# Patient Record
Sex: Male | Born: 1950 | Race: Black or African American | Hispanic: No | Smoking: Former smoker
Health system: Southern US, Community
[De-identification: ages and names within clinical notes are randomized; demographics above are authoritative.]

## PROBLEM LIST (undated history)

## (undated) DIAGNOSIS — I471 Supraventricular tachycardia, unspecified: Secondary | ICD-10-CM

## (undated) DIAGNOSIS — I6522 Occlusion and stenosis of left carotid artery: Secondary | ICD-10-CM

## (undated) DIAGNOSIS — K219 Gastro-esophageal reflux disease without esophagitis: Secondary | ICD-10-CM

## (undated) DIAGNOSIS — K56609 Unspecified intestinal obstruction, unspecified as to partial versus complete obstruction: Secondary | ICD-10-CM

## (undated) DIAGNOSIS — I252 Old myocardial infarction: Secondary | ICD-10-CM

## (undated) DIAGNOSIS — E119 Type 2 diabetes mellitus without complications: Secondary | ICD-10-CM

## (undated) DIAGNOSIS — E785 Hyperlipidemia, unspecified: Secondary | ICD-10-CM

## (undated) DIAGNOSIS — I639 Cerebral infarction, unspecified: Secondary | ICD-10-CM

## (undated) DIAGNOSIS — I739 Peripheral vascular disease, unspecified: Secondary | ICD-10-CM

## (undated) DIAGNOSIS — I2510 Atherosclerotic heart disease of native coronary artery without angina pectoris: Secondary | ICD-10-CM

## (undated) DIAGNOSIS — Z9181 History of falling: Secondary | ICD-10-CM

## (undated) DIAGNOSIS — Z87828 Personal history of other (healed) physical injury and trauma: Secondary | ICD-10-CM

## (undated) DIAGNOSIS — N433 Hydrocele, unspecified: Secondary | ICD-10-CM

## (undated) DIAGNOSIS — Z8673 Personal history of transient ischemic attack (TIA), and cerebral infarction without residual deficits: Secondary | ICD-10-CM

## (undated) DIAGNOSIS — N400 Enlarged prostate without lower urinary tract symptoms: Secondary | ICD-10-CM

## (undated) HISTORY — DX: Cerebral infarction, unspecified: I63.9

## (undated) HISTORY — DX: Hyperlipidemia, unspecified: E78.5

## (undated) HISTORY — DX: Type 2 diabetes mellitus without complications: E11.9

## (undated) HISTORY — PX: CARDIAC CATHETERIZATION: SHX172

---

## 1991-12-11 HISTORY — PX: CORONARY ANGIOPLASTY: SHX604

## 2000-01-30 ENCOUNTER — Ambulatory Visit (HOSPITAL_COMMUNITY): Admission: RE | Admit: 2000-01-30 | Discharge: 2000-01-30 | Payer: Self-pay | Admitting: Internal Medicine

## 2000-01-30 ENCOUNTER — Encounter: Payer: Self-pay | Admitting: Internal Medicine

## 2000-02-08 ENCOUNTER — Encounter: Payer: Self-pay | Admitting: Emergency Medicine

## 2000-02-08 ENCOUNTER — Emergency Department (HOSPITAL_COMMUNITY): Admission: EM | Admit: 2000-02-08 | Discharge: 2000-02-08 | Payer: Self-pay | Admitting: Emergency Medicine

## 2000-03-13 ENCOUNTER — Ambulatory Visit (HOSPITAL_COMMUNITY): Admission: RE | Admit: 2000-03-13 | Discharge: 2000-03-13 | Payer: Self-pay | Admitting: Neurosurgery

## 2000-03-13 ENCOUNTER — Encounter: Payer: Self-pay | Admitting: Neurosurgery

## 2000-04-01 ENCOUNTER — Encounter: Admission: RE | Admit: 2000-04-01 | Discharge: 2000-06-30 | Payer: Self-pay | Admitting: Neurosurgery

## 2000-06-20 ENCOUNTER — Ambulatory Visit (HOSPITAL_COMMUNITY): Admission: RE | Admit: 2000-06-20 | Discharge: 2000-06-20 | Payer: Self-pay | Admitting: *Deleted

## 2000-06-20 ENCOUNTER — Encounter: Payer: Self-pay | Admitting: *Deleted

## 2000-08-28 ENCOUNTER — Ambulatory Visit (HOSPITAL_COMMUNITY)
Admission: RE | Admit: 2000-08-28 | Discharge: 2000-08-28 | Payer: Self-pay | Admitting: Physical Medicine and Rehabilitation

## 2002-08-05 ENCOUNTER — Emergency Department (HOSPITAL_COMMUNITY): Admission: EM | Admit: 2002-08-05 | Discharge: 2002-08-05 | Payer: Self-pay | Admitting: Emergency Medicine

## 2002-12-07 ENCOUNTER — Emergency Department (HOSPITAL_COMMUNITY): Admission: EM | Admit: 2002-12-07 | Discharge: 2002-12-07 | Payer: Self-pay | Admitting: Emergency Medicine

## 2002-12-07 ENCOUNTER — Encounter: Payer: Self-pay | Admitting: Emergency Medicine

## 2002-12-10 ENCOUNTER — Emergency Department (HOSPITAL_COMMUNITY): Admission: EM | Admit: 2002-12-10 | Discharge: 2002-12-11 | Payer: Self-pay | Admitting: Emergency Medicine

## 2002-12-29 ENCOUNTER — Encounter: Payer: Self-pay | Admitting: Internal Medicine

## 2002-12-29 ENCOUNTER — Encounter: Admission: RE | Admit: 2002-12-29 | Discharge: 2002-12-29 | Payer: Self-pay | Admitting: Internal Medicine

## 2003-01-04 ENCOUNTER — Ambulatory Visit (HOSPITAL_COMMUNITY): Admission: RE | Admit: 2003-01-04 | Discharge: 2003-01-04 | Payer: Self-pay | Admitting: Surgery

## 2003-01-06 ENCOUNTER — Ambulatory Visit: Admission: AD | Admit: 2003-01-06 | Discharge: 2003-01-06 | Payer: Self-pay | Admitting: Surgery

## 2003-01-06 ENCOUNTER — Encounter: Payer: Self-pay | Admitting: Surgery

## 2003-01-15 ENCOUNTER — Inpatient Hospital Stay (HOSPITAL_COMMUNITY): Admission: EM | Admit: 2003-01-15 | Discharge: 2003-01-26 | Payer: Self-pay | Admitting: Emergency Medicine

## 2003-01-15 ENCOUNTER — Encounter: Payer: Self-pay | Admitting: Surgery

## 2003-01-15 ENCOUNTER — Encounter: Payer: Self-pay | Admitting: Emergency Medicine

## 2003-01-19 ENCOUNTER — Encounter (INDEPENDENT_AMBULATORY_CARE_PROVIDER_SITE_OTHER): Payer: Self-pay | Admitting: *Deleted

## 2003-01-19 HISTORY — PX: FEMORAL-POPLITEAL BYPASS GRAFT: SHX937

## 2003-05-23 ENCOUNTER — Emergency Department (HOSPITAL_COMMUNITY): Admission: EM | Admit: 2003-05-23 | Discharge: 2003-05-23 | Payer: Self-pay

## 2003-05-31 ENCOUNTER — Encounter (INDEPENDENT_AMBULATORY_CARE_PROVIDER_SITE_OTHER): Payer: Self-pay | Admitting: Specialist

## 2003-05-31 ENCOUNTER — Ambulatory Visit (HOSPITAL_BASED_OUTPATIENT_CLINIC_OR_DEPARTMENT_OTHER): Admission: RE | Admit: 2003-05-31 | Discharge: 2003-05-31 | Payer: Self-pay | Admitting: Urology

## 2003-05-31 HISTORY — PX: CAROTID ENDARTERECTOMY: SUR193

## 2003-05-31 HISTORY — PX: OTHER SURGICAL HISTORY: SHX169

## 2005-12-10 HISTORY — PX: CARDIOVASCULAR STRESS TEST: SHX262

## 2006-08-20 ENCOUNTER — Ambulatory Visit: Payer: Self-pay | Admitting: Cardiology

## 2006-08-20 ENCOUNTER — Inpatient Hospital Stay (HOSPITAL_COMMUNITY): Admission: EM | Admit: 2006-08-20 | Discharge: 2006-08-21 | Payer: Self-pay | Admitting: Emergency Medicine

## 2006-08-26 ENCOUNTER — Ambulatory Visit: Payer: Self-pay

## 2006-09-03 ENCOUNTER — Ambulatory Visit: Payer: Self-pay | Admitting: Cardiovascular Disease

## 2006-10-21 ENCOUNTER — Emergency Department (HOSPITAL_COMMUNITY): Admission: EM | Admit: 2006-10-21 | Discharge: 2006-10-21 | Payer: Self-pay | Admitting: Emergency Medicine

## 2006-11-20 ENCOUNTER — Ambulatory Visit: Payer: Self-pay | Admitting: Cardiology

## 2008-06-14 ENCOUNTER — Emergency Department (HOSPITAL_COMMUNITY): Admission: EM | Admit: 2008-06-14 | Discharge: 2008-06-14 | Payer: Self-pay | Admitting: Family Medicine

## 2009-04-02 ENCOUNTER — Emergency Department (HOSPITAL_COMMUNITY): Admission: EM | Admit: 2009-04-02 | Discharge: 2009-04-02 | Payer: Self-pay | Admitting: Family Medicine

## 2010-06-29 ENCOUNTER — Emergency Department (HOSPITAL_COMMUNITY): Admission: EM | Admit: 2010-06-29 | Discharge: 2010-06-29 | Payer: Self-pay | Admitting: Emergency Medicine

## 2010-07-02 ENCOUNTER — Observation Stay (HOSPITAL_COMMUNITY): Admission: EM | Admit: 2010-07-02 | Discharge: 2010-07-03 | Payer: Self-pay | Admitting: Emergency Medicine

## 2010-07-12 ENCOUNTER — Encounter: Payer: Self-pay | Admitting: Cardiology

## 2010-07-27 ENCOUNTER — Encounter: Payer: Self-pay | Admitting: Cardiology

## 2010-07-27 ENCOUNTER — Ambulatory Visit: Payer: Self-pay | Admitting: Vascular Surgery

## 2010-08-08 ENCOUNTER — Encounter: Payer: Self-pay | Admitting: Cardiology

## 2010-08-08 DIAGNOSIS — F17200 Nicotine dependence, unspecified, uncomplicated: Secondary | ICD-10-CM | POA: Insufficient documentation

## 2010-08-08 DIAGNOSIS — E785 Hyperlipidemia, unspecified: Secondary | ICD-10-CM

## 2010-08-08 DIAGNOSIS — I6529 Occlusion and stenosis of unspecified carotid artery: Secondary | ICD-10-CM

## 2010-08-08 DIAGNOSIS — I10 Essential (primary) hypertension: Secondary | ICD-10-CM

## 2010-08-09 ENCOUNTER — Ambulatory Visit: Payer: Self-pay | Admitting: Cardiology

## 2010-08-09 DIAGNOSIS — I359 Nonrheumatic aortic valve disorder, unspecified: Secondary | ICD-10-CM

## 2010-08-09 DIAGNOSIS — I739 Peripheral vascular disease, unspecified: Secondary | ICD-10-CM | POA: Insufficient documentation

## 2010-08-09 DIAGNOSIS — Z8679 Personal history of other diseases of the circulatory system: Secondary | ICD-10-CM | POA: Insufficient documentation

## 2010-08-09 DIAGNOSIS — R002 Palpitations: Secondary | ICD-10-CM | POA: Insufficient documentation

## 2010-08-16 ENCOUNTER — Emergency Department (HOSPITAL_COMMUNITY): Admission: EM | Admit: 2010-08-16 | Discharge: 2010-08-16 | Payer: Self-pay | Admitting: Emergency Medicine

## 2010-08-24 ENCOUNTER — Ambulatory Visit: Payer: Self-pay

## 2010-08-24 ENCOUNTER — Ambulatory Visit: Payer: Self-pay | Admitting: Internal Medicine

## 2010-08-24 ENCOUNTER — Ambulatory Visit (HOSPITAL_COMMUNITY): Admission: RE | Admit: 2010-08-24 | Discharge: 2010-08-24 | Payer: Self-pay | Admitting: Cardiology

## 2010-08-24 ENCOUNTER — Ambulatory Visit: Payer: Self-pay | Admitting: Cardiology

## 2010-08-24 HISTORY — PX: TRANSTHORACIC ECHOCARDIOGRAM: SHX275

## 2010-09-04 ENCOUNTER — Ambulatory Visit: Payer: Self-pay | Admitting: Cardiology

## 2010-09-08 ENCOUNTER — Encounter: Payer: Self-pay | Admitting: Cardiology

## 2010-09-08 ENCOUNTER — Telehealth: Payer: Self-pay | Admitting: Nurse Practitioner

## 2010-09-08 ENCOUNTER — Telehealth (INDEPENDENT_AMBULATORY_CARE_PROVIDER_SITE_OTHER): Payer: Self-pay | Admitting: *Deleted

## 2010-09-11 ENCOUNTER — Ambulatory Visit: Payer: Self-pay | Admitting: Internal Medicine

## 2010-09-15 ENCOUNTER — Ambulatory Visit: Payer: Self-pay | Admitting: Internal Medicine

## 2010-09-15 DIAGNOSIS — I471 Supraventricular tachycardia, unspecified: Secondary | ICD-10-CM | POA: Insufficient documentation

## 2010-09-26 ENCOUNTER — Ambulatory Visit: Payer: Self-pay | Admitting: Cardiology

## 2010-11-08 ENCOUNTER — Encounter: Payer: Self-pay | Admitting: Cardiology

## 2010-11-17 ENCOUNTER — Ambulatory Visit: Payer: Self-pay | Admitting: Internal Medicine

## 2011-01-09 NOTE — Miscellaneous (Signed)
  Clinical Lists Changes  Observations: Added new observation of CARDIO HPI: I have reviewed the echo.  It does show calcification of the right cusp with eccentric aortic insufficiency there is mild.  This explains the murmur.  There is no significant pulmonic insufficiency. (11/08/2010 11:42) Added new observation of PAST MED HX: CAD  urgent PCI  LAD.Marland Kitchen 1993  /   cath  2004...nonobstructive disease  /  nuclear 2007..small antero-apical scar..no ischemia Carotid artery disease   R-CEA in the past  /  now followed by Dr. Darrick Penna Hyperlipidemia Hypertension Tobacco abuse Keloid infected SVT  ... September, 2011.... palpitations.... regular, narrow complex, rate of 150 Aortic insufficiency   physical exam.... August, 2011 /  echo review reveals calcification of the right coronary cusp with eccentric aortic insufficiency that is mild... no significant pulmonic insufficiency Claudication   August, 2011 EF 55%.... echo.... August 24, 2010 LVH    moderate... echo... 2011 (11/08/2010 11:42) Added new observation of REFERRING MD: Allred (11/08/2010 11:42) Added new observation of PRIMARY MD: [name], MD (11/08/2010 11:42)      Referring Geisha Abernathy:  Johney Frame Primary Lillymae Duet:  [name], MD   History of Present Illness: I have reviewed the echo.  It does show calcification of the right cusp with eccentric aortic insufficiency there is mild.  This explains the murmur.  There is no significant pulmonic insufficiency.   Past History:  Past Medical History: CAD  urgent PCI  LAD.Marland Kitchen 1993  /   cath  2004...nonobstructive disease  /  nuclear 2007..small antero-apical scar..no ischemia Carotid artery disease   R-CEA in the past  /  now followed by Dr. Darrick Penna Hyperlipidemia Hypertension Tobacco abuse Keloid infected SVT  ... September, 2011.... palpitations.... regular, narrow complex, rate of 150 Aortic insufficiency   physical exam.... August, 2011 /  echo review reveals  calcification of the right coronary cusp with eccentric aortic insufficiency that is mild... no significant pulmonic insufficiency Claudication   August, 2011 EF 55%.... echo.... August 24, 2010 LVH    moderate... echo... 2011   Referring Samaiya Awadallah:  Allred Primary Melyna Huron:  [name], MD   History of Present Illness: I have reviewed the echo.  It does show calcification of the right cusp with eccentric aortic insufficiency there is mild.  This explains the murmur.  There is no significant pulmonic insufficiency.

## 2011-01-09 NOTE — Procedures (Signed)
Summary: Summary Report  Summary Report   Imported By: Erle Crocker 10/27/2010 16:07:31  _____________________________________________________________________  External Attachment:    Type:   Image     Comment:   External Document

## 2011-01-09 NOTE — Assessment & Plan Note (Signed)
Summary: [facility]   Referring Provider:  Fabienne Bruns, MD Primary Provider:  [name], MD   History of Present Illness: The patient was in the office today for two-dimensional echo.  As he was lying on the table for the echo his heart rate was 141.  EKG was done showing a regular supraventricular tachycardia at a rate of 141.  With very mild left carotid massage the patient broke to sinus rhythm.  I was unable to record the instance when the rhythm broke.  Currently the echo is being done.  Unless the patient has unexpected significant left ventricular dysfunction, he will be started on diltiazem long-acting 180 mg daily.  Allergies: No Known Drug Allergies   Patient Instructions: 1)  Start Diltiazem CD 180mg  daily Prescriptions: DILT-CD 180 MG XR24H-CAP (DILTIAZEM HCL COATED BEADS) Take 1 tablet by mouth once a day  #30 x 6   Entered by:   Meredith Staggers, RN   Authorized by:   Talitha Givens, MD, [facility]   Signed by:   Meredith Staggers, RN on 08/24/2010   Method used:   Electronically to        [facility]. [phone]* (retail)       [address]       [place], [place]  [address]       Ph: [phone]       Fax: [phone]   RxID:   [id number]

## 2011-01-09 NOTE — Assessment & Plan Note (Signed)
Summary: 4 WKS/Nicholas [name]   Visit Type:  Follow-up Referring Provider:  Fabienne Bruns, MD Primary Provider:  [name], MD  CC:  palpitations.  History of Present Illness: The patient is seen for the evaluation of palpitations.  He has known coronary disease.  I saw him in the office on August 09, 2010.  Plan at that time was to arrange for a two-dimensional echo and an event recorder.  He came into the office on August 24, 2010.  To have his echo and have the monitor placed.  As he was positioned for the echo it was noted that he had a heart rate of 150 with a regular narrow complex QRS tachycardia.  Fortunately this converted rapidly with carotid massage.  He was on the echo monitor.  I was not able to record to conversion.  No definite flutter waves were seen.  We did proceed with a 2-D echo.  The event recorder was placed.  Cardizem CD 180 mg was added to his medications.  He's felt well since then.  He been wearing the event recorder.  He had further SVT on the day that I saw him and then with Cardizem no other SVT is seen until today.  He tells me that he became very upset when he couldn't find some papers earlier today.  He is in sinus rhythm now.  Current Medications (verified): 1)  Aspirin Ec 325 Mg Tbec (Aspirin) .... Take One Tablet By Mouth Daily 2)  Pravastatin Sodium 20 Mg Tabs (Pravastatin Sodium) .... Take One Tablet By Mouth Daily At Bedtime 3)  Metoprolol Tartrate 25 Mg Tabs (Metoprolol Tartrate) .... Take One Tablet By Mouth Twice A Day 4)  Dilt-Cd 180 Mg Xr24h-Cap (Diltiazem Hcl Coated Beads) .... Take 1 Tablet By Mouth Once A Day  Allergies (verified): No Known Drug Allergies  Past History:  Past Medical History: CAD  urgent PCI  LAD.Marland Kitchen 1993  /   cath  2004...nonobstructive disease  /  nuclear 2007..small antero-apical scar..no ischemia Carotid artery disease   R-CEA in the past  /  now followed by Dr. Darrick Penna Hyperlipidemia Hypertension Tobacco abuse Keloid  infected SVT  ... September, 2011.... palpitations.... regular, narrow complex, rate of 150 Aortic insufficiency   physical exam.... August, 2011 Claudication   August, 2011 EF 55%.... echo.... August 24, 2010 LVH    moderate... echo... 2011  Review of Systems       Patient denies fever, chills, headache, sweats, rash, change in vision, change in hearing chest pain, cough, nausea vomiting, urinary symptoms.  All other systems are reviewed and are negative  Vital Signs:  Patient profile:   60 year old male Height:      71 inches Weight:      254 pounds BMI:     35.55 Pulse rate:   80 / minute BP sitting:   128 / 60  (left arm) Cuff size:   regular  Vitals Entered By: Hardin Negus, RMA (September 04, 2010 3:55 PM)  Physical Exam  General:  patient is overweight but stable. Head:  head is atraumatic. Eyes:  no xanthelasma. Neck:  no jugular distention. patient has a large keloid in his right neck from his vascular surgery. Chest Wall:  no chest wall tenderness. Lungs:  lungs are clear respiratory effort is nonlabored. Heart:  cardiac exam reveals S1-S2.  There is a soft diastolic murmur heard Abdomen:  abdomen soft. Msk:  no musculoskeletal deformities. Extremities:  no peripheral edema. Skin:  no skin rashes.  Psych:  patient is oriented to person time and place.  Affect is normal.   Impression & Recommendations:  Problem # 1:  PALPITATIONS, HX OF (ICD-V12.50) Is very clear that the patient's palpitations are from his supraventricular tachycardia.  Cardizem has helped, but he had some today when he became upset.  He is guarding on low dose beta blocker and Cardizem.  I will let digoxin today.  Problem # 2:  AORTIC INSUFFICIENCY (ICD-424.1)  His updated medication list for this problem includes:    Metoprolol Tartrate 25 Mg Tabs (Metoprolol tartrate) .Marland Kitchen... Take one tablet by mouth twice a day    Digoxin 0.25 Mg Tabs (Digoxin) .Marland Kitchen... Take one tablet by mouth  daily Aortic insufficiency is not mentioned in the catheter report.  I will rereview the study.  He has a definite diastolic murmur.  Problem # 3:  TOBACCO ABUSE (ICD-305.1) Fortunately he is not smoking.  Problem # 4:  HYPERTENSION, UNSPECIFIED (ICD-401.9)  His updated medication list for this problem includes:    Aspirin Ec 325 Mg Tbec (Aspirin) .Marland Kitchen... Take one tablet by mouth daily    Metoprolol Tartrate 25 Mg Tabs (Metoprolol tartrate) .Marland Kitchen... Take one tablet by mouth twice a day    Dilt-cd 180 Mg Xr24h-cap (Diltiazem hcl coated beads) .Marland Kitchen... Take 1 tablet by mouth once a day Blood pressure is under good control today.  No change in therapy.  Problem # 5:  CAD, NATIVE VESSEL (ICD-414.01)  His updated medication list for this problem includes:    Aspirin Ec 325 Mg Tbec (Aspirin) .Marland Kitchen... Take one tablet by mouth daily    Metoprolol Tartrate 25 Mg Tabs (Metoprolol tartrate) .Marland Kitchen... Take one tablet by mouth twice a day    Dilt-cd 180 Mg Xr24h-cap (Diltiazem hcl coated beads) .Marland Kitchen... Take 1 tablet by mouth once a day Coronary disease is stable.  Patient had a two-dimensional echo.  I reviewed the report completely.  Ejection fraction 55%.  There is moderate left ventricular hypertrophy.  There is calcification of one of the costs of aortic valve but no significant aortic stenosis.  We will continue to treat his supraventricular arrhythmia.  I will then decide over time if we should do further testing related to his coronary disease.  Patient Instructions: 1)  Start Digoxin 0.25mg , take 1 tab tonight, 3 tabs spaced out throughout day tom. and then 1 tab daily until your next appointment. 2)  Follow up in 3 weeks Prescriptions: DIGOXIN 0.25 MG TABS (DIGOXIN) Take one tablet by mouth daily  #35 x 6   Entered by:   Meredith Staggers, RN   Authorized by:   Talitha Givens, MD, [facility]   Signed by:   Meredith Staggers, RN on 09/04/2010   Method used:   Electronically to        [facility]. [phone]*  (retail)       [address]       [place], [place]  [address]       Ph: [phone]       Fax: [phone]   RxID:   [id number]   Appended Document: 4 WKS/Nicholas [name] I have reviewed the echocardiogram.  The report had mentioned that the patient does have calcification of the right coronary cusp.  There was no mention in the report of aortic insufficiency.  However the patient does have aortic insufficiency.  It is somewhat eccentric.  Overall it is probably mild.  This does explain  the murmur that is heard.  There is no significant pulmonic insufficiency

## 2011-01-09 NOTE — Progress Notes (Signed)
Summary: Cardiology Phone Note - Medication ?  Phone Note Other Incoming Call back at dtr's cell: [phone]   Caller: dtr Summary of Call: received call from mr. Dohse's dtr, daphne, stating that they were unclear as to which medication they were to increase.  i reviewed records from earlier today and advised that he is to increase his metoprolol to 50mg  two times a day.  he had actually just run out of that and so i called his [facility] pharmacy on [address] - 60 tabs, 6 refills. Initial call taken by: Creig Hines, ANP-BC,  September 08, 2010 6:53 PM

## 2011-01-09 NOTE — Assessment & Plan Note (Signed)
Summary: eval for flutter per dr Jesusita Oka ok per kelly/sl   Visit Type:  Follow-up Referring Nicholas [name]:  Dr Nicholas [name] Primary Nicholas [name]:  Nicholas Beath, MD   History of Present Illness: Mr Nicholas [name] is a 60 yo AAM with h/o  CAD s/p PCI LAD 1993 and SVT who presents today for EP consultation.  He reports episodes of tachypalpitations over the past 2-3 months.  He describes gradual onset and offset of palpitations without associated symptoms.  Episodes are often brought on by spicy food or stetching his arm above his head.  He feels that these episodes last 15 minutes and occur 1-2 times per week.  He finds that if he takes asprin and lies down that episodes terminate.  The patient has known coronary disease.  He had urgent PCI to the LAD in 1993.  Catheterization in 2004 showed nonobstructive disease.  A nuclear scan in 2007 revealed small anteroapical scar but no ischemia  About 1 month ago seen by Dr. Myrtis [name] who heard a diastolic murmur. Referred for echo which showed normal EF and no mention of AI. At that visit found to be in an SVT which broke with carotid massage. Started on diltiazem and monitor placed. On monitor multiple episodes of SVT @ 150. This was reviewed by Dr. Juanda [name] and patient started on lopressor and digoxin. The patient denies CP, sob or syncope. No HF symptoms.  Current Medications (verified): 1)  Aspirin Ec 325 Mg Tbec (Aspirin) .... Take One Tablet By Mouth Daily 2)  Pravastatin Sodium 20 Mg Tabs (Pravastatin Sodium) .... Take One Tablet By Mouth Daily At Bedtime 3)  Metoprolol Tartrate 50 Mg Tabs (Metoprolol Tartrate) .Marland Kitchen.. 1 Tab Two Times A Day 4)  Dilt-Cd 180 Mg Xr24h-Cap (Diltiazem Hcl Coated Beads) .... Take 1 Tablet By Mouth Once A Day 5)  Digoxin 0.25 Mg Tabs (Digoxin) .... Take One Tablet By Mouth Daily  Allergies (verified): No Known Drug Allergies  Past History:  Past Medical History: Reviewed history from 09/04/2010 and no changes required. CAD  urgent PCI   LAD.Marland Kitchen 1993  /   cath  2004...nonobstructive disease  /  nuclear 2007..small antero-apical scar..no ischemia Carotid artery disease   R-CEA in the past  /  now followed by Dr. Darrick [name] Hyperlipidemia Hypertension Tobacco abuse Keloid infected SVT  ... September, 2011.... palpitations.... regular, narrow complex, rate of 150 Aortic insufficiency   physical exam.... August, 2011 Claudication   August, 2011 EF 55%.... echo.... August 24, 2010 LVH    moderate... echo... 2011  Past Surgical History: Reviewed history from 08/08/2010 and no changes required. right hydrocelectomy on 05/31/2003.  Carotid Endarterectomy -- Right left femoral popliteal bypass on 01/19/2003.  Family History: Reviewed history from 08/09/2010 and no changes required. Heart Failure Family History of Cancer:  Family History of CVA or Stroke:   Social History: Single and lives in [place].  On disability due to backpain. Alcohol Use - no Tobacco Use - Former.  Regular Exercise - yes Drug Use - no  Review of Systems       All systems are reviewed and negative except as listed in the HPI.   Vital Signs:  Patient profile:   60 year old male Height:      71 inches Weight:      253 pounds BMI:     35.41 Pulse rate:   68 / minute BP sitting:   138 / 68  (left arm)  Vitals Entered By: Laurance Flatten CMA (September 15, 2010 11:35  AM)  Physical Exam  General:  Well developed, well nourished, in no acute distress. Head:  normocephalic and atraumatic Eyes:  PERRLA/EOM intact; conjunctiva and lids normal. Mouth:  Teeth, gums and palate normal. Oral mucosa normal. Neck:  no jugular distention. patient has a large keloid in his right neck from his vascular surgery. Lungs:  Clear bilaterally to auscultation and percussion. Heart:  RRR, There is a soft diastolic murmur heard LSB Abdomen:  Bowel sounds positive; abdomen soft and non-tender without masses, organomegaly, or hernias noted. No hepatosplenomegaly. Msk:   Back normal, normal gait. Muscle strength and tone normal. Pulses:  pulses normal in all 4 extremities Extremities:  1+ RLE peripheral edema. no cyanosis or clubbing Neurologic:  Alert and oriented x 3. Skin:  Intact without lesions or rashes. Psych:  aggitated witha flat affect   Echocardiogram  Procedure date:  08/24/2010  Findings:        Study Conclusions    - Left ventricle: The cavity size was normal. There was moderate     concentric hypertrophy. The estimated ejection fraction was 55%.     Features are consistent with a pseudonormal left ventricular     filling pattern, with concomitant abnormal relaxation and     increased filling pressure (grade 2 diastolic dysfunction).   - Aortic valve: Heavy calcification of the right coronary cusp of     the aortic valve.   - Left atrium: The atrium was mildly dilated.   - Atrial septum: There was increased thickness of the septum,     consistent with lipomatous hypertrophy. The septum bowed from left     to right, consistent with increased left atrial pressure.  Prepared and Electronically Authenticated by    Nicholas Mango, MD    Impression & Recommendations:  Problem # 1:  PSVT (ICD-427.0) The patient has well documented narrow complex SVT (mid RP).  I have reviewed his recent event monitor which documents frequent SVT at 150 bpm.  Therapeutic strategies for SVT including medicine and ablation were discussed in detail with the patient today. Risk, benefits, and alternatives to EP study and radiofrequency ablation were also discussed in detail today. These risks include but are not limited to stroke, bleeding, vascular damage, tamponade, perforation, damage to the heart other structures, pacemaker dependance, worsening renal function, and death. The patient understands these risk and wishes to avoid ablation at this time. We will therefore increase cardizem to 360mg  daily.  I have provided with informational packet on SVT and  ablation.  He will return for further discussion in 2 months.  Problem # 2:  HYPERTENSION, UNSPECIFIED (ICD-401.9) increase cardizem as above  Problem # 3:  CAD, NATIVE VESSEL (ICD-414.01) stable without symptoms of ischemia  Problem # 4:  HYPERLIPIDEMIA-MIXED (ICD-272.4) stable His updated medication list for this problem includes:    Pravastatin Sodium 20 Mg Tabs (Pravastatin sodium) .Marland Kitchen... Take one tablet by mouth daily at bedtime  Patient Instructions: 1)  Your physician recommends that you schedule a follow-up appointment in: 2 months 2)  Your physician has recommended you make the following change in your medication: Cardizem 360mg  daily. Prescriptions: DILTIAZEM HCL ER BEADS 360 MG XR24H-CAP (DILTIAZEM HCL ER BEADS) Take one capsule by mouth daily  #90 x 3   Entered by:   Claris Gladden RN   Authorized by:   Hillis Range, MD   Signed by:   Claris Gladden RN on 09/15/2010   Method used:   Electronically to  [facility] [address] [phone]* (retail)       [address]       [place], [place]  [address]       Ph: [phone]       Fax: [phone]   RxID:   [id number]

## 2011-01-09 NOTE — Miscellaneous (Signed)
  Clinical Lists Changes  Problems: Added new problem of CAROTID ARTERY DISEASE (ICD-433.10) Added new problem of TOBACCO ABUSE (ICD-305.1) Observations: Added new observation of PAST MED HX: CAD  urgent PCI  LAD.Marland Kitchen 1993  /   cath  2004...nonobstructive disease  /  nuclear 2007..small antero-apical scar..no ischemia Carotid artery disease   R-CEA Hyperlipidemia Hypertension Tobacco abuse Keloid infected  (08/08/2010 20:15)       Past History:  Past Medical History: CAD  urgent PCI  LAD.Marland Kitchen 1993  /   cath  2004...nonobstructive disease  /  nuclear 2007..small antero-apical scar..no ischemia Carotid artery disease   R-CEA Hyperlipidemia Hypertension Tobacco abuse Keloid infected

## 2011-01-09 NOTE — Assessment & Plan Note (Signed)
Summary: np6/dx:chest pain pulse 144/lg/ dod call on 8/29 o.k. per hea...   Visit Type:  Initial Consult Referring Provider:  Fabienne Bruns, MD Primary Provider:  [name], MD  CC:  murmur and CAD.  History of Present Illness: The patient is seen for assessment of his coronary disease and cardiac murmur. The patient has known coronary disease.  He had urgent PCI to the LAD in 1993.  Catheterization in 2004 showed nonobstructive disease.  A nuclear scan in 2007 revealed small anteroapical scar but no ischemia.  He has had some mild chest discomfort.  It is limited and not exertional.  Patient complains of some palpitations.  He feels this sensation at least once a week and it can last as long as 15 minutes.  He has not had any syncope or presyncope.  Patient also has an ongoing problem with a keloid in the right neck after a carotid endarterectomy in the remote past.  This may well require a surgical intervention.  Preventive Screening-Counseling & Management  Caffeine-Diet-Exercise     Does Patient Exercise: yes      Drug Use:  no.    Current Medications (verified): 1)  Aspirin Ec 325 Mg Tbec (Aspirin) .... Take One Tablet By Mouth Daily 2)  Pravastatin Sodium 20 Mg Tabs (Pravastatin Sodium) .... Take One Tablet By Mouth Daily At Bedtime 3)  Metoprolol Tartrate 25 Mg Tabs (Metoprolol Tartrate) .... Take One Tablet By Mouth Twice A Day  Allergies (verified): No Known Drug Allergies  Past History:  Past Medical History: CAD  urgent PCI  LAD.Marland Kitchen 1993  /   cath  2004...nonobstructive disease  /  nuclear 2007..small antero-apical scar..no ischemia Carotid artery disease   R-CEA in the past  /  now followed by Dr. Darrick Penna Hyperlipidemia Hypertension Tobacco abuse Keloid infected palpitations   August, 2011 Aortic insufficiency   physical exam.... August, 2011 Claudication   August, 2011  Family History: Heart Failure Family History of Cancer:  Family History of CVA or  Stroke:   Social History: Single  Alcohol Use - no Tobacco Use - Former.  Regular Exercise - yes Drug Use - no Does Patient Exercise:  yes Drug Use:  no  Review of Systems       Patient denies fever, chills, headache, sweats, rash, change in vision, change in hearing, chest pain, cough, nausea vomiting, urinary symptoms.  All other systems are reviewed and are negative.  Vital Signs:  Patient profile:   60 year old male Height:      71 inches Weight:      258 pounds BMI:     36.11 Pulse rate:   82 / minute BP sitting:   132 / 74  (left arm) Cuff size:   large  Vitals Entered By: Hardin Negus, RMA (August 09, 2010 3:04 PM)  Physical Exam  General:  The patient is overweight but stable. Head:  head is atraumatic. Eyes:  no xanthelasma. Neck:  there is a large keloid in the right neck post carotid endarterectomy. Chest Wall:  no chest wall tenderness. Lungs:  lungs are clear.  Respiratory effort is not labored. Heart:  cardiac exam reveals S1-S2.  There is a 3/6 murmur of aortic insufficiency. Abdomen:  abdomen is soft. Msk:  no musculoskeletal deformities. Extremities:  no peripheral edema. Skin:  no skin rashes. Psych:  patient is oriented to person time and place.  Affect is normal.   Impression & Recommendations:  Problem # 1:  PALPITATIONS, HX OF (  ICD-V12.50) The patient has not had syncope or presyncope.  We will proceed with an event recorder to try to document any arrhythmia.  He has these episodes approximately once per week.  A three-week recorder will be needed.  Problem # 2:  AORTIC INSUFFICIENCY (ICD-424.1)  His updated medication list for this problem includes:    Metoprolol Tartrate 25 Mg Tabs (Metoprolol tartrate) .Marland Kitchen... Take one tablet by mouth twice a day  Orders: Echocardiogram (Echo) patient has a definite murmur of aortic insufficiency.  Echo will be done for further assessment.  This has been scheduled.  Problem # 3:  TOBACCO ABUSE  (ICD-305.1) The patient is not currently smoking.  Problem # 4:  CAROTID ARTERY DISEASE (ICD-433.10)  His updated medication list for this problem includes:    Aspirin Ec 325 Mg Tbec (Aspirin) .Marland Kitchen... Take one tablet by mouth daily The patient had a right carotid endarterectomy in the past.  He is followed now by Dr.Fields.  Problem # 5:  HYPERTENSION, UNSPECIFIED (ICD-401.9)  His updated medication list for this problem includes:    Aspirin Ec 325 Mg Tbec (Aspirin) .Marland Kitchen... Take one tablet by mouth daily    Metoprolol Tartrate 25 Mg Tabs (Metoprolol tartrate) .Marland Kitchen... Take one tablet by mouth twice a day Blood pressure is under control at this time.  No change in therapy.  Problem # 6:  HYPERLIPIDEMIA-MIXED (ICD-272.4)  His updated medication list for this problem includes:    Pravastatin Sodium 20 Mg Tabs (Pravastatin sodium) .Marland Kitchen... Take one tablet by mouth daily at bedtime Lipids are being treated.  Problem # 7:  CAD, NATIVE VESSEL (ICD-414.01)  His updated medication list for this problem includes:    Aspirin Ec 325 Mg Tbec (Aspirin) .Marland Kitchen... Take one tablet by mouth daily    Metoprolol Tartrate 25 Mg Tabs (Metoprolol tartrate) .Marland Kitchen... Take one tablet by mouth twice a day  Orders: EKG w/ Interpretation (93000) chest the patient has vague chest pain.  He has known coronary disease.  EKG is done and reviewed by me.  I have compared to a tracing of 2007.  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  There is also some inferior ST elevation that has been present in the past.  There is normal sinus rhythm.  At this time he is not getting symptoms of significant ischemia.  At a later date we will consider whether exercise testing is needed.  As part of the evaluation today I have reviewed information sent from Dr.Fields' office.  I have reviewed also a note from our office from 2007.   Problem # 8:  CLAUDICATION (ICD-443.9) Dr. Darrick Penna will be assessing this  over time.  Other Orders: Event (Event)  Patient Instructions: 1)  Your physician recommends that you schedule a follow-up appointment in: 4 weeks. 2)  Your physician has recommended that you wear an event monitor.  Event monitors are medical devices that record the heart's electrical activity. Doctors most often use these monitors to diagnose arrhythmias. Arrhythmias are problems with the speed or rhythm of the heartbeat. The monitor is a small, portable device. You can wear one while you do your normal daily activities. This is usually used to diagnose what is causing palpitations/syncope (passing out). 3)  Your physician has requested that you have an echocardiogram.  Echocardiography is a painless test that uses sound waves to create images of your heart. It provides your doctor with information about the size and shape  of your heart and how well your heart's chambers and valves are working.  This procedure takes approximately one hour. There are no restrictions for this procedure. 4)  Your physician recommends that you continue on your current medications as directed. Please refer to the Current Medication list given to you today.

## 2011-01-09 NOTE — Progress Notes (Signed)
     Follow-up for Phone Call       Follow-up by: Lisabeth Devoid RN,  September 08, 2010 5:35 PM    Additional Follow-up for Phone Call Additional follow up Details #2::    Dr. [name] spoke with [name] regarding lifewatch rhythm report --- rapid atrial flutter/afib hr 150. Metoprolol tartrate increased to 50mg  two times a day. Also to see DOD Monday 09/11/10 per Dr. [name]. Pt advised to go to ED if condition changes or worsens.  At this time pt is not experiencing any s/s. Mylo Red RN  New/Updated Medications: METOPROLOL TARTRATE 25 MG TABS (METOPROLOL TARTRATE) Take two  tablets  by mouth twice a day

## 2011-01-09 NOTE — Assessment & Plan Note (Signed)
Summary: per check out/sf   Visit Type:  Follow-up Referring Ronnisha Felber:  Allred Primary Srijan Givan:  [name], MD  CC:  palpitations.  History of Present Illness: The patient is seen for followup of supraventricular tachycardia.  I saw him last September 04, 2010.  He was seen by Dr. Johney Frame for electrophysiology evaluation.  It was felt that the patient does have some ventricular tachycardia and.  It was felt that ablation could be performed.  The patient was considering this.  His diltiazem dose was increased to 360 mg daily.  He has not been having any significant palpitations or chest pain.  Current Medications (verified): 1)  Aspirin Ec 325 Mg Tbec (Aspirin) .... Take One Tablet By Mouth Daily 2)  Pravastatin Sodium 20 Mg Tabs (Pravastatin Sodium) .... Take One Tablet By Mouth Daily At Bedtime 3)  Metoprolol Tartrate 50 Mg Tabs (Metoprolol Tartrate) .Marland Kitchen.. 1 Tab Two Times A Day 4)  Diltiazem Hcl Er Beads 360 Mg Xr24h-Cap (Diltiazem Hcl Er Beads) .... Take One Capsule By Mouth Daily 5)  Digoxin 0.25 Mg Tabs (Digoxin) .... Take One Tablet By Mouth Daily  Allergies (verified): No Known Drug Allergies  Past History:  Past Medical History: CAD  urgent PCI  LAD.Marland Kitchen 1993  /   cath  2004...nonobstructive disease  /  nuclear 2007..small antero-apical scar..no ischemia Carotid artery disease   R-CEA in the past  /  now followed by Dr. Darrick Penna Hyperlipidemia Hypertension Tobacco abuse Keloid infected SVT  ... September, 2011.... palpitations.... regular, narrow complex, rate of 150 Aortic insufficiency   physical exam.... August, 2011 Claudication   August, 2011 EF 55%.... echo.... August 24, 2010 [facility]    moderate... echo... 2011  Review of Systems       Patient denies fever, chills, headache, sweats, rash, change in vision, change in hearing, chest pain, cough, nausea vomiting, urinary symptoms.  All of the systems are reviewed and are negative.  Vital Signs:  Patient profile:    60 year old male Height:      71 inches Weight:      256 pounds BMI:     35.83 Pulse rate:   65 / minute BP sitting:   118 / 62  (left arm) Cuff size:   large  Vitals Entered By: Hardin Negus, RMA (September 26, 2010 2:30 PM)  Physical Exam  General:  patient is stable today. Eyes:  no xanthelasma. Neck:  no jugular venous stent. Lungs:  lungs are clear.  Respiratory effort is nonlabored. Heart:  cardiac exam reveals S1-S2.  There is a diastolic murmur that has been heard in the past Abdomen:  abdomen is soft. Extremities:  no peripheral edema. Psych:  patient is oriented to person time and place.  Affect is normal.   Impression & Recommendations:  Problem # 1:  PSVT (ICD-427.0)  His updated medication list for this problem includes:    Aspirin Ec 325 Mg Tbec (Aspirin) .Marland Kitchen... Take one tablet by mouth daily    Metoprolol Tartrate 50 Mg Tabs (Metoprolol tartrate) .Marland Kitchen... 1 tab two times a day    Diltiazem Hcl Er Beads 360 Mg Xr24h-cap (Diltiazem hcl er beads) .Marland Kitchen... Take one capsule by mouth daily For now the patient is treated with meds.  Digoxin will be checked.  Orders: T-Digoxin ([id number])  Problem # 2:  TOBACCO ABUSE (ICD-305.1) Patient is not smoking.  Problem # 3:  CAD, NATIVE VESSEL (ICD-414.01)  His updated medication list for this problem includes:    Aspirin Ec  325 Mg Tbec (Aspirin) .Marland Kitchen... Take one tablet by mouth daily    Metoprolol Tartrate 50 Mg Tabs (Metoprolol tartrate) .Marland Kitchen... 1 tab two times a day    Diltiazem Hcl Er Beads 360 Mg Xr24h-cap (Diltiazem hcl er beads) .Marland Kitchen... Take one capsule by mouth daily Coronary disease is stable.  No further workup.  Problem # 4:  HYPERLIPIDEMIA-MIXED (ICD-272.4)  His updated medication list for this problem includes:    Pravastatin Sodium 20 Mg Tabs (Pravastatin sodium) .Marland Kitchen... Take one tablet by mouth daily at bedtime The patient is to remain on Pravachol.  Patient Instructions: 1)  Lab today 2)  Your physician wants  you to follow-up in:  6 months.  You will receive a reminder letter in the mail two months in advance. If you don't receive a letter, please call our office to schedule the follow-up appointment.

## 2011-01-09 NOTE — Letter (Signed)
Summary: [facility]   Imported By: Marylou Mccoy 10/10/2010 14:57:55  _____________________________________________________________________  External Attachment:    Type:   Image     Comment:   External Document

## 2011-01-09 NOTE — Assessment & Plan Note (Signed)
Summary: rov/dfg   Visit Type:  f/u Referring Zetha Kuhar:  Fabienne Bruns, MD Primary Darienne Belleau:  [name], MD  CC:  edema/feet.....Marland Kitchen  History of Present Illness: Nicholas [name] is a 60 y/o male with h/o coronary disease and cardiac murmur. The patient has known coronary disease.  He had urgent PCI to the LAD in 1993.  Catheterization in 2004 showed nonobstructive disease.  A nuclear scan in 2007 revealed small anteroapical scar but no ischemia  About 1 month ago seen by Dr. Myrtis Ser who heard a diastolic murmur. Referred for echo which showed normal EF and no mention of AI. At that visit found to be in an SVT which broke with carotid massage. Started on diltiazem and monitor placed. On monitor multiple episodes of SVT @ 150. This was reviewed by Dr. [name] and patient started on lopressor and scheduled to see me today as DOD.   Continues to have daily palpitations. Last about 5- 15 mins. no CP, sob or syncope. No HF symptoms. No clear triggers to palpitations.  Current Medications (verified): 1)  Aspirin Ec 325 Mg Tbec (Aspirin) .... Take One Tablet By Mouth Daily 2)  Pravastatin Sodium 20 Mg Tabs (Pravastatin Sodium) .... Take One Tablet By Mouth Daily At Bedtime 3)  Metoprolol Tartrate 50 Mg Tabs (Metoprolol Tartrate) .Marland Kitchen.. 1 Tab Two Times A Day 4)  Dilt-Cd 180 Mg Xr24h-Cap (Diltiazem Hcl Coated Beads) .... Take 1 Tablet By Mouth Once A Day 5)  Digoxin 0.25 Mg Tabs (Digoxin) .... Take One Tablet By Mouth Daily  Allergies (verified): No Known Drug Allergies  Past History:  Past Medical History: Last updated: 09/04/2010 CAD  urgent PCI  LAD.Marland Kitchen 1993  /   cath  2004...nonobstructive disease  /  nuclear 2007..small antero-apical scar..no ischemia Carotid artery disease   R-CEA in the past  /  now followed by Dr. Darrick Penna Hyperlipidemia Hypertension Tobacco abuse Keloid infected SVT  ... September, 2011.... palpitations.... regular, narrow complex, rate of 150 Aortic insufficiency    physical exam.... August, 2011 Claudication   August, 2011 EF 55%.... echo.... August 24, 2010 [facility]    moderate... echo... 2011  Review of Systems       As per HPI and past medical history; otherwise all systems negative.   Vital Signs:  Patient profile:   60 year old male Height:      71 inches Weight:      256 pounds BMI:     35.83 Pulse rate:   72 / minute Pulse rhythm:   regular BP sitting:   140 / 62  (left arm) Cuff size:   large  Vitals Entered By: Marrion Coy, CNA (September 11, 2010 4:39 PM)  Physical Exam  General:  patient is overweight but stable. Head:  head is atraumatic. Eyes:  no xanthelasma. Neck:  no jugular distention. patient has a large keloid in his right neck from his vascular surgery. Chest Wall:  no chest wall tenderness. Lungs:  lungs are clear respiratory effort is nonlabored. Heart:  cardiac exam reveals S1-S2.  There is a soft diastolic murmur heard Abdomen:  abdomen soft. Extremities:  no peripheral edema. no cyanosis or clubbing Neurologic:  Cranial nerves grossly intact. Walks with cane   Impression & Recommendations:  Problem # 1:  PALPITATIONS (ICD-785.1) I reviewed his monitor with him and he continues to have recurrent episodes of SVT at 150. I question whether this is an atriacl tach or atypical flutter. Will refer to EP to help decide if he  is candidate for ablation or whether we should use anti-arrhythmic agent.   Other Orders: EP Referral (Cardiology EP Ref )  Patient Instructions: 1)  You have been referred to EP for possible ablation

## 2011-01-11 NOTE — Assessment & Plan Note (Signed)
Summary: 2 month rov.sl   Visit Type:  Follow-up Referring Provider:  Myrtis Ser Primary Provider:  [name], MD   History of Present Illness: The patient presents today for routine electrophysiology followup. He reports doing very well since last being seen in our clinic.  He denies any further episodes of SVT.  He remains active and without complaint.  The patient denies symptoms of palpitations, chest pain, shortness of breath, orthopnea, PND,  dizziness, presyncope, syncope, or neurologic sequela. The patient is tolerating medications without difficulties and is otherwise without complaint today.   Current Medications (verified): 1)  Aspirin Ec 325 Mg Tbec (Aspirin) .... Take One Tablet By Mouth Daily 2)  Metoprolol Tartrate 50 Mg Tabs (Metoprolol Tartrate) .Marland Kitchen.. 1 Tab Two Times A Day 3)  Diltiazem Hcl Er Beads 360 Mg Xr24h-Cap (Diltiazem Hcl Er Beads) .... Take One Capsule By Mouth Daily 4)  Digoxin 0.25 Mg Tabs (Digoxin) .... Take One Tablet By Mouth Daily  Allergies (verified): No Known Drug Allergies  Past History:  Past Medical History: Reviewed history from 11/08/2010 and no changes required. CAD  urgent PCI  LAD.Marland Kitchen 1993  /   cath  2004...nonobstructive disease  /  nuclear 2007..small antero-apical scar..no ischemia Carotid artery disease   R-CEA in the past  /  now followed by Dr. Darrick Penna Hyperlipidemia Hypertension Tobacco abuse Keloid infected SVT  ... September, 2011.... palpitations.... regular, narrow complex, rate of 150 Aortic insufficiency   physical exam.... August, 2011 /  echo review reveals calcification of the right coronary cusp with eccentric aortic insufficiency that is mild... no significant pulmonic insufficiency Claudication   August, 2011 EF 55%.... echo.... August 24, 2010 [facility]    moderate... echo... 2011  Past Surgical History: Reviewed history from 08/08/2010 and no changes required. right hydrocelectomy on 05/31/2003.  Carotid Endarterectomy  -- Right left femoral popliteal bypass on 01/19/2003.  Social History: Reviewed history from 09/15/2010 and no changes required. Single and lives in [place].  On disability due to backpain. Alcohol Use - no Tobacco Use - Former.  Regular Exercise - yes Drug Use - no  Review of Systems       All systems are reviewed and negative except as listed in the HPI.   Vital Signs:  Patient profile:   60 year old male Height:      71 inches Weight:      266 pounds BMI:     37.23 Pulse rate:   62 / minute BP sitting:   138 / 72  (left arm)  Vitals Entered By: Laurance Flatten CMA (November 17, 2010 4:08 PM)  Physical Exam  General:  Well developed, well nourished, in no acute distress. Head:  normocephalic and atraumatic Eyes:  PERRLA/EOM intact; conjunctiva and lids normal. Mouth:  Teeth, gums and palate normal. Oral mucosa normal. Neck:  supple Lungs:  Clear Heart:  RRR, 2/6 diastolic murmur LUSB Abdomen:  Bowel sounds positive; abdomen soft and non-tender without masses, organomegaly, or hernias noted. No hepatosplenomegaly. Msk:  Back normal, normal gait. Muscle strength and tone normal. Extremities:  no peripheral edema. Neurologic:  Alert and oriented x 3.   Impression & Recommendations:  Problem # 1:  PSVT (ICD-427.0) presently controlled he does not wish to persue EP study at this time no changes are made today he will follow up with Dr Myrtis Ser and return to see me if his SVT progresses or he wishes to reconsider ablation  Problem # 2:  HYPERTENSION, UNSPECIFIED (ICD-401.9) stable  Problem #  3:  CAD, NATIVE VESSEL (ICD-414.01) no symptoms of ischemia  Patient Instructions: 1)  Your physician recommends that you schedule a follow-up appointment as needed with Dr Johney Frame

## 2011-01-12 NOTE — Letter (Signed)
Summary: [facility] Office Visit Note   [facility] Office Visit Note   Imported By: Roderic Ovens 08/24/2010 11:14:48  _____________________________________________________________________  External Attachment:    Type:   Image     Comment:   External Document

## 2011-02-01 ENCOUNTER — Other Ambulatory Visit: Payer: Self-pay

## 2011-02-01 ENCOUNTER — Ambulatory Visit: Payer: Self-pay | Admitting: Vascular Surgery

## 2011-02-08 ENCOUNTER — Ambulatory Visit (INDEPENDENT_AMBULATORY_CARE_PROVIDER_SITE_OTHER): Payer: Medicare Other | Admitting: Vascular Surgery

## 2011-02-08 ENCOUNTER — Other Ambulatory Visit (INDEPENDENT_AMBULATORY_CARE_PROVIDER_SITE_OTHER): Payer: Medicare Other

## 2011-02-08 ENCOUNTER — Encounter (INDEPENDENT_AMBULATORY_CARE_PROVIDER_SITE_OTHER): Payer: Medicare Other

## 2011-02-08 DIAGNOSIS — I6529 Occlusion and stenosis of unspecified carotid artery: Secondary | ICD-10-CM

## 2011-02-08 DIAGNOSIS — M79609 Pain in unspecified limb: Secondary | ICD-10-CM

## 2011-02-15 ENCOUNTER — Other Ambulatory Visit: Payer: Self-pay

## 2011-02-23 NOTE — Procedures (Unsigned)
CAROTID DUPLEX EXAM  INDICATION:  Carotid disease.  HISTORY: Diabetes:  No. Cardiac:  No. Hypertension:  Yes. Smoking:  Previous. Previous Surgery:  Abscess removal in neck. CV History:  Currently asymptomatic. Amaurosis Fugax No, Paresthesias No, Hemiparesis No.                                      RIGHT             LEFT Brachial systolic pressure:         135               144 Brachial Doppler waveforms:         Normal            Normal Vertebral direction of flow:        Antegrade         Antegrade DUPLEX VELOCITIES (cm/sec) CCA peak systolic                   Occluded          167 ECA peak systolic                   45                168 ICA peak systolic                   Occluded          213 ICA end diastolic                                     25 PLAQUE MORPHOLOGY:                  Heterogenous      Heterogenous PLAQUE AMOUNT:                      Occlusive         Mild PLAQUE LOCATION:                    ICA/ECA/CCA       ICA  IMPRESSION: 1. 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 2. Doppler velocities suggest a 40% to 59% stenosis of the left     internal carotid artery. 3. Velocities of the left carotid artery system appear to be increased     due to compensatory flow.  ___________________________________________ Janetta Hora Fields, MD  CH/MEDQ  D:  02/08/2011  T:  02/08/2011  Job:  [id number]

## 2011-02-24 LAB — URINALYSIS, ROUTINE W REFLEX MICROSCOPIC
Bilirubin Urine: NEGATIVE
Hgb urine dipstick: NEGATIVE
Nitrite: NEGATIVE
Protein, ur: NEGATIVE mg/dL
Urobilinogen, UA: 1 mg/dL (ref 0.0–1.0)

## 2011-02-24 LAB — COMPREHENSIVE METABOLIC PANEL
AST: 37 U/L (ref 0–37)
Albumin: 3.9 g/dL (ref 3.5–5.2)
Alkaline Phosphatase: 82 U/L (ref 39–117)
BUN: 8 mg/dL (ref 6–23)
CO2: 26 mEq/L (ref 19–32)
GFR calc Af Amer: >60 mL/min (ref >60)
GFR calc non Af Amer: >60 mL/min (ref >60)
Potassium: 4 mEq/L (ref 3.5–5.1)
Sodium: 136 mEq/L (ref 135–145)
Total Bilirubin: 0.7 mg/dL (ref 0.3–1.2)
Total Protein: 7.7 g/dL (ref 6.0–8.3)

## 2011-02-24 LAB — GRAM STAIN

## 2011-02-24 LAB — CULTURE, BLOOD (ROUTINE X 2): Culture: NO GROWTH

## 2011-02-24 LAB — CBC
HCT: 41 % (ref 39.0–52.0)
Hemoglobin: 13.9 g/dL (ref 13.0–17.0)
Hemoglobin: 15.7 g/dL (ref 13.0–17.0)
MCH: 34.5 pg — ABNORMAL HIGH (ref 26.0–34.0)
MCH: 34.7 pg — ABNORMAL HIGH (ref 26.0–34.0)
MCHC: 33.9 g/dL (ref 30.0–36.0)
MCHC: 34.1 g/dL (ref 30.0–36.0)
MCV: 101.6 fL — ABNORMAL HIGH (ref 78.0–100.0)
RBC: 4.52 MIL/uL (ref 4.22–5.81)
RDW: 12.5 % (ref 11.5–15.5)

## 2011-02-24 LAB — DIFFERENTIAL
Basophils Absolute: 0 10*3/uL (ref 0.0–0.1)
Lymphs Abs: 2.2 10*3/uL (ref 0.7–4.0)
Neutro Abs: 8.2 10*3/uL — ABNORMAL HIGH (ref 1.7–7.7)
Neutrophils Relative %: 67 % (ref 43–77)

## 2011-02-24 LAB — CULTURE, ROUTINE-ABSCESS

## 2011-02-24 LAB — BASIC METABOLIC PANEL
CO2: 32 mEq/L (ref 19–32)
Chloride: 100 mEq/L (ref 96–112)
GFR calc non Af Amer: >60 mL/min (ref >60)
Glucose, Bld: 126 mg/dL — ABNORMAL HIGH (ref 70–99)
Potassium: 4.1 mEq/L (ref 3.5–5.1)
Sodium: 139 mEq/L (ref 135–145)

## 2011-02-24 LAB — PROCALCITONIN: Procalcitonin: <0.5 ng/mL

## 2011-02-24 LAB — LIPID PANEL
Cholesterol: 157 mg/dL (ref 0–200)
HDL: 37 mg/dL — ABNORMAL LOW (ref >39)
LDL Cholesterol: 105 mg/dL — ABNORMAL HIGH (ref 0–99)
Total CHOL/HDL Ratio: 4.2 RATIO
VLDL: 15 mg/dL (ref 0–40)

## 2011-02-24 LAB — PROTIME-INR
INR: 1.07 (ref 0.00–1.49)
Prothrombin Time: 13.8 seconds (ref 11.6–15.2)

## 2011-02-24 LAB — CARDIAC PANEL(CRET KIN+CKTOT+MB+TROPI)
CK, MB: 3.6 ng/mL (ref 0.3–4.0)
Total CK: 272 U/L — ABNORMAL HIGH (ref 7–232)
Troponin I: 0.03 ng/mL (ref 0.00–0.06)

## 2011-02-24 LAB — RAPID URINE DRUG SCREEN, HOSP PERFORMED
Amphetamines: NOT DETECTED
Tetrahydrocannabinol: NOT DETECTED

## 2011-02-24 LAB — CK TOTAL AND CKMB (NOT AT ARMC): Relative Index: 1.6 (ref 0.0–2.5)

## 2011-02-24 LAB — HEMOGLOBIN A1C: Hgb A1c MFr Bld: 6.1 % — ABNORMAL HIGH (ref <5.7)

## 2011-03-21 LAB — POCT I-STAT, CHEM 8
Creatinine, Ser: 1.1 mg/dL (ref 0.4–1.5)
Glucose, Bld: 89 mg/dL (ref 70–99)
Hemoglobin: 16.3 g/dL (ref 13.0–17.0)
Potassium: 3.8 mEq/L (ref 3.5–5.1)

## 2011-04-24 NOTE — Assessment & Plan Note (Signed)
OFFICE VISIT   Nicholas [name], Nicholas [name]  DOB:  [birth year]                                       07/27/2010  JOACZ#:[id number]   CHIEF COMPLAINT:  Right neck infection.   HISTORY OF PRESENT ILLNESS:  The patient is a 60 year old male who  recently had I and D of a neck abscess by Dr. Corliss Skains in July of this  year.  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  The patient  had a right carotid endarterectomy by Dr. Marcy Panning in the 1990s.  Subsequently he developed an abscess in a keloid that had formed from  this incision in 2007.  The I and D was performed by Dr. Orson Slick at that  time.  He then had the most recent I and D by Dr. Corliss Skains in July 2011.  Culture grew out multiple bacteria morphotypes with no specific  bacterial species.  He currently denies any fever or chills.  He denies  any weight loss.  Denies any night sweats.  The wound has completely  healed at this point and he has had no further drainage.  The patient  does not remember whether or not a prosthetic patch was used at the time  of operation.  He is also requesting today the referral for possible  excision of the keloid as he has difficulty shaving this area.   He also has complaints of left lower extremity pain.  The patient also  had a left femoral to above-knee popliteal bypass by Dr. Marcy Panning  several years ago.  He states that he had some problems walking with his  left leg with early fatigability.  He states that this never really  improved after his bypass.  He currently can walk about 50-100 yards  before he develops fatigue in the left lower extremity.  This is more of  a nuisance to him and he is not really interested in necessarily  pursuing anything further for the left leg at this point.   CHRONIC MEDICAL PROBLEMS:  Include coronary artery disease,  hypertension, elevated cholesterol.  He has recently reestablished his  primary care physician as Dr. Raquel James.  He had seen  Dr. Juanito Doom in the  past for his coronary disease.   PAST MEDICAL HISTORY:  Also remarkable for a coronary stent in the past.   PAST SURGICAL HISTORY:  He has had multiple dental extractions, the  abscess in his neck and a bypass in his leg as described above.   SOCIAL HISTORY:  He is unemployed on disability.  He is a former smoker,  but quit 9 years ago.  Does not consume alcohol regularly.  He has two  children.  He is separated from his wife.   FAMILY HISTORY:  Unremarkable.   Full 12 point review of systems was performed with the patient today.  Please see intake referral form for details regarding this.   MEDICATIONS:  1. Pravastatin 20 mg once daily.  2. Aspirin 325 mg once daily.   ALLERGIES:  HE HAS NO KNOWN DRUG ALLERGIES.   PHYSICAL EXAM:  VITAL SIGNS:  Blood pressure is 144/85 in the right arm  and 137/83 in the left arm, heart rate is 81 and regular.  Temperature  is 97.9.  HEENT:  Unremarkable.  Right neck shows an 8 x 3 cm keloid  in the  typical incision for right carotid endarterectomy.  This incision is  completely healed at this point.  There is no surrounding erythema.  There is no drainage.  He has bilateral carotid bruits left greater than  right.  CHEST:  Clear to auscultation.  CARDIAC:  Regular rate and rhythm with a diastolic 3/6 murmur.  ABDOMEN:  Soft, nontender, nondistended.  No masses.  EXTREMITIES:  He has 2+ radial, 2+ femoral and a 2+ right dorsalis pedis  pulse.  He has absent popliteal and pedal pulses in the left leg.  Left  foot, however is pink, warm and well-perfused.  There is slightly  increased edema of left leg compared to the right which the patient  states is chronic.  MUSCULOSKELETAL:  Exam shows no obvious major joint deformities.  NEUROLOGIC:  Exam shows symmetric upper extremity and lower extremity  motor strength which is 5/5.  Skin has no open ulcers or rashes.  Of  note, he has incisions in the left lower extremity  consistent with a  left fem-pop bypass and there is no keloid in this.   I reviewed his CT scan of the neck that was performed at the time of his  abscess in July 2011.  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  The infection did not appear to extend all the way  down to the level of the carotid artery.  The left carotid artery had a  50% stenosis.   SUMMARY:  1. Right neck abscess now resolved.  We will obtain the previous      operative note from Dr. Cammie Sickle office at [facility] to      make sure that a prosthetic patch was not used.  If this was the      case it could be that this has intermittently seeded the wound and      could be a chronic smoldering infection.  If this is a vein patch      or a primary closure then most likely this infection is not related      to his carotid surgery.  We will also refer him to Dr. Blanch Media      for evaluation of possible revision of his keloid.  I discussed      with the patient today that Dr. Noelle Penner would be able to make      recommendations on whether or not this would be beneficial to him      and possible recurrence rate.  2. We have referred him back to Dr. Juanito Doom as the patient does      occasionally get some chest pain and he has not seen his      cardiologist in several years.  We will arrange an outpatient visit      for him regarding that.  I discussed with him today continuing to      take his aspirin daily.  He states that usually his chest pain      resolves if he takes an extra aspirin or two.  Since the patient      does have a history of a cardiac murmur we will leave it at Dr.      Vern Claude discretion whether or not this needs further evaluation.  3. Left lower extremity weakness.  Most likely this has some aspect of      claudication associated with it.  I am not sure whether or not his  bypass stopped working early and this was the reason that he states      that his neck  symptoms never improved or whether or not there may      be some other factor present here.  This is currently only a      nuisance to him and he is not interested in pursuing it      aggressively at this point, however we will have him return in six      months' time and do ABIs bilaterally for overall baseline exam and      overall prognosis long-term.  When he returns we will also do      bilateral carotid duplex exam to make sure that he has not had      progression of narrowing on the left carotid arteries since the      right side apparently is occluded at this point.  All of this plan      was discussed with the patient today.  He will follow up with me in      six months' time.     Janetta Hora. Fields, MD  Electronically Signed   CEF/MEDQ  D:  07/27/2010  T:  07/28/2010  Job:  [id number]   cc:   Wilmon Arms. Tsuei, M.D.  Thomas C. Daleen Squibb, MD, [facility]  Loreta Ave, MD

## 2011-04-27 NOTE — Op Note (Signed)
NAMEBERNELL, SIGAL NO.:  [id number]   MEDICAL RECORD NO.:  [id number]          PATIENT TYPE:  INP   LOCATION:  4731                         FACILITY:  [facility]   PHYSICIAN:  Lebron Conners, M.D.   DATE OF BIRTH:  May 22, 1951   DATE OF PROCEDURE:  08/20/2006  DATE OF DISCHARGE:                                 OPERATIVE REPORT   PREOPERATIVE DIAGNOSIS:  Abscess on the right side of the neck.   POSTOPERATIVE DIAGNOSIS:  Abscess on the right side of the neck.   OPERATION:  Incision and drainage of abscess.   SURGEON:  Lebron Conners, M.D.   ANESTHESIA:  Local.   COMPLICATIONS:  None.   SPECIMEN:  Culture.   BLOOD LOSS:  Minimal   PROCEDURE:  After the neck was prepped and draped, I thoroughly infiltrated  local anesthetic into the large keloid scar which was present.  As I  injected local anesthetic in a site where fluctuance was appreciated, I  could see some fluid come out at the base of the keloid, and so I knew  basically which direction the abscess traveled.  After adequate local  anesthesia, I made two incisions, one about a centimeter long, one about 0.5  cm long in fluctuant areas, and pus came out.  I took cultures.  I put a  hemostat in and spread the tissues and then went into a sizable abscess  cavity more or less under the keloid and slightly cephalad to it.  After  adequately opening it, I packed it with gauze.  Hemostasis was excellent.  The patient tolerated it well.      Lebron Conners, M.D.  Electronically Signed     WB/MEDQ  D:  08/20/2006  T:  08/21/2006  Job:  [id number]

## 2011-04-27 NOTE — H&P (Signed)
[name], [name] NO.:  [id number]   MEDICAL RECORD NO.:  [id number]          PATIENT TYPE:  INP   LOCATION:  1826                         FACILITY:  [facility]   PHYSICIAN:  Madolyn Frieze. Jens Som, MD, FACCDATE OF BIRTH:  [birth year]   DATE OF ADMISSION:  08/20/2006  DATE OF DISCHARGE:                                HISTORY & PHYSICAL   PRIMARY CARDIOLOGIST:  Maisie Fus C. Wall, MD, [facility].   PRIMARY CARE PHYSICIAN:  None.   HISTORY OF PRESENT ILLNESS:  [name] is a 60 year old African American  man with history of coronary artery disease with an acute myocardial  infarction in 1993, status post stent placement to the LAD and the last  catheterization in 2004 showing left main 20%, LAD 20% (previous PCI site  widely patent), 40% proximal left circumflex, 40% obtuse marginal, 30% RCA  and ejection fraction of 55-60%, status post carotid endarterectomy (right),  peripheral vascular disease, hypertension, SVT, questionable history of CVA  and remote tobacco abuse, noncompliant with medications for the last four  years.  Presenting to the ED, transferred from urgent care center for  evaluation of right keloid abscess.  Patient was taken for neck x-rays where  he developed chest pain.  Patient has had intermittent chest pain for years  before but worse in the last four to five months.  Patient says pain is  intermittent, pressure-like, left side of the chest, sometimes radiating to  the back, lasts about 3-4 hours, occurs with exertion or stress, also with  rest.  Increased on exertion and lying flat, relieved with inspiration.  No  association with food habits, no symptoms of reflux, nausea or vomiting.  Vague history of hematochezia and melena.  Currently patient is chest pain-  free.   ALLERGIES:  NO KNOWN DRUG ALLERGIES.   PAST MEDICAL HISTORY:  1. Hypertension.  2. Coronary artery disease with MI and stent placement to LAD in 1993.  3. History of SVT.   PAST  SURGICAL HISTORY:  1. Right carotid endarterectomy 15 years ago.  2. Right hydrocelectomy June 2004.  3. Left femoral-popliteal bypass surgery February 2004.   MEDICATION:  Daily aspirin only.   SUBSTANCE HISTORY:  Patient was a former smoker, quit six years ago.  No  history of alcohol or drug abuse.   FAMILY HISTORY:  No history of coronary artery disease in family.   SOCIAL HISTORY:  [place]  5-6 years secondary to back trauma.   PHYSICAL EXAMINATION:  GENERAL APPEARANCE:  Patient did not appear in any  acute distress.  VITAL SIGNS:  Temperature 99.8, blood pressure 145/82, pulse 84, respiratory  rate 16, O2 saturation 95% on room air.  HEENT:  Eyes:  Pupils are equal, round and reactive to light.  Extraocular  movements intact.  Oropharynx clear.  No erythema or exudates.  NECK:  Right carotid endarterectomy scar seen.  Infected keloid, mildly  fluctuant and minimal drainage.  Erythema and edema present.  LUNGS:  Air entry equal bilaterally.  Clear to auscultation, no wheeze or  rhonchi.  CARDIOVASCULAR:  Regular  rate and rhythm, a 2/6 systolic ejection murmur  present.  ABDOMEN:  Soft, nondistended, nontender, bowel sounds present.  EXTREMITIES:  Warm.  Edema negative.   LABORATORY DATA:  Sodium 138, potassium 4.2, chloride 106, BUN 8, creatinine  1.2,, glucose 102.  Hemoglobin 16.7, hematocrit 49.  Point of care markers  x1 negative.   Neck x-rays showing prominent epiglottis, no retropharyngeal abscess.   EKG shows normal sinus rhythm, septal infarct, mild anterior ST elevation  (unchanged from previous EKG).   ASSESSMENT/PLAN:  1. Atypical chest pain with a history of ischemic coronary artery disease      status post stent placement.  Admission to rule out myocardial      infarction.  Patient will be admitted to telemetry and cardiac enzymes      cycles.  Repeat EKG in the morning will be checked.  Patient will be       started on aspirin, Lopressor b.i.d. and Statin.  If enzymes are      negative, to follow-up with Myoview as an outpatient.  Patient was      counseled to take medications regularly.Non cardiac causes need to be      kept in mind.  2. ?Right sided neck keloid abscess.  Minimal drainage and mildly      fluctuant on exam.  CBC pending.  Will start patient on Keflex and      consult [facility] for possible drainage.  3. History of hematochezia with melena.  Patient is not anemic at this      time.  Will check Hemoccult stool and monitor hemoglobin and hematocrit      throughout hospitalization.  4. Hypertension.  Patient will be started on Lopressor 25 mg p.o. b.i.d.      Patient needs to follow up with his primary care physician for his      other medical issues.  5. Prophylaxis.  Protonix for gastrointestinal prophylaxis and PAS hoses      for deep venous thrombosis prophylaxis.      [name], M.D.  Electronically Signed     ______________________________  Madolyn Frieze. Jens Som, MD, [facility]    SS/MEDQ  D:  08/20/2006  T:  08/20/2006  Job:  [id number]

## 2011-04-27 NOTE — Consult Note (Signed)
NAME:  Nicholas [name], Nicholas [name] NO.:  [id number]   MEDICAL RECORD NO.:  [id number]                   PATIENT TYPE:  INP   LOCATION:  3743                                 FACILITY:  [facility]   PHYSICIAN:  Duke Salvia, M.D. [facility]           DATE OF BIRTH:  [birth year]   DATE OF CONSULTATION:  01/18/2003  DATE OF DISCHARGE:                                   CONSULTATION   REASON FOR CONSULTATION:  Thank you very much for asking me to see this  patient for an electrophysiological consultation for recurrent tachy  palpitations.  The patient is a 60 year old gentleman who is disabled  because of a back injury, who has a history of vascular disease, including a  myocardial infarction treated with a stent in the early 1990s, a stroke,  question mechanism, and lower extremity pain and swelling with a recent  demonstration of severe peripheral vascular disease with impending  revascularization schedule at the end of this week.   A preoperative electrocardiogram demonstrated ST segment elevation and a  history of chest pain, who is admitted for a cardiac catheterization today,  which demonstrated non-obstructive disease and anterior apical hypokinesis.  While in the laboratory, and naturally during the hospitalization, the  patient had recurrent episodes of a new QRS tachycardia with a cycle length  of about 150 beats per minute, which upon review is a long RP tachycardia.   He has had episodes of recurrent tachy palpitations three times a week or  so, which has been ongoing for the last two or three years.  In fact, he was  seen in the emergency room here in March 2001 for similar symptoms and  complained of tachy palpitations, but left A.M.A.   MEDICATIONS:  He takes no medications for this.  He takes no medications at  baseline except for occasional hydrochlorothiazide and aspirin.  Apparently  also some amitriptyline and Doxepin p.r.n.   SOCIAL HISTORY:  I failed to  review his caffeine history.   PAST MEDICAL HISTORY:  1. Notable for hypertension.  2. Hyperlipidemia.  3. Remote cigarette use.  4. He has in-hospital hyperglycemia.  5. Problems associated with heat intolerance.   REVIEW OF SYSTEMS:  Is notable for back pain.   PHYSICAL EXAMINATION:  GENERAL:  He is a middle-aged African-American male,  in no acute distress.  VITAL SIGNS:  Blood pressure elevated at 135-170/60-70.  Heart rate is 88,  respirations 16 and unlabored.  He is lying flat in bed following his  catheterization.  HEENT:  Demonstrates no scleral icterus or xanthomata.  NECK:  His neck veins were saphenous.  He has a left bruit and a prior right  CEA with a keloid.  LUNGS:  Sounds were clear anteriorly.  HEART:  His sounds were regular with an S4.  No murmurs were appreciated.  ABDOMEN:  Soft with active bowel sounds.  EXTREMITIES:  There is no peripheral edema.  NEUROLOGIC:  Grossly normal, although the affect was flat, and he was  somewhat disengaged.   LABORATORY AND ACCESSORY DATA:  Electrocardiogram:  In sinus rhythm dated  January 15, 2003, demonstrated a rate of 98, with intervals of 0.15/28/0.32,  with ST segment elevated in leads V1, V2, V3, V4, V5 and V6, and also in  lead II, lead III and aVF, with some degree or PR segment depression  consistent with pericarditis.  Electrocardiogram dated January 16, 2003, at  2244 hours:  Demonstrates a long RP tachycardia at a rate of 400 msec.   IMPRESSION:  1. Long RP tachycardia, either atrioventricular node re-entry or PR/VT most     likely as they were associated with premature ventricular contractions,     that are quite frequent and increasingly recurrent, associated with chest     pain.  2. Coronary artery disease with     a. Prior left anterior descending coronary artery stenting.     b. Nonobstructive coronary artery disease at cardiac catheterization        today, with near normal left ventricular function of  55% with mild        anterior wall motion abnormality.  3. Abnormal electrocardiogram, suggestive of pericarditis.  4. Cerebrovascular accident with prior right carotid endarterectomy.  5. Chronic leg pain with long vascular occlusion, for surgery later this     week.  6. Flat affect, secondary to question of cerebrovascular accident, versus     depression, versus block.   DISCUSSION:  The patient has recurrent problems with supraventricular  tachycardia that has been associated with chest pain that has prompted  hospitalizations.  Immuno-blocking agents are a reasonable thing, but I  would discontinue the Lopressor, as it may aggravate his vascular disease,  leaving his vasodilatory tone unopposed.  Would try a calcium blocker in its  stead.   I would also strongly consider an RF catheter ablation, as relative for  possible recurrent hospitalizations for this.  The patient was not sure how  he would like to follow up with me about this.  We will plan to see him  again at his request.  I have given him my name and number.                                               Duke Salvia, M.D. [facility]    SCK/MEDQ  D:  01/18/2003  T:  01/18/2003  Job:  [id number]   cc:   Mills Koller, M.D.   [facility]   Fleet Contras, M.D.  [address]  [place]  [place] [address]  Fax: [phone]

## 2011-04-27 NOTE — Assessment & Plan Note (Signed)
[facility]                            CARDIOLOGY OFFICE NOTE   NAME:Nicholas [name]                    MRN:          [id number]  DATE:11/20/2006                            DOB:          1951-06-08    Nicholas [name] returns today for further management of coronary artery  disease. Please see the comprehensive note from September 03, 2006.   He is having no angina or ischemic symptoms. He does have a sharp,  stabbing pain up in his left upper chest. It is well-localized and is  there most of the time.   He is still walking 4 or 5 blocks a day. He denies any shortness of  breath.   He is due lipids.   His current medications are Ecotrin 325 a day, Zocor 80 mg a day, Toprol  XL 50 mg a day.   His blood pressure today is 138/86; his pulse is 80 and irregular; his  weight is 260. He is in no acute distress.  HEENT: Normocephalic, atraumatic. PERRLA. Extra-ocular movements intact.  Sclerae are clear.  NECK: He has the keloid from his right carotid endarterectomy. Thyroid  is not enlarged. Trachea is midline.  LUNGS:  Are clear.  HEART: Reveals a soft S1, S2. PMI cannot be appreciated.  ABDOMEN: Is protuberant with good bowel sounds. There is no swelling.  EXTREMITIES: Reveals no cyanosis, clubbing or edema. Pulses are intact.  NEURO: Examination is intact.  MUSCULOSKELETAL: Is intact.  SKIN: Is intact.   ASSESSMENT/PLAN:  Nicholas [name] is doing well. I have renewed his  medications for a year. I will see him back in followup in a year. He  will call me if there are any problems in the meantime.     Thomas C. Daleen Squibb, MD, [facility]  Electronically Signed    TCW/MedQ  DD: 11/20/2006  DT: 11/20/2006  Job #: [id number]

## 2011-04-27 NOTE — H&P (Signed)
NAME:  Nicholas [name], Nicholas [name]                       ACCOUNT NO.:  [id number]   MEDICAL RECORD NO.:  [id number]                   PATIENT TYPE:  EMS   LOCATION:  ED                                   FACILITY:  [facility]   PHYSICIAN:  Doylene Canning. Ladona Ridgel, M.D. [facility]           DATE OF BIRTH:  05-10-1951   DATE OF ADMISSION:  12/07/2002  DATE OF DISCHARGE:                                HISTORY & PHYSICAL   CHIEF COMPLAINT:  Chest pain, headache, and itchiness.   HISTORY OF PRESENT ILLNESS:  The patient is a very pleasant 60 year old male  with a history of coronary artery disease, status post acute coronary  syndrome and with an MI in 1994, with percutaneous coronary intervention,  the details of which are not present.  The patient subsequently underwent  carotid endarterectomy.  He did well for many years and was in his usual  state of health until approximately three to four weeks ago when he  developed intermittent chest pain.  This was not related to exertion.  It  was substernal in nature.  There was no shortness of breath.  It has been  relieved with aspirin in the past.  The patient presented to the emergency  room tonight after having some chest discomfort this morning and this  afternoon.  His main complaint, though, was continued pruritus and headache.  In the emergency room his EKG demonstrated normal sinus rhythm with anterior  ST elevation in leads V2 through V6.  Compared to a prior ECG from May 2001,  his ST segment elevation was present at that time and very similar to the  present EKG although perhaps more marked today.  There were small Q-waves in  leads V2 and V3.  The patient presently denies chest pain or pruritus.  He  denies shortness of breath, neck or jaw pain.   PAST MEDICAL HISTORY:  As previously noted.  In addition, he has a history  of hypertension.   SOCIAL HISTORY:  The patient is disabled and unemployed.  He quit smoking  cigarettes several years ago.  He denies  ethanol abuse.   FAMILY HISTORY:  Notable for a mother who is still living.  Father died of  stroke.   REVIEW OF SYSTEMS:  Notable for no change in his bowel or bladder habits.  He has had no arthritis symptoms.  He denies any recent weight changes,  polyuria, or polydipsia.  He denies any neurologic changes.  He does have  generalized pruritus.  He also has intermittent headaches.  He denies any  weakness or numbness.  The rest of his review of systems is negative.   PHYSICAL EXAMINATION:  GENERAL:  Pleasant, well-appearing, middle-aged man  in no distress.  VITAL SIGNS:  Blood pressure 130/80, pulse 97 and regular, respirations 18.  HEENT:  Normocephalic, atraumatic.  Pupils equal and reactive.  Oropharynx  is moist.  Sclerae were anicteric.  NECK:  Revealed  no jugular venous distention.  The carotids are 2+ and  symmetric.  There is a well-healed right carotid endarterectomy scar with a  keloid present.  Trachea was midline.  LUNGS:  Clear bilaterally to auscultation.  There were no wheezes, rales, or  rhonchi.  CARDIOVASCULAR:  Regular rate and rhythm with an S4.  There were no murmurs  except for a soft systolic murmur at the right upper sternal border.  ABDOMEN:  Soft, nontender, nondistended.  There was no organomegaly.  EXTREMITIES:  No cyanosis, clubbing, or edema.  NEUROLOGIC:  Alert and oriented x 3.  Cranial nerves II-XII grossly intact.  Strength was 5/5 and symmetric.   LABORATORY DATA:  EKG demonstrates normal sinus rhythm with 1 mm of anterior  ST elevation in leads V2 through V6.  Compared to prior EKG back in May  2001, the ST elevation was present at that time, all consistent with J point  elevation.   Initial laboratories demonstrate normal cardiac enzymes and troponin.  Normal hemoglobin.  Normal creatinine and liver panel.   IMPRESSION:  1. Recurrent chest pain with mostly atypical features for angina pectoris     with additional negative cardiac enzymes.   2. History of coronary artery disease, status post percutaneous coronary     intervention.  3. Cerebrovascular disease, status post carotid endarterectomy.  4. Abnormal electrocardiogram but not significantly changed from baseline.  5. Headache with pruritus which is now resolved.   DISCUSSION:  Though the patient's symptoms are atypical, I think admission  is warranted based on his previous history and abnormal EKG.  The patient,  however, insists on going home.  I discussed my concern with the patient  about leaving; he could have a life-threatening cardiac event including but  not limited to heart attack (myocardial infarction) or cardiac arrest and  subsequent death.  Despite these issues and these warnings, the patient  insists on going home.  We will plan to schedule outpatient follow-up  secondary to his refusal of admission.                                               Doylene Canning. Ladona Ridgel, M.D. [facility]    GWT/MEDQ  D:  12/07/2002  T:  12/07/2002  Job:  [id number]   cc:   Thomas C. Wall, M.D. [facility]  [address]  [place]  [place] [address]  Fax: [phone]

## 2011-04-27 NOTE — Discharge Summary (Signed)
NAME:  Nicholas [name], Nicholas [name]                       ACCOUNT NO.:  [id number]   MEDICAL RECORD NO.:  [id number]                   PATIENT TYPE:  INP   LOCATION:  3704                                 FACILITY:  [facility]   PHYSICIAN:  Jesse Sans. Wall, M.D. [facility]            DATE OF BIRTH:  05-Jun-1951   DATE OF ADMISSION:  01/15/2003  DATE OF DISCHARGE:  01/26/2003                           DISCHARGE SUMMARY - REFERRING   BRIEF HISTORY:  The patient is a 60 year old male with a history of an  abnormal EKG and coronary artery disease.  He came to the hospital for a  preop EKG and blood work for peripheral vascular surgery that was scheduled  in approximately one week.  The EKG was read out as an acute MI.  The  patient has a long history of chest pain.  He was admitted for further  evaluation.   PAST MEDICAL HISTORY:  The patient had an MI in December 1993.  At that time  he had a stent to the LAD.  The full details are not available at the time  of admission.  His ejection fraction was noted to be 45% in 1993.  He also  has a history of hypertension and a remote tobacco history.  He has a  history of having had a CVA, he is status post right carotid endarterectomy,  and he has a history of paroxysmal atrial fibrillation.   ALLERGIES:  No known drug allergies.   SOCIAL HISTORY:  The patient lives in [place].  He is disabled secondary  to back surgery.  He has a remote tobacco history.   FAMILY HISTORY:  The patient's mother is alive at age 57.  She has no  history of coronary artery disease.  She has had a CVA.  The patient's  father died at age 38 from a CVA.  There is no history of coronary artery  disease in the patient's siblings.   MEDICATIONS:  Prior to admission included:  1. Aspirin.  2. Hydrochlorothiazide.   Apparently the patient also took amitriptyline and Doxepin.   HOSPITAL COURSE:  As noted, this patient was admitted to [facility]  after a preoperative EKG was  noted to be abnormal.  Cardiac enzymes were  performed which were found to be negative.  The patient was scheduled for a  cardiac catheterization to further evaluate his symptoms.  He did have some  wide complex tachycardia while awaiting catheterization.   The cardiac catheterization was performed by Dr. Eden Emms on 01/18/03.  This  showed no recurrent coronary artery disease.  The ejection fraction was  estimated to be 55-60%.  The patient was noted to have atrial flutter while  in the cath lab which was treated with IV labetalol.  An AT evaluation was  recommended.   The patient was seen in consultation by Dr. Graciela Husbands on 01/18/03.  He was noted  to have problems with recurrent supraventricular  tachycardia associated with  chest pain.  He felt that RF ablation might be a consideration and Dr. Graciela Husbands  continued to follow the patient throughout his stay.   On 01/19/03 the patient underwent left FEM-POP bypass surgery performed by  Dr. Tanda Rockers.  He tolerated this well, however, postoperatively he did have  some elevated heart rates and it was treated with beta blockers and  Cardizem.   On 01/21/03 a urology consult was obtained from Dr. Brunilda Payor secondary to the  fact that the patient was unable to void after removal of his Foley  catheter.  Flomax was recommended.   The patient's medications were carefully adjusted throughout the remainder  of his stay.  He did have some wide complex tachycardia consistent with  nonsustaining V-TACH and his beta blocker was titrated upwards.   On 01/24/03 the patient had some transient left upper extremity numbness and  it was questioned whether this might be a possible TIA versus positional  symptoms.   On 01/25/03 an endocrine consult was obtained from Dr. Evlyn Kanner secondary to  hyperthyroidism.  Tapazole was recommended and a thyroid ultrasound was  ordered.  The thyroid ultrasound report is currently pending.   Arrangements were eventually made to discharge the  patient on 01/26/03 in  improved and stable condition.   LABORATORY DATA:  A CBC on the day of discharge revealed hemoglobin 13.4,  hematocrit 39, WBC 10.5000, platelets 488,000.  A chemistry profile on  01/26/03 revealed BUN 16, creatinine 1.2, potassium 3.6, glucose 124,  chloride 95.  A free T4 was elevated at 3.47 and RPR was nonreactive.  B12  was 315 which was within normal limits.  The TSH was low at 0.004.  A PSA  was 1.43.  Hemoglobin A1c was 5.4.  Stool for occult blood was negative.  A  lipid profile revealed cholesterol 157, triglycerides 60, LDL 102, HDL 43.   A CT scan of the head was performed on 01/24/03.  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  A chest x-ray on  01/15/03 was listed as stable.  An EKG on 01/15/03 showed a sinus tachycardia  rate 111 beats per minutes with Q waves consistent with a septal infarct and  lateral injury pattern.  Please see dictated cath report as noted.   DISCHARGE MEDICATIONS:  1. Enteric-coated aspirin 325 mg daily.  2. Hydrochlorothiazide 25 mg daily.  3. Flomax 0.4 mg at bedtime.  4. Cardizem CD 240 mg daily.  5. Zocor 20 mg at bedtime.  6. Lopressor 59 mg t.i.d.  7. Celexa 20 mg daily.  8. Tapazole 10 mg 2 each day.  9. Doxepin previously taken.  10.      K-Dur 20 mEq daily.   DISCHARGE INSTRUCTIONS:  The patient was told not to take Elavil or  amitriptyline anymore.  Dr. Graciela Husbands recommended avoiding any QRS-prolonging  medications.   The patient was told to bring his discharge instruction sheet and all  medications to all of his office visits.   ACTIVITY:  As tolerated.   DIET:  Low-salt, low-fat diet.   FOLLOW UP:  The patient is to follow up with Dr. Tanda Rockers in two weeks, Dr.  Evlyn Kanner as instructed.  It was recommended he that he stay on the Tapazole 20  mg daily for approximately three weeks and then he would need to have further lab tests performed.  He would follow up with Dr. Brunilda Payor as  needed,  Dr. Daleen Squibb on  02/15/03 at 2:45 p.m., and Dr. Concepcion Elk in the next couple of  weeks.  He was also to have a basic metabolic panel at this next office  visit.   PROBLEM LIST AT TIME OF DISCHARGE:  1. Chest pain.  MI ruled out.  2. Paroxysmal supraventricular tachyarrhythmias.  3. 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  4. Cardiac catheterization this admission showing no recurrent coronary     artery disease with an ejection fraction of 55-60%.  5. Hypokalemia.  Corrected.  6. Hyperthyroidism.  7. History of hypertension.  8. History of hyperlipidemia.  9. Remote tobacco use.  10.      Previous MI and percutaneous intervention with stenting of the LAD     in December 1993.  11.      Status post right carotid endarterectomy.  12.      Status post left FEM-POP surgery performed 01/19/03.  13.      History of CVA.     Delton See, P.A. [facility]                  Thomas C. Daleen Squibb, M.D. [facility]    DR/MEDQ  D:  01/26/2003  T:  01/26/2003  Job:  [id number]   cc:   Jeannett Senior A. Evlyn Kanner, M.D.  [address]  [place]  [place] [address]  Fax: [phone]   Theresia Majors. Tanda Rockers, M.D.  [address]  [place]  [place] [address]  Fax: [phone]   Lindaann Slough, M.D.  [address]  [place]  [place] [address]  Fax: [phone]   Fleet Contras, M.D.  [address]  [place]  [place] [address]  Fax: [phone]

## 2011-04-27 NOTE — Op Note (Signed)
NAME:  Nicholas [name], Nicholas [name]                       ACCOUNT NO.:  [id number]   MEDICAL RECORD NO.:  [id number]                   PATIENT TYPE:  AMB   LOCATION:  NESC                                 FACILITY:  [facility]   PHYSICIAN:  Melvyn Novas, M.D.                DATE OF BIRTH:  25-Jun-1951   DATE OF PROCEDURE:  05/31/2003  DATE OF DISCHARGE:                                 OPERATIVE REPORT   PREOPERATIVE DIAGNOSIS:  Right hydrocele.   POSTOPERATIVE DIAGNOSIS:  Right hydrocele.   PROCEDURE:  Right hydrocelectomy.   ANESTHESIA:  General.   SURGEON:  Mark C. Vernie Ammons, M.D.   ASSISTANT:  Melvyn Novas, M.D.   ESTIMATED BLOOD LOSS:  Minimal.   DRAINS:  None.   COMPLICATIONS:  None.   SPECIMENS:  Hydrocele sac.   BRIEF HISTORY:  Mr. Trickel is a 60 year old African-American male who has  had an enlarging right hydrocele.  This has caused his discomfort just due  to its cumbersome size.  He presented today for hydrocelectomy.   DESCRIPTION OF PROCEDURE:  Following administration of antibiotics and  anesthesia, Mr. Tavano was prepped and draped in a supine position.  His  scrotum had been shaved.  A midline scrotal incision was made along the  scrotal raphe.  Bovie cautery was used to open along the length of the  incision.  The subcutaneous layers, including the dartos layers and the  layers of the tunica.  The layers were opened down to the parietal layer of  the tunica vaginalis, the hydrocele sac.  Blunt dissection was used to  dissect the hydrocele sac off of the overlying layers circumferentially.  This allowed it to be delivered completely through the surgical wound.  Hydrocele sac was then opened superiorly.  Straw colored fluid was  evacuated.  The hydrocele sac was opened then completely in a cephalocaudad  plane.  The underlying testicles were examined.  Testicle appeared irritated  as if this had been a reactive hydrocele.  There were also several small  pearly  cystic papules on the testicle and epididymis which were removed  using Bovie cautery.  The hydrocele sac was then inverted in a bottle-neck  fashion over the cord and epididymis which slide posteriorly.  Excess sac  tissue was excised.  This was then closed in a bottle-neck fashion using  running locking 3-0 chromic suture.  This was closed such that there was no  compression of the cord.  The testicle was then replaced up in the scrotum  beneath the dartos layer.  The dartos was then closed using running 3-0  chromic suture.  This was also locked for hemostatic properties.  Any site  of oozing had been cauterized using Bovie  cautery.  The skin was then closed in a running fashion again using 3-0  chromic.  The wound was then sterilely dressed with a fluff dressing and  scrotal support.  The procedure  was then terminated.  Dr. Ihor Gully was  present and participate in the procedure.                                               Melvyn Novas, M.D.    DK/MEDQ  D:  05/31/2003  T:  05/31/2003  Job:  [id number]

## 2011-04-27 NOTE — Op Note (Signed)
NAME:  Nicholas [name], Nicholas [name]                       ACCOUNT NO.:  [id number]   MEDICAL RECORD NO.:  [id number]                   PATIENT TYPE:  INP   LOCATION:  NA                                   FACILITY:  [facility]   PHYSICIAN:  Jake Shark A. Tanda Rockers, M.D.             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   OPERATION:  Left femoral popliteal bypass utilizing a reverse saphenous  vein.   SURGEON:  Dr. Tanda Rockers.   INDICATIONS FOR PROCEDURE:  Nicholas [name] is a 60 year old man with diffuse  vascular disease including coronary disease and occlusive peripheral  vascular disease.  He presented with symptoms of progressive claudication in  his right lower extremity.  Arteriogram demonstrated occlusion of the SFA  with at least two vessel run off.  We recommended that he undergo a vein  graft from the common femoral to the  distal popliteal artery.   TECHNIQUE:  The patient was taken to the operating room, placed on the table  in the supine position.  Anesthesia was achieved with a general endotracheal  technique.  The left lower extremity was prepped with Betadine soap and  painted with Betadine solution and sterile drapes were applied.  The  popliteal artery was dissected from the medial approach above the knee.  Skin incision was made and taken down through the skin layers with  hemorrhage controlled with cautery.  Popliteal artery was identified and  adequately mobilized to allow the placement of vessel loops.  Attention was  next turned to the groin.  A longitudinal incision was made over the  pulsation of the femoral artery and taken down through the skin layers.  Bleeding was controlled with cautery.  The common femoral, profunda femoris  and superficial femoral artery were identified, mobilized and  controlled  with vessel loops.  thereafter, the saphenous vein was harvested from the  upper thigh.  The side branches were divided.  The vein was irrigated with  heparinized saline and was deemed to be adequate for a conduit.  The patient  was systemically heparinized, proximal and distal control were obtained on  the common femoral artery, the profunda and the superficial femoral.  Longitudinal arteriotomy was performed. The vein was anastomosed end to side  using a continuous suture technique with 6-0 Prolene.  The vein graft had  brisk antegrade flow, it was led through a subcutaneous tunnel by connecting  the previous longitudinal incisions for harvest.  The vein was juxtaposed to  distal popliteal artery while pulsation persisted.  Proximal and distal  control were obtained on the distal popliteal artery.  A longitudinal  arteriotomy was performed and end-to-side anastomosis was performed using a  continuous suture technique of 6-0 Prolene.  Prior to securing the final  sutures, a 3 dilator easily went into the distal vessel.  There was an area  of old thrombus within the vessel which was endarterectomized without  difficulty.  The flow was re-established without incident.  Doppler signal  was present in both the posterior tibia and dorsalis pedis.  The incisions  were closed in layers.  The Heparin was not reversed.  The skin was  reapproximated with staples.  sterile compressive dressing applied. The  patient tolerated the procedure well and was transferred from the operating  room to the recovery room in satisfactory condition with stable vital signs.                                               Harold A. Tanda Rockers, M.D.    Cephus Slater  D:  01/19/2003  T:  01/19/2003  Job:  [id number]   cc:   Fleet Contras, M.D.  [address]  [place]  [place] [address]  Fax: [phone]   Duke Salvia, M.D. [facility]   Jesse Sans. Wall, M.D. [facility]  [address]  [place]  [place] [address]  Fax:  [phone]   Charlton Haws, M.D. [facility]

## 2011-04-27 NOTE — Consult Note (Signed)
Nicholas [name], Nicholas [name] NO.:  [id number]   MEDICAL RECORD NO.:  [id number]          PATIENT TYPE:  INP   LOCATION:  1826                         FACILITY:  [facility]   PHYSICIAN:  Nicholas [name], M.D.   DATE OF BIRTH:  Aug 18, 1951   DATE OF CONSULTATION:  08/20/2006  DATE OF DISCHARGE:                                   CONSULTATION   CONSULTING SURGEON:  Nicholas [name], M.D.   ADMITTING PHYSICIAN:  Nicholas [name]. Nicholas Som, MD with [name].   REASON FOR CONSULTATION:  Infected right neck keloid   HISTORY OF PRESENT ILLNESS:  [name] is a 60 year old male patient with  history of a large right neck keloid that developed 15 years ago after  carotid endarterectomy surgery.  He presented to the ER, today, initially  for evaluation of drainage he has noticed from the keloid.  He developed  chest pain while in x-ray; and he was admitted by cardiology to rule out MI  and associated with this chest pain, he does have a cardiac history and  prior MI.  Surgical evaluation has been requested because of the drainage  from the keloid.   REVIEW OF SYSTEMS:  As above.  He has been draining for about 2-3 weeks from  this site.   PAST MEDICAL HISTORY:  1. Hypertension.  2. MI with stent to the LAD in 1993.  3. History of SVT.   PAST SURGICAL HISTORY:  1. Right carotid endarterectomy 15 years ago.  2. Right hydrocelectomy, 2004.  3. Left femoral-popliteal February 2004.   SOCIAL HISTORY:  Former tobacco use.  No alcohol.  Disabled.   FAMILY HISTORY:  Noncontributory.   PHYSICAL EXAM:  GENERAL:  Pleasant male complaining of pain and drainage  from the right keloid site at the neck.  VITAL SIGNS:  Temperature 99.8, BP 145/82, pulse 84 and regular,  respirations 16.  NEUROLOGIC:  The patient is alert and oriented x3, moving all extremities  x4.  No focal deficits  HEENT AND NECK:  Neck is supple.  There is a large irregular shaped keloid  linear pattern in the  lower portion of the neck estimated to be at 10-11 cm  in length.  The distal aspect has thick, yellow, purulent drainage as well  as a small fluctuant area on the distal surface.  There is some mild  erythema.  CHEST:  Bilateral lung sounds are clear to auscultation.  Respiratory effort  is nonlabored.  CARDIAC:  There is a grade 2/6 systolic murmur best heard at the left  sternal border at the base of the heart.  Pulse is regular.  ABDOMEN:  Soft, nontender, nondistended without hepatosplenomegaly, masses  or bruits.  EXTREMITIES:  Symmetrical in appearance and trace edema.   LABS:  Sodium 138, potassium 4.2, CO2 31, glucose 102, BUN 8, creatinine  1.2.  CBC is pending.   IMPRESSION:  Early abscess right keloid.   PLAN:  1. Bedside I&D with cultures was done per Dr. Orson [name], please see his      dictated operative note.  2. Due to the pharmacokinetics of Keflex, Dr.  Bowman requests that we      change this to 500 mg q.i.d.  3. Once the infection has cleared, the patient is to followup with Dr.      Orson [name], in the office, for elective removal and repair of this keloid.      Nicholas [name], N.P.      Nicholas [name], M.D.  Electronically Signed    ALE/MEDQ  D:  08/20/2006  T:  08/21/2006  Job:  [id number]   cc:   Nicholas [name]. Nicholas Som, MD, [name]

## 2011-04-27 NOTE — Consult Note (Signed)
NAME:  Nicholas [name], Nicholas [name]                       ACCOUNT NO.:  [id number]   MEDICAL RECORD NO.:  [id number]                   PATIENT TYPE:  INP   LOCATION:  3311                                 FACILITY:  [facility]   PHYSICIAN:  Lindaann Slough, M.D.               DATE OF BIRTH:  07-30-1951   DATE OF CONSULTATION:  DATE OF DISCHARGE:                                   CONSULTATION   REASON FOR CONSULTATION:  Inability to urinate.  Consultation requested by  Dr. Tanda Rockers.   HISTORY:  The patient is a 60 year old male status post a left femoral  popliteal bypass.  He was unable to void after removal of the Foley  catheter.  The catheter was reinserted in the bladder.  It is now draining  clear urine.  The patient had nocturia times 2-3 before admission, however  he denies frequency, hesitancy, or straining on urination, and he has no  history of hematuria.   PHYSICAL EXAMINATION:  CHEST:  He has no CVA tenderness.  SKIN:  His skin is unremarkable.  GENITOURINARY:  His bladder is not distended.  Penis is normal.  Meatus is  normal.  He has a indwelling Foley catheter that is draining clear urine.  The scrotum is swollen.  The left testes is normal.  The swelling is more on  the right side and it is fluctuant, nonreducible, nontender.  Cords and  epididymis are within normal limits.  The patient reports that he had that  swelling for over two years now.  RECTAL:  Sphincter tone is normal.  He has no external hemorrhoids.  Prostate is enlarged 30 grams, no nodules.  Seminal vesicles not palpable.   LABORATORY DATA:  BUN 9, creatinine 1.0.   IMPRESSION:  1. Urinary retention.  2. Neurogenic bladder.  3. Benign prostatic hypertrophy.  4. Right hydrocele.   SUGGESTIONS:  Check prostate specific antigen.  Flomax 0.4 mg at night.  Remove the Foley catheter in the morning for voiding trial.   If the patient is unable to void we will reinsert Foley catheter and he can  go home with the  Foley catheter if he is ready for discharge.  We will then  follow him as an outpatient.                                               Lindaann Slough, M.D.    MN/MEDQ  D:  01/21/2003  T:  01/21/2003  Job:  [id number]   cc:   Jake Shark A. Tanda Rockers, M.D.  [address]  [place]  [place] [address]  Fax: [phone]   Fleet Contras, M.D.  [address]  [place]  [place] [address]  Fax: [phone]   Duke Salvia, M.D. [facility]   Jesse Sans. Daleen Squibb, M.D. [facility]   Theron Arista  Eden Emms, M.D. [facility]

## 2011-04-27 NOTE — Discharge Summary (Signed)
Nicholas [name], Nicholas [name]             ACCOUNT NO.:  [id number]   MEDICAL RECORD NO.:  [id number]          PATIENT TYPE:  INP   LOCATION:  4731                         FACILITY:  [facility]   PHYSICIAN:  Madolyn Frieze. Jens Som, MD, FACCDATE OF BIRTH:  08/09/1951   DATE OF ADMISSION:  08/20/2006  DATE OF DISCHARGE:  08/21/2006                           DISCHARGE SUMMARY - REFERRING   DISCHARGE DIAGNOSES:  1. Chest discomfort of uncertain etiology somewhat atypical.  2. Obesity.  3. Infected keloid status post I&D.  4. Noncompliance hypertension.  5. History of peripheral vascular disease.   DISCHARGING PHYSICIAN:  Dr. Jens Som.   SUMMARY OF HISTORY:  Nicholas [name] is a 60 year old African-American male who  presents to the emergency room after being referred from the urgent care  center for evaluation of right keloid abscess. During __________  x-rays at  urgent care center, he developed intermittent chest discomfort. He states  that he has had this for the preceding 4-5 months and describes it as  pressure left side of the chest sometimes radiating to the back lasting up  to several hours. Occurs with exertion and stress and/or rest. It is  relieved with deep inspiration. There is not any change with food or  symptoms of reflux, nausea or vomiting. Does have a vague history of  hematochezia and melena.   PAST MEDICAL HISTORY:  Notable for peripheral vascular disease with a left  fem-pop in February 2004, right carotid endarterectomy approximately 15  years ago, hypertension, coronary artery disease with myocardial infarction  and stent placement to the LAD in 1993, history of SVT, remote tobacco use.   LABORATORY DATA:  Soft tissue x-rays that were performed on the 11th did not  show any evidence of retropharyngeal abscess. Epiglottis was somewhat  obscured but appears prominent. Chest x-ray on the day of discharge did not  show any active disease.   H&H was 16 and 46.7, normal indices,  platelets 453, wbcs 7.8. Sodium 141,  potassium 4.1, BUN 7, creatinine 1.1, normal LFTs. CK-MB and troponins were  not indicative of myocardial infarction. Drug screen was unremarkable.  Urinalysis was unremarkable. Wound culture was notable for rare gram  positive cocci. Abscess culture did not show any organism. These were  preliminary results. EKG showed normal sinus rhythm, delayed R wave.   HOSPITAL COURSE:  Nicholas [name] was admitted with the resident and Dr.  Jens Som for further evaluation of his somewhat atypical chest discomfort.  It was noted that stools were heme-negative. Lopressor was started given his  hypertension and history of coronary artery disease. Consult was obtained  with  [facility] secondary to possible keloid abscess. He was  started on antibiotics and asked to follow up as an outpatient. By the 12th  he had not had any further chest discomfort or shortness of breath. Dr.  Jens Som after review felt that he had ruled out for myocardial infarction.  Felt that he could be discharged home with outpatient adenosine Myoview and  follow up with  Dr. Daleen Squibb as well as Dr. Orson Slick for consideration of keloid removal. He needs  to also establish himself  with a primary care physician.   DISPOSITION:  He is asked to maintain low salt, fat, cholesterol diet. He is  asked to continue wound care as per instructed with [facility]. He was asked to bring all medications to all follow-up  appointments. His new medications include Keflex 500 mg q.i.d. for 10 days,  Lopressor 25 mg b.i.d., Lipitor 80 mg q.h.s., nitroglycerin 0.4 as needed as  well as continuing his aspirin 325 mg daily. He will have an outpatient  adenosine Myoview on August 26, 2006 at 9:45 a.m. He will follow up with  Dr. Carmin Richmond. on September 03, 2006 at 9:45 a.m. It is noted that Dr. Daleen Squibb  does not have any appointments available. He was also asked to obtain a  primary care  physician and to let Dr. Vern Claude PA know at the time of follow-  up who this is. He was also asked to begin a blood pressure diary and to  bring this along with his medications to all appointments. He will probably  need blood work in about 6-8 weeks since Lipitor was initiated. Discharge  time greater than 30 minutes.     ______________________________  Joellyn Rued, PA-C    ______________________________  Madolyn Frieze. Jens Som, MD, [facility]    EW/MEDQ  D:  08/21/2006  T:  08/21/2006  Job:  [id number]   cc:   Daleen Squibb, M.D.

## 2011-04-27 NOTE — Assessment & Plan Note (Signed)
[facility]                              CARDIOLOGY OFFICE NOTE   Nicholas [name], Nicholas [name]                    MRN:          [id number]  DATE:09/03/2006                            DOB:          [birth year]    HOSPITAL VISIT:  This is a 60 year old African-American male patient who  presented to Urgent Care for evaluation of an infected keloid and developed  intermittent chest pain and was admitted to [facility] for further  evaluation.  His chest pain was somewhat atypical and he ruled out for a MI  and was discharged home for outpatient adenosine-Myoview.  Dr. Orson Slick saw  him for consideration of keloid removal.  He did have an I&D, was placed on  antibiotics and his surgery is being considered.   The patient does have a history of coronary artery disease, status post non-  Q-wave myocardial infarction in 1993 treated with stent to the LAD.  His  last cardiac catheterization was in 2004 at which time he had 20% left main,  20% proximal and mid-LAD with widely patent angioplasty site, 40%  circumflex, 40% OM, 30% RCA with intra-apical hypokinesis, ejection fraction  of 55-60%.  Treated with Dr. Alanda Amass but was not a stent treated by  emergency angioplasty in 1993.   Since the patient has been home, he denies any recurrent chest pain.  He  stated he never had exertional chest pain and it was just at rest where he  had some tightness and he would take aspirin with relief, but he has not had  this since then.  He walks four or five blocks every day without any  problems.  Adenosine-Cardiolite August 26, 2006 showed a small  anteroapical infarct with no ischemia.   CURRENT MEDICATIONS:  1. Ecotrin 325 mg daily.  2. Zocor 80 mg daily.  3. Cephalexin 500 daily.  4. Toprol 25 mg daily.   PHYSICAL EXAMINATION:  This is a pleasant 60 year old African-American male  in no acute distress.  Blood pressure 142/85, pulse 81.  Neck is without  JVD, HDR, bruit or thyroid enlargement.  He does have a large keloid on his  right neck.  Lungs are clear anterior, posterior and lateral.  Heart regular  rate and rhythm at 80 beats per minute, normal S1 and S2.  Positive S4.  No  significant murmur heard.  Abdomen is soft without organomegaly, masses,  lesions or abnormal tenderness.  Extremities without clubbing, cyanosis, or  edema.  He has good distal pulses.   EKG normal sinus rhythm with old anterior infarct and slight ST elevation  inferolaterally.  EKG is unchanged since 1994.   IMPRESSION:  1. Coronary artery disease, status post non-Q-wave myocardial infarction      in 1993 treated with urgent percutaneous transluminal coronary      angioplasty of the left anterior descending artery.  Last      catheterization in 2004.  Nonobstructive coronary artery disease.  2. Adenosine-Myoview on August 26, 2006:  A small anteroapical infarct      with no ischemia.  3. Status post right carotid endarterectomy.  4. Hypertension.  5. History of tobacco abuse.  6. Hyperlipidemia.  7. Infected keloid:  May require surgery.   PLAN:  At this time, the patient is stable from a cardiac standpoint and his  blood pressure is up.  I have increased his Toprol to 50 mg daily.  He is to  see Dr. Orson Slick today in follow up to schedule possible surgery.                                  Jacolyn Reedy, PA-C                              Veverly Fells. Excell Seltzer, MD   ML/MedQ  DD:  09/03/2006  DT:  09/05/2006  Job #:  [id number]   cc:   Lebron Conners, M.D.

## 2011-04-27 NOTE — Procedures (Signed)
[place]  Patient:    Nicholas [name], Nicholas [name]                    MRN: [id number] Proc. Date: 04/10/00 Adm. Date:  [id number] Attending:  Thyra Breed CC:         Reinaldo Meeker, M.D.                           Procedure Report  PREOPERATIVE DIAGNOSIS:  Lumbar spinal stenosis with underlying facet joint arthritis and degenerative disk disease.  POSTOPERATIVE DIAGNOSIS:  Lumbar spinal stenosis with underlying facet joint arthritis and degenerative disk disease.  PROCEDURE:  Lumbar epidural steroid injection.  SURGEON:  Thyra Breed, M.D.  ANESTHESIA:  INTERVAL HISTORY:  The patient has noted minimal response to the first injection. He had no untoward effects from it.  PHYSICAL EXAMINATION:  Blood pressure 147/90, heart rate 65, respiratory rate 18, and O2 saturations 96%, temperature is 97 degrees.  The pain level is 7 out of 0. His back shows good healing from his previous injection site.  Neurologic exam s grossly unchanged.  DESCRIPTION OF PROCEDURE:  After informed consent was obtained, the patient was  placed in the sitting position and monitored.  His back was prepped with Betadine x 3.  A skin level was raised at the L4-5 interspace with 1% lidocaine using a 25-gauge needle.  A 20-gauge Tuohy needle was introduced into the lumbar epidural space with loss of resistance to preservative-free normal saline.  There is no SF nor blood.  120 mg of Medrol and 10 ml of preservative-free normal saline was gently injected.  The needle was flushed with preservative-free normal saline and removed intact.  POST-PROCEDURE CONDITION:  Stable.  DISCHARGE INSTRUCTIONS: 1. Resume previous diet. 2. Limitation of activities per instruction sheet. 3. Continue with current medications. 4. The patient plans to see Reinaldo Meeker, M.D. next week. DD:  04/10/00 TD:  04/11/00 Job: [id number]

## 2011-04-27 NOTE — Cardiovascular Report (Signed)
   NAME:  Nicholas [name], Nicholas [name]                       ACCOUNT NO.:  [id number]   MEDICAL RECORD NO.:  [id number]                   PATIENT TYPE:  INP   LOCATION:  3743                                 FACILITY:  [facility]   PHYSICIAN:  Charlton Haws, M.D. [facility]              DATE OF BIRTH:  [birth year]   DATE OF PROCEDURE:  01/18/2003  DATE OF DISCHARGE:                              CARDIAC CATHETERIZATION   PROCEDURE:  Coronary arteriography.   INDICATION:  Previous angioplasty of the LAD with recurrent chest pain.   DESCRIPTION OF PROCEDURE:  __________  catheterization was done from the  right femoral artery.   Left main coronary artery was a short segment with a 20% discrete stenosis.   Left anterior descending artery had 20% multiple discrete lesions in the  proximal and mid-vessel.  Previous angioplasty site was widely patent.  First diagonal branch was normal.  Second diagonal branch was normal.   Circumflex coronary artery was co-dominant.  There was a 40% proximal  lesion.  First obtuse marginal branch had a 40% ostial lesion.   Distal vessel was normal size, diseased.   Right coronary artery was somewhat small but co-dominant.  There is a 30%  proximal lesion.   RAO ventriculography:  RAO ventriculography showed intra-apical hypokinesis.  Overall, however, the remainder of the ventricle was hyperdynamic and EF was  in the 55-60% range.  There was no mural clot.  During the ventriculography,  the patient had some atrial flutter at a rate of about 150; this was treated  successfully with 10 mg of labetalol.   IMPRESSION:  The patient has no recurrent coronary disease.  He had stopped  smoking three years ago.  He will continue to have his blood pressure  treated medically.  We will have our electrophysiology doctor see the  patient as he has had some recurrent atypical atrial flutter while in the  hospital; he may be a candidate for ablation.   As long as his leg heals well, he  will be discharged in the morning, unless  electrophysiology would like to do further workup for his arrhythmia.                                                Charlton Haws, M.D. [facility]    PN/MEDQ  D:  01/18/2003  T:  01/18/2003  Job:  [id number]   cc:   Thomas C. Wall, M.D. [facility]  [address]  [place]  [place] [address]  Fax: [phone]   Fleet Contras, M.D.  [address]  [place]  [place] [address]  Fax: [phone]

## 2011-04-30 ENCOUNTER — Other Ambulatory Visit: Payer: Self-pay | Admitting: Nurse Practitioner

## 2011-04-30 ENCOUNTER — Other Ambulatory Visit: Payer: Self-pay | Admitting: Cardiology

## 2011-04-30 NOTE — Assessment & Plan Note (Signed)
OFFICE VISIT  Nicholas [name], HARB DOB:  July 02, 1951                                       02/08/2011 FAOZH#:[id number]  The patient returns for followup today.  We are currently following him for a moderate carotid stenosis.  He previously underwent right carotid endarterectomy by Dr. Marcy Panning several years ago and this is known to be chronically occluded.  Since the patient was last seen he denies any symptoms of TIA, amaurosis or stroke.  He states that overall he has been doing well.  He has some residual left upper extremity weakness from a previous stroke.  CHRONIC MEDICAL PROBLEMS:  Remain elevated cholesterol, hypertension. He denies history of diabetes.  The above problems are currently controlled and followed by Dr. Daleen Squibb.  SOCIAL HISTORY:  He is widowed, has 2 children.  He is a nonsmoker, non consumer of alcohol.  REVIEW OF SYSTEMS:  He denies any shortness of breath or chest pain.  MEDICATIONS:  He continues to take pravastatin to lower his cholesterol as well as aspirin for antiplatelet therapy.  He is also on diltiazem, metoprolol and digoxin.  ALLERGIES:  He has no known drug allergies.  PHYSICAL EXAM:  Vital signs:  Blood pressure is 135/76 in the right arm, 130/63 in the left arm, oxygen saturation is 99% on room air, heart rate 60 and regular.  HEENT:  Unremarkable.  Neck:  Has 2+ carotid pulses without bruit bilaterally.  Chest:  Clear to auscultation.  Cardiac: Regular rate and rhythm with a 3/6 systolic murmur.  Neurological: Shows mild left upper extremity weakness.  He is unable to completely abduct the shoulder.  However, he is able to move the hand somewhat and flex at the elbow.  He had a carotid duplex exam today which shows chronic occlusion of the right carotid system.  He has a 40%-60% stenosis of the left internal carotid artery.  The vertebral arteries are antegrade flow bilaterally.  Overall the patient is stable.   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  He has a mild left internal carotid artery stenosis.  Will continue to follow this on a yearly basis for now.  He will continue with atherosclerotic risk factor modifications by treating his cholesterol and hypertension as well as continuing his antiplatelet therapy.    Janetta Hora. Jensyn Shave, MD Electronically Signed  CEF/MEDQ  D:  02/08/2011  T:  02/09/2011  Job:  [id number]  cc:   Thomas C. Wall, MD, [facility]

## 2011-05-29 ENCOUNTER — Other Ambulatory Visit: Payer: Self-pay | Admitting: Cardiology

## 2011-07-04 ENCOUNTER — Emergency Department (HOSPITAL_COMMUNITY)
Admission: EM | Admit: 2011-07-04 | Discharge: 2011-07-05 | Disposition: A | Discharge status: Home / Self Care | Payer: Medicare Other | Attending: Emergency Medicine | Admitting: Emergency Medicine

## 2011-07-04 DIAGNOSIS — R5381 Other malaise: Secondary | ICD-10-CM | POA: Insufficient documentation

## 2011-07-04 DIAGNOSIS — I252 Old myocardial infarction: Secondary | ICD-10-CM | POA: Insufficient documentation

## 2011-07-04 DIAGNOSIS — I491 Atrial premature depolarization: Secondary | ICD-10-CM | POA: Insufficient documentation

## 2011-07-04 DIAGNOSIS — I10 Essential (primary) hypertension: Secondary | ICD-10-CM | POA: Insufficient documentation

## 2011-07-04 DIAGNOSIS — R55 Syncope and collapse: Secondary | ICD-10-CM | POA: Insufficient documentation

## 2011-07-04 DIAGNOSIS — Z8679 Personal history of other diseases of the circulatory system: Secondary | ICD-10-CM | POA: Insufficient documentation

## 2011-07-04 LAB — BASIC METABOLIC PANEL
BUN: 11 mg/dL (ref 6–23)
Chloride: 101 mEq/L (ref 96–112)
Glucose, Bld: 174 mg/dL — ABNORMAL HIGH (ref 70–99)
Potassium: 4.1 mEq/L (ref 3.5–5.1)

## 2011-07-04 LAB — CBC
HCT: 42.2 % (ref 39.0–52.0)
MCHC: 36.3 g/dL — ABNORMAL HIGH (ref 30.0–36.0)
MCV: 93 fL (ref 78.0–100.0)
RDW: 12.1 % (ref 11.5–15.5)

## 2011-07-04 LAB — DIFFERENTIAL
Basophils Absolute: 0 10*3/uL (ref 0.0–0.1)
Eosinophils Relative: 3 % (ref 0–5)
Lymphocytes Relative: 26 % (ref 12–46)
Lymphs Abs: 2.5 10*3/uL (ref 0.7–4.0)
Monocytes Absolute: 1.1 10*3/uL — ABNORMAL HIGH (ref 0.1–1.0)

## 2011-07-08 ENCOUNTER — Other Ambulatory Visit: Payer: Self-pay | Admitting: Cardiology

## 2011-08-10 ENCOUNTER — Other Ambulatory Visit: Payer: Self-pay | Admitting: Cardiology

## 2011-08-17 ENCOUNTER — Other Ambulatory Visit: Payer: Self-pay | Admitting: *Deleted

## 2011-08-17 MED ORDER — METOPROLOL TARTRATE 50 MG PO TABS: 50.0000 mg | ORAL_TABLET | Freq: Two times a day (BID) | ORAL | Status: DC

## 2011-09-09 ENCOUNTER — Other Ambulatory Visit: Payer: Self-pay | Admitting: Internal Medicine

## 2011-09-11 ENCOUNTER — Other Ambulatory Visit: Payer: Self-pay | Admitting: Internal Medicine

## 2011-09-19 ENCOUNTER — Other Ambulatory Visit: Payer: Self-pay | Admitting: Cardiology

## 2011-09-25 ENCOUNTER — Inpatient Hospital Stay (INDEPENDENT_AMBULATORY_CARE_PROVIDER_SITE_OTHER)
Admission: RE | Admit: 2011-09-25 | Discharge: 2011-09-25 | Disposition: A | Discharge status: Home / Self Care | Payer: Medicare Other | Source: Ambulatory Visit | Attending: Family Medicine | Admitting: Family Medicine

## 2011-09-25 DIAGNOSIS — E119 Type 2 diabetes mellitus without complications: Secondary | ICD-10-CM

## 2011-09-25 LAB — POCT URINALYSIS DIP (DEVICE)
Bilirubin Urine: NEGATIVE
Hgb urine dipstick: NEGATIVE
Ketones, ur: NEGATIVE mg/dL
pH: 5.5 (ref 5.0–8.0)

## 2011-09-25 LAB — POCT I-STAT, CHEM 8
BUN: 9 mg/dL (ref 6–23)
Calcium, Ion: 1.17 mmol/L (ref 1.12–1.32)
Chloride: 100 mEq/L (ref 96–112)
Glucose, Bld: 508 mg/dL — ABNORMAL HIGH (ref 70–99)

## 2011-09-26 LAB — HEMOGLOBIN A1C: Hgb A1c MFr Bld: 12.7 % — ABNORMAL HIGH (ref <5.7)

## 2011-10-25 ENCOUNTER — Other Ambulatory Visit: Payer: Self-pay | Admitting: Internal Medicine

## 2012-01-21 ENCOUNTER — Other Ambulatory Visit: Payer: Self-pay

## 2012-01-21 MED ORDER — DIGOXIN 250 MCG PO TABS: 250.0000 ug | ORAL_TABLET | Freq: Every day | ORAL | Status: DC

## 2012-01-21 NOTE — Telephone Encounter (Signed)
..   Requested Prescriptions   Signed Prescriptions Disp Refills  . digoxin (LANOXIN) 0.25 MG tablet 30 tablet 2    Sig: Take 1 tablet (250 mcg total) by mouth daily.    Authorizing Provider: Hillis Range D    Ordering User: Christella Hartigan, Joshaua Epple Judie Petit

## 2012-02-08 ENCOUNTER — Ambulatory Visit (INDEPENDENT_AMBULATORY_CARE_PROVIDER_SITE_OTHER): Payer: Medicare Other | Admitting: *Deleted

## 2012-02-08 DIAGNOSIS — I6529 Occlusion and stenosis of unspecified carotid artery: Secondary | ICD-10-CM

## 2012-02-12 ENCOUNTER — Other Ambulatory Visit: Payer: Self-pay | Admitting: Cardiology

## 2012-02-12 ENCOUNTER — Other Ambulatory Visit: Payer: Self-pay

## 2012-02-12 MED ORDER — METOPROLOL TARTRATE 50 MG PO TABS: 50.0000 mg | ORAL_TABLET | Freq: Two times a day (BID) | ORAL | Status: DC

## 2012-02-13 ENCOUNTER — Other Ambulatory Visit: Payer: Self-pay | Admitting: *Deleted

## 2012-02-18 ENCOUNTER — Other Ambulatory Visit: Payer: Self-pay | Admitting: *Deleted

## 2012-02-18 ENCOUNTER — Encounter: Payer: Self-pay | Admitting: Vascular Surgery

## 2012-02-18 DIAGNOSIS — I6529 Occlusion and stenosis of unspecified carotid artery: Secondary | ICD-10-CM

## 2012-02-18 NOTE — Procedures (Unsigned)
CAROTID DUPLEX EXAM  INDICATION:  Left carotid stenosis, known right ICA occlusion.  HISTORY: Diabetes:  No. Cardiac:  No. Hypertension:  Yes. Smoking:  Previous. Previous Surgery:  Abscess removal in neck. CV History:  Currently asymptomatic. Amaurosis Fugax No, Paresthesias No, Hemiparesis No.                                      RIGHT             LEFT Brachial systolic pressure: Brachial Doppler waveforms: Vertebral direction of flow:                          Antegrade DUPLEX VELOCITIES (cm/sec) CCA peak systolic                                     200 ECA peak systolic                                     303 ICA peak systolic                                     221 ICA end diastolic                                     24 PLAQUE MORPHOLOGY:                                    Heterogenous PLAQUE AMOUNT:                                        Mild PLAQUE LOCATION:                                      ICA  IMPRESSION: 1. Known occlusion of right common carotid and internal carotid     arteries. 2. Doppler velocities suggest 40% to 59% stenosis of the left internal     carotid artery. 3. Velocities of the left carotid artery system may be increased due     to compensatory flow.  ___________________________________________ Nicholas Hora Fields, MD  EM/MEDQ  D:  02/08/2012  T:  02/08/2012  Job:  [id number]

## 2012-11-26 ENCOUNTER — Other Ambulatory Visit: Payer: Self-pay | Admitting: Internal Medicine

## 2013-02-11 ENCOUNTER — Encounter: Payer: Self-pay | Admitting: Neurosurgery

## 2013-02-12 ENCOUNTER — Other Ambulatory Visit: Payer: Self-pay | Admitting: *Deleted

## 2013-02-12 ENCOUNTER — Other Ambulatory Visit (INDEPENDENT_AMBULATORY_CARE_PROVIDER_SITE_OTHER): Payer: Medicare Other | Admitting: *Deleted

## 2013-02-12 ENCOUNTER — Ambulatory Visit (INDEPENDENT_AMBULATORY_CARE_PROVIDER_SITE_OTHER): Payer: Medicare Other | Admitting: Neurosurgery

## 2013-02-12 ENCOUNTER — Encounter: Payer: Self-pay | Admitting: Neurosurgery

## 2013-02-12 DIAGNOSIS — I6529 Occlusion and stenosis of unspecified carotid artery: Secondary | ICD-10-CM

## 2013-02-12 NOTE — Progress Notes (Signed)
[facility] Carotid Office Note  CC: Carotid surveillance Referring Physician: Fields  History of Present Illness: 62 year old male patient of Dr. Darrick Penna with known right ICA occlusion. The patient denies any signs or symptoms of CVA, TIA, amaurosis fugax. Patient does have a history of stroke in the past with some residual weakness bilaterally, left greater than right in the upper extremity.  Past Medical History  Diagnosis Date  . Carotid artery occlusion   . Hypertension   . Hyperlipidemia   . Diabetes mellitus without complication     ROS: [x]  Positive   [ ]  Denies    General: [ ]  Weight loss, [ ]  Fever, [ ]  chills Neurologic: [ ]  Dizziness, [ ]  Blackouts, [ ]  Seizure [ ]  Stroke, [ ]  "Mini stroke", [ ]  Slurred speech, [ ]  Temporary blindness; [ ]  weakness in arms or legs, [ ]  Hoarseness Cardiac: [ ]  Chest pain/pressure, [ ]  Shortness of breath at rest [ ]  Shortness of breath with exertion, [ ]  Atrial fibrillation or irregular heartbeat Vascular: [ ]  Pain in legs with walking, [ ]  Pain in legs at rest, [ ]  Pain in legs at night,  [ ]  Non-healing ulcer, [ ]  Blood clot in vein/DVT,   Pulmonary: [ ]  Home oxygen, [ ]  Productive cough, [ ]  Coughing up blood, [ ]  Asthma,  [ ]  Wheezing Musculoskeletal:  [ ]  Arthritis, [ ]  Low back pain, [ ]  Joint pain Hematologic: [ ]  Easy Bruising, [ ]  Anemia; [ ]  Hepatitis Gastrointestinal: [ ]  Blood in stool, [ ]  Gastroesophageal Reflux/heartburn, [ ]  Trouble swallowing Urinary: [ ]  chronic Kidney disease, [ ]  on HD - [ ]  MWF or [ ]  TTHS, [ ]  Burning with urination, [ ]  Difficulty urinating Skin: [ ]  Rashes, [ ]  Wounds Psychological: [ ]  Anxiety, [ ]  Depression   Social History History  Substance Use Topics  . Smoking status: Never Smoker   . Smokeless tobacco: Not on file  . Alcohol Use: No    Family History No family history on file.  No Known Allergies  Current Outpatient Prescriptions  Medication Sig  Dispense Refill  . aspirin 325 MG EC tablet Take 325 mg by mouth daily.      . digoxin (LANOXIN) 0.25 MG tablet Take 1 tablet (250 mcg total) by mouth daily.  30 tablet  2  . glipiZIDE (GLUCOTROL XL) 5 MG 24 hr tablet Take 5 mg by mouth daily.      . metFORMIN (GLUCOPHAGE) 1000 MG tablet Take 1,000 mg by mouth 2 (two) times daily with a meal.      . metoprolol (LOPRESSOR) 50 MG tablet Take 1 tablet (50 mg total) by mouth 2 (two) times daily.  60 tablet  3  . TAZTIA XT 360 MG 24 hr capsule TAKE 1 CAPSULE BY MOUTH EVERY DAY  90 capsule  1   No current facility-administered medications for this visit.    Physical Examination  Filed Vitals:   02/12/13 1219  BP: 121/63  Pulse: 59  Resp:     Body mass index is 34.58 kg/(m^2).  General:  WDWN in NAD Gait: Normal HEENT: WNL Eyes: Pupils equal Pulmonary: normal non-labored breathing , without Rales, rhonchi,  wheezing Cardiac: RRR, without  Murmurs, rubs or gallops; Abdomen: soft, NT, no masses Skin: no rashes, ulcers noted  Vascular Exam Pulses: 3+ radial pulses Carotid bruits: Carotid pulse on the left with a known right occlusion Extremities without ischemic changes, no Gangrene ,  no cellulitis; no open wounds;  Musculoskeletal: no muscle wasting or atrophy   Neurologic: A&O X 3; Appropriate Affect ; SENSATION: normal; MOTOR FUNCTION:  moving all extremities equally. Speech is fluent/normal  Non-Invasive Vascular Imaging CAROTID DUPLEX 02/12/2013  Right ICA 0ccluded stenosis Left ICA 20 - 39 % stenosis   ASSESSMENT/PLAN: Asymptomatic patient will followup in one year with repeat left carotid duplex, the patient's questions were encouraged and answered, he is in agreement with this plan.  Lauree Chandler ANP   Clinic MD: Darrick Penna

## 2013-10-20 ENCOUNTER — Other Ambulatory Visit: Payer: Self-pay | Admitting: Urology

## 2013-10-22 ENCOUNTER — Encounter (HOSPITAL_BASED_OUTPATIENT_CLINIC_OR_DEPARTMENT_OTHER): Payer: Self-pay | Admitting: *Deleted

## 2013-10-23 ENCOUNTER — Encounter (HOSPITAL_BASED_OUTPATIENT_CLINIC_OR_DEPARTMENT_OTHER): Payer: Self-pay | Admitting: *Deleted

## 2013-10-26 ENCOUNTER — Encounter (HOSPITAL_BASED_OUTPATIENT_CLINIC_OR_DEPARTMENT_OTHER): Payer: Self-pay | Admitting: *Deleted

## 2013-10-26 NOTE — Progress Notes (Signed)
10/26/13 1207  OBSTRUCTIVE SLEEP APNEA  Have you ever been diagnosed with sleep apnea through a sleep study? No  Do you snore loudly (loud enough to be heard through closed doors)?  0  Do you often feel tired, fatigued, or sleepy during the daytime? 0  Has anyone observed you stop breathing during your sleep? 0  Do you have, or are you being treated for high blood pressure? 1  BMI more than 35 kg/m2? 1  Age over 62 years old? 1  Gender: 1  Obstructive Sleep Apnea Score 4

## 2013-10-26 NOTE — Progress Notes (Signed)
SPOKE W/ DAUGHTER, [name] [name].  NPO AFTER MN. ARRIVE AT 0830. NEEDS ISTAT AND EKG. WILL TAKE TAZTIA, LOPRESSOR, AND DIGOXIN AM DOS W/ SIPS OF WATER. PT WALKS W/ CANE RIGHT LEG WEAK.

## 2013-10-27 ENCOUNTER — Encounter (HOSPITAL_BASED_OUTPATIENT_CLINIC_OR_DEPARTMENT_OTHER): Payer: Medicare Other | Admitting: Anesthesiology

## 2013-10-27 ENCOUNTER — Ambulatory Visit (HOSPITAL_BASED_OUTPATIENT_CLINIC_OR_DEPARTMENT_OTHER): Payer: Medicare Other | Admitting: Anesthesiology

## 2013-10-27 ENCOUNTER — Encounter (HOSPITAL_BASED_OUTPATIENT_CLINIC_OR_DEPARTMENT_OTHER): Payer: Self-pay | Admitting: Anesthesiology

## 2013-10-27 ENCOUNTER — Encounter (HOSPITAL_BASED_OUTPATIENT_CLINIC_OR_DEPARTMENT_OTHER)
Admission: RE | Disposition: A | Discharge status: Home / Self Care | Payer: Self-pay | Source: Ambulatory Visit | Attending: Urology

## 2013-10-27 ENCOUNTER — Ambulatory Visit (HOSPITAL_BASED_OUTPATIENT_CLINIC_OR_DEPARTMENT_OTHER)
Admission: RE | Admit: 2013-10-27 | Discharge: 2013-10-27 | Disposition: A | Discharge status: Home / Self Care | Payer: Medicare Other | Source: Ambulatory Visit | Attending: Urology | Admitting: Urology

## 2013-10-27 DIAGNOSIS — Z87891 Personal history of nicotine dependence: Secondary | ICD-10-CM | POA: Insufficient documentation

## 2013-10-27 DIAGNOSIS — Z8673 Personal history of transient ischemic attack (TIA), and cerebral infarction without residual deficits: Secondary | ICD-10-CM | POA: Insufficient documentation

## 2013-10-27 DIAGNOSIS — I252 Old myocardial infarction: Secondary | ICD-10-CM | POA: Insufficient documentation

## 2013-10-27 DIAGNOSIS — I10 Essential (primary) hypertension: Secondary | ICD-10-CM | POA: Insufficient documentation

## 2013-10-27 DIAGNOSIS — N508 Other specified disorders of male genital organs: Secondary | ICD-10-CM | POA: Insufficient documentation

## 2013-10-27 DIAGNOSIS — N433 Hydrocele, unspecified: Secondary | ICD-10-CM | POA: Insufficient documentation

## 2013-10-27 DIAGNOSIS — E119 Type 2 diabetes mellitus without complications: Secondary | ICD-10-CM | POA: Insufficient documentation

## 2013-10-27 HISTORY — PX: HYDROCELE EXCISION: SHX482

## 2013-10-27 HISTORY — DX: Personal history of other (healed) physical injury and trauma: Z87.828

## 2013-10-27 HISTORY — DX: Old myocardial infarction: I25.2

## 2013-10-27 HISTORY — DX: Peripheral vascular disease, unspecified: I73.9

## 2013-10-27 HISTORY — DX: Personal history of transient ischemic attack (TIA), and cerebral infarction without residual deficits: Z86.73

## 2013-10-27 HISTORY — DX: Hydrocele, unspecified: N43.3

## 2013-10-27 HISTORY — DX: Occlusion and stenosis of left carotid artery: I65.22

## 2013-10-27 HISTORY — DX: Supraventricular tachycardia, unspecified: I47.10

## 2013-10-27 HISTORY — DX: History of falling: Z91.81

## 2013-10-27 HISTORY — DX: Benign prostatic hyperplasia without lower urinary tract symptoms: N40.0

## 2013-10-27 HISTORY — DX: Atherosclerotic heart disease of native coronary artery without angina pectoris: I25.10

## 2013-10-27 HISTORY — DX: Supraventricular tachycardia: I47.1

## 2013-10-27 LAB — POCT I-STAT 4, (NA,K, GLUC, HGB,HCT)
Hemoglobin: 14.6 g/dL (ref 13.0–17.0)
Potassium: 3.7 mEq/L (ref 3.5–5.1)
Sodium: 142 mEq/L (ref 135–145)

## 2013-10-27 SURGERY — HYDROCELECTOMY
Anesthesia: General | Site: Scrotum | Laterality: Left | Wound class: Clean

## 2013-10-27 MED ORDER — STERILE WATER FOR IRRIGATION IR SOLN
Status: DC | PRN
  Administered 2013-10-27: 500 mL

## 2013-10-27 MED ORDER — HYDROCODONE-ACETAMINOPHEN 5-325 MG PO TABS: 1.0000 | ORAL_TABLET | Freq: Four times a day (QID) | ORAL | Status: DC | PRN

## 2013-10-27 MED ORDER — ONDANSETRON HCL 4 MG/2ML IJ SOLN
INTRAMUSCULAR | Status: DC | PRN
  Administered 2013-10-27: 4 mg via INTRAVENOUS

## 2013-10-27 MED ORDER — COLLODION LIQD
Status: DC | PRN
  Administered 2013-10-27: 1 via TOPICAL

## 2013-10-27 MED ORDER — LACTATED RINGERS IV SOLN
INTRAVENOUS | Status: DC
  Administered 2013-10-27 (×2): via INTRAVENOUS
  Filled 2013-10-27: qty 1000

## 2013-10-27 MED ORDER — FENTANYL CITRATE 0.05 MG/ML IJ SOLN
25.0000 ug | INTRAMUSCULAR | Status: DC | PRN
  Administered 2013-10-27: 25 ug via INTRAVENOUS
  Filled 2013-10-27: qty 1

## 2013-10-27 MED ORDER — FENTANYL CITRATE 0.05 MG/ML IJ SOLN
INTRAMUSCULAR | Status: DC | PRN
  Administered 2013-10-27: 50 ug via INTRAVENOUS
  Administered 2013-10-27: 25 ug via INTRAVENOUS

## 2013-10-27 MED ORDER — DEXAMETHASONE SODIUM PHOSPHATE 4 MG/ML IJ SOLN
INTRAMUSCULAR | Status: DC | PRN
  Administered 2013-10-27: 8 mg via INTRAVENOUS

## 2013-10-27 MED ORDER — BUPIVACAINE HCL (PF) 0.25 % IJ SOLN
INTRAMUSCULAR | Status: DC | PRN
  Administered 2013-10-27: 6 mL

## 2013-10-27 MED ORDER — CEFAZOLIN SODIUM 1-5 GM-% IV SOLN
1.0000 g | INTRAVENOUS | Status: DC
  Filled 2013-10-27: qty 50

## 2013-10-27 MED ORDER — CEFAZOLIN SODIUM-DEXTROSE 2-3 GM-% IV SOLR
2.0000 g | INTRAVENOUS | Status: AC
  Administered 2013-10-27: 2 g via INTRAVENOUS
  Filled 2013-10-27: qty 50

## 2013-10-27 MED ORDER — PROMETHAZINE HCL 25 MG/ML IJ SOLN
6.2500 mg | INTRAMUSCULAR | Status: DC | PRN
  Filled 2013-10-27: qty 1

## 2013-10-27 MED ORDER — LIDOCAINE HCL (CARDIAC) 20 MG/ML IV SOLN
INTRAVENOUS | Status: DC | PRN
  Administered 2013-10-27: 60 mg via INTRAVENOUS

## 2013-10-27 MED ORDER — HYDROCODONE-ACETAMINOPHEN 5-325 MG PO TABS
1.0000 | ORAL_TABLET | Freq: Four times a day (QID) | ORAL | Status: DC | PRN
  Administered 2013-10-27: 1 via ORAL
  Filled 2013-10-27: qty 1

## 2013-10-27 MED ORDER — PROPOFOL 10 MG/ML IV BOLUS
INTRAVENOUS | Status: DC | PRN
  Administered 2013-10-27: 300 mg via INTRAVENOUS

## 2013-10-27 SURGICAL SUPPLY — 42 items
APL SKNCLS STERI-STRIP NONHPOA (GAUZE/BANDAGES/DRESSINGS)
APPLICATOR COTTON TIP 6IN STRL (MISCELLANEOUS) ×2 IMPLANT
BANDAGE GAUZE ELAST BULKY 4 IN (GAUZE/BANDAGES/DRESSINGS) ×2 IMPLANT
BENZOIN TINCTURE PRP APPL 2/3 (GAUZE/BANDAGES/DRESSINGS) IMPLANT
BLADE SURG 15 STRL LF DISP TIS (BLADE) ×1 IMPLANT
BLADE SURG 15 STRL SS (BLADE) ×2
BLADE SURG ROTATE 9660 (MISCELLANEOUS) ×2 IMPLANT
CANISTER SUCTION 1200CC (MISCELLANEOUS) IMPLANT
CANISTER SUCTION 2500CC (MISCELLANEOUS) ×1 IMPLANT
CLOTH BEACON ORANGE TIMEOUT ST (SAFETY) ×2 IMPLANT
COVER MAYO STAND STRL (DRAPES) ×2 IMPLANT
COVER TABLE BACK 60X90 (DRAPES) ×2 IMPLANT
DRAPE PED LAPAROTOMY (DRAPES) ×2 IMPLANT
ELECT NDL TIP 2.8 STRL (NEEDLE) ×1 IMPLANT
ELECT NEEDLE TIP 2.8 STRL (NEEDLE) ×2 IMPLANT
ELECT REM PT RETURN 9FT ADLT (ELECTROSURGICAL) ×2
ELECTRODE REM PT RTRN 9FT ADLT (ELECTROSURGICAL) ×1 IMPLANT
GLOVE BIO SURGEON STRL SZ7 (GLOVE) ×2 IMPLANT
GLOVE INDICATOR 7.5 STRL GRN (GLOVE) ×3 IMPLANT
GOWN STRL REIN XL XLG (GOWN DISPOSABLE) ×1 IMPLANT
GOWN W/2 COTTON TOWELS 2 STD (GOWNS) ×1 IMPLANT
MARKER SKIN DUAL TIP RULER LAB (MISCELLANEOUS) ×2 IMPLANT
NDL HYPO 25X1 1.5 SAFETY (NEEDLE) IMPLANT
NEEDLE HYPO 25X1 1.5 SAFETY (NEEDLE) IMPLANT
NS IRRIG 500ML POUR BTL (IV SOLUTION) ×2 IMPLANT
PACK BASIN DAY SURGERY FS (CUSTOM PROCEDURE TRAY) ×2 IMPLANT
PAD PREP 24X48 CUFFED NSTRL (MISCELLANEOUS) ×2 IMPLANT
PENCIL BUTTON HOLSTER BLD 10FT (ELECTRODE) ×2 IMPLANT
STRIP CLOSURE SKIN 1/2X4 (GAUZE/BANDAGES/DRESSINGS) IMPLANT
SUPPORT SCROTAL LG STRP (MISCELLANEOUS) ×2 IMPLANT
SUT CHROMIC 3 0 PS 2 (SUTURE) ×2 IMPLANT
SUT CHROMIC 3 0 SH 27 (SUTURE) ×4 IMPLANT
SUT MON AB 4-0 PC3 18 (SUTURE) IMPLANT
SUT SILK 2 0 SH (SUTURE) IMPLANT
SUT VIC AB 3-0 SH 27 (SUTURE)
SUT VIC AB 3-0 SH 27X BRD (SUTURE) IMPLANT
SUT VIC AB 4-0 SH 27 (SUTURE) ×2
SUT VIC AB 4-0 SH 27XANBCTRL (SUTURE) IMPLANT
SUT VICRYL 0 TIES 12 18 (SUTURE) IMPLANT
SYR CONTROL 10ML LL (SYRINGE) IMPLANT
WATER STERILE IRR 500ML POUR (IV SOLUTION) ×2 IMPLANT
YANKAUER SUCT BULB TIP NO VENT (SUCTIONS) ×2 IMPLANT

## 2013-10-27 NOTE — Anesthesia Postprocedure Evaluation (Signed)
  Anesthesia Post-op Note  Patient: Nicholas [name]  Procedure(s) Performed: Procedure(s) (LRB): HYDROCELECTOMY ADULT (Left)  Patient Location: PACU  Anesthesia Type: General  Level of Consciousness: awake and alert   Airway and Oxygen Therapy: Patient Spontanous Breathing  Post-op Pain: mild  Post-op Assessment: Post-op Vital signs reviewed, Patient's Cardiovascular Status Stable, Respiratory Function Stable, Patent Airway and No signs of Nausea or vomiting  Last Vitals:  Filed Vitals:   10/27/13 1130  BP: 125/50  Pulse: 62  Temp:   Resp: 15    Post-op Vital Signs: stable   Complications: No apparent anesthesia complications

## 2013-10-27 NOTE — Anesthesia Procedure Notes (Signed)
Procedure Name: LMA Insertion Date/Time: 10/27/2013 10:14 AM Performed by: Norva Pavlov Pre-anesthesia Checklist: Patient identified, Emergency Drugs available, Suction available and Patient being monitored Patient Re-evaluated:Patient Re-evaluated prior to inductionOxygen Delivery Method: Circle System Utilized Preoxygenation: Pre-oxygenation with 100% oxygen Intubation Type: IV induction Ventilation: Mask ventilation without difficulty LMA: LMA inserted LMA Size: 5.0 Number of attempts: 1 Airway Equipment and Method: bite block Placement Confirmation: positive ETCO2 Tube secured with: Tape Dental Injury: Teeth and Oropharynx as per pre-operative assessment

## 2013-10-27 NOTE — Anesthesia Preprocedure Evaluation (Addendum)
Anesthesia Evaluation  Patient identified by MRN, date of birth, ID band Patient awake    Reviewed: Allergy & Precautions, H&P , NPO status , Patient's Chart, lab work & pertinent test results  Airway Mallampati: III TM Distance: <3 FB Neck ROM: Full    Dental no notable dental hx.    Pulmonary former smoker,  breath sounds clear to auscultation  + decreased breath sounds      Cardiovascular hypertension, Pt. on medications and Pt. on home beta blockers + CAD, + Past MI and + Peripheral Vascular Disease + Valvular Problems/Murmurs AI Rhythm:Regular Rate:Normal + Systolic murmurs   Clinical Lists Changes   Observations: Added new observation of CARDIO HPI: I have reviewed the echo.  It does show calcification of the right cusp with eccentric aortic insufficiency there is mild.  This explains the murmur.  There is no significant pulmonic insufficiency. (11/08/2010 11:42)  EF 55% Added new observation of PAST MED HX: CAD  urgent PCI  LAD.Marland Kitchen 1993  /   cath  2004...nonobstructive disease  /  nuclear 2007..small antero-apical scar..no ischemia    Neuro/Psych negative neurological ROS  negative psych ROS   GI/Hepatic negative GI ROS, Neg liver ROS,   Endo/Other  diabetes, Oral Hypoglycemic AgentsMorbid obesity  Renal/GU negative Renal ROS  negative genitourinary   Musculoskeletal negative musculoskeletal ROS (+)   Abdominal   Peds negative pediatric ROS (+)  Hematology negative hematology ROS (+)   Anesthesia Other Findings   Reproductive/Obstetrics negative OB ROS                         Anesthesia Physical Anesthesia Plan  ASA: III  Anesthesia Plan: General   Post-op Pain Management:    Induction: Intravenous  Airway Management Planned: LMA  Additional Equipment:   Intra-op Plan:   Post-operative Plan:   Informed Consent: I have reviewed the patients History and Physical, chart, labs  and discussed the procedure including the risks, benefits and alternatives for the proposed anesthesia with the patient or authorized representative who has indicated his/her understanding and acceptance.   Dental advisory given  Plan Discussed with: CRNA and Surgeon  Anesthesia Plan Comments:         Anesthesia Quick Evaluation

## 2013-10-27 NOTE — Transfer of Care (Signed)
Immediate Anesthesia Transfer of Care Note  Patient: Nicholas [name]  Procedure(s) Performed: Procedure(s) (LRB): HYDROCELECTOMY ADULT (Left)  Patient Location: PACU  Anesthesia Type: General  Level of Consciousness:drowsy  Airway & Oxygen Therapy: Patient Spontanous Breathing and Patient connected to face mask oxygen  Post-op Assessment: Report given to PACU RN and Post -op Vital signs reviewed and stable  Post vital signs: Reviewed and stable  Complications: No apparent anesthesia complications

## 2013-10-27 NOTE — H&P (Signed)
History of Present Illness Nicholas [name] is referred by Dr Alwyn Pea. For the past month he has been having increasing scrotal swelling and left groin pain. The swelling is associated with discomfort. He had right hydrocelectomy in 2004 by Dr Vernie Ammons. He has nocturia x 5. Scrotal ultrasound shows left hydrocele.     Past Medical History Problems  1. History of acute myocardial infarction (412) 2. History of hypertension (V12.59) 3. History of transient cerebral ischemia (V12.54)  Surgical History Problems  1. History of Carotid Artery Exploration 2. History of Femoral Artery Exploration 3. History of Heart Surgery  Current Meds 1. Digoxin TABS;  Therapy: (Recorded:10Nov2014) to Recorded 2. Ecotrin 325 MG Oral Tablet Delayed Release;  Therapy: (Recorded:10Nov2014) to Recorded 3. GlipiZIDE ER 5 MG Oral Tablet Extended Release 24 Hour;  Therapy: (Recorded:10Nov2014) to Recorded 4. MetFORMIN HCl - 1000 MG Oral Tablet;  Therapy: (Recorded:10Nov2014) to Recorded 5. Metoprolol Tartrate 50 MG Oral Tablet;  Therapy: (Recorded:10Nov2014) to Recorded 6. Taztia XT 360 MG Oral Capsule Extended Release 24 Hour;  Therapy: (Recorded:10Nov2014) to Recorded  Allergies Medication  1. No Known Drug Allergies  Family History Problems  1. Family history of cardiac disorder (V17.49) : Brother  Social History Problems    Denied: History of Alcohol use   Denied: History of Caffeine use   Disabled   Divorced   Father deceased   Former smoker Chemical engineer)   History of smoking (V15.82)   Mother deceased   Number of children  Review of Systems Genitourinary, constitutional, skin, eye, otolaryngeal, hematologic/lymphatic, cardiovascular, pulmonary, endocrine, musculoskeletal, gastrointestinal, neurological and psychiatric system(s) were reviewed and pertinent findings if present are noted.  Genitourinary: nocturia, scrotal pain, scrotal swelling and inguinal pain.    Vitals Vital  Signs [Data Includes: Last 1 Day]  Recorded: 10Nov2014 02:08PM  Height: 6 ft  Weight: 250 lb  BMI Calculated: 33.91 BSA Calculated: 2.34 Blood Pressure: 149 / 69 Temperature: 98.8 F Heart Rate: 63 Respiration: 18  Physical Exam Constitutional: Well nourished and well developed . No acute distress.  ENT:. The ears and nose are normal in appearance.  Neck: The appearance of the neck is normal and no neck mass is present.  Pulmonary: No respiratory distress and normal respiratory rhythm and effort.  Cardiovascular: Heart rate and rhythm are normal . No peripheral edema.  Abdomen: The abdomen is soft and nontender. No masses are palpated. No CVA tenderness. No hernias are palpable. No hepatosplenomegaly noted.  Rectal: Rectal exam demonstrates normal sphincter tone, no tenderness and no masses. Estimated prostate size is 2+. The prostate has no nodularity and is not tender. The left seminal vesicle is nonpalpable. The right seminal vesicle is nonpalpable. The perineum is normal on inspection.  Genitourinary: Examination of the penis demonstrates no discharge, no masses, no lesions and a normal meatus. The scrotum is without lesions. Examination of the right scrotum demonstrates no hydrocele. Examination of the left scrotum demostrates a hydrocele. The right epididymis is palpably normal and non-tender. The left epididymis is non-tender. The right spermatic cord is palpably normal. The left spermatic cord is palpably normal. The right testis is palpably normal, non-tender and without masses. The left testis is nonpalpable, non-tender and without masses. The left scrotum is swollen and tender.  Lymphatics: The femoral and inguinal nodes are not enlarged or tender.  Skin: Normal skin turgor, no visible rash and no visible skin lesions.  Neuro/Psych:. Mood and affect are appropriate.    Results/Data Urine [Data Includes: Last 1 Day]  10Nov2014  COLOR AMBER   APPEARANCE CLEAR   SPECIFIC GRAVITY  >1.030   pH 5.5   GLUCOSE NEG mg/dL  BILIRUBIN NEG   KETONE 15 mg/dL  BLOOD NEG   PROTEIN NEG mg/dL  UROBILINOGEN 0.2 mg/dL  NITRITE NEG   LEUKOCYTE ESTERASE NEG    SCROTAL ULTRASOUND  INDICATION: Swelling and pain left scrotum.  The right testis measures 4.7 x 2.2 x 3.1 cm.  The left testis measures 4.1 x 3.0 x 3.3 cm. There is a 7.0 x 4.8 x 6.9 cm hypoechoic area of the scrotum.   There is no varicocele.  There is good flow to both testicles.  IMPRESSION: Left hydrocele.   Assessment Assessed  1. Hydrocele, left (603.9) 2. Prostate cancer screening (V76.44)  Plan Health Maintenance  1. UA With REFLEX; Status:Resulted - Requires Verification;   Done: 10Nov2014 03:00PM Hydrocele, left  2. SCROTAL U/S; Status:Resulted - Requires Verification;   Done: 10Nov2014 12:00AM Prostate cancer screening  3. PSA REFLEX TO FREE; Status:In Progress - Specimen/Data Collected;   Done:  10Nov2014 4. VENIPUNCTURE; Status:In Progress - Specimen/Data Collected;   Done: 10Nov2014  Check PSA. I discussed the treatment options of the hydrocele with him: conservative management versus hydrocelectomy. The procedure, risks, benefits were discussed with the patient. The risks include but are not limited to hemorrhage, hematoma, infection, recurrence. He understands and states that he prefers to have it excised.

## 2013-10-27 NOTE — Op Note (Signed)
Nicholas [name] is a 62 y.o.   10/27/2013  General  Preop diagnosis: Left hydrocele  Postop diagnosis: Left hydrocele, epididymal cysts  Procedure done: Left hydrocelectomy, excision of epididymal cysts  Surgeon: Wendie Simmer. Maronda Caison  Anesthesia: General  Indication: Patient is a 62 years old male who had been complaining of increasing swelling of the scrotum associated with left groin pain. The swelling is associated with discomfort. He had a right hydrocelectomy in 2004. Scrotal ultrasound showed a left hydrocele. The patient wants to have it excised. He is scheduled today for the procedure  Procedure: The patient was identified by his wrist band and proper timeout was taken.  Under general anesthesia he was prepped and draped and placed in the supine position. The scrotum was infiltrated with 0.25% Marcaine. Then a longitudinal incision was made on the scrotum. The incision was carried down to the tunica vaginalis which was then incised. About 150 cc of yellowish fluid were drained out of the hydrocele sac. There were several cysts in the head of the epididymis. Another hemorrhagic cyst was also noted at the tail of the epididymis. All cysts were excised. At the site of the excision of the cyst the tissues were approximated with #4-0 Vicryl. Hemostasis was secured with electrocautery. The tunica vaginalis was then imbricated with #3-0 chromic using the Lord's technique. The wound was then irrigated with normal saline. The testicle was then replaced in the scrotum. The incision was closed in 2 layers with #3-0 chromic. Sterile eye dressing was then applied to the wound.  EBL: Minimal  Needles, sponges count: Correct.  The patient tolerated the procedure well and left the OR in satisfactory condition to postanesthesia care unit.  CC: Dr. Alwyn Pea

## 2013-10-28 ENCOUNTER — Encounter (HOSPITAL_BASED_OUTPATIENT_CLINIC_OR_DEPARTMENT_OTHER): Payer: Self-pay | Admitting: Urology

## 2013-11-28 ENCOUNTER — Emergency Department (HOSPITAL_COMMUNITY)
Admission: EM | Admit: 2013-11-28 | Discharge: 2013-11-28 | Disposition: A | Discharge status: Home / Self Care | Payer: Medicare Other | Attending: Emergency Medicine | Admitting: Emergency Medicine

## 2013-11-28 ENCOUNTER — Encounter (HOSPITAL_COMMUNITY): Payer: Self-pay | Admitting: Emergency Medicine

## 2013-11-28 DIAGNOSIS — E119 Type 2 diabetes mellitus without complications: Secondary | ICD-10-CM | POA: Insufficient documentation

## 2013-11-28 DIAGNOSIS — Z7982 Long term (current) use of aspirin: Secondary | ICD-10-CM | POA: Insufficient documentation

## 2013-11-28 DIAGNOSIS — L0211 Cutaneous abscess of neck: Secondary | ICD-10-CM | POA: Insufficient documentation

## 2013-11-28 DIAGNOSIS — I2510 Atherosclerotic heart disease of native coronary artery without angina pectoris: Secondary | ICD-10-CM | POA: Insufficient documentation

## 2013-11-28 DIAGNOSIS — Z792 Long term (current) use of antibiotics: Secondary | ICD-10-CM | POA: Insufficient documentation

## 2013-11-28 DIAGNOSIS — Z8673 Personal history of transient ischemic attack (TIA), and cerebral infarction without residual deficits: Secondary | ICD-10-CM | POA: Insufficient documentation

## 2013-11-28 DIAGNOSIS — I252 Old myocardial infarction: Secondary | ICD-10-CM | POA: Insufficient documentation

## 2013-11-28 DIAGNOSIS — I10 Essential (primary) hypertension: Secondary | ICD-10-CM | POA: Insufficient documentation

## 2013-11-28 DIAGNOSIS — Z9181 History of falling: Secondary | ICD-10-CM | POA: Insufficient documentation

## 2013-11-28 DIAGNOSIS — Z9861 Coronary angioplasty status: Secondary | ICD-10-CM | POA: Insufficient documentation

## 2013-11-28 DIAGNOSIS — Z8781 Personal history of (healed) traumatic fracture: Secondary | ICD-10-CM | POA: Insufficient documentation

## 2013-11-28 DIAGNOSIS — Z87448 Personal history of other diseases of urinary system: Secondary | ICD-10-CM | POA: Insufficient documentation

## 2013-11-28 DIAGNOSIS — Z79899 Other long term (current) drug therapy: Secondary | ICD-10-CM | POA: Insufficient documentation

## 2013-11-28 DIAGNOSIS — Z87891 Personal history of nicotine dependence: Secondary | ICD-10-CM | POA: Insufficient documentation

## 2013-11-28 MED ORDER — SULFAMETHOXAZOLE-TMP DS 800-160 MG PO TABS: 2.0000 | ORAL_TABLET | Freq: Two times a day (BID) | ORAL | Status: DC

## 2013-11-28 MED ORDER — OXYCODONE-ACETAMINOPHEN 5-325 MG PO TABS: 1.0000 | ORAL_TABLET | Freq: Four times a day (QID) | ORAL | Status: DC | PRN

## 2013-11-28 MED ORDER — OXYCODONE-ACETAMINOPHEN 5-325 MG PO TABS
1.0000 | ORAL_TABLET | Freq: Once | ORAL | Status: AC
  Administered 2013-11-28: 1 via ORAL
  Filled 2013-11-28: qty 1

## 2013-11-28 NOTE — ED Provider Notes (Signed)
CSN: [id number]     Arrival date & time 11/28/13  0908 History   First MD Initiated Contact with Patient 11/28/13 [phone]     Chief Complaint  Patient presents with  . Recurrent Skin Infections   (Consider location/radiation/quality/duration/timing/severity/associated sxs/prior Treatment) The history is provided by the patient.   patient presents with a possible abscess on his right neck. He has a keloid scar from previous neck surgery. He states he's had infections in the past. He states last day or 2 has become more swollen and more painful. He has had some drainage. No fevers chills. No cough.  Past Medical History  Diagnosis Date  . Hypertension   . Hyperlipidemia   . Type 2 diabetes mellitus   . History of myocardial infarction     1993--  NON-Q WAVE  S/P PCI TO LAD  . History of CVA (cerebrovascular accident)     1993---  NO RESIDUALS  . Hydrocele, left   . BPH (benign prostatic hypertrophy)     HX RETENTION  . PSVT (paroxysmal supraventricular tachycardia)   . Coronary artery disease CARDIOLOGIST--  DR Johney Frame    S/P  PCI TO LAD 1993  . Carotid artery occlusion     S/P RIGHT CEA  . PVD (peripheral vascular disease)     S/P LEFT FEM-POP  . Left carotid artery stenosis     MILD  . History of head injury     CONCUSSION--  NO RESIDUAL  . Right leg weakness     USES CANE--  SECONDARY TO PVD  . At high risk for falls   . Wears glasses    Past Surgical History  Procedure Laterality Date  . Carotid endarterectomy Right 05-31-2003  . Femoral-popliteal bypass graft Left 01-19-2003  . Right hydrocelectomy  05-31-2003  . Coronary angioplasty  1993    PCI TO LAD  . Cardiac catheterization  01-18-2003   DR NISHAN    NON-OBSTRUCTIVE CAD/  PREVIOIS ANGIOPLASTY SITE WIDELY PATENT  . Cardiovascular stress test  2007    SMALL ANTERO-APICAL SCAR/  NO ISCHEMIA  . Transthoracic echocardiogram  08-24-2010    EF 55%/  MILD AORTIC INSUFFICENCY/  MODERATE LVH  . Hydrocele excision Left  10/27/2013    Procedure: HYDROCELECTOMY ADULT;  Surgeon: Lindaann Slough, MD;  Location: [facility];  Service: Urology;  Laterality: Left;   No family history on file. History  Substance Use Topics  . Smoking status: Former Smoker -- 0 years    Types: Cigarettes    Quit date: 10/26/1993  . Smokeless tobacco: Never Used  . Alcohol Use: No    Review of Systems  Constitutional: Negative for fever and chills.  HENT: Negative for sore throat, trouble swallowing and voice change.   Skin: Positive for wound. Negative for color change.    Allergies  Review of patient's allergies indicates no known allergies.  Home Medications   Current Outpatient Rx  Name  Route  Sig  Dispense  Refill  . aspirin 325 MG EC tablet   Oral   Take 325 mg by mouth daily.         . digoxin (LANOXIN) 0.25 MG tablet   Oral   Take 250 mcg by mouth every morning.         . diltiazem (TAZTIA XT) 360 MG 24 hr capsule      TAKE 1 CAPSULE BY MOUTH EVERY DAY---  TAKES IN AM         . glipiZIDE (  GLUCOTROL XL) 5 MG 24 hr tablet   Oral   Take 5 mg by mouth daily.         Marland Kitchen HYDROcodone-acetaminophen (NORCO) 5-325 MG per tablet   Oral   Take 1 tablet by mouth every 6 (six) hours as needed for moderate pain.   30 tablet   0   . metFORMIN (GLUCOPHAGE) 1000 MG tablet   Oral   Take 1,000 mg by mouth 2 (two) times daily with a meal.         . metoprolol (LOPRESSOR) 50 MG tablet   Oral   Take 1 tablet (50 mg total) by mouth 2 (two) times daily.   60 tablet   3   . oxyCODONE-acetaminophen (PERCOCET/ROXICET) 5-325 MG per tablet   Oral   Take 1-2 tablets by mouth every 6 (six) hours as needed for severe pain.   10 tablet   0   . sulfamethoxazole-trimethoprim (BACTRIM DS) 800-160 MG per tablet   Oral   Take 2 tablets by mouth 2 (two) times daily.   20 tablet   0    BP 142/63  Pulse 69  Temp(Src) 98.5 F (36.9 C) (Oral)  Resp 20  SpO2 97% Physical Exam  Constitutional:  He appears well-developed and well-nourished.  HENT:  Head: Normocephalic and atraumatic.  Neck:  Keloid scar down right side of neck along the sternocleidomastoid area. Remainder aspect medially on the scar there is a purulent drainage. No fluctuance. No erythema.  Cardiovascular: Normal rate.   Pulmonary/Chest: Effort normal and breath sounds normal. No respiratory distress. He has no wheezes.  Skin: Skin is warm.    ED Course  Procedures (including critical care time) Labs Review Labs Reviewed - No data to display Imaging Review No results found.  EKG Interpretation   None       MDM   1. Abscess of skin of neck    Patient with a keloid scar on right neck. There is some purulent drainage but no clear abscess. Wound was probed and no pockets seen or felt. We'll treat with antibiotics to cover possible reaction. Patient did warm soaks. Will followup as needed.    Juliet Rude. Rubin Payor, MD 11/28/13 [phone]

## 2013-11-28 NOTE — ED Notes (Signed)
Pt from home, c/o old scar to R side of neck that may have abscess. Pt reports that scar is approx 62 years old, but has hx of recurrent abscesses that have to be drained. Pt is A&O and in NAD

## 2014-01-14 ENCOUNTER — Other Ambulatory Visit: Payer: Self-pay | Admitting: Neurosurgery

## 2014-01-14 DIAGNOSIS — Z8673 Personal history of transient ischemic attack (TIA), and cerebral infarction without residual deficits: Secondary | ICD-10-CM

## 2014-01-14 DIAGNOSIS — I6529 Occlusion and stenosis of unspecified carotid artery: Secondary | ICD-10-CM

## 2014-02-18 ENCOUNTER — Other Ambulatory Visit (HOSPITAL_COMMUNITY): Payer: Medicare Other

## 2014-02-18 ENCOUNTER — Ambulatory Visit: Payer: Medicare Other | Admitting: Family

## 2014-02-24 ENCOUNTER — Encounter: Payer: Self-pay | Admitting: Family

## 2014-02-25 ENCOUNTER — Inpatient Hospital Stay (HOSPITAL_COMMUNITY): Admission: RE | Admit: 2014-02-25 | Payer: Medicare Other | Source: Ambulatory Visit

## 2014-02-25 ENCOUNTER — Ambulatory Visit: Payer: Medicare Other | Admitting: Family

## 2014-04-26 ENCOUNTER — Inpatient Hospital Stay (HOSPITAL_COMMUNITY): Payer: Medicare Other

## 2014-04-26 ENCOUNTER — Encounter (HOSPITAL_COMMUNITY): Payer: Self-pay | Admitting: Emergency Medicine

## 2014-04-26 ENCOUNTER — Emergency Department (HOSPITAL_COMMUNITY): Payer: Medicare Other

## 2014-04-26 ENCOUNTER — Inpatient Hospital Stay (HOSPITAL_COMMUNITY)
Admission: EM | Admit: 2014-04-26 | Discharge: 2014-04-28 | DRG: 064 | Disposition: A | Discharge status: Rehabilitation Facility | Payer: Medicare Other | Attending: Internal Medicine | Admitting: Internal Medicine

## 2014-04-26 DIAGNOSIS — E1149 Type 2 diabetes mellitus with other diabetic neurological complication: Secondary | ICD-10-CM | POA: Diagnosis present

## 2014-04-26 DIAGNOSIS — I252 Old myocardial infarction: Secondary | ICD-10-CM

## 2014-04-26 DIAGNOSIS — R29810 Facial weakness: Secondary | ICD-10-CM | POA: Diagnosis present

## 2014-04-26 DIAGNOSIS — I2510 Atherosclerotic heart disease of native coronary artery without angina pectoris: Secondary | ICD-10-CM

## 2014-04-26 DIAGNOSIS — K219 Gastro-esophageal reflux disease without esophagitis: Secondary | ICD-10-CM | POA: Diagnosis present

## 2014-04-26 DIAGNOSIS — Z8679 Personal history of other diseases of the circulatory system: Secondary | ICD-10-CM

## 2014-04-26 DIAGNOSIS — I6350 Cerebral infarction due to unspecified occlusion or stenosis of unspecified cerebral artery: Principal | ICD-10-CM | POA: Diagnosis present

## 2014-04-26 DIAGNOSIS — I471 Supraventricular tachycardia: Secondary | ICD-10-CM

## 2014-04-26 DIAGNOSIS — Z87891 Personal history of nicotine dependence: Secondary | ICD-10-CM

## 2014-04-26 DIAGNOSIS — R29898 Other symptoms and signs involving the musculoskeletal system: Secondary | ICD-10-CM | POA: Diagnosis present

## 2014-04-26 DIAGNOSIS — R319 Hematuria, unspecified: Secondary | ICD-10-CM

## 2014-04-26 DIAGNOSIS — Z7982 Long term (current) use of aspirin: Secondary | ICD-10-CM

## 2014-04-26 DIAGNOSIS — I359 Nonrheumatic aortic valve disorder, unspecified: Secondary | ICD-10-CM | POA: Diagnosis present

## 2014-04-26 DIAGNOSIS — N400 Enlarged prostate without lower urinary tract symptoms: Secondary | ICD-10-CM | POA: Diagnosis present

## 2014-04-26 DIAGNOSIS — Z9861 Coronary angioplasty status: Secondary | ICD-10-CM

## 2014-04-26 DIAGNOSIS — E785 Hyperlipidemia, unspecified: Secondary | ICD-10-CM | POA: Diagnosis present

## 2014-04-26 DIAGNOSIS — Z8249 Family history of ischemic heart disease and other diseases of the circulatory system: Secondary | ICD-10-CM

## 2014-04-26 DIAGNOSIS — R4182 Altered mental status, unspecified: Secondary | ICD-10-CM

## 2014-04-26 DIAGNOSIS — E782 Mixed hyperlipidemia: Secondary | ICD-10-CM | POA: Diagnosis present

## 2014-04-26 DIAGNOSIS — E1142 Type 2 diabetes mellitus with diabetic polyneuropathy: Secondary | ICD-10-CM | POA: Diagnosis present

## 2014-04-26 DIAGNOSIS — I10 Essential (primary) hypertension: Secondary | ICD-10-CM | POA: Diagnosis present

## 2014-04-26 DIAGNOSIS — Z823 Family history of stroke: Secondary | ICD-10-CM

## 2014-04-26 DIAGNOSIS — Z79899 Other long term (current) drug therapy: Secondary | ICD-10-CM

## 2014-04-26 DIAGNOSIS — I739 Peripheral vascular disease, unspecified: Secondary | ICD-10-CM

## 2014-04-26 DIAGNOSIS — R109 Unspecified abdominal pain: Secondary | ICD-10-CM | POA: Diagnosis present

## 2014-04-26 DIAGNOSIS — I639 Cerebral infarction, unspecified: Secondary | ICD-10-CM | POA: Diagnosis present

## 2014-04-26 DIAGNOSIS — F17200 Nicotine dependence, unspecified, uncomplicated: Secondary | ICD-10-CM

## 2014-04-26 DIAGNOSIS — G9340 Encephalopathy, unspecified: Secondary | ICD-10-CM | POA: Diagnosis present

## 2014-04-26 HISTORY — DX: Gastro-esophageal reflux disease without esophagitis: K21.9

## 2014-04-26 LAB — COMPREHENSIVE METABOLIC PANEL
ALT: 28 U/L (ref 0–53)
ALT: 28 U/L (ref 0–53)
AST: 28 U/L (ref 0–37)
AST: 30 U/L (ref 0–37)
Albumin: 3.9 g/dL (ref 3.5–5.2)
Albumin: 3.8 g/dL (ref 3.5–5.2)
Alkaline Phosphatase: 77 U/L (ref 39–117)
Alkaline Phosphatase: 80 U/L (ref 39–117)
BILIRUBIN TOTAL: 0.8 mg/dL (ref 0.3–1.2)
BUN: 8 mg/dL (ref 6–23)
BUN: 7 mg/dL (ref 6–23)
CALCIUM: 9.4 mg/dL (ref 8.4–10.5)
CO2: 25 mEq/L (ref 19–32)
CO2: 24 meq/L (ref 19–32)
CREATININE: 0.92 mg/dL (ref 0.50–1.35)
CREATININE: 0.97 mg/dL (ref 0.50–1.35)
Calcium: 9.2 mg/dL (ref 8.4–10.5)
Chloride: 102 mEq/L (ref 96–112)
Chloride: 102 mEq/L (ref 96–112)
GFR calc Af Amer: >90 mL/min (ref >90)
GFR calc Af Amer: >90 mL/min (ref >90)
GFR, EST NON AFRICAN AMERICAN: 89 mL/min — AB (ref >90)
GFR, EST NON AFRICAN AMERICAN: 87 mL/min — AB (ref >90)
Glucose, Bld: 108 mg/dL — ABNORMAL HIGH (ref 70–99)
Glucose, Bld: 93 mg/dL (ref 70–99)
Potassium: 3.8 mEq/L (ref 3.7–5.3)
Potassium: 3.9 mEq/L (ref 3.7–5.3)
SODIUM: 140 meq/L (ref 137–147)
Sodium: 140 mEq/L (ref 137–147)
TOTAL PROTEIN: 7.3 g/dL (ref 6.0–8.3)
Total Bilirubin: 0.7 mg/dL (ref 0.3–1.2)
Total Protein: 6.9 g/dL (ref 6.0–8.3)

## 2014-04-26 LAB — RAPID URINE DRUG SCREEN, HOSP PERFORMED
AMPHETAMINES: NOT DETECTED
Barbiturates: NOT DETECTED
Benzodiazepines: NOT DETECTED
COCAINE: NOT DETECTED
OPIATES: NOT DETECTED
Tetrahydrocannabinol: NOT DETECTED

## 2014-04-26 LAB — GLUCOSE, CAPILLARY
GLUCOSE-CAPILLARY: 99 mg/dL (ref 70–99)
GLUCOSE-CAPILLARY: 168 mg/dL — AB (ref 70–99)
Glucose-Capillary: 123 mg/dL — ABNORMAL HIGH (ref 70–99)
Glucose-Capillary: 142 mg/dL — ABNORMAL HIGH (ref 70–99)

## 2014-04-26 LAB — CBC WITH DIFFERENTIAL/PLATELET
BASOS ABS: 0 10*3/uL (ref 0.0–0.1)
Basophils Relative: 0 % (ref 0–1)
EOS ABS: 0.4 10*3/uL (ref 0.0–0.7)
EOS PCT: 5 % (ref 0–5)
HCT: 41.5 % (ref 39.0–52.0)
Hemoglobin: 14.1 g/dL (ref 13.0–17.0)
Lymphocytes Relative: 19 % (ref 12–46)
Lymphs Abs: 1.5 10*3/uL (ref 0.7–4.0)
MCH: 33.2 pg (ref 26.0–34.0)
MCHC: 34 g/dL (ref 30.0–36.0)
MCV: 97.6 fL (ref 78.0–100.0)
Monocytes Absolute: 1.1 10*3/uL — ABNORMAL HIGH (ref 0.1–1.0)
Monocytes Relative: 14 % — ABNORMAL HIGH (ref 3–12)
Neutro Abs: 5 10*3/uL (ref 1.7–7.7)
Neutrophils Relative %: 62 % (ref 43–77)
PLATELETS: 329 10*3/uL (ref 150–400)
RBC: 4.25 MIL/uL (ref 4.22–5.81)
RDW: 12.7 % (ref 11.5–15.5)
WBC: 7.9 10*3/uL (ref 4.0–10.5)

## 2014-04-26 LAB — I-STAT TROPONIN, ED: Troponin i, poc: 0.02 ng/mL (ref 0.00–0.08)

## 2014-04-26 LAB — URINALYSIS, ROUTINE W REFLEX MICROSCOPIC
Bilirubin Urine: NEGATIVE
Glucose, UA: NEGATIVE mg/dL
Hgb urine dipstick: NEGATIVE
KETONES UR: NEGATIVE mg/dL
Leukocytes, UA: NEGATIVE
NITRITE: NEGATIVE
Protein, ur: NEGATIVE mg/dL
Specific Gravity, Urine: 1.019 (ref 1.005–1.030)
UROBILINOGEN UA: 1 mg/dL (ref 0.0–1.0)
pH: 7.5 (ref 5.0–8.0)

## 2014-04-26 LAB — CBC
HCT: 39.9 % (ref 39.0–52.0)
Hemoglobin: 13.8 g/dL (ref 13.0–17.0)
MCH: 32.9 pg (ref 26.0–34.0)
MCHC: 34.6 g/dL (ref 30.0–36.0)
MCV: 95.2 fL (ref 78.0–100.0)
Platelets: 361 10*3/uL (ref 150–400)
RBC: 4.19 MIL/uL — ABNORMAL LOW (ref 4.22–5.81)
RDW: 12.4 % (ref 11.5–15.5)
WBC: 7.5 10*3/uL (ref 4.0–10.5)

## 2014-04-26 LAB — TSH: TSH: 1.37 u[IU]/mL (ref 0.350–4.500)

## 2014-04-26 LAB — TROPONIN I: Troponin I: <0.3 ng/mL (ref <0.30)

## 2014-04-26 LAB — CK: Total CK: 487 U/L — ABNORMAL HIGH (ref 7–232)

## 2014-04-26 LAB — CBG MONITORING, ED: Glucose-Capillary: 92 mg/dL (ref 70–99)

## 2014-04-26 LAB — ETHANOL: Alcohol, Ethyl (B): <11 mg/dL (ref 0–11)

## 2014-04-26 MED ORDER — KETOROLAC TROMETHAMINE 30 MG/ML IJ SOLN
INTRAMUSCULAR | Status: AC
  Administered 2014-04-26: 15 mg
  Filled 2014-04-26: qty 1

## 2014-04-26 MED ORDER — KETOROLAC TROMETHAMINE 15 MG/ML IJ SOLN
15.0000 mg | Freq: Four times a day (QID) | INTRAMUSCULAR | Status: DC | PRN
  Filled 2014-04-26: qty 1

## 2014-04-26 MED ORDER — ENOXAPARIN SODIUM 40 MG/0.4ML ~~LOC~~ SOLN
40.0000 mg | SUBCUTANEOUS | Status: DC
  Administered 2014-04-26 – 2014-04-28 (×3): 40 mg via SUBCUTANEOUS
  Filled 2014-04-26 (×3): qty 0.4

## 2014-04-26 MED ORDER — DOXAZOSIN MESYLATE 2 MG PO TABS
2.0000 mg | ORAL_TABLET | Freq: Every day | ORAL | Status: DC
  Administered 2014-04-27 – 2014-04-28 (×2): 2 mg via ORAL
  Filled 2014-04-26 (×3): qty 1

## 2014-04-26 MED ORDER — DILTIAZEM HCL ER BEADS 240 MG PO CP24
360.0000 mg | ORAL_CAPSULE | Freq: Every day | ORAL | Status: DC
  Administered 2014-04-27 – 2014-04-28 (×2): 360 mg via ORAL
  Filled 2014-04-26 (×3): qty 1

## 2014-04-26 MED ORDER — BISACODYL 10 MG RE SUPP
10.0000 mg | Freq: Once | RECTAL | Status: DC
  Filled 2014-04-26: qty 1

## 2014-04-26 MED ORDER — STROKE: EARLY STAGES OF RECOVERY BOOK
Freq: Once | Status: AC
Start: 2014-04-26 — End: 2014-04-27
  Administered 2014-04-27: 02:00:00
  Filled 2014-04-26: qty 1

## 2014-04-26 MED ORDER — METOPROLOL TARTRATE 50 MG PO TABS
50.0000 mg | ORAL_TABLET | Freq: Two times a day (BID) | ORAL | Status: DC
  Administered 2014-04-26 – 2014-04-28 (×4): 50 mg via ORAL
  Filled 2014-04-26 (×6): qty 1

## 2014-04-26 MED ORDER — ASPIRIN EC 325 MG PO TBEC: 325.0000 mg | DELAYED_RELEASE_TABLET | Freq: Every day | ORAL | Status: DC

## 2014-04-26 MED ORDER — ASPIRIN 300 MG RE SUPP
300.0000 mg | Freq: Every day | RECTAL | Status: DC
  Administered 2014-04-26: 300 mg via RECTAL
  Filled 2014-04-26 (×3): qty 1

## 2014-04-26 MED ORDER — DILTIAZEM HCL 25 MG/5ML IV SOLN
5.0000 mg | INTRAVENOUS | Status: DC | PRN
  Filled 2014-04-26 (×2): qty 5

## 2014-04-26 MED ORDER — SODIUM CHLORIDE 0.9 % IV SOLN
INTRAVENOUS | Status: AC
  Administered 2014-04-26: 12:00:00 via INTRAVENOUS

## 2014-04-26 MED ORDER — ASPIRIN 325 MG PO TABS
325.0000 mg | ORAL_TABLET | Freq: Every day | ORAL | Status: DC
  Administered 2014-04-27: 325 mg via ORAL
  Filled 2014-04-26 (×3): qty 1

## 2014-04-26 MED ORDER — INSULIN ASPART 100 UNIT/ML ~~LOC~~ SOLN
0.0000 [IU] | Freq: Three times a day (TID) | SUBCUTANEOUS | Status: DC
  Administered 2014-04-26: 1 [IU] via SUBCUTANEOUS
  Administered 2014-04-26 – 2014-04-27 (×2): 2 [IU] via SUBCUTANEOUS
  Administered 2014-04-28: 1 [IU] via SUBCUTANEOUS
  Administered 2014-04-28: 2 [IU] via SUBCUTANEOUS

## 2014-04-26 MED ORDER — SENNOSIDES-DOCUSATE SODIUM 8.6-50 MG PO TABS
1.0000 | ORAL_TABLET | Freq: Every evening | ORAL | Status: DC | PRN
  Administered 2014-04-28: 1 via ORAL
  Filled 2014-04-26: qty 1

## 2014-04-26 MED ORDER — DOCUSATE SODIUM 100 MG PO CAPS
100.0000 mg | ORAL_CAPSULE | Freq: Two times a day (BID) | ORAL | Status: DC
  Administered 2014-04-26 – 2014-04-28 (×4): 100 mg via ORAL
  Filled 2014-04-26 (×6): qty 1

## 2014-04-26 MED ORDER — ATORVASTATIN CALCIUM 20 MG PO TABS
20.0000 mg | ORAL_TABLET | Freq: Every day | ORAL | Status: DC
  Administered 2014-04-26: 20 mg via ORAL
  Filled 2014-04-26 (×2): qty 1

## 2014-04-26 MED ORDER — GLIPIZIDE ER 5 MG PO TB24
5.0000 mg | ORAL_TABLET | Freq: Every day | ORAL | Status: DC
  Administered 2014-04-27 – 2014-04-28 (×2): 5 mg via ORAL
  Filled 2014-04-26 (×3): qty 1

## 2014-04-26 NOTE — Care Management Note (Unsigned)
    Page 1 of 1   04/28/2014     10:09:11 AM CARE MANAGEMENT NOTE 04/28/2014  Patient:  Nicholas [name],Nicholas [name]   Account Number:  [id number]  Date Initiated:  04/26/2014  Documentation initiated by:  GRAVES-BIGELOW,Kayler Buckholtz  Subjective/Objective Assessment:   Pt admitted with MRI confirms this as a stroke in the left ACA territory. Pt is from home alone.     Action/Plan:   CM will continue to monitor for disposition needs.   Anticipated DC Date:  04/28/2014   Anticipated DC Plan:  HOME W HOME HEALTH SERVICES      DC Planning Services  CM consult      Choice offered to / List presented to:             Status of service:  In process, will continue to follow Medicare Important Message given?  YES (If response is "NO", the following Medicare IM given date fields will be blank) Date Medicare IM given:  04/26/2014 Date Additional Medicare IM given:    Discharge Disposition:    Per UR Regulation:  Reviewed for med. necessity/level of care/duration of stay  If discussed at Long Length of Stay Meetings, dates discussed:    Comments:  04-28-14 [address] Tomi BambergerBrenda Graves-Bigelow, [place],BSN [phone] CIR consult placed. CIR MD to assess to see if appropriate for CIR. CM will continue to monitor for disposition needs.

## 2014-04-26 NOTE — ED Provider Notes (Signed)
CSN: [id number]     Arrival date & time 04/26/14  9147 History   First MD Initiated Contact with Patient 04/26/14 [phone]     Chief Complaint  Patient presents with  . Hematuria  . Weakness     (Consider location/radiation/quality/duration/timing/severity/associated sxs/prior Treatment) HPI  This a 63 year old male with history of hypertension, hyperlipidemia, diabetes, CVA, and coronary artery disease who presents with generalized weakness and hematuria.  Patient is generally uncooperative with exam and will not provide much history. Per the patient's family, he is complaining of hematuria for the last day. He is also noted back pain and wonders if he may have passed a kidney stone. Per EMS, he was found on the floor. When I asked the patient he passed out he said "yes." He will not elaborate. He denies any chest pain or shortness of breath. He denies any focal weakness or numbness. He denies any recent fevers. He currently denies any pain. He reports his history of syncope.  Patient reports dizziness and states he feels off balance. He denies room spinning dizziness.  Denies alcohol or drug use.  Of note, patient inappropriately laughing during exam questioning. He appears to have difficulty with attention and attention past. He is oriented x2 (disoriented to time). Last seen normal yesterday.  Level V caveat for altered mental status.  Past Medical History  Diagnosis Date  . Hypertension   . Hyperlipidemia   . Type 2 diabetes mellitus   . History of myocardial infarction     1993--  NON-Q WAVE  S/P PCI TO LAD  . History of CVA (cerebrovascular accident)     1993---  NO RESIDUALS  . Hydrocele, left   . BPH (benign prostatic hypertrophy)     HX RETENTION  . PSVT (paroxysmal supraventricular tachycardia)   . Coronary artery disease CARDIOLOGIST--  DR Johney Frame    S/P  PCI TO LAD 1993  . Carotid artery occlusion     S/P RIGHT CEA  . PVD (peripheral vascular disease)     S/P LEFT FEM-POP   . Left carotid artery stenosis     MILD  . History of head injury     CONCUSSION--  NO RESIDUAL  . Right leg weakness     USES CANE--  SECONDARY TO PVD  . At high risk for falls   . Wears glasses    Past Surgical History  Procedure Laterality Date  . Carotid endarterectomy Right 05-31-2003  . Femoral-popliteal bypass graft Left 01-19-2003  . Right hydrocelectomy  05-31-2003  . Coronary angioplasty  1993    PCI TO LAD  . Cardiac catheterization  01-18-2003   DR NISHAN    NON-OBSTRUCTIVE CAD/  PREVIOIS ANGIOPLASTY SITE WIDELY PATENT  . Cardiovascular stress test  2007    SMALL ANTERO-APICAL SCAR/  NO ISCHEMIA  . Transthoracic echocardiogram  08-24-2010    EF 55%/  MILD AORTIC INSUFFICENCY/  MODERATE LVH  . Hydrocele excision Left 10/27/2013    Procedure: HYDROCELECTOMY ADULT;  Surgeon: Lindaann Slough, MD;  Location: [facility];  Service: Urology;  Laterality: Left;   History reviewed. No pertinent family history. History  Substance Use Topics  . Smoking status: Former Smoker -- 0 years    Types: Cigarettes    Quit date: 10/26/1993  . Smokeless tobacco: Never Used  . Alcohol Use: No    Review of Systems  Constitutional: Negative for fever.  Respiratory: Negative.  Negative for cough, chest tightness and shortness of breath.   Cardiovascular:  Negative.  Negative for chest pain.  Gastrointestinal: Negative.  Negative for nausea, vomiting and abdominal pain.  Genitourinary: Positive for hematuria. Negative for dysuria.  Musculoskeletal: Positive for back pain.  Skin: Negative for rash.  Neurological: Positive for weakness. Negative for numbness and headaches.  Psychiatric/Behavioral: Positive for confusion.  All other systems reviewed and are negative.     Allergies  Review of patient's allergies indicates no known allergies.  Home Medications   Prior to Admission medications   Medication Sig Start Date End Date Taking? Authorizing Provider   aspirin 325 MG EC tablet Take 325 mg by mouth daily.   Yes Historical Provider, MD  diltiazem (TAZTIA XT) 360 MG 24 hr capsule Take 360 mg by mouth daily.   Yes Historical Provider, MD  docusate sodium (COLACE) 100 MG capsule Take 100 mg by mouth 2 (two) times daily.   Yes Historical Provider, MD  doxazosin (CARDURA) 2 MG tablet Take 2 mg by mouth daily.   Yes Historical Provider, MD  glipiZIDE (GLUCOTROL XL) 5 MG 24 hr tablet Take 5 mg by mouth daily.   Yes Historical Provider, MD  metFORMIN (GLUCOPHAGE) 1000 MG tablet Take 1,000 mg by mouth 2 (two) times daily with a meal.   Yes Historical Provider, MD  metoprolol (LOPRESSOR) 50 MG tablet Take 1 tablet (50 mg total) by mouth 2 (two) times daily. 02/12/12  Yes Luis AbedJeffrey D Katz, MD  rosuvastatin (CRESTOR) 10 MG tablet Take 10 mg by mouth daily.   Yes Historical Provider, MD   BP 163/58  Pulse 82  Temp(Src) 98.2 F (36.8 C) (Rectal)  Resp 24  SpO2 95% Physical Exam  Nursing note and vitals reviewed. Constitutional: No distress.  Overweight  HENT:  Head: Normocephalic and atraumatic.  Mouth/Throat: Oropharynx is clear and moist.  Eyes: EOM are normal. Pupils are equal, round, and reactive to light.  Neck: Neck supple. JVD present.  Cardiovascular: Normal rate and regular rhythm.   Murmur heard. Pulmonary/Chest: Effort normal and breath sounds normal. No respiratory distress. He has no wheezes. He has no rales.  Abdominal: Soft. Bowel sounds are normal. There is no tenderness. There is no rebound.  Musculoskeletal: Normal range of motion. He exhibits edema.  Lymphadenopathy:    He has no cervical adenopathy.  Neurological: He is alert.  Oriented x2, follows simple commands, inappropriate affect, difficulty with attention to task, no dysmetria noted to finger-nose-finger, 5 out of 5 strength with grip and proximal bilateral upper extremity strength, no facial droop noted, patient will not cooperate with lower extremity exam - will not lift  either leg, no clonus noted  Skin: Skin is warm and dry.  Psychiatric:  Inappropriate affect, laughing inappropriately    ED Course  Procedures (including critical care time) Labs Review Labs Reviewed  CBC - Abnormal; Notable for the following:    RBC 4.19 (*)    All other components within normal limits  COMPREHENSIVE METABOLIC PANEL - Abnormal; Notable for the following:    Glucose, Bld 108 (*)    GFR calc non Af Amer 89 (*)    All other components within normal limits  ETHANOL  URINALYSIS, ROUTINE W REFLEX MICROSCOPIC  URINE RAPID DRUG SCREEN (HOSP PERFORMED)  I-STAT TROPOININ, ED    Imaging Review Ct Head Wo Contrast  04/26/2014   CLINICAL DATA:  Weakness.  EXAM: CT HEAD WITHOUT CONTRAST  TECHNIQUE: Contiguous axial images were obtained from the base of the skull through the vertex without intravenous contrast.  COMPARISON:  CT NECK W/CM dated 07/03/2010; CT HEAD W/O CM dated 04/02/2009  FINDINGS: The ventricles and sulci are normal for age. No intraparenchymal hemorrhage, mass effect nor midline shift. Patchy supratentorial white matter hypodensities are within normal range for patient's age and though non-specific suggest sequelae of chronic small vessel ischemic disease. Focal loss of the left medial frontal/ cingulate gray-white matter differentiation (axial 20/31). Right frontoparietal cystic encephalomalacia with mild ex vacuo dilatation of the subjacent ventricle.  No abnormal extra-axial fluid collections. Basal cisterns are patent. Moderate calcific atherosclerosis of the carotid siphons.  No skull fracture. The included ocular globes and orbital contents are non-suspicious. Mild paranasal sinus mucosal thickening without air-fluid levels.  IMPRESSION: Focal loss of the left medial frontal gray white matter differentiation concerning for acute to subacute ischemia, left anterior cerebral artery territory. MRI of the brain with diffusion-weighted sequences would be more sensitive.   Remote right frontoparietal infarcts.  Findings discussed with and reconfirmed by Dr.COURTNEY, HORTON on5/18/2015at4:39 AM.   Electronically Signed   By: Awilda Metroourtnay  Bloomer   On: 04/26/2014 04:40     EKG Interpretation   Date/Time:  Monday Apr 26 2014 03:33:38 EDT Ventricular Rate:  78 PR Interval:  184 QRS Duration: 81 QT Interval:  331 QTC Calculation: 377 R Axis:   31 Text Interpretation:  Sinus rhythm Left atrial enlargement similar to EKG  11/2002 Confirmed by HORTON  MD, COURTNEY ([id number]) on 04/26/2014 4:35:48 AM      MDM   Final diagnoses:  Stroke  Hematuria  Altered mental status    Patient presents with altered level status and hematuria. He has a strange affect on exam and appears to be inappropriate at times. He sometimes will refuse to answer my questions.  No obvious focal deficit; however, patient is inconsistent with exam and sometimes uncooperative. He will not lift either lower extremity. Altered mental status workup initiated included head CT. Questionable syncope. EKG without arrhythmia. Troponin negative. Received a call from radiology. Patient has acute versus subacute left frontal infarct. Unsure of how this is related to current presentation but maybe explains his mental status. Discussed with Dr. Roseanne RenoStewart. [facility]  Other screening lab work at this time reassuring. Family was updated at the bedside.    Shon Batonourtney F Horton, MD 04/26/14 [phone]

## 2014-04-26 NOTE — ED Notes (Signed)
Bed: RESA Expected date:  Expected time:  Means of arrival:  Comments: EMS weakness  

## 2014-04-26 NOTE — ED Notes (Signed)
Pt is lethargic, reports bright red blood in urine since yesterday.

## 2014-04-26 NOTE — Progress Notes (Signed)
Pt arrived to 3W03 via [facility] in no acute distress.  Pt alert and oriented to himself.  Tele applied and verified.  MD notified of pt arrival via text page.  Report given to Joyce CopaJessica L. RN.

## 2014-04-26 NOTE — Progress Notes (Signed)
Pt arrived, transferred for concerns of acute cva. H and P from one hour ago reviewed. Neurology consulted. Stroke w/u under way. Currently hypertensive. Will allow permissive htn for now. Pt appears lethargic with eyes closed, but pt is very much alert and will follow commands on que.   Pt noted to have recent bloody urine. Presenting UA w/o blood. abd CT unremarkable for acute process. Will follow.

## 2014-04-26 NOTE — Progress Notes (Signed)
UR Completed Brandonn Capelli Graves-Bigelow, RN,BSN [phone]  

## 2014-04-26 NOTE — Progress Notes (Signed)
EEG completed; results pending.    

## 2014-04-26 NOTE — Consult Note (Signed)
Referring Physician: Rhona Leavens    Chief Complaint: Stroke  HPI:                                                                                                                                         Nicholas [name] is an 63 y.o. male who lives alone at home.  He went to sleep at baseline yesterday and woke up around 3 AM to go to the bathroom.  Patient fell and possibly lost consciousness.  Patient had earlier called his brother because he had some hematuria. By the time EMS reached patient was found to be on the floor.  On way to ED he became lethargic and has remained lethargic since. Per EMS he has some left facial droop and left sided weakness. Currently he has no further facial droop.  Initial CT head showed a left hypodensity and follow up MRI confirms this as a stroke in the left ACA territory. Currently he is very  Drowsy able to follow commands.  He is repeating "I had apples" and then perseverating on last words stated.    Date last known well: Date: 04/25/2014 Time last known well: Unable to determine tPA Given: No: out of window NIHSS-2  Past Medical History  Diagnosis Date  . Hypertension   . Hyperlipidemia   . Type 2 diabetes mellitus   . History of myocardial infarction     1993--  NON-Q WAVE  S/P PCI TO LAD  . History of CVA (cerebrovascular accident)     1993---  NO RESIDUALS  . Hydrocele, left   . BPH (benign prostatic hypertrophy)     HX RETENTION  . PSVT (paroxysmal supraventricular tachycardia)   . Coronary artery disease CARDIOLOGIST--  DR Johney Frame    S/P  PCI TO LAD 1993  . Carotid artery occlusion     S/P RIGHT CEA  . PVD (peripheral vascular disease)     S/P LEFT FEM-POP  . Left carotid artery stenosis     MILD  . History of head injury     CONCUSSION--  NO RESIDUAL  . Right leg weakness     USES CANE--  SECONDARY TO PVD  . At high risk for falls   . Wears glasses   . Myocardial infarction   . GERD (gastroesophageal reflux disease)     Past Surgical  History  Procedure Laterality Date  . Carotid endarterectomy Right 05-31-2003  . Femoral-popliteal bypass graft Left 01-19-2003  . Right hydrocelectomy  05-31-2003  . Coronary angioplasty  1993    PCI TO LAD  . Cardiac catheterization  01-18-2003   DR NISHAN    NON-OBSTRUCTIVE CAD/  PREVIOIS ANGIOPLASTY SITE WIDELY PATENT  . Cardiovascular stress test  2007    SMALL ANTERO-APICAL SCAR/  NO ISCHEMIA  . Transthoracic echocardiogram  08-24-2010    EF 55%/  MILD AORTIC INSUFFICENCY/  MODERATE LVH  . Hydrocele  excision Left 10/27/2013    Procedure: HYDROCELECTOMY ADULT;  Surgeon: Lindaann SloughMarc-Henry Nesi, MD;  [facility];  Service: Urology;  Laterality: Left;    Family History  Problem Relation Age of Onset  . Stroke Mother   . Hypertension Mother   . Hypertension Father    Social History:  reports that he quit smoking about 20 years ago. His smoking use included Cigarettes. He smoked 0.00 packs per day for 0 years. He has never used smokeless tobacco. He reports that he does not drink alcohol or use illicit drugs.  Allergies: No Known Allergies  Medications:                                                                                                                           Prior to Admission:  Prescriptions prior to admission  Medication Sig Dispense Refill  . aspirin 325 MG EC tablet Take 325 mg by mouth daily.      Marland Kitchen. diltiazem (TAZTIA XT) 360 MG 24 hr capsule Take 360 mg by mouth daily.      Marland Kitchen. docusate sodium (COLACE) 100 MG capsule Take 100 mg by mouth 2 (two) times daily.      Marland Kitchen. doxazosin (CARDURA) 2 MG tablet Take 2 mg by mouth daily.      Marland Kitchen. glipiZIDE (GLUCOTROL XL) 5 MG 24 hr tablet Take 5 mg by mouth daily.      . metFORMIN (GLUCOPHAGE) 1000 MG tablet Take 1,000 mg by mouth 2 (two) times daily with a meal.      . metoprolol (LOPRESSOR) 50 MG tablet Take 1 tablet (50 mg total) by mouth 2 (two) times daily.  60 tablet  3  . rosuvastatin (CRESTOR) 10 MG  tablet Take 10 mg by mouth daily.       Scheduled: . aspirin  300 mg Rectal Daily   Or  . aspirin  325 mg Oral Daily  . atorvastatin  20 mg Oral q1800  . bisacodyl  10 mg Rectal Once  . diltiazem  360 mg Oral Daily  . docusate sodium  100 mg Oral BID  . doxazosin  2 mg Oral Daily  . enoxaparin (LOVENOX) injection  40 mg Subcutaneous Q24H  . glipiZIDE  5 mg Oral Daily  . insulin aspart  0-9 Units Subcutaneous TID WC  . metoprolol  50 mg Oral BID    ROS:  History obtained from patients daughter  General ROS: negative for - chills, fatigue, fever, night sweats, weight gain or weight loss Psychological ROS: negative for - behavioral disorder, hallucinations, memory difficulties, mood swings or suicidal ideation Ophthalmic ROS: negative for - blurry vision, double vision, eye pain or loss of vision ENT ROS: negative for - epistaxis, nasal discharge, oral lesions, sore throat, tinnitus or vertigo Allergy and Immunology ROS: negative for - hives or itchy/watery eyes Hematological and Lymphatic ROS: negative for - bleeding problems, bruising or swollen lymph nodes Endocrine ROS: negative for - galactorrhea, hair pattern changes, polydipsia/polyuria or temperature intolerance Respiratory ROS: negative for - cough, hemoptysis, shortness of breath or wheezing Cardiovascular ROS: negative for - chest pain, dyspnea on exertion, edema or irregular heartbeat Gastrointestinal ROS: negative for - abdominal pain, diarrhea, hematemesis, nausea/vomiting or stool incontinence Genito-Urinary ROS: negative for - dysuria, hematuria, incontinence or urinary frequency/urgency Musculoskeletal ROS: negative for - joint swelling or muscular weakness Neurological ROS: as noted in HPI Dermatological ROS: negative for rash and skin lesion changes  Neurologic Examination:                                                                                                       Blood pressure 165/68, pulse 146, temperature 97.9 F (36.6 C), temperature source Oral, resp. rate 21, SpO2 90.00%.   Mental Status: Very drowsy, able to follow simple commands such as showing me his thumbs and lifting his arms but unable to do complex tasks such as taking his left thumb and touching his right ear. Speech is clear but will perseverating on last thing asked to do or said.  Cranial Nerves: II: Discs flat bilaterally; blinks to threat bialterally, pupils equal, round, reactive to light and accommodation III,IV, VI: ptosis not present, extra-ocular motions intact bilaterally V,VII: face symmetric, facial light touch sensation normal bilaterally VIII: hearing normal bilaterally IX,X: gag reflex present XI: bilateral shoulder shrug XII: midline tongue extension without atrophy or fasciculations  Motor: Right : Upper extremity   5/5    Left:     Upper extremity   4/5  Lower extremity   4/5     Lower extremity   5/5 Tone and bulk:normal tone throughout; no atrophy noted Sensory: Pinprick and light touch intact throughout, bilaterally Deep Tendon Reflexes:  Right: Upper Extremity   Left: Upper extremity   biceps (C-5 to C-6) 2/4   biceps (C-5 to C-6) 2/4 tricep (C7) 2/4    triceps (C7) 2/4 Brachioradialis (C6) 2/4  Brachioradialis (C6) 2/4  Lower Extremity Lower Extremity  quadriceps (L-2 to L-4) 2/4   quadriceps (L-2 to L-4) 2/4 Achilles (S1) 0/4   Achilles (S1) 0/4  Plantars: Mute bilaterally Cerebellar: normal finger-to-nose,  normal heel-to-shin test Gait: not tested--having EEG CV: pulses palpable throughout    Lab Results: Basic Metabolic Panel:  Recent Labs Lab 04/26/14 0350 04/26/14 0935  NA 140 140  K 3.8 3.9  CL 102 102  CO2 25 24  GLUCOSE 108* 93  BUN 8 7  CREATININE 0.92 0.97  CALCIUM 9.4 9.2  Liver Function Tests:  Recent Labs Lab 04/26/14 0350  04/26/14 0935  AST 28 30  ALT 28 28  ALKPHOS 77 80  BILITOT 0.7 0.8  PROT 7.3 6.9  ALBUMIN 3.9 3.8   No results found for this basename: LIPASE, AMYLASE,  in the last 168 hours No results found for this basename: AMMONIA,  in the last 168 hours  CBC:  Recent Labs Lab 04/26/14 0350 04/26/14 0935  WBC 7.5 7.9  NEUTROABS  --  5.0  HGB 13.8 14.1  HCT 39.9 41.5  MCV 95.2 97.6  PLT 361 329    Cardiac Enzymes:  Recent Labs Lab 04/26/14 0935  CKTOTAL 487*  TROPONINI <0.30    Lipid Panel: No results found for this basename: CHOL, TRIG, HDL, CHOLHDL, VLDL, LDLCALC,  in the last 168 hours  CBG:  Recent Labs Lab 04/26/14 0652 04/26/14 0805 04/26/14 1227  GLUCAP 92 99 168*    Microbiology: Results for orders placed during the hospital encounter of 07/02/10  CULTURE, BLOOD (ROUTINE X 2)     Status: None   Collection Time    07/01/10 11:30 PM      Result Value Ref Range Status   Specimen Description BLOOD RIGHT HAND   Final   Special Requests BOTTLES DRAWN AEROBIC ONLY 10CC   Final   Culture NO GROWTH 5 DAYS   Final   Report Status 07/08/2010 FINAL   Final  CULTURE, BLOOD (ROUTINE X 2)     Status: None   Collection Time    07/01/10 11:45 PM      Result Value Ref Range Status   Specimen Description BLOOD LEFT ARM   Final   Special Requests BOTTLES DRAWN AEROBIC AND ANAEROBIC 10CC EACH   Final   Culture NO GROWTH 5 DAYS   Final   Report Status 07/08/2010 FINAL   Final  GRAM STAIN     Status: None   Collection Time    07/02/10  1:58 AM      Result Value Ref Range Status   Specimen Description ABSCESS RIGHT NECK   Final   Special Requests NONE   Final   Gram Stain     Final   Value: MODERATE WBC PRESENT, PREDOMINANTLY PMN     MODERATE GRAM POSITIVE COCCI IN CLUSTERS     CALLED ED GANN,A RN 07/02/10 0312 ACAINJ   Report Status 07/02/2010 FINAL   Final  CULTURE, ROUTINE-ABSCESS     Status: None   Collection Time    07/02/10  1:58 AM      Result Value Ref  Range Status   Specimen Description ABSCESS RIGHT NECK   Final   Special Requests NONE   Final   Gram Stain     Final   Value: MODERATE WBC PRESENT, PREDOMINANTLY PMN     MODERATE GRAM POSITIVE COCCI IN CLUSTERS     Gram Stain Report Called to,Read Back By and Verified With: Gram Stain Report Called to,Read Back By and Verified With: ED GANN RN 07/02/10 0312 ACAINJ [facility]   Culture     Final   Value: MULTIPLE ORGANISMS PRESENT, NONE PREDOMINANT     Note: NO GROUP A STREP (S.PYOGENES) ISOLATED NO STAPHYLOCOCCUS AUREUS ISOLATED   Report Status 07/05/2010 FINAL   Final    Coagulation Studies: No results found for this basename: LABPROT, INR,  in the last 72 hours  Imaging: Ct Abdomen Pelvis Wo Contrast  04/26/2014   CLINICAL DATA:  Abdominal  pain.  Evaluate for hydronephrosis.  EXAM: CT ABDOMEN AND PELVIS WITHOUT CONTRAST  TECHNIQUE: Multidetector CT imaging of the abdomen and pelvis was performed following the standard protocol without IV contrast.  COMPARISON:  No priors.  FINDINGS: Lung Bases: Calcifications of the aortic valve. Atherosclerotic calcifications in the right coronary artery.  Abdomen/Pelvis: No abnormal calcifications are identified within the collecting system of either kidney, along the course of either ureter, or within the lumen of the urinary bladder. No hydroureteronephrosis to suggest urinary tract obstruction at this time. The unenhanced appearance of the kidneys is unremarkable bilaterally.  The unenhanced appearance of the liver, gallbladder, pancreas, spleen and bilateral adrenal glands is unremarkable. Atherosclerosis throughout the abdominal and pelvic vasculature, without evidence of aneurysm. Small umbilical hernia containing only omental fat incidentally noted. No evidence of bowel incarceration or obstruction at this time. No significant volume of ascites. No pneumoperitoneum. Numerous colonic diverticulae are noted, most pronounced in the  descending and sigmoid colon, without surrounding inflammatory changes to suggest an acute diverticulitis at this time. Normal appendix. Prostate gland and urinary bladder are unremarkable in appearance.  Musculoskeletal: There are no aggressive appearing lytic or blastic lesions noted in the visualized portions of the skeleton.  IMPRESSION: 1. No acute findings in the abdomen or pelvis. 2. Specifically, no abnormal urinary tract calculi or findings of urinary tract obstruction at this time. 3. Colonic diverticulosis without findings to suggest acute diverticulitis at this time. 4. Normal appendix. 5. Small umbilical hernia containing a small amount of omental fat. No associated bowel incarceration or obstruction at this time. 6. Atherosclerosis, including right coronary artery disease. Please note that although the presence of coronary artery calcium documents the presence of coronary artery disease, the severity of this disease and any potential stenosis cannot be assessed on this non-gated CT examination. Assessment for potential risk factor modification, dietary therapy or pharmacologic therapy may be warranted, if clinically indicated. 7. There are calcifications of the aortic valve. Echocardiographic correlation for evaluation of potential valvular dysfunction may be warranted if clinically indicated.   Electronically Signed   By: Trudie Reed M.D.   On: 04/26/2014 06:37   Dg Chest 2 View  04/26/2014   CLINICAL DATA:  Stroke.  EXAM: CHEST  2 VIEW  COMPARISON:  No prior.  FINDINGS: Bilateral pulmonary infiltrates are present. Differential diagnosis includes pulmonary edema and/or pneumonia. Borderline cardiomegaly with mild pulmonary vascular prominence. No pleural effusion or pneumothorax.  IMPRESSION: Bilateral pulmonary infiltrates. Differential diagnosis includes pulmonary edema and/or node bilateral pneumonia.   Electronically Signed   By: Maisie Fus  Register   On: 04/26/2014 12:13   Ct Head Wo  Contrast  04/26/2014   CLINICAL DATA:  Weakness.  EXAM: CT HEAD WITHOUT CONTRAST  TECHNIQUE: Contiguous axial images were obtained from the base of the skull through the vertex without intravenous contrast.  COMPARISON:  CT NECK W/CM dated 07/03/2010; CT HEAD W/O CM dated 04/02/2009  FINDINGS: The ventricles and sulci are normal for age. No intraparenchymal hemorrhage, mass effect nor midline shift. Patchy supratentorial white matter hypodensities are within normal range for patient's age and though non-specific suggest sequelae of chronic small vessel ischemic disease. Focal loss of the left medial frontal/ cingulate gray-white matter differentiation (axial 20/31). Right frontoparietal cystic encephalomalacia with mild ex vacuo dilatation of the subjacent ventricle.  No abnormal extra-axial fluid collections. Basal cisterns are patent. Moderate calcific atherosclerosis of the carotid siphons.  No skull fracture. The included ocular globes and orbital contents are non-suspicious. Mild paranasal  sinus mucosal thickening without air-fluid levels.  IMPRESSION: Focal loss of the left medial frontal gray white matter differentiation concerning for acute to subacute ischemia, left anterior cerebral artery territory. MRI of the brain with diffusion-weighted sequences would be more sensitive.  Remote right frontoparietal infarcts.  Findings discussed with and reconfirmed by Dr.COURTNEY, HORTON on5/18/2015at4:39 AM.   Electronically Signed   By: Awilda Metro   On: 04/26/2014 04:40   Mr Brain Wo Contrast  04/26/2014   CLINICAL DATA:  Loss of consciousness. Stroke. Chronic left lower extremity weakness. Left upper extremity weakness and and  EXAM: MRI HEAD WITHOUT CONTRAST  MRA HEAD WITHOUT CONTRAST  TECHNIQUE: Multiplanar, multiecho pulse sequences of the brain and surrounding structures were obtained without intravenous contrast. Angiographic images of the head were obtained using MRA technique without contrast.   COMPARISON:  CT head from the same day.  FINDINGS: MRI HEAD FINDINGS  The diffusion-weighted images demonstrate an acute nonhemorrhagic infarct within the left ACA territory. No hemorrhage or mass lesion is present.  A remote right parietal lobe infarct is evident. Mild generalized atrophy is noted. There is ex vacuo dilation of the right lateral ventricle compatible with the prior infarct.  Right ICA and MCA conclusion is noted.  MRA HEAD FINDINGS  The MRA study is moderately degraded by patient motion. The right internal carotid artery is occluded. The left internal carotid artery is unremarkable. There is mild irregularity in the left A1 and M1 segments, likely exaggerated by patient motion. There is some flow in the right A1 segment. Terminal right ICA is reconstituted at the level of the right posterior communicating artery although no dominant artery is seen.  The right vertebral artery is dominant to the left. The basilar artery is within normal limits. There is moderate signal loss at the origin of the left posterior cerebral artery. Both posterior cerebral arteries originate from the basilar tip.  IMPRESSION: 1. Acute nonhemorrhagic left ACA territory infarct. 2. Remote right parietal lobe infarct. 3. 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 These results will be called to the ordering clinician or representative by the Radiologist Assistant, and communication documented in the PACS or zVision Dashboard.   Electronically Signed   By: Gennette Pac M.D.   On: 04/26/2014 11:46   Mr Maxine Glenn Head/brain Wo Cm  04/26/2014   CLINICAL DATA:  Loss of consciousness. Stroke. Chronic left lower extremity weakness. Left upper extremity weakness and and  EXAM: MRI HEAD WITHOUT CONTRAST  MRA HEAD WITHOUT CONTRAST  TECHNIQUE: Multiplanar, multiecho pulse sequences of the brain and surrounding structures were obtained without intravenous contrast.  Angiographic images of the head were obtained using MRA technique without contrast.  COMPARISON:  CT head from the same day.  FINDINGS: MRI HEAD FINDINGS  The diffusion-weighted images demonstrate an acute nonhemorrhagic infarct within the left ACA territory. No hemorrhage or mass lesion is present.  A remote right parietal lobe infarct is evident. Mild generalized atrophy is noted. There is ex vacuo dilation of the right lateral ventricle compatible with the prior infarct.  Right ICA and MCA conclusion is noted.  MRA HEAD FINDINGS  The MRA study is moderately degraded by patient motion. The right internal carotid artery is occluded. The left internal carotid artery is unremarkable. There is mild irregularity in the left A1 and M1 segments, likely exaggerated by patient motion. There is some flow in the right A1 segment. Terminal right ICA is reconstituted at  the level of the right posterior communicating artery although no dominant artery is seen.  The right vertebral artery is dominant to the left. The basilar artery is within normal limits. There is moderate signal loss at the origin of the left posterior cerebral artery. Both posterior cerebral arteries originate from the basilar tip.  IMPRESSION: 1. Acute nonhemorrhagic left ACA territory infarct. 2. Remote right parietal lobe infarct. 3. 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 These results will be called to the ordering clinician or representative by the Radiologist Assistant, and communication documented in the PACS or zVision Dashboard.   Electronically Signed   By: Gennette Pac M.D.   On: 04/26/2014 11:46       Assessment and plan discussed with with attending physician and they are in agreement.    Felicie Morn PA-C [facility] [phone]  04/26/2014, 2:14 PM  I have seen and evaluated the patient. I have reviewed the above note and made appropriate  changes.    Assessment: 63 y.o. male with acute left ACA infarct. On exam patient shows right leg weakness and at time perseverating on thoughts and words. There was some question of seizure, but with the distribution fitting a vascular territory, I feel that stroke is much more likely.    Stroke Risk Factors - diabetes mellitus, hyperlipidemia, hypertension and CAD and PVD   Recommend 1. HgbA1c, fasting lipid panel 2. MRI, MRA  of the brain without contrast 3. PT consult, OT consult, Speech consult 4. Echocardiogram 5. Carotid dopplers 6. Prophylactic therapy-Antiplatelet med: Plavix - dose 75 mg daily 7. Risk  Factor modification 8. Will f/u EEG  Ritta Slot, MD [facility] [phone]  If 7pm- 7am, please page neurology on call as listed in AMION.

## 2014-04-26 NOTE — Evaluation (Signed)
Clinical/Bedside Swallow Evaluation Patient Details  Name: Nicholas [name] MRN: [id number] Date of Birth: 07/01/1951  Today's Date: 04/26/2014 Time: [id number] SLP Time Calculation (min): 21 min  Past Medical History:  Past Medical History  Diagnosis Date  . Hypertension   . Hyperlipidemia   . Type 2 diabetes mellitus   . History of myocardial infarction     1993--  NON-Q WAVE  S/P PCI TO LAD  . History of CVA (cerebrovascular accident)     1993---  NO RESIDUALS  . Hydrocele, left   . BPH (benign prostatic hypertrophy)     HX RETENTION  . PSVT (paroxysmal supraventricular tachycardia)   . Coronary artery disease CARDIOLOGIST--  DR Johney FrameALLRED    S/P  PCI TO LAD 1993  . Carotid artery occlusion     S/P RIGHT CEA  . PVD (peripheral vascular disease)     S/P LEFT FEM-POP  . Left carotid artery stenosis     MILD  . History of head injury     CONCUSSION--  NO RESIDUAL  . Right leg weakness     USES CANE--  SECONDARY TO PVD  . At high risk for falls   . Wears glasses   . Myocardial infarction   . GERD (gastroesophageal reflux disease)    Past Surgical History:  Past Surgical History  Procedure Laterality Date  . Carotid endarterectomy Right 05-31-2003  . Femoral-popliteal bypass graft Left 01-19-2003  . Right hydrocelectomy  05-31-2003  . Coronary angioplasty  1993    PCI TO LAD  . Cardiac catheterization  01-18-2003   DR NISHAN    NON-OBSTRUCTIVE CAD/  PREVIOIS ANGIOPLASTY SITE WIDELY PATENT  . Cardiovascular stress test  2007    SMALL ANTERO-APICAL SCAR/  NO ISCHEMIA  . Transthoracic echocardiogram  08-24-2010    EF 55%/  MILD AORTIC INSUFFICENCY/  MODERATE LVH  . Hydrocele excision Left 10/27/2013    Procedure: HYDROCELECTOMY ADULT;  Surgeon: Lindaann SloughMarc-Henry Nesi, MD;  [facility] [place];  Service: Urology;  Laterality: Left;   HPI:  63 year old male admitted with acute encephalopathy with possible origins including stroke vs postictal state (MRI pending  and  abdominal pain. PMH of CEA, GERD, CVA, head injury.    Assessment / Plan / Recommendation Clinical Impression  Pt presents with impulsivity and perseveration, which requires Max-Total A for small, single bites/sips. Large consecutive cup sips of water resulted in suspected premature spillage and likely aspiration, with strong reflexive cough response noted. With assistance provided for a more controlled rate, no further overt s/s of aspiration were observed. SLP provided thorough education to RN and family members regarding current recommendations, with emphasis on the need for full supervision. SLP to follow for diet tolerance and more in-depth assessment of cognitive-linguistic abilities.    Aspiration Risk  Moderate    Diet Recommendation Dysphagia 3 (Mechanical Soft);Thin liquid   Liquid Administration via: Cup;Straw Medication Administration: Whole meds with puree Supervision: Patient able to self feed;Full supervision/cueing for compensatory strategies;Staff to assist with self feeding (assistance needed due to impulsivity) Compensations: Slow rate;Small sips/bites Postural Changes and/or Swallow Maneuvers: Seated upright 90 degrees;Upright 30-60 min after meal    Other  Recommendations Oral Care Recommendations: Oral care BID   Follow Up Recommendations  Inpatient Rehab    Frequency and Duration min 2x/week  2 weeks   Pertinent Vitals/Pain N/A    SLP Swallow Goals     Swallow Study Prior Functional Status       General  Date of Onset: 04/26/14 HPI: 63 year old male admitted with acute encephalopathy with possible origins including stroke vs postictal state (MRI pending and  abdominal pain. PMH of CEA, GERD, CVA, head injury.  Type of Study: Bedside swallow evaluation Previous Swallow Assessment: none Diet Prior to this Study: NPO Temperature Spikes Noted: No Respiratory Status: Room air History of Recent Intubation: No Behavior/Cognition:  Alert;Cooperative;Requires cueing Oral Cavity - Dentition: Adequate natural dentition;Missing dentition Self-Feeding Abilities: Able to feed self;Needs assist Patient Positioning: Upright in bed Baseline Vocal Quality: Clear Volitional Cough: Weak (cognitively unable to elicit stronger cough, although reflexive cough was strong) Volitional Swallow: Able to elicit    Oral/Motor/Sensory Function Overall Oral Motor/Sensory Function: Appears within functional limits for tasks assessed   Ice Chips Ice chips: Not tested   Thin Liquid Thin Liquid: Impaired Presentation: Cup;Self Fed;Spoon;Straw Pharyngeal  Phase Impairments: Suspected delayed Swallow;Cough - Immediate    Nectar Thick Nectar Thick Liquid: Not tested   Honey Thick Honey Thick Liquid: Not tested   Puree Puree: Within functional limits Presentation: Self Fed;Spoon   Solid   GO    Solid: Within functional limits Presentation: Self Fed        Maxcine HamLaura Paiewonsky, M.A. CCC-SLP [phone]  Maxcine HamLaura Paiewonsky 04/26/2014,4:54 PM

## 2014-04-26 NOTE — Procedures (Signed)
History: 63 year old male with a history of right leg weakness that started around 2 AM  Sedation: None  Technique: This is a 17 channel routine scalp EEG performed at the bedside with bipolar and monopolar montages arranged in accordance to the international 10/20 system of electrode placement. One channel was dedicated to EKG recording.    Background: There is a well defined posterior dominant rhythm of 9 Hz that attenuates with eye opening. The background consists predominately of alpha and beta activities. There is drowsiness recorded in sleep structures are briefly seen.  Photic stimulation: Physiologic driving is not performed  EEG Abnormalities: None  Clinical Interpretation: This normal EEG is recorded in the waking and sleep state. There was no seizure or seizure predisposition recorded on this study.   Ritta SlotMcNeill Kaliana Albino, MD [facility] [phone]  If 7pm- 7am, please page neurology on call as listed in AMION.

## 2014-04-26 NOTE — ED Notes (Signed)
Pt is too lethargic to safely participate in stroke swallow evaluation. Dr. Toniann FailKakrakandy aware.

## 2014-04-26 NOTE — Progress Notes (Signed)
Patient C/O severe abdominal pain. Text paged Dr Rhona Leavenshiu. Also informed Dr Rhona Leavenshiu that MRI positive for infarct

## 2014-04-26 NOTE — H&P (Addendum)
[facility] History and Physical  Jair Lindblad Grealish XWR:[id number] DOB: January 09, 1951 DOA: 04/26/2014  Referring physician: ER physician. PCP: [name], MD   Chief Complaint: Loss of consciousness.  HPI: Nicholas [name] is a 63 y.o. male with history of CVA, hypertension, hyperlipidemia, CAD was brought to the ER after patient was found on the floor and possibly lost his consciousness. Patient had earlier called his brother because he had some hematuria. By the time EMS reached patient was found to be on the floor and patient feels he may have lost his consciousness. On arrival the ER patient was found to be lethargic and drowsy. UA and urine drug scan pending. CT head shows possible acute to subacute stroke. Patient has chronic left lower extremity weakness from previous injury as per the daughter. On-call neurologist Dr. Roseanne Reno was consulted by the ED physician at this time Dr. Roseanne Reno has requested transfer patient to [facility] for further stroke workup. Patient states that his abdomen is still mildly painful mostly in the right flank area. Patient is to lethargic but follows commands. He is oriented to his name and place. Moves all extremities. Denies any chest pain or shortness of breath.   Review of Systems: As presented in the history of presenting illness, rest negative.  Past Medical History  Diagnosis Date  . Hypertension   . Hyperlipidemia   . Type 2 diabetes mellitus   . History of myocardial infarction     1993--  NON-Q WAVE  S/P PCI TO LAD  . History of CVA (cerebrovascular accident)     1993---  NO RESIDUALS  . Hydrocele, left   . BPH (benign prostatic hypertrophy)     HX RETENTION  . PSVT (paroxysmal supraventricular tachycardia)   . Coronary artery disease CARDIOLOGIST--  DR Johney Frame    S/P  PCI TO LAD 1993  . Carotid artery occlusion     S/P RIGHT CEA  . PVD (peripheral vascular disease)     S/P LEFT FEM-POP  . Left carotid artery stenosis      MILD  . History of head injury     CONCUSSION--  NO RESIDUAL  . Right leg weakness     USES CANE--  SECONDARY TO PVD  . At high risk for falls   . Wears glasses    Past Surgical History  Procedure Laterality Date  . Carotid endarterectomy Right 05-31-2003  . Femoral-popliteal bypass graft Left 01-19-2003  . Right hydrocelectomy  05-31-2003  . Coronary angioplasty  1993    PCI TO LAD  . Cardiac catheterization  01-18-2003   DR NISHAN    NON-OBSTRUCTIVE CAD/  PREVIOIS ANGIOPLASTY SITE WIDELY PATENT  . Cardiovascular stress test  2007    SMALL ANTERO-APICAL SCAR/  NO ISCHEMIA  . Transthoracic echocardiogram  08-24-2010    EF 55%/  MILD AORTIC INSUFFICENCY/  MODERATE LVH  . Hydrocele excision Left 10/27/2013    Procedure: HYDROCELECTOMY ADULT;  Surgeon: Lindaann Slough, MD;  [facility];  Service: Urology;  Laterality: Left;   Social History:  reports that he quit smoking about 20 years ago. His smoking use included Cigarettes. He smoked 0.00 packs per day for 0 years. He has never used smokeless tobacco. He reports that he does not drink alcohol or use illicit drugs. Where does patient live home. Can patient participate in ADLs? Yes.  No Known Allergies  Family History:  Family History  Problem Relation Age of Onset  . Stroke Mother   .  Hypertension Mother   . Hypertension Father       Prior to Admission medications   Medication Sig Start Date End Date Taking? Authorizing Provider  aspirin 325 MG EC tablet Take 325 mg by mouth daily.   Yes Historical Provider, MD  diltiazem (TAZTIA XT) 360 MG 24 hr capsule Take 360 mg by mouth daily.   Yes Historical Provider, MD  docusate sodium (COLACE) 100 MG capsule Take 100 mg by mouth 2 (two) times daily.   Yes Historical Provider, MD  doxazosin (CARDURA) 2 MG tablet Take 2 mg by mouth daily.   Yes Historical Provider, MD  glipiZIDE (GLUCOTROL XL) 5 MG 24 hr tablet Take 5 mg by mouth daily.   Yes Historical  Provider, MD  metFORMIN (GLUCOPHAGE) 1000 MG tablet Take 1,000 mg by mouth 2 (two) times daily with a meal.   Yes Historical Provider, MD  metoprolol (LOPRESSOR) 50 MG tablet Take 1 tablet (50 mg total) by mouth 2 (two) times daily. 02/12/12  Yes Luis AbedJeffrey D Katz, MD  rosuvastatin (CRESTOR) 10 MG tablet Take 10 mg by mouth daily.   Yes Historical Provider, MD    Physical Exam: Filed Vitals:   04/26/14 0323 04/26/14 0332 04/26/14 0407  BP: 160/80 163/58   Pulse: 80 82   Temp:  97.8 F (36.6 C) 98.2 F (36.8 C)  TempSrc:  Oral Rectal  Resp:  24   SpO2:  95%      General:  Well-developed and nourished.  Eyes: Anicteric no pallor.  ENT: No discharge from the ears eyes nose mouth.  Neck: No mass felt.  Cardiovascular: S1-S2 heard.  Respiratory: No rhonchi or crepitations.  Abdomen: Soft nontender bowel sounds present. No guarding or rigidity.  Skin: No rash. Scar on the right side of the neck from previous surgery.  Musculoskeletal: No edema.  Psychiatric: Patient is lethargic.  Neurologic: Patient is lethargic but oriented to his name and place. Moves all extremities. Mild weakness in the left lower extremity which patient started states is chronic. PERRLA positive. Tongue is midline. No facial asymmetry.  Labs on Admission:  Basic Metabolic Panel:  Recent Labs Lab 04/26/14 0350  NA 140  K 3.8  CL 102  CO2 25  GLUCOSE 108*  BUN 8  CREATININE 0.92  CALCIUM 9.4   Liver Function Tests:  Recent Labs Lab 04/26/14 0350  AST 28  ALT 28  ALKPHOS 77  BILITOT 0.7  PROT 7.3  ALBUMIN 3.9   No results found for this basename: LIPASE, AMYLASE,  in the last 168 hours No results found for this basename: AMMONIA,  in the last 168 hours CBC:  Recent Labs Lab 04/26/14 0350  WBC 7.5  HGB 13.8  HCT 39.9  MCV 95.2  PLT 361   Cardiac Enzymes: No results found for this basename: CKTOTAL, CKMB, CKMBINDEX, TROPONINI,  in the last 168 hours  BNP (last 3 results) No  results found for this basename: PROBNP,  in the last 8760 hours CBG: No results found for this basename: GLUCAP,  in the last 168 hours  Radiological Exams on Admission: Ct Head Wo Contrast  04/26/2014   CLINICAL DATA:  Weakness.  EXAM: CT HEAD WITHOUT CONTRAST  TECHNIQUE: Contiguous axial images were obtained from the base of the skull through the vertex without intravenous contrast.  COMPARISON:  CT NECK W/CM dated 07/03/2010; CT HEAD W/O CM dated 04/02/2009  FINDINGS: The ventricles and sulci are normal for age. No intraparenchymal hemorrhage, mass effect nor  midline shift. Patchy supratentorial white matter hypodensities are within normal range for patient's age and though non-specific suggest sequelae of chronic small vessel ischemic disease. Focal loss of the left medial frontal/ cingulate gray-white matter differentiation (axial 20/31). Right frontoparietal cystic encephalomalacia with mild ex vacuo dilatation of the subjacent ventricle.  No abnormal extra-axial fluid collections. Basal cisterns are patent. Moderate calcific atherosclerosis of the carotid siphons.  No skull fracture. The included ocular globes and orbital contents are non-suspicious. Mild paranasal sinus mucosal thickening without air-fluid levels.  IMPRESSION: Focal loss of the left medial frontal gray white matter differentiation concerning for acute to subacute ischemia, left anterior cerebral artery territory. MRI of the brain with diffusion-weighted sequences would be more sensitive.  Remote right frontoparietal infarcts.  Findings discussed with and reconfirmed by Dr.COURTNEY, HORTON on5/18/2015at4:39 AM.   Electronically Signed   By: Awilda Metroourtnay  Bloomer   On: 04/26/2014 04:40    EKG: Independently reviewed. Normal sinus rhythm with nonspecific ST-T changes.  Assessment/Plan Principal Problem:   Acute encephalopathy Active Problems:   HYPERLIPIDEMIA-MIXED   HYPERTENSION, UNSPECIFIED   CAD, NATIVE VESSEL   Stroke    Abdominal pain   1. Acute encephalopathy - possibilities include stroke versus postictal state. Urine drug screen is pending. [facility]. [name] is agreeable to the plan. Dr. Jilda PandaJared will be the accepting physician. Patient will be placed on neurochecks and swallow evaluation. Check MRI/MRA brain carotid Doppler 2-D echo and hemoglobin A1c and lipid panel. 2. Abdominal pain - patient is complaining of blood in the urine and right sided abdominal pain. Check UA. CT abdomen pelvis without contrast is pending. 3. Diabetes mellitus type 2 - continue glipizide. Hold metformin for now. Sliding-scale coverage. 4. Hyperlipidemia - check CK levels if elevated then may have to hold statins. 5. Hypertension - if patient fails swallow then may need IV medications. 6. CAD - denies any chest pain.  Addendum - [facility] notified me that patient was noted to have mild increase in weakness in the left upper extremity on the way [facility]. I will notify on coming hospitalist and Neurologist.   Code Status: Full code.  Family Communication: [name].  Disposition Plan: Admit to inpatient.    Eduard ClosArshad N Keyonte Cookston [facility] Pager [phone].  If 7PM-7AM, please contact night-coverage [email or web address] Password [email or web address] 04/26/2014, 6:03 AM

## 2014-04-27 DIAGNOSIS — Z8679 Personal history of other diseases of the circulatory system: Secondary | ICD-10-CM

## 2014-04-27 DIAGNOSIS — I739 Peripheral vascular disease, unspecified: Secondary | ICD-10-CM

## 2014-04-27 DIAGNOSIS — I359 Nonrheumatic aortic valve disorder, unspecified: Secondary | ICD-10-CM

## 2014-04-27 DIAGNOSIS — F17200 Nicotine dependence, unspecified, uncomplicated: Secondary | ICD-10-CM

## 2014-04-27 LAB — LIPID PANEL
Cholesterol: 112 mg/dL (ref 0–200)
HDL: 38 mg/dL — ABNORMAL LOW (ref >39)
LDL Cholesterol: 53 mg/dL (ref 0–99)
Total CHOL/HDL Ratio: 2.9 RATIO
Triglycerides: 103 mg/dL (ref <150)
VLDL: 21 mg/dL (ref 0–40)

## 2014-04-27 LAB — HEMOGLOBIN A1C
Hgb A1c MFr Bld: 5.7 % — ABNORMAL HIGH (ref <5.7)
Mean Plasma Glucose: 117 mg/dL — ABNORMAL HIGH (ref <117)

## 2014-04-27 LAB — GLUCOSE, CAPILLARY
GLUCOSE-CAPILLARY: 87 mg/dL (ref 70–99)
GLUCOSE-CAPILLARY: 105 mg/dL — AB (ref 70–99)
Glucose-Capillary: 185 mg/dL — ABNORMAL HIGH (ref 70–99)
Glucose-Capillary: 58 mg/dL — ABNORMAL LOW (ref 70–99)
Glucose-Capillary: 108 mg/dL — ABNORMAL HIGH (ref 70–99)

## 2014-04-27 MED ORDER — ATORVASTATIN CALCIUM 10 MG PO TABS
10.0000 mg | ORAL_TABLET | Freq: Every day | ORAL | Status: DC
  Administered 2014-04-27: 10 mg via ORAL
  Filled 2014-04-27 (×2): qty 1

## 2014-04-27 NOTE — Evaluation (Signed)
Speech Language Pathology Evaluation Patient Details Name: Nicholas [name] MRN: [id number] DOB: 01/21/1951 Today's Date: 04/27/2014 Time: 4098-11911550-1617 SLP Time Calculation (min): 27 min  Problem List:  Patient Active Problem List   Diagnosis Date Noted  . Stroke 04/26/2014  . Acute encephalopathy 04/26/2014  . Abdominal pain 04/26/2014  . PSVT 09/15/2010  . AORTIC INSUFFICIENCY 08/09/2010  . CLAUDICATION 08/09/2010  . PALPITATIONS 08/09/2010  . PALPITATIONS, HX OF 08/09/2010  . HYPERLIPIDEMIA-MIXED 08/08/2010  . TOBACCO ABUSE 08/08/2010  . HYPERTENSION, UNSPECIFIED 08/08/2010  . CAD, NATIVE VESSEL 08/08/2010  . CAROTID ARTERY DISEASE 08/08/2010   Past Medical History:  Past Medical History  Diagnosis Date  . Hypertension   . Hyperlipidemia   . Type 2 diabetes mellitus   . History of myocardial infarction     1993--  NON-Q WAVE  S/P PCI TO LAD  . History of CVA (cerebrovascular accident)     1993---  NO RESIDUALS  . Hydrocele, left   . BPH (benign prostatic hypertrophy)     HX RETENTION  . PSVT (paroxysmal supraventricular tachycardia)   . Coronary artery disease CARDIOLOGIST--  DR Johney FrameALLRED    S/P  PCI TO LAD 1993  . Carotid artery occlusion     S/P RIGHT CEA  . PVD (peripheral vascular disease)     S/P LEFT FEM-POP  . Left carotid artery stenosis     MILD  . History of head injury     CONCUSSION--  NO RESIDUAL  . Right leg weakness     USES CANE--  SECONDARY TO PVD  . At high risk for falls   . Wears glasses   . Myocardial infarction   . GERD (gastroesophageal reflux disease)    Past Surgical History:  Past Surgical History  Procedure Laterality Date  . Carotid endarterectomy Right 05-31-2003  . Femoral-popliteal bypass graft Left 01-19-2003  . Right hydrocelectomy  05-31-2003  . Coronary angioplasty  1993    PCI TO LAD  . Cardiac catheterization  01-18-2003   DR NISHAN    NON-OBSTRUCTIVE CAD/  PREVIOIS ANGIOPLASTY SITE WIDELY PATENT  . Cardiovascular  stress test  2007    SMALL ANTERO-APICAL SCAR/  NO ISCHEMIA  . Transthoracic echocardiogram  08-24-2010    EF 55%/  MILD AORTIC INSUFFICENCY/  MODERATE LVH  . Hydrocele excision Left 10/27/2013    Procedure: HYDROCELECTOMY ADULT;  Surgeon: Lindaann SloughMarc-Henry Nesi, MD;  [facility];  Service: Urology;  Laterality: Left;   HPI:  63 year old male admitted with acute encephalopathy with possible origins including stroke vs postictal state (MRI pending and  abdominal pain. PMH of CEA, GERD, CVA, head injury.    Assessment / Plan / Recommendation Clinical Impression  Pt demonstrates primary cognitive deficits, including decreased initiation of verbal interaction, flat affect, decreased ability for funcitonal problem solving and limited awareness of deficits and their impact. Pt will need f/u with acute SLP for functional communicaiton and cognition, recommend CIR consult.     SLP Assessment       Follow Up Recommendations  Inpatient Rehab    Frequency and Duration     2 x week/2 weeks   Pertinent Vitals/Pain NA   SLP Goals     SLP Evaluation Prior Functioning  Cognitive/Linguistic Baseline: Baseline deficits Baseline deficit details: mild memory Type of Home: Apartment  Lives With: Alone Available Help at Discharge: Family;Available 24 hours/day   Cognition  Overall Cognitive Status: Impaired/Different from baseline Arousal/Alertness: Awake/alert Orientation Level: Oriented to person;Oriented  to situation;Disoriented to time;Oriented to place Attention: Focused;Sustained Focused Attention: Appears intact Sustained Attention: Impaired Sustained Attention Impairment: Verbal complex;Functional complex Memory: Impaired Memory Impairment: Storage deficit;Retrieval deficit Awareness: Impaired Awareness Impairment: Intellectual impairment;Emergent impairment;Anticipatory impairment Problem Solving: Impaired Problem Solving Impairment: Verbal basic;Functional  basic Executive Function: Reasoning Behaviors:  (flat affect) Safety/Judgment: Impaired    Comprehension  Auditory Comprehension Overall Auditory Comprehension: Impaired Yes/No Questions: Within Functional Limits Commands: Impaired One Step Basic Commands: 75-100% accurate Multistep Basic Commands: 50-74% accurate Complex Commands: 50-74% accurate Conversation: Simple Interfering Components: Attention;Processing speed EffectiveTechniques: Repetition Reading Comprehension Reading Status: Within funtional limits    Expression Verbal Expression Overall Verbal Expression: Impaired Initiation: No impairment Automatic Speech: Name Level of Generative/Spontaneous Verbalization: Phrase Repetition: No impairment Naming: No impairment Pragmatics: Impairment Impairments: Eye contact;Abnormal affect;Monotone Interfering Components: Attention Written Expression Dominant Hand: Right   Oral / Motor Oral Motor/Sensory Function Overall Oral Motor/Sensory Function: Appears within functional limits for tasks assessed Motor Speech Overall Motor Speech: Impaired Phonation: Low vocal intensity   GO    Nicholas DittyBonnie Johnnae Impastato, MA CCC-SLP [phone]  Riley NearingBonnie Caroline Oliverio Cho 04/27/2014, 4:45 PM

## 2014-04-27 NOTE — Evaluation (Signed)
Occupational Therapy Evaluation Patient Details Name: Nicholas FullingWillie J [name] MRN: [id number] DOB: 05/19/1951 Today's Date: 04/27/2014    History of Present Illness Pt admitted with RLE weakness and fall with L ACA CVA.  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 LE weakness due to PVD   Clinical Impression   Pt admitted with above. He demonstrates the below listed deficits and will benefit from continued OT to maximize safety and independence with BADLs.  Pt presents to OT with significant balance deficits, probable visual deficits and impaired cognition.  Pt required max - total A with all aspects of ADLs with OT.  Pt did not give maximal effort with OT. RN reports pt's demeanor and affect changed significantly after PT so unsure if pt somewhat depressive, and/or if he possibly was self conscious with daughter and granddaughter present.   Feel he has more capability than what he showed with OT, and feel he would be a good rehab candidate.      Follow Up Recommendations  CIR    Equipment Recommendations  3 in 1 bedside comode;Tub/shower bench    Recommendations for Other Services       Precautions / Restrictions Precautions Precautions: Fall      Mobility Bed Mobility Overal bed mobility: Needs Assistance Bed Mobility: Supine to Sit;Sit to Supine     Supine to sit: Total assist Sit to supine: Max assist   General bed mobility comments: Pt required assist with all aspects of moving sit to supine and supine to sit.   Transfers Overall transfer level: Needs assistance   Transfers: Sit to/from Stand Sit to Stand: Total assist (unable to achieve with +1 assist)              Balance Overall balance assessment: Needs assistance Sitting-balance support: Bilateral upper extremity supported;Feet supported Sitting balance-Leahy Scale: Zero Sitting balance - Comments: Pt falling to Rt. with little to no initiation to correct.  Requires max A to maintain EOB sitting   Postural control: Right lateral lean                                  ADL Overall ADL's : Needs assistance/impaired Eating/Feeding: Moderate assistance;Bed level   Grooming: Wash/dry hands;Wash/dry face;Moderate assistance;Bed level   Upper Body Bathing: Moderate assistance;Bed level   Lower Body Bathing: Total assistance;Bed level   Upper Body Dressing : Total assistance;Bed level   Lower Body Dressing: Total assistance;Bed level   Toilet Transfer: Total assistance (unable)   Toileting- Clothing Manipulation and Hygiene: Total assistance;Bed level       Functional mobility during ADLs: Maximal assistance (to move and to sit EOB) General ADL Comments: Pt required max A to sit EOB. Pt with flat affect and only minimally particiaptory.  Pt's daughter and granddaughter present - unsure if pt self conscious and not engaging unless max prompts provided.  RN reports pt's affect changed significantly after PT - questionable depression after realizing extent of deficits.      Vision                 Additional Comments: Pt reported blurry vision and diplopia with PT; however, he denies these with OT.  Pt either unable to particiapte in visual pursuits or would not participate, even with max cues.  Pt with moderate difficulty reading clock on wall.  Initially, he stated he couldn't see it.  When prompted, he was able  to correctly describe location of each hand, and then the time.   **Pt is illiterate**   Perception     Praxis      Pertinent Vitals/Pain Denies pain      Hand Dominance Right   Extremity/Trunk Assessment Upper Extremity Assessment Upper Extremity Assessment: Generalized weakness   Lower Extremity Assessment Lower Extremity Assessment: Defer to PT evaluation   Cervical / Trunk Assessment Cervical / Trunk Assessment: Other exceptions Cervical / Trunk Exceptions: Pt maintains posterior pelvic tilt with passive Rt. lateral flexion.  Pt falls to  Rt. and does not initiate a balance reaction.  He can prop himself with Rt. UE to assist with sitting    Communication Communication Communication: Expressive difficulties (speech delayed)   Cognition Arousal/Alertness: Awake/alert Behavior During Therapy: Flat affect Overall Cognitive Status: Impaired/Different from baseline Area of Impairment: Attention;Memory;Following commands;Safety/judgement;Awareness;Problem solving Orientation Level: Time Current Attention Level: Sustained Memory: Decreased short-term memory Following Commands: Follows one step commands with increased time Safety/Judgement: Decreased awareness of safety;Decreased awareness of deficits Awareness: Intellectual Problem Solving: Slow processing;Decreased initiation;Difficulty sequencing;Requires verbal cues;Requires tactile cues General Comments: Pt with delayed processing.  Pt with very little output with OT - question if due to embarrassment with family in room.  RN reports pt's demeanor changed after PT   General Comments       Exercises       Shoulder Instructions      Home Living Family/patient expects to be discharged to:: Inpatient rehab Living Arrangements: Alone Available Help at Discharge: Family;Available 24 hours/day Type of Home: Apartment Home Access: Level entry     Home Layout: One level               Home Equipment: None          Prior Functioning/Environment Level of Independence: Independent with assistive device(s)        Comments: Pt does not drive - reports dtr drives him    OT Diagnosis: Generalized weakness;Cognitive deficits;Disturbance of vision   OT Problem List: Decreased strength;Decreased range of motion;Decreased activity tolerance;Impaired balance (sitting and/or standing);Impaired vision/perception;Decreased cognition;Decreased safety awareness;Decreased knowledge of use of DME or AE;Impaired UE functional use   OT Treatment/Interventions: Self-care/ADL  training;Neuromuscular education;DME and/or AE instruction;Therapeutic activities;Cognitive remediation/compensation;Visual/perceptual remediation/compensation;Patient/family education;Balance training    OT Goals(Current goals can be found in the care plan section) Acute Rehab OT Goals Patient Stated Goal: Pt did not state OT Goal Formulation: With patient Time For Goal Achievement: 05/11/14 Potential to Achieve Goals: Good ADL Goals Pt Will Perform Eating: with set-up;sitting Pt Will Perform Grooming: with set-up;sitting Pt Will Perform Upper Body Bathing: with supervision;sitting Pt Will Perform Lower Body Bathing: with mod assist;sit to/from stand Pt Will Perform Upper Body Dressing: with min assist;sitting Pt Will Transfer to Toilet: with mod assist;squat pivot transfer;bedside commode Additional ADL Goal #1: Pt will participate in further visual assessment  OT Frequency: Min 2X/week   Barriers to D/C: Decreased caregiver support          Co-evaluation              End of Session Nurse Communication: Mobility status  Activity Tolerance:   Patient left: in bed;with call bell/phone within reach;with family/visitor present;with bed alarm set   Time: [id number] OT Time Calculation (min): 20 min Charges:  OT General Charges $OT Visit: 1 Procedure OT Evaluation $Initial OT Evaluation Tier I: 1 Procedure OT Treatments $Therapeutic Activity: 8-22 mins G-Codes:    Tedra Coppernoll M Nicki Gracy 04/27/2014, 3:28 PM

## 2014-04-27 NOTE — Progress Notes (Signed)
Speech Language Pathology Treatment: Dysphagia  Patient Details Name: Nicholas [name] MRN: [id number] DOB: 01/26/1951 Today's Date: 04/27/2014 Time: [id number] SLP Time Calculation (min): 27 min  Assessment / Plan / Recommendation Clinical Impression  Pt tolerating dys 3 (mechanical soft) diet well, self feeding with mild impulsivity, but not in danger. Sip size adequate with thin liquids, no signs of choking or cough. Pt is safe to continue this diet with reduced supervision level. Would not upgrade diet yet to facilitate self feeding and ease of intake. Will f/u x1 to upgrade to regular texture if pt continue to tolerate well.    HPI HPI: 63 year old male admitted with acute encephalopathy with possible origins including stroke vs postictal state (MRI pending and  abdominal pain. PMH of CEA, GERD, CVA, head injury.    Pertinent Vitals NA  SLP Plan  Continue with current plan of care    Recommendations Diet recommendations: Dysphagia 3 (mechanical soft);Thin liquid Liquids provided via: Cup;Straw Medication Administration: Whole meds with puree Supervision: Patient able to self feed;Staff to assist with self feeding Compensations: Slow rate;Small sips/bites Postural Changes and/or Swallow Maneuvers: Seated upright 90 degrees;Upright 30-60 min after meal              Oral Care Recommendations: Oral care BID Follow up Recommendations: Inpatient Rehab Plan: Continue with current plan of care    GO    [facility] Medical CenterBonnie Neville [place], MA CCC-SLP [phone]  Nicholas [name] 04/27/2014, 4:35 PM

## 2014-04-27 NOTE — Evaluation (Signed)
Physical Therapy Evaluation Patient Details Name: Nicholas [name] MRN: [id number] DOB: 08/29/1951 Today's Date: 04/27/2014   History of Present Illness  Pt admitted with RLE weakness and fall with L ACA CVA  Clinical Impression  Pt pleasant with decreased mobility, cognition, vision, balance, ability to follow commands and function at present who will benefit from acute therapy to maximize function and independence to decrease burden of care. Pt with flat affect and reports he was living alone but has 8 sisters in the area who would be able to assist caring for him but none present to confirm. P.T. Drew a Art therapistflower and pt able to state that he say a flower and asked to duplicate it but pt drew a foot with perseveration over the lines and when asked why he said that's what he sees in his head. Asked to draw flower again and pt drew a spoon again stating he sees a flower on paper but a spoon in his head. Pt able to state that his balance and mobility are effected as well as blurry vision continually as deficits. Pt did report vertical diplopia in standing. Will follow acutely with RN notified of mobility and function and current need for lift equipment for OOB.     Follow Up Recommendations CIR;Supervision/Assistance - 24 hour    Equipment Recommendations  Wheelchair (measurements PT);Wheelchair cushion (measurements PT)    Recommendations for Other Services Rehab consult;OT consult     Precautions / Restrictions Precautions Precautions: Fall Precaution Comments: blurry vision      Mobility  Bed Mobility Overal bed mobility: Needs Assistance Bed Mobility: Supine to Sit;Sit to Supine;Rolling Rolling: Min assist   Supine to sit: Max assist Sit to supine: Max assist   General bed mobility comments: min assist with rail, use of pad and max cueing for sequence and hand over hand placement. With supine to sit HOB elevated and assist to pivot to EOB and elevate trunk. Pt unable to shift  weight anteriorly even with assist to attempt transfers OOb. Assist for bil LE to return to supine, sequential cues and mod assist with bed in trendelenburg [facility]  Transfers                    Ambulation/Gait                Stairs            Wheelchair Mobility    Modified Rankin (Stroke Patients Only)       Balance Overall balance assessment: Needs assistance   Sitting balance-Leahy Scale: Poor Sitting balance - Comments: pt maintains trunk flexion with posterior right lean grossly 5 min. unable to perform anterior translation with assist Postural control: Posterior lean;Right lateral lean                                   Pertinent Vitals/Pain No pain 134/80 supine    Home Living Family/patient expects to be discharged to:: Inpatient rehab Living Arrangements: Alone Available Help at Discharge: Family;Available 24 hours/day Type of Home: Apartment Home Access: Level entry     Home Layout: One level Home Equipment: None      Prior Function Level of Independence: Independent               Hand Dominance        Extremity/Trunk Assessment   Upper Extremity Assessment: Generalized weakness  Lower Extremity Assessment: RLE deficits/detail;LLE deficits/detail RLE Deficits / Details: pt with grossly 2+/5  pt able to move legs in bed and in sitting but unable to complete full ROM in either position and assume more related to cognition then strength. Pt able to localize touch and reports equal LLE Deficits / Details: pt with grossly 2+/5  pt able to move legs in bed and in sitting but unable to complete full ROM in either position and assume more related to cognition then strength. Pt able to localize touch and reports equal  Cervical / Trunk Assessment: Normal  Communication   Communication: No difficulties  Cognition Arousal/Alertness: Awake/alert Behavior During Therapy: Flat affect Overall  Cognitive Status: Impaired/Different from baseline Area of Impairment: Memory;Following commands;Safety/judgement;Awareness;Problem solving;Orientation Orientation Level: Time   Memory: Decreased short-term memory Following Commands: Follows one step commands with increased time Safety/Judgement: Decreased awareness of deficits;Decreased awareness of safety   Problem Solving: Slow processing;Decreased initiation;Difficulty sequencing;Requires verbal cues;Requires tactile cues General Comments: Pt with grossly 10sec delay with most questions for PLOF, requires cues for sequencing all transfers, perseverating at times    General Comments      Exercises        Assessment/Plan    PT Assessment Patient needs continued PT services  PT Diagnosis Abnormality of gait;Altered mental status   PT Problem List Decreased strength;Decreased cognition;Decreased knowledge of use of DME;Decreased activity tolerance;Decreased safety awareness;Decreased balance;Decreased mobility;Decreased coordination  PT Treatment Interventions DME instruction;Functional mobility training;Therapeutic activities;Therapeutic exercise;Patient/family education;Cognitive remediation;Neuromuscular re-education;Balance training   PT Goals (Current goals can be found in the Care Plan section) Acute Rehab PT Goals Patient Stated Goal: be able to walk PT Goal Formulation: With patient Time For Goal Achievement: 05/11/14 Potential to Achieve Goals: Fair    Frequency Min 4X/week   Barriers to discharge Decreased caregiver support      Co-evaluation               End of Session Equipment Utilized During Treatment: Gait belt Activity Tolerance: Patient limited by fatigue Patient left: in bed;with call bell/phone within reach;with bed alarm set Nurse Communication: Mobility status;Precautions;Need for lift equipment         Time: 1000-1026 PT Time Calculation (min): 26 min   Charges:   PT  Evaluation $Initial PT Evaluation Tier I: 1 Procedure PT Treatments $Therapeutic Activity: 23-37 mins   PT G Codes:          Colman Birdwell B Janiel Derhammer 04/27/2014, 10:37 AM Delaney MeigsMaija Tabor Carnel Stegman, PT [phone]

## 2014-04-27 NOTE — Progress Notes (Signed)
Stroke Team Progress Note  HISTORY Nicholas FullingWillie J [name] is an 63 y.o. male who lives alone at home. He went to sleep at baseline yesterday and woke up around 3 AM to go to the bathroom. Patient fell and possibly lost consciousness. Patient had earlier called his brother because he had some hematuria. By the time EMS reached patient was found to be on the floor. On way to ED he became lethargic and has remained lethargic since. Per EMS he has some left facial droop and left sided weakness. Currently he has no further facial droop. Initial CT head showed a left hypodensity and follow up MRI confirms this as a stroke in the left ACA territory. Currently he is very Drowsy able to follow commands. He is repeating "I had apples" and then perseverating on last words stated.   Patient was not administered TPA secondary to being outside the window.  He was admitted to the [facility] for further evaluation and treatment.  SUBJECTIVE Currently resting comfortably. Had EEG which was unremarkable.   OBJECTIVE Most recent Vital Signs: Filed Vitals:   04/27/14 0400 04/27/14 0600 04/27/14 0800 04/27/14 1031  BP: 146/59 176/65 159/60 134/80  Pulse: 82 128    Temp: 98.3 F (36.8 C) 97.9 F (36.6 C)    TempSrc:      Resp: 18 16    Weight:  235 lb 3.2 oz (106.686 kg)    SpO2: 92% 94%     CBG (last 3)   Recent Labs  04/26/14 1647 04/26/14 2323 04/27/14 0744  GLUCAP 123* 142* 87    IV Fluid Intake:     MEDICATIONS  . aspirin  300 mg Rectal Daily   Or  . aspirin  325 mg Oral Daily  . atorvastatin  10 mg Oral q1800  . bisacodyl  10 mg Rectal Once  . diltiazem  360 mg Oral Daily  . docusate sodium  100 mg Oral BID  . doxazosin  2 mg Oral Daily  . enoxaparin (LOVENOX) injection  40 mg Subcutaneous Q24H  . glipiZIDE  5 mg Oral Daily  . insulin aspart  0-9 Units Subcutaneous TID WC  . metoprolol  50 mg Oral BID   PRN:  diltiazem, ketorolac, senna-docusate  Diet:  Dysphagia  Activity:  Up with  assistance DVT Prophylaxis:  lovenox  CLINICALLY SIGNIFICANT STUDIES Basic Metabolic Panel:  Recent Labs Lab 04/26/14 0350 04/26/14 0935  NA 140 140  K 3.8 3.9  CL 102 102  CO2 25 24  GLUCOSE 108* 93  BUN 8 7  CREATININE 0.92 0.97  CALCIUM 9.4 9.2   Liver Function Tests:  Recent Labs Lab 04/26/14 0350 04/26/14 0935  AST 28 30  ALT 28 28  ALKPHOS 77 80  BILITOT 0.7 0.8  PROT 7.3 6.9  ALBUMIN 3.9 3.8   CBC:  Recent Labs Lab 04/26/14 0350 04/26/14 0935  WBC 7.5 7.9  NEUTROABS  --  5.0  HGB 13.8 14.1  HCT 39.9 41.5  MCV 95.2 97.6  PLT 361 329   Coagulation: No results found for this basename: LABPROT, INR,  in the last 168 hours Cardiac Enzymes:  Recent Labs Lab 04/26/14 0935  CKTOTAL 487*  TROPONINI <0.30   Urinalysis:  Recent Labs Lab 04/26/14 0512  COLORURINE YELLOW  LABSPEC 1.019  PHURINE 7.5  GLUCOSEU NEGATIVE  HGBUR NEGATIVE  BILIRUBINUR NEGATIVE  KETONESUR NEGATIVE  PROTEINUR NEGATIVE  UROBILINOGEN 1.0  NITRITE NEGATIVE  LEUKOCYTESUR NEGATIVE   Lipid Panel    Component  Value Date/Time   CHOL 112 04/27/2014 0643   TRIG 103 04/27/2014 0643   HDL 38* 04/27/2014 0643   CHOLHDL 2.9 04/27/2014 0643   VLDL 21 04/27/2014 0643   LDLCALC 53 04/27/2014 0643   HgbA1C  Lab Results  Component Value Date   HGBA1C 12.7* 09/25/2011    Urine Drug Screen:     Component Value Date/Time   LABOPIA NONE DETECTED 04/26/2014 0512   COCAINSCRNUR NONE DETECTED 04/26/2014 0512   LABBENZ NONE DETECTED 04/26/2014 0512   AMPHETMU NONE DETECTED 04/26/2014 0512   THCU NONE DETECTED 04/26/2014 0512   LABBARB NONE DETECTED 04/26/2014 0512    Alcohol Level:  Recent Labs Lab 04/26/14 0350  ETH <11    Ct Abdomen Pelvis Wo Contrast  04/26/2014   CLINICAL DATA:  Abdominal pain.  Evaluate for hydronephrosis.  EXAM: CT ABDOMEN AND PELVIS WITHOUT CONTRAST  TECHNIQUE: Multidetector CT imaging of the abdomen and pelvis was performed following the standard protocol without  IV contrast.  COMPARISON:  No priors.  FINDINGS: Lung Bases: Calcifications of the aortic valve. Atherosclerotic calcifications in the right coronary artery.  Abdomen/Pelvis: No abnormal calcifications are identified within the collecting system of either kidney, along the course of either ureter, or within the lumen of the urinary bladder. No hydroureteronephrosis to suggest urinary tract obstruction at this time. The unenhanced appearance of the kidneys is unremarkable bilaterally.  The unenhanced appearance of the liver, gallbladder, pancreas, spleen and bilateral adrenal glands is unremarkable. Atherosclerosis throughout the abdominal and pelvic vasculature, without evidence of aneurysm. Small umbilical hernia containing only omental fat incidentally noted. No evidence of bowel incarceration or obstruction at this time. No significant volume of ascites. No pneumoperitoneum. Numerous colonic diverticulae are noted, most pronounced in the descending and sigmoid colon, without surrounding inflammatory changes to suggest an acute diverticulitis at this time. Normal appendix. Prostate gland and urinary bladder are unremarkable in appearance.  Musculoskeletal: There are no aggressive appearing lytic or blastic lesions noted in the visualized portions of the skeleton.  IMPRESSION: 1. No acute findings in the abdomen or pelvis. 2. Specifically, no abnormal urinary tract calculi or findings of urinary tract obstruction at this time. 3. Colonic diverticulosis without findings to suggest acute diverticulitis at this time. 4. Normal appendix. 5. Small umbilical hernia containing a small amount of omental fat. No associated bowel incarceration or obstruction at this time. 6. Atherosclerosis, including right coronary artery disease. Please note that although the presence of coronary artery calcium documents the presence of coronary artery disease, the severity of this disease and any potential stenosis cannot be assessed on  this non-gated CT examination. Assessment for potential risk factor modification, dietary therapy or pharmacologic therapy may be warranted, if clinically indicated. 7. There are calcifications of the aortic valve. Echocardiographic correlation for evaluation of potential valvular dysfunction may be warranted if clinically indicated.   Electronically Signed   By: Trudie Reed M.D.   On: 04/26/2014 06:37   Dg Chest 2 View  04/26/2014   CLINICAL DATA:  Stroke.  EXAM: CHEST  2 VIEW  COMPARISON:  No prior.  FINDINGS: Bilateral pulmonary infiltrates are present. Differential diagnosis includes pulmonary edema and/or pneumonia. Borderline cardiomegaly with mild pulmonary vascular prominence. No pleural effusion or pneumothorax.  IMPRESSION: Bilateral pulmonary infiltrates. Differential diagnosis includes pulmonary edema and/or node bilateral pneumonia.   Electronically Signed   By: Maisie Fus  Register   On: 04/26/2014 12:13   Ct Head Wo Contrast  04/26/2014   CLINICAL DATA:  Weakness.  EXAM: CT HEAD WITHOUT CONTRAST  TECHNIQUE: Contiguous axial images were obtained from the base of the skull through the vertex without intravenous contrast.  COMPARISON:  CT NECK W/CM dated 07/03/2010; CT HEAD W/O CM dated 04/02/2009  FINDINGS: The ventricles and sulci are normal for age. No intraparenchymal hemorrhage, mass effect nor midline shift. Patchy supratentorial white matter hypodensities are within normal range for patient's age and though non-specific suggest sequelae of chronic small vessel ischemic disease. Focal loss of the left medial frontal/ cingulate gray-white matter differentiation (axial 20/31). Right frontoparietal cystic encephalomalacia with mild ex vacuo dilatation of the subjacent ventricle.  No abnormal extra-axial fluid collections. Basal cisterns are patent. Moderate calcific atherosclerosis of the carotid siphons.  No skull fracture. The included ocular globes and orbital contents are non-suspicious. Mild  paranasal sinus mucosal thickening without air-fluid levels.  IMPRESSION: Focal loss of the left medial frontal gray white matter differentiation concerning for acute to subacute ischemia, left anterior cerebral artery territory. MRI of the brain with diffusion-weighted sequences would be more sensitive.  Remote right frontoparietal infarcts.  Findings discussed with and reconfirmed by Dr.COURTNEY, HORTON on5/18/2015at4:39 AM.   Electronically Signed   By: Awilda Metroourtnay  Bloomer   On: 04/26/2014 04:40   Mr Brain Wo Contrast  04/26/2014   CLINICAL DATA:  Loss of consciousness. Stroke. Chronic left lower extremity weakness. Left upper extremity weakness and and  EXAM: MRI HEAD WITHOUT CONTRAST  MRA HEAD WITHOUT CONTRAST  TECHNIQUE: Multiplanar, multiecho pulse sequences of the brain and surrounding structures were obtained without intravenous contrast. Angiographic images of the head were obtained using MRA technique without contrast.  COMPARISON:  CT head from the same day.  FINDINGS: MRI HEAD FINDINGS  The diffusion-weighted images demonstrate an acute nonhemorrhagic infarct within the left ACA territory. No hemorrhage or mass lesion is present.  A remote right parietal lobe infarct is evident. Mild generalized atrophy is noted. There is ex vacuo dilation of the right lateral ventricle compatible with the prior infarct.  Right ICA and MCA conclusion is noted.  MRA HEAD FINDINGS  The MRA study is moderately degraded by patient motion. The right internal carotid artery is occluded. The left internal carotid artery is unremarkable. There is mild irregularity in the left A1 and M1 segments, likely exaggerated by patient motion. There is some flow in the right A1 segment. Terminal right ICA is reconstituted at the level of the right posterior communicating artery although no dominant artery is seen.  The right vertebral artery is dominant to the left. The basilar artery is within normal limits. There is moderate signal  loss at the origin of the left posterior cerebral artery. Both posterior cerebral arteries originate from the basilar tip.  IMPRESSION: 1. Acute nonhemorrhagic left ACA territory infarct. 2. Remote right parietal lobe infarct. 3. 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 These results will be called to the ordering clinician or representative by the Radiologist Assistant, and communication documented in the PACS or zVision Dashboard.   Electronically Signed   By: Gennette Pachris  Mattern M.D.   On: 04/26/2014 11:46   Mr Maxine GlennMra Head/brain Wo Cm  04/26/2014   CLINICAL DATA:  Loss of consciousness. Stroke. Chronic left lower extremity weakness. Left upper extremity weakness and and  EXAM: MRI HEAD WITHOUT CONTRAST  MRA HEAD WITHOUT CONTRAST  TECHNIQUE: Multiplanar, multiecho pulse sequences of the brain and surrounding structures were obtained without intravenous contrast. Angiographic images of the head were  obtained using MRA technique without contrast.  COMPARISON:  CT head from the same day.  FINDINGS: MRI HEAD FINDINGS  The diffusion-weighted images demonstrate an acute nonhemorrhagic infarct within the left ACA territory. No hemorrhage or mass lesion is present.  A remote right parietal lobe infarct is evident. Mild generalized atrophy is noted. There is ex vacuo dilation of the right lateral ventricle compatible with the prior infarct.  Right ICA and MCA conclusion is noted.  MRA HEAD FINDINGS  The MRA study is moderately degraded by patient motion. The right internal carotid artery is occluded. The left internal carotid artery is unremarkable. There is mild irregularity in the left A1 and M1 segments, likely exaggerated by patient motion. There is some flow in the right A1 segment. Terminal right ICA is reconstituted at the level of the right posterior communicating artery although no dominant artery is seen.  The right vertebral artery is  dominant to the left. The basilar artery is within normal limits. There is moderate signal loss at the origin of the left posterior cerebral artery. Both posterior cerebral arteries originate from the basilar tip.  IMPRESSION: 1. Acute nonhemorrhagic left ACA territory infarct. 2. Remote right parietal lobe infarct. 3. 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 These results will be called to the ordering clinician or representative by the Radiologist Assistant, and communication documented in the PACS or zVision Dashboard.   Electronically Signed   By: Gennette Pac M.D.   On: 04/26/2014 11:46    CT of the brain    MRI of the brain    MRA of the brain    2D Echocardiogram    Carotid Doppler    CXR    EKG   Therapy Recommendations pending  Physical Exam   Awake, slightly lethargic, oriented to name, "May 2015" but doesn't know date. Able to follow simple and multi-step commands. Mildly dysarthric speech, able to repeat  Cranial Nerves:  II: blinks to threat bialterally, pupils equal, round, reactive to light and accommodation  III,IV, VI: ptosis not present, extra-ocular motions intact bilaterally  V,VII: face symmetric, facial light touch sensation normal bilaterally  VIII: hearing normal bilaterally   Motor:  Left: Upper extremity 5/5 right: Upper extremity 4+/5  LeftLower extremity 5/5 rightLower extremity 5-/5  Tone and bulk:normal tone throughout; no atrophy noted   Sensory: light touch intact throughout, bilaterally   Cerebellar:  normal finger-to-nose, normal heel-to-shin test    ASSESSMENT Mr. KAIMANI CLAYSON is a 63 y.o. male presenting with lethargy, confusion and weakness. No tPA given as outside the window. Imaging confirms L ACA infarct. Infarct felt to be embolic secondary to unclear reason, question possible A fib.  On ASA 325mg  prior to admission. Now on ASA 325mg  for secondary  stroke prevention. Patient with resultant mild right sided weakness. Stroke work up underway.   HTN  LDL   DM  CAD   Hospital day # 1  TREATMENT/PLAN  Consider switch to Plavix 75mg  daily  Pending Hemoglobin A1c  Continue Lipitor 10mg   Check carotid ultrasound, 2D echo  Rehab evaluation  Risk factor modification    Elspeth Cho, DO [facility] Pager: [phone]     To contact Stroke Continuity provider, please refer to [email or web address]. After hours, contact General Neurology

## 2014-04-27 NOTE — Progress Notes (Signed)
Rehab Admissions Coordinator Note:  Patient was screened by Trish MageEugenia M Emine Lopata for appropriateness for an Inpatient Acute Rehab Consult.  At this time, we are recommending Inpatient Rehab consult.  Ellouise Newerugenia M Elsye Mccollister 04/27/2014, 2:02 PM  I can be reached at [phone].

## 2014-04-27 NOTE — Progress Notes (Signed)
[facility] PROGRESS NOTE  Nicholas [name] RUE:[id number] DOB: [birth year] DOA: 04/26/2014 PCP: [name], MD  Assessment/Plan: 1. Acute encephalopathy with acute CVA 1. L ACA acute CVA on MRI 2. 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 Carotid doppler and 2d echo pending 4. PT/OT 5. Pt cleared for dysphagia 3 diet 6. EEG unremarkable. No seizures 2. Abdominal pain 1. UA w/o blood 2. CT personally reviewed. No acute process, however there appears to be stool throughout the colon 3. Cathartics ordered prn. 3. Diabetes mellitus type 2 1. continue glipizide. Hold metformin for now. Sliding-scale coverage. 4. Hyperlipidemia 1. CK levels mildly elevated at 487 2. Will decrease atorvastatin from 20mg  to 10mg  5. Hypertension 1. Elevated, however given acute CVA, will allow permissive HTN for now 6. CAD 1. denies any chest pain 2. Trop neg x 2  Code Status: Full Family Communication: Pt in room  Disposition Plan: Pending   Consultants:  Neurology  Procedures:  EEG 5/18  Antibiotics:    HPI/Subjective: No complaints. Pt more alert  Objective: Filed Vitals:   04/27/14 0226 04/27/14 0400 04/27/14 0600 04/27/14 0800  BP: 155/84 146/59 176/65 159/60  Pulse: 128 82 128   Temp:  98.3 F (36.8 C) 97.9 F (36.6 C)   TempSrc:      Resp: 16 18 16    Weight:   106.686 kg (235 lb 3.2 oz)   SpO2: 93% 92% 94%     Intake/Output Summary (Last 24 hours) at 04/27/14 0823 Last data filed at 04/27/14 1478  Gross per 24 hour  Intake    930 ml  Output   3757 ml  Net  -2827 ml   Filed Weights   04/27/14 0600  Weight: 106.686 kg (235 lb 3.2 oz)    Exam:   General:  Awake, in nad  Cardiovascular: regular, s1, s2  Respiratory: normal resp effort, no wheezing  Abdomen: soft,nondistended  Musculoskeletal: perfused,no clubbing   Data Reviewed: Basic Metabolic Panel:  Recent Labs Lab 04/26/14 0350 04/26/14 0935  NA 140 140  K  3.8 3.9  CL 102 102  CO2 25 24  GLUCOSE 108* 93  BUN 8 7  CREATININE 0.92 0.97  CALCIUM 9.4 9.2   Liver Function Tests:  Recent Labs Lab 04/26/14 0350 04/26/14 0935  AST 28 30  ALT 28 28  ALKPHOS 77 80  BILITOT 0.7 0.8  PROT 7.3 6.9  ALBUMIN 3.9 3.8   No results found for this basename: LIPASE, AMYLASE,  in the last 168 hours No results found for this basename: AMMONIA,  in the last 168 hours CBC:  Recent Labs Lab 04/26/14 0350 04/26/14 0935  WBC 7.5 7.9  NEUTROABS  --  5.0  HGB 13.8 14.1  HCT 39.9 41.5  MCV 95.2 97.6  PLT 361 329   Cardiac Enzymes:  Recent Labs Lab 04/26/14 0935  CKTOTAL 487*  TROPONINI <0.30   BNP (last 3 results) No results found for this basename: PROBNP,  in the last 8760 hours CBG:  Recent Labs Lab 04/26/14 0805 04/26/14 1227 04/26/14 1647 04/26/14 2323 04/27/14 0744  GLUCAP 99 168* 123* 142* 87    No results found for this or any previous visit (from the past 240 hour(s)).   Studies: Ct Abdomen Pelvis Wo Contrast  04/26/2014   CLINICAL DATA:  Abdominal pain.  Evaluate for hydronephrosis.  EXAM: CT ABDOMEN AND PELVIS WITHOUT CONTRAST  TECHNIQUE: Multidetector CT imaging of the abdomen and pelvis was performed  following the standard protocol without IV contrast.  COMPARISON:  No priors.  FINDINGS: Lung Bases: Calcifications of the aortic valve. Atherosclerotic calcifications in the right coronary artery.  Abdomen/Pelvis: No abnormal calcifications are identified within the collecting system of either kidney, along the course of either ureter, or within the lumen of the urinary bladder. No hydroureteronephrosis to suggest urinary tract obstruction at this time. The unenhanced appearance of the kidneys is unremarkable bilaterally.  The unenhanced appearance of the liver, gallbladder, pancreas, spleen and bilateral adrenal glands is unremarkable. Atherosclerosis throughout the abdominal and pelvic vasculature, without evidence of  aneurysm. Small umbilical hernia containing only omental fat incidentally noted. No evidence of bowel incarceration or obstruction at this time. No significant volume of ascites. No pneumoperitoneum. Numerous colonic diverticulae are noted, most pronounced in the descending and sigmoid colon, without surrounding inflammatory changes to suggest an acute diverticulitis at this time. Normal appendix. Prostate gland and urinary bladder are unremarkable in appearance.  Musculoskeletal: There are no aggressive appearing lytic or blastic lesions noted in the visualized portions of the skeleton.  IMPRESSION: 1. No acute findings in the abdomen or pelvis. 2. Specifically, no abnormal urinary tract calculi or findings of urinary tract obstruction at this time. 3. Colonic diverticulosis without findings to suggest acute diverticulitis at this time. 4. Normal appendix. 5. Small umbilical hernia containing a small amount of omental fat. No associated bowel incarceration or obstruction at this time. 6. Atherosclerosis, including right coronary artery disease. Please note that although the presence of coronary artery calcium documents the presence of coronary artery disease, the severity of this disease and any potential stenosis cannot be assessed on this non-gated CT examination. Assessment for potential risk factor modification, dietary therapy or pharmacologic therapy may be warranted, if clinically indicated. 7. There are calcifications of the aortic valve. Echocardiographic correlation for evaluation of potential valvular dysfunction may be warranted if clinically indicated.   Electronically Signed   By: Trudie Reedaniel  Entrikin M.D.   On: 04/26/2014 06:37   Dg Chest 2 View  04/26/2014   CLINICAL DATA:  Stroke.  EXAM: CHEST  2 VIEW  COMPARISON:  No prior.  FINDINGS: Bilateral pulmonary infiltrates are present. Differential diagnosis includes pulmonary edema and/or pneumonia. Borderline cardiomegaly with mild pulmonary vascular  prominence. No pleural effusion or pneumothorax.  IMPRESSION: Bilateral pulmonary infiltrates. Differential diagnosis includes pulmonary edema and/or node bilateral pneumonia.   Electronically Signed   By: Maisie Fushomas  Register   On: 04/26/2014 12:13   Ct Head Wo Contrast  04/26/2014   CLINICAL DATA:  Weakness.  EXAM: CT HEAD WITHOUT CONTRAST  TECHNIQUE: Contiguous axial images were obtained from the base of the skull through the vertex without intravenous contrast.  COMPARISON:  CT NECK W/CM dated 07/03/2010; CT HEAD W/O CM dated 04/02/2009  FINDINGS: The ventricles and sulci are normal for age. No intraparenchymal hemorrhage, mass effect nor midline shift. Patchy supratentorial white matter hypodensities are within normal range for patient's age and though non-specific suggest sequelae of chronic small vessel ischemic disease. Focal loss of the left medial frontal/ cingulate gray-white matter differentiation (axial 20/31). Right frontoparietal cystic encephalomalacia with mild ex vacuo dilatation of the subjacent ventricle.  No abnormal extra-axial fluid collections. Basal cisterns are patent. Moderate calcific atherosclerosis of the carotid siphons.  No skull fracture. The included ocular globes and orbital contents are non-suspicious. Mild paranasal sinus mucosal thickening without air-fluid levels.  IMPRESSION: Focal loss of the left medial frontal gray white matter differentiation concerning for acute to subacute ischemia,  left anterior cerebral artery territory. MRI of the brain with diffusion-weighted sequences would be more sensitive.  Remote right frontoparietal infarcts.  Findings discussed with and reconfirmed by Dr.COURTNEY, HORTON on5/18/2015at4:39 AM.   Electronically Signed   By: Awilda Metro   On: 04/26/2014 04:40   Mr Brain Wo Contrast  04/26/2014   CLINICAL DATA:  Loss of consciousness. Stroke. Chronic left lower extremity weakness. Left upper extremity weakness and and  EXAM: MRI HEAD WITHOUT  CONTRAST  MRA HEAD WITHOUT CONTRAST  TECHNIQUE: Multiplanar, multiecho pulse sequences of the brain and surrounding structures were obtained without intravenous contrast. Angiographic images of the head were obtained using MRA technique without contrast.  COMPARISON:  CT head from the same day.  FINDINGS: MRI HEAD FINDINGS  The diffusion-weighted images demonstrate an acute nonhemorrhagic infarct within the left ACA territory. No hemorrhage or mass lesion is present.  A remote right parietal lobe infarct is evident. Mild generalized atrophy is noted. There is ex vacuo dilation of the right lateral ventricle compatible with the prior infarct.  Right ICA and MCA conclusion is noted.  MRA HEAD FINDINGS  The MRA study is moderately degraded by patient motion. The right internal carotid artery is occluded. The left internal carotid artery is unremarkable. There is mild irregularity in the left A1 and M1 segments, likely exaggerated by patient motion. There is some flow in the right A1 segment. Terminal right ICA is reconstituted at the level of the right posterior communicating artery although no dominant artery is seen.  The right vertebral artery is dominant to the left. The basilar artery is within normal limits. There is moderate signal loss at the origin of the left posterior cerebral artery. Both posterior cerebral arteries originate from the basilar tip.  IMPRESSION: 1. Acute nonhemorrhagic left ACA territory infarct. 2. Remote right parietal lobe infarct. 3. 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 These results will be called to the ordering clinician or representative by the Radiologist Assistant, and communication documented in the PACS or zVision Dashboard.   Electronically Signed   By: Gennette Pac M.D.   On: 04/26/2014 11:46   Mr Maxine Glenn Head/brain Wo Cm  04/26/2014   CLINICAL DATA:  Loss of consciousness. Stroke. Chronic left  lower extremity weakness. Left upper extremity weakness and and  EXAM: MRI HEAD WITHOUT CONTRAST  MRA HEAD WITHOUT CONTRAST  TECHNIQUE: Multiplanar, multiecho pulse sequences of the brain and surrounding structures were obtained without intravenous contrast. Angiographic images of the head were obtained using MRA technique without contrast.  COMPARISON:  CT head from the same day.  FINDINGS: MRI HEAD FINDINGS  The diffusion-weighted images demonstrate an acute nonhemorrhagic infarct within the left ACA territory. No hemorrhage or mass lesion is present.  A remote right parietal lobe infarct is evident. Mild generalized atrophy is noted. There is ex vacuo dilation of the right lateral ventricle compatible with the prior infarct.  Right ICA and MCA conclusion is noted.  MRA HEAD FINDINGS  The MRA study is moderately degraded by patient motion. The right internal carotid artery is occluded. The left internal carotid artery is unremarkable. There is mild irregularity in the left A1 and M1 segments, likely exaggerated by patient motion. There is some flow in the right A1 segment. Terminal right ICA is reconstituted at the level of the right posterior communicating artery although no dominant artery is seen.  The right vertebral artery is dominant to the left. The  basilar artery is within normal limits. There is moderate signal loss at the origin of the left posterior cerebral artery. Both posterior cerebral arteries originate from the basilar tip.  IMPRESSION: 1. Acute nonhemorrhagic left ACA territory infarct. 2. Remote right parietal lobe infarct. 3. 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 These results will be called to the ordering clinician or representative by the Radiologist Assistant, and communication documented in the PACS or zVision Dashboard.   Electronically Signed   By: Gennette Pachris  Mattern M.D.   On: 04/26/2014 11:46    Scheduled  Meds: . aspirin  300 mg Rectal Daily   Or  . aspirin  325 mg Oral Daily  . atorvastatin  20 mg Oral q1800  . bisacodyl  10 mg Rectal Once  . diltiazem  360 mg Oral Daily  . docusate sodium  100 mg Oral BID  . doxazosin  2 mg Oral Daily  . enoxaparin (LOVENOX) injection  40 mg Subcutaneous Q24H  . glipiZIDE  5 mg Oral Daily  . insulin aspart  0-9 Units Subcutaneous TID WC  . metoprolol  50 mg Oral BID   Continuous Infusions: . sodium chloride 50 mL/hr at 04/27/14 [id number]    Principal Problem:   Acute encephalopathy Active Problems:   HYPERLIPIDEMIA-MIXED   HYPERTENSION, UNSPECIFIED   CAD, NATIVE VESSEL   Stroke   Abdominal pain  Time spent: 35min  Jerald KiefStephen K Freeland Pracht  [facility] Pager [phone]. If 7PM-7AM, please contact night-coverage at [email or web address], password [email or web address] 04/27/2014, 8:23 AM  LOS: 1 day

## 2014-04-27 NOTE — Progress Notes (Signed)
  Echocardiogram 2D Echocardiogram has been performed.  Arvil ChacoRachel Foster 04/27/2014, 3:32 PM

## 2014-04-27 NOTE — Progress Notes (Signed)
VASCULAR LAB PRELIMINARY  PRELIMINARY  PRELIMINARY  PRELIMINARY  Carotid duplex  completed.    Preliminary report:  Bilateral:  1-39% ICA stenosis.  Right: carotid waveforms consistent with distal occlusion.  Bilateral: Vertebral artery flow is antegrade.      Gara KronerHelene F Talib Headley, RVT 04/27/2014, 5:26 PM

## 2014-04-28 ENCOUNTER — Encounter (HOSPITAL_COMMUNITY): Payer: Self-pay | Admitting: *Deleted

## 2014-04-28 ENCOUNTER — Inpatient Hospital Stay (HOSPITAL_COMMUNITY)
Admission: RE | Admit: 2014-04-28 | Discharge: 2014-05-15 | DRG: 945 | Disposition: A | Discharge status: Home Health | Payer: Medicare Other | Source: Intra-hospital | Attending: Physical Medicine & Rehabilitation | Admitting: Physical Medicine & Rehabilitation

## 2014-04-28 DIAGNOSIS — Z823 Family history of stroke: Secondary | ICD-10-CM

## 2014-04-28 DIAGNOSIS — M216X9 Other acquired deformities of unspecified foot: Secondary | ICD-10-CM | POA: Diagnosis present

## 2014-04-28 DIAGNOSIS — E785 Hyperlipidemia, unspecified: Secondary | ICD-10-CM | POA: Diagnosis present

## 2014-04-28 DIAGNOSIS — I6340 Cerebral infarction due to embolism of unspecified cerebral artery: Secondary | ICD-10-CM | POA: Diagnosis present

## 2014-04-28 DIAGNOSIS — G8110 Spastic hemiplegia affecting unspecified side: Secondary | ICD-10-CM

## 2014-04-28 DIAGNOSIS — G9340 Encephalopathy, unspecified: Secondary | ICD-10-CM

## 2014-04-28 DIAGNOSIS — I639 Cerebral infarction, unspecified: Secondary | ICD-10-CM

## 2014-04-28 DIAGNOSIS — R279 Unspecified lack of coordination: Secondary | ICD-10-CM | POA: Diagnosis present

## 2014-04-28 DIAGNOSIS — Z7902 Long term (current) use of antithrombotics/antiplatelets: Secondary | ICD-10-CM

## 2014-04-28 DIAGNOSIS — I2510 Atherosclerotic heart disease of native coronary artery without angina pectoris: Secondary | ICD-10-CM

## 2014-04-28 DIAGNOSIS — I739 Peripheral vascular disease, unspecified: Secondary | ICD-10-CM | POA: Diagnosis present

## 2014-04-28 DIAGNOSIS — Z8249 Family history of ischemic heart disease and other diseases of the circulatory system: Secondary | ICD-10-CM

## 2014-04-28 DIAGNOSIS — Z9861 Coronary angioplasty status: Secondary | ICD-10-CM

## 2014-04-28 DIAGNOSIS — Z79899 Other long term (current) drug therapy: Secondary | ICD-10-CM

## 2014-04-28 DIAGNOSIS — Z8673 Personal history of transient ischemic attack (TIA), and cerebral infarction without residual deficits: Secondary | ICD-10-CM

## 2014-04-28 DIAGNOSIS — B958 Unspecified staphylococcus as the cause of diseases classified elsewhere: Secondary | ICD-10-CM | POA: Diagnosis not present

## 2014-04-28 DIAGNOSIS — K219 Gastro-esophageal reflux disease without esophagitis: Secondary | ICD-10-CM | POA: Diagnosis present

## 2014-04-28 DIAGNOSIS — I10 Essential (primary) hypertension: Secondary | ICD-10-CM | POA: Diagnosis present

## 2014-04-28 DIAGNOSIS — Z9181 History of falling: Secondary | ICD-10-CM

## 2014-04-28 DIAGNOSIS — E1142 Type 2 diabetes mellitus with diabetic polyneuropathy: Secondary | ICD-10-CM | POA: Diagnosis present

## 2014-04-28 DIAGNOSIS — Z5189 Encounter for other specified aftercare: Principal | ICD-10-CM

## 2014-04-28 DIAGNOSIS — N400 Enlarged prostate without lower urinary tract symptoms: Secondary | ICD-10-CM | POA: Diagnosis present

## 2014-04-28 DIAGNOSIS — R29898 Other symptoms and signs involving the musculoskeletal system: Secondary | ICD-10-CM | POA: Diagnosis present

## 2014-04-28 DIAGNOSIS — Z87891 Personal history of nicotine dependence: Secondary | ICD-10-CM

## 2014-04-28 DIAGNOSIS — E669 Obesity, unspecified: Secondary | ICD-10-CM | POA: Diagnosis present

## 2014-04-28 DIAGNOSIS — Z602 Problems related to living alone: Secondary | ICD-10-CM

## 2014-04-28 DIAGNOSIS — I252 Old myocardial infarction: Secondary | ICD-10-CM

## 2014-04-28 DIAGNOSIS — Z7982 Long term (current) use of aspirin: Secondary | ICD-10-CM

## 2014-04-28 DIAGNOSIS — E782 Mixed hyperlipidemia: Secondary | ICD-10-CM

## 2014-04-28 DIAGNOSIS — E1149 Type 2 diabetes mellitus with other diabetic neurological complication: Secondary | ICD-10-CM

## 2014-04-28 DIAGNOSIS — I6330 Cerebral infarction due to thrombosis of unspecified cerebral artery: Secondary | ICD-10-CM

## 2014-04-28 DIAGNOSIS — Z6832 Body mass index (BMI) 32.0-32.9, adult: Secondary | ICD-10-CM

## 2014-04-28 DIAGNOSIS — N390 Urinary tract infection, site not specified: Secondary | ICD-10-CM | POA: Diagnosis not present

## 2014-04-28 LAB — GLUCOSE, CAPILLARY
GLUCOSE-CAPILLARY: 157 mg/dL — AB (ref 70–99)
Glucose-Capillary: 92 mg/dL (ref 70–99)
Glucose-Capillary: 97 mg/dL (ref 70–99)
Glucose-Capillary: 178 mg/dL — ABNORMAL HIGH (ref 70–99)
Glucose-Capillary: 121 mg/dL — ABNORMAL HIGH (ref 70–99)

## 2014-04-28 LAB — CBC
HCT: 44.9 % (ref 39.0–52.0)
HEMOGLOBIN: 15.3 g/dL (ref 13.0–17.0)
MCH: 33.8 pg (ref 26.0–34.0)
MCHC: 34.1 g/dL (ref 30.0–36.0)
MCV: 99.3 fL (ref 78.0–100.0)
Platelets: 347 10*3/uL (ref 150–400)
RBC: 4.52 MIL/uL (ref 4.22–5.81)
RDW: 12.7 % (ref 11.5–15.5)
WBC: 8.9 10*3/uL (ref 4.0–10.5)

## 2014-04-28 LAB — CREATININE, SERUM
CREATININE: 1.01 mg/dL (ref 0.50–1.35)
GFR calc Af Amer: >90 mL/min (ref >90)
GFR, EST NON AFRICAN AMERICAN: 78 mL/min — AB (ref >90)

## 2014-04-28 MED ORDER — ASPIRIN 81 MG PO TBEC: 81.0000 mg | DELAYED_RELEASE_TABLET | Freq: Every day | ORAL | Status: DC

## 2014-04-28 MED ORDER — METOPROLOL TARTRATE 50 MG PO TABS
50.0000 mg | ORAL_TABLET | Freq: Two times a day (BID) | ORAL | Status: DC
  Administered 2014-04-28 – 2014-05-15 (×32): 50 mg via ORAL
  Filled 2014-04-28 (×38): qty 1

## 2014-04-28 MED ORDER — ENOXAPARIN SODIUM 40 MG/0.4ML ~~LOC~~ SOLN
40.0000 mg | SUBCUTANEOUS | Status: DC
Start: 2014-04-29 — End: 2014-05-15
  Administered 2014-04-29 – 2014-05-14 (×16): 40 mg via SUBCUTANEOUS
  Filled 2014-04-28 (×17): qty 0.4

## 2014-04-28 MED ORDER — DOXAZOSIN MESYLATE 2 MG PO TABS
2.0000 mg | ORAL_TABLET | Freq: Every day | ORAL | Status: DC
  Administered 2014-04-29 – 2014-05-15 (×17): 2 mg via ORAL
  Filled 2014-04-28 (×18): qty 1

## 2014-04-28 MED ORDER — ATORVASTATIN CALCIUM 10 MG PO TABS
10.0000 mg | ORAL_TABLET | Freq: Every day | ORAL | Status: DC
  Administered 2014-04-28 – 2014-05-14 (×17): 10 mg via ORAL
  Filled 2014-04-28 (×18): qty 1

## 2014-04-28 MED ORDER — ENOXAPARIN SODIUM 40 MG/0.4ML ~~LOC~~ SOLN: 40.0000 mg | SUBCUTANEOUS | Status: DC

## 2014-04-28 MED ORDER — ASPIRIN EC 81 MG PO TBEC
81.0000 mg | DELAYED_RELEASE_TABLET | Freq: Every day | ORAL | Status: DC
  Administered 2014-04-28: 81 mg via ORAL
  Filled 2014-04-28 (×2): qty 1

## 2014-04-28 MED ORDER — GLIPIZIDE ER 5 MG PO TB24
5.0000 mg | ORAL_TABLET | Freq: Every day | ORAL | Status: DC
  Administered 2014-04-29 – 2014-05-15 (×17): 5 mg via ORAL
  Filled 2014-04-28 (×18): qty 1

## 2014-04-28 MED ORDER — SENNOSIDES-DOCUSATE SODIUM 8.6-50 MG PO TABS: 1.0000 | ORAL_TABLET | Freq: Every evening | ORAL | Status: DC | PRN

## 2014-04-28 MED ORDER — ONDANSETRON HCL 4 MG/2ML IJ SOLN: 4.0000 mg | Freq: Four times a day (QID) | INTRAMUSCULAR | Status: DC | PRN

## 2014-04-28 MED ORDER — CLOPIDOGREL BISULFATE 75 MG PO TABS: 75.0000 mg | ORAL_TABLET | Freq: Every day | ORAL | Status: DC

## 2014-04-28 MED ORDER — ACETAMINOPHEN 325 MG PO TABS
325.0000 mg | ORAL_TABLET | ORAL | Status: DC | PRN
  Administered 2014-05-12 – 2014-05-15 (×2): 650 mg via ORAL
  Filled 2014-04-28 (×3): qty 2

## 2014-04-28 MED ORDER — INSULIN ASPART 100 UNIT/ML ~~LOC~~ SOLN
0.0000 [IU] | Freq: Three times a day (TID) | SUBCUTANEOUS | Status: DC
  Administered 2014-04-30: 2 [IU] via SUBCUTANEOUS
  Administered 2014-05-02: 1 [IU] via SUBCUTANEOUS

## 2014-04-28 MED ORDER — DILTIAZEM HCL ER BEADS 240 MG PO CP24
360.0000 mg | ORAL_CAPSULE | Freq: Every day | ORAL | Status: DC
  Administered 2014-04-29 – 2014-04-30 (×2): 360 mg via ORAL
  Filled 2014-04-28 (×4): qty 1

## 2014-04-28 MED ORDER — SORBITOL 70 % SOLN
30.0000 mL | Freq: Every day | Status: DC | PRN
  Administered 2014-04-28 – 2014-05-10 (×3): 30 mL via ORAL
  Filled 2014-04-28 (×4): qty 30

## 2014-04-28 MED ORDER — ASPIRIN EC 81 MG PO TBEC
81.0000 mg | DELAYED_RELEASE_TABLET | Freq: Every day | ORAL | Status: DC
  Administered 2014-04-29 – 2014-05-15 (×17): 81 mg via ORAL
  Filled 2014-04-28 (×18): qty 1

## 2014-04-28 MED ORDER — DOCUSATE SODIUM 100 MG PO CAPS
100.0000 mg | ORAL_CAPSULE | Freq: Two times a day (BID) | ORAL | Status: DC
  Administered 2014-04-28 – 2014-05-15 (×34): 100 mg via ORAL
  Filled 2014-04-28 (×38): qty 1

## 2014-04-28 MED ORDER — CLOPIDOGREL BISULFATE 75 MG PO TABS
75.0000 mg | ORAL_TABLET | Freq: Every day | ORAL | Status: DC
  Administered 2014-04-29 – 2014-05-15 (×17): 75 mg via ORAL
  Filled 2014-04-28 (×18): qty 1

## 2014-04-28 MED ORDER — ONDANSETRON HCL 4 MG PO TABS: 4.0000 mg | ORAL_TABLET | Freq: Four times a day (QID) | ORAL | Status: DC | PRN

## 2014-04-28 MED ORDER — CLOPIDOGREL BISULFATE 75 MG PO TABS
75.0000 mg | ORAL_TABLET | Freq: Every day | ORAL | Status: DC
  Administered 2014-04-28: 75 mg via ORAL
  Filled 2014-04-28: qty 1

## 2014-04-28 NOTE — H&P (Signed)
Physical Medicine and Rehabilitation Admission H&P    Chief Complaint  Patient presents with  . Hematuria  . 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 Patient lives alone independent with a cane prior to admission. Admitted 04/26/2014 and altered mental status.  MRI with acute nonhemorrhagic left ACA territory infarct as well as remote right parietal lobe infarct. MRA of the head with occlusion of the internal carotid artery with reconstitution at the level the posterior to indicating artery.echo with EF 55% without embolism.Carotid doppler without ICA stenosis.Cardiac enzymes negative.Neurology consulted placed on plavix/aspirin for CVA prophylaxis and subcutaneous Lovenox for DVT prophylaxis. Patient is maintained on a mechanical soft diet. Physical occupational therapy evaluations completed 04/27/2014 recommendations of physical medicine rehabilitation consult. Admitted for comprehensive rehabilitation program   ROS Review of Systems  Gastrointestinal:  GERD  Genitourinary: Positive for urgency.  Musculoskeletal: Positive for falls.  Neurological: Positive for weakness.  All other systems reviewed and are negative  Past Medical History  Diagnosis Date  . Hypertension   . Hyperlipidemia   . Type 2 diabetes mellitus   . History of myocardial infarction     1993--  NON-Q WAVE  S/P PCI TO LAD  . History of CVA (cerebrovascular accident)     1993---  NO RESIDUALS  . Hydrocele, left   . BPH (benign prostatic hypertrophy)     HX RETENTION  . PSVT (paroxysmal supraventricular tachycardia)   . Coronary artery disease CARDIOLOGIST--  DR Johney Frame    S/P  PCI TO LAD 1993  . Carotid artery occlusion     S/P RIGHT CEA  . PVD (peripheral  vascular disease)     S/P LEFT FEM-POP  . Left carotid artery stenosis     MILD  . History of head injury     CONCUSSION--  NO RESIDUAL  . Right leg weakness     USES CANE--  SECONDARY TO PVD  . At high risk for falls   . Wears glasses   . Myocardial infarction   . GERD (gastroesophageal reflux disease)    Past Surgical History  Procedure Laterality Date  . Carotid endarterectomy Right 05-31-2003  . Femoral-popliteal bypass graft Left 01-19-2003  . Right hydrocelectomy  05-31-2003  . Coronary angioplasty  1993    PCI TO LAD  . Cardiac catheterization  01-18-2003   DR NISHAN    NON-OBSTRUCTIVE CAD/  PREVIOIS ANGIOPLASTY SITE WIDELY PATENT  . Cardiovascular stress test  2007    SMALL ANTERO-APICAL SCAR/  NO ISCHEMIA  . Transthoracic echocardiogram  08-24-2010    EF 55%/  MILD AORTIC INSUFFICENCY/  MODERATE LVH  . Hydrocele excision Left 10/27/2013    Procedure: HYDROCELECTOMY ADULT;  Surgeon: Lindaann Slough, MD;  Location: [facility];  Service: Urology;  Laterality: Left;   Family History  Problem Relation Age of Onset  . Stroke Mother   . Hypertension Mother   . Hypertension Father    Social History:  reports that he quit smoking about 20 years ago. His smoking use included Cigarettes. He smoked 0.00 packs per day for 0 years. He has never used smokeless tobacco. He reports that he does not drink alcohol or use illicit drugs. Allergies: No Known Allergies Medications Prior to Admission  Medication Sig Dispense Refill  . aspirin 325 MG EC tablet Take 325 mg by mouth daily.      Marland Kitchen. diltiazem (TAZTIA XT) 360 MG 24 hr capsule Take 360 mg by mouth daily.      Marland Kitchen. docusate sodium (COLACE) 100 MG capsule Take 100 mg by mouth 2 (two) times daily.      Marland Kitchen. doxazosin (CARDURA) 2 MG tablet Take 2 mg by mouth daily.      Marland Kitchen. glipiZIDE (GLUCOTROL XL) 5 MG 24 hr tablet Take 5 mg by mouth daily.      . metFORMIN (GLUCOPHAGE) 1000 MG tablet Take 1,000 mg by mouth 2 (two) times  daily with a meal.      . metoprolol (LOPRESSOR) 50 MG tablet Take 1 tablet (50 mg total) by mouth 2 (two) times daily.  60 tablet  3  . rosuvastatin (CRESTOR) 10 MG tablet Take 10 mg by mouth daily.        Home: Home Living Family/patient expects to be discharged to:: Inpatient rehab Living Arrangements: Alone Available Help at Discharge: Family;Available 24 hours/day Type of Home: Apartment Home Access: Level entry Home Layout: One level Home Equipment: None  Lives With: Alone   Functional History: Prior Function Level of Independence: Independent with assistive device(s) Comments: Pt does not drive - reports dtr drives him  Functional Status:  Mobility: Bed Mobility Overal bed mobility: Needs Assistance Bed Mobility: Supine to Sit;Sit to Supine Rolling: Min assist Supine to sit: Total assist Sit to supine: Max assist General bed mobility comments: Pt required assist with all aspects of moving sit to supine and supine to sit.  Transfers Overall transfer level: Needs assistance Transfers: Sit to/from Stand Sit to Stand: Total assist (unable to achieve with +1 assist)      ADL: ADL Overall ADL's : Needs assistance/impaired Eating/Feeding: Moderate assistance;Bed level Grooming: Wash/dry hands;Wash/dry face;Moderate assistance;Bed level Upper Body Bathing: Moderate assistance;Bed level Lower Body Bathing: Total assistance;Bed level Upper Body Dressing : Total assistance;Bed level Lower Body Dressing: Total assistance;Bed level Toilet Transfer: Total assistance (unable) Toileting- Clothing Manipulation and Hygiene: Total assistance;Bed level Functional mobility during ADLs: Maximal assistance (to move and to sit EOB) General ADL Comments: Pt required max A to sit EOB. Pt with flat affect and only minimally particiaptory.  Pt's daughter and granddaughter present - unsure if pt self conscious and not engaging unless max prompts provided.  RN reports pt's affect changed  significantly after PT - questionable depression after realizing extent of deficits.   Cognition: Cognition Overall Cognitive Status: Impaired/Different from baseline Arousal/Alertness: Awake/alert Orientation Level: Oriented X4 Attention: Focused;Sustained Focused Attention: Appears intact Sustained Attention: Impaired Sustained Attention Impairment: Verbal complex;Functional complex Memory: Impaired Memory Impairment: Storage deficit;Retrieval deficit Awareness: Impaired Awareness Impairment: Intellectual impairment;Emergent impairment;Anticipatory impairment Problem Solving: Impaired Problem Solving Impairment: Verbal basic;Functional basic Executive Function: Reasoning Behaviors:  (flat affect) Safety/Judgment: Impaired Cognition Arousal/Alertness: Awake/alert Behavior During Therapy: Flat affect Overall Cognitive Status: Impaired/Different from baseline Area of Impairment: Attention;Memory;Following commands;Safety/judgement;Awareness;Problem solving Orientation Level: Time Current Attention Level: Sustained Memory: Decreased short-term memory Following Commands: Follows one step commands with increased time Safety/Judgement: Decreased awareness of safety;Decreased awareness of deficits Awareness: Intellectual Problem Solving: Slow processing;Decreased initiation;Difficulty sequencing;Requires verbal cues;Requires tactile cues General Comments: Pt with delayed processing.  Pt with very little output with OT - question if due to embarrassment with family in room.  RN reports pt's demeanor changed after PT  Physical Exam: Blood pressure 150/55, pulse 69, temperature 98 F (36.7 C), temperature  source Oral, resp. rate 16, weight 106.686 kg (235 lb 3.2 oz), SpO2 95.00%. Physical Exam Gen: appears fatigued, obese HENT: dentition fair, oral mucosa pink Head: Normocephalic.  Eyes:  Pupils reactive to light.  Neck: Normal range of motion. Neck supple. No thyromegaly present.    Cardiovascular: Normal rate and regular rhythm. No murmurs Respiratory: Effort normal and breath sounds normal. No respiratory distress. No wheezes GI: Soft. Bowel sounds are normal. He exhibits no distension.  Neurological:  Flat affect.Oriented to person,place,age and DOB.Follows simple commands. limited awareness and insight. Has difficulty maintaining attention and focus. Falls asleep. MMT: 5/5 bilateral deltoid, bicep, tricep, grip  Minimal dysmetria bilaterally finger-nose-finger  Right lower extremity 3+/5 hip flexor, 4/5 knee extensor ankle dorsi flexion plantar flexor  Left lower extremity 4/5 in the left hip flexor knee extensor 2-inus ankle dorsiflexor plantar flexor  Sensation intact to light touch R. upper and lower limbs  DTR's 1+, no resting tone.  Psych: flat  Results for orders placed during the hospital encounter of 04/26/14 (from the past 48 hour(s))  GLUCOSE, CAPILLARY     Status: Abnormal   Collection Time    04/26/14  4:47 PM      Result Value Ref Range   Glucose-Capillary 123 (*) 70 - 99 mg/dL  GLUCOSE, CAPILLARY     Status: Abnormal   Collection Time    04/26/14 11:23 PM      Result Value Ref Range   Glucose-Capillary 142 (*) 70 - 99 mg/dL  HEMOGLOBIN [id number]     Status: Abnormal   Collection Time    04/27/14  6:43 AM      Result Value Ref Range   Hemoglobin A1C 5.7 (*) <5.7 %   Comment: (NOTE)                                                                               According to the ADA Clinical Practice Recommendations for 2011, when     HbA1c is used as a screening test:      >=6.5%   Diagnostic of Diabetes Mellitus               (if abnormal result is confirmed)     5.7-6.4%   Increased risk of developing Diabetes Mellitus     References:Diagnosis and Classification of Diabetes Mellitus,Diabetes     Care,2011,34(Suppl 1):S62-S69 and Standards of Medical Care in             Diabetes - 2011,Diabetes Care,2011,34 (Suppl 1):S11-S61.   Mean Plasma  Glucose 117 (*) <117 mg/dL   Comment: Performed at [facility]  LIPID PANEL     Status: Abnormal   Collection Time    04/27/14  6:43 AM      Result Value Ref Range   Cholesterol 112  0 - 200 mg/dL   Triglycerides 096  <045 mg/dL   HDL 38 (*) >40 mg/dL   Total CHOL/HDL Ratio 2.9     VLDL 21  0 - 40 mg/dL   LDL Cholesterol 53  0 - 99 mg/dL   Comment:            Total Cholesterol/HDL:CHD Risk     Coronary  Heart Disease Risk Table                         Men   Women      1/2 Average Risk   3.4   3.3      Average Risk       5.0   4.4      2 X Average Risk   9.6   7.1      3 X Average Risk  23.4   11.0                Use the calculated Patient Ratio     above and the CHD Risk Table     to determine the patient's CHD Risk.                ATP III CLASSIFICATION (LDL):      <100     mg/dL   Optimal      161-096  mg/dL   Near or Above                        Optimal      130-159  mg/dL   Borderline      045-409  mg/dL   High      >811     mg/dL   Very High  GLUCOSE, CAPILLARY     Status: None   Collection Time    04/27/14  7:44 AM      Result Value Ref Range   Glucose-Capillary 87  70 - 99 mg/dL   Comment 1 Documented in Chart     Comment 2 Notify RN    GLUCOSE, CAPILLARY     Status: Abnormal   Collection Time    04/27/14 11:46 AM      Result Value Ref Range   Glucose-Capillary 105 (*) 70 - 99 mg/dL   Comment 1 Documented in Chart     Comment 2 Notify RN    GLUCOSE, CAPILLARY     Status: Abnormal   Collection Time    04/27/14  4:38 PM      Result Value Ref Range   Glucose-Capillary 185 (*) 70 - 99 mg/dL  GLUCOSE, CAPILLARY     Status: Abnormal   Collection Time    04/27/14  9:24 PM      Result Value Ref Range   Glucose-Capillary 58 (*) 70 - 99 mg/dL  GLUCOSE, CAPILLARY     Status: Abnormal   Collection Time    04/27/14  9:46 PM      Result Value Ref Range   Glucose-Capillary 108 (*) 70 - 99 mg/dL  GLUCOSE, CAPILLARY     Status: None   Collection Time     04/28/14  6:46 AM      Result Value Ref Range   Glucose-Capillary 92  70 - 99 mg/dL  GLUCOSE, CAPILLARY     Status: None   Collection Time    04/28/14  7:14 AM      Result Value Ref Range   Glucose-Capillary 97  70 - 99 mg/dL  GLUCOSE, CAPILLARY     Status: Abnormal   Collection Time    04/28/14 11:21 AM      Result Value Ref Range   Glucose-Capillary 178 (*) 70 - 99 mg/dL   No results found.     Medical Problem List and Plan: 1. Functional deficits secondary to left ACA distribution infarct suspect  embolic secondary to unclear source 2.  DVT Prophylaxis/Anticoagulation: Subcutaneous Lovenox. Monitor platelet counts and any signs of bleeding 3. Pain Management: Tylenol as needed 4. Hypertension. Cardizem 360 mg daily, Cardura 2 mg daily, Lopressor 50 mg twice a day. Monitor with increased mobility 5. Neuropsych: This patient is not yet capable of making decisions on his own behalf. 6. Diabetes mellitus with peripheral neuropathy. Hemoglobin A1c 5.7. Glipizide 5 mg daily. Check blood sugars a.c. and at bedtime. Provide diabetic teaching. 7. Hyperlipidemia. Lipitor 8. CAD with stenting. Cardiac enzymes negative. Continue aspirin Plavix therapy. No chest pain or shortness of breath. 9. History of BPH. Check PVRs x3  Post Admission Physician Evaluation: 1. Functional deficits secondary  to embolic left ACA infarct. 2. Patient is admitted to receive collaborative, interdisciplinary care between the physiatrist, rehab nursing staff, and therapy team. 3. Patient's level of medical complexity and substantial therapy needs in context of that medical necessity cannot be provided at a lesser intensity of care such as a SNF. 4. Patient has experienced substantial functional loss from his/her baseline which was documented above under the "Functional History" and "Functional Status" headings.  Judging by the patient's diagnosis, physical exam, and functional history, the patient has potential for  functional progress which will result in measurable gains while on inpatient rehab.  These gains will be of substantial and practical use upon discharge  in facilitating mobility and self-care at the household level. 5. Physiatrist will provide 24 hour management of medical needs as well as oversight of the therapy plan/treatment and provide guidance as appropriate regarding the interaction of the two. 6. 24 hour rehab nursing will assist with bladder management, bowel management, safety, skin/wound care, disease management, medication administration, pain management and patient education  and help integrate therapy concepts, techniques,education, etc. 7. PT will assess and treat for/with: Lower extremity strength, range of motion, stamina, balance, functional mobility, safety, adaptive techniques and equipment, NMR, orthotics, stroke education, caregiver training, egosupport.   Goals are: supervision to min assist. 8. OT will assess and treat for/with: ADL's, functional mobility, safety, upper extremity strength, adaptive techniques and equipment, NMR, stroke education, ego-support, caregiver training.   Goals are: supervision to min assist. 9. SLP will assess and treat for/with: communication, cognition, swallowing.  Goals are: supervision to mod I. 10. Case Management and Social Worker will assess and treat for psychological issues and discharge planning. 11. Team conference will be held weekly to assess progress toward goals and to determine barriers to discharge. 12. Patient will receive at least 3 hours of therapy per day at least 5 days per week. 13. ELOS: 15-20 days       14. Prognosis:  excellent     Ranelle OysterZachary T. Laquia Rosano, MD, [facility] IncFAAPMR [place] [facility]   04/28/2014

## 2014-04-28 NOTE — Progress Notes (Signed)
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 with reconstitution at the level of PCA, patient had a history of right carotid endarterectomy more than 10 years,  - 2-D echo showed mild to moderate aortic stenosis no cardiac source of embolism, EF 50-55% - Carotid Dopplers showed 1-39% stenosis involving right ICA and left ICA - EEG negative for seizures - PT OT recommended CIR - Currently tolerating dysphagia 3 diet -Currently on aspirin and Plavix, stroke service following, we'll follow recommendations  - Continue Lipitor  Active Problems:     HYPERLIPIDEMIA-MIXED -LDL 53, on Lipitor     HYPERTENSION, UNSPECIFIED -Stable     CAD, NATIVE VESSEL - Continue aspirin, Plavix, statin, Cardizem     Abdominal pain - CT abdomen and pelvis -  showed no acute findings, colonic diverticulosis, small umbilical hernia  -UA negative for any UTI   DIABETES mellitus  -CBG stable   DVT Prophylaxis: Lovenox   Code Status:  Family Communication: discussed with the patient   Disposition: pending CIR   Consultants:  Neurology    CIR  Procedures:  None   Antibiotics:  None     Subjective: Patient seen and examined, denies any specific complaints, no chest pain, shortness of breath, fevers or chills or abdominal pain   Objective: Weight change:   Intake/Output Summary (Last 24 hours) at 04/28/14 1409 Last data filed at 04/28/14 1328  Gross per 24 hour  Intake    730 ml  Output   1775 ml  Net  -1045 ml   Blood pressure 150/55, pulse 69, temperature 98 F (36.7 C), temperature source Oral, resp. rate 16, weight 106.686 kg (235 lb 3.2 oz), SpO2 95.00%.  Physical  Exam: General: Alert and awake, oriented x3, not in any acute distress. CVS: S1-S2 clear, no murmur rubs or gallops Chest: clear to auscultation bilaterally, no wheezing, rales or rhonchi Abdomen: soft nontender, nondistended, normal bowel sounds  Extremities: no cyanosis, clubbing or edema noted bilaterally   Lab Results: Basic Metabolic Panel:  Recent Labs Lab 04/26/14 0350 04/26/14 0935  NA 140 140  K 3.8 3.9  CL 102 102  CO2 25 24  GLUCOSE 108* 93  BUN 8 7  CREATININE 0.92 0.97  CALCIUM 9.4 9.2   Liver Function Tests:  Recent Labs Lab 04/26/14 0350 04/26/14 0935  AST 28 30  ALT 28 28  ALKPHOS 77 80  BILITOT 0.7 0.8  PROT 7.3 6.9  ALBUMIN 3.9 3.8   No results found for this basename: LIPASE, AMYLASE,  in the last 168 hours No results found for this basename: AMMONIA,  in the last 168 hours CBC:  Recent Labs Lab 04/26/14 0350 04/26/14 0935  WBC 7.5 7.9  NEUTROABS  --  5.0  HGB 13.8 14.1  HCT 39.9 41.5  MCV 95.2 97.6  PLT 361 329   Cardiac Enzymes:  Recent Labs Lab 04/26/14 0935  CKTOTAL 487*  TROPONINI <0.30   BNP: No components found with this basename: POCBNP,  CBG:  Recent Labs Lab 04/27/14 2124 04/27/14 2146 04/28/14 0646 04/28/14 0714 04/28/14 1121  GLUCAP 58* 108* 92 97 178*     Micro Results: No results found for  this or any previous visit (from the past 240 hour(s)).  Studies/Results: Ct Abdomen Pelvis Wo Contrast  04/26/2014   CLINICAL DATA:  Abdominal pain.  Evaluate for hydronephrosis.  EXAM: CT ABDOMEN AND PELVIS WITHOUT CONTRAST  TECHNIQUE: Multidetector CT imaging of the abdomen and pelvis was performed following the standard protocol without IV contrast.  COMPARISON:  No priors.  FINDINGS: Lung Bases: Calcifications of the aortic valve. Atherosclerotic calcifications in the right coronary artery.  Abdomen/Pelvis: No abnormal calcifications are identified within the collecting system of either kidney, along the course of  either ureter, or within the lumen of the urinary bladder. No hydroureteronephrosis to suggest urinary tract obstruction at this time. The unenhanced appearance of the kidneys is unremarkable bilaterally.  The unenhanced appearance of the liver, gallbladder, pancreas, spleen and bilateral adrenal glands is unremarkable. Atherosclerosis throughout the abdominal and pelvic vasculature, without evidence of aneurysm. Small umbilical hernia containing only omental fat incidentally noted. No evidence of bowel incarceration or obstruction at this time. No significant volume of ascites. No pneumoperitoneum. Numerous colonic diverticulae are noted, most pronounced in the descending and sigmoid colon, without surrounding inflammatory changes to suggest an acute diverticulitis at this time. Normal appendix. Prostate gland and urinary bladder are unremarkable in appearance.  Musculoskeletal: There are no aggressive appearing lytic or blastic lesions noted in the visualized portions of the skeleton.  IMPRESSION: 1. No acute findings in the abdomen or pelvis. 2. Specifically, no abnormal urinary tract calculi or findings of urinary tract obstruction at this time. 3. Colonic diverticulosis without findings to suggest acute diverticulitis at this time. 4. Normal appendix. 5. Small umbilical hernia containing a small amount of omental fat. No associated bowel incarceration or obstruction at this time. 6. Atherosclerosis, including right coronary artery disease. Please note that although the presence of coronary artery calcium documents the presence of coronary artery disease, the severity of this disease and any potential stenosis cannot be assessed on this non-gated CT examination. Assessment for potential risk factor modification, dietary therapy or pharmacologic therapy may be warranted, if clinically indicated. 7. There are calcifications of the aortic valve. Echocardiographic correlation for evaluation of potential valvular  dysfunction may be warranted if clinically indicated.   Electronically Signed   By: Trudie Reedaniel  Entrikin M.D.   On: 04/26/2014 06:37   Dg Chest 2 View  04/26/2014   CLINICAL DATA:  Stroke.  EXAM: CHEST  2 VIEW  COMPARISON:  No prior.  FINDINGS: Bilateral pulmonary infiltrates are present. Differential diagnosis includes pulmonary edema and/or pneumonia. Borderline cardiomegaly with mild pulmonary vascular prominence. No pleural effusion or pneumothorax.  IMPRESSION: Bilateral pulmonary infiltrates. Differential diagnosis includes pulmonary edema and/or node bilateral pneumonia.   Electronically Signed   By: Maisie Fushomas  Register   On: 04/26/2014 12:13   Ct Head Wo Contrast  04/26/2014   CLINICAL DATA:  Weakness.  EXAM: CT HEAD WITHOUT CONTRAST  TECHNIQUE: Contiguous axial images were obtained from the base of the skull through the vertex without intravenous contrast.  COMPARISON:  CT NECK W/CM dated 07/03/2010; CT HEAD W/O CM dated 04/02/2009  FINDINGS: The ventricles and sulci are normal for age. No intraparenchymal hemorrhage, mass effect nor midline shift. Patchy supratentorial white matter hypodensities are within normal range for patient's age and though non-specific suggest sequelae of chronic small vessel ischemic disease. Focal loss of the left medial frontal/ cingulate gray-white matter differentiation (axial 20/31). Right frontoparietal cystic encephalomalacia with mild ex vacuo dilatation of the subjacent ventricle.  No abnormal extra-axial fluid collections. Basal  cisterns are patent. Moderate calcific atherosclerosis of the carotid siphons.  No skull fracture. The included ocular globes and orbital contents are non-suspicious. Mild paranasal sinus mucosal thickening without air-fluid levels.  IMPRESSION: Focal loss of the left medial frontal gray white matter differentiation concerning for acute to subacute ischemia, left anterior cerebral artery territory. MRI of the brain with diffusion-weighted sequences  would be more sensitive.  Remote right frontoparietal infarcts.  Findings discussed with and reconfirmed by Dr.COURTNEY, HORTON on5/18/2015at4:39 AM.   Electronically Signed   By: Awilda Metroourtnay  Bloomer   On: 04/26/2014 04:40   Mr Brain Wo Contrast  04/26/2014   CLINICAL DATA:  Loss of consciousness. Stroke. Chronic left lower extremity weakness. Left upper extremity weakness and and  EXAM: MRI HEAD WITHOUT CONTRAST  MRA HEAD WITHOUT CONTRAST  TECHNIQUE: Multiplanar, multiecho pulse sequences of the brain and surrounding structures were obtained without intravenous contrast. Angiographic images of the head were obtained using MRA technique without contrast.  COMPARISON:  CT head from the same day.  FINDINGS: MRI HEAD FINDINGS  The diffusion-weighted images demonstrate an acute nonhemorrhagic infarct within the left ACA territory. No hemorrhage or mass lesion is present.  A remote right parietal lobe infarct is evident. Mild generalized atrophy is noted. There is ex vacuo dilation of the right lateral ventricle compatible with the prior infarct.  Right ICA and MCA conclusion is noted.  MRA HEAD FINDINGS  The MRA study is moderately degraded by patient motion. The right internal carotid artery is occluded. The left internal carotid artery is unremarkable. There is mild irregularity in the left A1 and M1 segments, likely exaggerated by patient motion. There is some flow in the right A1 segment. Terminal right ICA is reconstituted at the level of the right posterior communicating artery although no dominant artery is seen.  The right vertebral artery is dominant to the left. The basilar artery is within normal limits. There is moderate signal loss at the origin of the left posterior cerebral artery. Both posterior cerebral arteries originate from the basilar tip.  IMPRESSION: 1. Acute nonhemorrhagic left ACA territory infarct. 2. Remote right parietal lobe infarct. 3. Occlusion of the right internal carotid artery with  reconstitution at the level of the posterior communicating artery. These results will be called to the ordering clinician or representative by the Radiologist Assistant, and communication documented in the PACS or zVision Dashboard.   Electronically Signed   By: Gennette Pachris  Mattern M.D.   On: 04/26/2014 11:46   Mr Maxine GlennMra Head/brain Wo Cm  04/26/2014   CLINICAL DATA:  Loss of consciousness. Stroke. Chronic left lower extremity weakness. Left upper extremity weakness and and  EXAM: MRI HEAD WITHOUT CONTRAST  MRA HEAD WITHOUT CONTRAST  TECHNIQUE: Multiplanar, multiecho pulse sequences of the brain and surrounding structures were obtained without intravenous contrast. Angiographic images of the head were obtained using MRA technique without contrast.  COMPARISON:  CT head from the same day.  FINDINGS: MRI HEAD FINDINGS  The diffusion-weighted images demonstrate an acute nonhemorrhagic infarct within the left ACA territory. No hemorrhage or mass lesion is present.  A remote right parietal lobe infarct is evident. Mild generalized atrophy is noted. There is ex vacuo dilation of the right lateral ventricle compatible with the prior infarct.  Right ICA and MCA conclusion is noted.  MRA HEAD FINDINGS  The MRA study is moderately degraded by patient motion. The right internal carotid artery is occluded. The left internal carotid artery is unremarkable. There is mild irregularity in the  left A1 and M1 segments, likely exaggerated by patient motion. There is some flow in the right A1 segment. Terminal right ICA is reconstituted at the level of the right posterior communicating artery although no dominant artery is seen.  The right vertebral artery is dominant to the left. The basilar artery is within normal limits. There is moderate signal loss at the origin of the left posterior cerebral artery. Both posterior cerebral arteries originate from the basilar tip.  IMPRESSION: 1. Acute nonhemorrhagic left ACA territory infarct. 2.  Remote right parietal lobe infarct. 3. 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 These results will be called to the ordering clinician or representative by the Radiologist Assistant, and communication documented in the PACS or zVision Dashboard.   Electronically Signed   By: Gennette Pac M.D.   On: 04/26/2014 11:46    Medications: Scheduled Meds: . aspirin EC  81 mg Oral Daily  . atorvastatin  10 mg Oral q1800  . bisacodyl  10 mg Rectal Once  . clopidogrel  75 mg Oral Q breakfast  . diltiazem  360 mg Oral Daily  . docusate sodium  100 mg Oral BID  . doxazosin  2 mg Oral Daily  . enoxaparin (LOVENOX) injection  40 mg Subcutaneous Q24H  . glipiZIDE  5 mg Oral Daily  . insulin aspart  0-9 Units Subcutaneous TID WC  . metoprolol  50 mg Oral BID      LOS: 2 days   Tori Dattilio Jenna Luo M.D. [facility] 04/28/2014, 2:09 PM Pager: [phone]  If 7PM-7AM, please contact night-coverage [email or web address] Password [email or web address]  **Disclaimer: This note was dictated with voice recognition software. Similar sounding words can inadvertently be transcribed and this note may contain transcription errors which may not have been corrected upon publication of note.**

## 2014-04-28 NOTE — H&P (View-Only) (Signed)
Physical Medicine and Rehabilitation Admission H&P    Chief Complaint  Patient presents with  . Hematuria  . 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 Patient lives alone independent with a cane prior to admission. Admitted 04/26/2014 and altered mental status.  MRI with acute nonhemorrhagic left ACA territory infarct as well as remote right parietal lobe infarct. MRA of the head with occlusion of the internal carotid artery with reconstitution at the level the posterior to indicating artery.echo with EF 55% without embolism.Carotid doppler without ICA stenosis.Cardiac enzymes negative.Neurology consulted placed on plavix/aspirin for CVA prophylaxis and subcutaneous Lovenox for DVT prophylaxis. Patient is maintained on a mechanical soft diet. Physical occupational therapy evaluations completed 04/27/2014 recommendations of physical medicine rehabilitation consult. Admitted for comprehensive rehabilitation program   ROS Review of Systems  Gastrointestinal:  GERD  Genitourinary: Positive for urgency.  Musculoskeletal: Positive for falls.  Neurological: Positive for weakness.  All other systems reviewed and are negative  Past Medical History  Diagnosis Date  . Hypertension   . Hyperlipidemia   . Type 2 diabetes mellitus   . History of myocardial infarction     1993--  NON-Q WAVE  S/P PCI TO LAD  . History of CVA (cerebrovascular accident)     1993---  NO RESIDUALS  . Hydrocele, left   . BPH (benign prostatic hypertrophy)     HX RETENTION  . PSVT (paroxysmal supraventricular tachycardia)   . Coronary artery disease CARDIOLOGIST--  DR Johney Frame    S/P  PCI TO LAD 1993  . Carotid artery occlusion     S/P RIGHT CEA  . PVD (peripheral  vascular disease)     S/P LEFT FEM-POP  . Left carotid artery stenosis     MILD  . History of head injury     CONCUSSION--  NO RESIDUAL  . Right leg weakness     USES CANE--  SECONDARY TO PVD  . At high risk for falls   . Wears glasses   . Myocardial infarction   . GERD (gastroesophageal reflux disease)    Past Surgical History  Procedure Laterality Date  . Carotid endarterectomy Right 05-31-2003  . Femoral-popliteal bypass graft Left 01-19-2003  . Right hydrocelectomy  05-31-2003  . Coronary angioplasty  1993    PCI TO LAD  . Cardiac catheterization  01-18-2003   DR NISHAN    NON-OBSTRUCTIVE CAD/  PREVIOIS ANGIOPLASTY SITE WIDELY PATENT  . Cardiovascular stress test  2007    SMALL ANTERO-APICAL SCAR/  NO ISCHEMIA  . Transthoracic echocardiogram  08-24-2010    EF 55%/  MILD AORTIC INSUFFICENCY/  MODERATE LVH  . Hydrocele excision Left 10/27/2013    Procedure: HYDROCELECTOMY ADULT;  Surgeon: Lindaann Slough, MD;  Location: [facility];  Service: Urology;  Laterality: Left;   Family History  Problem Relation Age of Onset  . Stroke Mother   . Hypertension Mother   . Hypertension Father    Social History:  reports that he quit smoking about 20 years ago. His smoking use included Cigarettes. He smoked 0.00 packs per day for 0 years. He has never used smokeless tobacco. He reports that he does not drink alcohol or use illicit drugs. Allergies: No Known Allergies Medications Prior to Admission  Medication Sig Dispense Refill  . aspirin 325 MG EC tablet Take 325 mg by mouth daily.      Marland Kitchen. diltiazem (TAZTIA XT) 360 MG 24 hr capsule Take 360 mg by mouth daily.      Marland Kitchen. docusate sodium (COLACE) 100 MG capsule Take 100 mg by mouth 2 (two) times daily.      Marland Kitchen. doxazosin (CARDURA) 2 MG tablet Take 2 mg by mouth daily.      Marland Kitchen. glipiZIDE (GLUCOTROL XL) 5 MG 24 hr tablet Take 5 mg by mouth daily.      . metFORMIN (GLUCOPHAGE) 1000 MG tablet Take 1,000 mg by mouth 2 (two) times  daily with a meal.      . metoprolol (LOPRESSOR) 50 MG tablet Take 1 tablet (50 mg total) by mouth 2 (two) times daily.  60 tablet  3  . rosuvastatin (CRESTOR) 10 MG tablet Take 10 mg by mouth daily.        Home: Home Living Family/patient expects to be discharged to:: Inpatient rehab Living Arrangements: Alone Available Help at Discharge: Family;Available 24 hours/day Type of Home: Apartment Home Access: Level entry Home Layout: One level Home Equipment: None  Lives With: Alone   Functional History: Prior Function Level of Independence: Independent with assistive device(s) Comments: Pt does not drive - reports dtr drives him  Functional Status:  Mobility: Bed Mobility Overal bed mobility: Needs Assistance Bed Mobility: Supine to Sit;Sit to Supine Rolling: Min assist Supine to sit: Total assist Sit to supine: Max assist General bed mobility comments: Pt required assist with all aspects of moving sit to supine and supine to sit.  Transfers Overall transfer level: Needs assistance Transfers: Sit to/from Stand Sit to Stand: Total assist (unable to achieve with +1 assist)      ADL: ADL Overall ADL's : Needs assistance/impaired Eating/Feeding: Moderate assistance;Bed level Grooming: Wash/dry hands;Wash/dry face;Moderate assistance;Bed level Upper Body Bathing: Moderate assistance;Bed level Lower Body Bathing: Total assistance;Bed level Upper Body Dressing : Total assistance;Bed level Lower Body Dressing: Total assistance;Bed level Toilet Transfer: Total assistance (unable) Toileting- Clothing Manipulation and Hygiene: Total assistance;Bed level Functional mobility during ADLs: Maximal assistance (to move and to sit EOB) General ADL Comments: Pt required max A to sit EOB. Pt with flat affect and only minimally particiaptory.  Pt's daughter and granddaughter present - unsure if pt self conscious and not engaging unless max prompts provided.  RN reports pt's affect changed  significantly after PT - questionable depression after realizing extent of deficits.   Cognition: Cognition Overall Cognitive Status: Impaired/Different from baseline Arousal/Alertness: Awake/alert Orientation Level: Oriented X4 Attention: Focused;Sustained Focused Attention: Appears intact Sustained Attention: Impaired Sustained Attention Impairment: Verbal complex;Functional complex Memory: Impaired Memory Impairment: Storage deficit;Retrieval deficit Awareness: Impaired Awareness Impairment: Intellectual impairment;Emergent impairment;Anticipatory impairment Problem Solving: Impaired Problem Solving Impairment: Verbal basic;Functional basic Executive Function: Reasoning Behaviors:  (flat affect) Safety/Judgment: Impaired Cognition Arousal/Alertness: Awake/alert Behavior During Therapy: Flat affect Overall Cognitive Status: Impaired/Different from baseline Area of Impairment: Attention;Memory;Following commands;Safety/judgement;Awareness;Problem solving Orientation Level: Time Current Attention Level: Sustained Memory: Decreased short-term memory Following Commands: Follows one step commands with increased time Safety/Judgement: Decreased awareness of safety;Decreased awareness of deficits Awareness: Intellectual Problem Solving: Slow processing;Decreased initiation;Difficulty sequencing;Requires verbal cues;Requires tactile cues General Comments: Pt with delayed processing.  Pt with very little output with OT - question if due to embarrassment with family in room.  RN reports pt's demeanor changed after PT  Physical Exam: Blood pressure 150/55, pulse 69, temperature 98 F (36.7 C), temperature  source Oral, resp. rate 16, weight 106.686 kg (235 lb 3.2 oz), SpO2 95.00%. Physical Exam Gen: appears fatigued, obese HENT: dentition fair, oral mucosa pink Head: Normocephalic.  Eyes:  Pupils reactive to light.  Neck: Normal range of motion. Neck supple. No thyromegaly present.    Cardiovascular: Normal rate and regular rhythm. No murmurs Respiratory: Effort normal and breath sounds normal. No respiratory distress. No wheezes GI: Soft. Bowel sounds are normal. He exhibits no distension.  Neurological:  Flat affect.Oriented to person,place,age and DOB.Follows simple commands. limited awareness and insight. Has difficulty maintaining attention and focus. Falls asleep. MMT: 5/5 bilateral deltoid, bicep, tricep, grip  Minimal dysmetria bilaterally finger-nose-finger  Right lower extremity 3+/5 hip flexor, 4/5 knee extensor ankle dorsi flexion plantar flexor  Left lower extremity 4/5 in the left hip flexor knee extensor 2-inus ankle dorsiflexor plantar flexor  Sensation intact to light touch R. upper and lower limbs  DTR's 1+, no resting tone.  Psych: flat  Results for orders placed during the hospital encounter of 04/26/14 (from the past 48 hour(s))  GLUCOSE, CAPILLARY     Status: Abnormal   Collection Time    04/26/14  4:47 PM      Result Value Ref Range   Glucose-Capillary 123 (*) 70 - 99 mg/dL  GLUCOSE, CAPILLARY     Status: Abnormal   Collection Time    04/26/14 11:23 PM      Result Value Ref Range   Glucose-Capillary 142 (*) 70 - 99 mg/dL  HEMOGLOBIN [id number]     Status: Abnormal   Collection Time    04/27/14  6:43 AM      Result Value Ref Range   Hemoglobin A1C 5.7 (*) <5.7 %   Comment: (NOTE)                                                                               According to the ADA Clinical Practice Recommendations for 2011, when     HbA1c is used as a screening test:      >=6.5%   Diagnostic of Diabetes Mellitus               (if abnormal result is confirmed)     5.7-6.4%   Increased risk of developing Diabetes Mellitus     References:Diagnosis and Classification of Diabetes Mellitus,Diabetes     Care,2011,34(Suppl 1):S62-S69 and Standards of Medical Care in             Diabetes - 2011,Diabetes Care,2011,34 (Suppl 1):S11-S61.   Mean Plasma  Glucose 117 (*) <117 mg/dL   Comment: Performed at [facility]  LIPID PANEL     Status: Abnormal   Collection Time    04/27/14  6:43 AM      Result Value Ref Range   Cholesterol 112  0 - 200 mg/dL   Triglycerides 096  <045 mg/dL   HDL 38 (*) >40 mg/dL   Total CHOL/HDL Ratio 2.9     VLDL 21  0 - 40 mg/dL   LDL Cholesterol 53  0 - 99 mg/dL   Comment:            Total Cholesterol/HDL:CHD Risk     Coronary  Heart Disease Risk Table                         Men   Women      1/2 Average Risk   3.4   3.3      Average Risk       5.0   4.4      2 X Average Risk   9.6   7.1      3 X Average Risk  23.4   11.0                Use the calculated Patient Ratio     above and the CHD Risk Table     to determine the patient's CHD Risk.                ATP III CLASSIFICATION (LDL):      <100     mg/dL   Optimal      161-096  mg/dL   Near or Above                        Optimal      130-159  mg/dL   Borderline      045-409  mg/dL   High      >811     mg/dL   Very High  GLUCOSE, CAPILLARY     Status: None   Collection Time    04/27/14  7:44 AM      Result Value Ref Range   Glucose-Capillary 87  70 - 99 mg/dL   Comment 1 Documented in Chart     Comment 2 Notify RN    GLUCOSE, CAPILLARY     Status: Abnormal   Collection Time    04/27/14 11:46 AM      Result Value Ref Range   Glucose-Capillary 105 (*) 70 - 99 mg/dL   Comment 1 Documented in Chart     Comment 2 Notify RN    GLUCOSE, CAPILLARY     Status: Abnormal   Collection Time    04/27/14  4:38 PM      Result Value Ref Range   Glucose-Capillary 185 (*) 70 - 99 mg/dL  GLUCOSE, CAPILLARY     Status: Abnormal   Collection Time    04/27/14  9:24 PM      Result Value Ref Range   Glucose-Capillary 58 (*) 70 - 99 mg/dL  GLUCOSE, CAPILLARY     Status: Abnormal   Collection Time    04/27/14  9:46 PM      Result Value Ref Range   Glucose-Capillary 108 (*) 70 - 99 mg/dL  GLUCOSE, CAPILLARY     Status: None   Collection Time     04/28/14  6:46 AM      Result Value Ref Range   Glucose-Capillary 92  70 - 99 mg/dL  GLUCOSE, CAPILLARY     Status: None   Collection Time    04/28/14  7:14 AM      Result Value Ref Range   Glucose-Capillary 97  70 - 99 mg/dL  GLUCOSE, CAPILLARY     Status: Abnormal   Collection Time    04/28/14 11:21 AM      Result Value Ref Range   Glucose-Capillary 178 (*) 70 - 99 mg/dL   No results found.     Medical Problem List and Plan: 1. Functional deficits secondary to left ACA distribution infarct suspect  embolic secondary to unclear source 2.  DVT Prophylaxis/Anticoagulation: Subcutaneous Lovenox. Monitor platelet counts and any signs of bleeding 3. Pain Management: Tylenol as needed 4. Hypertension. Cardizem 360 mg daily, Cardura 2 mg daily, Lopressor 50 mg twice a day. Monitor with increased mobility 5. Neuropsych: This patient is not yet capable of making decisions on his own behalf. 6. Diabetes mellitus with peripheral neuropathy. Hemoglobin A1c 5.7. Glipizide 5 mg daily. Check blood sugars a.c. and at bedtime. Provide diabetic teaching. 7. Hyperlipidemia. Lipitor 8. CAD with stenting. Cardiac enzymes negative. Continue aspirin Plavix therapy. No chest pain or shortness of breath. 9. History of BPH. Check PVRs x3  Post Admission Physician Evaluation: 1. Functional deficits secondary  to embolic left ACA infarct. 2. Patient is admitted to receive collaborative, interdisciplinary care between the physiatrist, rehab nursing staff, and therapy team. 3. Patient's level of medical complexity and substantial therapy needs in context of that medical necessity cannot be provided at a lesser intensity of care such as a SNF. 4. Patient has experienced substantial functional loss from his/her baseline which was documented above under the "Functional History" and "Functional Status" headings.  Judging by the patient's diagnosis, physical exam, and functional history, the patient has potential for  functional progress which will result in measurable gains while on inpatient rehab.  These gains will be of substantial and practical use upon discharge  in facilitating mobility and self-care at the household level. 5. Physiatrist will provide 24 hour management of medical needs as well as oversight of the therapy plan/treatment and provide guidance as appropriate regarding the interaction of the two. 6. 24 hour rehab nursing will assist with bladder management, bowel management, safety, skin/wound care, disease management, medication administration, pain management and patient education  and help integrate therapy concepts, techniques,education, etc. 7. PT will assess and treat for/with: Lower extremity strength, range of motion, stamina, balance, functional mobility, safety, adaptive techniques and equipment, NMR, orthotics, stroke education, caregiver training, egosupport.   Goals are: supervision to min assist. 8. OT will assess and treat for/with: ADL's, functional mobility, safety, upper extremity strength, adaptive techniques and equipment, NMR, stroke education, ego-support, caregiver training.   Goals are: supervision to min assist. 9. SLP will assess and treat for/with: communication, cognition, swallowing.  Goals are: supervision to mod I. 10. Case Management and Social Worker will assess and treat for psychological issues and discharge planning. 11. Team conference will be held weekly to assess progress toward goals and to determine barriers to discharge. 12. Patient will receive at least 3 hours of therapy per day at least 5 days per week. 13. ELOS: 15-20 days       14. Prognosis:  excellent     Ranelle OysterZachary T. Yukie Bergeron, MD, [facility] Inc[facility]AP[facility]   04/28/2014

## 2014-04-28 NOTE — Progress Notes (Signed)
Rehab admissions - Evaluated for possible admission.  I met with patient and his daughter.  They are interested in inpatient rehab admission.  I gave them rehab brochures and answered questions about rehab.  Bed available and will admit to acute inpatient rehab today.  Call me for questions.  #[phone]

## 2014-04-28 NOTE — Progress Notes (Signed)
On admission, patient had order for in and out cath PRN; however, patient came to the unit with a foley catheter.  Nicholas NestlePam Love, PA notified and orders to keep the foley tonight and discontinue the foley in the morning.  Will continue to monitor.

## 2014-04-28 NOTE — Progress Notes (Signed)
Report given to Cala BradfordKimberly RN at inpatient rehab.  Patient to be transferred shortly.  Nicholas [name] 04/28/2014 4:43 PM

## 2014-04-28 NOTE — PMR Pre-admission (Signed)
PMR Admission Coordinator Pre-Admission Assessment  Patient: Nicholas [name] is an 63 y.o., male MRN: [id number] DOB: 1951/10/25 Height:   Weight: 106.686 kg (235 lb 3.2 oz)              Insurance Information HMO: No    PPO:       PCP:       IPA:       80/20:       OTHER:   PRIMARY: Medicare A/B      Policy#: [id number]      Subscriber: [name] CM Name:        Phone#:       Fax#:   Pre-Cert#:        Employer: Not employed Benefits:  Phone #:       Name: Checked in [place]. Date: 06/09/02     Deduct: $1260      Out of Pocket Max: none      Life Max: unlimited CIR: 100%      SNF: 100 days Outpatient: 80%     Co-Pay: 20% Home Health: 100%      Co-Pay: None DME: 80%     Co-Pay: 20% Providers: patient's choice   Emergency Contact Information Contact Information   Name Relation Home Work Mobile   [name],[name] Daughter [phone]  [phone]   [name],[name] Relative   [phone]   [name], [name] [phone]       Current Medical History  Patient Admitting Diagnosis: L ACA infarct  History of Present Illness: A 63 y.o.right handed male with history of hypertension, diabetes mellitus peripheral neuropathy, 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 Patient lives alone independent with a cane prior to admission. Admitted 04/26/2014 and altered mental status.  MRI with acute nonhemorrhagic left ACA territory infarct as well as remote right parietal lobe infarct. MRA of the head with occlusion of the internal carotid artery with reconstitution at the level the posterior to indicating artery.echo with EF 55% without embolism.Carotid doppler without ICA stenosis.Cardiac enzymes negative.Neurology consulted placed on plavix/aspirin for CVA prophylaxis and subcutaneous Lovenox for DVT prophylaxis. Patient is maintained on a mechanical soft diet. Physical  occupational therapy evaluations completed 04/27/2014 recommendations of physical medicine rehabilitation consult. Admitted for comprehensive inpatient rehabilitation program.    Total: 6=NIH  Past Medical History  Past Medical History  Diagnosis Date  . Hypertension   . Hyperlipidemia   . Type 2 diabetes mellitus   . History of myocardial infarction     1993--  NON-Q WAVE  S/P PCI TO LAD  . History of CVA (cerebrovascular accident)     1993---  NO RESIDUALS  . Hydrocele, left   . BPH (benign prostatic hypertrophy)     HX RETENTION  . PSVT (paroxysmal supraventricular tachycardia)   . Coronary artery disease CARDIOLOGIST--  DR Johney Frame    S/P  PCI TO LAD 1993  . Carotid artery occlusion     S/P RIGHT CEA  . PVD (peripheral vascular disease)     S/P LEFT FEM-POP  . Left carotid artery stenosis     MILD  . History of head injury     CONCUSSION--  NO RESIDUAL  . Right leg weakness     USES CANE--  SECONDARY TO PVD  . At high risk for falls   . Wears glasses   . Myocardial infarction   .  GERD (gastroesophageal reflux disease)     Family History  family history includes Hypertension in his father and mother; Stroke in his mother.  Prior Rehab/Hospitalizations:  None   Current Medications  Current facility-administered medications:aspirin EC tablet 81 mg, 81 mg, Oral, Daily, Layne BentonSharon L Biby, NP;  atorvastatin (LIPITOR) tablet 10 mg, 10 mg, Oral, q1800, Jerald KiefStephen K Chiu, MD, 10 mg at 04/27/14 2216;  bisacodyl (DULCOLAX) suppository 10 mg, 10 mg, Rectal, Once, Jerald KiefStephen K Chiu, MD;  clopidogrel (PLAVIX) tablet 75 mg, 75 mg, Oral, Q breakfast, Ripudeep K Rai, MD, 75 mg at 04/28/14 1024 diltiazem (CARDIZEM) injection 5 mg, 5 mg, Intravenous, Q4H PRN, Jerald KiefStephen K Chiu, MD;  diltiazem [facility](TIAZAC) 24 hr capsule 360 mg, 360 mg, Oral, Daily, Eduard ClosArshad N Kakrakandy, MD, 360 mg at 04/28/14 1024;  docusate sodium (COLACE) capsule 100 mg, 100 mg, Oral, BID, Eduard ClosArshad N Kakrakandy, MD, 100 mg at 04/28/14 1020;   doxazosin (CARDURA) tablet 2 mg, 2 mg, Oral, Daily, Eduard ClosArshad N Kakrakandy, MD, 2 mg at 04/28/14 1020 enoxaparin (LOVENOX) injection 40 mg, 40 mg, Subcutaneous, Q24H, Eduard ClosArshad N Kakrakandy, MD, 40 mg at 04/28/14 1021;  glipiZIDE (GLUCOTROL XL) 24 hr tablet 5 mg, 5 mg, Oral, Daily, Eduard ClosArshad N Kakrakandy, MD, 5 mg at 04/28/14 1021;  insulin aspart (novoLOG) injection 0-9 Units, 0-9 Units, Subcutaneous, TID WC, Eduard ClosArshad N Kakrakandy, MD, 2 Units at 04/28/14 1412;  ketorolac (TORADOL) 15 MG/ML injection 15 mg, 15 mg, Intravenous, Q6H PRN, Jerald KiefStephen K Chiu, MD metoprolol (LOPRESSOR) tablet 50 mg, 50 mg, Oral, BID, Eduard ClosArshad N Kakrakandy, MD, 50 mg at 04/28/14 1021;  senna-docusate (Senokot-S) tablet 1 tablet, 1 tablet, Oral, QHS PRN, Eduard ClosArshad N Kakrakandy, MD  Patients Current Diet: Dysphagia  Precautions / Restrictions Precautions Precautions: Fall Precaution Comments: blurry vision Restrictions Weight Bearing Restrictions: No   Prior Activity Level Community (5-7x/wk): Went out daily.  Not driving.  Daughter does the driving.  Home Assistive Devices / Equipment Home Assistive Devices/Equipment: Cane (specify quad or straight) Home Equipment: None  Prior Functional Level Prior Function Level of Independence: Independent with assistive device(s) Comments: Pt does not drive - reports dtr drives him  Current Functional Level Cognition  Arousal/Alertness: Awake/alert Overall Cognitive Status: Impaired/Different from baseline Current Attention Level: Sustained Orientation Level: Oriented X4 Following Commands: Follows one step commands with increased time Safety/Judgement: Decreased awareness of safety;Decreased awareness of deficits General Comments: Pt with delayed processing.  Pt with very little output with OT - question if due to embarrassment with family in room.  RN reports pt's demeanor changed after PT Attention: Focused;Sustained Focused Attention: Appears intact Sustained Attention:  Impaired Sustained Attention Impairment: Verbal complex;Functional complex Memory: Impaired Memory Impairment: Storage deficit;Retrieval deficit Awareness: Impaired Awareness Impairment: Intellectual impairment;Emergent impairment;Anticipatory impairment Problem Solving: Impaired Problem Solving Impairment: Verbal basic;Functional basic Executive Function: Reasoning Behaviors:  (flat affect) Safety/Judgment: Impaired    Extremity AssessmentUpper Extremity Assessment: Generalized weakness        Upper Extremity Assessment: Generalized weakness  Lower Extremity Assessment: RLE deficits/detail;LLE deficits/detail  RLE Deficits / Details: pt with grossly 2+/5 pt able to move legs in bed and in sitting but unable to complete full ROM in either position and assume more related to cognition then strength. Pt able to localize touch and reports equal  LLE Deficits / Details: pt with grossly 2+/5 pt able to move legs in bed and in sitting but unable to complete full ROM in either position and assume more related to cognition then strength. Pt able to localize touch  and reports equal  Cervical / Trunk Assessment: Normal     ADLs  Overall ADL's : Needs assistance/impaired Eating/Feeding: Moderate assistance;Bed level Grooming: Wash/dry hands;Wash/dry face;Moderate assistance;Bed level Upper Body Bathing: Moderate assistance;Bed level Lower Body Bathing: Total assistance;Bed level Upper Body Dressing : Total assistance;Bed level Lower Body Dressing: Total assistance;Bed level Toilet Transfer: Total assistance (unable) Toileting- Clothing Manipulation and Hygiene: Total assistance;Bed level Functional mobility during ADLs: Maximal assistance (to move and to sit EOB) General ADL Comments: Pt required max A to sit EOB. Pt with flat affect and only minimally particiaptory.  Pt's daughter and granddaughter present - unsure if pt self conscious and not engaging unless max prompts provided.  RN  reports pt's affect changed significantly after PT - questionable depression after realizing extent of deficits.     Mobility  Overal bed mobility: Needs Assistance Bed Mobility: Supine to Sit;Sit to Supine Rolling: Min assist Supine to sit: Total assist Sit to supine: Max assist General bed mobility comments: Patient sitting up in recliner before and after session    Transfers  Overall transfer level: Needs assistance Equipment used: Rolling walker (2 wheeled) Transfers: Sit to/from Stand Sit to Stand: +2 physical assistance;Mod assist General transfer comment: A to power up into standing, to ensure balance/weight shift and to block R LE with standing. Cues for proper technique and use of RW. Patient able to pick up right and left foot while standing with heavy lean to the right side. Patient with uncontrolled descents to recliner.     Ambulation / Gait / Stairs / Engineer, drillingWheelchair Mobility       Posture / Balance Dynamic Sitting Balance Sitting balance - Comments: Pt falling to Rt. with little to no initiation to correct.  Requires max A to maintain EOB sitting     Special needs/care consideration BiPAP/CPAP No CPM No Continuous Drip IV No Dialysis No         Life Vest No Oxygen No Special Bed No Trach Size No Wound Vac (area) No       Skin Dry                         Bowel mgmt: Had last BM 04/26/14 and problems with constipation Bladder mgmt: Foley catheter in place for urinary retention Diabetic mgmt Yes, on medications orally at home.    Previous Home Environment Living Arrangements: Alone  Lives With: Alone Available Help at Discharge: Family;Available 24 hours/day Type of Home: Apartment Home Layout: One level Home Access: Level entry Home Care Services: No  Discharge Living Setting Plans for Discharge Living Setting: House;Lives with (comment) (Plans to go home with daughter and son in law.) Type of Home at Discharge: House Discharge Home Layout: Two level;Able to  live on main level with bedroom/bathroom (Can stay in guest room with bathroom on main level.) Alternate Level Stairs-Number of Steps: Flight Discharge Home Access: Level entry (Has small lip at doorway entrance.) Does the patient have any problems obtaining your medications?: No  Social/Family/Support Systems Patient Roles: Parent Contact Information: Myra RudeDaphne [name] - daughter Anticipated Caregiver: daughter and son-in-law Anticipated Caregiver's Contact Information: [name] [phone] Ability/Limitations of Caregiver: Dtr works days as a Runner, broadcasting/film/videoteacher and son-in-law works evenings Caregiver Availability: 24/7 Discharge Plan Discussed with Primary Caregiver: Yes Is Caregiver In Agreement with Plan?: Yes Does Caregiver/Family have Issues with Lodging/Transportation while Pt is in Rehab?: No  Goals/Additional Needs Patient/Family Goal for Rehab: PT/OT min Assist, ST supervision goals Expected length of stay:  14-21 days Cultural Considerations: None Dietary Needs: Dys 3, thin liquids Equipment Needs: TBD Pt/Family Agrees to Admission and willing to participate: Yes Program Orientation Provided & Reviewed with Pt/Caregiver Including Roles  & Responsibilities: Yes  Decrease burden of Care through IP rehab admission: N/A  Possible need for SNF placement upon discharge: Not planned  Patient Condition: This patient's condition remains as documented in the consult dated 04/28/14, in which the Rehabilitation Physician determined and documented that the patient's condition is appropriate for intensive rehabilitative care in an inpatient rehabilitation facility. Will admit to inpatient rehab today.  Preadmission Screen Completed By:  Trish Mage, 04/28/2014 3:06 PM ______________________________________________________________________   Discussed status with Dr. Riley Kill on 04/28/14 at 1453 and received telephone approval for admission today.  Admission Coordinator:  Trish Mage,  time1453/Date05/20/15

## 2014-04-28 NOTE — Discharge Summary (Signed)
Physician Discharge Summary  Patient ID: Nicholas [name] MRN: [id number] DOB/AGE: 63-Jun-1952 63 y.o.  Admit date: 04/26/2014 Discharge date: 04/28/2014  Primary Care Physician:  [name], MD  Discharge Diagnoses:   .  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 Acute encephalopathy- resolved . HYPERLIPIDEMIA-MIXED . HYPERTENSION, UNSPECIFIED . CAD, NATIVE VESSEL . Abdominal pain  Consults:  Neurology   Recommendations for Outpatient Follow-up:  Please continue Aspirin and plavix for 3 months, then continue Plavix indefinitely   Allergies:  No Known Allergies   Discharge Medications:   Medication List         aspirin 81 MG EC tablet  Take 1 tablet (81 mg total) by mouth daily. Take Aspirin and plavix for 3 months, then plavix alone indefintely     clopidogrel 75 MG tablet  Commonly known as:  PLAVIX  Take 1 tablet (75 mg total) by mouth daily with breakfast.     docusate sodium 100 MG capsule  Commonly known as:  COLACE  Take 100 mg by mouth 2 (two) times daily.     doxazosin 2 MG tablet  Commonly known as:  CARDURA  Take 2 mg by mouth daily.     glipiZIDE 5 MG 24 hr tablet  Commonly known as:  GLUCOTROL XL  Take 5 mg by mouth daily.     metFORMIN 1000 MG tablet  Commonly known as:  GLUCOPHAGE  Take 1,000 mg by mouth 2 (two) times daily with a meal.     metoprolol 50 MG tablet  Commonly known as:  LOPRESSOR  Take 1 tablet (50 mg total) by mouth 2 (two) times daily.     rosuvastatin 10 MG tablet  Commonly known as:  CRESTOR  Take 10 mg by mouth daily.     TAZTIA XT 360 MG 24 hr capsule  Generic drug:  diltiazem  Take 360 mg by mouth daily.         Brief H and P: For complete details please refer to admission H and P, but in briefWillie J [name] is a 63 y.o. male with history of CVA, hypertension, hyperlipidemia, CAD was brought to the ER after patient was found on the floor and possibly lost his consciousness. Patient had  earlier called his brother because he had some hematuria. By the time EMS reached patient was found to be on the floor and patient feels he may have lost his consciousness. On arrival the ER patient was found to be lethargic and drowsy. UA and urine drug scan pending. CT head showed possible acute to subacute stroke. Patient had chronic left lower extremity weakness from previous injury as per the daughter. On-call neurologist Dr. Roseanne Reno was consulted by the ED physician at this time Dr. Roseanne Reno has requested transfer patient to [facility] for further stroke workup. Patient states that his abdomen is still mildly painful mostly in the right flank area. Patient is to lethargic but follows commands. He is oriented to his name and place. Moves all extremities. Denies any chest pain or shortness of breath.     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 with reconstitution at the level of PCA, patient had a history of right carotid endarterectomy more than 10 years,  - 2-D echo showed mild to moderate aortic stenosis no cardiac source of embolism,  EF 50-55%  - Carotid Dopplers showed 1-39% stenosis involving right ICA and left ICA  - EEG negative for seizures  - PT OT recommended CIR  - Currently tolerating dysphagia 3 diet  - Currently on aspirin and Plavix for 3months, then plavix indefinitely   - Continue Lipitor   HYPERLIPIDEMIA-MIXED  -LDL 53, on Lipitor   HYPERTENSION, UNSPECIFIED  -Stable   CAD, NATIVE VESSEL  - Continue aspirin, Plavix, statin, Cardizem   Abdominal pain  - CT abdomen and pelvis - showed no acute findings, colonic diverticulosis, small umbilical hernia  -UA negative for any UTI   DIABETES mellitus  -CBG stable    Day of Discharge BP 150/55  Pulse 69  Temp(Src) 98 F (36.7 C)  (Oral)  Resp 16  Wt 106.686 kg (235 lb 3.2 oz)  SpO2 95%  Physical Exam: General: Alert and awake oriented x3 not in any acute distress. CVS: S1-S2 clear no murmur rubs or gallops Chest: clear to auscultation bilaterally, no wheezing rales or rhonchi Abdomen: soft nontender, nondistended, normal bowel sounds Extremities: no cyanosis, clubbing or edema noted bilaterally Neuro: Cranial nerves II-XII intact, no focal neurological deficits   The results of significant diagnostics from this hospitalization (including imaging, microbiology, ancillary and laboratory) are listed below for reference.    LAB RESULTS: Basic Metabolic Panel:  Recent Labs Lab 04/26/14 0350 04/26/14 0935  NA 140 140  K 3.8 3.9  CL 102 102  CO2 25 24  GLUCOSE 108* 93  BUN 8 7  CREATININE 0.92 0.97  CALCIUM 9.4 9.2   Liver Function Tests:  Recent Labs Lab 04/26/14 0350 04/26/14 0935  AST 28 30  ALT 28 28  ALKPHOS 77 80  BILITOT 0.7 0.8  PROT 7.3 6.9  ALBUMIN 3.9 3.8   No results found for this basename: LIPASE, AMYLASE,  in the last 168 hours No results found for this basename: AMMONIA,  in the last 168 hours CBC:  Recent Labs Lab 04/26/14 0350 04/26/14 0935  WBC 7.5 7.9  NEUTROABS  --  5.0  HGB 13.8 14.1  HCT 39.9 41.5  MCV 95.2 97.6  PLT 361 329   Cardiac Enzymes:  Recent Labs Lab 04/26/14 0935  CKTOTAL 487*  TROPONINI <0.30   BNP: No components found with this basename: POCBNP,  CBG:  Recent Labs Lab 04/28/14 0714 04/28/14 1121  GLUCAP 97 178*    Significant Diagnostic Studies:  Ct Abdomen Pelvis Wo Contrast  04/26/2014   CLINICAL DATA:  Abdominal pain.  Evaluate for hydronephrosis.  EXAM: CT ABDOMEN AND PELVIS WITHOUT CONTRAST  TECHNIQUE: Multidetector CT imaging of the abdomen and pelvis was performed following the standard protocol without IV contrast.  COMPARISON:  No priors.  FINDINGS: Lung Bases: Calcifications of the aortic valve. Atherosclerotic  calcifications in the right coronary artery.  Abdomen/Pelvis: No abnormal calcifications are identified within the collecting system of either kidney, along the course of either ureter, or within the lumen of the urinary bladder. No hydroureteronephrosis to suggest urinary tract obstruction at this time. The unenhanced appearance of the kidneys is unremarkable bilaterally.  The unenhanced appearance of the liver, gallbladder, pancreas, spleen and bilateral adrenal glands is unremarkable. Atherosclerosis throughout the abdominal and pelvic vasculature, without evidence of aneurysm. Small umbilical hernia containing only omental fat incidentally noted. No evidence of bowel incarceration or obstruction at this time. No significant volume of ascites. No pneumoperitoneum. Numerous colonic diverticulae are noted, most pronounced in the descending and sigmoid colon, without surrounding inflammatory changes  to suggest an acute diverticulitis at this time. Normal appendix. Prostate gland and urinary bladder are unremarkable in appearance.  Musculoskeletal: There are no aggressive appearing lytic or blastic lesions noted in the visualized portions of the skeleton.  IMPRESSION: 1. No acute findings in the abdomen or pelvis. 2. Specifically, no abnormal urinary tract calculi or findings of urinary tract obstruction at this time. 3. Colonic diverticulosis without findings to suggest acute diverticulitis at this time. 4. Normal appendix. 5. Small umbilical hernia containing a small amount of omental fat. No associated bowel incarceration or obstruction at this time. 6. Atherosclerosis, including right coronary artery disease. Please note that although the presence of coronary artery calcium documents the presence of coronary artery disease, the severity of this disease and any potential stenosis cannot be assessed on this non-gated CT examination. Assessment for potential risk factor modification, dietary therapy or pharmacologic  therapy may be warranted, if clinically indicated. 7. There are calcifications of the aortic valve. Echocardiographic correlation for evaluation of potential valvular dysfunction may be warranted if clinically indicated.   Electronically Signed   By: Trudie Reed M.D.   On: 04/26/2014 06:37   Dg Chest 2 View  04/26/2014   CLINICAL DATA:  Stroke.  EXAM: CHEST  2 VIEW  COMPARISON:  No prior.  FINDINGS: Bilateral pulmonary infiltrates are present. Differential diagnosis includes pulmonary edema and/or pneumonia. Borderline cardiomegaly with mild pulmonary vascular prominence. No pleural effusion or pneumothorax.  IMPRESSION: Bilateral pulmonary infiltrates. Differential diagnosis includes pulmonary edema and/or node bilateral pneumonia.   Electronically Signed   By: Maisie Fus  Register   On: 04/26/2014 12:13   Ct Head Wo Contrast  04/26/2014   CLINICAL DATA:  Weakness.  EXAM: CT HEAD WITHOUT CONTRAST  TECHNIQUE: Contiguous axial images were obtained from the base of the skull through the vertex without intravenous contrast.  COMPARISON:  CT NECK W/CM dated 07/03/2010; CT HEAD W/O CM dated 04/02/2009  FINDINGS: The ventricles and sulci are normal for age. No intraparenchymal hemorrhage, mass effect nor midline shift. Patchy supratentorial white matter hypodensities are within normal range for patient's age and though non-specific suggest sequelae of chronic small vessel ischemic disease. Focal loss of the left medial frontal/ cingulate gray-white matter differentiation (axial 20/31). Right frontoparietal cystic encephalomalacia with mild ex vacuo dilatation of the subjacent ventricle.  No abnormal extra-axial fluid collections. Basal cisterns are patent. Moderate calcific atherosclerosis of the carotid siphons.  No skull fracture. The included ocular globes and orbital contents are non-suspicious. Mild paranasal sinus mucosal thickening without air-fluid levels.  IMPRESSION: Focal loss of the left medial frontal  gray white matter differentiation concerning for acute to subacute ischemia, left anterior cerebral artery territory. MRI of the brain with diffusion-weighted sequences would be more sensitive.  Remote right frontoparietal infarcts.  Findings discussed with and reconfirmed by Dr.COURTNEY, Nicholas [name] on5/18/2015at4:39 AM.   Electronically Signed   By: Awilda Metro   On: 04/26/2014 04:40   Mr Brain Wo Contrast  04/26/2014   CLINICAL DATA:  Loss of consciousness. Stroke. Chronic left lower extremity weakness. Left upper extremity weakness and and  EXAM: MRI HEAD WITHOUT CONTRAST  MRA HEAD WITHOUT CONTRAST  TECHNIQUE: Multiplanar, multiecho pulse sequences of the brain and surrounding structures were obtained without intravenous contrast. Angiographic images of the head were obtained using MRA technique without contrast.  COMPARISON:  CT head from the same day.  FINDINGS: MRI HEAD FINDINGS  The diffusion-weighted images demonstrate an acute nonhemorrhagic infarct within the left ACA territory. No hemorrhage  or mass lesion is present.  A remote right parietal lobe infarct is evident. Mild generalized atrophy is noted. There is ex vacuo dilation of the right lateral ventricle compatible with the prior infarct.  Right ICA and MCA conclusion is noted.  MRA HEAD FINDINGS  The MRA study is moderately degraded by patient motion. The right internal carotid artery is occluded. The left internal carotid artery is unremarkable. There is mild irregularity in the left A1 and M1 segments, likely exaggerated by patient motion. There is some flow in the right A1 segment. Terminal right ICA is reconstituted at the level of the right posterior communicating artery although no dominant artery is seen.  The right vertebral artery is dominant to the left. The basilar artery is within normal limits. There is moderate signal loss at the origin of the left posterior cerebral artery. Both posterior cerebral arteries originate from the  basilar tip.  IMPRESSION: 1. Acute nonhemorrhagic left ACA territory infarct. 2. Remote right parietal lobe infarct. 3. 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 These results will be called to the ordering clinician or representative by the Radiologist Assistant, and communication documented in the PACS or zVision Dashboard.   Electronically Signed   By: Gennette Pachris  Mattern M.D.   On: 04/26/2014 11:46   Mr Nicholas GlennMra Head/brain Wo Cm  04/26/2014   CLINICAL DATA:  Loss of consciousness. Stroke. Chronic left lower extremity weakness. Left upper extremity weakness and and  EXAM: MRI HEAD WITHOUT CONTRAST  MRA HEAD WITHOUT CONTRAST  TECHNIQUE: Multiplanar, multiecho pulse sequences of the brain and surrounding structures were obtained without intravenous contrast. Angiographic images of the head were obtained using MRA technique without contrast.  COMPARISON:  CT head from the same day.  FINDINGS: MRI HEAD FINDINGS  The diffusion-weighted images demonstrate an acute nonhemorrhagic infarct within the left ACA territory. No hemorrhage or mass lesion is present.  A remote right parietal lobe infarct is evident. Mild generalized atrophy is noted. There is ex vacuo dilation of the right lateral ventricle compatible with the prior infarct.  Right ICA and MCA conclusion is noted.  MRA HEAD FINDINGS  The MRA study is moderately degraded by patient motion. The right internal carotid artery is occluded. The left internal carotid artery is unremarkable. There is mild irregularity in the left A1 and M1 segments, likely exaggerated by patient motion. There is some flow in the right A1 segment. Terminal right ICA is reconstituted at the level of the right posterior communicating artery although no dominant artery is seen.  The right vertebral artery is dominant to the left. The basilar artery is within normal limits. There is moderate signal loss at the origin of  the left posterior cerebral artery. Both posterior cerebral arteries originate from the basilar tip.  IMPRESSION: 1. Acute nonhemorrhagic left ACA territory infarct. 2. Remote right parietal lobe infarct. 3. 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 These results will be called to the ordering clinician or representative by the Radiologist Assistant, and communication documented in the PACS or zVision Dashboard.   Electronically Signed   By: Gennette Pachris  Mattern M.D.   On: 04/26/2014 11:46    2D ECHO: Study Conclusions  - Left ventricle: The cavity size was normal. Systolic function was normal. The estimated ejection fraction was in the range of 50% to 55%. Regional wall motion abnormalities cannot be excluded. Doppler parameters are consistent  with high ventricular filling pressure. - Aortic valve: There was mild to moderate stenosis. There was moderate regurgitation. Peak velocity (S): 2.7 m/sec. Mean gradient (S): 15 mm Hg.  Impressions:  - No cardiac source of embolism was identified, but cannot be ruled out on the basis of this examination.     Disposition and Follow-up:    DISPOSITION: CIR   DIET: DYSPHAGIA 3 DIET, THIN LIQUIDS   DISCHARGE FOLLOW-UP     Follow-up Information   Follow up with Ursula BeathURNBULL, JENNIFER, MD. Schedule an appointment as soon as possible for a visit in 2 weeks. (for hospital follow-up)    Specialty:  Family Medicine   Contact information:   [facility] [address] [place] [address] [phone]       Follow up with Gates RiggSETHI,PRAMODKUMAR P, MD. Schedule an appointment as soon as possible for a visit in 2 months. (for hospital follow-up, stroke clinic)    Specialties:  Neurology, Radiology   Contact information:   [address] [place] [address] [phone]       Time spent on Discharge: 45 MINS  Signed:   Cathren Harshipudeep K  Rai M.D. [facility] 04/28/2014, 2:32 PM Pager: [phone]   **Disclaimer: This note was dictated with voice recognition software. Similar sounding words can inadvertently be transcribed and this note may contain transcription errors which may not have been corrected upon publication of note.**

## 2014-04-28 NOTE — Consult Note (Signed)
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 Patient lives alone independent with a cane prior to admission. Admitted 04/26/2014 and altered mental status. MRI with acute nonhemorrhagic left ACA territory infarct as well as remote right parietal lobe infarct. 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 Physical occupational therapy evaluations completed 04/27/2014 recommendations of physical medicine rehabilitation consult.  Patient feels his biggest problem after stroke is with his memory. 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  Musculoskeletal: Positive for falls.  Neurological: Positive for weakness.  All other systems reviewed and are negative.  Past Medical History  Diagnosis Date  . Hypertension   . Hyperlipidemia   . Type 2 diabetes mellitus   . History of myocardial infarction     1993--  NON-Q WAVE  S/P PCI TO LAD  . History of CVA (cerebrovascular accident)     1993---  NO RESIDUALS  . Hydrocele, left   . BPH (benign prostatic hypertrophy)     HX RETENTION  . PSVT (paroxysmal supraventricular tachycardia)   . Coronary artery disease CARDIOLOGIST--  DR Johney Frame    S/P  PCI TO LAD 1993  . Carotid artery occlusion     S/P RIGHT CEA  . PVD (peripheral vascular  disease)     S/P LEFT FEM-POP  . Left carotid artery stenosis     MILD  . History of head injury     CONCUSSION--  NO RESIDUAL  . Right leg weakness     USES CANE--  SECONDARY TO PVD  . At high risk for falls   . Wears glasses   . Myocardial infarction   . GERD (gastroesophageal reflux disease)    Past Surgical History  Procedure Laterality Date  . Carotid endarterectomy Right 05-31-2003  . Femoral-popliteal bypass graft Left 01-19-2003  . Right hydrocelectomy  05-31-2003  . Coronary angioplasty  1993    PCI TO LAD  . Cardiac catheterization  01-18-2003   DR NISHAN    NON-OBSTRUCTIVE CAD/  PREVIOIS ANGIOPLASTY SITE WIDELY PATENT  . Cardiovascular stress test  2007    SMALL ANTERO-APICAL SCAR/  NO ISCHEMIA  . Transthoracic echocardiogram  08-24-2010    EF 55%/  MILD AORTIC INSUFFICENCY/  MODERATE LVH  . Hydrocele excision Left 10/27/2013    Procedure: HYDROCELECTOMY ADULT;  Surgeon: Lindaann Slough, MD;  Location: [facility];  Service: Urology;  Laterality: Left;   Family History  Problem Relation Age of Onset  . Stroke Mother   . Hypertension Mother   . Hypertension Father    Social History:  reports that he quit smoking about 20 years ago. His smoking use included Cigarettes. He smoked 0.00 packs per day for 0 years. He has never used smokeless tobacco. He reports that he does not drink alcohol or use illicit drugs. Allergies: No Known Allergies Medications Prior to Admission  Medication Sig Dispense Refill  .  aspirin 325 MG EC tablet Take 325 mg by mouth daily.      Marland Kitchen diltiazem (TAZTIA XT) 360 MG 24 hr capsule Take 360 mg by mouth daily.      Marland Kitchen docusate sodium (COLACE) 100 MG capsule Take 100 mg by mouth 2 (two) times daily.      Marland Kitchen doxazosin (CARDURA) 2 MG tablet Take 2 mg by mouth daily.      Marland Kitchen glipiZIDE (GLUCOTROL XL) 5 MG 24 hr tablet Take 5 mg by mouth daily.      . metFORMIN (GLUCOPHAGE) 1000 MG tablet Take 1,000 mg by mouth 2 (two) times daily with  a meal.      . metoprolol (LOPRESSOR) 50 MG tablet Take 1 tablet (50 mg total) by mouth 2 (two) times daily.  60 tablet  3  . rosuvastatin (CRESTOR) 10 MG tablet Take 10 mg by mouth daily.        Home: Home Living Family/patient expects to be discharged to:: Inpatient rehab Living Arrangements: Alone Available Help at Discharge: Family;Available 24 hours/day Type of Home: Apartment Home Access: Level entry Home Layout: One level Home Equipment: None  Lives With: Alone  Functional History: Prior Function Level of Independence: Independent with assistive device(s) Comments: Pt does not drive - reports dtr drives him Functional Status:  Mobility: Bed Mobility Overal bed mobility: Needs Assistance Bed Mobility: Supine to Sit;Sit to Supine Rolling: Min assist Supine to sit: Total assist Sit to supine: Max assist General bed mobility comments: Pt required assist with all aspects of moving sit to supine and supine to sit.  Transfers Overall transfer level: Needs assistance Transfers: Sit to/from Stand Sit to Stand: Total assist (unable to achieve with +1 assist)      ADL: ADL Overall ADL's : Needs assistance/impaired Eating/Feeding: Moderate assistance;Bed level Grooming: Wash/dry hands;Wash/dry face;Moderate assistance;Bed level Upper Body Bathing: Moderate assistance;Bed level Lower Body Bathing: Total assistance;Bed level Upper Body Dressing : Total assistance;Bed level Lower Body Dressing: Total assistance;Bed level Toilet Transfer: Total assistance (unable) Toileting- Clothing Manipulation and Hygiene: Total assistance;Bed level Functional mobility during ADLs: Maximal assistance (to move and to sit EOB) General ADL Comments: Pt required max A to sit EOB. Pt with flat affect and only minimally particiaptory.  Pt's daughter and granddaughter present - unsure if pt self conscious and not engaging unless max prompts provided.  RN reports pt's affect changed significantly  after PT - questionable depression after realizing extent of deficits.   Cognition: Cognition Overall Cognitive Status: Impaired/Different from baseline Arousal/Alertness: Awake/alert Orientation Level: Oriented to person;Oriented to place;Disoriented to time;Disoriented to situation Attention: Focused;Sustained Focused Attention: Appears intact Sustained Attention: Impaired Sustained Attention Impairment: Verbal complex;Functional complex Memory: Impaired Memory Impairment: Storage deficit;Retrieval deficit Awareness: Impaired Awareness Impairment: Intellectual impairment;Emergent impairment;Anticipatory impairment Problem Solving: Impaired Problem Solving Impairment: Verbal basic;Functional basic Executive Function: Reasoning Behaviors:  (flat affect) Safety/Judgment: Impaired Cognition Arousal/Alertness: Awake/alert Behavior During Therapy: Flat affect Overall Cognitive Status: Impaired/Different from baseline Area of Impairment: Attention;Memory;Following commands;Safety/judgement;Awareness;Problem solving Orientation Level: Time Current Attention Level: Sustained Memory: Decreased short-term memory Following Commands: Follows one step commands with increased time Safety/Judgement: Decreased awareness of safety;Decreased awareness of deficits Awareness: Intellectual Problem Solving: Slow processing;Decreased initiation;Difficulty sequencing;Requires verbal cues;Requires tactile cues General Comments: Pt with delayed processing.  Pt with very little output with OT - question if due to embarrassment with family in room.  RN reports pt's demeanor changed after PT  Blood pressure 140/51, pulse 73, temperature 98.6 F (37 C), temperature source Oral, resp.  rate 18, weight 106.686 kg (235 lb 3.2 oz), SpO2 92.00%. Physical Exam  HENT:  Head: Normocephalic.  Eyes:  Pupils reactive to light  Neck: Normal range of motion. Neck supple. No thyromegaly present.  Cardiovascular:  Normal rate and regular rhythm.   Respiratory: Effort normal and breath sounds normal. No respiratory distress.  GI: Soft. Bowel sounds are normal. He exhibits no distension.  Neurological:  Flat affect.Oriented to person,place,age and DOB.Follows simple commands .Fair awareness of deficits  Skin: Skin is warm and dry.  Scar right side of neck   motor strength is 5/5 bilateral deltoid, bicep, tricep, grip Minimal dysmetria bilaterally finger-nose-finger Right lower extremity 4/5 hip flexor knee extensor ankle dorsi flexion plantar flexor Left extremity 4/5 in the left hip flexor knee extensor 3 minus ankle dorsiflexor plantar flexor Sensation intact to light touch R. upper and lower limbs Patient is not oriented to Month day or date  Results for orders placed during the hospital encounter of 04/26/14 (from the past 24 hour(s))  GLUCOSE, CAPILLARY     Status: None   Collection Time    04/27/14  7:44 AM      Result Value Ref Range   Glucose-Capillary 87  70 - 99 mg/dL   Comment 1 Documented in Chart     Comment 2 Notify RN    GLUCOSE, CAPILLARY     Status: Abnormal   Collection Time    04/27/14 11:46 AM      Result Value Ref Range   Glucose-Capillary 105 (*) 70 - 99 mg/dL   Comment 1 Documented in Chart     Comment 2 Notify RN    GLUCOSE, CAPILLARY     Status: Abnormal   Collection Time    04/27/14  4:38 PM      Result Value Ref Range   Glucose-Capillary 185 (*) 70 - 99 mg/dL  GLUCOSE, CAPILLARY     Status: Abnormal   Collection Time    04/27/14  9:24 PM      Result Value Ref Range   Glucose-Capillary 58 (*) 70 - 99 mg/dL  GLUCOSE, CAPILLARY     Status: Abnormal   Collection Time    04/27/14  9:46 PM      Result Value Ref Range   Glucose-Capillary 108 (*) 70 - 99 mg/dL  GLUCOSE, CAPILLARY     Status: None   Collection Time    04/28/14  6:46 AM      Result Value Ref Range   Glucose-Capillary 92  70 - 99 mg/dL  GLUCOSE, CAPILLARY     Status: None   Collection Time     04/28/14  7:14 AM      Result Value Ref Range   Glucose-Capillary 97  70 - 99 mg/dL   Dg Chest 2 View  1/61/09605/18/2015   CLINICAL DATA:  Stroke.  EXAM: CHEST  2 VIEW  COMPARISON:  No prior.  FINDINGS: Bilateral pulmonary infiltrates are present. Differential diagnosis includes pulmonary edema and/or pneumonia. Borderline cardiomegaly with mild pulmonary vascular prominence. No pleural effusion or pneumothorax.  IMPRESSION: Bilateral pulmonary infiltrates. Differential diagnosis includes pulmonary edema and/or node bilateral pneumonia.   Electronically Signed   By: Maisie Fushomas  Register   On: 04/26/2014 12:13   Mr Brain Wo Contrast  04/26/2014   CLINICAL DATA:  Loss of consciousness. Stroke. Chronic left lower extremity weakness. Left upper extremity weakness and and  EXAM: MRI HEAD WITHOUT CONTRAST  MRA HEAD WITHOUT CONTRAST  TECHNIQUE: Multiplanar, multiecho pulse sequences of  the brain and surrounding structures were obtained without intravenous contrast. Angiographic images of the head were obtained using MRA technique without contrast.  COMPARISON:  CT head from the same day.  FINDINGS: MRI HEAD FINDINGS  The diffusion-weighted images demonstrate an acute nonhemorrhagic infarct within the left ACA territory. No hemorrhage or mass lesion is present.  A remote right parietal lobe infarct is evident. Mild generalized atrophy is noted. There is ex vacuo dilation of the right lateral ventricle compatible with the prior infarct.  Right ICA and MCA conclusion is noted.  MRA HEAD FINDINGS  The MRA study is moderately degraded by patient motion. The right internal carotid artery is occluded. The left internal carotid artery is unremarkable. There is mild irregularity in the left A1 and M1 segments, likely exaggerated by patient motion. There is some flow in the right A1 segment. Terminal right ICA is reconstituted at the level of the right posterior communicating artery although no dominant artery is seen.  The right  vertebral artery is dominant to the left. The basilar artery is within normal limits. There is moderate signal loss at the origin of the left posterior cerebral artery. Both posterior cerebral arteries originate from the basilar tip.  IMPRESSION: 1. Acute nonhemorrhagic left ACA territory infarct. 2. Remote right parietal lobe infarct. 3. 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 These results will be called to the ordering clinician or representative by the Radiologist Assistant, and communication documented in the PACS or zVision Dashboard.   Electronically Signed   By: Gennette Pachris  Mattern M.D.   On: 04/26/2014 11:46   Mr Maxine GlennMra Head/brain Wo Cm  04/26/2014   CLINICAL DATA:  Loss of consciousness. Stroke. Chronic left lower extremity weakness. Left upper extremity weakness and and  EXAM: MRI HEAD WITHOUT CONTRAST  MRA HEAD WITHOUT CONTRAST  TECHNIQUE: Multiplanar, multiecho pulse sequences of the brain and surrounding structures were obtained without intravenous contrast. Angiographic images of the head were obtained using MRA technique without contrast.  COMPARISON:  CT head from the same day.  FINDINGS: MRI HEAD FINDINGS  The diffusion-weighted images demonstrate an acute nonhemorrhagic infarct within the left ACA territory. No hemorrhage or mass lesion is present.  A remote right parietal lobe infarct is evident. Mild generalized atrophy is noted. There is ex vacuo dilation of the right lateral ventricle compatible with the prior infarct.  Right ICA and MCA conclusion is noted.  MRA HEAD FINDINGS  The MRA study is moderately degraded by patient motion. The right internal carotid artery is occluded. The left internal carotid artery is unremarkable. There is mild irregularity in the left A1 and M1 segments, likely exaggerated by patient motion. There is some flow in the right A1 segment. Terminal right ICA is reconstituted at the level of the  right posterior communicating artery although no dominant artery is seen.  The right vertebral artery is dominant to the left. The basilar artery is within normal limits. There is moderate signal loss at the origin of the left posterior cerebral artery. Both posterior cerebral arteries originate from the basilar tip.  IMPRESSION: 1. Acute nonhemorrhagic left ACA territory infarct. 2. Remote right parietal lobe infarct. 3. 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 These results will be called to the ordering clinician or representative by the Radiologist Assistant, and communication documented in the PACS or zVision Dashboard.   Electronically Signed   By:  Gennette Pac M.D.   On: 04/26/2014 11:46    Assessment/Plan: Diagnosis: Left ACA distribution infarct causing poor balance as well as cognitive deficits 1. Does the need for close, 24 hr/day medical supervision in concert with the patient's rehab needs make it unreasonable for this patient to be served in a less intensive setting? Yes 2. Co-Morbidities requiring supervision/potential complications: Chronic left foot drop which patient relates to a back injury although he does have MRI evidence of previous right parietal lobe infarct 3. Due to bladder management, bowel management, safety, skin/wound care, disease management, medication administration and patient education, does the patient require 24 hr/day rehab nursing? Yes 4. Does the patient require coordinated care of a physician, rehab nurse, PT (1-2 hrs/day, 5 days/week), OT (1-2 hrs/day, 5 days/week) and SLP (0.51 hrs/day, 5 days/week) to address physical and functional deficits in the context of the above medical diagnosis(es)? Yes Addressing deficits in the following areas: balance, endurance, locomotion, strength, transferring, bowel/bladder control, bathing, dressing, feeding, grooming, toileting and cognition 5. Can  the patient actively participate in an intensive therapy program of at least 3 hrs of therapy per day at least 5 days per week? Yes 6. The potential for patient to make measurable gains while on inpatient rehab is good 7. Anticipated functional outcomes upon discharge from inpatient rehab are min assist  with PT, min assist with OT, supervision with SLP. 8. Estimated rehab length of stay to reach the above functional goals is: 14-21 days 9. Does the patient have adequate social supports to accommodate these discharge functional goals? Yes 10. Anticipated D/C setting: Home 11. Anticipated post D/C treatments: HH therapy 12. Overall Rehab/Functional Prognosis: good  RECOMMENDATIONS: This patient's condition is appropriate for continued rehabilitative care in the following setting: CIR Patient has agreed to participate in recommended program. Yes Note that insurance prior authorization may be required for reimbursement for recommended care.  Comment: Daughter states that there is family available to care for him at her home    04/28/2014

## 2014-04-28 NOTE — Progress Notes (Signed)
Patient arrived from [facility].  Patient oriented to room and unit.  Denies pain; vitals stable.  Will continue to monitor.

## 2014-04-28 NOTE — Interval H&P Note (Signed)
Nicholas [name] was admitted today to Inpatient Rehabilitation with the diagnosis of left ACA infarct.  The patient's history has been reviewed, patient examined, and there is no change in status.  Patient continues to be appropriate for intensive inpatient rehabilitation.  I have reviewed the patient's chart and labs.  Questions were answered to the patient's satisfaction.  Ranelle OysterZachary T Aries Townley 04/28/2014, 9:19 PM

## 2014-04-28 NOTE — Progress Notes (Signed)
Physical Therapy Treatment Patient Details Name: Nicholas [name] MRN: [id number] DOB: 11/01/1951 Today's Date: 04/28/2014    History of Present Illness Pt admitted with RLE weakness and fall with L ACA CVA.  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 LE weakness due to PVD    PT Comments    Patient appears to be progressing with mobility since last session of therapy. Patient able to stand x3 working on standing balance with use of RW. Patient also able to work on shifting weight and picking up LEs in prep for ambulation. Continue to recommend comprehensive inpatient rehab (CIR) for post-acute therapy needs.   Follow Up Recommendations  CIR;Supervision/Assistance - 24 hour     Equipment Recommendations       Recommendations for Other Services       Precautions / Restrictions Precautions Precautions: Fall    Mobility  Bed Mobility               General bed mobility comments: Patient sitting up in recliner before and after session  Transfers Overall transfer level: Needs assistance Equipment used: Rolling walker (2 wheeled) Transfers: Sit to/from Stand Sit to Stand: +2 physical assistance;Mod assist         General transfer comment: A to power up into standing, to ensure balance/weight shift and to block R LE with standing. Cues for proper technique and use of RW. Patient able to pick up right and left foot while standing with heavy lean to the right side. Patient with uncontrolled descents to recliner.   Ambulation/Gait                 Stairs            Wheelchair Mobility    Modified Rankin (Stroke Patients Only)       Balance     Sitting balance-Leahy Scale: Poor   Postural control: Right lateral lean   Standing balance-Leahy Scale: Poor                      Cognition Arousal/Alertness: Awake/alert Behavior During Therapy: WFL for tasks assessed/performed Overall Cognitive Status: Impaired/Different from  baseline Area of Impairment: Orientation;Memory;Safety/judgement;Awareness Orientation Level: Time   Memory: Decreased short-term memory Following Commands: Follows one step commands with increased time     Problem Solving: Decreased initiation;Requires verbal cues;Requires tactile cues;Difficulty sequencing;Slow processing      Exercises      General Comments        Pertinent Vitals/Pain Denied pain    Home Living                      Prior Function            PT Goals (current goals can now be found in the care plan section) Progress towards PT goals: Progressing toward goals    Frequency  Min 4X/week    PT Plan Current plan remains appropriate    Co-evaluation             End of Session Equipment Utilized During Treatment: Gait belt Activity Tolerance: Patient limited by fatigue Patient left: in chair;with call bell/phone within reach;with chair alarm set     Time: 1347-1411 PT Time Calculation (min): 24 min  Charges:  $Therapeutic Activity: 23-37 mins                    G Codes:      Adline PotterJulia Elizabeth Frederico Gerling 04/28/2014,  2:59 PM 04/28/2014 Adline PotterJulia Elizabeth Elton Catalano PTA [phone] pager [phone] office

## 2014-04-28 NOTE — Interval H&P Note (Signed)
Nicholas [name] was admitted today to Inpatient Rehabilitation with the diagnosis of embolic left ACA infarct.  The patient's history has been reviewed, patient examined, and there is no change in status.  Patient continues to be appropriate for intensive inpatient rehabilitation.  I have reviewed the patient's chart and labs.  Questions were answered to the patient's satisfaction.  Nicholas [name] 04/28/2014, 5:12 PM

## 2014-04-28 NOTE — Progress Notes (Signed)
Stroke Team Progress Note  HISTORY Nicholas [name] is an 63 y.o. male who lives alone at home. He went to sleep at baseline yesterday and woke up around 3 AM to go to the bathroom. Patient fell and possibly lost consciousness. Patient had earlier called his brother because he had some hematuria. By the time EMS reached patient was found to be on the floor. On way to ED he became lethargic and has remained lethargic since. Per EMS he has some left facial droop and left sided weakness. Currently he has no further facial droop. Initial CT head showed a left hypodensity and follow up MRI confirms this as a stroke in the left ACA territory. Currently he is very Drowsy able to follow commands. He is repeating "I had apples" and then perseverating on last words stated. Patient was not administered TPA secondary to being outside the window.  He was admitted to the [facility] for further evaluation and treatment.  SUBJECTIVE No family at bedside. Patient up in chair.  OBJECTIVE Most recent Vital Signs: Filed Vitals:   04/27/14 2035 04/28/14 0029 04/28/14 0423 04/28/14 0734  BP: 142/79 137/50 140/51 153/56  Pulse: 67 68 73 70  Temp: 98.7 F (37.1 C) 98.1 F (36.7 C) 98.6 F (37 C) 98.5 F (36.9 C)  TempSrc: Oral  Oral Oral  Resp: 16 18 18 17   Weight:      SpO2: 92% 95% 92% 94%   CBG (last 3)   Recent Labs  04/27/14 2146 04/28/14 0646 04/28/14 0714  GLUCAP 108* 92 97    IV Fluid Intake:     MEDICATIONS  . atorvastatin  10 mg Oral q1800  . bisacodyl  10 mg Rectal Once  . clopidogrel  75 mg Oral Q breakfast  . diltiazem  360 mg Oral Daily  . docusate sodium  100 mg Oral BID  . doxazosin  2 mg Oral Daily  . enoxaparin (LOVENOX) injection  40 mg Subcutaneous Q24H  . glipiZIDE  5 mg Oral Daily  . insulin aspart  0-9 Units Subcutaneous TID WC  . metoprolol  50 mg Oral BID   PRN:  diltiazem, ketorolac, senna-docusate  Diet:  Dysphagia 3 thin liquids Activity:  Up with assistance DVT  Prophylaxis:  lovenox  CLINICALLY SIGNIFICANT STUDIES Basic Metabolic Panel:   Recent Labs Lab 04/26/14 0350 04/26/14 0935  NA 140 140  K 3.8 3.9  CL 102 102  CO2 25 24  GLUCOSE 108* 93  BUN 8 7  CREATININE 0.92 0.97  CALCIUM 9.4 9.2   Liver Function Tests:   Recent Labs Lab 04/26/14 0350 04/26/14 0935  AST 28 30  ALT 28 28  ALKPHOS 77 80  BILITOT 0.7 0.8  PROT 7.3 6.9  ALBUMIN 3.9 3.8   CBC:   Recent Labs Lab 04/26/14 0350 04/26/14 0935  WBC 7.5 7.9  NEUTROABS  --  5.0  HGB 13.8 14.1  HCT 39.9 41.5  MCV 95.2 97.6  PLT 361 329   Coagulation: No results found for this basename: LABPROT, INR,  in the last 168 hours Cardiac Enzymes:   Recent Labs Lab 04/26/14 0935  CKTOTAL 487*  TROPONINI <0.30   Urinalysis:   Recent Labs Lab 04/26/14 0512  COLORURINE YELLOW  LABSPEC 1.019  PHURINE 7.5  GLUCOSEU NEGATIVE  HGBUR NEGATIVE  BILIRUBINUR NEGATIVE  KETONESUR NEGATIVE  PROTEINUR NEGATIVE  UROBILINOGEN 1.0  NITRITE NEGATIVE  LEUKOCYTESUR NEGATIVE   Lipid Panel    Component Value Date/Time   CHOL  112 04/27/2014 0643   TRIG 103 04/27/2014 0643   HDL 38* 04/27/2014 0643   CHOLHDL 2.9 04/27/2014 0643   VLDL 21 04/27/2014 0643   LDLCALC 53 04/27/2014 0643   HgbA1C  Lab Results  Component Value Date   HGBA1C 5.7* 04/27/2014    Urine Drug Screen:     Component Value Date/Time   LABOPIA NONE DETECTED 04/26/2014 0512   COCAINSCRNUR NONE DETECTED 04/26/2014 0512   LABBENZ NONE DETECTED 04/26/2014 0512   AMPHETMU NONE DETECTED 04/26/2014 0512   THCU NONE DETECTED 04/26/2014 0512   LABBARB NONE DETECTED 04/26/2014 0512    Alcohol Level:   Recent Labs Lab 04/26/14 0350  ETH <11    CT of the brain  04/26/2014  Focal loss of the left medial frontal gray white matter differentiation concerning for acute to subacute ischemia, left  anterior cerebral artery territory. MRI of the brain with diffusion-weighted sequences would be more sensitive. Remote right  frontoparietal infarcts.  MRI of the brain  04/26/2014   1. Acute nonhemorrhagic left ACA territory infarct. 2. Remote right parietal lobe infarct.   MRA of the brain  04/26/2014    Occlusion of the right internal carotid artery with reconstitution at the level of the posterior communicating artery.  2D Echocardiogram  EF 50-55% with no source of embolus. Aortic valve: There was mild to moderate stenosis.  Carotid Doppler  Bilateral: 1-39% ICA stenosis. Right: carotid waveforms consistent with distal occlusion. Bilateral: Vertebral artery flow is antegrade.   CXR  04/26/2014    Bilateral pulmonary infiltrates. Differential diagnosis includes pulmonary edema and/or node bilateral pneumonia.   EKG normal sinus rhythm   Therapy Recommendations CIR  Physical Exam   Awake,  , oriented to name, "May 2015" but doesn't know date. Able to follow simple and multi-step commands. Mildly dysarthric speech, able to repeat  Cranial Nerves:  II: blinks to threat bialterally, pupils equal, round, reactive to light and accommodation  III,IV, VI: ptosis not present, extra-ocular motions intact bilaterally  V,VII: face symmetric, facial light touch sensation normal bilaterally  VIII: hearing normal bilaterally   Motor:  Left: Upper extremity 5/5 right: Upper extremity 4+/5  LeftLower extremity 5/5 rightLower extremity 5-/5  Tone and bulk:normal tone throughout; no atrophy noted   Sensory: light touch intact throughout, bilaterally   Cerebellar:  normal finger-to-nose, normal heel-to-shin test    ASSESSMENT Nicholas [name] is a 63 y.o. male presenting with lethargy, confusion and weakness. No tPA given as outside the window. Imaging confirms Rt ACA infarct. Infarct felt to be thrombotic secondary right ICA occlusion, question possible A fib vs large artery atherosclerosis as etiology.  On ASA 325mg  prior to admission. Now on plavix 75mg  for secondary stroke prevention. Patient with resultant mild  right sided weakness. Stroke work up completed at this time.   HTN  HLD  DM, a1c 5.7  CAD  Hx R CEA   Hospital day # 2  TREATMENT/PLAN  Given large artery atheroclerosis, add Plavix to low dose aspirin for secondary stroke prevention x 3 months then one alone  Patient history suggests he has afib. If patient has documented atrial fibrillation, need to consider anticoagulation for secondary stroke prevention  Continue Lipitor 10mg   IP Rehab evaluation pending  Risk factor modification  Annie MainSHARON BIBY, MSN, RN, ANVP-BC, ANP-BC, GNP-BC [facility] Pager: [phone] 04/28/2014 10:50 AM  I have personally obtained a history, examined the patient, evaluated imaging results, and formulated the assessment and plan of  care. I agree with the above. Delia HeadyPramod Torianne Laflam, MD  To contact Stroke Continuity provider, please refer to [email or web address]. After hours, contact General Neurology

## 2014-04-29 ENCOUNTER — Inpatient Hospital Stay (HOSPITAL_COMMUNITY): Payer: Medicare Other | Admitting: Occupational Therapy

## 2014-04-29 ENCOUNTER — Inpatient Hospital Stay (HOSPITAL_COMMUNITY): Payer: Medicare Other | Admitting: Speech Pathology

## 2014-04-29 ENCOUNTER — Inpatient Hospital Stay (HOSPITAL_COMMUNITY): Payer: Medicare Other

## 2014-04-29 DIAGNOSIS — I69919 Unspecified symptoms and signs involving cognitive functions following unspecified cerebrovascular disease: Secondary | ICD-10-CM

## 2014-04-29 DIAGNOSIS — I6330 Cerebral infarction due to thrombosis of unspecified cerebral artery: Secondary | ICD-10-CM

## 2014-04-29 LAB — CBC WITH DIFFERENTIAL/PLATELET
BASOS ABS: 0 10*3/uL (ref 0.0–0.1)
Basophils Relative: 0 % (ref 0–1)
EOS PCT: 4 % (ref 0–5)
Eosinophils Absolute: 0.4 10*3/uL (ref 0.0–0.7)
HCT: 43.5 % (ref 39.0–52.0)
HEMOGLOBIN: 14.7 g/dL (ref 13.0–17.0)
LYMPHS ABS: 1.5 10*3/uL (ref 0.7–4.0)
Lymphocytes Relative: 16 % (ref 12–46)
MCH: 33.4 pg (ref 26.0–34.0)
MCHC: 33.8 g/dL (ref 30.0–36.0)
MCV: 98.9 fL (ref 78.0–100.0)
MONO ABS: 1 10*3/uL (ref 0.1–1.0)
MONOS PCT: 10 % (ref 3–12)
NEUTROS ABS: 6.9 10*3/uL (ref 1.7–7.7)
Neutrophils Relative %: 70 % (ref 43–77)
Platelets: 342 10*3/uL (ref 150–400)
RBC: 4.4 MIL/uL (ref 4.22–5.81)
RDW: 12.7 % (ref 11.5–15.5)
WBC: 9.8 10*3/uL (ref 4.0–10.5)

## 2014-04-29 LAB — COMPREHENSIVE METABOLIC PANEL
ALT: 23 U/L (ref 0–53)
AST: 25 U/L (ref 0–37)
Albumin: 3.5 g/dL (ref 3.5–5.2)
Alkaline Phosphatase: 88 U/L (ref 39–117)
BILIRUBIN TOTAL: 0.9 mg/dL (ref 0.3–1.2)
BUN: 10 mg/dL (ref 6–23)
CALCIUM: 9.3 mg/dL (ref 8.4–10.5)
CHLORIDE: 104 meq/L (ref 96–112)
CO2: 25 mEq/L (ref 19–32)
CREATININE: 0.97 mg/dL (ref 0.50–1.35)
GFR, EST NON AFRICAN AMERICAN: 87 mL/min — AB (ref >90)
GLUCOSE: 95 mg/dL (ref 70–99)
Potassium: 3.7 mEq/L (ref 3.7–5.3)
Sodium: 142 mEq/L (ref 137–147)
Total Protein: 6.9 g/dL (ref 6.0–8.3)

## 2014-04-29 LAB — GLUCOSE, CAPILLARY
GLUCOSE-CAPILLARY: 93 mg/dL (ref 70–99)
Glucose-Capillary: 106 mg/dL — ABNORMAL HIGH (ref 70–99)
Glucose-Capillary: 113 mg/dL — ABNORMAL HIGH (ref 70–99)
Glucose-Capillary: 127 mg/dL — ABNORMAL HIGH (ref 70–99)

## 2014-04-29 NOTE — Progress Notes (Signed)
Patient information reviewed and entered into eRehab system by Blossie Raffel, RN, CRRN, PPS Coordinator.  Information including medical coding and functional independence measure will be reviewed and updated through discharge.     Per nursing patient was given "Data Collection Information Summary for Patients in Inpatient Rehabilitation Facilities with attached "Privacy Act Statement-Health Care Records" upon admission.  

## 2014-04-29 NOTE — Progress Notes (Signed)
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 Patient lives alone independent with a cane prior to admission. Admitted 04/26/2014 and altered mental status.  MRI with acute nonhemorrhagic left ACA territory infarct as well as remote right parietal lobe infarct. 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 Physical occupational therapy evaluations completed 04/27/2014 recommendations of physical medicine rehabilitation consult.  Subjective/Complaints: Pt without new issues, states he had a BM today  Review of Systems - unable to obtain secondary to mental status  Objective: Vital Signs: Blood pressure 190/68, pulse 76, temperature 97.2 F (36.2 C), temperature source Oral, resp. rate 18, height 6' 0.05" (1.83 m), weight 106.7 kg (235 lb 3.7 oz), SpO2 95.00%. No results found. Results for orders placed during the hospital encounter of 04/28/14 (from the past 72 hour(s))  CBC     Status: None   Collection Time    04/28/14  7:26 PM      Result Value Ref Range   WBC 8.9  4.0 - 10.5 K/uL   RBC 4.52  4.22 - 5.81 MIL/uL   Hemoglobin 15.3  13.0 - 17.0 g/dL   HCT 44.9  39.0 - 52.0 %   MCV 99.3  78.0 - 100.0 fL   MCH 33.8  26.0 - 34.0 pg   MCHC 34.1  30.0 - 36.0 g/dL   RDW 12.7  11.5 - 15.5 %   Platelets 347  150 - 400 K/uL  CREATININE, SERUM     Status: Abnormal   Collection Time    04/28/14  7:26 PM      Result Value Ref Range   Creatinine, Ser 1.01  0.50 - 1.35 mg/dL   GFR calc non Af Amer 78 (*) >90 mL/min   GFR calc Af Amer >90  >90 mL/min   Comment: (NOTE)     The eGFR has been calculated using the CKD EPI  equation.     This calculation has not been validated in all clinical situations.     eGFR's persistently <90 mL/min signify possible Chronic Kidney     Disease.  GLUCOSE, CAPILLARY     Status: Abnormal   Collection Time    04/28/14  9:20 PM      Result Value Ref Range   Glucose-Capillary 157 (*) 70 - 99 mg/dL  CBC WITH DIFFERENTIAL     Status: None   Collection Time    04/29/14  5:03 AM      Result Value Ref Range   WBC 9.8  4.0 - 10.5 K/uL   RBC 4.40  4.22 - 5.81 MIL/uL   Hemoglobin 14.7  13.0 - 17.0 g/dL   HCT 43.5  39.0 - 52.0 %   MCV 98.9  78.0 - 100.0 fL   MCH 33.4  26.0 - 34.0 pg   MCHC 33.8  30.0 - 36.0 g/dL   RDW 12.7  11.5 - 15.5 %   Platelets 342  150 - 400 K/uL   Neutrophils Relative % 70  43 - 77 %   Neutro Abs 6.9  1.7 - 7.7 K/uL   Lymphocytes Relative 16  12 - 46 %   Lymphs Abs 1.5  0.7 - 4.0 K/uL   Monocytes Relative 10  3 - 12 %   Monocytes Absolute 1.0  0.1 - 1.0 K/uL   Eosinophils Relative 4  0 - 5 %   Eosinophils Absolute 0.4  0.0 - 0.7 K/uL   Basophils Relative 0  0 - 1 %   Basophils Absolute 0.0  0.0 - 0.1 K/uL  COMPREHENSIVE METABOLIC PANEL     Status: Abnormal   Collection Time    04/29/14  5:03 AM      Result Value Ref Range   Sodium 142  137 - 147 mEq/L   Potassium 3.7  3.7 - 5.3 mEq/L   Chloride 104  96 - 112 mEq/L   CO2 25  19 - 32 mEq/L   Glucose, Bld 95  70 - 99 mg/dL   BUN 10  6 - 23 mg/dL   Creatinine, Ser 0.97  0.50 - 1.35 mg/dL   Calcium 9.3  8.4 - 10.5 mg/dL   Total Protein 6.9  6.0 - 8.3 g/dL   Albumin 3.5  3.5 - 5.2 g/dL   AST 25  0 - 37 U/L   ALT 23  0 - 53 U/L   Alkaline Phosphatase 88  39 - 117 U/L   Total Bilirubin 0.9  0.3 - 1.2 mg/dL   GFR calc non Af Amer 87 (*) >90 mL/min   GFR calc Af Amer >90  >90 mL/min   Comment: (NOTE)     The eGFR has been calculated using the CKD EPI equation.     This calculation has not been validated in all clinical situations.     eGFR's persistently <90 mL/min signify possible Chronic  Kidney     Disease.      Head: Normocephalic.  Eyes:  Pupils reactive to light  Neck: Normal range of motion. Neck supple. No thyromegaly present.  Cardiovascular: Normal rate and regular rhythm.  Respiratory: Effort normal and breath sounds normal. No respiratory distress.  GI: Soft. Bowel sounds are normal. He exhibits no distension.  Neurological:  Flat affect.Oriented to person,place,age and DOB.Follows simple commands .Fair awareness of deficits  Skin: Skin is warm and dry.  Scar right side of neck  motor strength is 5/5 bilateral deltoid, bicep, tricep, grip  Minimal dysmetria bilaterally finger-nose-finger  Right lower extremity 4/5 hip flexor knee extensor ankle dorsi flexion plantar flexor  Left extremity 4/5 in the left hip flexor knee extensor 3 minus ankle dorsiflexor plantar flexor  Sensation intact to light touch R. upper and lower limbs  Patient is not oriented to Month day or date   Assessment/Plan: 1. Functional deficits secondary to embolic left ACA infarct which require 3+ hours per day of interdisciplinary therapy in a comprehensive inpatient rehab setting. Physiatrist is providing close team supervision and 24 hour management of active medical problems listed below. Physiatrist and rehab team continue to assess barriers to discharge/monitor patient progress toward functional and medical goals. FIM:                   Comprehension Comprehension Mode: Auditory Comprehension: 5-Understands basic 90% of the time/requires cueing < 10% of the time  Expression Expression Mode: Verbal Expression: 5-Expresses basic needs/ideas: With extra time/assistive device  Social Interaction Social Interaction: 5-Interacts appropriately 90% of the time - Needs monitoring or encouragement for participation or interaction.  Problem Solving Problem Solving: 3-Solves basic 50 - 74% of the time/requires cueing 25 - 49% of the time    Medical Problem List and Plan:   1. Functional deficits secondary to left  ACA distribution infarct suspect embolic secondary to unclear source  2. DVT Prophylaxis/Anticoagulation: Subcutaneous Lovenox. Monitor platelet counts and any signs of bleeding  3. Pain Management: Tylenol as needed  4. Hypertension. Cardizem 360 mg daily, Cardura 2 mg daily, Lopressor 50 mg twice a day. Monitor with increased mobility  5. Neuropsych: This patient is not yet capable of making decisions on his own behalf.  6. Diabetes mellitus with peripheral neuropathy. Hemoglobin A1c 5.7. Glipizide 5 mg daily. Check blood sugars a.c. and at bedtime. Provide diabetic teaching.  7. Hyperlipidemia. Lipitor  8. CAD with stenting. Cardiac enzymes negative. Continue aspirin Plavix therapy. No chest pain or shortness of breath.  9. History of BPH. Check PVRs x3    LOS (Days) 1 A FACE TO FACE EVALUATION WAS PERFORMED  Charlett Blake 04/29/2014, 7:19 AM

## 2014-04-29 NOTE — Care Management Note (Signed)
Inpatient Rehabilitation Center Individual Statement of Services  Patient Name:  Nicholas [name]  Date:  04/29/2014  Welcome to the Inpatient Rehabilitation Center.  Our goal is to provide you with an individualized program based on your diagnosis and situation, designed to meet your specific needs.  With this comprehensive rehabilitation program, you will be expected to participate in at least 3 hours of rehabilitation therapies Monday-Friday, with modified therapy programming on the weekends.  Your rehabilitation program will include the following services:  Physical Therapy (PT), Occupational Therapy (OT), Speech Therapy (ST), 24 hour per day rehabilitation nursing, Neuropsychology, Case Management (Social Worker), Rehabilitation Medicine, Nutrition Services and Pharmacy Services  Weekly team conferences will be held on Wednesday to discuss your progress.  Your Social Worker will talk with you frequently to get your input and to update you on team discussions.  Team conferences with you and your family in attendance may also be held.  Expected length of stay: 21-24 days Overall anticipated outcome: supervision with cues and min for home management  Depending on your progress and recovery, your program may change. Your Social Worker will coordinate services and will keep you informed of any changes. Your Social Worker's name and contact numbers are listed  below.  The following services may also be recommended but are not provided by the Inpatient Rehabilitation Center:  Home Health Rehabiltiation Services  Outpatient Rehabilitation Services   Arrangements will be made to provide these services after discharge if needed.  Arrangements include referral to agencies that provide these services.  Your insurance has been verified to be:  Medicare Your primary doctor is:  Dr Ursula BeathJennifer Turnbull  Pertinent information will be shared with your doctor and your insurance company.  Social Worker:   Dossie DerBecky Lindberg [place], [place] [phone] or [phone]) [phone]  Information discussed with and copy given to patient by: Lucy Chrisebecca G Ivannia Willhelm, 04/29/2014, 12:21 PM

## 2014-04-29 NOTE — Progress Notes (Signed)
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 Patient lives alone independent with a cane prior to admission. Admitted 04/26/2014 and altered mental status.  MRI with acute nonhemorrhagic left ACA territory infarct as well as remote right parietal lobe infarct. 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 Physical occupational therapy evaluations completed 04/27/2014 recommendations of physical medicine rehabilitation consult.  Patient feels his biggest problem after stroke is with his memory.  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  Musculoskeletal: Positive for falls.  Neurological: Positive for weakness.  All other systems reviewed and are negative.   Past Medical History   Diagnosis  Date   .  Hypertension    .  Hyperlipidemia    .  Type 2 diabetes mellitus    .  History of myocardial infarction      1993-- NON-Q WAVE S/P PCI TO LAD   .  History of CVA (cerebrovascular accident)      1993--- NO RESIDUALS   .  Hydrocele, left    .  BPH (benign prostatic hypertrophy)      HX RETENTION   .  PSVT (paroxysmal supraventricular tachycardia)    .  Coronary artery disease  CARDIOLOGIST-- DR Johney Frame     S/P PCI TO LAD 1993   .  Carotid artery occlusion      S/P RIGHT CEA   .  PVD (peripheral  vascular disease)      S/P LEFT FEM-POP   .  Left carotid artery stenosis      MILD   .  History of head injury      CONCUSSION-- NO RESIDUAL   .  Right leg weakness      USES CANE-- SECONDARY TO PVD   .  At high risk for falls    .  Wears glasses    .  Myocardial infarction    .  GERD (gastroesophageal reflux disease)     Past Surgical History   Procedure  Laterality  Date   .  Carotid endarterectomy  Right  05-31-2003   .  Femoral-popliteal bypass graft  Left  01-19-2003   .  Right hydrocelectomy   05-31-2003   .  Coronary angioplasty   1993     PCI TO LAD   .  Cardiac catheterization   01-18-2003 DR NISHAN     NON-OBSTRUCTIVE CAD/ PREVIOIS ANGIOPLASTY SITE WIDELY PATENT   .  Cardiovascular stress test   2007     SMALL ANTERO-APICAL SCAR/ NO ISCHEMIA   .  Transthoracic echocardiogram   08-24-2010     EF 55%/ MILD AORTIC INSUFFICENCY/ MODERATE LVH   .  Hydrocele excision  Left  10/27/2013     Procedure: HYDROCELECTOMY ADULT; Surgeon: Lindaann Slough, MD; Location[facility]; Service: Urology; Laterality: Left;    Family History   Problem  Relation  Age of Onset   .  Stroke  Mother    .  Hypertension  Mother    .  Hypertension  Father     Social History: reports that he quit smoking about 20 years ago. His smoking use included Cigarettes. He smoked 0.00 packs per day for 0 years. He has never used smokeless tobacco. He reports that he does not drink alcohol or use illicit drugs.  Allergies: No Known Allergies  Medications Prior to Admission   Medication  Sig  Dispense  Refill   .  aspirin 325 MG EC tablet  Take 325 mg by mouth daily.     Marland Kitchen  diltiazem (TAZTIA XT) 360 MG 24 hr capsule  Take 360 mg by mouth daily.     Marland Kitchen  docusate sodium (COLACE) 100 MG capsule  Take 100 mg by mouth 2 (two) times daily.     Marland Kitchen  doxazosin (CARDURA) 2 MG tablet  Take 2 mg by mouth daily.     Marland Kitchen  glipiZIDE (GLUCOTROL XL) 5 MG 24 hr tablet  Take 5 mg by mouth daily.     .  metFORMIN  (GLUCOPHAGE) 1000 MG tablet  Take 1,000 mg by mouth 2 (two) times daily with a meal.     .  metoprolol (LOPRESSOR) 50 MG tablet  Take 1 tablet (50 mg total) by mouth 2 (two) times daily.  60 tablet  3   .  rosuvastatin (CRESTOR) 10 MG tablet  Take 10 mg by mouth daily.      Home:  Home Living  Family/patient expects to be discharged to:: Inpatient rehab  Living Arrangements: Alone  Available Help at Discharge: Family;Available 24 hours/day  Type of Home: Apartment  Home Access: Level entry  Home Layout: One level  Home Equipment: None  Lives With: Alone  Functional History:  Prior Function  Level of Independence: Independent with assistive device(s)  Comments: Pt does not drive - reports dtr drives him  Functional Status:  Mobility:  Bed Mobility  Overal bed mobility: Needs Assistance  Bed Mobility: Supine to Sit;Sit to Supine  Rolling: Min assist  Supine to sit: Total assist  Sit to supine: Max assist  General bed mobility comments: Pt required assist with all aspects of moving sit to supine and supine to sit.  Transfers  Overall transfer level: Needs assistance  Transfers: Sit to/from Stand  Sit to Stand: Total assist (unable to achieve with +1 assist)    ADL:  ADL  Overall ADL's : Needs assistance/impaired  Eating/Feeding: Moderate assistance;Bed level  Grooming: Wash/dry hands;Wash/dry face;Moderate assistance;Bed level  Upper Body Bathing: Moderate assistance;Bed level  Lower Body Bathing: Total assistance;Bed level  Upper Body Dressing : Total assistance;Bed level  Lower Body Dressing: Total assistance;Bed level  Toilet Transfer: Total assistance (unable)  Toileting- Clothing Manipulation and Hygiene: Total assistance;Bed level  Functional mobility during ADLs: Maximal assistance (to move and to sit EOB)  General ADL Comments: Pt required max A to sit EOB. Pt with flat affect and only minimally particiaptory. Pt's daughter and granddaughter present - unsure if pt  self conscious and not engaging unless max prompts provided. RN reports pt's affect changed significantly after PT - questionable depression after realizing extent of deficits.  Cognition:  Cognition  Overall Cognitive Status: Impaired/Different from baseline  Arousal/Alertness: Awake/alert  Orientation Level: Oriented to person;Oriented to place;Disoriented to time;Disoriented to situation  Attention: Focused;Sustained  Focused Attention: Appears intact  Sustained Attention: Impaired  Sustained Attention Impairment: Verbal complex;Functional complex  Memory: Impaired  Memory Impairment: Storage deficit;Retrieval  deficit  Awareness: Impaired  Awareness Impairment: Intellectual impairment;Emergent impairment;Anticipatory impairment  Problem Solving: Impaired  Problem Solving Impairment: Verbal basic;Functional basic  Executive Function: Reasoning  Behaviors: (flat affect)  Safety/Judgment: Impaired  Cognition  Arousal/Alertness: Awake/alert  Behavior During Therapy: Flat affect  Overall Cognitive Status: Impaired/Different from baseline  Area of Impairment: Attention;Memory;Following commands;Safety/judgement;Awareness;Problem solving  Orientation Level: Time  Current Attention Level: Sustained  Memory: Decreased short-term memory  Following Commands: Follows one step commands with increased time  Safety/Judgement: Decreased awareness of safety;Decreased awareness of deficits  Awareness: Intellectual  Problem Solving: Slow processing;Decreased initiation;Difficulty sequencing;Requires verbal cues;Requires tactile cues  General Comments: Pt with delayed processing. Pt with very little output with OT - question if due to embarrassment with family in room. RN reports pt's demeanor changed after PT  Blood pressure 140/51, pulse 73, temperature 98.6 F (37 C), temperature source Oral, resp. rate 18, weight 106.686 kg (235 lb 3.2 oz), SpO2 92.00%.  Physical Exam  HENT:  Head:  Normocephalic.  Eyes:  Pupils reactive to light  Neck: Normal range of motion. Neck supple. No thyromegaly present.  Cardiovascular: Normal rate and regular rhythm.  Respiratory: Effort normal and breath sounds normal. No respiratory distress.  GI: Soft. Bowel sounds are normal. He exhibits no distension.  Neurological:  Flat affect.Oriented to person,place,age and DOB.Follows simple commands .Fair awareness of deficits  Skin: Skin is warm and dry.  Scar right side of neck  motor strength is 5/5 bilateral deltoid, bicep, tricep, grip  Minimal dysmetria bilaterally finger-nose-finger  Right lower extremity 4/5 hip flexor knee extensor ankle dorsi flexion plantar flexor  Left extremity 4/5 in the left hip flexor knee extensor 3 minus ankle dorsiflexor plantar flexor  Sensation intact to light touch R. upper and lower limbs  Patient is not oriented to Month day or date  Results for orders placed during the hospital encounter of 04/26/14 (from the past 24 hour(s))   GLUCOSE, CAPILLARY Status: None    Collection Time    04/27/14 7:44 AM   Result  Value  Ref Range    Glucose-Capillary  87  70 - 99 mg/dL    Comment 1  Documented in Chart     Comment 2  Notify RN    GLUCOSE, CAPILLARY Status: Abnormal    Collection Time    04/27/14 11:46 AM   Result  Value  Ref Range    Glucose-Capillary  105 (*)  70 - 99 mg/dL    Comment 1  Documented in Chart     Comment 2  Notify RN    GLUCOSE, CAPILLARY Status: Abnormal    Collection Time    04/27/14 4:38 PM   Result  Value  Ref Range    Glucose-Capillary  185 (*)  70 - 99 mg/dL   GLUCOSE, CAPILLARY Status: Abnormal    Collection Time    04/27/14 9:24 PM   Result  Value  Ref Range    Glucose-Capillary  58 (*)  70 - 99 mg/dL   GLUCOSE, CAPILLARY Status: Abnormal    Collection Time    04/27/14 9:46 PM   Result  Value  Ref Range    Glucose-Capillary  108 (*)  70 - 99 mg/dL   GLUCOSE, CAPILLARY Status: None    Collection Time    04/28/14 6:46  AM   Result  Value  Ref Range    Glucose-Capillary  92  70 - 99 mg/dL   GLUCOSE, CAPILLARY Status: None    Collection Time  04/28/14 7:14 AM   Result  Value  Ref Range    Glucose-Capillary  97  70 - 99 mg/dL    Dg Chest 2 View  8/11/91475/18/2015 CLINICAL DATA: Stroke. EXAM: CHEST 2 VIEW COMPARISON: No prior. FINDINGS: Bilateral pulmonary infiltrates are present. Differential diagnosis includes pulmonary edema and/or pneumonia. Borderline cardiomegaly with mild pulmonary vascular prominence. No pleural effusion or pneumothorax. IMPRESSION: Bilateral pulmonary infiltrates. Differential diagnosis includes pulmonary edema and/or node bilateral pneumonia. Electronically Signed By: Maisie Fushomas Register On: 04/26/2014 12:13  Mr Brain Wo Contrast  04/26/2014 CLINICAL DATA: Loss of consciousness. Stroke. Chronic left lower extremity weakness. Left upper extremity weakness and and EXAM: MRI HEAD WITHOUT CONTRAST MRA HEAD WITHOUT CONTRAST TECHNIQUE: Multiplanar, multiecho pulse sequences of the brain and surrounding structures were obtained without intravenous contrast. Angiographic images of the head were obtained using MRA technique without contrast. COMPARISON: CT head from the same day. FINDINGS: MRI HEAD FINDINGS The diffusion-weighted images demonstrate an acute nonhemorrhagic infarct within the left ACA territory. No hemorrhage or mass lesion is present. A remote right parietal lobe infarct is evident. Mild generalized atrophy is noted. There is ex vacuo dilation of the right lateral ventricle compatible with the prior infarct. Right ICA and MCA conclusion is noted. MRA HEAD FINDINGS The MRA study is moderately degraded by patient motion. The right internal carotid artery is occluded. The left internal carotid artery is unremarkable. There is mild irregularity in the left A1 and M1 segments, likely exaggerated by patient motion. There is some flow in the right A1 segment. Terminal right ICA is reconstituted at the level  of the right posterior communicating artery although no dominant artery is seen. The right vertebral artery is dominant to the left. The basilar artery is within normal limits. There is moderate signal loss at the origin of the left posterior cerebral artery. Both posterior cerebral arteries originate from the basilar tip. IMPRESSION: 1. Acute nonhemorrhagic left ACA territory infarct. 2. Remote right parietal lobe infarct. 3. 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 These results will be called to the ordering clinician or representative by the Radiologist Assistant, and communication documented in the PACS or zVision Dashboard. Electronically Signed By: Gennette Pachris Mattern M.D. On: 04/26/2014 11:46  Mr Maxine GlennMra Head/brain Wo Cm  04/26/2014 CLINICAL DATA: Loss of consciousness. Stroke. Chronic left lower extremity weakness. Left upper extremity weakness and and EXAM: MRI HEAD WITHOUT CONTRAST MRA HEAD WITHOUT CONTRAST TECHNIQUE: Multiplanar, multiecho pulse sequences of the brain and surrounding structures were obtained without intravenous contrast. Angiographic images of the head were obtained using MRA technique without contrast. COMPARISON: CT head from the same day. FINDINGS: MRI HEAD FINDINGS The diffusion-weighted images demonstrate an acute nonhemorrhagic infarct within the left ACA territory. No hemorrhage or mass lesion is present. A remote right parietal lobe infarct is evident. Mild generalized atrophy is noted. There is ex vacuo dilation of the right lateral ventricle compatible with the prior infarct. Right ICA and MCA conclusion is noted. MRA HEAD FINDINGS The MRA study is moderately degraded by patient motion. The right internal carotid artery is occluded. The left internal carotid artery is unremarkable. There is mild irregularity in the left A1 and M1 segments, likely exaggerated by patient motion. There is some flow in the right  A1 segment. Terminal right ICA is reconstituted at the level of the right posterior communicating artery although no dominant artery is seen. The right vertebral artery is dominant to the left. The  basilar artery is within normal limits. There is moderate signal loss at the origin of the left posterior cerebral artery. Both posterior cerebral arteries originate from the basilar tip. IMPRESSION: 1. Acute nonhemorrhagic left ACA territory infarct. 2. Remote right parietal lobe infarct. 3. 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 These results will be called to the ordering clinician or representative by the Radiologist Assistant, and communication documented in the PACS or zVision Dashboard. Electronically Signed By: Gennette Pac M.D. On: 04/26/2014 11:46   Assessment/Plan:  Diagnosis: Left ACA distribution infarct causing poor balance as well as cognitive deficits  1. Does the need for close, 24 hr/day medical supervision in concert with the patient's rehab needs make it unreasonable for this patient to be served in a less intensive setting? Yes 2. Co-Morbidities requiring supervision/potential complications: Chronic left foot drop which patient relates to a back injury although he does have MRI evidence of previous right parietal lobe infarct 3. Due to bladder management, bowel management, safety, skin/wound care, disease management, medication administration and patient education, does the patient require 24 hr/day rehab nursing? Yes 4. Does the patient require coordinated care of a physician, rehab nurse, PT (1-2 hrs/day, 5 days/week), OT (1-2 hrs/day, 5 days/week) and SLP (0.51 hrs/day, 5 days/week) to address physical and functional deficits in the context of the above medical diagnosis(es)? Yes Addressing deficits in the following areas: balance, endurance, locomotion, strength, transferring, bowel/bladder control, bathing,  dressing, feeding, grooming, toileting and cognition 5. Can the patient actively participate in an intensive therapy program of at least 3 hrs of therapy per day at least 5 days per week? Yes 6. The potential for patient to make measurable gains while on inpatient rehab is good 7. Anticipated functional outcomes upon discharge from inpatient rehab are min assist with PT, min assist with OT, supervision with SLP. 8. Estimated rehab length of stay to reach the above functional goals is: 14-21 days 9. Does the patient have adequate social supports to accommodate these discharge functional goals? Yes 10. Anticipated D/C setting: Home 11. Anticipated post D/C treatments: HH therapy 12. Overall Rehab/Functional Prognosis: good RECOMMENDATIONS:  This patient's condition is appropriate for continued rehabilitative care in the following setting: CIR  Patient has agreed to participate in recommended program. Yes  Note that insurance prior authorization may be required for reimbursement for recommended care.  Comment: Daughter states that there is family available to care for him at her home  04/28/2014  Revision History...      Date/Time User Action    04/28/2014 1:03 PM Erick Colace, MD Sign    04/28/2014 7:54 AM Charlton Amor, PA-C [place]   View Details Report    Routing History.Marland KitchenMarland Kitchen

## 2014-04-29 NOTE — Evaluation (Signed)
Speech Language Pathology Assessment and Plan  Patient Details  Name: Nicholas [name] MRN: [id number] Date of Birth: [birth year]  SLP Diagnosis: Cognitive Impairments;Speech and Language deficits;Dysphagia  Rehab Potential: Fair ELOS: 21-24 days   Today's Date: 04/29/2014 Time: 1500-1556 Time Calculation (min): 56 min  Problem List:  Patient Active Problem List   Diagnosis Date Noted  . CVA (cerebral infarction) 04/28/2014  . Stroke 04/26/2014  . Acute encephalopathy 04/26/2014  . Abdominal pain 04/26/2014  . PSVT 09/15/2010  . AORTIC INSUFFICIENCY 08/09/2010  . CLAUDICATION 08/09/2010  . PALPITATIONS 08/09/2010  . PALPITATIONS, HX OF 08/09/2010  . HYPERLIPIDEMIA-MIXED 08/08/2010  . TOBACCO ABUSE 08/08/2010  . HYPERTENSION, UNSPECIFIED 08/08/2010  . CAD, NATIVE VESSEL 08/08/2010  . CAROTID ARTERY DISEASE 08/08/2010   Past Medical History:  Past Medical History  Diagnosis Date  . Hypertension   . Hyperlipidemia   . Type 2 diabetes mellitus   . History of myocardial infarction     1993--  NON-Q WAVE  S/P PCI TO LAD  . History of CVA (cerebrovascular accident)     1993---  NO RESIDUALS  . Hydrocele, left   . BPH (benign prostatic hypertrophy)     HX RETENTION  . PSVT (paroxysmal supraventricular tachycardia)   . Coronary artery disease CARDIOLOGIST--  DR Rayann Heman    S/P  PCI TO LAD 1993  . Carotid artery occlusion     S/P RIGHT CEA  . PVD (peripheral vascular disease)     S/P LEFT FEM-POP  . Left carotid artery stenosis     MILD  . History of head injury     CONCUSSION--  NO RESIDUAL  . Right leg weakness     USES CANE--  SECONDARY TO PVD  . At high risk for falls   . Wears glasses   . Myocardial infarction   . GERD (gastroesophageal reflux disease)    Past Surgical History:  Past Surgical History  Procedure Laterality Date  . Carotid endarterectomy Right 05-31-2003  . Femoral-popliteal bypass graft Left 01-19-2003  . Right hydrocelectomy  05-31-2003  .  Coronary angioplasty  1993    PCI TO LAD  . Cardiac catheterization  01-18-2003   DR NISHAN    NON-OBSTRUCTIVE CAD/  PREVIOIS ANGIOPLASTY SITE WIDELY PATENT  . Cardiovascular stress test  2007    SMALL ANTERO-APICAL SCAR/  NO ISCHEMIA  . Transthoracic echocardiogram  08-24-2010    EF 55%/  MILD AORTIC INSUFFICENCY/  MODERATE LVH  . 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 Patient lives alone independent with a cane prior to admission. Admitted 04/26/2014 and altered mental status. MRI with acute nonhemorrhagic left ACA territory infarct as well as remote right parietal lobe infarct. MRA of the head with occlusion of the internal carotid artery with reconstitution at the level the posterior to indicating artery.echo with EF 55% without embolism.Carotid doppler without ICA stenosis. Cardiac enzymes negative. Neurology consulted placed on plavix/aspirin for CVA prophylaxis and subcutaneous Lovenox for DVT prophylaxis. Patient is maintained on a mechanical soft diet. Physical occupational therapy evaluations completed 04/27/2014 recommendations  of physical medicine rehabilitation consult. Admitted for comprehensive rehabilitation program 04/29/14.  Orders received; Bedside Swallow and Cognitive-Linguistic Evaluations completed.  Patient continues to demonstrate primary cognitive deficits, including decreased initiation of verbal interaction, sustained attention, decreased ability for functional problem solving and limited awareness of deficits  and their impact. Patient's swallow function is WFL, but appears to be impacted by his cognition.  As a result, recommend diet upgrade to regular textures, continuation of thin liquids and intermittent supervision.  Patient requires skilled SLP services to address cognitive-linguistic deficits to maximize functional independence prior to discharge home with 24/7 supervision.   Skilled Therapeutic Interventions          Cognitive-linguistic evaluation completed with results and recommendations reviewed with patient.     SLP Assessment  Patient will need skilled Speech Lanaguage Pathology Services during CIR admission    Recommendations  Diet Recommendations: Regular;Thin liquid Liquid Administration via: Cup;Straw Medication Administration: Whole meds with puree Supervision: Patient able to self feed;Staff to assist with self feeding;Intermittent supervision to cue for compensatory strategies Compensations: Slow rate;Small sips/bites Postural Changes and/or Swallow Maneuvers: Seated upright 90 degrees;Upright 30-60 min after meal Oral Care Recommendations: Oral care BID Patient destination: Home Follow up Recommendations: 24 hour supervision/assistance Equipment Recommended: None recommended by SLP    SLP Frequency 5 out of 7 days   SLP Treatment/Interventions Cognitive remediation/compensation;Cueing hierarchy;Environmental controls;Functional tasks;Internal/external aids;Patient/family education;Speech/Language facilitation;Therapeutic Activities    Pain Pain Assessment Pain Assessment: No/denies pain Prior Functioning Cognitive/Linguistic Baseline: Baseline deficits (per acute; no family to confirm ) Baseline deficit details: mild memory Type of Home: Apartment  Lives With: Daughter (upon discharge) Available Help at Discharge: Family Education: high school  Vocation: On disability  Short Term Goals: Week 1: SLP Short Term Goal 1 (Week 1): Patient will demonstrate basic problem  solving during self-care tasks with Min verbal cues SLP Short Term Goal 2 (Week 1): Patient will sustain attention to basic self-care tasks for 5 minutes with Mod cues SLP Short Term Goal 3 (Week 1): Patient will label 3 deficits with Mod question cues SLP Short Term Goal 4 (Week 1): Patient will tolerate regula textures and thin liquids with Mod I  SLP Short Term Goal 5 (Week 1): Patient will follow multi-step commands during functional tasks with Min vebral cues  See FIM for current functional status Refer to Care Plan for Long Term Goals  Recommendations for other services: None  Discharge Criteria: Patient will be discharged from SLP if patient refuses treatment 3 consecutive times without medical reason, if treatment goals not met, if there is a change in medical status, if patient makes no progress towards goals or if patient is discharged from hospital.  The above assessment, treatment plan, treatment alternatives and goals were discussed and mutually agreed upon: by patient  Gunnar Fusi, M.A., CCC-SLP [place] 04/29/2014, 4:29 PM

## 2014-04-29 NOTE — Evaluation (Addendum)
Physical Therapy Assessment and Plan  Patient Details  Name: Nicholas [name] MRN: [id number] Date of Birth: 04-Jun-1951  PT Diagnosis: Abnormal posture, Difficulty walking, Hemiparesis dominant, Impaired cognition and Muscle weakness Rehab Potential: Good ELOS: 21-24 days   Today's Date: 04/29/2014 Time: 0802-0902 Time Calculation (min): 60 min  Problem List:  Patient Active Problem List   Diagnosis Date Noted  . CVA (cerebral infarction) 04/28/2014  . Stroke 04/26/2014  . Acute encephalopathy 04/26/2014  . Abdominal pain 04/26/2014  . PSVT 09/15/2010  . AORTIC INSUFFICIENCY 08/09/2010  . CLAUDICATION 08/09/2010  . PALPITATIONS 08/09/2010  . PALPITATIONS, HX OF 08/09/2010  . HYPERLIPIDEMIA-MIXED 08/08/2010  . TOBACCO ABUSE 08/08/2010  . HYPERTENSION, UNSPECIFIED 08/08/2010  . CAD, NATIVE VESSEL 08/08/2010  . CAROTID ARTERY DISEASE 08/08/2010    Past Medical History:  Past Medical History  Diagnosis Date  . Hypertension   . Hyperlipidemia   . Type 2 diabetes mellitus   . History of myocardial infarction     1993--  NON-Q WAVE  S/P PCI TO LAD  . History of CVA (cerebrovascular accident)     1993---  NO RESIDUALS  . Hydrocele, left   . BPH (benign prostatic hypertrophy)     HX RETENTION  . PSVT (paroxysmal supraventricular tachycardia)   . Coronary artery disease CARDIOLOGIST--  DR Rayann Heman    S/P  PCI TO LAD 1993  . Carotid artery occlusion     S/P RIGHT CEA  . PVD (peripheral vascular disease)     S/P LEFT FEM-POP  . Left carotid artery stenosis     MILD  . History of head injury     CONCUSSION--  NO RESIDUAL  . Right leg weakness     USES CANE--  SECONDARY TO PVD  . At high risk for falls   . Wears glasses   . Myocardial infarction   . GERD (gastroesophageal reflux disease)    Past Surgical History:  Past Surgical History  Procedure Laterality Date  . Carotid endarterectomy Right 05-31-2003  . Femoral-popliteal bypass graft Left 01-19-2003  . Right  hydrocelectomy  05-31-2003  . Coronary angioplasty  1993    PCI TO LAD  . Cardiac catheterization  01-18-2003   DR NISHAN    NON-OBSTRUCTIVE CAD/  PREVIOIS ANGIOPLASTY SITE WIDELY PATENT  . Cardiovascular stress test  2007    SMALL ANTERO-APICAL SCAR/  NO ISCHEMIA  . Transthoracic echocardiogram  08-24-2010    EF 55%/  MILD AORTIC INSUFFICENCY/  MODERATE LVH  . Hydrocele excision Left 10/27/2013    Procedure: HYDROCELECTOMY ADULT;  Surgeon: Hanley Ben, MD;  Location: [facility];  Service: Urology;  Laterality: Left;    Assessment & Plan Clinical Impression:  63 y.o.right handed male with history of hypertension, diabetes mellitus peripheral neuropathy, CVA 1993 with left leg weakness,CAD with stenting. Patient lives alone independent with a cane prior to admission. Admitted 04/26/2014 and altered mental status. MRI with acute nonhemorrhagic left ACA territory infarct as well as remote right parietal lobe infarct. MRA of the head with occlusion of the internal carotid artery with reconstitution at the level the posterior to indicating artery.echo with EF 55% without embolism.Carotid doppler without ICA stenosis.Cardiac enzymes negative. Patient transferred to CIR on 04/28/2014 .   Patient currently requires total +2 assist with mobility secondary to muscle weakness, muscle joint tightness and muscle paralysis, impaired timing and sequencing, unbalanced muscle activation, motor apraxia, decreased coordination and decreased motor planning, decreased midline orientation and decreased motor planning and decreased  awareness, decreased problem solving, decreased memory and delayed processing.  Prior to hospitalization, patient was modified independent  with mobility and lived with Daughter (discharge to daughter's house) in a Apartment home.  Home access is  Level entry with small threshold.  Patient will benefit from skilled PT intervention to maximize safe functional mobility,  minimize fall risk and decrease caregiver burden for planned discharge home with 24 hour supervision.  Anticipate patient will benefit from follow up HH at discharge.  PT - End of Session Activity Tolerance: Tolerates < 10 min activity, no significant change in vital signs Endurance Deficit: Yes PT Assessment Rehab Potential: Good Barriers to Discharge: Decreased caregiver support (daughter and brother work) PT Patient demonstrates impairments in the following area(s): Balance;Endurance;Motor;Safety;Behavior PT Transfers Functional Problem(s): Bed Mobility;Bed to Chair;Furniture;Car PT Locomotion Functional Problem(s): Ambulation;Wheelchair Mobility;Stairs PT Plan PT Intensity: Minimum of 1-2 x/day ,45 to 90 minutes PT Frequency: 5 out of 7 days PT Duration Estimated Length of Stay: 21-24 days PT Treatment/Interventions: Ambulation/gait training;Balance/vestibular training;Cognitive remediation/compensation;Discharge planning;Community reintegration;DME/adaptive equipment instruction;Functional electrical stimulation;Functional mobility training;Patient/family education;Neuromuscular re-education;Psychosocial support;Splinting/orthotics;Therapeutic Exercise;Therapeutic Activities;Stair training;UE/LE Strength taining/ROM;UE/LE Coordination activities;Wheelchair propulsion/positioning PT Transfers Anticipated Outcome(s): supervision for basic and car PT Locomotion Anticipated Outcome(s): supervision x 150' gait , mod I 150' w/c; stairs TBD PT Recommendation Recommendations for Other Services: Neuropsych consult Follow Up Recommendations: Home health PT Patient destination: Home (daughter's home) Equipment Recommended: To be determined  Skilled Therapeutic Intervention  tx today: neuromuscular re-education for L attention, use of bil UEs and bil LEs for SBT to L with extra time, max assist to initiate, stand by assistance for w/c stability, and use of bil LEs to propel w/c x 25'.  Pt very  negative about his future ability to walk.  Emotional support provided.  Neuropsych eval appropriate.  PT Evaluation Precautions/Restrictions Precautions Precautions: Fall Precaution Comments: Pt has difficulty with reading (education not stroke related); will have difficulty with written materials Restrictions Weight Bearing Restrictions: Yes General   Vital SignsTherapy Vitals BP: 188/70 mmHg Pain Pain Assessment Pain Assessment: No/denies pain Home Living/Prior Functioning Home Living Available Help at Discharge: Family Type of Home: Apartment Home Access: Level entry Home Layout: One level;Full bath on main level (guest room on main)  Lives With: Daughter (discharge to daughter's house) Prior Function Level of Independence: Independent with basic ADLs;Independent with homemaking with ambulation;Independent with gait  Able to Take Stairs?: No Driving: No Vocation: On disability Comments: His daughter drives him to grocery store. 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 Vision/Perception  Pt did not read PTA. Vision - Assessment Eye Alignment: Within Functional Limits Ocular Range of Motion: Restricted looking up Alignment/Gaze Preference: Within Defined Limits Tracking/Visual Pursuits: Decreased smoothness of vertical tracking;Decreased smoothness of horizontal tracking Saccades: Additional eye shifts occurred during testing Convergence: Impaired (comment) (L eye does not converge)  Cognition Overall Cognitive Status: Impaired/Different from baseline Arousal/Alertness: Awake/alert Orientation Level: Oriented to person;Oriented to place;Oriented to situation;Disoriented to time Memory: Impaired Memory Impairment: Storage deficit;Retrieval deficit Awareness: Impaired Awareness Impairment: Intellectual impairment;Emergent impairment;Anticipatory impairment Problem Solving: Impaired Problem Solving Impairment: Verbal basic;Functional basic Behaviors:  (flat  affect) Safety/Judgment: Impaired Sensation Sensation Light Touch: Appears Intact bil feet Proprioception: NT due to communication problems Additional Comments: awareness of L side of body Coordination Gross Motor Movements are Fluid and Coordinated: No Fine Motor Movements are Fluid and Coordinated: No Finger Nose Finger Test: slight dysmetria on the L, but good speed Heel Shin Test: decreased speed, excursion and accuracy bil Motor  Motor Motor: Hemiplegia   Motor - Skilled Clinical Observations: Decreased LLE strength  Mobility Bed Mobility Bed Mobility: Not assessed Transfers Transfers: Yes Squat Pivot Transfers: 1: +2 Total assist Squat Pivot Transfer Details: Manual facilitation for placement;Manual facilitation for weight shifting;Tactile cues for placement;Verbal cues for technique Squat Pivot Transfer Details (indicate cue type and reason): poor motor planning and initiation Locomotion  Ambulation Ambulation: No Gait Gait: No Stairs / Additional Locomotion Stairs: No Wheelchair Mobility Wheelchair Mobility: Yes Wheelchair Assistance: 5: Careers information officer: Both upper extremities Wheelchair Parts Management: Needs assistance Distance: 150  Trunk/Postural Assessment  Cervical Assessment Cervical Assessment: Exceptions to [facility] Cervical AROM Overall Cervical AROM Comments: limited L and R rotation, extension due to hypertrophic scarring R neck  Thoracic Assessment Thoracic Assessment: Within Functional Limits Lumbar Assessment Lumbar Assessment: Exceptions to [facility] (sits in R hip ER) Lumbar Strength Overall Lumbar Strength Comments: waist crease R,  Postural Control Postural Control: Deficits on evaluation Righting Reactions: leans r in sitting  Balance Balance Balance Assessed: Yes Static Sitting Balance Static Sitting - Level of Assistance: 5: Stand by assistance Dynamic Sitting Balance Dynamic Sitting - Level of Assistance: 4: Min  assist Static Standing Balance Static Standing - Level of Assistance: Other (comment) (mod assist while standing in // with bil UE support) Dynamic Standing Balance Dynamic Standing - Level of Assistance: Not tested (comment) Extremity Assessment  RUE Assessment RUE Assessment: Within Functional Limits LUE Assessment LUE Assessment: Within Functional Limits RLE Assessment RLE Assessment: Exceptions to [facility] (tight hamstrings and heel cord) RLE Strength RLE Overall Strength Comments: grossly in sitting, 4/5 hip abd/add,  4-/5 hip flex, knee ext, ankle DF (limited excursion due to tightness), 2-/5 knee flex (difficult to assess due to ? motor planning) LLE Assessment LLE Assessment: Exceptions to [facility] (tight hamstrings and heel cord) LLE Strength LLE Overall Strength Comments: grossly in sitting, 4/5 hip flex,add,abd; 4-/5 knee ext, 2-/5 knee flex, 1+/5 ankle DF/PF (difficult to assess due to ? motor planning)  FIM:  FIM - Bed/Chair Transfer Bed/Chair Transfer: 4: Supine > Sit: Min A (steadying Pt. > 75%/lift 1 leg);1: Two helpers;1: Bed > Chair or W/C: Total A (helper does all/Pt. < 25%) FIM - Locomotion: Wheelchair Distance: 150   Refer to Care Plan for Long Term Goals  Recommendations for other services: Neuropsych; pt appears and acts depressed, and stated he does not think he will ever walk again.  Discharge Criteria: Patient will be discharged from PT if patient refuses treatment 3 consecutive times without medical reason, if treatment goals not met, if there is a change in medical status, if patient makes no progress towards goals or if patient is discharged from hospital.  The above assessment, treatment plan, treatment alternatives and goals were discussed and mutually agreed upon: by patient  Frederic Jericho 04/29/2014, 12:25 PM

## 2014-04-29 NOTE — Progress Notes (Signed)
PMR Admission Coordinator Pre-Admission Assessment  Patient: Nicholas [name] is an 63 y.o., male  MRN: [id number]  DOB: 07-20-1951  Height:  Weight: 106.686 kg (235 lb 3.2 oz)  Insurance Information  HMO: No PPO: PCP: IPA: 80/20: OTHER:  PRIMARY: Medicare A/B Policy#: [id number] Subscriber: Nicholas [name]  CM Name: Phone#: Fax#:  Pre-Cert#: Employer: Not employed  Benefits: Phone #: Name: Checked in [place]. Date: 06/09/02 Deduct: $1260 Out of Pocket Max: none Life Max: unlimited  CIR: 100% SNF: 100 days  Outpatient: 80% Co-Pay: 20%  Home Health: 100% Co-Pay: None  DME: 80% Co-Pay: 20%  Providers: patient's choice  Emergency Contact Information  Contact Information    Name  Relation  Home  Work  Mobile    [name],Nicholas  Daughter  [phone]   [phone]    [name],Nicholas  Relative    [phone]    Nicholas [name]  [phone]        Current Medical History  Patient Admitting Diagnosis: L ACA infarct  History of Present Illness: A 63 y.o.right handed male with history of hypertension, diabetes mellitus peripheral neuropathy, 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 Patient lives alone independent with a cane prior to admission. Admitted 04/26/2014 and altered mental status. MRI with acute nonhemorrhagic left ACA territory infarct as well as remote right parietal lobe infarct. MRA of the head with occlusion of the internal carotid artery with reconstitution at the level the posterior to indicating artery.echo with EF 55% without embolism.Carotid doppler without ICA stenosis.Cardiac enzymes negative.Neurology consulted placed on plavix/aspirin for CVA prophylaxis and subcutaneous Lovenox for DVT prophylaxis. Patient is maintained on a mechanical soft diet. Physical occupational therapy evaluations completed 04/27/2014 recommendations of physical  medicine rehabilitation consult. Admitted for comprehensive inpatient rehabilitation program.  Total: 6=NIH  Past Medical History  Past Medical History   Diagnosis  Date   .  Hypertension    .  Hyperlipidemia    .  Type 2 diabetes mellitus    .  History of myocardial infarction      1993-- NON-Q WAVE S/P PCI TO LAD   .  History of CVA (cerebrovascular accident)      1993--- NO RESIDUALS   .  Hydrocele, left    .  BPH (benign prostatic hypertrophy)      HX RETENTION   .  PSVT (paroxysmal supraventricular tachycardia)    .  Coronary artery disease  CARDIOLOGIST-- DR Johney Frame     S/P PCI TO LAD 1993   .  Carotid artery occlusion      S/P RIGHT CEA   .  PVD (peripheral vascular disease)      S/P LEFT FEM-POP   .  Left carotid artery stenosis      MILD   .  History of head injury      CONCUSSION-- NO RESIDUAL   .  Right leg weakness      USES CANE-- SECONDARY TO PVD   .  At high risk for falls    .  Wears glasses    .  Myocardial infarction    .  GERD (gastroesophageal reflux disease)     Family History  family history includes Hypertension in his father and mother; Stroke in his mother.  Prior Rehab/Hospitalizations: None  Current Medications  Current facility-administered medications:aspirin EC tablet 81 mg, 81 mg, Oral, Daily, Jasmine December L  Biby, NP; atorvastatin (LIPITOR) tablet 10 mg, 10 mg, Oral, q1800, Jerald KiefStephen K Chiu, MD, 10 mg at 04/27/14 2216; bisacodyl (DULCOLAX) suppository 10 mg, 10 mg, Rectal, Once, Jerald KiefStephen K Chiu, MD; clopidogrel (PLAVIX) tablet 75 mg, 75 mg, Oral, Q breakfast, Ripudeep K Rai, MD, 75 mg at 04/28/14 1024  diltiazem (CARDIZEM) injection 5 mg, 5 mg, Intravenous, Q4H PRN, Jerald KiefStephen K Chiu, MD; diltiazem [facility](TIAZAC) 24 hr capsule 360 mg, 360 mg, Oral, Daily, Eduard ClosArshad N Kakrakandy, MD, 360 mg at 04/28/14 1024; docusate sodium (COLACE) capsule 100 mg, 100 mg, Oral, BID, Eduard ClosArshad N Kakrakandy, MD, 100 mg at 04/28/14 1020; doxazosin (CARDURA) tablet 2 mg, 2 mg, Oral, Daily,  Eduard ClosArshad N Kakrakandy, MD, 2 mg at 04/28/14 1020  enoxaparin (LOVENOX) injection 40 mg, 40 mg, Subcutaneous, Q24H, Eduard ClosArshad N Kakrakandy, MD, 40 mg at 04/28/14 1021; glipiZIDE (GLUCOTROL XL) 24 hr tablet 5 mg, 5 mg, Oral, Daily, Eduard ClosArshad N Kakrakandy, MD, 5 mg at 04/28/14 1021; insulin aspart (novoLOG) injection 0-9 Units, 0-9 Units, Subcutaneous, TID WC, Eduard ClosArshad N Kakrakandy, MD, 2 Units at 04/28/14 1412; ketorolac (TORADOL) 15 MG/ML injection 15 mg, 15 mg, Intravenous, Q6H PRN, Jerald KiefStephen K Chiu, MD  metoprolol (LOPRESSOR) tablet 50 mg, 50 mg, Oral, BID, Eduard ClosArshad N Kakrakandy, MD, 50 mg at 04/28/14 1021; senna-docusate (Senokot-S) tablet 1 tablet, 1 tablet, Oral, QHS PRN, Eduard ClosArshad N Kakrakandy, MD  Patients Current Diet: Dysphagia  Precautions / Restrictions  Precautions  Precautions: Fall  Precaution Comments: blurry vision  Restrictions  Weight Bearing Restrictions: No  Prior Activity Level  Community (5-7x/wk): Went out daily. Not driving. Daughter does the driving.  Home Assistive Devices / Equipment  Home Assistive Devices/Equipment: Cane (specify quad or straight)  Home Equipment: None  Prior Functional Level  Prior Function  Level of Independence: Independent with assistive device(s)  Comments: Pt does not drive - reports dtr drives him  Current Functional Level  Cognition  Arousal/Alertness: Awake/alert  Overall Cognitive Status: Impaired/Different from baseline  Current Attention Level: Sustained  Orientation Level: Oriented X4  Following Commands: Follows one step commands with increased time  Safety/Judgement: Decreased awareness of safety;Decreased awareness of deficits  General Comments: Pt with delayed processing. Pt with very little output with OT - question if due to embarrassment with family in room. RN reports pt's demeanor changed after PT  Attention: Focused;Sustained  Focused Attention: Appears intact  Sustained Attention: Impaired  Sustained Attention Impairment: Verbal  complex;Functional complex  Memory: Impaired  Memory Impairment: Storage deficit;Retrieval deficit  Awareness: Impaired  Awareness Impairment: Intellectual impairment;Emergent impairment;Anticipatory impairment  Problem Solving: Impaired  Problem Solving Impairment: Verbal basic;Functional basic  Executive Function: Reasoning  Behaviors: (flat affect)  Safety/Judgment: Impaired   Extremity AssessmentUpper Extremity Assessment: Generalized weakness  Upper Extremity Assessment: Generalized weakness  Lower Extremity Assessment: RLE deficits/detail;LLE deficits/detail  RLE Deficits / Details: pt with grossly 2+/5 pt able to move legs in bed and in sitting but unable to complete full ROM in either position and assume more related to cognition then strength. Pt able to localize touch and reports equal  LLE Deficits / Details: pt with grossly 2+/5 pt able to move legs in bed and in sitting but unable to complete full ROM in either position and assume more related to cognition then strength. Pt able to localize touch and reports equal  Cervical / Trunk Assessment: Normal   ADLs  Overall ADL's : Needs assistance/impaired  Eating/Feeding: Moderate assistance;Bed level  Grooming: Wash/dry hands;Wash/dry face;Moderate assistance;Bed level  Upper Body Bathing: Moderate assistance;Bed  level  Lower Body Bathing: Total assistance;Bed level  Upper Body Dressing : Total assistance;Bed level  Lower Body Dressing: Total assistance;Bed level  Toilet Transfer: Total assistance (unable)  Toileting- Clothing Manipulation and Hygiene: Total assistance;Bed level  Functional mobility during ADLs: Maximal assistance (to move and to sit EOB)  General ADL Comments: Pt required max A to sit EOB. Pt with flat affect and only minimally particiaptory. Pt's daughter and granddaughter present - unsure if pt self conscious and not engaging unless max prompts provided. RN reports pt's affect changed significantly after PT -  questionable depression after realizing extent of deficits.   Mobility  Overal bed mobility: Needs Assistance  Bed Mobility: Supine to Sit;Sit to Supine  Rolling: Min assist  Supine to sit: Total assist  Sit to supine: Max assist  General bed mobility comments: Patient sitting up in recliner before and after session   Transfers  Overall transfer level: Needs assistance  Equipment used: Rolling walker (2 wheeled)  Transfers: Sit to/from Stand  Sit to Stand: +2 physical assistance;Mod assist  General transfer comment: A to power up into standing, to ensure balance/weight shift and to block R LE with standing. Cues for proper technique and use of RW. Patient able to pick up right and left foot while standing with heavy lean to the right side. Patient with uncontrolled descents to recliner.   Ambulation / Gait / Stairs / Scientist, research (medical)Wheelchair Mobility    Posture / Balance  Dynamic Sitting Balance  Sitting balance - Comments: Pt falling to Rt. with little to no initiation to correct. Requires max A to maintain EOB sitting   Special needs/care consideration  BiPAP/CPAP No  CPM No  Continuous Drip IV No  Dialysis No  Life Vest No  Oxygen No  Special Bed No  Trach Size No  Wound Vac (area) No  Skin Dry  Bowel mgmt: Had last BM 04/26/14 and problems with constipation  Bladder mgmt: Foley catheter in place for urinary retention  Diabetic mgmt Yes, on medications orally at home.   Previous Home Environment  Living Arrangements: Alone  Lives With: Alone  Available Help at Discharge: Family;Available 24 hours/day  Type of Home: Apartment  Home Layout: One level  Home Access: Level entry  Home Care Services: No  Discharge Living Setting  Plans for Discharge Living Setting: House;Lives with (comment) (Plans to go home with daughter and son in law.)  Type of Home at Discharge: House  Discharge Home Layout: Two level;Able to live on main level with bedroom/bathroom (Can stay in guest room with bathroom on  main level.)  Alternate Level Stairs-Number of Steps: Flight  Discharge Home Access: Level entry (Has small lip at doorway entrance.)  Does the patient have any problems obtaining your medications?: No  Social/Family/Support Systems  Patient Roles: Parent  Contact Information: Myra RudeDaphne [name] - daughter  Anticipated Caregiver: daughter and son-in-law  Anticipated Caregiver's Contact Information: [name] [phone]  Ability/Limitations of Caregiver: Dtr works days as a Runner, broadcasting/film/videoteacher and son-in-law works evenings  Caregiver Availability: 24/7  Discharge Plan Discussed with Primary Caregiver: Yes  Is Caregiver In Agreement with Plan?: Yes  Does Caregiver/Family have Issues with Lodging/Transportation while Pt is in Rehab?: No  Goals/Additional Needs  Patient/Family Goal for Rehab: PT/OT min Assist, ST supervision goals  Expected length of stay: 14-21 days  Cultural Considerations: None  Dietary Needs: Dys 3, thin liquids  Equipment Needs: TBD  Pt/Family Agrees to Admission and willing to participate: Yes  Program Orientation Provided & Reviewed  with Pt/Caregiver Including Roles & Responsibilities: Yes  Decrease burden of Care through IP rehab admission: N/A  Possible need for SNF placement upon discharge: Not planned  Patient Condition: This patient's condition remains as documented in the consult dated 04/28/14, in which the Rehabilitation Physician determined and documented that the patient's condition is appropriate for intensive rehabilitative care in an inpatient rehabilitation facility. Will admit to inpatient rehab today.  Preadmission Screen Completed By: Trish Mage, 04/28/2014 3:06 PM  ______________________________________________________________________  Discussed status with Dr. Riley Kill on 04/28/14 at 1453 and received telephone approval for admission today.  Admission Coordinator: Trish Mage, time1453/Date05/20/15  Cosigned by: Ranelle Oyster, MD [04/28/2014 3:18 PM]    Revision History.Marland KitchenMarland Kitchen

## 2014-04-29 NOTE — Evaluation (Signed)
Occupational Therapy Assessment and Plan  Patient Details  Name: Nicholas [name] MRN: [id number] Date of Birth: Mar 01, 1951  OT Diagnosis: apraxia, cognitive deficits, disturbance of vision and hemiplegia affecting non-dominant side Rehab Potential: Rehab Potential: Good ELOS: 21-24 days   Today's Date: 04/29/2014 Time: 0802-0902 and 1400-1430 Time Calculation (min): 60 min and 30 min  Problem List:  Patient Active Problem List   Diagnosis Date Noted  . CVA (cerebral infarction) 04/28/2014  . Stroke 04/26/2014  . Acute encephalopathy 04/26/2014  . Abdominal pain 04/26/2014  . PSVT 09/15/2010  . AORTIC INSUFFICIENCY 08/09/2010  . CLAUDICATION 08/09/2010  . PALPITATIONS 08/09/2010  . PALPITATIONS, HX OF 08/09/2010  . HYPERLIPIDEMIA-MIXED 08/08/2010  . TOBACCO ABUSE 08/08/2010  . HYPERTENSION, UNSPECIFIED 08/08/2010  . CAD, NATIVE VESSEL 08/08/2010  . CAROTID ARTERY DISEASE 08/08/2010    Past Medical History:  Past Medical History  Diagnosis Date  . Hypertension   . Hyperlipidemia   . Type 2 diabetes mellitus   . History of myocardial infarction     1993--  NON-Q WAVE  S/P PCI TO LAD  . History of CVA (cerebrovascular accident)     1993---  NO RESIDUALS  . Hydrocele, left   . BPH (benign prostatic hypertrophy)     HX RETENTION  . PSVT (paroxysmal supraventricular tachycardia)   . Coronary artery disease CARDIOLOGIST--  DR Rayann Heman    S/P  PCI TO LAD 1993  . Carotid artery occlusion     S/P RIGHT CEA  . PVD (peripheral vascular disease)     S/P LEFT FEM-POP  . Left carotid artery stenosis     MILD  . History of head injury     CONCUSSION--  NO RESIDUAL  . Right leg weakness     USES CANE--  SECONDARY TO PVD  . At high risk for falls   . Wears glasses   . Myocardial infarction   . GERD (gastroesophageal reflux disease)    Past Surgical History:  Past Surgical History  Procedure Laterality Date  . Carotid endarterectomy Right 05-31-2003  . Femoral-popliteal  bypass graft Left 01-19-2003  . Right hydrocelectomy  05-31-2003  . Coronary angioplasty  1993    PCI TO LAD  . Cardiac catheterization  01-18-2003   DR NISHAN    NON-OBSTRUCTIVE CAD/  PREVIOIS ANGIOPLASTY SITE WIDELY PATENT  . Cardiovascular stress test  2007    SMALL ANTERO-APICAL SCAR/  NO ISCHEMIA  . Transthoracic echocardiogram  08-24-2010    EF 55%/  MILD AORTIC INSUFFICENCY/  MODERATE LVH  . Hydrocele excision Left 10/27/2013    Procedure: HYDROCELECTOMY ADULT;  Surgeon: Hanley Ben, MD;  Location: [facility];  Service: Urology;  Laterality: Left;    Assessment & Plan Clinical Impression:  Nicholas [name] is a 63 y.o.right handed male with history of hypertension, diabetes mellitus peripheral neuropathy, 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 Patient lives alone independent with a cane prior to admission. Admitted 04/26/2014 and altered mental status.   MRI with acute nonhemorrhagic left ACA territory infarct as well as remote right parietal lobe infarct. MRA of the head with occlusion of the internal carotid artery with reconstitution at the level the posterior to indicating artery.echo with EF 55% without embolism.Carotid doppler without ICA stenosis.Cardiac enzymes negative.Neurology consulted placed on plavix/aspirin for CVA prophylaxis and subcutaneous Lovenox for DVT prophylaxis. Patient is maintained on  a mechanical soft diet. Physical occupational therapy evaluations completed 04/27/2014 recommendations of physical medicine rehabilitation consult. Admitted for comprehensive rehabilitation program.   Patient transferred to CIR on 04/28/2014 .    Patient currently requires max with basic self-care skills secondary to muscle weakness, decreased cardiorespiratoy endurance, motor apraxia, decreased visual perceptual skills and decreased visual motor  skills, decreased attention to left, decreased attention, decreased awareness, decreased problem solving, decreased safety awareness, decreased memory and delayed processing and decreased standing balance and decreased postural control.  Prior to hospitalization, patient was living alone independently in an apartment. Pt states he did not use a cane. His daughter would drive him to the grocery store.  Patient will benefit from skilled intervention to increase independence with basic self-care skills prior to discharge home with care partner.  Anticipate patient will require 24 hour supervision and follow up home health. Pt desires to go home alone, but will likely need 24 hr supervision due to cognitive deficits.  OT - End of Session Activity Tolerance: Tolerates 10 - 20 min activity with multiple rests OT Assessment Rehab Potential: Good Barriers to Discharge: Decreased caregiver support Barriers to Discharge Comments: Daughter is a Education officer, museum, brother works full time. Plans to go home alone. OT Patient demonstrates impairments in the following area(s): Balance;Cognition;Motor;Perception;Safety;Vision;Endurance;Behavior OT Basic ADL's Functional Problem(s): Bathing;Dressing;Toileting;Grooming OT Advanced ADL's Functional Problem(s): Simple Meal Preparation;Light Housekeeping OT Transfers Functional Problem(s): Toilet;Tub/Shower OT Additional Impairment(s): None OT Plan OT Intensity: Minimum of 1-2 x/day, 45 to 90 minutes OT Frequency: 5 out of 7 days OT Duration/Estimated Length of Stay: 21-24 days OT Treatment/Interventions: Balance/vestibular training;Cognitive remediation/compensation;Discharge planning;Community reintegration;DME/adaptive equipment instruction;Functional mobility training;Neuromuscular re-education;Psychosocial support;Patient/family education;Self Care/advanced ADL retraining;Therapeutic Activities;Visual/perceptual remediation/compensation;UE/LE Strength  taining/ROM;UE/LE Coordination activities;Therapeutic Exercise OT Self Feeding Anticipated Outcome(s): I OT Basic Self-Care Anticipated Outcome(s): supervision OT Toileting Anticipated Outcome(s): supervision OT Bathroom Transfers Anticipated Outcome(s): supervision OT Recommendation Recommendations for Other Services: Neuropsych consult Patient destination: Home Follow Up Recommendations: Home health OT Equipment Recommended: 3 in 1 bedside comode;Tub/shower seat   Skilled Therapeutic Intervention Visit 1: Pain: no c/o pain Pt seen for initial evaluation and ADL retraining with a focus on attention and awareness. Purpose and goals of OT explained to patient. Pt stated that his only problem from his new stroke was his memory. He said his strength was fine.  Only UB strength formally assessed [facility]), but pt needed mod A with sit to stand and to stand with RW.  Pt was unable to safely march feet in place or step forward and back, so stand pivot to w/c attempted 5x. Pt would not move to his left as his body tightened up. Pt was not able to follow directions with hand placement. NT called in to assist, pt required total A x 2 to transfer to his left. No clothing available, but pt did eat and bathe with minimal cues. Mod A with bathing.  Pt resting in w/c with QRB and call light in reach at end of session.  Visit 2: pain: no c/o Pt seen this session to address toilet transfers and toileting.  Pt used w/c into bathroom and needed max cues for hand placement to stand from chair and use grab bars. He then stood with only min A but mod A to turn his left leg to transfer. Max cues to cleanse self.  Pt transferred  Back to w/c with mod A and 2 people to guide him to the chair.  Pt washed hands at sink with min cues.  Pt's  QRB reapplied and pt resting in chair with call light in reach.  OT Evaluation Precautions/Restrictions  Precautions Precautions: Fall Precaution Comments: Pt has difficulty with reading  (education not stroke related); will have difficulty with written materials Restrictions Weight Bearing Restrictions: Yes General Chart Reviewed: Yes Family/Caregiver Present: No Vital Signs Therapy Vitals BP: 188/70 mmHg Pain Pain Assessment Pain Assessment: No/denies pain Home Living/Prior Functioning Home Living Available Help at Discharge: Family Type of Home: Apartment Home Access: Level entry Home Layout: One level  Lives With: Alone Prior Function Level of Independence: Independent with basic ADLs;Independent with homemaking with ambulation;Independent with gait  Able to Take Stairs?: No Driving: No Vocation: On disability Comments: His daughter drives him to grocery store. 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 ADL ADL ADL Comments: Refer to FIM Vision/Perception  Vision- History Baseline Vision/History: Wears glasses Wears Glasses: At all times Patient Visual Report: No change from baseline Vision- Assessment Vision Assessment?: Yes Eye Alignment: Within Functional Limits Ocular Range of Motion: Restricted looking up Alignment/Gaze Preference: Within Defined Limits Tracking/Visual Pursuits: Decreased smoothness of vertical tracking;Decreased smoothness of horizontal tracking Saccades: Additional eye shifts occurred during testing Convergence: Impaired (comment) (L eye does not converge) Visual Fields: No apparent deficits  Cognition Overall Cognitive Status: Impaired/Different from baseline Arousal/Alertness: Awake/alert Orientation Level: Oriented to person;Oriented to place;Oriented to situation Memory: Impaired Memory Impairment: Storage deficit;Retrieval deficit Awareness: Impaired Awareness Impairment: Intellectual impairment;Emergent impairment;Anticipatory impairment Problem Solving: Impaired Problem Solving Impairment: Verbal basic;Functional basic Behaviors:  (flat affect) Safety/Judgment: Impaired Sensation Sensation Light Touch: Appears  Intact Stereognosis: Appears Intact Hot/Cold: Appears Intact Proprioception: Impaired by gross assessment Additional Comments: awareness of L side of body Coordination Gross Motor Movements are Fluid and Coordinated: No Fine Motor Movements are Fluid and Coordinated: Yes Finger Nose Finger Test: slight dysmetria on the L, but good speed Motor  Motor Motor: Hemiplegia Motor - Skilled Clinical Observations: Decreased LLE strength Mobility    refer to FIM Trunk/Postural Assessment  Cervical Assessment Cervical Assessment: Within Functional Limits Thoracic Assessment Thoracic Assessment: Within Functional Limits Lumbar Assessment Lumbar Assessment: Within Functional Limits Postural Control Postural Control: Within Functional Limits  Balance Static Sitting Balance Static Sitting - Level of Assistance: 5: Stand by assistance Dynamic Sitting Balance Dynamic Sitting - Level of Assistance: 4: Min assist Static Standing Balance Static Standing - Level of Assistance: 3: Mod assist Dynamic Standing Balance Dynamic Standing - Level of Assistance: Not tested (comment) Extremity/Trunk Assessment RUE Assessment RUE Assessment: Within Functional Limits LUE Assessment LUE Assessment: Within Functional Limits  FIM:  FIM - Eating Eating Activity: 5: Set-up assist for open containers FIM - Grooming Grooming Steps: Wash, rinse, dry face Grooming: 2: Patient completes 1 of 4 or 2 of 5 steps FIM - Bathing Bathing Steps Patient Completed: Chest;Right Arm;Left Arm;Abdomen;Right upper leg;Left upper leg Bathing: 3: Mod-Patient completes 5-7 [id number] parts or 50-74% FIM - Upper Body Dressing/Undressing Upper body dressing/undressing: 0: Wears gown/pajamas-no public clothing FIM - Lower Body Dressing/Undressing Lower body dressing/undressing: 0: Wears gown/pajamas-no public clothing (could not don his socks) FIM - Toileting Toileting: 0: Activity did not occur FIM - Bed/Chair  Transfer Bed/Chair Transfer: 4: Supine > Sit: Min A (steadying Pt. > 75%/lift 1 leg);1: Two helpers;1: Bed > Chair or W/C: Total A (helper does all/Pt. < 25%) FIM - Toilet Transfers Toilet Transfers: 0-Activity did not occur FIM - Tub/Shower Transfers Tub/shower Transfers: 0-Activity did not occur or was simulated   Refer to Care Plan for Long Term Goals  Recommendations for  other services: Neuropsych  Discharge Criteria: Patient will be discharged from OT if patient refuses treatment 3 consecutive times without medical reason, if treatment goals not met, if there is a change in medical status, if patient makes no progress towards goals or if patient is discharged from hospital.  The above assessment, treatment plan, treatment alternatives and goals were discussed and mutually agreed upon: by patient  Harlene Ramus 04/29/2014, 10:25 AM

## 2014-04-30 ENCOUNTER — Inpatient Hospital Stay (HOSPITAL_COMMUNITY): Payer: Medicare Other | Admitting: Occupational Therapy

## 2014-04-30 ENCOUNTER — Inpatient Hospital Stay (HOSPITAL_COMMUNITY): Payer: Medicare Other

## 2014-04-30 ENCOUNTER — Inpatient Hospital Stay (HOSPITAL_COMMUNITY): Payer: Medicare Other | Admitting: *Deleted

## 2014-04-30 LAB — GLUCOSE, CAPILLARY
GLUCOSE-CAPILLARY: 151 mg/dL — AB (ref 70–99)
GLUCOSE-CAPILLARY: 122 mg/dL — AB (ref 70–99)
Glucose-Capillary: 114 mg/dL — ABNORMAL HIGH (ref 70–99)
Glucose-Capillary: 85 mg/dL (ref 70–99)

## 2014-04-30 MED ORDER — SENNOSIDES-DOCUSATE SODIUM 8.6-50 MG PO TABS
2.0000 | ORAL_TABLET | Freq: Every evening | ORAL | Status: DC | PRN
  Administered 2014-05-03: 2 via ORAL
  Filled 2014-04-30: qty 2

## 2014-04-30 NOTE — Progress Notes (Signed)
Speech Language Pathology Daily Session Note  Patient Details  Name: Nicholas [name] MRN: [id number] Date of Birth: March 24, 1951  Today's Date: 04/30/2014 Time: 5277-8242 Time Calculation (min): 42 min  Short Term Goals: Week 1: SLP Short Term Goal 1 (Week 1): Patient will demonstrate basic problem solving during self-care tasks with Min verbal cues SLP Short Term Goal 2 (Week 1): Patient will sustain attention to basic self-care tasks for 5 minutes with Mod cues SLP Short Term Goal 3 (Week 1): Patient will label 3 deficits with Mod question cues SLP Short Term Goal 4 (Week 1): Patient will tolerate regula textures and thin liquids with Mod I  SLP Short Term Goal 5 (Week 1): Patient will follow multi-step commands during functional tasks with Min vebral cues  Skilled Therapeutic Interventions: Skilled treatment focused on cognitive and swallowing goals. SLP facilitated session with observation of breakfast meal with set-up assistance. Once tray was set-up, pt consumed the meal consisting of regular textures and thin liquids with Mod I with no overt s/s of aspiration. Although pt has a tendency to take larger bites, he appears to have adequate control and awareness of boluses, demonstrating thorough mastication and oral clearance prior to taking the next bite. Pt performed basic money counting task with extra time and Mod-Max cues for working memory and problem solving. Continue plan of care.   FIM:  Comprehension Comprehension Mode: Auditory Comprehension: 5-Understands basic 90% of the time/requires cueing < 10% of the time Expression Expression Mode: Verbal Expression: 5-Expresses basic needs/ideas: With extra time/assistive device Social Interaction Social Interaction: 4-Interacts appropriately 75 - 89% of the time - Needs redirection for appropriate language or to initiate interaction. Problem Solving Problem Solving: 2-Solves basic 25 - 49% of the time - needs direction more than  half the time to initiate, plan or complete simple activities Memory Memory: 3-Recognizes or recalls 50 - 74% of the time/requires cueing 25 - 49% of the time FIM - Eating Eating Activity: 5: Set-up assist for open containers;6: More than reasonable amount of time;6: Swallowing techniques: self-managed  Pain Pain Assessment Pain Assessment: No/denies pain  Therapy/Group: Individual Therapy   Maxcine Ham, M.A. CCC-SLP [phone]  Maxcine Ham 04/30/2014, 10:12 AM

## 2014-04-30 NOTE — IPOC Note (Signed)
Overall Plan of Care [facility]) Patient Details Name: Nicholas [name] MRN: [id number] DOB: 10/21/1951  Admitting Diagnosis: L [facility] INFARCT  Hospital Problems: Active Problems:   CVA (cerebral infarction)     Functional Problem List: Nursing Bladder;Bowel;Edema;Motor;Safety  PT Balance;Endurance;Motor;Safety;Behavior  OT Balance;Cognition;Motor;Perception;Safety;Vision;Endurance;Behavior  SLP Cognition;Nutrition;Linguistic  TR         Basic ADL's: OT Bathing;Dressing;Toileting;Grooming     Advanced  ADL's: OT Simple Meal Preparation;Light Housekeeping     Transfers: PT Bed Mobility;Bed to Chair;Furniture;Car  OT Toilet;Tub/Shower     Locomotion: PT Ambulation;Wheelchair Mobility;Stairs     Additional Impairments: OT None  SLP Swallowing;Communication;Social Cognition expression;comprehension Problem Solving;Memory;Attention;Awareness  TR      Anticipated Outcomes Item Anticipated Outcome  Self Feeding I  Swallowing  Mod I    Basic self-care  supervision  Toileting  supervision   Bathroom Transfers supervision  Bowel/Bladder  Continent of bowel and bladder  Transfers  supervision for basic and car  Locomotion  supervision x 150' gait , mod I 150' w/c; stairs TBD  Communication  Mod I   Cognition  Supervision   Pain  </=3  Safety/Judgment  No falls with injury   Therapy Plan: PT Intensity: Minimum of 1-2 x/day ,45 to 90 minutes PT Frequency: 5 out of 7 days PT Duration Estimated Length of Stay: 21-24 days OT Intensity: Minimum of 1-2 x/day, 45 to 90 minutes OT Frequency: 5 out of 7 days OT Duration/Estimated Length of Stay: 21-24 days SLP Intensity: Minumum of 1-2 x/day, 30 to 90 minutes SLP Frequency: 5 out of 7 days SLP Duration/Estimated Length of Stay: 21-24 days       Team Interventions: Nursing Interventions Patient/Family Education;Bladder Management;Bowel Management;Pain Management;Medication Management;Psychosocial Support  PT  interventions Ambulation/gait training;Balance/vestibular training;Cognitive remediation/compensation;Discharge planning;Community reintegration;DME/adaptive equipment instruction;Functional electrical stimulation;Functional mobility training;Patient/family education;Neuromuscular re-education;Psychosocial support;Splinting/orthotics;Therapeutic Exercise;Therapeutic Activities;Stair training;UE/LE Strength taining/ROM;UE/LE Coordination activities;Wheelchair propulsion/positioning  OT Interventions Balance/vestibular training;Cognitive remediation/compensation;Discharge planning;Community reintegration;DME/adaptive equipment instruction;Functional mobility training;Neuromuscular re-education;Psychosocial support;Patient/family education;Self Care/advanced ADL retraining;Therapeutic Activities;Visual/perceptual remediation/compensation;UE/LE Strength taining/ROM;UE/LE Coordination activities;Therapeutic Exercise  SLP Interventions Cognitive remediation/compensation;Cueing hierarchy;Environmental controls;Functional tasks;Internal/external aids;Patient/family education;Speech/Language facilitation;Therapeutic Activities  TR Interventions    SW/CM Interventions Discharge Planning;Psychosocial Support;Patient/Family Education    Team Discharge Planning: Destination: PT-Home (daughter's home) ,OT- Home , SLP-Home Projected Follow-up: PT-Home health PT, OT-  Home health OT, SLP-24 hour supervision/assistance Projected Equipment Needs: PT-To be determined, OT- 3 in 1 bedside comode;Tub/shower seat, SLP-None recommended by SLP Equipment Details: PT- , OT-  Patient/family involved in discharge planning: PT- Patient,  OT-Patient, SLP-Patient  MD ELOS: 15-20 days Medical Rehab Prognosis:  Good Assessment: 63 y.o.right handed male with history of hypertension, diabetes mellitus peripheral neuropathy, CVA 1993 with left leg weakness as well as chronic left foot drop from lumbar injury work related, right carotid  surgery 20 years ago and CAD with stenting. Patient lives alone independent with a cane prior to admission. Admitted 04/26/2014 and altered mental status.    Now requiring 24/7 Rehab RN,MD, as well as CIR level PT, OT and SLP.  Treatment team will focus on ADLs and mobility with goals set at Sup   See Team Conference Notes for weekly updates to the plan of care

## 2014-04-30 NOTE — Progress Notes (Signed)
Occupational Therapy Session Note  Patient Details  Name: Nicholas [name] MRN: [id number] Date of Birth: 11/12/1951  Today's Date: 04/30/2014 Time: 0800-0900 and 1300-1345 Time Calculation (min): 60 min and 45 min  Short Term Goals: Week 1:  OT Short Term Goal 1 (Week 1): Pt will transfer to toilet from w/c with mod A x1 using grab bars. OT Short Term Goal 2 (Week 1): Pt will don a shirt with verbal cues only. OT Short Term Goal 3 (Week 1): Pt will don pants over feet with min A and pull over hips with mod A. OT Short Term Goal 4 (Week 1): Pt will stand from EOB and stand with RW with min A. OT Short Term Goal 5 (Week 1): Pt will bathe with min A.  Skilled Therapeutic Interventions/Progress Updates:    Visit 1: Pain: c/o stomach pain, reported it to nurse Tx:   Pt seen for BADL retraining of toileting, bathing, and dressing with a focus on motor planning and attention.  Pt was asked to sit up to EOB (S) and he began to eat breakfast with s/u.  Pt c/o stomach pain, so encouraged pt to sit on toilet. Initially bed to w/c transfer extremely difficult due to pt's apraxia.  Transfer A not available immediately, so instructed pt just to scoot to chair with firm tone telling pt that he has to move on his own. He actually did scoot to w/c with only supervision! Pt then used grab bars in bathroom to stand pivot to toilet with min A.  Pt was not able to void on toilet, but when he stood to have briefs pulled up he voided.  Pt bathed on toilet and stood with min A to bathe bottom and front perineal area.  Pt dressed self from w/c with only steady A in standing and min A to pull pants up. Brushed teeth at sink. Much improved cognition, awareness, motor planning today. Pt resting in w/c with call light in reach.   Visit 2:  Pain: No c/o pain. Pt seen this session to address trunk control and functional mobility.  Pt was in wc and taken to gym.  Pt worked on scoot transfers w/c >< mat and scooting L/R on  mat with close S and extra time needed.  Pt needed cues to lean forward to fully elevate hips. Pt stated he has had chronic back pain and doesn't like to lean forward. Pt worked on lateral leans to encourage pelvic wt shift to L/R and general UE AROM to increase general coordination. B ball hold with forward reaching, sit to stand from mat using wc handles for support in standing with hip sways. Pt does not like to put weight on LLE.  From sitting EOM, pt given simple pipe tree design to follow. Needed max cues to find pieces and place them together appropriately. Pt taken back to room where his wife was waiting.  She was asked about his demeanor before his first CVA years ago, she said he has always had a flat affect and is very quiet. He currently does laugh at jokes at the appropriate times. QRB applied to pt in chair. Pt with call light in reach in room.   Therapy Documentation Precautions:  Precautions Precautions: Fall Precaution Comments: Pt has difficulty with reading (education not stroke related); will have difficulty with written materials Restrictions Weight Bearing Restrictions: Yes    Vital Signs: Therapy Vitals Temp: 98.1 F (36.7 C) Temp src: Oral Pulse Rate: 85 Resp: 18  BP: 153/70 mmHg Patient Position (if appropriate): Lying Oxygen Therapy SpO2: 94 % ADL: ADL ADL Comments: Refer to FIM  See FIM for current functional status  Therapy/Group: Individual Therapy  Stark Bray Saguier 04/30/2014, 9:00 AM

## 2014-04-30 NOTE — Progress Notes (Signed)
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 Patient lives alone independent with a cane prior to admission. Admitted 04/26/2014 and altered mental status.  MRI with acute nonhemorrhagic left ACA territory infarct as well as remote right parietal lobe infarct. 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 Physical occupational therapy evaluations completed 04/27/2014 recommendations of physical medicine rehabilitation consult.  Subjective/Complaints: Feel constipated, "went a couple days ago" Denies abd pain, N/V  Review of Systems - unable to obtain secondary to mental status  Objective: Vital Signs: Blood pressure 153/70, pulse 85, temperature 98.1 F (36.7 C), temperature source Oral, resp. rate 18, height 6' 0.05" (1.83 m), weight 106.7 kg (235 lb 3.7 oz), SpO2 94.00%. No results found. Results for orders placed during the hospital encounter of 04/28/14 (from the past 72 hour(s))  CBC     Status: None   Collection Time    04/28/14  7:26 PM      Result Value Ref Range   WBC 8.9  4.0 - 10.5 K/uL   RBC 4.52  4.22 - 5.81 MIL/uL   Hemoglobin 15.3  13.0 - 17.0 g/dL   HCT 44.9  39.0 - 52.0 %   MCV 99.3  78.0 - 100.0 fL   MCH 33.8  26.0 - 34.0 pg   MCHC 34.1  30.0 - 36.0 g/dL   RDW 12.7  11.5 - 15.5 %   Platelets 347  150 - 400 K/uL  CREATININE, SERUM     Status: Abnormal   Collection Time    04/28/14  7:26 PM      Result Value Ref Range   Creatinine, Ser 1.01  0.50 - 1.35 mg/dL   GFR calc non Af Amer 78 (*) >90 mL/min   GFR calc Af Amer >90  >90 mL/min   Comment: (NOTE)     The eGFR has been calculated  using the CKD EPI equation.     This calculation has not been validated in all clinical situations.     eGFR's persistently <90 mL/min signify possible Chronic Kidney     Disease.  GLUCOSE, CAPILLARY     Status: Abnormal   Collection Time    04/28/14  9:20 PM      Result Value Ref Range   Glucose-Capillary 157 (*) 70 - 99 mg/dL  CBC WITH DIFFERENTIAL     Status: None   Collection Time    04/29/14  5:03 AM      Result Value Ref Range   WBC 9.8  4.0 - 10.5 K/uL   RBC 4.40  4.22 - 5.81 MIL/uL   Hemoglobin 14.7  13.0 - 17.0 g/dL   HCT 43.5  39.0 - 52.0 %   MCV 98.9  78.0 - 100.0 fL   MCH 33.4  26.0 - 34.0 pg   MCHC 33.8  30.0 - 36.0 g/dL   RDW 12.7  11.5 - 15.5 %   Platelets 342  150 - 400 K/uL   Neutrophils Relative % 70  43 - 77 %   Neutro Abs 6.9  1.7 - 7.7 K/uL   Lymphocytes Relative 16  12 - 46 %   Lymphs Abs 1.5  0.7 - 4.0 K/uL   Monocytes Relative  10  3 - 12 %   Monocytes Absolute 1.0  0.1 - 1.0 K/uL   Eosinophils Relative 4  0 - 5 %   Eosinophils Absolute 0.4  0.0 - 0.7 K/uL   Basophils Relative 0  0 - 1 %   Basophils Absolute 0.0  0.0 - 0.1 K/uL  COMPREHENSIVE METABOLIC PANEL     Status: Abnormal   Collection Time    04/29/14  5:03 AM      Result Value Ref Range   Sodium 142  137 - 147 mEq/L   Potassium 3.7  3.7 - 5.3 mEq/L   Chloride 104  96 - 112 mEq/L   CO2 25  19 - 32 mEq/L   Glucose, Bld 95  70 - 99 mg/dL   BUN 10  6 - 23 mg/dL   Creatinine, Ser 0.97  0.50 - 1.35 mg/dL   Calcium 9.3  8.4 - 10.5 mg/dL   Total Protein 6.9  6.0 - 8.3 g/dL   Albumin 3.5  3.5 - 5.2 g/dL   AST 25  0 - 37 U/L   ALT 23  0 - 53 U/L   Alkaline Phosphatase 88  39 - 117 U/L   Total Bilirubin 0.9  0.3 - 1.2 mg/dL   GFR calc non Af Amer 87 (*) >90 mL/min   GFR calc Af Amer >90  >90 mL/min   Comment: (NOTE)     The eGFR has been calculated using the CKD EPI equation.     This calculation has not been validated in all clinical situations.     eGFR's persistently <90 mL/min signify  possible Chronic Kidney     Disease.  GLUCOSE, CAPILLARY     Status: Abnormal   Collection Time    04/29/14  7:25 AM      Result Value Ref Range   Glucose-Capillary 106 (*) 70 - 99 mg/dL   Comment 1 Notify RN    GLUCOSE, CAPILLARY     Status: Abnormal   Collection Time    04/29/14 12:12 PM      Result Value Ref Range   Glucose-Capillary 113 (*) 70 - 99 mg/dL   Comment 1 Notify RN    GLUCOSE, CAPILLARY     Status: None   Collection Time    04/29/14  4:47 PM      Result Value Ref Range   Glucose-Capillary 93  70 - 99 mg/dL   Comment 1 Notify RN    GLUCOSE, CAPILLARY     Status: Abnormal   Collection Time    04/29/14  8:53 PM      Result Value Ref Range   Glucose-Capillary 127 (*) 70 - 99 mg/dL      Head: Normocephalic.  Eyes:  Pupils reactive to light  Neck: Normal range of motion. Neck supple. No thyromegaly present.  Cardiovascular: Normal rate and regular rhythm.  Respiratory: Effort normal and breath sounds normal. No respiratory distress.  GI: Soft. Bowel sounds are normal. He exhibits no distension.  Neurological:  Flat affect.Oriented to person,place,age and DOB.Follows simple commands .Fair awareness of deficits  Skin: Skin is warm and dry.  Scar right side of neck  motor strength is 5/5 bilateral deltoid, bicep, tricep, grip  Minimal dysmetria bilaterally finger-nose-finger  Right lower extremity 4/5 hip flexor knee extensor ankle dorsi flexion plantar flexor  Left extremity 4/5 in the left hip flexor knee extensor 3 minus ankle dorsiflexor plantar flexor  Sensation intact to light touch R. upper and  lower limbs  Patient is not oriented to Month day or date   Assessment/Plan: 1. Functional deficits secondary to embolic left ACA infarct which require 3+ hours per day of interdisciplinary therapy in a comprehensive inpatient rehab setting. Physiatrist is providing close team supervision and 24 hour management of active medical problems listed below. Physiatrist  and rehab team continue to assess barriers to discharge/monitor patient progress toward functional and medical goals. FIM: FIM - Bathing Bathing Steps Patient Completed: Chest;Right Arm;Left Arm;Abdomen;Right upper leg;Left upper leg Bathing: 3: Mod-Patient completes 5-7 23f10 parts or 50-74%  FIM - Upper Body Dressing/Undressing Upper body dressing/undressing: 0: Wears gown/pajamas-no public clothing FIM - Lower Body Dressing/Undressing Lower body dressing/undressing: 0: Wears gown/pajamas-no public clothing (could not don his socks)  FIM - Toileting Toileting steps completed by patient: Performs perineal hygiene Toileting: 2: Max-Patient completed 1 of 3 steps  FIM - TRadio producerDevices: Grab bars Toilet Transfers: 3-To toilet/BSC: Mod A (lift or lower assist);3-From toilet/BSC: Mod A (lift or lower assist);1-Two helpers  FIM - BIT sales professionalTransfer: 4: Supine > Sit: Min A (steadying Pt. > 75%/lift 1 leg);4: Sit > Supine: Min A (steadying pt. > 75%/lift 1 leg);4: Bed > Chair or W/C: Min A (steadying Pt. > 75%);4: Chair or W/C > Bed: Min A (steadying Pt. > 75%)  FIM - Locomotion: Wheelchair Distance: 150  Comprehension Comprehension Mode: Auditory Comprehension: 5-Understands basic 90% of the time/requires cueing < 10% of the time  Expression Expression Mode: Verbal Expression: 5-Expresses basic needs/ideas: With no assist  Social Interaction Social Interaction: 4-Interacts appropriately 75 - 89% of the time - Needs redirection for appropriate language or to initiate interaction.  Problem Solving Problem Solving: 3-Solves basic 50 - 74% of the time/requires cueing 25 - 49% of the time  Memory Memory: 2-Recognizes or recalls 25 - 49% of the time/requires cueing 51 - 75% of the time Medical Problem List and Plan:  1. Functional deficits secondary to left ACA distribution infarct suspect embolic secondary to unclear source  2.  DVT Prophylaxis/Anticoagulation: Subcutaneous Lovenox. Monitor platelet counts and any signs of bleeding  3. Pain Management: Tylenol as needed  4. Hypertension. Cardizem 360 mg daily, Cardura 2 mg daily, Lopressor 50 mg twice a day. Monitor with increased mobility  5. Neuropsych: This patient is not yet capable of making decisions on his own behalf.  6. Diabetes mellitus with peripheral neuropathy. Hemoglobin A1c 5.7. Glipizide 5 mg daily. Check blood sugars a.c. and at bedtime. Provide diabetic teaching.  7. Hyperlipidemia. Lipitor  8. CAD with stenting. Cardiac enzymes negative. Continue aspirin Plavix therapy. No chest pain or shortness of breath.  9. History of BPH. Check PVRs x3    LOS (Days) 2 A FACE TO FACE EVALUATION WAS PERFORMED  ACharlett Blake5/22/2015, 7:32 AM

## 2014-04-30 NOTE — Progress Notes (Signed)
Physical Therapy Session Note  Patient Details  Name: Nicholas [name] MRN: [id number] Date of Birth: 20-Jul-1951  Today's Date: 04/30/2014 Time: 10:31-11:15 ( )  Short Term Goals: Week 1:  PT Short Term Goal 1 (Week 1): pt will perform basic transfer with assistance of 1 person PT Short Term Goal 2 (Week 1): pt will propel w/c 50' in home setting with supervision PT Short Term Goal 3 (Week 1): pt will move sit> stand with mod assist PT Short Term Goal 4 (Week 1): pt will perform gait x 15' with assistance of 2 people  Skilled Therapeutic Interventions/Progress Updates:  Tx focused on functional mobility training, NMR via forced use and dynamic sitting balance, and pre-gait/gait in // bars. Pt up in WC, feeling good today, but affect still flat. Pt did laugh a few times in therapy, pleased with his performance.  Pt propelled WC to gym with S and cues for efficient technique. Pt needing assist for positioning in [facility] for optimal posture and performance.   Performed multiple reps WC<>mat transfer for instruction and practice with cues for technique and safety. Pt has difficulty with anterior translation over BOS as well as pivoting once up. Assist needed for advancing transfer.   Performed lateral scooting along EOB with Mod A and cues for anterior translation to assist with all functional mobility. Performed forward leaning over LEs until hips unweight.   Performed static standing in // bars, marching in place, and weight shifts for pre-gait training. Pt with L trunk rotation and uneven weight bearing, which improved with mirror present.   Performed gait with Mod A for steadying in // bars 4x8.' Gait marked by decreased L step accuracy and increased ER, decreased heel strike and weight-shifting. Pt unable to adjust without max cues.    Pt left up in [facility] with all needs in reach.      Therapy Documentation Precautions:  Precautions Precautions: Fall Precaution Comments: Pt has  difficulty with reading (education not stroke related); will have difficulty with written materials Restrictions Weight Bearing Restrictions: Yes General:   Vital Signs: Therapy Vitals Pulse Rate: 97 BP: 160/71 mmHg Pain: Pain Assessment Pain Assessment: No/denies pain    Locomotion : Wheelchair Mobility Distance: 150   See FIM for current functional status  Therapy/Group: Individual Therapy Clydene Laming, PT, DPT   04/30/2014, 11:05 AM

## 2014-04-30 NOTE — Progress Notes (Signed)
Social Work Assessment and Plan Social Work Assessment and Plan  Patient Details  Name: Nicholas [name] MRN: [id number] Date of Birth: 07/21/1951  Today's Date: 04/30/2014  Problem List:  Patient Active Problem List   Diagnosis Date Noted  . CVA (cerebral infarction) 04/28/2014  . Stroke 04/26/2014  . Acute encephalopathy 04/26/2014  . Abdominal pain 04/26/2014  . PSVT 09/15/2010  . AORTIC INSUFFICIENCY 08/09/2010  . CLAUDICATION 08/09/2010  . PALPITATIONS 08/09/2010  . PALPITATIONS, HX OF 08/09/2010  . HYPERLIPIDEMIA-MIXED 08/08/2010  . TOBACCO ABUSE 08/08/2010  . HYPERTENSION, UNSPECIFIED 08/08/2010  . CAD, NATIVE VESSEL 08/08/2010  . CAROTID ARTERY DISEASE 08/08/2010   Past Medical History:  Past Medical History  Diagnosis Date  . Hypertension   . Hyperlipidemia   . Type 2 diabetes mellitus   . History of myocardial infarction     1993--  NON-Q WAVE  S/P PCI TO LAD  . History of CVA (cerebrovascular accident)     1993---  NO RESIDUALS  . Hydrocele, left   . BPH (benign prostatic hypertrophy)     HX RETENTION  . PSVT (paroxysmal supraventricular tachycardia)   . Coronary artery disease CARDIOLOGIST--  DR Johney FrameALLRED    S/P  PCI TO LAD 1993  . Carotid artery occlusion     S/P RIGHT CEA  . PVD (peripheral vascular disease)     S/P LEFT FEM-POP  . Left carotid artery stenosis     MILD  . History of head injury     CONCUSSION--  NO RESIDUAL  . Right leg weakness     USES CANE--  SECONDARY TO PVD  . At high risk for falls   . Wears glasses   . Myocardial infarction   . GERD (gastroesophageal reflux disease)    Past Surgical History:  Past Surgical History  Procedure Laterality Date  . Carotid endarterectomy Right 05-31-2003  . Femoral-popliteal bypass graft Left 01-19-2003  . Right hydrocelectomy  05-31-2003  . Coronary angioplasty  1993    PCI TO LAD  . Cardiac catheterization  01-18-2003   DR NISHAN    NON-OBSTRUCTIVE CAD/  PREVIOIS ANGIOPLASTY SITE WIDELY  PATENT  . Cardiovascular stress test  2007    SMALL ANTERO-APICAL SCAR/  NO ISCHEMIA  . Transthoracic echocardiogram  08-24-2010    EF 55%/  MILD AORTIC INSUFFICENCY/  MODERATE LVH  . Hydrocele excision Left 10/27/2013    Procedure: HYDROCELECTOMY ADULT;  Surgeon: Lindaann SloughMarc-Henry Nesi, MD;  [facility] [place];  Service: Urology;  Laterality: Left;   Social History:  reports that he quit smoking about 20 years ago. His smoking use included Cigarettes. He smoked 0.00 packs per day for 0 years. He has never used smokeless tobacco. He reports that he does not drink alcohol or use illicit drugs.  Family / Support Systems Marital Status: Separated Patient Roles: Parent;Other (Comment) Children: Nicholas [name]-daughter  [phone]  [phone] Other Supports: Nicholas [name]  [phone] Anticipated Caregiver: Daughter and son in-law Ability/Limitations of Caregiver: Between them they can provide care-daughter works days, son in-law works evenings. Caregiver Availability: 24/7 Family Dynamics: Pt and wife are recently separated but wife was here visiting him. Pt is on board to go to daughter's home and his need for 24 hr care at discharge.  He is very flat but wife reports he has always been like this even before his first stroke.    Social History Preferred language: English Religion: Baptist Cultural Background: No issues Education: [employer] Read: Yes (Limited) Write: Yes (limited)  Employment Status: Disabled Fish farm manager Issues: No issues Guardian/Conservator: None-according to MD pt is not capable of making his own decisions while here.  Will look toward daughter since she is next of kin, if any decisions need to be made.   Abuse/Neglect Physical Abuse: Denies Verbal Abuse: Denies Sexual Abuse: Denies Exploitation of patient/patient's resources: Denies Self-Neglect: Denies  Emotional Status Pt's affect, behavior adn adjustment status: Pt is flat  and hard to engage, he wants to do well and feels he is not.  He is used to taking care of himself and does not want to be a burden on his family.  He is participating in therapies but wants to progress quicker, than he is. Recent Psychosocial Issues: Other medical issues-was managing on his own before this even with the weakness from his stroke in 1993 Pyschiatric History: No history deferred depression screen due to his tired and falling asleep from therapy.  But will need to have Neuro-psych see due to high risk of dperssion with separation from wife and now this stroke.  Will make referral for this. Substance Abuse History: No issues  Patient / Family Perceptions, Expectations & Goals Pt/Family understanding of illness & functional limitations: Pt and wife have a good understanding of his deficits and this stroke.  He wants to progress quicker but will work on this.  Wife reports he has always been like this glass half empty person.  She is still on good terms with him and reports they are friends.  Pt doesn't say much when she is talking. Premorbid pt/family roles/activities: Father, grandfather, retiree, friend, etc Anticipated changes in roles/activities/participation: resume Pt/family expectations/goals: Pt states; " I want to take care of myself, by the time I leave here."  Wife state: " Our daughter will help him and make sure he has what he needs."  [employer]: None Premorbid Home Care/DME Agencies: None Transportation available at discharge: Daughter provided prior to admission Resource referrals recommended: Support group (specify) (CVA Support group)  Discharge Planning Living Arrangements: Alone Support Systems: Children;Spouse/significant other;Other relatives;Friends/neighbors Type of Residence: Private residence Insurance Resources: [employer] Financial Resources: SSD Financial Screen Referred: No Living Expenses: Rent Money Management:  Patient Does the patient have any problems obtaining your medications?: No Home Management: Self Patient/Family Preliminary Plans: Plan now is to go to daughter's home and have she and her husband provide care to him.  Daughter is a Runner, broadcasting/film/video and works days while son in-law works evenings, so someone will be there 24 hr.  Pt would like to eventually retrun to his home alone if he can get to that level.  Wife is emotionally supportive. Social Work Anticipated Follow Up Needs: HH/OP;Support Group  Clinical Impression Pleasant flat gentleman who answers questions when directly asked but doesn't engage in conversation.  Would benefit from Neuro-psych intervention. Have spoken with wife, but will not be a caregiver at discharge.  Will talk with daughter and come up with a safe discharge plan.  Nicholas [name] 04/30/2014, 3:34 PM

## 2014-05-01 ENCOUNTER — Inpatient Hospital Stay (HOSPITAL_COMMUNITY): Payer: Medicare Other | Admitting: Speech Pathology

## 2014-05-01 ENCOUNTER — Inpatient Hospital Stay (HOSPITAL_COMMUNITY): Payer: Medicare Other

## 2014-05-01 ENCOUNTER — Inpatient Hospital Stay (HOSPITAL_COMMUNITY): Payer: Medicare Other | Admitting: Occupational Therapy

## 2014-05-01 DIAGNOSIS — I6330 Cerebral infarction due to thrombosis of unspecified cerebral artery: Secondary | ICD-10-CM

## 2014-05-01 DIAGNOSIS — G8110 Spastic hemiplegia affecting unspecified side: Secondary | ICD-10-CM

## 2014-05-01 LAB — GLUCOSE, CAPILLARY
GLUCOSE-CAPILLARY: 102 mg/dL — AB (ref 70–99)
GLUCOSE-CAPILLARY: 84 mg/dL (ref 70–99)
Glucose-Capillary: 95 mg/dL (ref 70–99)
Glucose-Capillary: 149 mg/dL — ABNORMAL HIGH (ref 70–99)

## 2014-05-01 MED ORDER — DILTIAZEM HCL ER COATED BEADS 360 MG PO CP24
360.0000 mg | ORAL_CAPSULE | Freq: Every day | ORAL | Status: DC
  Administered 2014-05-01 – 2014-05-15 (×15): 360 mg via ORAL
  Filled 2014-05-01 (×19): qty 1

## 2014-05-01 NOTE — Progress Notes (Signed)
Physical Therapy Session Note  Patient Details  Name: Nicholas [name] MRN: [id number] Date of Birth: 26-Apr-1951  Today's Date: 05/01/2014 Time: 1100-1200 Time Calculation (min): 60 min  Short Term Goals: Week 1:  PT Short Term Goal 1 (Week 1): pt will perform basic transfer with assistance of 1 person PT Short Term Goal 2 (Week 1): pt will propel w/c 50' in home setting with supervision PT Short Term Goal 3 (Week 1): pt will move sit> stand with mod assist PT Short Term Goal 4 (Week 1): pt will perform gait x 15' with assistance of 2 people  Skilled Therapeutic Interventions/Progress Updates:    Pt received sitting in w/c upon entering room and agreeable to PT. Pt propelled wheel chair in hallways 150 feet using BUE at supervision level with mod cues for safety and to avoid obstacles. Gait initiated in parallel bars with pt requiring only min A for sit to stand/stand to sit transfers. Pt ambulated 20 feet x1 in parallel bars with min A and mod cues for sequencing and to increased stride length, hip and knee flexion of the LLE. Gait progressed to over ground with use of RW, requiring min/mod A and mod/max cues for sequencing, increased stride length bilaterally and overall safety awareness with pt ambulating 65 feet x1. Pt instructed in negotiation of stairs, completing 3 steps with bilateral rails with mod A and cues for sequencing, foot and hand placement. Pt's daughter present during gait and stair negotiation, reports pt's gait pattern appears close to his baseline and pt does not have stairs to negotiate at the home, with a level entry. Pt completed seated ther ex, including toe raises, heel raises, LAQ, marching, and hip abd against manual resistance. Pt required AAROM with LLE toe raises, heel raises. Pt completed 2x10 of all exercises. Pt returned to room and requested to remain in w/c, reporting the bed hurts his bottom. All need met and within reach, quick release belt in place. Dtr and  family present in room.   Therapy Documentation Precautions:  Precautions Precautions: Fall Precaution Comments: Pt has difficulty with reading (education not stroke related); will have difficulty with written materials Restrictions Weight Bearing Restrictions: Yes Pain: Pain Assessment Pain Assessment: No/denies pain Pain Score: 0-No pain   See FIM for current functional status  Therapy/Group: Individual Therapy  Lisset Ketchem R Tonnie Friedel 05/01/2014, 12:34 PM

## 2014-05-01 NOTE — Progress Notes (Signed)
Occupational Therapy Session Note  Patient Details  Name: Nicholas [name] MRN: [id number] Date of Birth: 08-16-1951  Today's Date: 05/01/2014 Time: 0800-0900 and 1330-1415 Time Calculation (min): 60 min and 45 min  Short Term Goals: Week 1:  OT Short Term Goal 1 (Week 1): Pt will transfer to toilet from w/c with mod A x1 using grab bars. OT Short Term Goal 2 (Week 1): Pt will don a shirt with verbal cues only. OT Short Term Goal 3 (Week 1): Pt will don pants over feet with min A and pull over hips with mod A. OT Short Term Goal 4 (Week 1): Pt will stand from EOB and stand with RW with min A. OT Short Term Goal 5 (Week 1): Pt will bathe with min A.  Skilled Therapeutic Interventions/Progress Updates:    Visit 1:  No c/o pain.   Pt seen for BADL retraining of bathing at shower level and dressing with a focus on problem solving, motor planning and balance. Pt much more alert, but is not fully oriented to month and year.  He was agreeable to shower this am. Pt was in wc and completed stand pivot using grab bars to step into shower stall with min A. Pt completed shower and then LB dressing with steadying assist.  He stood and fastened buttons on pants and fastened his belt. He donned his TED hose independently. Pt then worked on 3 sets of w/c pushups x 10 and 3 sets of squats x 10 using UE support on sink to strengthen LEs.  Pt resting in w/c with QRB on and call light in reach.  Visit 2:  No c/o pain. Pt in w/c at start of session. Pt seen this session to address BR mobility with a RW and therapeutic activities to increase engagement, endurance, and response time.  Pt ambulated with RW with mod A in and out of the bathroom shuffling his feet. He needed max cuing to position walker safely and turn it appropriately to navigate in and out of the bathroom.  Pt is not safe to use RW into bathroom at this time, should continue with w/c stand pivot transfers.  Pt taken to gym to use UBE for 10 minutes  focusing on speed of movement. Pt needed several prompts to move his arms faster.  Pt then stood at tall table to engage in checkers game, but pt said he could not see the board, He could see checkers on the board but the squares were blurred together.  Pt taken back to his room with QRB and call light in reach.  Therapy Documentation Precautions:  Precautions Precautions: Fall Precaution Comments: Pt has difficulty with reading (education not stroke related); will have difficulty with written materials Restrictions Weight Bearing Restrictions: Yes    Vital Signs: Therapy Vitals Pulse Rate: 80 BP: 130/78 mmHg Pain: Pain Assessment Pain Assessment: No/denies pain Pain Score: 0-No pain ADL: ADL ADL Comments: Refer to FIM  See FIM for current functional status  Therapy/Group: Individual Therapy  Earle Gell 05/01/2014, 12:46 PM

## 2014-05-01 NOTE — Progress Notes (Signed)
Speech Language Pathology Daily Session Note  Patient Details  Name: Nicholas [name] MRN: [id number] Date of Birth: 24-Sep-1951  Today's Date: 05/01/2014 Time: 1500-1530 Time Calculation (min): 30 min  Short Term Goals: Week 1: SLP Short Term Goal 1 (Week 1): Patient will demonstrate basic problem solving during self-care tasks with Min verbal cues SLP Short Term Goal 2 (Week 1): Patient will sustain attention to basic self-care tasks for 5 minutes with Mod cues SLP Short Term Goal 3 (Week 1): Patient will label 3 deficits with Mod question cues SLP Short Term Goal 4 (Week 1): Patient will tolerate regula textures and thin liquids with Mod I  SLP Short Term Goal 5 (Week 1): Patient will follow multi-step commands during functional tasks with Min vebral cues  Skilled Therapeutic Interventions: Skilled ST intervention provided with focus on cognitive goals. Pt stated that he was tired and was noted with decreased participation, requiring max encouragement to engage in tx tasks. Pt noted with poor money management skills. Pt states that he mainly uses cash and has help with his finances. Pt was 25% accurate with counting small amounts of money. Pt required max assist to calculate change and which bills to give "cashier". Pt states that this is different and he did not have this much trouble before.    FIM:  Comprehension Comprehension Mode: Auditory Comprehension: 5-Understands basic 90% of the time/requires cueing < 10% of the time Expression Expression Mode: Verbal Expression: 5-Expresses basic needs/ideas: With extra time/assistive device Social Interaction Social Interaction: 4-Interacts appropriately 75 - 89% of the time - Needs redirection for appropriate language or to initiate interaction. Problem Solving Problem Solving: 3-Solves basic 50 - 74% of the time/requires cueing 25 - 49% of the time Memory Memory: 3-Recognizes or recalls 50 - 74% of the time/requires cueing 25 - 49% of  the time FIM - Eating Eating Activity: 5: Set-up assist for open containers  Pain Pain Assessment Pain Assessment: No/denies pain  Therapy/Group: Individual Therapy  Kara Pacer Linken Mcglothen 05/01/2014, 3:31 PM

## 2014-05-01 NOTE — Progress Notes (Signed)
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 Patient lives alone independent with a cane prior to admission. Admitted 04/26/2014 and altered mental status.  MRI with acute nonhemorrhagic left ACA territory infarct as well as remote right parietal lobe infarct. 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 Physical occupational therapy evaluations completed 04/27/2014 recommendations of physical medicine rehabilitation consult.  Subjective/Complaints: Denies complaints.   Objective: Vital Signs: Blood pressure 130/78, pulse 80, temperature 98.5 F (36.9 C), temperature source Oral, resp. rate 18, height 6' 0.05" (1.83 m), weight 235 lb 3.7 oz (106.7 kg), SpO2 94.00%.  No acute distress. Chest clear to auscultation. Cardiac exam S1-S2 are regular. Abdominal exam active bowel sounds, soft, obese. Extremities no edema.  Assessment/Plan: 1. 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 Functional deficits secondary to left ACA distribution infarct suspect embolic secondary to unclear source  2. DVT Prophylaxis/Anticoagulation: Subcutaneous Lovenox. Monitor platelet counts and any signs of bleeding  3. Pain Management: Tylenol as needed  4. Hypertension. Blood pressure ranges from 36-160/71 5. Neuropsych: This patient is not yet capable of making decisions on his own behalf.  6. Diabetes mellitus with peripheral neuropathy. Controlled currently. CBG (last 3)   Recent Labs  04/30/14 1649 04/30/14 2058 05/01/14 0741  GLUCAP 85 122* 102*   7. Hyperlipidemia. Lipitor   8. CAD with stenting. Cardiac enzymes negative. Continue aspirin Plavix therapy. No chest pain or shortness of breath.  9. History of BPH. Check PVRs x3    LOS (Days) 3 A FACE TO FACE EVALUATION WAS PERFORMED  Nicholas [name] 05/01/2014, 9:35 AM

## 2014-05-02 ENCOUNTER — Inpatient Hospital Stay (HOSPITAL_COMMUNITY): Payer: Medicare Other | Admitting: Physical Therapy

## 2014-05-02 LAB — GLUCOSE, CAPILLARY
GLUCOSE-CAPILLARY: 81 mg/dL (ref 70–99)
Glucose-Capillary: 106 mg/dL — ABNORMAL HIGH (ref 70–99)
Glucose-Capillary: 132 mg/dL — ABNORMAL HIGH (ref 70–99)
Glucose-Capillary: 116 mg/dL — ABNORMAL HIGH (ref 70–99)

## 2014-05-02 NOTE — Progress Notes (Signed)
Subjective/Complaints: Patient has no complaints. He states he feels well. He has a good appetite.   Objective: Vital Signs: Blood pressure 155/73, pulse 67, temperature 98.4 F (36.9 C), temperature source Oral, resp. rate 19, height 6' 0.05" (1.83 m), weight 235 lb 3.7 oz (106.7 kg), SpO2 97.00%.  N no distress. Chest clear to auscultation. HEENT exam: Atraumatic, normocephalic Cardiac exam S1-S2 are regular Abdominal exam active bowel sounds, soft  Assessment/Plan: 1. 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 Functional deficits secondary to left ACA distribution infarct suspect embolic secondary to unclear source  2. DVT Prophylaxis/Anticoagulation: Subcutaneous Lovenox. Monitor platelet counts and any signs of bleeding  3. Pain Management: Tylenol as needed  4. Hypertension. Blood pressure ranges from 130-155/73-78 5. Neuropsych: This patient is not yet capable of making decisions on his own behalf.  6. Diabetes mellitus with peripheral neuropathy. Controlled currently. CBG (last 3)   Recent Labs  05/01/14 1703 05/01/14 2028 05/02/14 0755  GLUCAP 84 149* 106*   continue current medications. 7. Hyperlipidemia. Lipitor  8. CAD with stenting. Cardiac enzymes negative. Continue aspirin Plavix therapy. No chest pain or shortness of breath.  9. History of BPH. Check PVRs x3    LOS (Days) 4 A FACE TO FACE EVALUATION WAS PERFORMED  Aldred Mase H Ameliyah Sarno 05/02/2014, 9:13 AM

## 2014-05-02 NOTE — Progress Notes (Signed)
Physical Therapy Session Note  Patient Details  Name: Nicholas [name] MRN: [id number] Date of Birth: August 11, 1951  Today's Date: 05/02/2014 Time: 5885-0277 Time Calculation (min): 45 min  Short Term Goals: Week 1:  PT Short Term Goal 1 (Week 1): pt will perform basic transfer with assistance of 1 person PT Short Term Goal 2 (Week 1): pt will propel w/c 50' in home setting with supervision PT Short Term Goal 3 (Week 1): pt will move sit> stand with mod assist PT Short Term Goal 4 (Week 1): pt will perform gait x 15' with assistance of 2 people  Skilled Therapeutic Interventions/Progress Updates:   Pt received resting in w/c, wife present. When asked to propel w/c, pt initially refusing but agreeable with max coaxing. W/c mobility using BUEs x 150 ft and min A for steering/propulsion. Gait training x 20 ft, x 15 ft and x 30 ft using RW with min-mod A for managing RW and vc's for safety, increased B step length, and maintaining midline, mirror for visual feedback. Gait training x 55 ft with min A for controlling RW. Pt demo decreased shuffling and improved step length with increased practice. Pt requires mod A and max vc's for safety and hand placement for turning to complete transfers using RW throughout session. Pt performed pipe tree activity while standing with 1 UE support on raised table. Pt initially planning appropriately taking out necessary pieces but requires max cues to piece together correctly. Pt able to maintain standing with supervision x 15 min to complete activity. Pt returned to room and left sitting in w/c with wife and RN tech present.   Therapy Documentation Precautions:  Precautions Precautions: Fall Precaution Comments: Pt has difficulty with reading (education not stroke related); will have difficulty with written materials Restrictions Weight Bearing Restrictions: Yes Pain:  Denies pain Locomotion : Ambulation Ambulation/Gait Assistance: 4: Min assist Wheelchair  Mobility Distance: 150   See FIM for current functional status  Therapy/Group: Individual Therapy  [name] 05/02/2014, 4:57 PM

## 2014-05-03 ENCOUNTER — Inpatient Hospital Stay (HOSPITAL_COMMUNITY): Payer: Medicare Other | Admitting: Occupational Therapy

## 2014-05-03 ENCOUNTER — Inpatient Hospital Stay (HOSPITAL_COMMUNITY): Payer: Medicare Other | Admitting: *Deleted

## 2014-05-03 ENCOUNTER — Inpatient Hospital Stay (HOSPITAL_COMMUNITY): Payer: Medicare Other | Admitting: Speech Pathology

## 2014-05-03 LAB — GLUCOSE, CAPILLARY
GLUCOSE-CAPILLARY: 94 mg/dL (ref 70–99)
Glucose-Capillary: 113 mg/dL — ABNORMAL HIGH (ref 70–99)
Glucose-Capillary: 76 mg/dL (ref 70–99)
Glucose-Capillary: 235 mg/dL — ABNORMAL HIGH (ref 70–99)

## 2014-05-03 NOTE — Progress Notes (Signed)
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 Patient lives alone independent with a cane prior to admission. Admitted 04/26/2014 and altered mental status.  MRI with acute nonhemorrhagic left ACA territory infarct as well as remote right parietal lobe infarct. 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 Physical occupational therapy evaluations completed 04/27/2014 recommendations of physical medicine rehabilitation consult.  Subjective/Complaints: Feel constipated, "went a couple days ago" Denies abd pain, N/V  Review of Systems - unable to obtain secondary to mental status  Objective: Vital Signs: Blood pressure 139/54, pulse 81, temperature 98 F (36.7 C), temperature source Oral, resp. rate 20, height 6' 0.05" (1.83 m), weight 106.7 kg (235 lb 3.7 oz), SpO2 94.00%. No results found. Results for orders placed during the hospital encounter of 04/28/14 (from the past 72 hour(s))  GLUCOSE, CAPILLARY     Status: Abnormal   Collection Time    04/30/14 12:03 PM      Result Value Ref Range   Glucose-Capillary 151 (*) 70 - 99 mg/dL   Comment 1 Notify RN    GLUCOSE, CAPILLARY     Status: None   Collection Time    04/30/14  4:49 PM      Result Value Ref Range   Glucose-Capillary 85  70 - 99 mg/dL   Comment 1 Notify RN    GLUCOSE, CAPILLARY     Status: Abnormal   Collection Time    04/30/14  8:58 PM      Result Value Ref Range   Glucose-Capillary 122 (*) 70 - 99 mg/dL  GLUCOSE, CAPILLARY     Status: Abnormal   Collection Time    05/01/14  7:41 AM      Result Value Ref Range   Glucose-Capillary 102 (*) 70 - 99  mg/dL  GLUCOSE, CAPILLARY     Status: None   Collection Time    05/01/14 11:58 AM      Result Value Ref Range   Glucose-Capillary 95  70 - 99 mg/dL  GLUCOSE, CAPILLARY     Status: None   Collection Time    05/01/14  5:03 PM      Result Value Ref Range   Glucose-Capillary 84  70 - 99 mg/dL  GLUCOSE, CAPILLARY     Status: Abnormal   Collection Time    05/01/14  8:28 PM      Result Value Ref Range   Glucose-Capillary 149 (*) 70 - 99 mg/dL  GLUCOSE, CAPILLARY     Status: Abnormal   Collection Time    05/02/14  7:55 AM      Result Value Ref Range   Glucose-Capillary 106 (*) 70 - 99 mg/dL  GLUCOSE, CAPILLARY     Status: Abnormal   Collection Time    05/02/14 11:32 AM      Result Value Ref Range   Glucose-Capillary 132 (*) 70 - 99 mg/dL  GLUCOSE, CAPILLARY     Status: None   Collection Time    05/02/14  5:07 PM      Result Value Ref Range   Glucose-Capillary 81  70 - 99 mg/dL  GLUCOSE, CAPILLARY     Status: Abnormal   Collection Time    05/02/14  9:17  PM      Result Value Ref Range   Glucose-Capillary 116 (*) 70 - 99 mg/dL  GLUCOSE, CAPILLARY     Status: Abnormal   Collection Time    05/03/14  7:14 AM      Result Value Ref Range   Glucose-Capillary 113 (*) 70 - 99 mg/dL   Comment 1 Notify RN        Head: Normocephalic.  Eyes:  Pupils reactive to light  Neck: Normal range of motion. Neck supple. No thyromegaly present.  Cardiovascular: Normal rate and regular rhythm.  Respiratory: Effort normal and breath sounds normal. No respiratory distress.  GI: Soft. Bowel sounds are normal. He exhibits no distension.  Neurological:  Flat affect.Oriented to person,place,age and DOB.Follows simple commands .Fair awareness of deficits  Skin: Skin is warm and dry.  Scar right side of neck  motor strength is 5/5 bilateral deltoid, bicep, tricep, grip  Minimal dysmetria bilaterally finger-nose-finger  Right lower extremity 4/5 hip flexor knee extensor ankle dorsi flexion plantar  flexor  Left extremity 4/5 in the left hip flexor knee extensor 3 minus ankle dorsiflexor plantar flexor  Sensation intact to light touch R. upper and lower limbs  Patient is not oriented to Month day or date   Assessment/Plan: 1. Functional deficits secondary to embolic left ACA infarct which require 3+ hours per day of interdisciplinary therapy in a comprehensive inpatient rehab setting. Physiatrist is providing close team supervision and 24 hour management of active medical problems listed below. Physiatrist and rehab team continue to assess barriers to discharge/monitor patient progress toward functional and medical goals. FIM: FIM - Bathing Bathing Steps Patient Completed: Chest;Right Arm;Left Arm;Abdomen;Right upper leg;Left upper leg;Front perineal area;Buttocks;Right lower leg (including foot);Left lower leg (including foot) Bathing: 4: Steadying assist  FIM - Upper Body Dressing/Undressing Upper body dressing/undressing steps patient completed: Thread/unthread left sleeve of pullover shirt/dress;Put head through opening of pull over shirt/dress;Thread/unthread right sleeve of pullover shirt/dresss;Pull shirt over trunk Upper body dressing/undressing: 5: Set-up assist to: Obtain clothing/put away FIM - Lower Body Dressing/Undressing Lower body dressing/undressing steps patient completed: Thread/unthread right pants leg;Thread/unthread left pants leg;Don/Doff right shoe;Don/Doff left shoe;Pull pants up/down;Fasten/unfasten pants;Don/Doff right sock;Don/Doff left sock (Pt donned his own TED hose.) Lower body dressing/undressing: 4: Steadying Assist  FIM - Toileting Toileting steps completed by patient: Performs perineal hygiene Toileting: 2: Max-Patient completed 1 of 3 steps  FIM - Diplomatic Services operational officer Devices: Grab bars Toilet Transfers: 4-To toilet/BSC: Min A (steadying Pt. > 75%);4-From toilet/BSC: Min A (steadying Pt. > 75%)  FIM - Physiological scientist Devices: Walker;Arm rests Bed/Chair Transfer: 4: Bed > Chair or W/C: Min A (steadying Pt. > 75%);4: Chair or W/C > Bed: Min A (steadying Pt. > 75%)  FIM - Locomotion: Wheelchair Distance: 150 Locomotion: Wheelchair: 4: Travels 150 ft or more: maneuvers on rugs and over door sillls with minimal assistance (Pt.>75%) FIM - Locomotion: Ambulation Locomotion: Ambulation Assistive Devices: Designer, industrial/product Ambulation/Gait Assistance: 4: Min assist Locomotion: Ambulation: 2: Travels 50 - 149 ft with minimal assistance (Pt.>75%)  Comprehension Comprehension Mode: Auditory Comprehension: 5-Follows basic conversation/direction: With extra time/assistive device  Expression Expression Mode: Verbal Expression: 5-Expresses basic needs/ideas: With extra time/assistive device  Social Interaction Social Interaction: 4-Interacts appropriately 75 - 89% of the time - Needs redirection for appropriate language or to initiate interaction.  Problem Solving Problem Solving: 4-Solves basic 75 - 89% of the time/requires cueing 10 - 24% of the time  Memory Memory: 3-Recognizes  or recalls 50 - 74% of the time/requires cueing 25 - 49% of the time Medical Problem List and Plan:  1. Functional deficits secondary to left ACA distribution infarct suspect embolic secondary to unclear source  2. DVT Prophylaxis/Anticoagulation: Subcutaneous Lovenox. Monitor platelet counts and any signs of bleeding  3. Pain Management: Tylenol as needed  4. Hypertension. Cardizem 360 mg daily, Cardura 2 mg daily, Lopressor 50 mg twice a day. Monitor with increased mobility  5. Neuropsych: This patient is not yet capable of making decisions on his own behalf.  6. Diabetes mellitus with peripheral neuropathy. Hemoglobin A1c 5.7. Glipizide 5 mg daily. Check blood sugars a.c. and at bedtime. Provide diabetic teaching.  7. Hyperlipidemia. Lipitor  8. CAD with stenting. Cardiac enzymes negative.  Continue aspirin Plavix therapy. No chest pain or shortness of breath.  9. History of BPH. Check PVRs x3 10.  Left foot drop- pt gives hx of back problem causing Left foot weakness but also has had prior Right parietal CVA, will ask PT to trial an AFO   LOS (Days) 5 A FACE TO FACE EVALUATION WAS PERFORMED  Erick Colace 05/03/2014, 8:37 AM

## 2014-05-03 NOTE — Plan of Care (Signed)
Problem: RH SAFETY Goal: RH STG ADHERE TO SAFETY PRECAUTIONS W/ASSISTANCE/DEVICE STG Adhere to Safety Precautions With Min Assistance/Device.  Outcome: Not Progressing Requires mod assist patient transferring self to bed at times

## 2014-05-03 NOTE — Progress Notes (Signed)
Occupational Therapy Session Note  Patient Details  Name: Nicholas [name] MRN: [id number] Date of Birth: 18-Sep-1951  Today's Date: 05/03/2014 Time: 0805-0900 Time Calculation (min): 55 min  Short Term Goals: Week 1:  OT Short Term Goal 1 (Week 1): Pt will transfer to toilet from w/c with mod A x1 using grab bars. OT Short Term Goal 2 (Week 1): Pt will don a shirt with verbal cues only. OT Short Term Goal 3 (Week 1): Pt will don pants over feet with min A and pull over hips with mod A. OT Short Term Goal 4 (Week 1): Pt will stand from EOB and stand with RW with min A. OT Short Term Goal 5 (Week 1): Pt will bathe with min A.      Skilled Therapeutic Interventions/Progress Updates:      Pt seen for BADL retraining of toileting, bathing, and dressing with a focus on safe functional mobility, balance, attention, awareness. Pt's balance has improved so that he only needs supervision with his self care, but he continues to need min A with transfers using RW and/or stand pivot due to decreased LLE strength and mobility.  Pt states that he is feeling good about his progress. Pt continues to have difficulty with awareness and orientation.  Pt resting in w/c at end of session with call light in reach.    Therapy Documentation Precautions:   Precautions Precautions: Fall Precaution Comments: Pt has difficulty with reading (education not stroke related); will have difficulty with written materials Restrictions Weight Bearing Restrictions: Yes      Pain: Pain Assessment Pain Assessment: No/denies pain ADL: ADL ADL Comments: Refer to FIM      See FIM for current functional status  Therapy/Group: Individual Therapy  Stark Bray Saguier 05/03/2014, 9:02 AM

## 2014-05-03 NOTE — Progress Notes (Signed)
Speech Language Pathology Daily Session Note  Patient Details  Name: Nicholas [name] MRN: [id number] Date of Birth: 1951-10-03  Today's Date: 05/03/2014 Time: 1418-1500 Time Calculation (min): 42 min  Short Term Goals: Week 1: SLP Short Term Goal 1 (Week 1): Patient will demonstrate basic problem solving during self-care tasks with Min verbal cues SLP Short Term Goal 2 (Week 1): Patient will sustain attention to basic self-care tasks for 5 minutes with Mod cues SLP Short Term Goal 3 (Week 1): Patient will label 3 deficits with Mod question cues SLP Short Term Goal 4 (Week 1): Patient will tolerate regula textures and thin liquids with Mod I  SLP Short Term Goal 5 (Week 1): Patient will follow multi-step commands during functional tasks with Min vebral cues  Skilled Therapeutic Interventions: Skilled treatment session focused on addressing cognition goals in relation to medication management.  Patient required Total assist for problem solving related to AM versus PM; however, patient reported that he only took medication all at once PTA.  Patient required Mod assist to utilize external aid for recall and to identify errors related to loading the box.   Continue with current plan of care.    FIM:  Comprehension Comprehension Mode: Auditory Comprehension: 5-Follows basic conversation/direction: With extra time/assistive device Expression Expression Mode: Verbal Expression: 5-Expresses basic needs/ideas: With extra time/assistive device Social Interaction Social Interaction: 4-Interacts appropriately 75 - 89% of the time - Needs redirection for appropriate language or to initiate interaction. Problem Solving Problem Solving: 4-Solves basic 75 - 89% of the time/requires cueing 10 - 24% of the time Memory Memory: 3-Recognizes or recalls 50 - 74% of the time/requires cueing 25 - 49% of the time  Pain Pain Assessment Pain Assessment: No/denies pain  Therapy/Group: Individual  Therapy  Charlane Ferretti., CCC-SLP [phone]  Ophelia Shoulder 05/03/2014, 3:48 PM

## 2014-05-03 NOTE — Progress Notes (Signed)
Physical Therapy Session Note  Patient Details  Name: Nicholas [name] MRN: [id number] Date of Birth: 01/29/1951  Today's Date: 05/03/2014 Time: 10:05-11:00 ( ) and 13:02-13:30 ( )   Short Term Goals: Week 1:  PT Short Term Goal 1 (Week 1): pt will perform basic transfer with assistance of 1 person PT Short Term Goal 2 (Week 1): pt will propel w/c 50' in home setting with supervision PT Short Term Goal 3 (Week 1): pt will move sit> stand with mod assist PT Short Term Goal 4 (Week 1): pt will perform gait x 15' with assistance of 2 people  Skilled Therapeutic Interventions/Progress Updates:  First tx focused on gait training with various bracing devices, stairs, LLE therapeutic stretching, and transfer training. During gait and mobility, LLE movements are marked by increased compensation of adductors and ER with medial whip to accomplish swing through. Pt has tight adductor muscles and weak hip abductors, so will continue tx focusing on these impairments to improve safety with all aspects of functional mobility.   WC propulsion x150' with min A during turns and cues for technique. Pt needed Min A for transfers throught tx for anterior translation and cues for safe technique.   Gait trials with no brace, [place] PLS, and [place] for LLE all x50' with min steadying assist and cues for L swing alignment. Pt begins walking with shuffling steps, which improves in length with practice, however ER and whip present throughout and were actually exaccerbated with bracing. Will continue gait training with RW and no brace at this time, but will continue to assess.   Performed manual stretching of hip rotators, HS and heel cords in sitting x57min each group.   Performed stair training with 2 rails and min A x5 steps with cues for safe sequence. Despite step-to cues, pt continues to ascend with reciprocal pattern. Pt left up in [facility] with all needs in reach. Will discuss with team about trialing  for discontinuing lap belt.   Second tx focused on NRM in supine, L hip stretching and strengthening, as well as transfers.  Performed WC<>bed transfers with Mod A for steadying during stepping turn with decreased weight-shifting.  Sit<>supine with S.  Performed manual stretching of hip ADD and ERs x75min, and strengthening with clamshells, bridges, and PNF diagonals 2x15 each with cues for technique.   Trialed no lap belt until SLP this afternoon.       Therapy Documentation Precautions:  Precautions Precautions: Fall Precaution Comments: Pt has difficulty with reading (education not stroke related); will have difficulty with written materials Restrictions Weight Bearing Restrictions: Yes    Vital Signs: Therapy Vitals Pulse Rate: 88 BP: 148/70 mmHg Pain: Pain Assessment Pain Assessment: No/denies pain  See FIM for current functional status  Therapy/Group: Individual Therapy Clydene Laming, PT, DPT   05/03/2014, 10:41 AM

## 2014-05-04 ENCOUNTER — Inpatient Hospital Stay (HOSPITAL_COMMUNITY): Payer: Medicare Other | Admitting: *Deleted

## 2014-05-04 ENCOUNTER — Inpatient Hospital Stay (HOSPITAL_COMMUNITY): Payer: Medicare Other | Admitting: Occupational Therapy

## 2014-05-04 ENCOUNTER — Inpatient Hospital Stay (HOSPITAL_COMMUNITY): Payer: Medicare Other | Admitting: Speech Pathology

## 2014-05-04 DIAGNOSIS — I69919 Unspecified symptoms and signs involving cognitive functions following unspecified cerebrovascular disease: Secondary | ICD-10-CM

## 2014-05-04 DIAGNOSIS — I6330 Cerebral infarction due to thrombosis of unspecified cerebral artery: Secondary | ICD-10-CM

## 2014-05-04 LAB — GLUCOSE, CAPILLARY
GLUCOSE-CAPILLARY: 92 mg/dL (ref 70–99)
GLUCOSE-CAPILLARY: 99 mg/dL (ref 70–99)
Glucose-Capillary: 100 mg/dL — ABNORMAL HIGH (ref 70–99)
Glucose-Capillary: 79 mg/dL (ref 70–99)

## 2014-05-04 MED ORDER — ALUM & MAG HYDROXIDE-SIMETH 200-200-20 MG/5ML PO SUSP: 15.0000 mL | Freq: Four times a day (QID) | ORAL | Status: DC | PRN

## 2014-05-04 NOTE — Progress Notes (Signed)
Speech Language Pathology Daily Session Note  Patient Details  Name: Nicholas [name] MRN: [id number] Date of Birth: 07-Feb-1951  Today's Date: 05/04/2014 Time: 1050-1130 Time Calculation (min): 40 min  Short Term Goals: Week 1: SLP Short Term Goal 1 (Week 1): Patient will demonstrate basic problem solving during self-care tasks with Min verbal cues SLP Short Term Goal 2 (Week 1): Patient will sustain attention to basic self-care tasks for 5 minutes with Mod cues SLP Short Term Goal 3 (Week 1): Patient will label 3 deficits with Mod question cues SLP Short Term Goal 4 (Week 1): Patient will tolerate regula textures and thin liquids with Mod I  SLP Short Term Goal 5 (Week 1): Patient will follow multi-step commands during functional tasks with Min vebral cues  Skilled Therapeutic Interventions: Pt was seen for skilled speech therapy targeting executive function and sustained attention.  Pt benefitted from mod faded to min assist verbal and visual cuing to initiate and sustain attention to a structured card game for approximately 3-5 minutes at time before requiring redirection.  Pt was noted with decreased recall of daily events and information and required max assist to identify at least one goal of PT, OT, or speech therapy.  Pt initially presented with flat affect but was noted to brighten by the end of the session in response to humor.  Continue per current plan of care.     FIM:  Comprehension Comprehension Mode: Auditory Comprehension: 5-Follows basic conversation/direction: With extra time/assistive device Expression Expression Mode: Verbal Expression: 5-Expresses basic 90% of the time/requires cueing < 10% of the time. Social Interaction Social Interaction: 4-Interacts appropriately 75 - 89% of the time - Needs redirection for appropriate language or to initiate interaction. Problem Solving Problem Solving: 3-Solves basic 50 - 74% of the time/requires cueing 25 - 49% of the  time Memory Memory: 2-Recognizes or recalls 25 - 49% of the time/requires cueing 51 - 75% of the time  Pain Pain Assessment Pain Assessment: No/denies pain  Therapy/Group: Individual Therapy  Jackalyn Lombard, M.A. CCC-SLP  Nicholas [name] 05/04/2014, 1:09 PM

## 2014-05-04 NOTE — Progress Notes (Signed)
Physical Therapy Session Note  Patient Details  Name: Nicholas [name] MRN: [id number] Date of Birth: May 01, 1951  Today's Date: 05/04/2014 Time:  9:35-10:35 ( ) 13:02-13:30 ( )   Short Term Goals: Week 1:  PT Short Term Goal 1 (Week 1): pt will perform basic transfer with assistance of 1 person PT Short Term Goal 2 (Week 1): pt will propel w/c 50' in home setting with supervision PT Short Term Goal 3 (Week 1): pt will move sit> stand with mod assist PT Short Term Goal 4 (Week 1): pt will perform gait x 15' with assistance of 2 people  Skilled Therapeutic Interventions/Progress Updates:  First tx focused on NMR, functional mobility, and gait in controlled setting. Pt up in [facility] without complaints.   Pt propelled WC x50' with S using bil UE and LEs fo rincreased coordination. Pt with difficutly using alternating pattern.  Performed al transfers with min/mod A   Gait in controlled setting x100' with min A and RW, 2x25' with HHA for challenge and NMR. Pt needing cues to increase step length and IR LLE due to kicking RW, but unable to maintain adjustments due to longstanding method since old CVA.   NMR via forced use, manual facilitation and multi-modal cues in sitting and standing:  -  lateral walking at hall rail [phone] with min A and cues for technique to increase hip ABD strength. Pt still unablt to maintain LLE alignment.   - dynamic sitting for increased pelvic mobility and trunk shortening/lengthenign, reaching under hips and placing objects outside BOS and anteriorly on L.   - attempted [place] sit<>stands for L-sided activaiton, but pt needing max a to avoid using RLE compensatory strategies.   - Unsupported standing and reaching/placing outside BOS. Pt unable to reach outside L BOS in standing wtihout UE support.   - // bars step taps and static standing on 2" >>6" step for forced use and coordination, removing 1UE as able. Pt improved performance and balance with practice  2x25 each side.   Nustep x89min with bil LEs only level 4 for increased balanced muscle activation and coordination.  Pt lef tup in [facility] with all needs in reach, continuing to trial no belt.   Second tx focused on NMR and gait training with LiteGait treadmill training system, as well as gait with SPC for increased challenge.   Pt needing min A overall for balance during functional mobility and safety cues for hand placement. Pt able to stand with close S and bil UE support x11min during harnessing.   Treadmill training x2.2min at u pto 0.2mph for 150.' Therapist assisting ith LLE step length and heel strike for NMR, but ultimately pt uncomfortable in harness and not willing to try adjustments.   Performed gait with SPC and demonstration instruction. Pt needing min A due to minor LOB in busy environment. Pt continues to shuffle steps at start of gait cycle, and improve as walk proceeds.   Pt left up in [facility] with all needs in reach, pressed call bell to assist with toileting.      Therapy Documentation Precautions:  Precautions Precautions: Fall Precaution Comments: Pt has difficulty with reading (education not stroke related); will have difficulty with written materials Restrictions Weight Bearing Restrictions: Yes General:   Vital Signs: Therapy Vitals Temp: 98.1 F (36.7 C) Temp src: Oral Pulse Rate: 80 Resp: 17 BP: 145/60 mmHg Patient Position (if appropriate): Sitting Oxygen Therapy SpO2: 97 % Pain: none    See FIM for current functional status  Therapy/Group:  Individual Therapy Nicholas [name], PT, DPT  05/04/2014, 9:59 AM

## 2014-05-04 NOTE — Plan of Care (Signed)
Problem: Consults Goal: Diabetes Guidelines if Diabetic/Glucose > 140 If diabetic or lab glucose is > 140 mg/dl - Initiate Diabetes/Hyperglycemia Guidelines & Document Interventions  Outcome: Progressing Patient family able to verbalize management of diabetes

## 2014-05-04 NOTE — Progress Notes (Signed)
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 Patient lives alone independent with a cane prior to admission. Admitted 04/26/2014 and altered mental status.  MRI with acute nonhemorrhagic left ACA territory infarct as well as remote right parietal lobe infarct. 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 Physical occupational therapy evaluations completed 04/27/2014 recommendations of physical medicine rehabilitation consult.  Subjective/Complaints: Had BM this am + abd pain,- N/V  Review of Systems - unable to obtain secondary to mental status  Objective: Vital Signs: Blood pressure 90/75, pulse 80, temperature 98.1 F (36.7 C), temperature source Oral, resp. rate 17, height 6' 0.05" (1.83 m), weight 106.7 kg (235 lb 3.7 oz), SpO2 97.00%. No results found. Results for orders placed during the hospital encounter of 04/28/14 (from the past 72 hour(s))  GLUCOSE, CAPILLARY     Status: Abnormal   Collection Time    05/01/14  7:41 AM      Result Value Ref Range   Glucose-Capillary 102 (*) 70 - 99 mg/dL  GLUCOSE, CAPILLARY     Status: None   Collection Time    05/01/14 11:58 AM      Result Value Ref Range   Glucose-Capillary 95  70 - 99 mg/dL  GLUCOSE, CAPILLARY     Status: None   Collection Time    05/01/14  5:03 PM      Result Value Ref Range   Glucose-Capillary 84  70 - 99 mg/dL  GLUCOSE, CAPILLARY     Status: Abnormal   Collection Time    05/01/14  8:28 PM      Result Value Ref Range   Glucose-Capillary 149 (*) 70 - 99 mg/dL  GLUCOSE, CAPILLARY     Status: Abnormal   Collection Time    05/02/14  7:55 AM       Result Value Ref Range   Glucose-Capillary 106 (*) 70 - 99 mg/dL  GLUCOSE, CAPILLARY     Status: Abnormal   Collection Time    05/02/14 11:32 AM      Result Value Ref Range   Glucose-Capillary 132 (*) 70 - 99 mg/dL  GLUCOSE, CAPILLARY     Status: None   Collection Time    05/02/14  5:07 PM      Result Value Ref Range   Glucose-Capillary 81  70 - 99 mg/dL  GLUCOSE, CAPILLARY     Status: Abnormal   Collection Time    05/02/14  9:17 PM      Result Value Ref Range   Glucose-Capillary 116 (*) 70 - 99 mg/dL  GLUCOSE, CAPILLARY     Status: Abnormal   Collection Time    05/03/14  7:14 AM      Result Value Ref Range   Glucose-Capillary 113 (*) 70 - 99 mg/dL   Comment 1 Notify RN    GLUCOSE, CAPILLARY     Status: None   Collection Time    05/03/14 11:29 AM      Result Value Ref Range   Glucose-Capillary 94  70 - 99 mg/dL   Comment 1 Notify RN    GLUCOSE, CAPILLARY     Status: None   Collection Time    05/03/14  4:25 PM  Result Value Ref Range   Glucose-Capillary 76  70 - 99 mg/dL  GLUCOSE, CAPILLARY     Status: Abnormal   Collection Time    05/03/14  9:06 PM      Result Value Ref Range   Glucose-Capillary 235 (*) 70 - 99 mg/dL      Head: Normocephalic.  Eyes:  Pupils reactive to light  Neck: Normal range of motion. Neck supple. No thyromegaly present.  Cardiovascular: Normal rate and regular rhythm.  Respiratory: Effort normal and breath sounds normal. No respiratory distress.  GI: Soft. Bowel sounds are normal. He exhibits no distension.  Neurological:  Flat affect.Oriented to person,place,age and DOB.Follows simple commands .Fair awareness of deficits  Skin: Skin is warm and dry.  Scar right side of neck  motor strength is 5/5 bilateral deltoid, bicep, tricep, grip  Minimal dysmetria bilaterally finger-nose-finger  Right lower extremity 4/5 hip flexor knee extensor ankle dorsi flexion plantar flexor  Left extremity 4/5 in the left hip flexor knee extensor 3  minus ankle dorsiflexor plantar flexor   Patient is not oriented to Month day or date   Assessment/Plan: 1. Functional deficits secondary to embolic left ACA infarct which require 3+ hours per day of interdisciplinary therapy in a comprehensive inpatient rehab setting. Physiatrist is providing close team supervision and 24 hour management of active medical problems listed below. Physiatrist and rehab team continue to assess barriers to discharge/monitor patient progress toward functional and medical goals. FIM: FIM - Bathing Bathing Steps Patient Completed: Chest;Right Arm;Left Arm;Abdomen;Right upper leg;Left upper leg;Front perineal area;Buttocks;Right lower leg (including foot);Left lower leg (including foot) Bathing: 5: Supervision: Safety issues/verbal cues  FIM - Upper Body Dressing/Undressing Upper body dressing/undressing steps patient completed: Thread/unthread left sleeve of pullover shirt/dress;Put head through opening of pull over shirt/dress;Thread/unthread right sleeve of pullover shirt/dresss;Pull shirt over trunk Upper body dressing/undressing: 5: Set-up assist to: Obtain clothing/put away FIM - Lower Body Dressing/Undressing Lower body dressing/undressing steps patient completed: Thread/unthread right pants leg;Thread/unthread left pants leg;Don/Doff right shoe;Don/Doff left shoe;Pull pants up/down;Fasten/unfasten pants;Don/Doff right sock;Don/Doff left sock;Thread/unthread right underwear leg;Thread/unthread left underwear leg;Pull underwear up/down Lower body dressing/undressing: 5: Supervision: Safety issues/verbal cues  FIM - Toileting Toileting steps completed by patient: Performs perineal hygiene;Adjust clothing prior to toileting;Adjust clothing after toileting Toileting: 5: Supervision: Safety issues/verbal cues  FIM - Diplomatic Services operational officerToilet Transfers Toilet Transfers Assistive Devices: Grab bars Toilet Transfers: 4-To toilet/BSC: Min A (steadying Pt. > 75%);4-From toilet/BSC: Min  A (steadying Pt. > 75%)  FIM - BankerBed/Chair Transfer Bed/Chair Transfer Assistive Devices: Walker;Arm rests Bed/Chair Transfer: 5: Supine > Sit: Supervision (verbal cues/safety issues);4: Sit > Supine: Min A (steadying pt. > 75%/lift 1 leg);4: Bed > Chair or W/C: Min A (steadying Pt. > 75%);3: Chair or W/C > Bed: Mod A (lift or lower assist)  FIM - Locomotion: Wheelchair Distance: 150 Locomotion: Wheelchair: 0: Activity did not occur FIM - Locomotion: Ambulation Locomotion: Ambulation Assistive Devices: Designer, industrial/productWalker - Rolling Ambulation/Gait Assistance: 4: Min assist Locomotion: Ambulation: 0: Activity did not occur  Comprehension Comprehension Mode: Auditory Comprehension: 5-Understands basic 90% of the time/requires cueing < 10% of the time  Expression Expression Mode: Verbal Expression: 5-Expresses basic needs/ideas: With no assist  Social Interaction Social Interaction: 4-Interacts appropriately 75 - 89% of the time - Needs redirection for appropriate language or to initiate interaction.  Problem Solving Problem Solving: 3-Solves basic 50 - 74% of the time/requires cueing 25 - 49% of the time  Memory Memory: 2-Recognizes or recalls 25 - 49% of the time/requires  cueing 51 - 75% of the time Medical Problem List and Plan:  1. Functional deficits secondary to left ACA distribution infarct suspect embolic secondary to unclear source  2. DVT Prophylaxis/Anticoagulation: Subcutaneous Lovenox. Monitor platelet counts and any signs of bleeding  3. Pain Management: Tylenol as needed  4. Hypertension. Cardizem 360 mg daily, Cardura 2 mg daily, Lopressor 50 mg twice a day. Monitor with increased mobility BP on low side this am write parameters 5. Neuropsych: This patient is not yet capable of making decisions on his own behalf.  6. Diabetes mellitus with peripheral neuropathy. Hemoglobin A1c 5.7. Glipizide 5 mg daily. Check blood sugars a.c. and at bedtime. Provide diabetic teaching.  7.  Hyperlipidemia. Lipitor  8. CAD with stenting. Cardiac enzymes negative. Continue aspirin Plavix therapy. No chest pain or shortness of breath.  9. History of BPH. Check PVRs x3 10.  Left foot drop- pt gives hx of back problem causing Left foot weakness but also has had prior Right parietal CVA,  PT note states AFO made Ext rotation and leg whip worse 11. Abd pain c/o , exam negative, pt having BMs, he feels it was something he ate, monitor, prn maalox  LOS (Days) 6 A FACE TO FACE EVALUATION WAS PERFORMED  Erick Colace 05/04/2014, 7:22 AM

## 2014-05-04 NOTE — Progress Notes (Signed)
Occupational Therapy Session Note  Patient Details  Name: Nicholas [name] MRN: [id number] Date of Birth: Jun 04, 1951  Today's Date: 05/04/2014 Time: 0805-0900 Time Calculation (min): 55 min  Short Term Goals: Week 1:  OT Short Term Goal 1 (Week 1): Pt will transfer to toilet from w/c with mod A x1 using grab bars. OT Short Term Goal 2 (Week 1): Pt will don a shirt with verbal cues only. OT Short Term Goal 3 (Week 1): Pt will don pants over feet with min A and pull over hips with mod A. OT Short Term Goal 4 (Week 1): Pt will stand from EOB and stand with RW with min A. OT Short Term Goal 5 (Week 1): Pt will bathe with min A.     Skilled Therapeutic Interventions/Progress Updates:      Pt seen for BADL retraining of  bathing and dressing with a focus on functional mobility and coordination. Pt ambulated with RW to shower and then back to the w/c in his room with min A. Pt is demonstrating increased balance and coordinated movement of his LE. He is now bathing and dressing with close supervision.  Once self care was completed, pt worked on UE AROM/ Coordination exercises and chair pushups. Pt participated well.  Pt resting in w/c with call light in reach. Pt does not have QRB on. Reviewed with him safety precautions to call for help when he needs to use the bathroom.  Pt expressed understanding.  Therapy Documentation Precautions:  Precautions Precautions: Fall Precaution Comments: Pt has difficulty with reading (education not stroke related); will have difficulty with written materials Restrictions Weight Bearing Restrictions: Yes    Vital Signs: Therapy Vitals BP: 145/60 mmHg Patient Position (if appropriate): Sitting Pain: Pain Assessment Pain Assessment: No/denies pain ADL: ADL ADL Comments: Refer to FIM  See FIM for current functional status  Therapy/Group: Individual Therapy  Earle Gell 05/04/2014, 10:51 AM

## 2014-05-05 ENCOUNTER — Inpatient Hospital Stay (HOSPITAL_COMMUNITY): Payer: Medicare Other | Admitting: Speech Pathology

## 2014-05-05 ENCOUNTER — Inpatient Hospital Stay (HOSPITAL_COMMUNITY): Payer: Medicare Other | Admitting: Occupational Therapy

## 2014-05-05 ENCOUNTER — Inpatient Hospital Stay (HOSPITAL_COMMUNITY): Payer: Medicare Other

## 2014-05-05 LAB — CREATININE, SERUM
Creatinine, Ser: 1.09 mg/dL (ref 0.50–1.35)
GFR calc Af Amer: 82 mL/min — ABNORMAL LOW (ref >90)
GFR calc non Af Amer: 71 mL/min — ABNORMAL LOW (ref >90)

## 2014-05-05 LAB — GLUCOSE, CAPILLARY
Glucose-Capillary: 104 mg/dL — ABNORMAL HIGH (ref 70–99)
Glucose-Capillary: 97 mg/dL (ref 70–99)
Glucose-Capillary: 69 mg/dL — ABNORMAL LOW (ref 70–99)
Glucose-Capillary: 128 mg/dL — ABNORMAL HIGH (ref 70–99)
Glucose-Capillary: 250 mg/dL — ABNORMAL HIGH (ref 70–99)

## 2014-05-05 MED ORDER — METHYLPHENIDATE HCL 5 MG PO TABS
5.0000 mg | ORAL_TABLET | Freq: Two times a day (BID) | ORAL | Status: DC
  Administered 2014-05-05 – 2014-05-12 (×14): 5 mg via ORAL
  Filled 2014-05-05 (×14): qty 1

## 2014-05-05 NOTE — Progress Notes (Signed)
Social Work Lucy [name], LCSW Social Worker Signed  Patient Care Conference Service date: 05/05/2014 2:01 PM  Inpatient RehabilitationTeam Conference and Plan of Care Update Date: 05/05/2014   Time: 11;10 AM     Patient Name: Nicholas [name]       Medical Record Number: [id number]   Date of Birth: May 10, 1951 Sex: Male         Room/Bed: 4W21C/4W21C-01 Payor Info: Payor: MEDICARE / Plan: MEDICARE PART A AND B / Product Type: *No Product type* /   Admitting Diagnosis: L ACA INFARCT   Admit Date/Time:  04/28/2014  5:07 PM Admission Comments: No comment available   Primary Diagnosis:  <principal problem not specified> Principal Problem: <principal problem not specified>    Patient Active Problem List     Diagnosis  Date Noted   .  CVA (cerebral infarction)  04/28/2014   .  Stroke  04/26/2014   .  Acute encephalopathy  04/26/2014   .  PSVT  09/15/2010   .  AORTIC INSUFFICIENCY  08/09/2010   .  CLAUDICATION  08/09/2010   .  PALPITATIONS  08/09/2010   .  HYPERLIPIDEMIA-MIXED  08/08/2010   .  TOBACCO ABUSE  08/08/2010   .  HYPERTENSION, UNSPECIFIED  08/08/2010   .  CAD, NATIVE VESSEL  08/08/2010   .  CAROTID ARTERY DISEASE  08/08/2010     Expected Discharge Date: Expected Discharge Date: 05/19/14  Team Members Present: Physician leading conference: Dr. Claudette Laws Social Worker Present: Dossie Der, LCSW Nurse Present: Carlean Purl, RN PT Present: Edman Circle, PT;Caroline Adriana Simas, PT;Blair Hobble, PT OT Present: Bretta Bang, OT SLP Present: Fae Pippin, SLP PPS Coordinator present : Edson Snowball, Chapman Fitch, RN, CRRN        Current Status/Progress  Goal  Weekly Team Focus   Medical     mental status  improve initiation  consider medications   Bowel/Bladder     incontinent of bowel and bladder  timed toileting q three hr  continence of bowel and bladder; monitor q shift   Swallow/Nutrition/ Hydration     regular, thin liquids with intermittent supervision    modified independent for least restrictive diet   diet tolerance    ADL's     supervision with bathing, dressing, toileting; min-mod A with RW transfers in bathroom  supervision with basic self care, min A with light meal prep and light house keeping  ADLS, dynamic balance, activity tolerance, pt/family education   Mobility     Mod A transfers, S bed mobility, Min A gait with RW x100,' min A 5 stairs wtih 2 rails,  S WC propulsion x150'  S transfers, Mod I bed mobitliy, S gait x150' with LRAD (use cane PTA)   NMR esp for L-side (old CVA), standing balance, cognition and safety awareness, transfer training, gait training and stairs   Communication     min-mod assist to initiate functional communication   supervision  cognition    Safety/Cognition/ Behavioral Observations    mod assist for initiation, mod-max for memory, problem solving, sustained attention    min assist-supervision   education for compensatory strategies    Pain     N/A  N/A  monitor q shift   Skin     buttocks reddened and excoriated related to incontinence  skin to be free of breakdown and reddness with timed toileting success  monitor skin breakdown q shift     *See Care Plan and progress notes for long and short-term  goals.    Barriers to Discharge:  see above      Possible Resolutions to Barriers:    see above      Discharge Planning/Teaching Needs:    Home to daughter's home with son in-law and daughter providing care-24 hr.  Pt is making good progress      Team Discussion:    Making progress, needs timed tolieting to assist with continence.  Trial of ritalin, iniatation issues and deficits from first CVA limit him.   Revisions to Treatment Plan:    None    Continued Need for Acute Rehabilitation Level of Care: The patient requires daily medical management by a physician with specialized training in physical medicine and rehabilitation for the following conditions: Daily direction of a multidisciplinary  physical rehabilitation program to ensure safe treatment while eliciting the highest outcome that is of practical value to the patient.: Yes Daily analysis of laboratory values and/or radiology reports with any subsequent need for medication adjustment of medical intervention for : Neurological problems  Lucy ChrisRebecca G Solymar Grace 05/05/2014, 3:33 PM          Patient ID: Erin FullingWillie J Ice, male   DOB: 03/09/1951, 63 y.o.   MRN: [id number]

## 2014-05-05 NOTE — Progress Notes (Signed)
Social Work Patient ID: Nicholas [name], male   DOB: [birth year], 63 y.o.   MRN: [id number] Met with pt to inform of team conference goals -supervision level and discharge date 6/10.  Discussed working on his continence and how this will help his daughter and son-in-law at home. He can tell he has made some progress but wants to to more for himself.  Will contact daughter to discuss goals and see if any questions and work on a safe discharge plan. Have left a message and will await return call.

## 2014-05-05 NOTE — Progress Notes (Signed)
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 Patient lives alone independent with a cane prior to admission. Admitted 04/26/2014 and altered mental status.  MRI with acute nonhemorrhagic left ACA territory infarct as well as remote right parietal lobe infarct. 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  Subjective/Complaints: Eating breakfast, no further abd pain  Review of Systems - unable to obtain secondary to mental status  Objective: Vital Signs: Blood pressure 144/76, pulse 82, temperature 98 F (36.7 C), temperature source Oral, resp. rate 18, height 6' 0.05" (1.83 m), weight 106.7 kg (235 lb 3.7 oz), SpO2 93.00%. No results found. Results for orders placed during the hospital encounter of 04/28/14 (from the past 72 hour(s))  GLUCOSE, CAPILLARY     Status: Abnormal   Collection Time    05/02/14 11:32 AM      Result Value Ref Range   Glucose-Capillary 132 (*) 70 - 99 mg/dL  GLUCOSE, CAPILLARY     Status: None   Collection Time    05/02/14  5:07 PM      Result Value Ref Range   Glucose-Capillary 81  70 - 99 mg/dL  GLUCOSE, CAPILLARY     Status: Abnormal   Collection Time    05/02/14  9:17 PM      Result Value Ref Range   Glucose-Capillary 116 (*) 70 - 99 mg/dL  GLUCOSE, CAPILLARY     Status: Abnormal   Collection Time    05/03/14  7:14 AM      Result Value Ref Range   Glucose-Capillary 113 (*) 70 - 99 mg/dL   Comment 1 Notify RN    GLUCOSE, CAPILLARY     Status: None   Collection Time    05/03/14 11:29 AM      Result Value Ref Range   Glucose-Capillary 94  70 - 99 mg/dL   Comment 1 Notify RN     GLUCOSE, CAPILLARY     Status: None   Collection Time    05/03/14  4:25 PM      Result Value Ref Range   Glucose-Capillary 76  70 - 99 mg/dL  GLUCOSE, CAPILLARY     Status: Abnormal   Collection Time    05/03/14  9:06 PM      Result Value Ref Range   Glucose-Capillary 235 (*) 70 - 99 mg/dL  GLUCOSE, CAPILLARY     Status: None   Collection Time    05/04/14  7:28 AM      Result Value Ref Range   Glucose-Capillary 92  70 - 99 mg/dL   Comment 1 Notify RN    GLUCOSE, CAPILLARY     Status: Abnormal   Collection Time    05/04/14 11:44 AM      Result Value Ref Range   Glucose-Capillary 100 (*) 70 - 99 mg/dL   Comment 1 Notify RN    GLUCOSE, CAPILLARY     Status: None   Collection Time    05/04/14  4:28 PM      Result Value Ref Range   Glucose-Capillary 79  70 - 99 mg/dL   Comment 1 Notify RN    GLUCOSE, CAPILLARY     Status: None   Collection  Time    05/04/14  9:13 PM      Result Value Ref Range   Glucose-Capillary 99  70 - 99 mg/dL  CREATININE, SERUM     Status: Abnormal   Collection Time    05/05/14  5:00 AM      Result Value Ref Range   Creatinine, Ser 1.09  0.50 - 1.35 mg/dL   GFR calc non Af Amer 71 (*) >90 mL/min   GFR calc Af Amer 82 (*) >90 mL/min   Comment: (NOTE)     The eGFR has been calculated using the CKD EPI equation.     This calculation has not been validated in all clinical situations.     eGFR's persistently <90 mL/min signify possible Chronic Kidney     Disease.  GLUCOSE, CAPILLARY     Status: Abnormal   Collection Time    05/05/14  7:24 AM      Result Value Ref Range   Glucose-Capillary 104 (*) 70 - 99 mg/dL   Comment 1 Notify RN        Head: Normocephalic.  Eyes:  Pupils reactive to light  Neck: Normal range of motion. Neck supple. No thyromegaly present.  Cardiovascular: Normal rate and regular rhythm.  Respiratory: Effort normal and breath sounds normal. No respiratory distress.  GI: Soft. Bowel sounds are normal. He exhibits no  distension.  Neurological:  Flat affect. Marice Potter awareness of deficits  Skin: Skin is warm and dry.  Scar right side of neck  motor strength is 5/5 bilateral deltoid, bicep, tricep, grip  Minimal dysmetria bilaterally finger-nose-finger  Right lower extremity 4/5 hip flexor knee extensor ankle dorsi flexion plantar flexor  Left extremity 4/5 in the left hip flexor knee extensor 3 minus ankle dorsiflexor plantar flexor   Patient is not oriented to Month day or date   Assessment/Plan: 1. Functional deficits secondary to embolic left ACA infarct which require 3+ hours per day of interdisciplinary therapy in a comprehensive inpatient rehab setting. Physiatrist is providing close team supervision and 24 hour management of active medical problems listed below. Physiatrist and rehab team continue to assess barriers to discharge/monitor patient progress toward functional and medical goals. Team conference today please see physician documentation under team conference tab, met with team face-to-face to discuss problems,progress, and goals. Formulized individual treatment plan based on medical history, underlying problem and comorbidities. FIM: FIM - Bathing Bathing Steps Patient Completed: Chest;Right Arm;Left Arm;Abdomen;Right upper leg;Left upper leg;Front perineal area;Buttocks;Right lower leg (including foot);Left lower leg (including foot) Bathing: 5: Supervision: Safety issues/verbal cues  FIM - Upper Body Dressing/Undressing Upper body dressing/undressing steps patient completed: Thread/unthread left sleeve of pullover shirt/dress;Put head through opening of pull over shirt/dress;Thread/unthread right sleeve of pullover shirt/dresss;Pull shirt over trunk Upper body dressing/undressing: 5: Set-up assist to: Obtain clothing/put away FIM - Lower Body Dressing/Undressing Lower body dressing/undressing steps patient completed: Thread/unthread right pants leg;Thread/unthread left pants leg;Don/Doff  right shoe;Don/Doff left shoe;Pull pants up/down;Fasten/unfasten pants;Don/Doff right sock;Don/Doff left sock;Thread/unthread right underwear leg;Thread/unthread left underwear leg;Pull underwear up/down Lower body dressing/undressing: 5: Supervision: Safety issues/verbal cues  FIM - Toileting Toileting steps completed by patient: Performs perineal hygiene;Adjust clothing prior to toileting;Adjust clothing after toileting Toileting: 5: Supervision: Safety issues/verbal cues  FIM - Radio producer Devices: Grab bars Toilet Transfers: 4-To toilet/BSC: Min A (steadying Pt. > 75%);4-From toilet/BSC: Min A (steadying Pt. > 75%)  FIM - Bed/Chair Transfer Bed/Chair Transfer Assistive Devices: Arm rests;Cane Bed/Chair Transfer: 5: Supine > Sit: Supervision (verbal cues/safety  issues)  FIM - Locomotion: Wheelchair Distance: 150 Locomotion: Wheelchair: 1: Total Assistance/staff pushes wheelchair (Pt<25%) FIM - Locomotion: Ambulation Locomotion: Ambulation Assistive Devices: Journalist, newspaper Ambulation/Gait Assistance: 4: Min assist Locomotion: Ambulation: 2: Travels 50 - 149 ft with minimal assistance (Pt.>75%)  Comprehension Comprehension Mode: Auditory Comprehension: 5-Understands basic 90% of the time/requires cueing < 10% of the time  Expression Expression Mode: Verbal Expression: 5-Expresses basic needs/ideas: With no assist  Social Interaction Social Interaction: 4-Interacts appropriately 75 - 89% of the time - Needs redirection for appropriate language or to initiate interaction.  Problem Solving Problem Solving: 3-Solves basic 50 - 74% of the time/requires cueing 25 - 49% of the time  Memory Memory: 2-Recognizes or recalls 25 - 49% of the time/requires cueing 51 - 75% of the time Medical Problem List and Plan:  1. Functional deficits secondary to left ACA distribution infarct suspect embolic secondary to unclear source  2. DVT  Prophylaxis/Anticoagulation: Subcutaneous Lovenox. Monitor platelet counts and any signs of bleeding  3. Pain Management: Tylenol as needed  4. Hypertension. Cardizem 360 mg daily, Cardura 2 mg daily, Lopressor 50 mg twice a day. Monitor with increased mobility BP on low side this am write parameters 5. Neuropsych: This patient is not yet capable of making decisions on his own behalf.  6. Diabetes mellitus with peripheral neuropathy. Hemoglobin A1c 5.7. Glipizide 5 mg daily. Check blood sugars a.c. and at bedtime. Provide diabetic teaching.  7. Hyperlipidemia. Lipitor  8. CAD with stenting. Cardiac enzymes negative. Continue aspirin Plavix therapy. No chest pain or shortness of breath.  9. History of BPH. Check PVRs x3 10.  Left foot drop- pt gives hx of back problem causing Left foot weakness but also has had prior Right parietal CVA,  PT note states AFO made Ext rotation and leg whip worse 11. Abd pain   LOS (Days) 7 A FACE TO FACE EVALUATION WAS PERFORMED  Charlett Blake 05/05/2014, 7:55 AM

## 2014-05-05 NOTE — Patient Care Conference (Signed)
Inpatient RehabilitationTeam Conference and Plan of Care Update Date: 05/05/2014   Time: 11;10 AM    Patient Name: Nicholas [name]      Medical Record Number: [id number]  Date of Birth: 12/30/1950 Sex: Male         Room/Bed: 4W21C/4W21C-01 Payor Info: Payor: MEDICARE / Plan: MEDICARE PART A AND B / Product Type: *No Product type* /    Admitting Diagnosis: L ACA INFARCT  Admit Date/Time:  04/28/2014  5:07 PM Admission Comments: No comment available   Primary Diagnosis:  <principal problem not specified> Principal Problem: <principal problem not specified>  Patient Active Problem List   Diagnosis Date Noted  . CVA (cerebral infarction) 04/28/2014  . Stroke 04/26/2014  . Acute encephalopathy 04/26/2014  . PSVT 09/15/2010  . AORTIC INSUFFICIENCY 08/09/2010  . CLAUDICATION 08/09/2010  . PALPITATIONS 08/09/2010  . HYPERLIPIDEMIA-MIXED 08/08/2010  . TOBACCO ABUSE 08/08/2010  . HYPERTENSION, UNSPECIFIED 08/08/2010  . CAD, NATIVE VESSEL 08/08/2010  . CAROTID ARTERY DISEASE 08/08/2010    Expected Discharge Date: Expected Discharge Date: 05/19/14  Team Members Present: Physician leading conference: Dr. Claudette LawsAndrew Kirsteins Social Worker Present: Dossie DerBecky Jashan Cotten, LCSW Nurse Present: Carlean PurlMaryann Barbour, RN PT Present: Edman CircleAudra Hall, PT;Caroline Adriana [place], PT;Blair Hobble, PT OT Present: Bretta BangKris Gellert, OT SLP Present: Fae PippinMelissa Bowie, SLP PPS Coordinator present : Edson SnowballBecky Windsor, Chapman FitchPT;Marie Noel, RN, CRRN     Current Status/Progress Goal Weekly Team Focus  Medical   mental status  improve initiation  consider medications   Bowel/Bladder   incontinent of bowel and bladder  timed toileting q three hr  continence of bowel and bladder; monitor q shift   Swallow/Nutrition/ Hydration   regular, thin liquids with intermittent supervision   modified independent for least restrictive diet   diet tolerance    ADL's   supervision with bathing, dressing, toileting; min-mod A with RW transfers in bathroom   supervision with basic self care, min A with light meal prep and light house keeping  ADLS, dynamic balance, activity tolerance, pt/family education   Mobility   Mod A transfers, S bed mobility, Min A gait with RW x100,' min A 5 stairs wtih 2 rails,  S WC propulsion x150'  S transfers, Mod I bed mobitliy, S gait x150' with LRAD (use cane PTA)   NMR esp for L-side (old CVA), standing balance, cognition and safety awareness, transfer training, gait training and stairs   Communication   min-mod assist to initiate functional communication   supervision  cognition    Safety/Cognition/ Behavioral Observations  mod assist for initiation, mod-max for memory, problem solving, sustained attention    min assist-supervision   education for compensatory strategies    Pain   N/A  N/A  monitor q shift   Skin   buttocks reddened and excoriated related to incontinence  skin to be free of breakdown and reddness with timed toileting success  monitor skin breakdown q shift      *See Care Plan and progress notes for long and short-term goals.  Barriers to Discharge: see above    Possible Resolutions to Barriers:  see above    Discharge Planning/Teaching Needs:  Home to daughter's home with son in-law and daughter providing care-24 hr.  Pt is making good progress      Team Discussion:  Making progress, needs timed tolieting to assist with continence.  Trial of ritalin, iniatation issues and deficits from first CVA limit him.  Revisions to Treatment Plan:  None   Continued Need for Acute Rehabilitation Level  of Care: The patient requires daily medical management by a physician with specialized training in physical medicine and rehabilitation for the following conditions: Daily direction of a multidisciplinary physical rehabilitation program to ensure safe treatment while eliciting the highest outcome that is of practical value to the patient.: Yes Daily analysis of laboratory values and/or radiology  reports with any subsequent need for medication adjustment of medical intervention for : Neurological problems  Lucy [name] 05/05/2014, 3:33 PM

## 2014-05-05 NOTE — Progress Notes (Signed)
Speech Language Pathology Daily Session Note  Patient Details  Name: Nicholas [name] MRN: [id number] Date of Birth: 06-20-1951  Today's Date: 05/05/2014 Time: 7116-5790 Time Calculation (min): 40 min  Short Term Goals: Week 1: SLP Short Term Goal 1 (Week 1): Patient will demonstrate basic problem solving during self-care tasks with Min verbal cues SLP Short Term Goal 2 (Week 1): Patient will sustain attention to basic self-care tasks for 5 minutes with Mod cues SLP Short Term Goal 3 (Week 1): Patient will label 3 deficits with Mod question cues SLP Short Term Goal 4 (Week 1): Patient will tolerate regula textures and thin liquids with Mod I  SLP Short Term Goal 5 (Week 1): Patient will follow multi-step commands during functional tasks with Min vebral cues  Skilled Therapeutic Interventions: Pt was seen for skilled speech therapy targeting memory, safety awareness/insight, and executive function.  Pt propelled himself to the speech therapy treatment room in his wheelchair with supervision for functional wayfinding.  Pt recalled 1 detail from yesterday's therapy session with mod assist.  During a structured categorization task, pt was 87% independent for organization and sorting, improving to 100% accuracy with min assist.  In addition, pt was able to immediately recall 3/3 functional items from the aforementioned sorting task independently; 1/3 independent delayed recall, improving to 2/3 with min assist and 3/3 with mod assist.  Pt benefitted from min-mod cuing for safety awareness and mental flexibility during a structured verbal reasoning task.  Continue per current plan of care.   FIM:  Comprehension Comprehension Mode: Auditory Comprehension: 5-Understands basic 90% of the time/requires cueing < 10% of the time Expression Expression Mode: Verbal Expression: 5-Expresses basic 90% of the time/requires cueing < 10% of the time. Social Interaction Social Interaction: 4-Interacts  appropriately 75 - 89% of the time - Needs redirection for appropriate language or to initiate interaction. Problem Solving Problem Solving: 3-Solves basic 50 - 74% of the time/requires cueing 25 - 49% of the time Memory Memory: 2-Recognizes or recalls 25 - 49% of the time/requires cueing 51 - 75% of the time  Pain Pain Assessment Pain Assessment: No/denies pain  Therapy/Group: Individual Therapy  Jackalyn Lombard, M.A. CCC-SLP  Nicholas [name] 05/05/2014, 4:40 PM

## 2014-05-05 NOTE — Progress Notes (Signed)
Occupational Therapy Session Note  Patient Details  Name: Nicholas [name] MRN: [id number] Date of Birth: 01/12/1951  Today's Date: 05/05/2014 Time: 1000-1100 Time Calculation (min): 60 min  Short Term Goals: Week 1:  OT Short Term Goal 1 (Week 1): Pt will transfer to toilet from w/c with mod A x1 using grab bars. OT Short Term Goal 2 (Week 1): Pt will don a shirt with verbal cues only. OT Short Term Goal 3 (Week 1): Pt will don pants over feet with min A and pull over hips with mod A. OT Short Term Goal 4 (Week 1): Pt will stand from EOB and stand with RW with min A. OT Short Term Goal 5 (Week 1): Pt will bathe with min A.      Skilled Therapeutic Interventions/Progress Updates:      Pt seen for BADL retraining of bathing at shower level and dressing with a focus on dynamic balance, functional transfers with RW, and problem solving.  Discussed with pt the need to pay attention to his bladder fullness and call for help to use the toilet. Pt continues to have issues with incontinency.  Overall, pt was supervision with his self care and min A to ambulate to the bathroom with RW.  Pt only needed one VC to adequately bathe himself. Pt resting in wc at end of session with call light and phone in reach.  Therapy Documentation Precautions:  Precautions Precautions: Fall Precaution Comments: Pt has difficulty with reading (education not stroke related); will have difficulty with written materials Restrictions Weight Bearing Restrictions: Yes      Pain: Pain Assessment Pain Assessment: No/denies pain ADL: ADL ADL Comments: Refer to FIM  See FIM for current functional status  Therapy/Group: Individual Therapy  Stark Bray Ayman Brull 05/05/2014, 1:03 PM

## 2014-05-05 NOTE — Progress Notes (Addendum)
Physical Therapy Weekly Progress Note  Patient Details  Name: Nicholas [name] MRN: [id number] Date of Birth: 08/15/1951  Beginning of progress report period: Apr 28, 2014 End of progress report period: May 05, 2014  Today's Date: 05/05/2014 Time:0800-0900,  9021-1155 Time Calculation (min): 60 min, 45 min  Patient has met 4 of 4 short term goals.    Patient continues to demonstrate the following deficits: motor control L and RLEs, balance, coordination L and RLEs, cognition, functional mobility and locomotion and therefore will continue to benefit from skilled PT intervention to enhance overall performance with balance, postural control, ability to compensate for deficits, functional use of  right lower extremity and left lower extremity, awareness and coordination.  Patient progressing toward long term goals..  Continue plan of care.  PT Short Term Goals Week 1:  PT Short Term Goal 1 (Week 1): pt will perform basic transfer with assistance of 1 person PT Short Term Goal 1 - Progress (Week 1): Met PT Short Term Goal 2 (Week 1): pt will propel w/c 50' in home setting with supervision PT Short Term Goal 2 - Progress (Week 1): Met PT Short Term Goal 3 (Week 1): pt will move sit> stand with mod assist PT Short Term Goal 3 - Progress (Week 1): Met PT Short Term Goal 4 (Week 1): pt will perform gait x 15' with assistance of 2 people PT Short Term Goal 4 - Progress (Week 1): Met  Skilled Therapeutic Interventions/Progress Updates:  Tx 1:  neuromuscular re-education via forced use, VCs, manual cues for RLE, LLE and RUE motor control during w/c propulsion using bil UEs, gait with RW and ACE LLE for foot drop, bil hand task of placing Rx bottle lids on bottles in sitting, standing calf raises.  Pt does not demonstrate any active L PF in sitting or standing.  Self stretching L hamstrings and heel cord in sitting to improved L foot clearance and decrease shuffling.  Gait with bariatric RW  (for greater width) , ACE on L LE x 40' x 2, min assist; mod assist on turns due to L foot bumping Rw and pt leaving it outside legs of RW, with poor awareness of risk.  Tx 2:  Self stretching L hamstrings and heel cords in sitting, 4# weight on thigh, with assistance.  W/c propulsion using bil UEs to propel with supervision for steering, x 150'.  Trial of L Toe Off AFO for foot drop control during neuromuscular re-education in standing with support at L hip, gait x 12' forward and backward, with support of bil UEs forward for active assistive R and L knee flexion;  side stepping R and L focusing on hip alignment.  Therapeutic activities of kicking ball L or R foot in standing with L hip support  Pt has habit/compensation of keeping L hip in ER.    Therapy Documentation Precautions:  Precautions Precautions: Fall Precaution Comments: Pt has difficulty with reading (education not stroke related); will have difficulty with written materials Restrictions Weight Bearing Restrictions: Yes    See FIM for current functional status  Therapy/Group: [place] 05/05/2014, 3:36 PM

## 2014-05-06 ENCOUNTER — Inpatient Hospital Stay (HOSPITAL_COMMUNITY): Payer: Medicare Other

## 2014-05-06 ENCOUNTER — Inpatient Hospital Stay (HOSPITAL_COMMUNITY): Payer: Medicare Other | Admitting: Physical Therapy

## 2014-05-06 ENCOUNTER — Encounter (HOSPITAL_COMMUNITY): Payer: Medicare Other

## 2014-05-06 ENCOUNTER — Inpatient Hospital Stay (HOSPITAL_COMMUNITY): Payer: Medicare Other | Admitting: Occupational Therapy

## 2014-05-06 DIAGNOSIS — I2510 Atherosclerotic heart disease of native coronary artery without angina pectoris: Secondary | ICD-10-CM

## 2014-05-06 DIAGNOSIS — I6340 Cerebral infarction due to embolism of unspecified cerebral artery: Secondary | ICD-10-CM

## 2014-05-06 DIAGNOSIS — I10 Essential (primary) hypertension: Secondary | ICD-10-CM

## 2014-05-06 DIAGNOSIS — E1149 Type 2 diabetes mellitus with other diabetic neurological complication: Secondary | ICD-10-CM

## 2014-05-06 DIAGNOSIS — I69919 Unspecified symptoms and signs involving cognitive functions following unspecified cerebrovascular disease: Secondary | ICD-10-CM

## 2014-05-06 LAB — GLUCOSE, CAPILLARY
GLUCOSE-CAPILLARY: 111 mg/dL — AB (ref 70–99)
Glucose-Capillary: 91 mg/dL (ref 70–99)
Glucose-Capillary: 84 mg/dL (ref 70–99)
Glucose-Capillary: 143 mg/dL — ABNORMAL HIGH (ref 70–99)

## 2014-05-06 NOTE — Progress Notes (Signed)
Inpatient Diabetes Program Recommendations  AACE/ADA: New Consensus Statement on Inpatient Glycemic Control (2013)  Target Ranges:  Prepandial:   less than 140 mg/dL      Peak postprandial:   less than 180 mg/dL (1-2 hours)      Critically ill patients:  140 - 180 mg/dL   Reason for Visit: Hypoglycemia  Results for JUD, FABREGAS (MRN [id number]) as of 05/06/2014 14:00  Ref. Range 05/05/2014 16:57 05/05/2014 17:30 05/05/2014 20:32 05/06/2014 07:37 05/06/2014 12:05  Glucose-Capillary Latest Range: 70-99 mg/dL 69 (L) 771 (H) 165 (H) 111 (H) 91    Inpatient Diabetes Program Recommendations Insulin - Meal Coverage: D/C glipizide while inpatient to prevent hypoglycemia  Note: Will follow. Thank you. Ailene Ards, RD, LDN, CDE Inpatient Diabetes Coordinator [phone]

## 2014-05-06 NOTE — Progress Notes (Signed)
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 Patient lives alone independent with a cane prior to admission. Admitted 04/26/2014 and altered mental status.  MRI with acute nonhemorrhagic left ACA territory infarct as well as remote right parietal lobe infarct. 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  Subjective/Complaints: No new issues over nite, discussed D/C plan  Review of Systems - unable to obtain secondary to mental status  Objective: Vital Signs: Blood pressure 145/64, pulse 72, temperature 97.7 F (36.5 C), temperature source Oral, resp. rate 18, height 6' 0.05" (1.83 m), weight 107.684 kg (237 lb 6.4 oz), SpO2 98.00%. No results found. Results for orders placed during the hospital encounter of 04/28/14 (from the past 72 hour(s))  GLUCOSE, CAPILLARY     Status: None   Collection Time    05/03/14 11:29 AM      Result Value Ref Range   Glucose-Capillary 94  70 - 99 mg/dL   Comment 1 Notify RN    GLUCOSE, CAPILLARY     Status: None   Collection Time    05/03/14  4:25 PM      Result Value Ref Range   Glucose-Capillary 76  70 - 99 mg/dL  GLUCOSE, CAPILLARY     Status: Abnormal   Collection Time    05/03/14  9:06 PM      Result Value Ref Range   Glucose-Capillary 235 (*) 70 - 99 mg/dL  GLUCOSE, CAPILLARY     Status: None   Collection Time    05/04/14  7:28 AM      Result Value Ref Range   Glucose-Capillary 92  70 - 99 mg/dL   Comment 1 Notify RN    GLUCOSE, CAPILLARY     Status: Abnormal   Collection Time    05/04/14 11:44 AM      Result Value Ref Range   Glucose-Capillary 100 (*) 70 - 99  mg/dL   Comment 1 Notify RN    GLUCOSE, CAPILLARY     Status: None   Collection Time    05/04/14  4:28 PM      Result Value Ref Range   Glucose-Capillary 79  70 - 99 mg/dL   Comment 1 Notify RN    GLUCOSE, CAPILLARY     Status: None   Collection Time    05/04/14  9:13 PM      Result Value Ref Range   Glucose-Capillary 99  70 - 99 mg/dL  CREATININE, SERUM     Status: Abnormal   Collection Time    05/05/14  5:00 AM      Result Value Ref Range   Creatinine, Ser 1.09  0.50 - 1.35 mg/dL   GFR calc non Af Amer 71 (*) >90 mL/min   GFR calc Af Amer 82 (*) >90 mL/min   Comment: (NOTE)     The eGFR has been calculated using the CKD EPI equation.     This calculation has not been validated in all clinical situations.     eGFR's persistently <90 mL/min signify possible Chronic Kidney     Disease.  GLUCOSE, CAPILLARY     Status: Abnormal   Collection Time    05/05/14  7:24 AM        Result Value Ref Range   Glucose-Capillary 104 (*) 70 - 99 mg/dL   Comment 1 Notify RN    GLUCOSE, CAPILLARY     Status: None   Collection Time    05/05/14 11:45 AM      Result Value Ref Range   Glucose-Capillary 97  70 - 99 mg/dL   Comment 1 Notify RN    GLUCOSE, CAPILLARY     Status: Abnormal   Collection Time    05/05/14  4:57 PM      Result Value Ref Range   Glucose-Capillary 69 (*) 70 - 99 mg/dL   Comment 1 Notify RN    GLUCOSE, CAPILLARY     Status: Abnormal   Collection Time    05/05/14  5:30 PM      Result Value Ref Range   Glucose-Capillary 128 (*) 70 - 99 mg/dL   Comment 1 Notify RN    GLUCOSE, CAPILLARY     Status: Abnormal   Collection Time    05/05/14  8:32 PM      Result Value Ref Range   Glucose-Capillary 250 (*) 70 - 99 mg/dL      Head: Normocephalic.  Eyes:  Pupils reactive to light  Neck: Normal range of motion. Neck supple. No thyromegaly present.  Cardiovascular: Normal rate and regular rhythm.  Respiratory: Effort normal and breath sounds normal. No respiratory distress.   GI: Soft. Bowel sounds are normal. He exhibits no distension.  Neurological:  Flat affect. .Fair awareness of deficits  Skin: Skin is warm and dry.  Scar right side of neck  motor strength is 5/5 bilateral deltoid, bicep, tricep, grip  Minimal dysmetria bilaterally finger-nose-finger  Right lower extremity 4/5 hip flexor knee extensor ankle dorsi flexion plantar flexor  Left extremity 4/5 in the left hip flexor knee extensor 3 minus ankle dorsiflexor plantar flexor   Patient is not oriented to Month day or date   Assessment/Plan: 1. Functional deficits secondary to embolic left ACA infarct which require 3+ hours per day of interdisciplinary therapy in a comprehensive inpatient rehab setting. Physiatrist is providing close team supervision and 24 hour management of active medical problems listed below. Physiatrist and rehab team continue to assess barriers to discharge/monitor patient progress toward functional and medical goals.  FIM: FIM - Bathing Bathing Steps Patient Completed: Chest;Right Arm;Left Arm;Abdomen;Right upper leg;Left upper leg;Front perineal area;Buttocks;Right lower leg (including foot);Left lower leg (including foot) Bathing: 5: Supervision: Safety issues/verbal cues  FIM - Upper Body Dressing/Undressing Upper body dressing/undressing steps patient completed: Thread/unthread left sleeve of pullover shirt/dress;Put head through opening of pull over shirt/dress;Thread/unthread right sleeve of pullover shirt/dresss;Pull shirt over trunk Upper body dressing/undressing: 5: Set-up assist to: Obtain clothing/put away FIM - Lower Body Dressing/Undressing Lower body dressing/undressing steps patient completed: Thread/unthread right pants leg;Thread/unthread left pants leg;Don/Doff right shoe;Don/Doff left shoe;Pull pants up/down;Fasten/unfasten pants;Don/Doff right sock;Don/Doff left sock;Thread/unthread right underwear leg;Thread/unthread left underwear leg;Pull underwear  up/down Lower body dressing/undressing: 5: Supervision: Safety issues/verbal cues  FIM - Toileting Toileting steps completed by patient: Performs perineal hygiene;Adjust clothing prior to toileting;Adjust clothing after toileting Toileting: 5: Supervision: Safety issues/verbal cues  FIM - Toilet Transfers Toilet Transfers Assistive Devices: Grab bars Toilet Transfers: 4-To toilet/BSC: Min A (steadying Pt. > 75%);4-From toilet/BSC: Min A (steadying Pt. > 75%)  FIM - Bed/Chair Transfer Bed/Chair Transfer Assistive Devices: Arm rests;Cane Bed/Chair Transfer: 3: Chair or W/C > Bed: Mod A (lift or lower assist);4: Bed > Chair or W/C: Min A (steadying Pt. > 75%)    FIM - Locomotion: Wheelchair Distance: 150 Locomotion: Wheelchair: 5: Travels 150 ft or more: maneuvers on rugs and over door sills with supervision, cueing or coaxing FIM - Locomotion: Ambulation Locomotion: Ambulation Assistive Devices: Walker - Rolling (bari) Ambulation/Gait Assistance: 3: Mod assist Locomotion: Ambulation: 1: Travels less than 50 ft with moderate assistance (Pt: 50 - 74%)  Comprehension Comprehension Mode: Auditory Comprehension: 5-Understands complex 90% of the time/Cues < 10% of the time  Expression Expression Mode: Verbal Expression: 5-Expresses basic 90% of the time/requires cueing < 10% of the time.  Social Interaction Social Interaction: 4-Interacts appropriately 75 - 89% of the time - Needs redirection for appropriate language or to initiate interaction.  Problem Solving Problem Solving: 3-Solves basic 50 - 74% of the time/requires cueing 25 - 49% of the time  Memory Memory: 2-Recognizes or recalls 25 - 49% of the time/requires cueing 51 - 75% of the time Medical Problem List and Plan:  1. Functional deficits secondary to left ACA distribution infarct suspect embolic secondary to unclear source  2. DVT Prophylaxis/Anticoagulation: Subcutaneous Lovenox. Monitor platelet counts and any signs of  bleeding  3. Pain Management: Tylenol as needed  4. Hypertension. Cardizem 360 mg daily, Cardura 2 mg daily, Lopressor 50 mg twice a day. Monitor with increased mobility BP on low side this am write parameters 5. Neuropsych: This patient is not yet capable of making decisions on his own behalf.  6. Diabetes mellitus with peripheral neuropathy. Hemoglobin A1c 5.7. Glipizide 5 mg daily. Check blood sugars a.c. and at bedtime. Provide diabetic teaching.  7. Hyperlipidemia. Lipitor  8. CAD with stenting. Cardiac enzymes negative. Continue aspirin Plavix therapy. No chest pain or shortness of breath.  9. History of BPH. Check PVRs x3 10.  Left foot drop- pt gives hx of back problem causing Left foot weakness but also has had prior Right parietal CVA,  PT note states AFO made Ext rotation and leg whip worse 11. Abd pain   LOS (Days) 8 A FACE TO FACE EVALUATION WAS PERFORMED   E  05/06/2014, 7:25 AM    

## 2014-05-06 NOTE — Progress Notes (Signed)
Speech Language Pathology Weekly Progress and Session Notes  Patient Details  Name: Nicholas [name] MRN: [id number] Date of Birth: February 11, 1951  Beginning of progress report period: Apr 29, 2014 End of progress report period: May 06, 2014  Today's Date: 05/06/2014 Time: 1530-1600 Time Calculation (min): 30 min  Short Term Goals: Week 1: SLP Short Term Goal 1 (Week 1): Patient will demonstrate basic problem solving during self-care tasks with Min verbal cues SLP Short Term Goal 1 - Progress (Week 1): Not met SLP Short Term Goal 2 (Week 1): Patient will sustain attention to basic self-care tasks for 5 minutes with Mod cues SLP Short Term Goal 2 - Progress (Week 1): Met SLP Short Term Goal 3 (Week 1): Patient will label 3 deficits with Mod question cues SLP Short Term Goal 3 - Progress (Week 1): Not met SLP Short Term Goal 4 (Week 1): Patient will tolerate regula textures and thin liquids with Mod I  SLP Short Term Goal 4 - Progress (Week 1): Met SLP Short Term Goal 5 (Week 1): Patient will follow multi-step commands during functional tasks with Min vebral cues SLP Short Term Goal 5 - Progress (Week 1): Not met    New Short Term Goals: Week 2: SLP Short Term Goal 1 (Week 2): Patient will demonstrate basic problem solving during self-care tasks with Min verbal cues SLP Short Term Goal 2 (Week 2): Patient will sustain attention to basic self-care tasks for 10 minutes with Min cues SLP Short Term Goal 3 (Week 2): Patient will label 3 deficits with Mod question cues SLP Short Term Goal 4 (Week 2): Patient will follow multi-step commands during functional tasks with Min vebral cues  Weekly Progress Updates: Pt has met 2 out of 5 STGs during this admission due to increased sustained attention and utilization of safe swallowing strategies. Pt is consuming regular textures and thin liquids wtih set-up assistance. He requires Mod-Max cues for intellectual awareness, recall of information, and  basic problem solving. Pt will benefit from continued SLP services to maximize functional independence prior to d/c home with family.   Intensity: Minumum of 1-2 x/day, 30 to 90 minutes Frequency: 5 out of 7 days Duration/Length of Stay: 21-24 days Treatment/Interventions: Cognitive remediation/compensation;Cueing hierarchy;Environmental controls;Functional tasks;Internal/external aids;Patient/family education;Speech/Language facilitation;Therapeutic Activities   Daily Session Skilled Therapeutic Interventions: Skilled treatment focused on cognitive goals. SLP facilitated session with new learning task. Pt initially required Max cues for problem solving and working memory to recall instructions with utilization of external memory aid. Pt with increased abilities as time progressed, with cueing faded to a Moderate level with no external aids needed. Pt required Max cues for intellectual and emergent awareness of difficulty with task.        FIM:  Comprehension Comprehension Mode: Auditory Comprehension: 5-Follows basic conversation/direction: With extra time/assistive device Expression Expression Mode: Verbal Expression: 5-Expresses basic needs/ideas: With extra time/assistive device Social Interaction Social Interaction: 4-Interacts appropriately 75 - 89% of the time - Needs redirection for appropriate language or to initiate interaction. Problem Solving Problem Solving: 3-Solves basic 50 - 74% of the time/requires cueing 25 - 49% of the time Memory Memory: 2-Recognizes or recalls 25 - 49% of the time/requires cueing 51 - 75% of the time FIM - Eating Eating Activity: 5: Set-up assist for open containers General    Pain  No/denies pain  Therapy/Group: Individual Therapy   Germain Osgood, M.A. CCC-SLP [phone]  Germain Osgood 05/06/2014, 5:52 PM

## 2014-05-06 NOTE — Progress Notes (Signed)
Nutrition Brief Note  Patient identified on the Malnutrition Screening Tool (MST) Report. Pt is well-nourished and eating 100% of his meals. Pt without any significant fat or muscle mass loss on physical exam. Per review of weights below - pt with non-significant weight loss since last March. Non-significant weight loss is beneficial for this patient.  Pt reports that his appetite is great and would like snacks. Pt reports that he has no preference for any particular snack and RD can order anything. I will arrange HS sandwich. Pt appreciative of RD visit.  Wt Readings from Last 15 Encounters:  05/05/14 237 lb 6.4 oz (107.684 kg)  04/27/14 235 lb 3.2 oz (106.686 kg)  10/27/13 247 lb 8 oz (112.265 kg)  10/27/13 247 lb 8 oz (112.265 kg)  02/12/13 255 lb (115.667 kg)  11/17/10 266 lb (120.657 kg)  09/26/10 256 lb (116.121 kg)  09/15/10 253 lb (114.76 kg)  09/11/10 256 lb (116.121 kg)  09/04/10 254 lb (115.214 kg)  08/09/10 258 lb (117.028 kg)    Body mass index is 32.16 kg/(m^2). Patient meets criteria for Obese Class I based on current BMI.   Current diet order is Regular, patient is consuming approximately 100% of meals at this time. Labs and medications reviewed.   No nutrition interventions warranted at this time. If nutrition issues arise, please consult RD.   Jarold Motto MS, RD, LDN Inpatient Registered Dietitian Pager: [phone] After-hours pager: [phone]

## 2014-05-06 NOTE — Progress Notes (Signed)
Occupational Therapy Session Note  Patient Details  Name: Nicholas [name] MRN: [id number] Date of Birth: [birth year]  Today's Date: 05/06/2014 Time: 1100-1200 Time Calculation (min): 60 min  Short Term Goals: Week 1:  OT Short Term Goal 1 (Week 1): Pt will transfer to toilet from w/c with mod A x1 using grab bars. OT Short Term Goal 2 (Week 1): Pt will don a shirt with verbal cues only. OT Short Term Goal 3 (Week 1): Pt will don pants over feet with min A and pull over hips with mod A. OT Short Term Goal 4 (Week 1): Pt will stand from EOB and stand with RW with min A. OT Short Term Goal 5 (Week 1): Pt will bathe with min A.      Skilled Therapeutic Interventions/Progress Updates:      Pt seen for BADL retraining of toileting, bathing, and dressing with a focus on dynamic balance, functional mobility, activity tolerance, problem solving. Pt asked if he needed to use the bathroom. Pt stated he was fine, when he removed his brief it was soaking wet and he was not aware that he was wet.  Pt completed all of his self care with supervision and min A with functional mobility.Pt then ambulated to gym wearing L AFO and completed 5 min on UBE at resistance 5. Then ambulated back to his room. Pt resting in w/c with call light in reach.   Therapy Documentation Precautions:  Precautions Precautions: Fall Precaution Comments: Pt has difficulty with reading (education not stroke related); will have difficulty with written materials Restrictions Weight Bearing Restrictions: Yes   Pain:  no c/o pain  ADL: ADL ADL Comments: Refer to FIM  See FIM for current functional status  Therapy/Group: Individual Therapy  Stark Bray Elia Keenum 05/06/2014, 1:27 PM

## 2014-05-06 NOTE — Progress Notes (Signed)
Physical Therapy Session Note  Patient Details  Name: Nicholas [name] MRN: [id number] Date of Birth: Jan 06, 1951  Today's Date: 05/06/2014 Time: 0800-0900; 1779-3903 Time Calculation (min): 60 min, 45 min  Short Term Goals: Week 2:  PT Short Term Goal 1 (Week 2): pt will perform basic transfer consistently with supervision, 1 or less cue for technique PT Short Term Goal 2 (Week 2): pt will propel w/c to/from therapy with 1 cue to initiate, distant supervision PT Short Term Goal 3 (Week 2): pt will perform gait x 100' with min assist including turns PT Short Term Goal 4 (Week 2): pt will ascend/descend 5 steps 2 rails with supervision, 1 or less cue for technique  Skilled Therapeutic Interventions/Progress Updates:  Tx 1:  Pt stated he did not need to use toilet.  Self stretching bil hamstrings and heel cords with set up assist, in sitting with use of foot stool and strap. X 30 seconds x 3 each.  neuromuscular re-education via forced use, manual and VCs for bil hip adduction, in sitting, against resistance; gait pushing weighted grocery cart x 75' x 2 with min assist, with bari RW x 85' including 3 turns to L with mod assist; up/down 5 steps 2 rails with min assist to ascend, mod to descend, leading with RLE, step/taps onto 4" high stool, requiring mod assist for balance, focusing on sufficient hip/knee flexion to clear feet  Pt demonstrates decreased awareness LLE during ambulation, with L foot frequently bumping leg of Rw especially during turns L.  W/c propulsion using bil UEs with cues for improved efficiency and coordination of UEs, 150' with supervision. Pt needed multiple cues to find room.  Tx 2:  Pt agreeable to try to urinate.  Toilet transfer and toileting with min assist for balance. Pt had BM; Nsg informed.  Patient demonstrates increased fall risk as noted by score of  23 /56 on Berg Balance Scale.  (<36= high risk for falls, close to 100%; 37-45 significant >80%; 46-51  moderate >50%; 52-55 lower >25%).  Discussed balance problems and need for further therapy.  Gait with RW x 50' with close supervision/min guard assist during turns due to L foot catching RW leg.    Therapy Documentation Precautions:  Precautions Precautions: Fall Precaution Comments: Pt has difficulty with reading (education not stroke related); will have difficulty with written materials Restrictions Weight Bearing Restrictions: Yes   Pain: Pain Assessment Pain Assessment: No/denies pain      See FIM for current functional status  Therapy/Group: Individual Therapy  Susa Loffler 05/06/2014, 11:29 AM

## 2014-05-06 NOTE — Progress Notes (Signed)
Inpatient Diabetes Program Recommendations  AACE/ADA: New Consensus Statement on Inpatient Glycemic Control (2013)  Target Ranges:  Prepandial:   less than 140 mg/dL      Peak postprandial:   less than 180 mg/dL (1-2 hours)      Critically ill patients:  140 - 180 mg/dL   Reason for Visit: Hypoglycemia  Inpatient Diabetes Program Recommendations Insulin - Meal Coverage: D/C glipizide while inpatient to prevent hypoglycemia  Note: Will continue to follow. Thank you. Ailene Ards, RD, LDN, CDE Inpatient Diabetes Coordinator [phone]

## 2014-05-07 ENCOUNTER — Inpatient Hospital Stay (HOSPITAL_COMMUNITY): Payer: Medicare Other | Admitting: Occupational Therapy

## 2014-05-07 ENCOUNTER — Inpatient Hospital Stay (HOSPITAL_COMMUNITY): Payer: Medicare Other | Admitting: *Deleted

## 2014-05-07 ENCOUNTER — Inpatient Hospital Stay (HOSPITAL_COMMUNITY): Payer: Medicare Other | Admitting: Speech Pathology

## 2014-05-07 DIAGNOSIS — I10 Essential (primary) hypertension: Secondary | ICD-10-CM

## 2014-05-07 DIAGNOSIS — I6340 Cerebral infarction due to embolism of unspecified cerebral artery: Secondary | ICD-10-CM

## 2014-05-07 DIAGNOSIS — I69919 Unspecified symptoms and signs involving cognitive functions following unspecified cerebrovascular disease: Secondary | ICD-10-CM

## 2014-05-07 DIAGNOSIS — I2510 Atherosclerotic heart disease of native coronary artery without angina pectoris: Secondary | ICD-10-CM

## 2014-05-07 DIAGNOSIS — E1149 Type 2 diabetes mellitus with other diabetic neurological complication: Secondary | ICD-10-CM

## 2014-05-07 LAB — CK TOTAL AND CKMB (NOT AT ARMC)
CK, MB: 3.1 ng/mL (ref 0.3–4.0)
Relative Index: 1.2 (ref 0.0–2.5)
Total CK: 265 U/L — ABNORMAL HIGH (ref 7–232)

## 2014-05-07 LAB — GLUCOSE, CAPILLARY
GLUCOSE-CAPILLARY: 82 mg/dL (ref 70–99)
Glucose-Capillary: 99 mg/dL (ref 70–99)
Glucose-Capillary: 109 mg/dL — ABNORMAL HIGH (ref 70–99)
Glucose-Capillary: 68 mg/dL — ABNORMAL LOW (ref 70–99)
Glucose-Capillary: 143 mg/dL — ABNORMAL HIGH (ref 70–99)

## 2014-05-07 LAB — TROPONIN I: Troponin I: <0.3 ng/mL (ref <0.30)

## 2014-05-07 NOTE — Progress Notes (Signed)
Occupational Therapy Weekly Progress Note  Patient Details  Name: Nicholas [name] MRN: [id number] Date of Birth: Oct 27, 1951  Beginning of progress report period: Apr 29, 2014 End of progress report period: May 07, 2014  Today's Date: 05/07/2014 Time: 4742-5956 Time Calculation (min): 45 min  Patient has met 5 of 5 short term goals.  Pt has been progressing extremely well with his praxis, initiation, balance, and endurance to allow him to complete his basic self care with supervision. Needs min A with transfers and mobility.  Patient continues to demonstrate the following deficits: decreased awareness and initiation to call for help to use the bathroom with daily incontinence, decreased dynamic balance that requires him to need supervision with self care and therefore will continue to benefit from skilled OT intervention to enhance overall performance with BADL.  Patient progressing toward long term goals..  Continue plan of care.  OT Short Term Goals Week 1:  OT Short Term Goal 1 (Week 1): Pt will transfer to toilet from w/c with mod A x1 using grab bars. OT Short Term Goal 1 - Progress (Week 1): Met OT Short Term Goal 2 (Week 1): Pt will don a shirt with verbal cues only. OT Short Term Goal 2 - Progress (Week 1): Met OT Short Term Goal 3 (Week 1): Pt will don pants over feet with min A and pull over hips with mod A. OT Short Term Goal 3 - Progress (Week 1): Met OT Short Term Goal 4 (Week 1): Pt will stand from EOB and stand with RW with min A. OT Short Term Goal 4 - Progress (Week 1): Met OT Short Term Goal 5 (Week 1): Pt will bathe with min A. OT Short Term Goal 5 - Progress (Week 1): Met Week 2:  OT Short Term Goal 1 (Week 2): Pt will be able to make a simple sandwich with min A. OT Short Term Goal 2 (Week 2): Pt will be able to ambulate to toilet with RW with supervision.  OT Short Term Goal 3 (Week 2): Pt will be able to accurately state if he needs to use the restroom or if he  has had an accident with min cues.  Skilled Therapeutic Interventions/Progress Updates:      Pt seen for BADL retraining of  bathing and dressing with a focus on dynamic balance, problem solving, and safety awareness. Pt stated he had just used the bathroom with the NT and was dry.  Pt ambulated to his closet with RW to find and retreive his clothing and then into bathroom. No cuing needed during bathing. Pt walked out to wc in his room. 1x pt stood up to pull his pants up but did not have his balance set so he fell back into the wc. Discussed with pt the importance of being aware of his balance prior to completing the task at hand.  Pt was handed his AFO already placed in his shoe and did well problem solving to don it on his L foot. Min cues to fasten straps. Pt resting in w/c with call light in reach.   Therapy Documentation Precautions:  Precautions Precautions: Fall Precaution Comments: Pt has difficulty with reading (education not stroke related); will have difficulty with written materials Restrictions Weight Bearing Restrictions: Yes  Vital Signs: Therapy Vitals Pulse Rate: 70 BP: 142/48 mmHg Patient Position (if appropriate): Sitting Pain: Pain Assessment Pain Assessment: No/denies pain ADL: ADL ADL Comments: Refer to FIM  See FIM for current functional status  Therapy/Group:  Individual Therapy  Harlene Ramus 05/07/2014, 11:49 AM

## 2014-05-07 NOTE — Progress Notes (Signed)
Physical Therapy Session Note  Patient Details  Name: Nicholas [name] MRN: [id number] Date of Birth: 07-Feb-1951  Today's Date: 05/07/2014 Time: 0901-1000 and 13:00-13:45 ( )  Time Calculation (min): 59 min   Short Term Goals: Week 2:  PT Short Term Goal 1 (Week 2): pt will perform basic transfer consistently with supervision, 1 or less cue for technique PT Short Term Goal 2 (Week 2): pt will propel w/c to/from therapy with 1 cue to initiate, distant supervision PT Short Term Goal 3 (Week 2): pt will perform gait x 100' with min assist including turns PT Short Term Goal 4 (Week 2): pt will ascend/descend 5 steps 2 rails with supervision, 1 or less cue for technique Week 3:     Skilled Therapeutic Interventions/Progress Updates:  First tx focused on gait training and NMR, co-treat with clinical specialist.  Pt up in [name], needing to use bathroom, performed toilet transfer and functional mobility during dressing/cleaning with min A overall for steadying. Pt continues to perform all tasks with minimal LLE weight-shifting, but had no LOB.  Daughter brought in additional slip-on shoes, which were too small for brace and L swelling. AFO used on LLE, order placed and orthotics called.   Performed gait training with RW, no device, and SPC in hall with manual facilitation for increased L weight shifting, trunk derotation, and cues for increasing step length, decreasing step width. Focused on one step at a time so pt could feel the rhythm and shifting. Performance improved with practice, but decreased with fatigue.   Pt feeling happy with his progress and learning new strategies for safer mobility.   --------------------------- Second tx focused on transfers, gait with SPC, and Litegait treadmill training.  Pt up in [name], agreeable to attempt urinating. Pt was dry and was able to urinate, with close S standing balance.   Performed gait in controlled setting, pt wanting to use SPC this afternoon. Pt  continued to need cues for decreasing step width, but step length has improved.   Pt performed static standing while donning harness with close S with weight shifting cues.  Pt performed gait training x87min over 400' at 0.25mph with manual facilitation for L weight shifting, which improved step length and timing. Pt limited by fatigue and discomfort with harness. Staff pushed WC due to fatigue.  [name] from [facility] arrived to assess brace, and continued to encourage pt to get more appropriate shoes.      Therapy Documentation Precautions:  Precautions Precautions: Fall Precaution Comments: Pt has difficulty with reading (education not stroke related); will have difficulty with written materials Restrictions Weight Bearing Restrictions: No    Vital Signs: Therapy Vitals Pulse Rate: 70 BP: 142/48 mmHg Patient Position (if appropriate): Sitting Pain: None      Locomotion : Ambulation Ambulation/Gait Assistance: 4: Min assist   See FIM for current functional status  Therapy/Group: Individual Therapy Clydene Laming, PT, DPT  05/07/2014, 12:54 PM

## 2014-05-07 NOTE — Progress Notes (Signed)
Speech Language Pathology Daily Session Note  Patient Details  Name: Nicholas [name] MRN: [id number] Date of Birth: [birth year]  Today's Date: 05/07/2014 Time: 4401-0272 Time Calculation (min): 43 min  Short Term Goals: Week 2: SLP Short Term Goal 1 (Week 2): Patient will demonstrate basic problem solving during self-care tasks with Min verbal cues SLP Short Term Goal 2 (Week 2): Patient will sustain attention to basic self-care tasks for 10 minutes with Min cues SLP Short Term Goal 3 (Week 2): Patient will label 3 deficits with Mod question cues SLP Short Term Goal 4 (Week 2): Patient will follow multi-step commands during functional tasks with Min vebral cues  Skilled Therapeutic Interventions:  Pt was seen for skilled speech therapy targeting memory, executive function, and awareness.  Pt required max assist to recall 2/3 rules of a structured new learning task from yesterday's treatment session.  Pt also required max-total assist for a basic deductive reasoning task due to working memory, organization, and error awareness impairments.  Pt refused to complete the task saying that he "didn't want to learn that."  SLP suspects that pt's refusal was directly related to the task difficulty in conjunction with poor intellectual awareness as to why the task was difficult.  Pt was unable to identify 1 cognitive impairment that has occurred as a result of this acute stroke and stated that this stroke has not changed his "thinking skills."  SLP reviewed pt's current goals and rationale behind inpatient rehab program in order to increase functional independence and reduce burden of care upon discharge.  When SLP attempted to engaged pt in a more functional financial management task, pt offered conflicting reports as to his baseline.  Initially, pt stated that he managed his finances independently; however, when oriented to financial management task, pt stated that he didn't want to attempt task because his  daughter handled his finances.  Pt was agreeable to participate in a card game and required mod-max cuing for error awareness, organization, and sustained attention.  Overall, pt appeared significantly more flat today in comparison to previous sessions.    FIM:  Comprehension Comprehension Mode: Auditory Comprehension: 5-Understands basic 90% of the time/requires cueing < 10% of the time Expression Expression Mode: Verbal Expression: 5-Expresses basic 90% of the time/requires cueing < 10% of the time. Social Interaction Social Interaction: 4-Interacts appropriately 75 - 89% of the time - Needs redirection for appropriate language or to initiate interaction. Problem Solving Problem Solving: 3-Solves basic 50 - 74% of the time/requires cueing 25 - 49% of the time Memory Memory: 2-Recognizes or recalls 25 - 49% of the time/requires cueing 51 - 75% of the time  Pain Pain Assessment Pain Assessment: No/denies pain  Therapy/Group: Individual Therapy  Jackalyn Lombard, M.A. CCC-SLP   Nicholas [name] 05/07/2014, 8:48 AM

## 2014-05-07 NOTE — Progress Notes (Signed)
Hypoglycemic Event  CBG: 68  Treatment: 15 GM carbohydrate snack  Symptoms: None  Follow-up CBG: Time:1722 CBG Result:109  Possible Reasons for Event: Unknown  Comments/MD notified:Pam Love, PA notified; no new orders received.    Nicholas [name]  Remember to initiate Hypoglycemia Order Set & complete

## 2014-05-07 NOTE — Progress Notes (Signed)
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 Patient lives alone independent with a cane prior to admission. Admitted 04/26/2014 and altered mental status.  MRI with acute nonhemorrhagic left ACA territory infarct as well as remote right parietal lobe infarct. 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  Subjective/Complaints: No new issues over nite, alert and without c/os  Review of Systems - unable to obtain secondary to mental status  Objective: Vital Signs: Blood pressure 129/55, pulse 71, temperature 98.4 F (36.9 C), temperature source Oral, resp. rate 18, height 6' 0.05" (1.83 m), weight 107.684 kg (237 lb 6.4 oz), SpO2 96.00%. No results found. Results for orders placed during the hospital encounter of 04/28/14 (from the past 72 hour(s))  GLUCOSE, CAPILLARY     Status: None   Collection Time    05/04/14  7:28 AM      Result Value Ref Range   Glucose-Capillary 92  70 - 99 mg/dL   Comment 1 Notify RN    GLUCOSE, CAPILLARY     Status: Abnormal   Collection Time    05/04/14 11:44 AM      Result Value Ref Range   Glucose-Capillary 100 (*) 70 - 99 mg/dL   Comment 1 Notify RN    GLUCOSE, CAPILLARY     Status: None   Collection Time    05/04/14  4:28 PM      Result Value Ref Range   Glucose-Capillary 79  70 - 99 mg/dL   Comment 1 Notify RN    GLUCOSE, CAPILLARY     Status: None   Collection Time    05/04/14  9:13 PM      Result Value Ref Range   Glucose-Capillary 99  70 - 99 mg/dL  CREATININE, SERUM     Status: Abnormal   Collection Time    05/05/14  5:00 AM      Result Value Ref Range   Creatinine, Ser 1.09  0.50 - 1.35 mg/dL   GFR calc non Af Amer 71 (*) >90 mL/min   GFR calc Af Amer 82 (*) >90 mL/min   Comment: (NOTE)     The eGFR has been calculated using the CKD EPI equation.     This calculation has not been validated in all clinical situations.     eGFR's persistently <90 mL/min signify possible Chronic Kidney     Disease.  GLUCOSE, CAPILLARY     Status: Abnormal   Collection Time    05/05/14  7:24 AM      Result Value Ref Range   Glucose-Capillary 104 (*) 70 - 99 mg/dL   Comment 1 Notify RN    GLUCOSE, CAPILLARY     Status: None   Collection Time    05/05/14 11:45 AM      Result Value Ref Range   Glucose-Capillary 97  70 - 99 mg/dL   Comment 1 Notify RN    GLUCOSE, CAPILLARY     Status: Abnormal   Collection Time    05/05/14  4:57 PM      Result Value Ref Range   Glucose-Capillary 69 (*) 70 - 99 mg/dL   Comment 1 Notify RN    GLUCOSE, CAPILLARY  Status: Abnormal   Collection Time    05/05/14  5:30 PM      Result Value Ref Range   Glucose-Capillary 128 (*) 70 - 99 mg/dL   Comment 1 Notify RN    GLUCOSE, CAPILLARY     Status: Abnormal   Collection Time    05/05/14  8:32 PM      Result Value Ref Range   Glucose-Capillary 250 (*) 70 - 99 mg/dL  GLUCOSE, CAPILLARY     Status: Abnormal   Collection Time    05/06/14  7:37 AM      Result Value Ref Range   Glucose-Capillary 111 (*) 70 - 99 mg/dL   Comment 1 Notify RN    GLUCOSE, CAPILLARY     Status: None   Collection Time    05/06/14 12:05 PM      Result Value Ref Range   Glucose-Capillary 91  70 - 99 mg/dL   Comment 1 Notify RN    GLUCOSE, CAPILLARY     Status: None   Collection Time    05/06/14  4:44 PM      Result Value Ref Range   Glucose-Capillary 84  70 - 99 mg/dL   Comment 1 Notify RN    GLUCOSE, CAPILLARY     Status: Abnormal   Collection Time    05/06/14  8:38 PM      Result Value Ref Range   Glucose-Capillary 143 (*) 70 - 99 mg/dL      Head: Normocephalic.  Eyes:  Pupils  reactive to light  Neck: Normal range of motion. Neck supple. No thyromegaly present.  Cardiovascular: Normal rate and regular rhythm.  Respiratory: Effort normal and breath sounds normal. No respiratory distress.  GI: Soft. Bowel sounds are normal. He exhibits no distension.  Neurological:  Flat affect. Marice Potter awareness of deficits  Skin: Skin is warm and dry.  Scar right side of neck  motor strength is 5/5 bilateral deltoid, bicep, tricep, grip  Minimal dysmetria bilaterally finger-nose-finger  Right lower extremity 4/5 hip flexor knee extensor ankle dorsi flexion plantar flexor  Left extremity 4/5 in the left hip flexor knee extensor 3 minus ankle dorsiflexor plantar flexor      Assessment/Plan: 1. Functional deficits secondary to embolic left ACA infarct which require 3+ hours per day of interdisciplinary therapy in a comprehensive inpatient rehab setting. Physiatrist is providing close team supervision and 24 hour management of active medical problems listed below. Physiatrist and rehab team continue to assess barriers to discharge/monitor patient progress toward functional and medical goals.  FIM: FIM - Bathing Bathing Steps Patient Completed: Chest;Right Arm;Left Arm;Abdomen;Right upper leg;Left upper leg;Front perineal area;Buttocks;Right lower leg (including foot);Left lower leg (including foot) Bathing: 5: Supervision: Safety issues/verbal cues  FIM - Upper Body Dressing/Undressing Upper body dressing/undressing steps patient completed: Thread/unthread left sleeve of pullover shirt/dress;Put head through opening of pull over shirt/dress;Thread/unthread right sleeve of pullover shirt/dresss;Pull shirt over trunk Upper body dressing/undressing: 5: Set-up assist to: Obtain clothing/put away FIM - Lower Body Dressing/Undressing Lower body dressing/undressing steps patient completed: Thread/unthread right pants leg;Thread/unthread left pants leg;Don/Doff right shoe;Don/Doff left  shoe;Pull pants up/down;Fasten/unfasten pants;Don/Doff right sock;Don/Doff left sock;Thread/unthread right underwear leg;Thread/unthread left underwear leg;Pull underwear up/down Lower body dressing/undressing: 5: Supervision: Safety issues/verbal cues  FIM - Toileting Toileting steps completed by patient: Adjust clothing prior to toileting Toileting Assistive Devices: Grab bar or rail for support Toileting: 2: Max-Patient completed 1 of 3 steps  FIM - Radio producer Devices: Environmental consultant  Toilet Transfers: 4-To toilet/BSC: Min A (steadying Pt. > 75%);4-From toilet/BSC: Min A (steadying Pt. > 75%)  FIM - Bed/Chair Transfer Bed/Chair Transfer Assistive Devices: Arm rests;Cane Bed/Chair Transfer: 4: Supine > Sit: Min A (steadying Pt. > 75%/lift 1 leg);4: Sit > Supine: Min A (steadying pt. > 75%/lift 1 leg);4: Bed > Chair or W/C: Min A (steadying Pt. > 75%);4: Chair or W/C > Bed: Min A (steadying Pt. > 75%)  FIM - Locomotion: Wheelchair Distance: 150 Locomotion: Wheelchair: 5: Travels 150 ft or more: maneuvers on rugs and over door sills with supervision, cueing or coaxing FIM - Locomotion: Ambulation Locomotion: Ambulation Assistive Devices: Administrator Ambulation/Gait Assistance: 4: Min assist Locomotion: Ambulation: 2: Travels 50 - 149 ft with minimal assistance (Pt.>75%)  Comprehension Comprehension Mode: Auditory Comprehension: 5-Follows basic conversation/direction: With extra time/assistive device  Expression Expression Mode: Verbal Expression: 5-Expresses basic needs/ideas: With extra time/assistive device  Social Interaction Social Interaction: 4-Interacts appropriately 75 - 89% of the time - Needs redirection for appropriate language or to initiate interaction.  Problem Solving Problem Solving: 3-Solves basic 50 - 74% of the time/requires cueing 25 - 49% of the time  Memory Memory: 2-Recognizes or recalls 25 - 49% of the time/requires cueing 51  - 75% of the time Medical Problem List and Plan:  1. Functional deficits secondary to left ACA distribution infarct suspect embolic secondary to unclear source  2. DVT Prophylaxis/Anticoagulation: Subcutaneous Lovenox. Monitor platelet counts and any signs of bleeding  3. Pain Management: Tylenol as needed  4. Hypertension. Cardizem 360 mg daily, Cardura 2 mg daily, Lopressor 50 mg twice a day. Monitor with increased mobility BP on low side this am write parameters 5. Neuropsych: This patient is not yet capable of making decisions on his own behalf.  6. Diabetes mellitus with peripheral neuropathy. Hemoglobin A1c 5.7. Glipizide 5 mg daily. Check blood sugars a.c. and at bedtime. Provide diabetic teaching.  7. Hyperlipidemia. Lipitor  8. CAD with stenting. Cardiac enzymes negative. Continue aspirin Plavix therapy. No chest pain or shortness of breath.  9. History of BPH. Check PVRs x3 10.  Left foot drop- pt gives hx of back problem causing Left foot weakness but also has had prior Right parietal CVA,  PT note states AFO made Ext rotation and leg whip worse 11. Abd pain   LOS (Days) 9 A FACE TO FACE EVALUATION WAS PERFORMED  Charlett Blake 05/07/2014, 7:18 AM

## 2014-05-07 NOTE — Progress Notes (Signed)
Patient had been feeling better but now states that his heart is racing again.  Vitals obtained.  Marissa Nestle, PA notified of patient complaint, blood pressure, and heart rate.  New order received to give 2000 dose of metoprolol now.  Will continue to monitor.

## 2014-05-07 NOTE — Progress Notes (Signed)
EKG results given to Deatra Ina, PA; no new orders received.  Will continue to monitor.

## 2014-05-07 NOTE — Progress Notes (Signed)
Patient states "they worked me so hard that my chest is hurting."  Per patient, he feels "pressure" on his chest.  Vitals obtained; heart rate 142.  Deatra Ina, PA notified of patient complaints; new order for an EKG and lab work.  Will continue to monitor.

## 2014-05-07 NOTE — Progress Notes (Signed)
Notified Deatra Ina, PA that patient's blood pressure is 142/48 manually, heart rate 70; new order to hold this morning's dose of metoprolol.  Will continue to monitor.

## 2014-05-08 ENCOUNTER — Inpatient Hospital Stay (HOSPITAL_COMMUNITY): Payer: Medicare Other

## 2014-05-08 LAB — GLUCOSE, CAPILLARY
GLUCOSE-CAPILLARY: 99 mg/dL (ref 70–99)
GLUCOSE-CAPILLARY: 108 mg/dL — AB (ref 70–99)
Glucose-Capillary: 101 mg/dL — ABNORMAL HIGH (ref 70–99)
Glucose-Capillary: 67 mg/dL — ABNORMAL LOW (ref 70–99)
Glucose-Capillary: 94 mg/dL (ref 70–99)

## 2014-05-08 LAB — TROPONIN I: Troponin I: <0.3 ng/mL (ref <0.30)

## 2014-05-08 NOTE — Progress Notes (Signed)
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 Patient lives alone independent with a cane prior to admission. Admitted 04/26/2014 and altered mental status.  MRI with acute nonhemorrhagic left ACA territory infarct as well as remote right parietal lobe infarct. 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  Subjective/Complaints: No new issues over nite, had feeling of racing HR yesterday but no other issues Abd pain last noc givne sorbitol Review of Systems - unable to obtain secondary to mental status  Objective: Vital Signs: Blood pressure 134/52, pulse 76, temperature 98 F (36.7 C), temperature source Oral, resp. rate 18, height 6' 0.05" (1.83 m), weight 107.684 kg (237 lb 6.4 oz), SpO2 96.00%. No results found. Results for orders placed during the hospital encounter of 04/28/14 (from the past 72 hour(s))  GLUCOSE, CAPILLARY     Status: None   Collection Time    05/05/14 11:45 AM      Result Value Ref Range   Glucose-Capillary 97  70 - 99 mg/dL   Comment 1 Notify RN    GLUCOSE, CAPILLARY     Status: Abnormal   Collection Time    05/05/14  4:57 PM      Result Value Ref Range   Glucose-Capillary 69 (*) 70 - 99 mg/dL   Comment 1 Notify RN    GLUCOSE, CAPILLARY     Status: Abnormal   Collection Time    05/05/14  5:30 PM      Result Value Ref Range   Glucose-Capillary 128 (*) 70 - 99 mg/dL   Comment 1 Notify RN    GLUCOSE, CAPILLARY     Status: Abnormal   Collection Time    05/05/14  8:32 PM      Result Value Ref Range   Glucose-Capillary 250 (*) 70 - 99 mg/dL  GLUCOSE, CAPILLARY     Status: Abnormal   Collection Time    05/06/14  7:37 AM      Result Value Ref Range   Glucose-Capillary 111 (*) 70 - 99 mg/dL   Comment 1 Notify RN    GLUCOSE, CAPILLARY     Status: None   Collection Time    05/06/14 12:05 PM      Result Value Ref Range   Glucose-Capillary 91  70 - 99 mg/dL   Comment 1 Notify RN    GLUCOSE, CAPILLARY     Status: None   Collection Time    05/06/14  4:44 PM      Result Value Ref Range   Glucose-Capillary 84  70 - 99 mg/dL   Comment 1 Notify RN    GLUCOSE, CAPILLARY     Status: Abnormal   Collection Time    05/06/14  8:38 PM      Result Value Ref Range   Glucose-Capillary 143 (*) 70 - 99 mg/dL  GLUCOSE, CAPILLARY     Status: None   Collection Time    05/07/14  7:51 AM      Result Value Ref Range   Glucose-Capillary 99  70 - 99 mg/dL  GLUCOSE, CAPILLARY     Status: None   Collection Time    05/07/14 11:25 AM      Result Value Ref Range  Glucose-Capillary 82  70 - 99 mg/dL  TROPONIN I     Status: None   Collection Time    05/07/14  2:28 PM      Result Value Ref Range   Troponin I <0.30  <0.30 ng/mL   Comment:            Due to the release kinetics of cTnI,     a negative result within the first hours     of the onset of symptoms does not rule out     myocardial infarction with certainty.     If myocardial infarction is still suspected,     repeat the test at appropriate intervals.  CK TOTAL AND CKMB     Status: Abnormal   Collection Time    05/07/14  3:00 PM      Result Value Ref Range   Total CK 265 (*) 7 - 232 U/L   CK, MB 3.1  0.3 - 4.0 ng/mL   Relative Index 1.2  0.0 - 2.5  GLUCOSE, CAPILLARY     Status: Abnormal   Collection Time    05/07/14  5:00 PM      Result Value Ref Range   Glucose-Capillary 68 (*) 70 - 99 mg/dL  GLUCOSE, CAPILLARY     Status: Abnormal   Collection Time    05/07/14  5:22 PM      Result Value Ref Range   Glucose-Capillary 109 (*) 70 - 99 mg/dL  GLUCOSE, CAPILLARY     Status: Abnormal   Collection Time    05/07/14   8:21 PM      Result Value Ref Range   Glucose-Capillary 143 (*) 70 - 99 mg/dL  TROPONIN I     Status: None   Collection Time    05/07/14  8:28 PM      Result Value Ref Range   Troponin I <0.30  <0.30 ng/mL   Comment:            Due to the release kinetics of cTnI,     a negative result within the first hours     of the onset of symptoms does not rule out     myocardial infarction with certainty.     If myocardial infarction is still suspected,     repeat the test at appropriate intervals.  TROPONIN I     Status: None   Collection Time    05/08/14  5:49 AM      Result Value Ref Range   Troponin I <0.30  <0.30 ng/mL   Comment:            Due to the release kinetics of cTnI,     a negative result within the first hours     of the onset of symptoms does not rule out     myocardial infarction with certainty.     If myocardial infarction is still suspected,     repeat the test at appropriate intervals.  GLUCOSE, CAPILLARY     Status: Abnormal   Collection Time    05/08/14  7:23 AM      Result Value Ref Range   Glucose-Capillary 101 (*) 70 - 99 mg/dL      Head: Normocephalic.  Eyes:  Pupils reactive to light  Neck: Normal range of motion. Neck supple. No thyromegaly present.  Cardiovascular: Normal rate and regular rhythm.  Respiratory: Effort normal and breath sounds normal. No respiratory distress.  GI: Soft. Bowel sounds are normal.  He exhibits no distension.  Neurological:  Flat affect. Morene Antu awareness of deficits  Skin: Skin is warm and dry.  Scar right side of neck  motor strength is 5/5 bilateral deltoid, bicep, tricep, grip  Minimal dysmetria bilaterally finger-nose-finger  Right lower extremity 4/5 hip flexor knee extensor ankle dorsi flexion plantar flexor  Left extremity 4/5 in the left hip flexor knee extensor 3 minus ankle dorsiflexor plantar flexor      Assessment/Plan: 1. Functional deficits secondary to embolic left ACA infarct which require 3+ hours  per day of interdisciplinary therapy in a comprehensive inpatient rehab setting. Physiatrist is providing close team supervision and 24 hour management of active medical problems listed below. Physiatrist and rehab team continue to assess barriers to discharge/monitor patient progress toward functional and medical goals.  FIM: FIM - Bathing Bathing Steps Patient Completed: Chest;Right Arm;Left Arm;Abdomen;Right upper leg;Left upper leg;Front perineal area;Buttocks;Right lower leg (including foot);Left lower leg (including foot) Bathing: 5: Supervision: Safety issues/verbal cues  FIM - Upper Body Dressing/Undressing Upper body dressing/undressing steps patient completed: Thread/unthread left sleeve of pullover shirt/dress;Put head through opening of pull over shirt/dress;Thread/unthread right sleeve of pullover shirt/dresss;Pull shirt over trunk Upper body dressing/undressing: 5: Supervision: Safety issues/verbal cues FIM - Lower Body Dressing/Undressing Lower body dressing/undressing steps patient completed: Thread/unthread right pants leg;Thread/unthread left pants leg;Don/Doff right shoe;Don/Doff left shoe;Pull pants up/down;Fasten/unfasten pants;Don/Doff right sock;Don/Doff left sock;Thread/unthread right underwear leg;Thread/unthread left underwear leg;Pull underwear up/down Lower body dressing/undressing: 5: Supervision: Safety issues/verbal cues  FIM - Toileting Toileting steps completed by patient: Adjust clothing prior to toileting Toileting Assistive Devices: Grab bar or rail for support Toileting: 2: Max-Patient completed 1 of 3 steps  FIM - Diplomatic Services operational officer Devices: Grab bars Toilet Transfers: 4-To toilet/BSC: Min A (steadying Pt. > 75%);4-From toilet/BSC: Min A (steadying Pt. > 75%)  FIM - Bed/Chair Transfer Bed/Chair Transfer Assistive Devices: Arm rests;Cane Bed/Chair Transfer: 4: Chair or W/C > Bed: Min A (steadying Pt. > 75%);4: Bed > Chair or  W/C: Min A (steadying Pt. > 75%)  FIM - Locomotion: Wheelchair Distance: 150 Locomotion: Wheelchair: 0: Activity did not occur FIM - Locomotion: Ambulation Locomotion: Ambulation Assistive Devices: Emergency planning/management officer Ambulation/Gait Assistance: 4: Min assist Locomotion: Ambulation: 4: Travels 150 ft or more with minimal assistance (Pt.>75%)  Comprehension Comprehension Mode: Auditory Comprehension: 5-Understands basic 90% of the time/requires cueing < 10% of the time  Expression Expression Mode: Verbal Expression: 5-Expresses basic 90% of the time/requires cueing < 10% of the time.  Social Interaction Social Interaction: 4-Interacts appropriately 75 - 89% of the time - Needs redirection for appropriate language or to initiate interaction.  Problem Solving Problem Solving: 3-Solves basic 50 - 74% of the time/requires cueing 25 - 49% of the time  Memory Memory: 2-Recognizes or recalls 25 - 49% of the time/requires cueing 51 - 75% of the time Medical Problem List and Plan:  1. Functional deficits secondary to left ACA distribution infarct suspect embolic secondary to unclear source  2. DVT Prophylaxis/Anticoagulation: Subcutaneous Lovenox. Monitor platelet counts and any signs of bleeding  3. Pain Management: Tylenol as needed  4. Hypertension. Cardizem 360 mg daily, Cardura 2 mg daily, Lopressor 50 mg twice a day. Monitor with increased mobility BP on low side this am write parameters 5. Neuropsych: This patient is not yet capable of making decisions on his own behalf.  6. Diabetes mellitus with peripheral neuropathy. Hemoglobin A1c 5.7. Glipizide 5 mg daily. Check blood sugars a.c. and at bedtime. Provide diabetic teaching.  7. Hyperlipidemia. Lipitor  8. CAD with stenting. Cardiac enzymes negative. Continue aspirin Plavix therapy. No chest pain or shortness of breath.  9. History of BPH. Check PVRs x3 10.  Left foot drop- pt gives hx of back problem causing Left foot weakness but  also has had prior Right parietal CVA,  PT note states AFO made Ext rotation and leg whip worse 11. Abd pain improved after sorbitol and BMs  LOS (Days) 10 A FACE TO FACE EVALUATION WAS PERFORMED  Erick Colace 05/08/2014, 10:11 AM

## 2014-05-08 NOTE — Progress Notes (Addendum)
Physical Therapy Note  Patient Details  Name: Nicholas [name] MRN: [id number] Date of Birth: 10/15/1951 Today's Date: 05/08/2014 1430-1530, 60 min, individual  No pain reported  Pt donned new personal AFO with minimal assistance because strap at top is quite short for his leg.   neuromuscular re-education via forced use, demo, cues for bil LE use to propel w/c, self stretching L hamstrings and heel cords using foot stool and strap x 30 sec x 3, gait with weighted grocery cart and SPC with facilitation of L pelvic protraction, wt shifting in supported sitting> unsupported sitting with trunk lengthening/shortening for pelvic dissociation, sit>< stand without use of hands for increased LE wt bearing, beach ball taps with cane in unsupported sitting for trunk ext and pelvic activation, facilitation of hip and ankle strategies for balance recovery.  Gait wearing [place] L AFO with grocery cart x 150' with min guard> supervision assist including turn to L.  With [facility], x 125' with min assist, x 65' with supervision. Pt   Pt demonstrated bil ankle strategy, R> L, and absent hip strategy before facilitation.  After facilitation, pt demonstrated bil hip strategy, R> L. Pt continues to demonstrated extremely delayed and inadequate balance strategies with posterior external perturbations.  Strap too short for new AFO; plan to contact orthotist [name].  See FIM  Susa Loffler 05/08/2014, 2:43 PM

## 2014-05-08 NOTE — Progress Notes (Addendum)
Hypoglycemic Event  CBG: 67  Treatment: 15 GM carbohydrate snack  Symptoms: None  Follow-up CBG: Time:1206 CBG Result:99  Possible Reasons for Event: Unknown  Comments/MD notified:md aware   Nicholas [name] Nicholas [name]  Remember to initiate Hypoglycemia Order Set & complete

## 2014-05-09 ENCOUNTER — Inpatient Hospital Stay (HOSPITAL_COMMUNITY): Payer: Medicare Other

## 2014-05-09 LAB — URINE MICROSCOPIC-ADD ON

## 2014-05-09 LAB — URINALYSIS, ROUTINE W REFLEX MICROSCOPIC
Bilirubin Urine: NEGATIVE
GLUCOSE, UA: NEGATIVE mg/dL
KETONES UR: NEGATIVE mg/dL
Nitrite: POSITIVE — AB
PROTEIN: 100 mg/dL — AB
Specific Gravity, Urine: 1.024 (ref 1.005–1.030)
UROBILINOGEN UA: 0.2 mg/dL (ref 0.0–1.0)
pH: 5 (ref 5.0–8.0)

## 2014-05-09 LAB — GLUCOSE, CAPILLARY
GLUCOSE-CAPILLARY: 109 mg/dL — AB (ref 70–99)
Glucose-Capillary: 76 mg/dL (ref 70–99)
Glucose-Capillary: 98 mg/dL (ref 70–99)
Glucose-Capillary: 107 mg/dL — ABNORMAL HIGH (ref 70–99)

## 2014-05-09 NOTE — Progress Notes (Signed)
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 Patient lives alone independent with a cane prior to admission. Admitted 04/26/2014 and altered mental status.  MRI with acute nonhemorrhagic left ACA territory infarct as well as remote right parietal lobe infarct. 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  Subjective/Complaints: Slept well, ready for breakfast, states he has PT at 10:30 am Review of Systems - unable to obtain secondary to mental status  Objective: Vital Signs: Blood pressure 138/73, pulse 65, temperature 98.2 F (36.8 C), temperature source Oral, resp. rate 16, height 6' 0.05" (1.83 m), weight 107.684 kg (237 lb 6.4 oz), SpO2 97.00%. No results found. Results for orders placed during the hospital encounter of 04/28/14 (from the past 72 hour(s))  GLUCOSE, CAPILLARY     Status: None   Collection Time    05/06/14 12:05 PM      Result Value Ref Range   Glucose-Capillary 91  70 - 99 mg/dL   Comment 1 Notify RN    GLUCOSE, CAPILLARY     Status: None   Collection Time    05/06/14  4:44 PM      Result Value Ref Range   Glucose-Capillary 84  70 - 99 mg/dL   Comment 1 Notify RN    GLUCOSE, CAPILLARY     Status: Abnormal   Collection Time    05/06/14  8:38 PM      Result Value Ref Range   Glucose-Capillary 143 (*) 70 - 99 mg/dL  GLUCOSE, CAPILLARY     Status: None   Collection Time    05/07/14  7:51 AM      Result Value Ref Range   Glucose-Capillary 99  70 - 99 mg/dL  GLUCOSE, CAPILLARY     Status: None   Collection Time    05/07/14 11:25 AM      Result Value Ref Range   Glucose-Capillary 82   70 - 99 mg/dL  TROPONIN I     Status: None   Collection Time    05/07/14  2:28 PM      Result Value Ref Range   Troponin I <0.30  <0.30 ng/mL   Comment:            Due to the release kinetics of cTnI,     a negative result within the first hours     of the onset of symptoms does not rule out     myocardial infarction with certainty.     If myocardial infarction is still suspected,     repeat the test at appropriate intervals.  CK TOTAL AND CKMB     Status: Abnormal   Collection Time    05/07/14  3:00 PM      Result Value Ref Range   Total CK 265 (*) 7 - 232 U/L   CK, MB 3.1  0.3 - 4.0 ng/mL   Relative Index 1.2  0.0 - 2.5  GLUCOSE, CAPILLARY     Status: Abnormal   Collection Time    05/07/14  5:00 PM      Result Value Ref Range   Glucose-Capillary 68 (*) 70 - 99 mg/dL  GLUCOSE, CAPILLARY     Status: Abnormal   Collection  Time    05/07/14  5:22 PM      Result Value Ref Range   Glucose-Capillary 109 (*) 70 - 99 mg/dL  GLUCOSE, CAPILLARY     Status: Abnormal   Collection Time    05/07/14  8:21 PM      Result Value Ref Range   Glucose-Capillary 143 (*) 70 - 99 mg/dL  TROPONIN I     Status: None   Collection Time    05/07/14  8:28 PM      Result Value Ref Range   Troponin I <0.30  <0.30 ng/mL   Comment:            Due to the release kinetics of cTnI,     a negative result within the first hours     of the onset of symptoms does not rule out     myocardial infarction with certainty.     If myocardial infarction is still suspected,     repeat the test at appropriate intervals.  TROPONIN I     Status: None   Collection Time    05/08/14  5:49 AM      Result Value Ref Range   Troponin I <0.30  <0.30 ng/mL   Comment:            Due to the release kinetics of cTnI,     a negative result within the first hours     of the onset of symptoms does not rule out     myocardial infarction with certainty.     If myocardial infarction is still suspected,     repeat the test at  appropriate intervals.  GLUCOSE, CAPILLARY     Status: Abnormal   Collection Time    05/08/14  7:23 AM      Result Value Ref Range   Glucose-Capillary 101 (*) 70 - 99 mg/dL  GLUCOSE, CAPILLARY     Status: Abnormal   Collection Time    05/08/14 11:48 AM      Result Value Ref Range   Glucose-Capillary 67 (*) 70 - 99 mg/dL  GLUCOSE, CAPILLARY     Status: None   Collection Time    05/08/14 12:06 PM      Result Value Ref Range   Glucose-Capillary 99  70 - 99 mg/dL  GLUCOSE, CAPILLARY     Status: None   Collection Time    05/08/14  4:49 PM      Result Value Ref Range   Glucose-Capillary 94  70 - 99 mg/dL  GLUCOSE, CAPILLARY     Status: Abnormal   Collection Time    05/08/14  8:08 PM      Result Value Ref Range   Glucose-Capillary 108 (*) 70 - 99 mg/dL  GLUCOSE, CAPILLARY     Status: Abnormal   Collection Time    05/09/14  7:28 AM      Result Value Ref Range   Glucose-Capillary 109 (*) 70 - 99 mg/dL      Head: Normocephalic.  Eyes:  Pupils reactive to light  Neck: Normal range of motion. Neck supple. No thyromegaly present.  Cardiovascular: Normal rate and regular rhythm.  Respiratory: Effort normal and breath sounds normal. No respiratory distress.  GI: Soft. Bowel sounds are normal. He exhibits no distension.  Neurological:  Flat affect. Morene Antu.Fair awareness of deficits  Skin: Skin is warm and dry.  Scar right side of neck  motor strength is 5/5 bilateral deltoid, bicep, tricep, grip  Minimal dysmetria  bilaterally finger-nose-finger  Right lower extremity 4/5 hip flexor knee extensor ankle dorsi flexion plantar flexor  Left extremity 4/5 in the left hip flexor knee extensor 3 minus ankle dorsiflexor plantar flexor      Assessment/Plan: 1. Functional deficits secondary to embolic left ACA infarct which require 3+ hours per day of interdisciplinary therapy in a comprehensive inpatient rehab setting. Physiatrist is providing close team supervision and 24 hour management of  active medical problems listed below. Physiatrist and rehab team continue to assess barriers to discharge/monitor patient progress toward functional and medical goals.  FIM: FIM - Bathing Bathing Steps Patient Completed: Chest;Right Arm;Right upper leg;Buttocks;Left Arm;Left upper leg;Abdomen;Right lower leg (including foot);Front perineal area;Left lower leg (including foot) Bathing: 5: Supervision: Safety issues/verbal cues  FIM - Upper Body Dressing/Undressing Upper body dressing/undressing steps patient completed: Thread/unthread right sleeve of pullover shirt/dresss;Thread/unthread left sleeve of pullover shirt/dress;Put head through opening of pull over shirt/dress;Pull shirt over trunk Upper body dressing/undressing: 5: Supervision: Safety issues/verbal cues FIM - Lower Body Dressing/Undressing Lower body dressing/undressing steps patient completed: Thread/unthread right pants leg;Thread/unthread left pants leg;Pull pants up/down Lower body dressing/undressing: 5: Supervision: Safety issues/verbal cues  FIM - Toileting Toileting steps completed by patient: Adjust clothing prior to toileting;Performs perineal hygiene;Adjust clothing after toileting Toileting Assistive Devices: Grab bar or rail for support Toileting: 6: More than reasonable amount of time  FIM - Diplomatic Services operational officer Devices: [employer] Transfers: 4-To toilet/BSC: Min A (steadying Pt. > 75%);4-From toilet/BSC: Min A (steadying Pt. > 75%)  FIM - Bed/Chair Transfer Bed/Chair Transfer Assistive Devices: Arm rests;Cane Bed/Chair Transfer: 4: Bed > Chair or W/C: Min A (steadying Pt. > 75%)  FIM - Locomotion: Wheelchair Distance: 150 Locomotion: Wheelchair: 0: Activity did not occur FIM - Locomotion: Ambulation Locomotion: Ambulation Assistive Devices: Emergency planning/management officer Ambulation/Gait Assistance: 4: Min assist Locomotion: Ambulation: 4: Travels 150 ft or more with minimal assistance  (Pt.>75%)  Comprehension Comprehension Mode: Auditory Comprehension: 5-Understands basic 90% of the time/requires cueing < 10% of the time  Expression Expression Mode: Verbal Expression: 5-Expresses basic 90% of the time/requires cueing < 10% of the time.  Social Interaction Social Interaction: 4-Interacts appropriately 75 - 89% of the time - Needs redirection for appropriate language or to initiate interaction.  Problem Solving Problem Solving: 3-Solves basic 50 - 74% of the time/requires cueing 25 - 49% of the time  Memory Memory: 2-Recognizes or recalls 25 - 49% of the time/requires cueing 51 - 75% of the time Medical Problem List and Plan:  1. Functional deficits secondary to left ACA distribution infarct suspect embolic secondary to unclear source  2. DVT Prophylaxis/Anticoagulation: Subcutaneous Lovenox. Monitor platelet counts and any signs of bleeding  3. Pain Management: Tylenol as needed  4. Hypertension. Cardizem 360 mg daily, Cardura 2 mg daily, Lopressor 50 mg twice a day. Monitor with increased mobility BP on low side this am write parameters 5. Neuropsych: This patient is not yet capable of making decisions on his own behalf. Cog status is improving, aware of therapy schedule 6. Diabetes mellitus with peripheral neuropathy. Hemoglobin A1c 5.7. Glipizide 5 mg daily. Check blood sugars a.c. and at bedtime. Provide diabetic teaching.  7. Hyperlipidemia. Lipitor  8. CAD with stenting. Cardiac enzymes negative. Continue aspirin Plavix therapy. No chest pain or shortness of breath.  9. History of BPH. Check PVRs x3 10.  Left foot drop- pt gives hx of back problem causing Left foot weakness but also has had prior Right parietal CVA,  PT  note states AFO made Ext rotation and leg whip worse 11. Abd pain improved after sorbitol and BMs  LOS (Days) 11 A FACE TO FACE EVALUATION WAS PERFORMED  Erick Colace 05/09/2014, 8:29 AM

## 2014-05-09 NOTE — Progress Notes (Signed)
Physical Therapy Session Note  Patient Details  Name: Nicholas [name] MRN: [id number] Date of Birth: Apr 04, 1951  Today's Date: 05/09/2014 Time: 8882-8003 Time Calculation (min): 45 min  Short Term Goals: Week 2:  PT Short Term Goal 1 (Week 2): pt will perform basic transfer consistently with supervision, 1 or less cue for technique PT Short Term Goal 2 (Week 2): pt will propel w/c to/from therapy with 1 cue to initiate, distant supervision PT Short Term Goal 3 (Week 2): pt will perform gait x 100' with min assist including turns PT Short Term Goal 4 (Week 2): pt will ascend/descend 5 steps 2 rails with supervision, 1 or less cue for technique  Skilled Therapeutic Interventions/Progress Updates:    Pt receive sitting in w/c in room and agreeable to therapy. Pt propelled wheel char in hallways 160 feet x1 using BUE. PT encouraged using of BLE to promote LE weight bearing. Pt reports "My legs are weaks, how can I use them?" Pt unable to coordinate use of BLE and BUE together, but can complete LEs and UEs alone. Pt ambulated with SPC 165 feet in hallway with min A and cues to promote increased weight bearing through LLE, pelvic rotation and decreased rotation of the LLE/L ankle. Pt attempted dynamic balance activity in standing to promote increased weight bearing through the LLE, completing x3 reps with mod A, with toe taps of the RLE onto a 2 inch step. Pt demonstrates compensatory strategies to avoid weight bearing through the LLE. Activities to promote weight bearing through the L side/LLE in sitting were performed, including to taps, reaching and balloon taps. Pt also completed sit to stand reps, with PT facilitating active sitting posture and weight shifting to the left during transfers. Pt completed 2x5 reps.  Pt ambulated in hallways again demonstrating increased weight bearing through the LLE. Pt returned to room seated in w/c with all needs met and and within reach.   Therapy  Documentation Precautions:  Precautions Precautions: Fall Precaution Comments: Pt has difficulty with reading (education not stroke related); will have difficulty with written materials Restrictions Weight Bearing Restrictions: No Pain: Pain Assessment Pain Assessment: No/denies pain Pain Score: 0-No pain   See FIM for current functional status  Therapy/Group: Individual Therapy  Iana Buzan R Genavie Boettger 05/09/2014, 12:54 PM

## 2014-05-10 ENCOUNTER — Encounter (HOSPITAL_COMMUNITY): Payer: Medicare Other

## 2014-05-10 ENCOUNTER — Inpatient Hospital Stay (HOSPITAL_COMMUNITY): Payer: Medicare Other | Admitting: Speech Pathology

## 2014-05-10 ENCOUNTER — Inpatient Hospital Stay (HOSPITAL_COMMUNITY): Payer: Medicare Other

## 2014-05-10 ENCOUNTER — Inpatient Hospital Stay (HOSPITAL_COMMUNITY): Payer: Medicare Other | Admitting: Occupational Therapy

## 2014-05-10 DIAGNOSIS — E1149 Type 2 diabetes mellitus with other diabetic neurological complication: Secondary | ICD-10-CM

## 2014-05-10 DIAGNOSIS — I10 Essential (primary) hypertension: Secondary | ICD-10-CM

## 2014-05-10 DIAGNOSIS — I6350 Cerebral infarction due to unspecified occlusion or stenosis of unspecified cerebral artery: Secondary | ICD-10-CM

## 2014-05-10 DIAGNOSIS — I2510 Atherosclerotic heart disease of native coronary artery without angina pectoris: Secondary | ICD-10-CM

## 2014-05-10 DIAGNOSIS — I69919 Unspecified symptoms and signs involving cognitive functions following unspecified cerebrovascular disease: Secondary | ICD-10-CM

## 2014-05-10 DIAGNOSIS — I6340 Cerebral infarction due to embolism of unspecified cerebral artery: Secondary | ICD-10-CM

## 2014-05-10 LAB — GLUCOSE, CAPILLARY
GLUCOSE-CAPILLARY: 89 mg/dL (ref 70–99)
Glucose-Capillary: 79 mg/dL (ref 70–99)
Glucose-Capillary: 98 mg/dL (ref 70–99)
Glucose-Capillary: 191 mg/dL — ABNORMAL HIGH (ref 70–99)

## 2014-05-10 MED ORDER — CEPHALEXIN 250 MG PO CAPS
250.0000 mg | ORAL_CAPSULE | Freq: Three times a day (TID) | ORAL | Status: DC
  Administered 2014-05-10 – 2014-05-13 (×10): 250 mg via ORAL
  Filled 2014-05-10 (×13): qty 1

## 2014-05-10 NOTE — Progress Notes (Signed)
Speech Language Pathology Daily Session Note  Patient Details  Name: Nicholas [name] MRN: [id number] Date of Birth: 1951/02/19  Today's Date: 05/10/2014 Time: 3710-6269 Time Calculation (min): 43 min  Short Term Goals: Week 2: SLP Short Term Goal 1 (Week 2): Patient will demonstrate basic problem solving during self-care tasks with Min verbal cues SLP Short Term Goal 2 (Week 2): Patient will sustain attention to basic self-care tasks for 10 minutes with Min cues SLP Short Term Goal 3 (Week 2): Patient will label 3 deficits with Mod question cues SLP Short Term Goal 4 (Week 2): Patient will follow multi-step commands during functional tasks with Min vebral cues  Skilled Therapeutic Interventions:  Pt was seen for skilled speech therapy targeting functional problem solving.  Pt was approximately 75% accurate for safety awareness/judgment during a basic verbal reasoning task with mod assist, improving to 100% accuracy with max assist.  During the task, pt benefited from mod cues for intellectual awareness to identify current physical and/or cognitive changes as they pertained to the task.  Pt was modified for propelling his wheelchair and functional wayfinding from the dayroom to his room with obstacles and environmental distractions.  Continue per current plan of care    FIM:  Comprehension Comprehension Mode: Auditory Comprehension: 5-Understands basic 90% of the time/requires cueing < 10% of the time Expression Expression Mode: Verbal Expression: 5-Expresses basic 90% of the time/requires cueing < 10% of the time. Social Interaction Social Interaction: 4-Interacts appropriately 75 - 89% of the time - Needs redirection for appropriate language or to initiate interaction. Problem Solving Problem Solving: 3-Solves basic 50 - 74% of the time/requires cueing 25 - 49% of the time Memory Memory: 2-Recognizes or recalls 25 - 49% of the time/requires cueing 51 - 75% of the time  Pain Pain  Assessment Pain Assessment: No/denies pain Pain Score: 0-No pain  Therapy/Group: Individual Therapy  Jackalyn Lombard, M.A. CCC-SLP   [name] 05/10/2014, 12:41 PM

## 2014-05-10 NOTE — Consult Note (Signed)
NEUROCOGNITIVE TESTING - [facility]   [name] is a 63 year old, right-handed man, who was seen for a brief neuropsychological assessment to evaluate his cognitive and emotional functioning in the setting of stroke.  According to his medical record, he was admitted on 04/26/14 with altered mental status.  MRI demonstrated acute nonhemorrhagic left ACA territory infarct as well as remote right parietal lobe infarct.  According to staff members, he has been able to appropriately respond to prompts, but has demonstrated slowed processing speed and continued issues with incontinence.  There was a question as to the extent of cognitive difficulties and if cognitive disruption could be playing a role in incontinence.  Therefore, a neuropsychological consultation was requested.    PROCEDURES: [3 units of 03888 on 05/06/2014]  The following tests were performed during today's visit: Mini Mental Status Examination-2 (brief form), Repeatable Battery for the Assessment of Neuropsychological Status (RBANS, form A), and the Beck Depression Inventory - II (fast screen for medical patients).  Performance validity measures were also administered.  Test results are as follows:   MMSE-2 (Brief) Raw Score = 8/16 Description = Impaired   RBANS Indices Scaled Score Percentile Description  Immediate Memory  57 < 1 Profoundly Impaired  Visuospatial/Constructional 50 < 1 Profoundly Impaired  Language 78 7 Impaired  Attention 43 < 1 Profoundly Impaired  Delayed Memory 48 < 1 Profoundly Impaired  Total Score 49 < 1 Profoundly Impaired   RBANS Subtests Raw Score Percentile Description  List Learning 17 1 Profoundly Impaired  Story Memory 8 < 1 Profoundly Impaired  Figure Copy 3 < 1 Profoundly Impaired  Line Orientation 4 > 1 Profoundly Impaired  Picture Naming 10 73 Average  Semantic Fluency 8 < 1 Profoundly Impaired  Digit Span 4 < 1 Profoundly Impaired  Coding 0 < 1  Profoundly Impaired  List Recall 0 < 1 Profoundly Impaired  List Recognition 15 < 1 Profoundly Impaired  Story Recall 2 < 1 Profoundly Impaired  Figure recall 0 < 1 Profoundly Impaired   BDI (fast) Raw Score = 10 Description = Clinically significant  [name] performances on embedded and objective measures of performance validity were well below expectation.  It should be made clear that we are not surmising that [name] intentionally feigned cognitive impairment.  Rather, other factors (e.g. pain, mood disruption, fatigue, etc.) could have impacted his ability to maintain consistent task engagement.  Nevertheless, his current cognitive test results cannot be reliably interpreted at this time, as they may represent an underestimation of his true cognitive abilities.  We can say, however, that despite questionable task engagement at times, [name] demonstrated intact ability for simple confrontation naming, so we can say that that skill remains intact at this time.  All of his other performances were impaired, though as mentioned, owing to possible poor task engagement, these deficits may not be an accurate reflection of his actual cognitive abilities.    IMPRESSION:  [name] has sustained two strokes in his history, which could cause significant cognitive disruption, though we were unable to determine the exact nature and extent of potential cognitive deficits, given his questionable approach to testing.  Owing to his approach to testing today, we cannot rule out behavioral causes for his urinary incontinence.  From an emotional standpoint, [name] endorsed symptoms consistent with clinically significant depressed mood, likely at a moderate level.  Depressed mood could be contributing to cognitive weaknesses and could explain his poor task engagement on  today's testing.  He would likely benefit from participation in individual psychotherapy post-discharge and may benefit from  engaging in brief supportive therapy while on this unit.  This should be scheduled with Dr. Jacquelyne BalintMcDermott for next week.    DIAGNOSIS:  CVA  Leavy CellaKaren Tasnia Spegal, Psy.D.  Clinical Neuropsychologist

## 2014-05-10 NOTE — Progress Notes (Signed)
Physical Therapy Session Note  Patient Details  Name: Nicholas [name] MRN: [id number] Date of Birth: January 17, 1951  Today's Date: 05/10/2014 Time: 0800-0900, 1510-1540 Time Calculation (min): 60 min, 30 min  Short Term Goals: Week 2:  PT Short Term Goal 1 (Week 2): pt will perform basic transfer consistently with supervision, 1 or less cue for technique PT Short Term Goal 2 (Week 2): pt will propel w/c to/from therapy with 1 cue to initiate, distant supervision PT Short Term Goal 3 (Week 2): pt will perform gait x 100' with min assist including turns PT Short Term Goal 4 (Week 2): pt will ascend/descend 5 steps 2 rails with supervision, 1 or less cue for technique  Skilled Therapeutic Interventions/Progress Updates:   Tx 1:  Pt had wet diaper, but said he had been to toilet recently.  Pt continues to have significant awareness problems with B and B.  Toilet transfer for B and B.  Pt balanced without UE support during gown and brief management at toilet.  Family brought in different shoes, but they are too tight for L foot.  MD stated pt's pedal edema is chronic; therapist requested CSW talk to family again to purchase size 13 light weight shoes.  neuromuscular re-education via demo, set up for L hamstring and heel cord x 30 seconds x 3.   Gait training with SPC, [place] [employer] [place] AFO x 150' with close supervision/ min assist for turns, x 2.  UP/down 3 steps with 1 rail with mod assist, 3 cues, min assist with 2 rails, attempted to try with SPC without rails, but unable with max assist due to inability to transfer wt fully to R foot to step up with L foot. Pt initially reported that his daughter has STE her home, but later said she did not, which is accurate (threshold only).  Simulated car transfer with min guard assist, min cues for safe hand placement.  W/c propulsion x 150' to return to room, 1 cue to initiate, safely arriving back at room with distant supervision.  Tx 2:  W/c  propulsion as above.  Self stretching L hamstrings in sitting without stool, scooted out and LLE extended, bil hands reaching forward on LLE.  Advanced gait with SPC, L AFO, x 40' x 2 stepping over 4 canes with min assist and one 4" high bolster with +2 needed for balance, x 2.  Up/down 5 steps 2 rails, min assist, mod VCs for sequencing.  neuromuscular re-education via forced use, manual cues for sit>< stand without use of UEs, balance reactions in static standing on wedge, wt shifting L<>R in squat for L hip muscle activation  Basic transfer with supervision, poor eccentric control and L awareness during descent.    Therapy Documentation Precautions:  Precautions Precautions: Fall Precaution Comments: Pt has difficulty with reading (education not stroke related); will have difficulty with written materials Restrictions Weight Bearing Restrictions: No   Pain: Pain Assessment Pain Assessment: No/denies pain      See FIM for current functional status  Therapy/Group: Individual Therapy  Susa Loffler 05/10/2014, 10:49 AM

## 2014-05-10 NOTE — Progress Notes (Signed)
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 Patient lives alone independent with a cane prior to admission. Admitted 04/26/2014 and altered mental status.  MRI with acute nonhemorrhagic left ACA territory infarct as well as remote right parietal lobe infarct. 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  Subjective/Complaints: Frequency with urination, some burning, no sweats or chills Review of Systems - unable to obtain secondary to mental status  Objective: Vital Signs: Blood pressure 145/46, pulse 63, temperature 98 F (36.7 C), temperature source Oral, resp. rate 16, height 6' 0.05" (1.83 m), weight 107.684 kg (237 lb 6.4 oz), SpO2 98.00%. No results found. Results for orders placed during the hospital encounter of 04/28/14 (from the past 72 hour(s))  GLUCOSE, CAPILLARY     Status: None   Collection Time    05/07/14  7:51 AM      Result Value Ref Range   Glucose-Capillary 99  70 - 99 mg/dL  GLUCOSE, CAPILLARY     Status: None   Collection Time    05/07/14 11:25 AM      Result Value Ref Range   Glucose-Capillary 82  70 - 99 mg/dL  TROPONIN I     Status: None   Collection Time    05/07/14  2:28 PM      Result Value Ref Range   Troponin I <0.30  <0.30 ng/mL   Comment:            Due to the release kinetics of cTnI,     a negative result within the first hours     of the onset of symptoms does not rule out     myocardial infarction with certainty.     If myocardial infarction is still suspected,     repeat the test at appropriate intervals.  CK TOTAL AND CKMB     Status: Abnormal   Collection Time    05/07/14  3:00 PM      Result Value Ref Range   Total CK 265 (*) 7 - 232 U/L   CK, MB 3.1  0.3 - 4.0 ng/mL   Relative Index 1.2  0.0 - 2.5  GLUCOSE, CAPILLARY     Status: Abnormal   Collection Time    05/07/14  5:00 PM      Result Value Ref Range   Glucose-Capillary 68 (*) 70 - 99 mg/dL  GLUCOSE, CAPILLARY     Status: Abnormal   Collection Time    05/07/14  5:22 PM      Result Value Ref Range   Glucose-Capillary 109 (*) 70 - 99 mg/dL  GLUCOSE, CAPILLARY     Status: Abnormal   Collection Time    05/07/14  8:21 PM      Result Value Ref Range   Glucose-Capillary 143 (*) 70 - 99 mg/dL  TROPONIN I     Status: None   Collection Time    05/07/14  8:28 PM      Result Value Ref Range   Troponin I <0.30  <0.30 ng/mL   Comment:            Due to the release kinetics of cTnI,     a negative result within the first hours  of the onset of symptoms does not rule out     myocardial infarction with certainty.     If myocardial infarction is still suspected,     repeat the test at appropriate intervals.  TROPONIN I     Status: None   Collection Time    05/08/14  5:49 AM      Result Value Ref Range   Troponin I <0.30  <0.30 ng/mL   Comment:            Due to the release kinetics of cTnI,     a negative result within the first hours     of the onset of symptoms does not rule out     myocardial infarction with certainty.     If myocardial infarction is still suspected,     repeat the test at appropriate intervals.  GLUCOSE, CAPILLARY     Status: Abnormal   Collection Time    05/08/14  7:23 AM      Result Value Ref Range   Glucose-Capillary 101 (*) 70 - 99 mg/dL  GLUCOSE, CAPILLARY     Status: Abnormal   Collection Time    05/08/14 11:48 AM      Result Value Ref Range   Glucose-Capillary 67 (*) 70 - 99 mg/dL  GLUCOSE, CAPILLARY     Status: None   Collection Time    05/08/14 12:06 PM      Result Value Ref Range   Glucose-Capillary 99  70 - 99 mg/dL  GLUCOSE, CAPILLARY      Status: None   Collection Time    05/08/14  4:49 PM      Result Value Ref Range   Glucose-Capillary 94  70 - 99 mg/dL  GLUCOSE, CAPILLARY     Status: Abnormal   Collection Time    05/08/14  8:08 PM      Result Value Ref Range   Glucose-Capillary 108 (*) 70 - 99 mg/dL  GLUCOSE, CAPILLARY     Status: Abnormal   Collection Time    05/09/14  7:28 AM      Result Value Ref Range   Glucose-Capillary 109 (*) 70 - 99 mg/dL  GLUCOSE, CAPILLARY     Status: None   Collection Time    05/09/14 11:23 AM      Result Value Ref Range   Glucose-Capillary 76  70 - 99 mg/dL  GLUCOSE, CAPILLARY     Status: None   Collection Time    05/09/14  5:12 PM      Result Value Ref Range   Glucose-Capillary 98  70 - 99 mg/dL  URINALYSIS, ROUTINE W REFLEX MICROSCOPIC     Status: Abnormal   Collection Time    05/09/14  5:57 PM      Result Value Ref Range   Color, Urine YELLOW  YELLOW   APPearance CLOUDY (*) CLEAR   Specific Gravity, Urine 1.024  1.005 - 1.030   pH 5.0  5.0 - 8.0   Glucose, UA NEGATIVE  NEGATIVE mg/dL   Hgb urine dipstick LARGE (*) NEGATIVE   Bilirubin Urine NEGATIVE  NEGATIVE   Ketones, ur NEGATIVE  NEGATIVE mg/dL   Protein, ur 161100 (*) NEGATIVE mg/dL   Urobilinogen, UA 0.2  0.0 - 1.0 mg/dL   Nitrite POSITIVE (*) NEGATIVE   Leukocytes, UA LARGE (*) NEGATIVE  URINE MICROSCOPIC-ADD ON     Status: Abnormal   Collection Time    05/09/14  5:57 PM  Result Value Ref Range   WBC, UA TOO NUMEROUS TO COUNT  <3 WBC/hpf   RBC / HPF 7-10  <3 RBC/hpf   Bacteria, UA MANY (*) RARE  GLUCOSE, CAPILLARY     Status: Abnormal   Collection Time    05/09/14  7:57 PM      Result Value Ref Range   Glucose-Capillary 107 (*) 70 - 99 mg/dL      Head: Normocephalic.  Eyes:  Pupils reactive to light  Neck: Normal range of motion. Neck supple. No thyromegaly present.  Cardiovascular: Normal rate and regular rhythm.  Respiratory: Effort normal and breath sounds normal. No respiratory distress.  GI:  Soft. Bowel sounds are normal. He exhibits no distension.  Neurological:  Flat affect. Morene Antu awareness of deficits  Skin: Skin is warm and dry.  Scar right side of neck  motor strength is 5/5 bilateral deltoid, bicep, tricep, grip  Minimal dysmetria bilaterally finger-nose-finger  Right lower extremity 4/5 hip flexor knee extensor ankle dorsi flexion plantar flexor  Left extremity 4/5 in the left hip flexor knee extensor 3 minus ankle dorsiflexor plantar flexor      Assessment/Plan: 1. Functional deficits secondary to embolic left ACA infarct which require 3+ hours per day of interdisciplinary therapy in a comprehensive inpatient rehab setting. Physiatrist is providing close team supervision and 24 hour management of active medical problems listed below. Physiatrist and rehab team continue to assess barriers to discharge/monitor patient progress toward functional and medical goals.  FIM: FIM - Bathing Bathing Steps Patient Completed: Chest;Left upper leg;Right Arm;Right lower leg (including foot);Left Arm;Left lower leg (including foot);Abdomen;Front perineal area;Buttocks;Right upper leg (showered) Bathing: 6: More than reasonable amount of time  FIM - Upper Body Dressing/Undressing Upper body dressing/undressing steps patient completed: Thread/unthread right sleeve of pullover shirt/dresss;Thread/unthread left sleeve of pullover shirt/dress;Put head through opening of pull over shirt/dress;Pull shirt over trunk Upper body dressing/undressing: 5: Supervision: Safety issues/verbal cues FIM - Lower Body Dressing/Undressing Lower body dressing/undressing steps patient completed: Thread/unthread right pants leg;Thread/unthread left pants leg;Pull pants up/down Lower body dressing/undressing: 6: More than reasonable amount of time  FIM - Toileting Toileting steps completed by patient: Performs perineal hygiene;Adjust clothing prior to toileting;Adjust clothing after toileting Toileting  Assistive Devices: Grab bar or rail for support Toileting: 6: More than reasonable amount of time  FIM - Diplomatic Services operational officer Devices: [employer] Transfers: 4-To toilet/BSC: Min A (steadying Pt. > 75%);4-From toilet/BSC: Min A (steadying Pt. > 75%)  FIM - Banker Devices: Cane;Arm rests Bed/Chair Transfer: 4: Bed > Chair or W/C: Min A (steadying Pt. > 75%);4: Chair or W/C > Bed: Min A (steadying Pt. > 75%)  FIM - Locomotion: Wheelchair Distance: 150 Locomotion: Wheelchair: 2: Travels 50 - 149 ft with minimal assistance (Pt.>75%) FIM - Locomotion: Ambulation Locomotion: Ambulation Assistive Devices: Emergency planning/management officer Ambulation/Gait Assistance: 4: Min assist Locomotion: Ambulation: 4: Travels 150 ft or more with minimal assistance (Pt.>75%) (165)  Comprehension Comprehension Mode: Auditory Comprehension: 5-Understands basic 90% of the time/requires cueing < 10% of the time  Expression Expression Mode: Verbal Expression: 5-Expresses basic 90% of the time/requires cueing < 10% of the time.  Social Interaction Social Interaction: 4-Interacts appropriately 75 - 89% of the time - Needs redirection for appropriate language or to initiate interaction.  Problem Solving Problem Solving: 3-Solves basic 50 - 74% of the time/requires cueing 25 - 49% of the time  Memory Memory: 2-Recognizes or recalls 25 - 49% of the time/requires  cueing 51 - 75% of the time Medical Problem List and Plan:  1. Functional deficits secondary to left ACA distribution infarct suspect embolic secondary to unclear source  2. DVT Prophylaxis/Anticoagulation: Subcutaneous Lovenox. Monitor platelet counts and any signs of bleeding  3. Pain Management: Tylenol as needed  4. Hypertension. Cardizem 360 mg daily, Cardura 2 mg daily, Lopressor 50 mg twice a day. Monitor with increased mobility BP on low side this am write parameters 5. Neuropsych: This patient  is not yet capable of making decisions on his own behalf. Cog status is improving, aware of therapy schedule 6. Diabetes mellitus with peripheral neuropathy. Hemoglobin A1c 5.7. Glipizide 5 mg daily. Check blood sugars a.c. and at bedtime. Provide diabetic teaching.  7. Hyperlipidemia. Lipitor  8. CAD with stenting. Cardiac enzymes negative. Continue aspirin Plavix therapy. No chest pain or shortness of breath.  9. History of BPH. Check PVRs x3 10.  Left foot drop- pt gives hx of back problem causing Left foot weakness but also has had prior Right parietal CVA,  PT note states AFO made Ext rotation and leg whip worse 11. UTI-start keflex  LOS (Days) 12 A FACE TO FACE EVALUATION WAS PERFORMED  Erick Colace 05/10/2014, 7:08 AM

## 2014-05-10 NOTE — Progress Notes (Signed)
Occupational Therapy Session Note  Patient Details  Name: Nicholas [name] MRN: [id number] Date of Birth: 07-Sep-1951  Today's Date: 05/10/2014 Time: 0900-1000 Time Calculation (min): 60 min  Short Term Goals: Week 1:  OT Short Term Goal 1 (Week 1): Pt will transfer to toilet from w/c with mod A x1 using grab bars. OT Short Term Goal 1 - Progress (Week 1): Met OT Short Term Goal 2 (Week 1): Pt will don a shirt with verbal cues only. OT Short Term Goal 2 - Progress (Week 1): Met OT Short Term Goal 3 (Week 1): Pt will don pants over feet with min A and pull over hips with mod A. OT Short Term Goal 3 - Progress (Week 1): Met OT Short Term Goal 4 (Week 1): Pt will stand from EOB and stand with RW with min A. OT Short Term Goal 4 - Progress (Week 1): Met OT Short Term Goal 5 (Week 1): Pt will bathe with min A. OT Short Term Goal 5 - Progress (Week 1): Met Week 2:  OT Short Term Goal 1 (Week 2): Pt will be able to make a simple sandwich with min A. OT Short Term Goal 2 (Week 2): Pt will be able to ambulate to toilet with RW with supervision.  OT Short Term Goal 3 (Week 2): Pt will be able to accurately state if he needs to use the restroom or if he has had an accident with min cues.      Skilled Therapeutic Interventions/Progress Updates:    Pt seen for ADL retraining of B/D with a focus on dynamic balance and functional mobility with a cane. Pt was able to walk in and out of shower with steadying assist and he completed his self care with supervision. Several times during the session, attempted to engage pt in conversation while he was working to expand his divided attention skills, but pt not able to. He would stop talking until he finished what he was doing.  Pt was then taken to ADL apartment to work on kitchen mobility skills of reaching into low cupboard, transferring heavy pot of water, finding items in cabinets. Pt was able to quickly find items and found the appropriate pot he would need  to make pasta. Reviewed 2 ways he could transfer the hot water to a colander in the sink safely.  Pt will need A with meal prep at home, but he is encouraged to engage in these tasks.  Pt returned to his room with call light and phone in reach.  Therapy Documentation Precautions:  Precautions Precautions: Fall Precaution Comments: Pt has difficulty with reading (education not stroke related); will have difficulty with written materials Restrictions Weight Bearing Restrictions: No       Pain: Pain Assessment Pain Assessment: No/denies pain Pain Score: 0-No pain ADL: ADL ADL Comments: Refer to FIM  See FIM for current functional status  Therapy/Group: Individual Therapy  Harlene Ramus 05/10/2014, 11:34 AM

## 2014-05-11 ENCOUNTER — Inpatient Hospital Stay (HOSPITAL_COMMUNITY): Payer: Medicare Other | Admitting: Speech Pathology

## 2014-05-11 ENCOUNTER — Inpatient Hospital Stay (HOSPITAL_COMMUNITY): Payer: Medicare Other | Admitting: *Deleted

## 2014-05-11 ENCOUNTER — Inpatient Hospital Stay (HOSPITAL_COMMUNITY): Payer: Medicare Other | Admitting: Occupational Therapy

## 2014-05-11 DIAGNOSIS — I69919 Unspecified symptoms and signs involving cognitive functions following unspecified cerebrovascular disease: Secondary | ICD-10-CM

## 2014-05-11 DIAGNOSIS — I6340 Cerebral infarction due to embolism of unspecified cerebral artery: Secondary | ICD-10-CM

## 2014-05-11 DIAGNOSIS — I10 Essential (primary) hypertension: Secondary | ICD-10-CM

## 2014-05-11 DIAGNOSIS — E1149 Type 2 diabetes mellitus with other diabetic neurological complication: Secondary | ICD-10-CM

## 2014-05-11 DIAGNOSIS — I2510 Atherosclerotic heart disease of native coronary artery without angina pectoris: Secondary | ICD-10-CM

## 2014-05-11 LAB — GLUCOSE, CAPILLARY
GLUCOSE-CAPILLARY: 170 mg/dL — AB (ref 70–99)
Glucose-Capillary: 86 mg/dL (ref 70–99)
Glucose-Capillary: 82 mg/dL (ref 70–99)
Glucose-Capillary: 96 mg/dL (ref 70–99)

## 2014-05-11 NOTE — Progress Notes (Signed)
Occupational Therapy Session Note  Patient Details  Name: Nicholas [name] MRN: [id number] Date of Birth: 1951-09-24  Today's Date: 05/11/2014 Time: 0900-1000 Time Calculation (min): 60 min  Short Term Goals: Week 1:  OT Short Term Goal 1 (Week 1): Pt will transfer to toilet from w/c with mod A x1 using grab bars. OT Short Term Goal 1 - Progress (Week 1): Met OT Short Term Goal 2 (Week 1): Pt will don a shirt with verbal cues only. OT Short Term Goal 2 - Progress (Week 1): Met OT Short Term Goal 3 (Week 1): Pt will don pants over feet with min A and pull over hips with mod A. OT Short Term Goal 3 - Progress (Week 1): Met OT Short Term Goal 4 (Week 1): Pt will stand from EOB and stand with RW with min A. OT Short Term Goal 4 - Progress (Week 1): Met OT Short Term Goal 5 (Week 1): Pt will bathe with min A. OT Short Term Goal 5 - Progress (Week 1): Met Week 2:  OT Short Term Goal 1 (Week 2): Pt will be able to make a simple sandwich with min A. OT Short Term Goal 2 (Week 2): Pt will be able to ambulate to toilet with RW with supervision.  OT Short Term Goal 3 (Week 2): Pt will be able to accurately state if he needs to use the restroom or if he has had an accident with min cues.  Skilled Therapeutic Interventions/Progress Updates:    Pt seen for ADL retraining this morning with a focus on dynamic balance. Upon entering the room, pt was standing at his closet removing his clothing for the day from the hangers. Pt was reminded that he should not stand by himself. He laughed and said "I do this all the time!"  So the focus today was to determine if pt can truly move around safely. He was much steadier with his ambulation and did not need any cuing today. He initiated every step of the process of his self care.  He only needed S to walk to the toilet and then to the shower with his cane. He then completed his self care with mod I.  Pt did not get fatigued.  He was anxious to be fully prepared for  his outing today.  Discussed with pt that he is getting more independent and his LTGs will be upgraded, but he should still have supervision with his mobility in his room. Pt stated that he understood.  Pt resting in w/c in his room with call light in reach.    Therapy Documentation Precautions:  Precautions Precautions: Fall Precaution Comments: Pt has difficulty with reading (education not stroke related); will have difficulty with written materials Restrictions Weight Bearing Restrictions: No      Pain: Pain Assessment Pain Assessment: No/denies pain Pain Score: 0-No pain ADL: ADL ADL Comments: Refer to FIM  See FIM for current functional status  Therapy/Group: Individual Therapy  Geoffery Lyons Freemon Binford 05/11/2014, 12:04 PM

## 2014-05-11 NOTE — Progress Notes (Signed)
Recreational Therapy Assessment and Plan  Patient Details  Name: Nicholas [name] MRN: [id number] Date of Birth: 09-29-1951 Today's Date: 05/11/2014  Rehab Potential: Good  ELOS: 10 days  Assessment Clinical Impression: Problem List:  Patient Active Problem List    Diagnosis  Date Noted   .  CVA (cerebral infarction)  04/28/2014   .  Stroke  04/26/2014   .  Acute encephalopathy  04/26/2014   .  Abdominal pain  04/26/2014   .  PSVT  09/15/2010   .  AORTIC INSUFFICIENCY  08/09/2010   .  CLAUDICATION  08/09/2010   .  PALPITATIONS  08/09/2010   .  PALPITATIONS, HX OF  08/09/2010   .  HYPERLIPIDEMIA-MIXED  08/08/2010   .  TOBACCO ABUSE  08/08/2010   .  HYPERTENSION, UNSPECIFIED  08/08/2010   .  CAD, NATIVE VESSEL  08/08/2010   .  CAROTID ARTERY DISEASE  08/08/2010    Past Medical History:  Past Medical History   Diagnosis  Date   .  Hypertension    .  Hyperlipidemia    .  Type 2 diabetes mellitus    .  History of myocardial infarction      1993-- NON-Q WAVE S/P PCI TO LAD   .  History of CVA (cerebrovascular accident)      1993--- NO RESIDUALS   .  Hydrocele, left    .  BPH (benign prostatic hypertrophy)      HX RETENTION   .  PSVT (paroxysmal supraventricular tachycardia)    .  Coronary artery disease  CARDIOLOGIST-- DR Rayann Heman     S/P PCI TO LAD 1993   .  Carotid artery occlusion      S/P RIGHT CEA   .  PVD (peripheral vascular disease)      S/P LEFT FEM-POP   .  Left carotid artery stenosis      MILD   .  History of head injury      CONCUSSION-- NO RESIDUAL   .  Right leg weakness      USES CANE-- SECONDARY TO PVD   .  At high risk for falls    .  Wears glasses    .  Myocardial infarction    .  GERD (gastroesophageal reflux disease)     Past Surgical History:  Past Surgical History   Procedure  Laterality  Date   .  Carotid endarterectomy  Right  05-31-2003   .  Femoral-popliteal bypass graft  Left  01-19-2003   .  Right hydrocelectomy   05-31-2003   .   Coronary angioplasty   1993     PCI TO LAD   .  Cardiac catheterization   01-18-2003 DR NISHAN     NON-OBSTRUCTIVE CAD/ PREVIOIS ANGIOPLASTY SITE WIDELY PATENT   .  Cardiovascular stress test   2007     SMALL ANTERO-APICAL SCAR/ NO ISCHEMIA   .  Transthoracic echocardiogram   08-24-2010     EF 55%/ MILD AORTIC INSUFFICENCY/ MODERATE LVH   .  Hydrocele excision  Left  10/27/2013     Procedure: HYDROCELECTOMY ADULT; Surgeon: Hanley Ben, MD; Location: [facility]; Service: Urology; Laterality: Left;    Assessment & Plan  Clinical Impression: 63 y.o.right handed male with history of hypertension, diabetes mellitus peripheral neuropathy, CVA 1993 with left leg weakness,CAD with stenting. Patient lives alone independent with a cane prior to admission. Admitted 04/26/2014 and altered mental status. MRI with acute nonhemorrhagic left ACA  territory infarct as well as remote right parietal lobe infarct. MRA of the head with occlusion of the internal carotid artery with reconstitution at the level the posterior to indicating artery.echo with EF 55% without embolism.Carotid doppler without ICA stenosis.Cardiac enzymes negative. Patient transferred to CIR on 04/28/2014.   Pt presents with decreased activity tolerance, decreased functional mobility, decreased balance, decreased safety, decreased problem solving, decreased memory & delayed processing Limiting pt's independence with leisure/community pursuits.  Plan Min 1 time per week >20 mintues  Recommendations for other services: Neuropsych  Discharge Criteria: Patient will be discharged from TR if patient refuses treatment 3 consecutive times without medical reason.  If treatment goals not met, if there is a change in medical status, if patient makes no progress towards goals or if patient is discharged from hospital.  The above assessment, treatment plan, treatment alternatives and goals were discussed and mutually agreed upon: by  patient  Waldon Reining 05/11/2014, 4:24 PM

## 2014-05-11 NOTE — Progress Notes (Signed)
Speech Language Pathology Daily Session Note  Patient Details  Name: Nicholas [name] MRN: [id number] Date of Birth: 05/21/1951  Today's Date: 05/11/2014 Time: 1735-6701 Time Calculation (min): 45 min  Short Term Goals: Week 2: SLP Short Term Goal 1 (Week 2): Patient will demonstrate basic problem solving during self-care tasks with Min verbal cues SLP Short Term Goal 2 (Week 2): Patient will sustain attention to basic self-care tasks for 10 minutes with Min cues SLP Short Term Goal 3 (Week 2): Patient will label 3 deficits with Mod question cues SLP Short Term Goal 4 (Week 2): Patient will follow multi-step commands during functional tasks with Min vebral cues  Skilled Therapeutic Interventions: Pt was seen for skilled speech therapy targeting executive function for financial management.  Pt required max-total assist to count change during a structured task and benefited from written visual aids due to working memory and thought organization impairments.  Pt was noted with marked improvements when asked to generate a given amount with mixed coins and was 75% accuracy with mod assist, improving to 100% accuracy with max assist.  Pt was noted with improvements of insight during the aforementioned task and stated "I used to be good at this before, now I just can't do it."  SLP reinforced current goals and progress in therapy as well as sequela of stroke recovery.  Continue per current plan of care.    FIM:  Expression Expression Mode: Verbal Expression: 5-Expresses basic 90% of the time/requires cueing < 10% of the time. Social Interaction Social Interaction: 4-Interacts appropriately 75 - 89% of the time - Needs redirection for appropriate language or to initiate interaction. Problem Solving Problem Solving: 4-Solves basic 75 - 89% of the time/requires cueing 10 - 24% of the time Memory Memory: 3-Recognizes or recalls 50 - 74% of the time/requires cueing 25 - 49% of the time  Pain Pain  Assessment Pain Assessment: No/denies pain  Therapy/Group: Individual Therapy  Jackalyn Lombard, M.A. CCC-SLP  [name] 05/11/2014, 4:08 PM

## 2014-05-11 NOTE — Consult Note (Signed)
  INITIAL DIAGNOSTIC EVALUATION - [facility]   MEDICAL NECESSITY:  Nicholas [name] was seen on the [facility] for follow-up owing to the patient's diagnosis of cerebral infarction.   According to medical records, the patient was seen by my colleague, Dr. Wylene Simmer, on 05/06/14. Results from that assessment were as follows:   Nicholas [name] has sustained two strokes in his history, which could cause significant cognitive disruption, though we were unable to determine the exact nature and extent of potential cognitive deficits, given his questionable approach to testing. Owing to his approach to testing today, we cannot rule out behavioral causes for his urinary incontinence. From an emotional standpoint, Nicholas [name] endorsed symptoms consistent with clinically significant depressed mood, likely at a moderate level. Depressed mood could be contributing to cognitive weaknesses and could explain his poor task engagement on today's testing. He would likely benefit from participation in individual psychotherapy post-discharge and may benefit from engaging in brief supportive therapy while on this unit. This should be scheduled with Dr. Jacquelyne Balint for next week.    During today's visit, Nicholas [name] denied suffering from any major cognitive issues. He believes that he did well on testing last week with Dr. Wylene Simmer. He admitted to feeling somewhat "lost" regarding his recent stroke. He expected to be making more gains by now. He is also more frustrated since he was just diagnosed with a bladder infection. I spent a great deal of time normalizing his feeling and providing stroke education. This seemed to be comforting to a degree. Suicidal/homicidal ideation, plan or intent was denied. No manic or hypomanic episodes were reported. The patient denied ever experiencing any auditory/visual hallucinations. No major behavioral or personality changes were  endorsed.   Nicholas [name] reported having a good social support system and said that staff has been treating him well. He is purportedly ready to discharge and get back to performing previously enjoyed activities.   PROCEDURES ADMINISTERED: [1 unit [id number] on 05/10/14] Diagnostic clinical interview  Review of available records  Behavioral Observations: Nicholas [name] was appropriately dressed for season and situation. Normal posture was noted. He was friendly and rapport was adequately established. His speech was as expected and he was able to express ideas effectively. His affect was somewhat flat. Attention and motivation were adequate. Optimal test taking conditions were maintained.    Overall, Nicholas [name] denied suffering from any major cognitive or emotional symptoms beyond what would be considered a normal reaction to his present medication situation. This is in juxtaposition to what Dr. Wylene Simmer noted last week but it is consistent with the patient's variable presentation. As mentioned, I spent a great deal of time normalizing his feels and providing stroke education. This seemed to be helpful. I offered to see him again next week for brief supportive counseling (if he was still on the unit) but he declined saying that he was "doing fine." He agreed to call upon Dr. Wylene Simmer or myself if he required services.     DIAGNOSIS:  Cerebral infarction   Debbe Mounts, Psy.D.  Clinical Neuropsychologist

## 2014-05-11 NOTE — Progress Notes (Signed)
Recreational Therapy Session Note  Patient Details  Name: Nicholas [name] MRN: [id number] Date of Birth: 07/06/1951 Today's Date: 05/11/2014  Pain: no c/o Skilled Therapeutic Interventions/Progress Updates: Pt participated in community reintegration to [facility] at supervision ambulatory level.  Session focused on activity tolerance, functional mobility within the community, & overall safety.  See outing goal sheet in shadow chart for full details.  Therapy/Group: [employer]   Clois Dupes 05/11/2014, 4:29 PM

## 2014-05-11 NOTE — Progress Notes (Signed)
Patient ID: Nicholas [name], male   DOB: Apr 22, 1951, 63 y.o.   MRN: [id number] 63 y.o.right handed male with history of hypertension, diabetes mellitus peripheral neuropathy, CVA 1993 with left leg weakness,CAD with stenting. Patient lives alone independent with a cane prior to admission. Admitted 04/26/2014 and altered mental status.  MRI with acute nonhemorrhagic left ACA territory infarct as well as remote right parietal lobe infarct. 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  Subjective/Complaints: Frequency with urination, some burning, no sweats or chills Review of Systems - unable to obtain secondary to mental status  Objective: Vital Signs: Blood pressure 117/67, pulse 70, temperature 98.2 F (36.8 C), temperature source Oral, resp. rate 18, height 6' 0.05" (1.83 m), weight 107.684 kg (237 lb 6.4 oz), SpO2 100.00%. No results found. Results for orders placed during the hospital encounter of 04/28/14 (from the past 72 hour(s))  GLUCOSE, CAPILLARY     Status: Abnormal   Collection Time    05/08/14  7:23 AM      Result Value Ref Range   Glucose-Capillary 101 (*) 70 - 99 mg/dL  GLUCOSE, CAPILLARY     Status: Abnormal   Collection Time    05/08/14 11:48 AM      Result Value Ref Range   Glucose-Capillary 67 (*) 70 - 99 mg/dL  GLUCOSE, CAPILLARY     Status: None   Collection Time    05/08/14 12:06 PM      Result Value Ref Range   Glucose-Capillary 99  70 - 99 mg/dL  GLUCOSE, CAPILLARY     Status: None   Collection Time    05/08/14  4:49 PM      Result Value Ref Range   Glucose-Capillary 94  70 - 99 mg/dL  GLUCOSE, CAPILLARY     Status: Abnormal   Collection Time    05/08/14  8:08 PM      Result  Value Ref Range   Glucose-Capillary 108 (*) 70 - 99 mg/dL  GLUCOSE, CAPILLARY     Status: Abnormal   Collection Time    05/09/14  7:28 AM      Result Value Ref Range   Glucose-Capillary 109 (*) 70 - 99 mg/dL  GLUCOSE, CAPILLARY     Status: None   Collection Time    05/09/14 11:23 AM      Result Value Ref Range   Glucose-Capillary 76  70 - 99 mg/dL  GLUCOSE, CAPILLARY     Status: None   Collection Time    05/09/14  5:12 PM      Result Value Ref Range   Glucose-Capillary 98  70 - 99 mg/dL  URINALYSIS, ROUTINE W REFLEX MICROSCOPIC     Status: Abnormal   Collection Time    05/09/14  5:57 PM      Result Value Ref Range   Color, Urine YELLOW  YELLOW   APPearance CLOUDY (*) CLEAR   Specific Gravity, Urine 1.024  1.005 - 1.030   pH 5.0  5.0 - 8.0   Glucose, UA NEGATIVE  NEGATIVE mg/dL   Hgb urine dipstick LARGE (*) NEGATIVE   Bilirubin Urine NEGATIVE  NEGATIVE   Ketones, ur NEGATIVE  NEGATIVE mg/dL   Protein, ur 790 (*) NEGATIVE mg/dL   Urobilinogen, UA 0.2  0.0 -  1.0 mg/dL   Nitrite POSITIVE (*) NEGATIVE   Leukocytes, UA LARGE (*) NEGATIVE  URINE MICROSCOPIC-ADD ON     Status: Abnormal   Collection Time    05/09/14  5:57 PM      Result Value Ref Range   WBC, UA TOO NUMEROUS TO COUNT  <3 WBC/hpf   RBC / HPF 7-10  <3 RBC/hpf   Bacteria, UA MANY (*) RARE  URINE CULTURE     Status: None   Collection Time    05/09/14  5:58 PM      Result Value Ref Range   Specimen Description URINE, CLEAN CATCH     Special Requests none     Culture  Setup Time       Value: 05/09/2014 18:16     Performed at [facility]   Colony Count       Value: 75,000 COLONIES/ML     Performed at [facility]   Culture       Value: STAPHYLOCOCCUS AUREUS     Note: RIFAMPIN AND GENTAMICIN SHOULD NOT BE USED AS SINGLE DRUGS FOR TREATMENT OF STAPH INFECTIONS.     Performed at [facility]   Report Status PENDING    GLUCOSE, CAPILLARY     Status: Abnormal   Collection Time     05/09/14  7:57 PM      Result Value Ref Range   Glucose-Capillary 107 (*) 70 - 99 mg/dL  GLUCOSE, CAPILLARY     Status: None   Collection Time    05/10/14  7:17 AM      Result Value Ref Range   Glucose-Capillary 89  70 - 99 mg/dL  GLUCOSE, CAPILLARY     Status: None   Collection Time    05/10/14 11:25 AM      Result Value Ref Range   Glucose-Capillary 79  70 - 99 mg/dL  GLUCOSE, CAPILLARY     Status: None   Collection Time    05/10/14  4:31 PM      Result Value Ref Range   Glucose-Capillary 98  70 - 99 mg/dL  GLUCOSE, CAPILLARY     Status: Abnormal   Collection Time    05/10/14  8:49 PM      Result Value Ref Range   Glucose-Capillary 191 (*) 70 - 99 mg/dL      Head: Normocephalic.  Eyes:  Pupils reactive to light  Neck: Normal range of motion. Neck supple. No thyromegaly present.  Cardiovascular: Normal rate and regular rhythm.  Respiratory: Effort normal and breath sounds normal. No respiratory distress.  GI: Soft. Bowel sounds are normal. He exhibits no distension.  Neurological:  Flat affect. Morene Antu.Fair awareness of deficits  Skin: Skin is warm and dry.  Scar right side of neck  motor strength is 5/5 bilateral deltoid, bicep, tricep, grip  Minimal dysmetria bilaterally finger-nose-finger  Right lower extremity 4/5 hip flexor knee extensor ankle dorsi flexion plantar flexor  Left extremity 4/5 in the left hip flexor knee extensor 3 minus ankle dorsiflexor plantar flexor      Assessment/Plan: 1. Functional deficits secondary to embolic left ACA infarct which require 3+ hours per day of interdisciplinary therapy in a comprehensive inpatient rehab setting. Physiatrist is providing close team supervision and 24 hour management of active medical problems listed below. Physiatrist and rehab team continue to assess barriers to discharge/monitor patient progress toward functional and medical goals.  FIM: FIM - Bathing Bathing Steps Patient Completed: Chest;Left upper  leg;Right Arm;Right  lower leg (including foot);Left Arm;Left lower leg (including foot);Abdomen;Front perineal area;Buttocks;Right upper leg Bathing: 5: Supervision: Safety issues/verbal cues  FIM - Upper Body Dressing/Undressing Upper body dressing/undressing steps patient completed: Thread/unthread right sleeve of pullover shirt/dresss;Thread/unthread left sleeve of pullover shirt/dress;Put head through opening of pull over shirt/dress;Pull shirt over trunk Upper body dressing/undressing: 6: More than reasonable amount of time FIM - Lower Body Dressing/Undressing Lower body dressing/undressing steps patient completed: Thread/unthread right pants leg;Thread/unthread left pants leg;Pull pants up/down Lower body dressing/undressing: 5: Supervision: Safety issues/verbal cues  FIM - Toileting Toileting steps completed by patient: Adjust clothing prior to toileting;Performs perineal hygiene;Adjust clothing after toileting Toileting Assistive Devices: Grab bar or rail for support Toileting: 6: More than reasonable amount of time  FIM - Diplomatic Services operational officer Devices: Cane;Grab bars Toilet Transfers: 5-From toilet/BSC: Supervision (verbal cues/safety issues)  FIM - Banker Devices: Teacher, music: 4: Bed > Chair or W/C: Min A (steadying Pt. > 75%);4: Chair or W/C > Bed: Min A (steadying Pt. > 75%)  FIM - Locomotion: Wheelchair Distance: 150 Locomotion: Wheelchair: 5: Travels 150 ft or more: maneuvers on rugs and over door sills with supervision, cueing or coaxing FIM - Locomotion: Ambulation Locomotion: Ambulation Assistive Devices: Emergency planning/management officer Ambulation/Gait Assistance: 4: Min assist Locomotion: Ambulation: 4: Travels 150 ft or more with minimal assistance (Pt.>75%)  Comprehension Comprehension Mode: Auditory Comprehension: 5-Understands complex 90% of the time/Cues < 10% of the time  Expression Expression Mode:  Verbal Expression: 5-Expresses basic 90% of the time/requires cueing < 10% of the time.  Social Interaction Social Interaction: 4-Interacts appropriately 75 - 89% of the time - Needs redirection for appropriate language or to initiate interaction.  Problem Solving Problem Solving: 3-Solves basic 50 - 74% of the time/requires cueing 25 - 49% of the time  Memory Memory: 2-Recognizes or recalls 25 - 49% of the time/requires cueing 51 - 75% of the time Medical Problem List and Plan:  1. Functional deficits secondary to left ACA distribution infarct suspect embolic secondary to unclear source  2. DVT Prophylaxis/Anticoagulation: Subcutaneous Lovenox. Monitor platelet counts and any signs of bleeding  3. Pain Management: Tylenol as needed  4. Hypertension. Cardizem 360 mg daily, Cardura 2 mg daily, Lopressor 50 mg twice a day. Monitor with increased mobility BP on low side this am write parameters 5. Neuropsych: This patient is not yet capable of making decisions on his own behalf. Cog status is improving, aware of therapy schedule 6. Diabetes mellitus with peripheral neuropathy. Hemoglobin A1c 5.7. Glipizide 5 mg daily. Check blood sugars a.c. and at bedtime. Provide diabetic teaching.  7. Hyperlipidemia. Lipitor  8. CAD with stenting. Cardiac enzymes negative. Continue aspirin Plavix therapy. No chest pain or shortness of breath.  9. History of BPH. Check PVRs x3 10.  Left foot drop- pt gives hx of back problem causing Left foot weakness but also has had prior Right parietal CVA,  PT note states AFO made Ext rotation and leg whip worse 11. UTI-start keflex pending culture  LOS (Days) 13 A FACE TO FACE EVALUATION WAS PERFORMED  Erick Colace 05/11/2014, 6:53 AM

## 2014-05-11 NOTE — Progress Notes (Addendum)
Physical Therapy Session Note  Patient Details  Name: Nicholas [name] MRN: [id number] Date of Birth: 06-06-1951  Today's Date: 05/11/2014 Time: 8:00-9:00 ( ) and 10:30-12:00 ( )   Short Term Goals: Week 2:  PT Short Term Goal 1 (Week 2): pt will perform basic transfer consistently with supervision, 1 or less cue for technique PT Short Term Goal 2 (Week 2): pt will propel w/c to/from therapy with 1 cue to initiate, distant supervision PT Short Term Goal 3 (Week 2): pt will perform gait x 100' with min assist including turns PT Short Term Goal 4 (Week 2): pt will ascend/descend 5 steps 2 rails with supervision, 1 or less cue for technique  Skilled Therapeutic Interventions/Progress Updates:  First tx focused on functional mobiltiy in room, dynamic gait with SPC, and floor transfer   Pt up in WC, with diaper wet, but also having just used urinal.   Pt performed navigation in room with [facility] and min-guard/S assist for gathering clothing, cleaning self, and dressing. Pt able to don AFO with S cues to don sock as well. Pt performed HS and heel cord self-stretching with LLE on stool x2 min with strap, needing min A for set-up after spending trying to loop belt around LE independently.   Performed gait training on unit 1x175' with close S and SPC with cues for posture and equal step length.  Challenged gait with obstacles courses with SPC and min A needed for 4"step overs. Pt performed cone weaving and walking on compliant surfaces with close S, and demonstrated increased shuffling during changes in direction.   Instructed pt in floor transfers, including safety checks and technique. Pt able to return demo with S cues only for efficiency.  Pt left up in [facility], not needing to urinate.   ---------------------  Second tx focused on community outing. Pt performed all gait and mobility at S level overall, but was too fatigued after walking >500' to safely perform curb per his request. Will  reattempt this at another time. See shadow chart for outing notes.       Therapy Documentation Precautions:  Precautions Precautions: Fall Precaution Comments: Pt has difficulty with reading (education not stroke related); will have difficulty with written materials Restrictions Weight Bearing Restrictions: No    Vital Signs: Therapy Vitals Temp: 98.2 F (36.8 C) Temp src: Oral Pulse Rate: 67 Resp: 18 BP: 121/69 mmHg Patient Position (if appropriate): Lying Oxygen Therapy SpO2: 100 % O2 Device: None (Room air) Pain: none either tx    See FIM for current functional status  Therapy/Group: Individual Therapy Clydene Laming, PT, DPT   05/11/2014, 8:23 AM

## 2014-05-12 ENCOUNTER — Inpatient Hospital Stay (HOSPITAL_COMMUNITY): Payer: Medicare Other

## 2014-05-12 ENCOUNTER — Inpatient Hospital Stay (HOSPITAL_COMMUNITY): Payer: Medicare Other | Admitting: Occupational Therapy

## 2014-05-12 ENCOUNTER — Inpatient Hospital Stay (HOSPITAL_COMMUNITY): Payer: Medicare Other | Admitting: *Deleted

## 2014-05-12 ENCOUNTER — Inpatient Hospital Stay (HOSPITAL_COMMUNITY): Payer: Medicare Other | Admitting: Speech Pathology

## 2014-05-12 LAB — CREATININE, SERUM
CREATININE: 1.18 mg/dL (ref 0.50–1.35)
GFR calc Af Amer: 75 mL/min — ABNORMAL LOW (ref >90)
GFR, EST NON AFRICAN AMERICAN: 64 mL/min — AB (ref >90)

## 2014-05-12 LAB — URINE CULTURE: Colony Count: 75000

## 2014-05-12 LAB — GLUCOSE, CAPILLARY
GLUCOSE-CAPILLARY: 87 mg/dL (ref 70–99)
Glucose-Capillary: 84 mg/dL (ref 70–99)
Glucose-Capillary: 90 mg/dL (ref 70–99)

## 2014-05-12 MED ORDER — METHYLPHENIDATE HCL 5 MG PO TABS
10.0000 mg | ORAL_TABLET | Freq: Two times a day (BID) | ORAL | Status: DC
  Administered 2014-05-12 – 2014-05-15 (×6): 10 mg via ORAL
  Filled 2014-05-12 (×6): qty 2

## 2014-05-12 MED ORDER — TRAZODONE HCL 50 MG PO TABS
50.0000 mg | ORAL_TABLET | Freq: Every day | ORAL | Status: AC
  Administered 2014-05-12: 50 mg via ORAL
  Filled 2014-05-12: qty 1

## 2014-05-12 NOTE — Progress Notes (Signed)
Social Work Patient ID: Nicholas [name], male   DOB: 1951/06/03, 63 y.o.   MRN: [id number] Met with pt and left message for daughter to inform of team conference progressing toward his goals and will reach sooner than 6/10.  Moved  Up discharge date to 6/6 after family education on Sat.  Have asked daughter to contact me regarding the time to schedule education on Sat.   Will order DME and follow up once speak with daughter.  Pt aware and will inform if see's tonight.

## 2014-05-12 NOTE — Patient Care Conference (Signed)
Inpatient RehabilitationTeam Conference and Plan of Care Update Date: 05/12/2014   Time: 10:45 AM    Patient Name: Nicholas [name]      Medical Record Number: [id number]  Date of Birth: 01/22/1951 Sex: Male         Room/Bed: 4W21C/4W21C-01 Payor Info: Payor: MEDICARE / Plan: MEDICARE PART A AND B / Product Type: *No Product type* /    Admitting Diagnosis: L ACA INFARCT  Admit Date/Time:  04/28/2014  5:07 PM Admission Comments: No comment available   Primary Diagnosis:  <principal problem not specified> Principal Problem: <principal problem not specified>  Patient Active Problem List   Diagnosis Date Noted  . CVA (cerebral infarction) 04/28/2014  . Stroke 04/26/2014  . Acute encephalopathy 04/26/2014  . PSVT 09/15/2010  . AORTIC INSUFFICIENCY 08/09/2010  . CLAUDICATION 08/09/2010  . PALPITATIONS 08/09/2010  . HYPERLIPIDEMIA-MIXED 08/08/2010  . TOBACCO ABUSE 08/08/2010  . HYPERTENSION, UNSPECIFIED 08/08/2010  . CAD, NATIVE VESSEL 08/08/2010  . CAROTID ARTERY DISEASE 08/08/2010    Expected Discharge Date: Expected Discharge Date: 05/15/14  Team Members Present: Physician leading conference: Dr. Claudette LawsAndrew Kirsteins Social Worker Present: Dossie DerBecky Umer Harig, LCSW Nurse Present: Carlean PurlMaryann Barbour, RN PT Present: Edman CircleAudra Hall, PT;Blair Hobble, Lillie ColumbiaPT;Caroline Cook, PT OT Present: Bretta BangKris Gellert, OT SLP Present: Other (comment) Joni Reining(Nicole Page-SPT) PPS Coordinator present : Edson SnowballBecky Windsor, Chapman FitchPT;Marie Noel, RN, CRRN     Current Status/Progress Goal Weekly Team Focus  Medical   neuropsych no mood disturbance, poor balance, initiation  Improve balance and safety   family to bring shoes   Bowel/Bladder   Continent of bowel and bladder. LBM 05/11/2014. Incontinent at times.  Time toileting q 3 hours  Monitor any signs and symptoms of diarrhea/constipation.   Swallow/Nutrition/ Hydration   regular, thin liquids with intermittent supervision   mod I with least restrictive diet   diet tolerance    ADL's   mod I with bathing, dressing, toileting; supervision transfers  goals upgraded - mod I overall, supervision light meal prep and housekeeping  ADLs dynamic balance, pt/family education   Mobility   Min/S transfers, S bed mobility, S/min-guard gait with RW x150', min A stairs  S transfers, Mod I bed mobitliy, S gait x150' with LRAD (use cane PTA)   Community integration, dynamic gait and balance, curb steps, safety and family training as able.    Communication   min assist for initiation of functional communication   supervision  cognition    Safety/Cognition/ Behavioral Observations  mod assist for executive function and memory  min assist-supervision   carryover of compensatory strategies    Pain   n/a  Pain level 3 or less on a scale of 0-10.  Monitor any onset of pain.   Skin   Buttocks reddened. Barrier cream applied.  No new skin breakdown/infection  Monitor skin q shift.      *See Care Plan and progress notes for long and short-term goals.  Barriers to Discharge: family not in for education    Possible Resolutions to Barriers:  fam ed on Sat    Discharge Planning/Teaching Needs:  Home with daughter and son in-law to provide 24 hr care-have yet to have daughter call me back and will try to get in for family education      Team Discussion:  Reaching goals sooner than 6/10,can move up to Sat after family education.  Working on continence issues and increasing ritalin.  Experienced dizziness today.  Revisions to Treatment Plan:  Moved up discharge to 6/6  Continued Need for Acute Rehabilitation Level of Care: The patient requires daily medical management by a physician with specialized training in physical medicine and rehabilitation for the following conditions: Daily direction of a multidisciplinary physical rehabilitation program to ensure safe treatment while eliciting the highest outcome that is of practical value to the patient.: Yes Daily medical management of patient  stability for increased activity during participation in an intensive rehabilitation regime.: Yes Daily analysis of laboratory values and/or radiology reports with any subsequent need for medication adjustment of medical intervention for : Neurological problems;Other  [name] [name] 05/12/2014, 4:04 PM

## 2014-05-12 NOTE — Progress Notes (Addendum)
Physical Therapy Weekly Progress Note  Patient Details  Name: Nicholas [name] MRN: [id number] Date of Birth: [birth year]  Beginning of progress report period: May 06, 2014 End of progress report period: May 12, 2014  Today's Date: 05/12/2014 Time: [id number] Time Calculation (min): 45 min, 45 min  Patient has met 4 of 4 short term goals.    Patient continues to demonstrate the following deficits: motor control, balance, awareness, activity tolerance, coordination, mobility and gait,  and therefore will continue to benefit from skilled PT intervention to enhance overall performance with activity tolerance, balance, postural control, ability to compensate for deficits, functional use of  right lower extremity and left lower extremity, awareness and coordination.  Patient progressing toward long term goals..  Continue plan of care.  Pt is progressing well, and d/c has been moved to 6/6.  Family has not been in during therapy hours; family ed is needed for safe d/c.  PT Short Term Goals Week 2:  PT Short Term Goal 1 (Week 2): pt will perform basic transfer consistently with supervision, 1 or less cue for technique PT Short Term Goal 1 - Progress (Week 2): Met PT Short Term Goal 2 (Week 2): pt will propel w/c to/from therapy with 1 cue to initiate, distant supervision PT Short Term Goal 2 - Progress (Week 2): Met PT Short Term Goal 3 (Week 2): pt will perform gait x 100' with min assist including turns PT Short Term Goal 3 - Progress (Week 2): Met PT Short Term Goal 4 (Week 2): pt will ascend/descend 5 steps 2 rails with supervision, 1 or less cue for technique PT Short Term Goal 4 - Progress (Week 2): Met  Skilled Therapeutic Interventions/Progress Updates:   Tx 1:  OT reported pt had  ambulated in room without assistance before OT session.  Pt had LOB in shower this AM.  Pt had not stretched LLE before OT session, which may have affected his balance.   neuromuscular re-education  : self stretching L hamstrings in sitting, leaning bil UEs over shin. Repetitive sit>< stand biased toward L, without use of UEs for pelvic activation.  Gait training with L AFO and SPC to/from room with close supervision.  Pt shuffles at first until LLE becomes more active.  Up/down 5 steps 2 rails, alternating pattern ascending, non alternating pattern descending, with supervision, without cues.  Up/down curb with SPC- mod assist to ascend , and descend although second person stood by to guard during descent.  Up/down ramp with mod assist.  Pt is very hesitant to shift wt off of LLE without use of UEs on rails.  Pt reported dizziness in sitting at end of tx, which decreased in 1 minute.  Floor transfer with supervision, without cues.    Pt left sitting up in w/c, all needs within reach. Therapist reiterated importance of calling for staff before getting up for safety reasons.  Pt appeared to understand.  Tx 2:  neuromuscular re-education via demo, VCs and tactile cues for self stretching LLE, mod VCS for technique and need to do stretching before gait for best performance and decrease shuffling.    Gait outdoors with SPC, L AFO  on level concrete with min guard assist; sloping concrete with +2 fading to +1,  bil HHA facing pt fading to use of SPC, mod assist .  Up/down curb using SPC, +2 assist; pt eventually decided to step up with LLE, which was successful with assistance.  Pt has difficulty wt shifting on slopes and  curbs unless he has rails. Down/up 7 steps 1 rail on R when ascending,  turned sideways , with min assist, with cues to control descent.  Pt had to descend with R foot first due to location of railing.    Pt still does not have shoes, and slippers tend to slip off heel on R. Therapy Documentation Precautions:  Precautions Precautions: Fall Precaution Comments: Pt has difficulty with reading (education not stroke related); will have difficulty with written  materials Restrictions Weight Bearing Restrictions: No Pain: Pain Assessment Pain Assessment: No/denies pain    See FIM for current functional status  Therapy/Group: Individual Therapy  Frederic Jericho 05/12/2014, 4:14 PM

## 2014-05-12 NOTE — Progress Notes (Signed)
Occupational Therapy Session Note  Patient Details  Name: Nicholas [name] MRN: [id number] Date of Birth: Feb 23, 1951  Today's Date: 05/12/2014 Time: [id number] Time Calculation (min): 45 min  Short Term Goals: Week 1:  OT Short Term Goal 1 (Week 1): Pt will transfer to toilet from w/c with mod A x1 using grab bars. OT Short Term Goal 1 - Progress (Week 1): Met OT Short Term Goal 2 (Week 1): Pt will don a shirt with verbal cues only. OT Short Term Goal 2 - Progress (Week 1): Met OT Short Term Goal 3 (Week 1): Pt will don pants over feet with min A and pull over hips with mod A. OT Short Term Goal 3 - Progress (Week 1): Met OT Short Term Goal 4 (Week 1): Pt will stand from EOB and stand with RW with min A. OT Short Term Goal 4 - Progress (Week 1): Met OT Short Term Goal 5 (Week 1): Pt will bathe with min A. OT Short Term Goal 5 - Progress (Week 1): Met Week 2:  OT Short Term Goal 1 (Week 2): Pt will be able to make a simple sandwich with min A. OT Short Term Goal 2 (Week 2): Pt will be able to ambulate to toilet with RW with supervision.  OT Short Term Goal 3 (Week 2): Pt will be able to accurately state if he needs to use the restroom or if he has had an accident with min cues.  Skilled Therapeutic Interventions/Progress Updates:    Pt seen for BADL retraining of bathing and dressing with a focus on dynamic balance and safety awareness.  Discussed with pt that his supervision goals had been increased to mod I with toileting/ toilet transfers due to his good progress the last 2 days.  Pt was encouraged to ambulated around his room to gather his clothing. Pt's gait was more labored with shuffling feet. Pt was able to complete his self care but as he was leaving the shower, he stumbled and began to lean too far forward.  This clinician steadied pt to prevent a fall.  Pt did not seem fazed by this and laughed when I tried to talk to him about not shuffling his feet.  Pt is not fully aware of his  deficits.  Pt resting in w/c at end of session with call light in reach.  Therapy Documentation Precautions:  Precautions Precautions: Fall Precaution Comments: Pt has difficulty with reading (education not stroke related); will have difficulty with written materials Restrictions Weight Bearing Restrictions: No   Pain: Pain Assessment Pain Assessment: No/denies pain ADL: ADL ADL Comments: Refer to FIM  See FIM for current functional status  Therapy/Group: Individual Therapy  Harlene Ramus 05/12/2014, 12:56 PM

## 2014-05-12 NOTE — Progress Notes (Signed)
Social Work Lucy [name], LCSW Social Worker Signed  Patient Care Conference Service date: 05/12/2014 4:04 PM  Inpatient RehabilitationTeam Conference and Plan of Care Update Date: 05/12/2014   Time: 10:45 AM     Patient Name: Nicholas [name]       Medical Record Number: [id number]   Date of Birth: 10-26-1951 Sex: Male         Room/Bed: 4W21C/4W21C-01 Payor Info: Payor: MEDICARE / Plan: MEDICARE PART A AND B / Product Type: *No Product type* /   Admitting Diagnosis: L ACA INFARCT   Admit Date/Time:  04/28/2014  5:07 PM Admission Comments: No comment available   Primary Diagnosis:  <principal problem not specified> Principal Problem: <principal problem not specified>    Patient Active Problem List     Diagnosis  Date Noted   .  CVA (cerebral infarction)  04/28/2014   .  Stroke  04/26/2014   .  Acute encephalopathy  04/26/2014   .  PSVT  09/15/2010   .  AORTIC INSUFFICIENCY  08/09/2010   .  CLAUDICATION  08/09/2010   .  PALPITATIONS  08/09/2010   .  HYPERLIPIDEMIA-MIXED  08/08/2010   .  TOBACCO ABUSE  08/08/2010   .  HYPERTENSION, UNSPECIFIED  08/08/2010   .  CAD, NATIVE VESSEL  08/08/2010   .  CAROTID ARTERY DISEASE  08/08/2010     Expected Discharge Date: Expected Discharge Date: 05/15/14  Team Members Present: Physician leading conference: Dr. Claudette Laws Social Worker Present: Dossie Der, LCSW Nurse Present: Carlean Purl, RN PT Present: Edman Circle, PT;Blair Hobble, Lillie Columbia, PT OT Present: Bretta Bang, OT SLP Present: Other (comment) Joni Reining Page-SPT) PPS Coordinator present : Edson Snowball, Chapman Fitch, RN, CRRN        Current Status/Progress  Goal  Weekly Team Focus   Medical     neuropsych no mood disturbance, poor balance, initiation  Improve balance and safety   family to bring shoes   Bowel/Bladder     Continent of bowel and bladder. LBM 05/11/2014. Incontinent at times.  Time toileting q 3 hours  Monitor any signs and symptoms of  diarrhea/constipation.   Swallow/Nutrition/ Hydration     regular, thin liquids with intermittent supervision   mod I with least restrictive diet   diet tolerance    ADL's     mod I with bathing, dressing, toileting; supervision transfers  goals upgraded - mod I overall, supervision light meal prep and housekeeping  ADLs dynamic balance, pt/family education   Mobility     Min/S transfers, S bed mobility, S/min-guard gait with RW x150', min A stairs  S transfers, Mod I bed mobitliy, S gait x150' with LRAD (use cane PTA)   Community integration, dynamic gait and balance, curb steps, safety and family training as able.    Communication     min assist for initiation of functional communication   supervision  cognition    Safety/Cognition/ Behavioral Observations    mod assist for executive function and memory  min assist-supervision   carryover of compensatory strategies    Pain     n/a  Pain level 3 or less on a scale of 0-10.  Monitor any onset of pain.   Skin     Buttocks reddened. Barrier cream applied.  No new skin breakdown/infection  Monitor skin q shift.     *See Care Plan and progress notes for long and short-term goals.    Barriers to Discharge:  family not in for education  Possible Resolutions to Barriers:    fam ed on Sat      Discharge Planning/Teaching Needs:    Home with daughter and son in-law to provide 24 hr care-have yet to have daughter call me back and will try to get in for family education      Team Discussion:    Reaching goals sooner than 6/10,can move up to Sat after family education.  Working on continence issues and increasing ritalin.  Experienced dizziness today.   Revisions to Treatment Plan:    Moved up discharge to 6/6    Continued Need for Acute Rehabilitation Level of Care: The patient requires daily medical management by a physician with specialized training in physical medicine and rehabilitation for the following conditions: Daily  direction of a multidisciplinary physical rehabilitation program to ensure safe treatment while eliciting the highest outcome that is of practical value to the patient.: Yes Daily medical management of patient stability for increased activity during participation in an intensive rehabilitation regime.: Yes Daily analysis of laboratory values and/or radiology reports with any subsequent need for medication adjustment of medical intervention for : Neurological problems;Other  Lemar LivingsRebecca G Jasmin Trumbull 05/12/2014, 4:04 PM         Lucy Chrisebecca G Edelyn Heidel, LCSW Social Worker Signed  Patient Care Conference Service date: 05/05/2014 2:01 PM  Inpatient RehabilitationTeam Conference and Plan of Care Update Date: 05/05/2014   Time: 11;10 AM     Patient Name: Nicholas FullingWillie J [name]       Medical Record Number: [id number]   Date of Birth: 07/15/1951 Sex: Male         Room/Bed: 4W21C/4W21C-01 Payor Info: Payor: MEDICARE / Plan: MEDICARE PART A AND B / Product Type: *No Product type* /   Admitting Diagnosis: L ACA INFARCT   Admit Date/Time:  04/28/2014  5:07 PM Admission Comments: No comment available   Primary Diagnosis:  <principal problem not specified> Principal Problem: <principal problem not specified>    Patient Active Problem List     Diagnosis  Date Noted   .  CVA (cerebral infarction)  04/28/2014   .  Stroke  04/26/2014   .  Acute encephalopathy  04/26/2014   .  PSVT  09/15/2010   .  AORTIC INSUFFICIENCY  08/09/2010   .  CLAUDICATION  08/09/2010   .  PALPITATIONS  08/09/2010   .  HYPERLIPIDEMIA-MIXED  08/08/2010   .  TOBACCO ABUSE  08/08/2010   .  HYPERTENSION, UNSPECIFIED  08/08/2010   .  CAD, NATIVE VESSEL  08/08/2010   .  CAROTID ARTERY DISEASE  08/08/2010     Expected Discharge Date: Expected Discharge Date: 05/19/14  Team Members Present: Physician leading conference: Dr. Claudette LawsAndrew Kirsteins Social Worker Present: Dossie DerBecky Primo Innis, LCSW Nurse Present: Carlean PurlMaryann Barbour, RN PT Present: Edman CircleAudra Hall,  PT;Caroline Adriana [place], PT;Blair Hobble, PT OT Present: Bretta BangKris Gellert, OT SLP Present: Fae PippinMelissa Bowie, SLP PPS Coordinator present : Edson SnowballBecky Windsor, Chapman FitchPT;Marie Noel, RN, CRRN        Current Status/Progress  Goal  Weekly Team Focus   Medical     mental status  improve initiation  consider medications   Bowel/Bladder     incontinent of bowel and bladder  timed toileting q three hr  continence of bowel and bladder; monitor q shift   Swallow/Nutrition/ Hydration     regular, thin liquids with intermittent supervision   modified independent for least restrictive diet   diet tolerance    ADL's     supervision with bathing, dressing, toileting;  min-mod A with RW transfers in bathroom  supervision with basic self care, min A with light meal prep and light house keeping  ADLS, dynamic balance, activity tolerance, pt/family education   Mobility     Mod A transfers, S bed mobility, Min A gait with RW x100,' min A 5 stairs wtih 2 rails,  S WC propulsion x150'  S transfers, Mod I bed mobitliy, S gait x150' with LRAD (use cane PTA)   NMR esp for L-side (old CVA), standing balance, cognition and safety awareness, transfer training, gait training and stairs   Communication     min-mod assist to initiate functional communication   supervision  cognition    Safety/Cognition/ Behavioral Observations    mod assist for initiation, mod-max for memory, problem solving, sustained attention    min assist-supervision   education for compensatory strategies    Pain     N/A  N/A  monitor q shift   Skin     buttocks reddened and excoriated related to incontinence  skin to be free of breakdown and reddness with timed toileting success  monitor skin breakdown q shift     *See Care Plan and progress notes for long and short-term goals.    Barriers to Discharge:  see above      Possible Resolutions to Barriers:    see above      Discharge Planning/Teaching Needs:    Home to daughter's home with son in-law and  daughter providing care-24 hr.  Pt is making good progress      Team Discussion:    Making progress, needs timed tolieting to assist with continence.  Trial of ritalin, iniatation issues and deficits from first CVA limit him.   Revisions to Treatment Plan:    None    Continued Need for Acute Rehabilitation Level of Care: The patient requires daily medical management by a physician with specialized training in physical medicine and rehabilitation for the following conditions: Daily direction of a multidisciplinary physical rehabilitation program to ensure safe treatment while eliciting the highest outcome that is of practical value to the patient.: Yes Daily analysis of laboratory values and/or radiology reports with any subsequent need for medication adjustment of medical intervention for : Neurological problems  Lucy [name] 05/05/2014, 3:33 PM          Patient ID: Nicholas [name], male   DOB: 12-Sep-1951, 63 y.o.   MRN: [id number]

## 2014-05-12 NOTE — Progress Notes (Signed)
Recreational Therapy Session Note  Patient Details  Name: Nicholas [name] MRN: [id number] Date of Birth: 1951/05/10 Today's Date: 05/12/2014  Pain: no c/o  Skilled Therapeutic Interventions/Progress Updates: Session focused on outdoor ambulation using [facility] & overall safety.  Pt ambulated on level outdoor surfaces with min assist and uneven/sloping surfaces with +2 fading to +1 assist with use of SPC due to fear of falling.  Therapy/Group: Co-Treatment Clois Dupes 05/12/2014, 4:20 PM

## 2014-05-12 NOTE — Progress Notes (Signed)
Speech Language Pathology Daily Session Note  Patient Details  Name: Nicholas [name] MRN: [id number] Date of Birth: Jul 14, 1951  Today's Date: 05/12/2014 Time: 4081-4481 Time Calculation (min): 45 min  Short Term Goals: Week 2: SLP Short Term Goal 1 (Week 2): Patient will demonstrate basic problem solving during self-care tasks with Min verbal cues SLP Short Term Goal 2 (Week 2): Patient will sustain attention to basic self-care tasks for 10 minutes with Min cues SLP Short Term Goal 3 (Week 2): Patient will label 3 deficits with Mod question cues SLP Short Term Goal 4 (Week 2): Patient will follow multi-step commands during functional tasks with Min vebral cues  Skilled Therapeutic Interventions:  Pt was seen for skilled speech therapy targeting executive function for medication and financial management.  Pt was 100% accurate for loading pills into a pill box with mod faded to supervision cuing for organization, planning, and sequencing.  Furthermore, pt self-monitored for errors with supervision cues.  Pt continues to exhibit difficulty with counting money and generating a given amount with coins and bills and benefited from max visual and verbal cuing for working memory and thought organization.  Pt was noted with improved initiation of functional communication to indicate toileting needs at the end of the session and was overall min assist-supervision for safety awareness.  Continue per current plan of care.     FIM:  Comprehension Comprehension Mode: Auditory Comprehension: 5-Follows basic conversation/direction: With extra time/assistive device Expression Expression Mode: Verbal Expression: 5-Expresses basic 90% of the time/requires cueing < 10% of the time. Social Interaction Social Interaction: 3-Interacts appropriately 50 - 74% of the time - May be physically or verbally inappropriate. Problem Solving Problem Solving: 4-Solves basic 75 - 89% of the time/requires cueing 10 - 24% of  the time Memory Memory: 3-Recognizes or recalls 50 - 74% of the time/requires cueing 25 - 49% of the time  Pain Pain Assessment Pain Assessment: No/denies pain  Therapy/Group: Individual Therapy  Jackalyn Lombard, M.A. CCC-SLP  Nicholas [name] 05/12/2014, 4:18 PM

## 2014-05-12 NOTE — Progress Notes (Signed)
Patient ID: Nicholas [name], male   DOB: 04/10/1951, 63 y.o.   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 Patient lives alone independent with a cane prior to admission. Admitted 04/26/2014 and altered mental status.  MRI with acute nonhemorrhagic left ACA territory infarct as well as remote right parietal lobe infarct. 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  Subjective/Complaints:   Review of Systems - unable to obtain secondary to mental status  Objective: Vital Signs: Blood pressure 115/70, pulse 97, temperature 98 F (36.7 C), temperature source Oral, resp. rate 19, height 6' 0.05" (1.83 m), weight 107.684 kg (237 lb 6.4 oz), SpO2 96.00%. No results found. Results for orders placed during the hospital encounter of 04/28/14 (from the past 72 hour(s))  GLUCOSE, CAPILLARY     Status: None   Collection Time    05/09/14 11:23 AM      Result Value Ref Range   Glucose-Capillary 76  70 - 99 mg/dL  GLUCOSE, CAPILLARY     Status: None   Collection Time    05/09/14  5:12 PM      Result Value Ref Range   Glucose-Capillary 98  70 - 99 mg/dL  URINALYSIS, ROUTINE W REFLEX MICROSCOPIC     Status: Abnormal   Collection Time    05/09/14  5:57 PM      Result Value Ref Range   Color, Urine YELLOW  YELLOW   APPearance CLOUDY (*) CLEAR   Specific Gravity, Urine 1.024  1.005 - 1.030   pH 5.0  5.0 - 8.0   Glucose, UA NEGATIVE  NEGATIVE mg/dL   Hgb urine dipstick LARGE (*) NEGATIVE   Bilirubin Urine NEGATIVE  NEGATIVE   Ketones, ur NEGATIVE  NEGATIVE mg/dL   Protein, ur 100 (*) NEGATIVE mg/dL   Urobilinogen, UA 0.2  0.0 - 1.0 mg/dL   Nitrite POSITIVE (*) NEGATIVE   Leukocytes, UA LARGE (*) NEGATIVE  URINE MICROSCOPIC-ADD ON     Status: Abnormal   Collection Time    05/09/14  5:57 PM      Result Value Ref Range   WBC, UA TOO NUMEROUS TO COUNT  <3 WBC/hpf   RBC / HPF 7-10  <3 RBC/hpf   Bacteria, UA MANY (*) RARE  URINE CULTURE     Status: None   Collection Time    05/09/14  5:58 PM      Result Value Ref Range   Specimen Description URINE, CLEAN CATCH     Special Requests none     Culture  Setup Time       Value: 05/09/2014 18:16     Performed at [facility] Count       Value: 75,000 COLONIES/ML     Performed at [facility]   Culture       Value: STAPHYLOCOCCUS AUREUS     Note: RIFAMPIN AND GENTAMICIN SHOULD NOT BE USED AS SINGLE DRUGS FOR TREATMENT OF STAPH INFECTIONS.     Performed at [facility]   Report Status PENDING    GLUCOSE, CAPILLARY     Status: Abnormal   Collection Time    05/09/14  7:57 PM  Result Value Ref Range   Glucose-Capillary 107 (*) 70 - 99 mg/dL  GLUCOSE, CAPILLARY     Status: None   Collection Time    05/10/14  7:17 AM      Result Value Ref Range   Glucose-Capillary 89  70 - 99 mg/dL  GLUCOSE, CAPILLARY     Status: None   Collection Time    05/10/14 11:25 AM      Result Value Ref Range   Glucose-Capillary 79  70 - 99 mg/dL  GLUCOSE, CAPILLARY     Status: None   Collection Time    05/10/14  4:31 PM      Result Value Ref Range   Glucose-Capillary 98  70 - 99 mg/dL  GLUCOSE, CAPILLARY     Status: Abnormal   Collection Time    05/10/14  8:49 PM      Result Value Ref Range   Glucose-Capillary 191 (*) 70 - 99 mg/dL  GLUCOSE, CAPILLARY     Status: None   Collection Time    05/11/14  7:24 AM      Result Value Ref Range   Glucose-Capillary 86  70 - 99 mg/dL  GLUCOSE, CAPILLARY     Status: None   Collection Time    05/11/14 12:19 PM      Result Value Ref Range   Glucose-Capillary 82  70 - 99 mg/dL   GLUCOSE, CAPILLARY     Status: None   Collection Time    05/11/14  4:22 PM      Result Value Ref Range   Glucose-Capillary 96  70 - 99 mg/dL  GLUCOSE, CAPILLARY     Status: Abnormal   Collection Time    05/11/14  9:11 PM      Result Value Ref Range   Glucose-Capillary 170 (*) 70 - 99 mg/dL  CREATININE, SERUM     Status: Abnormal   Collection Time    05/12/14  5:00 AM      Result Value Ref Range   Creatinine, Ser 1.18  0.50 - 1.35 mg/dL   GFR calc non Af Amer 64 (*) >90 mL/min   GFR calc Af Amer 75 (*) >90 mL/min   Comment: (NOTE)     The eGFR has been calculated using the CKD EPI equation.     This calculation has not been validated in all clinical situations.     eGFR's persistently <90 mL/min signify possible Chronic Kidney     Disease.  GLUCOSE, CAPILLARY     Status: None   Collection Time    05/12/14  7:29 AM      Result Value Ref Range   Glucose-Capillary 87  70 - 99 mg/dL      Head: Normocephalic.  Eyes:  Pupils reactive to light  Neck: Normal range of motion. Neck supple. No thyromegaly present.  Cardiovascular: Normal rate and regular rhythm.  Respiratory: Effort normal and breath sounds normal. No respiratory distress.  GI: Soft. Bowel sounds are normal. He exhibits no distension.  Neurological:  Flat affect. Marice Potter awareness of deficits  Skin: Skin is warm and dry.  Scar right side of neck  motor strength is 5/5 bilateral deltoid, bicep, tricep, grip  Minimal dysmetria bilaterally finger-nose-finger  Right lower extremity 4/5 hip flexor knee extensor ankle dorsi flexion plantar flexor  Left extremity 4/5 in the left hip flexor knee extensor 3 minus ankle dorsiflexor plantar flexor      Assessment/Plan: 1. Functional deficits secondary to embolic left ACA infarct  which require 3+ hours per day of interdisciplinary therapy in a comprehensive inpatient rehab setting. Physiatrist is providing close team supervision and 24 hour management of active medical  problems listed below. Physiatrist and rehab team continue to assess barriers to discharge/monitor patient progress toward functional and medical goals. Team conference today please see physician documentation under team conference tab, met with team face-to-face to discuss problems,progress, and goals. Formulized individual treatment plan based on medical history, underlying problem and comorbidities. FIM: FIM - Bathing Bathing Steps Patient Completed: Chest;Left upper leg;Right Arm;Right lower leg (including foot);Left Arm;Left lower leg (including foot);Abdomen;Front perineal area;Buttocks;Right upper leg Bathing: 6: More than reasonable amount of time  FIM - Upper Body Dressing/Undressing Upper body dressing/undressing steps patient completed: Thread/unthread right sleeve of pullover shirt/dresss;Thread/unthread left sleeve of pullover shirt/dress;Put head through opening of pull over shirt/dress;Pull shirt over trunk Upper body dressing/undressing: 6: More than reasonable amount of time FIM - Lower Body Dressing/Undressing Lower body dressing/undressing steps patient completed: Thread/unthread right pants leg;Thread/unthread left pants leg;Pull pants up/down Lower body dressing/undressing: 6: More than reasonable amount of time  FIM - Toileting Toileting steps completed by patient: Adjust clothing prior to toileting;Performs perineal hygiene;Adjust clothing after toileting Toileting Assistive Devices: Grab bar or rail for support Toileting: 5: Supervision: Safety issues/verbal cues  FIM - Radio producer Devices: Cane;Grab bars Toilet Transfers: 5-To toilet/BSC: Supervision (verbal cues/safety issues);5-From toilet/BSC: Supervision (verbal cues/safety issues)  FIM - Engineer, site Assistive Devices: Arm rests;Cane Bed/Chair Transfer: 5: Bed > Chair or W/C: Supervision (verbal cues/safety issues);5: Chair or W/C > Bed: Supervision  (verbal cues/safety issues)  FIM - Locomotion: Wheelchair Distance: 150 Locomotion: Wheelchair: 5: Travels 150 ft or more: maneuvers on rugs and over door sills with supervision, cueing or coaxing FIM - Locomotion: Ambulation Locomotion: Ambulation Assistive Devices: Journalist, newspaper Ambulation/Gait Assistance: 5: Supervision Locomotion: Ambulation: 5: Travels 150 ft or more with supervision/safety issues  Comprehension Comprehension Mode: Auditory Comprehension: 5-Understands complex 90% of the time/Cues < 10% of the time  Expression Expression Mode: Verbal Expression: 5-Expresses basic 90% of the time/requires cueing < 10% of the time.  Social Interaction Social Interaction: 2-Interacts appropriately 25 - 49% of time - Needs frequent redirection.  Problem Solving Problem Solving: 4-Solves basic 75 - 89% of the time/requires cueing 10 - 24% of the time  Memory Memory: 3-Recognizes or recalls 50 - 74% of the time/requires cueing 25 - 49% of the time Medical Problem List and Plan:  1. Functional deficits secondary to left ACA distribution infarct suspect embolic secondary to unclear source  2. DVT Prophylaxis/Anticoagulation: Subcutaneous Lovenox. Monitor platelet counts and any signs of bleeding  3. Pain Management: Tylenol as needed  4. Hypertension. Cardizem 360 mg daily, Cardura 2 mg daily, Lopressor 50 mg twice a day. Monitor with increased mobility BP on low side this am write parameters 5. Neuropsych: This patient is not yet capable of making decisions on his own behalf. Cog status is improving, aware of therapy schedule 6. Diabetes mellitus with peripheral neuropathy.Controlled Hemoglobin A1c 5.7. Glipizide 5 mg daily. Check blood sugars a.c. and at bedtime. Provide diabetic teaching.  7. Hyperlipidemia. Lipitor  8. CAD with stenting. Cardiac enzymes negative. Continue aspirin Plavix therapy. No chest pain or shortness of breath.  9. History of BPH. Check PVRs x3 10.  Left  foot drop- pt gives hx of back problem causing Left foot weakness but also has had prior Right parietal CVA,  PT note states AFO made Ext  rotation and leg whip worse 11. UTI-start keflex pending culture 12. Poor initiation increase Ritalin, monitor HR LOS (Days) 14 A FACE TO FACE EVALUATION WAS PERFORMED  Charlett Blake 05/12/2014, 8:37 AM

## 2014-05-13 ENCOUNTER — Inpatient Hospital Stay (HOSPITAL_COMMUNITY): Payer: Medicare Other | Admitting: Speech Pathology

## 2014-05-13 ENCOUNTER — Inpatient Hospital Stay (HOSPITAL_COMMUNITY): Payer: Medicare Other | Admitting: Occupational Therapy

## 2014-05-13 ENCOUNTER — Inpatient Hospital Stay (HOSPITAL_COMMUNITY): Payer: Medicare Other | Admitting: *Deleted

## 2014-05-13 ENCOUNTER — Inpatient Hospital Stay (HOSPITAL_COMMUNITY): Payer: Medicare Other

## 2014-05-13 DIAGNOSIS — I69919 Unspecified symptoms and signs involving cognitive functions following unspecified cerebrovascular disease: Secondary | ICD-10-CM

## 2014-05-13 DIAGNOSIS — I6340 Cerebral infarction due to embolism of unspecified cerebral artery: Secondary | ICD-10-CM

## 2014-05-13 DIAGNOSIS — E1149 Type 2 diabetes mellitus with other diabetic neurological complication: Secondary | ICD-10-CM

## 2014-05-13 DIAGNOSIS — I10 Essential (primary) hypertension: Secondary | ICD-10-CM

## 2014-05-13 DIAGNOSIS — I2510 Atherosclerotic heart disease of native coronary artery without angina pectoris: Secondary | ICD-10-CM

## 2014-05-13 LAB — GLUCOSE, CAPILLARY
Glucose-Capillary: 74 mg/dL (ref 70–99)
Glucose-Capillary: 93 mg/dL (ref 70–99)
Glucose-Capillary: 94 mg/dL (ref 70–99)
Glucose-Capillary: 102 mg/dL — ABNORMAL HIGH (ref 70–99)
Glucose-Capillary: 88 mg/dL (ref 70–99)

## 2014-05-13 MED ORDER — LEVOFLOXACIN 250 MG PO TABS
250.0000 mg | ORAL_TABLET | Freq: Every day | ORAL | Status: DC
  Administered 2014-05-13 – 2014-05-15 (×3): 250 mg via ORAL
  Filled 2014-05-13 (×5): qty 1

## 2014-05-13 NOTE — Progress Notes (Signed)
Physical Therapy Session Note  Patient Details  Name: Nicholas [name] MRN: [id number] Date of Birth: Mar 18, 1951  Today's Date: 05/13/2014 Time:1045-1115,  1315-1400 Time Calculation (min): 30, 45 min 15 min missed due to late lunch, pt request to eat before tx  Short Term Goals: Week 3:= LTGs     Skilled Therapeutic Interventions/Progress Updates:  Tx 1:  OT reported that pt does not want to wear L AFO due to pain L toes 2-3.  Toes examined, and not skin problems noted.  Therapist talked to [name], CO who believes the problem is due to pt wearing slippers not shoes.  Pt stated dtr will probably buy shoes for him before d/c Saturday so that AFO plus shoes could be checked by Sat therapist before D/C.  [name] to assess L AFO this PM.  Self stretching bil HS in sitting, min cues for technique.  Pt remembers 30 sec x 3 with questioning cues. Tactile cues for optimal set up.  Advanced gait training with SPC without AFO, on carpet through obstacle course of chairs, without LOB or bumping 7 chairs .  Gait with SPC x 150' with supervisionx 2, including through busy community environment without LOB..  Pt reported at end of session that "he could not believe what a difference the brace makes" and felt more fatigue in his L LE without it.  Spoke with CSW and requested SPC.  Family has not returned her calls.  Tx 2: Pt had just gotten lunch and asked to start in a few minutes. Therapist informed pt she would be back in a few minutes.  Pt ambulated alone room> gym with SPC. He stated he wanted therapist to see "that he could do it". Risks of falling discussed again with pt.  Informed RN.  tx focused on gait, neuromuscular re-education via forced use, VCs, tactile cues for bil hip activation in tall kneeling, reaching within BOS; balance retraining standing on compliant floor mat and wedge to activate balance strategies.    Gait with SPC back to room, without AFO,  supervision.  Discussed appropriate shoes with pt's brother on phone.  He could not find the recommended type in a size 13, but will check at another store and plans to bring shoes ASAP.    Therapy Documentation Precautions:  Precautions Precautions: Fall Precaution Comments: Pt has difficulty with reading (education not stroke related); will have difficulty with written materials Restrictions Weight Bearing Restrictions: No Pain:- none reported    See FIM for current functional status  Therapy/Group: Individual Therapy  Susa Loffler 05/13/2014, 5:06 PM

## 2014-05-13 NOTE — Progress Notes (Signed)
Pt advised that Therapy had recommended he wear the quick release belt when in the chair. He said he doesn't want to wear the belt and will not. Advised Patient that he must call for help when he needs to get out of the chair or bed.

## 2014-05-13 NOTE — Progress Notes (Signed)
Speech Language Pathology Daily Session Note  Patient Details  Name: Nicholas [name] MRN: [id number] Date of Birth: 07-06-1951  Today's Date: 05/13/2014 Time: 2336-1224 Time Calculation (min): 42 min  Short Term Goals: Week 2: SLP Short Term Goal 1 (Week 2): Patient will demonstrate basic problem solving during self-care tasks with Min verbal cues SLP Short Term Goal 2 (Week 2): Patient will sustain attention to basic self-care tasks for 10 minutes with Min cues SLP Short Term Goal 3 (Week 2): Patient will label 3 deficits with Mod question cues SLP Short Term Goal 4 (Week 2): Patient will follow multi-step commands during functional tasks with Min vebral cues  Skilled Therapeutic Interventions:  Pt was seen for skilled speech therapy targeting executive function and sustained attention. Pt required mod assist for organization, planning, and sequencing in order to locate targeted items from a sales flyer.  Pt also benefitted from frequent redirection and cuing for recall of task structure as pt became easily distracted by extraneous information and was noted with significantly prolonged processing time which further exacerbated his working memory impairments.  Pt was noted with functional recall of basic PT recommendations to get new shoes for therapy; however, he required max-total assist from PT, who was present for last 10 minutes of session, to recall specific details of recommendations (i.e. type of shoes, material, width, etc.)  Continue per current plan of care  FIM:  Comprehension Comprehension Mode: Auditory Comprehension: 5-Follows basic conversation/direction: With extra time/assistive device Expression Expression Mode: Verbal Expression: 5-Expresses basic 90% of the time/requires cueing < 10% of the time. Social Interaction Social Interaction: 4-Interacts appropriately 75 - 89% of the time - Needs redirection for appropriate language or to initiate interaction. Problem  Solving Problem Solving: 4-Solves basic 75 - 89% of the time/requires cueing 10 - 24% of the time Memory Memory: 3-Recognizes or recalls 50 - 74% of the time/requires cueing 25 - 49% of the time  Pain Pain Assessment Pain Assessment: No/denies pain  Therapy/Group: Individual Therapy  Jackalyn Lombard, M.A. CCC-SLP   Nicholas [name] 05/13/2014, 4:47 PM

## 2014-05-13 NOTE — Progress Notes (Signed)
Recreational Therapy Discharge Summary Patient Details  Name: Nicholas [name] MRN: [id number] Date of Birth: 1951-06-08 Today's Date: 05/13/2014  Long term goals set: 1  Long term goals met: 1  Comments on progress toward goals: Pt has made great progress toward goal meeting min assist level for simple community pursuits.  Education provided on energy conservation, identification & negotiation of obstacles, & overall safety concerns as it pertain to leisure pursuits.  Pt is discharging home with daughter to provide 24 hour supervision/assist.  Reasons for discharge: discharge from hospital Patient/family agrees with progress made and goals achieved: Yes  Waldon Reining 05/13/2014, 4:53 PM

## 2014-05-13 NOTE — Progress Notes (Signed)
Recreational Therapy Session Note  Patient Details  Name: Nicholas [name] MRN: [id number] Date of Birth: Jan 29, 1951 Today's Date: 05/13/2014  Pain: no c/o Skilled Therapeutic Interventions/Progress Updates: Session focused on discharge planning with regard to use of leisure time.  Pt able to state 2 activities for participation at discharge independently & required mod cuing to list additional activities.    Therapy/Group: Individual Therapy   Clois Dupes 05/13/2014, 4:50 PM

## 2014-05-13 NOTE — Progress Notes (Signed)
Occupational Therapy Session Note  Patient Details  Name: Nicholas [name] MRN: [id number] Date of Birth: [birth year]  Today's Date: 05/13/2014 Time: 0905-1000 Time Calculation (min): 55 min  Short Term Goals: Week 1:  OT Short Term Goal 1 (Week 1): Pt will transfer to toilet from w/c with mod A x1 using grab bars. OT Short Term Goal 1 - Progress (Week 1): Met OT Short Term Goal 2 (Week 1): Pt will don a shirt with verbal cues only. OT Short Term Goal 2 - Progress (Week 1): Met OT Short Term Goal 3 (Week 1): Pt will don pants over feet with min A and pull over hips with mod A. OT Short Term Goal 3 - Progress (Week 1): Met OT Short Term Goal 4 (Week 1): Pt will stand from EOB and stand with RW with min A. OT Short Term Goal 4 - Progress (Week 1): Met OT Short Term Goal 5 (Week 1): Pt will bathe with min A. OT Short Term Goal 5 - Progress (Week 1): Met Week 2:  OT Short Term Goal 1 (Week 2): Pt will be able to make a simple sandwich with min A. OT Short Term Goal 2 (Week 2): Pt will be able to ambulate to toilet with RW with supervision.  OT Short Term Goal 3 (Week 2): Pt will be able to accurately state if he needs to use the restroom or if he has had an accident with min cues.      Skilled Therapeutic Interventions/Progress Updates:    Pt seen for BADL retraining of toileting, bathing, and dressing with a focus on dynamic balance, safe functional mobility, initiation/ awareness. Pt used his belt to stretch his L hamstring and calf after being cued to do so.  Pt ambulated to toilet with cane with close S with cues to use cane properly.  He toileted with mod I and showered with mod I.  He needed very close supervision walking out of the bathroom as he had increased shuffling from decreased LLE coordination.  Pt completed dressing independently after set up.  He was incontinent yesterday wearing just underwear.  This morning, his brief was wet and pt stated he didn't realize he needed to go.  Have discussed with pt several times to ask his family to purchase the depends underwear which will be better fitting and more comfortable. Pt stated that his L AFO is very uncomfortable. Reported this to his PT. Pt resting in chair with call light in reach.      LTG change:  Toilet transfer goal and dressing set at supervision initially, then upgraded to mod I 2 days ago. Goals changed back to supervision as pt continues to demonstrate unstable balance with ambulating to closet to gather clothing and to ambulate to toilet.  Therapy Documentation Precautions:  Precautions Precautions: Fall Precaution Comments: Pt has difficulty with reading (education not stroke related); will have difficulty with written materials Restrictions Weight Bearing Restrictions: No    Vital Signs: Therapy Vitals Pulse Rate: 84 BP: 142/74 mmHg  Pain: Pain Assessment Pain Assessment: 0-10 Pain Score: 0-No painno c/o pain  ADL: ADL ADL Comments: Refer to FIM  See FIM for current functional status  Therapy/Group: Individual Therapy  Geoffery Lyons Elverda Wendel 05/13/2014, 12:10 PM

## 2014-05-13 NOTE — Progress Notes (Signed)
Patient ID: Nicholas [name], male   DOB: Aug 19, 1951, 63 y.o.   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 Patient lives alone independent with a cane prior to admission. Admitted 04/26/2014 and altered mental status.  MRI with acute nonhemorrhagic left ACA territory infarct as well as remote right parietal lobe infarct. 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  Subjective/Complaints: Aware of D/C date, discussed need for supervision  Review of Systems - unable to obtain secondary to mental status  Objective: Vital Signs: Blood pressure 147/77, pulse 81, temperature 97.7 F (36.5 C), temperature source Oral, resp. rate 19, height 6' 0.05" (1.83 m), weight 107.684 kg (237 lb 6.4 oz), SpO2 97.00%. No results found. Results for orders placed during the hospital encounter of 04/28/14 (from the past 72 hour(s))  GLUCOSE, CAPILLARY     Status: None   Collection Time    05/10/14  7:17 AM      Result Value Ref Range   Glucose-Capillary 89  70 - 99 mg/dL  GLUCOSE, CAPILLARY     Status: None   Collection Time    05/10/14 11:25 AM      Result Value Ref Range   Glucose-Capillary 79  70 - 99 mg/dL  GLUCOSE, CAPILLARY     Status: None   Collection Time    05/10/14  4:31 PM      Result Value Ref Range   Glucose-Capillary 98  70 - 99 mg/dL  GLUCOSE, CAPILLARY     Status: Abnormal   Collection Time    05/10/14  8:49 PM      Result Value Ref Range   Glucose-Capillary 191 (*) 70 - 99 mg/dL  GLUCOSE, CAPILLARY     Status: None   Collection Time    05/11/14  7:24 AM      Result Value Ref Range   Glucose-Capillary 86  70 - 99 mg/dL  GLUCOSE, CAPILLARY     Status: None   Collection Time    05/11/14 12:19 PM      Result Value Ref Range   Glucose-Capillary 82  70 - 99 mg/dL  GLUCOSE, CAPILLARY     Status: None   Collection Time    05/11/14  4:22 PM      Result Value Ref Range   Glucose-Capillary 96  70 - 99 mg/dL  GLUCOSE, CAPILLARY     Status: Abnormal   Collection Time    05/11/14  9:11 PM      Result Value Ref Range   Glucose-Capillary 170 (*) 70 - 99 mg/dL  CREATININE, SERUM     Status: Abnormal   Collection Time    05/12/14  5:00 AM      Result Value Ref Range   Creatinine, Ser 1.18  0.50 - 1.35 mg/dL   GFR calc non Af Amer 64 (*) >90 mL/min   GFR calc Af Amer 75 (*) >90 mL/min   Comment: (NOTE)     The eGFR has been calculated using the CKD EPI equation.     This calculation has not been validated in all clinical situations.     eGFR's persistently <90 mL/min signify possible Chronic Kidney     Disease.  GLUCOSE, CAPILLARY     Status: None   Collection Time    05/12/14  7:29 AM      Result Value Ref Range   Glucose-Capillary 87  70 - 99 mg/dL  GLUCOSE, CAPILLARY     Status: None   Collection Time    05/12/14 11:19 AM      Result Value Ref Range   Glucose-Capillary 84  70 - 99 mg/dL  GLUCOSE, CAPILLARY     Status: None   Collection Time    05/12/14  5:13 PM      Result Value Ref Range   Glucose-Capillary 90  70 - 99 mg/dL  GLUCOSE, CAPILLARY     Status: None   Collection Time    05/12/14  9:06 PM      Result Value Ref Range   Glucose-Capillary 74  70 - 99 mg/dL      Head: Normocephalic.  Eyes:  Pupils reactive to light  Neck: Normal range of motion. Neck supple. No thyromegaly present.  Cardiovascular: Normal rate and regular rhythm.  Respiratory: Effort normal and breath sounds normal. No respiratory distress.  GI: Soft. Bowel sounds are normal. He exhibits no distension.  Neurological:  Flat affect. Marice Potter awareness of deficits  Skin: Skin is  warm and dry.  Scar right side of neck  motor strength is 5/5 bilateral deltoid, bicep, tricep, grip  Minimal dysmetria bilaterally finger-nose-finger  Right lower extremity 4/5 hip flexor knee extensor ankle dorsi flexion plantar flexor  Left extremity 4/5 in the left hip flexor knee extensor 3 minus ankle dorsiflexor plantar flexor      Assessment/Plan: 1. Functional deficits secondary to embolic left ACA infarct which require 3+ hours per day of interdisciplinary therapy in a comprehensive inpatient rehab setting. Physiatrist is providing close team supervision and 24 hour management of active medical problems listed below. Physiatrist and rehab team continue to assess barriers to discharge/monitor patient progress toward functional and medical goals.  FIM: FIM - Bathing Bathing Steps Patient Completed: Chest;Left upper leg;Right Arm;Right lower leg (including foot);Left Arm;Left lower leg (including foot);Abdomen;Front perineal area;Buttocks;Right upper leg Bathing: 6: More than reasonable amount of time  FIM - Upper Body Dressing/Undressing Upper body dressing/undressing steps patient completed: Thread/unthread right sleeve of pullover shirt/dresss;Thread/unthread left sleeve of pullover shirt/dress;Put head through opening of pull over shirt/dress;Pull shirt over trunk Upper body dressing/undressing: 6: More than reasonable amount of time FIM - Lower Body Dressing/Undressing Lower body dressing/undressing steps patient completed: Thread/unthread right pants leg;Thread/unthread left pants leg;Pull pants up/down Lower body dressing/undressing: 6: More than reasonable amount of time  FIM - Toileting Toileting steps completed by patient: Adjust clothing prior to toileting;Performs perineal hygiene;Adjust clothing after toileting Toileting Assistive Devices: Grab bar or rail for support Toileting: 5: Supervision: Safety issues/verbal cues  FIM - Engineer, structural Devices: Cane;Grab bars Toilet Transfers: 5-To toilet/BSC: Supervision (verbal cues/safety issues);5-From toilet/BSC: Supervision (verbal cues/safety issues)  FIM - Engineer, site Assistive Devices: Arm rests;Cane Bed/Chair Transfer: 5: Chair or W/C > Bed: Supervision (verbal cues/safety issues);5: Bed > Chair or W/C: Supervision (verbal cues/safety issues)  FIM - Locomotion: Wheelchair Distance: 150 Locomotion: Wheelchair: 1: Total Assistance/staff pushes wheelchair (Pt<25%) FIM - Locomotion: Ambulation Locomotion: Ambulation Assistive Devices: Journalist, newspaper Ambulation/Gait Assistance: 5: Supervision Locomotion: Ambulation: 5: Travels 150 ft or more with supervision/safety issues  Comprehension Comprehension Mode: Auditory Comprehension: 5-Follows basic conversation/direction: With extra time/assistive device  Expression Expression Mode: Verbal Expression: 5-Expresses basic 90% of the time/requires  cueing < 10% of the time.  Social Interaction Social Interaction: 3-Interacts appropriately 50 - 74% of the time - May be physically or verbally inappropriate.  Problem Solving Problem Solving: 4-Solves basic 75 - 89% of the time/requires cueing 10 - 24% of the time  Memory Memory: 3-Recognizes or recalls 50 - 74% of the time/requires cueing 25 - 49% of the time Medical Problem List and Plan:  1. Functional deficits secondary to left ACA distribution infarct suspect embolic secondary to unclear source  2. DVT Prophylaxis/Anticoagulation: Subcutaneous Lovenox. Monitor platelet counts and any signs of bleeding  3. Pain Management: Tylenol as needed  4. Hypertension. Cardizem 360 mg daily, Cardura 2 mg daily, Lopressor 50 mg twice a day. Monitor with increased mobility BP on low side this am write parameters 5. Neuropsych: This patient is not yet capable of making decisions on his own behalf. Cog status is improving, aware of therapy schedule 6.  Diabetes mellitus with peripheral neuropathy.Controlled Hemoglobin A1c 5.7. Glipizide 5 mg daily. Check blood sugars a.c. and at bedtime. Provide diabetic teaching. No insulin for D/C 7. Hyperlipidemia. Lipitor  8. CAD with stenting. Cardiac enzymes negative. Continue aspirin Plavix therapy. No chest pain or shortness of breath.  9. History of BPH. Check PVRs x3 10.  Left foot drop- pt gives hx of back problem causing Left foot weakness but also has had prior Right parietal CVA,  PT note states AFO made Ext rotation and leg whip worse 11. UTI-Staph with final sensitivities available, swith to levoflox 12. Poor initiation increase Ritalin, monitor HR LOS (Days) 15 A FACE TO FACE EVALUATION WAS PERFORMED  Charlett Blake 05/13/2014, 6:38 AM

## 2014-05-14 ENCOUNTER — Inpatient Hospital Stay (HOSPITAL_COMMUNITY): Payer: Medicare Other | Admitting: Occupational Therapy

## 2014-05-14 ENCOUNTER — Inpatient Hospital Stay (HOSPITAL_COMMUNITY): Payer: Medicare Other | Admitting: *Deleted

## 2014-05-14 ENCOUNTER — Inpatient Hospital Stay (HOSPITAL_COMMUNITY): Payer: Medicare Other | Admitting: Speech Pathology

## 2014-05-14 LAB — GLUCOSE, CAPILLARY
GLUCOSE-CAPILLARY: 89 mg/dL (ref 70–99)
Glucose-Capillary: 87 mg/dL (ref 70–99)
Glucose-Capillary: 82 mg/dL (ref 70–99)
Glucose-Capillary: 95 mg/dL (ref 70–99)

## 2014-05-14 MED ORDER — METOPROLOL TARTRATE 50 MG PO TABS: 50.0000 mg | ORAL_TABLET | Freq: Two times a day (BID) | ORAL | Status: DC

## 2014-05-14 MED ORDER — ASPIRIN 81 MG PO TBEC: 81.0000 mg | DELAYED_RELEASE_TABLET | Freq: Every day | ORAL | Status: DC

## 2014-05-14 MED ORDER — METHYLPHENIDATE HCL 10 MG PO TABS: 10.0000 mg | ORAL_TABLET | Freq: Two times a day (BID) | ORAL | Status: DC

## 2014-05-14 MED ORDER — DILTIAZEM HCL ER BEADS 360 MG PO CP24: 360.0000 mg | ORAL_CAPSULE | Freq: Every day | ORAL | Status: DC

## 2014-05-14 MED ORDER — DOXAZOSIN MESYLATE 2 MG PO TABS: 2.0000 mg | ORAL_TABLET | Freq: Every day | ORAL | Status: DC

## 2014-05-14 MED ORDER — CLOPIDOGREL BISULFATE 75 MG PO TABS: 75.0000 mg | ORAL_TABLET | Freq: Every day | ORAL | Status: DC

## 2014-05-14 MED ORDER — GLIPIZIDE ER 5 MG PO TB24: 5.0000 mg | ORAL_TABLET | Freq: Every day | ORAL | Status: DC

## 2014-05-14 MED ORDER — ROSUVASTATIN CALCIUM 10 MG PO TABS: 10.0000 mg | ORAL_TABLET | Freq: Every day | ORAL | Status: DC

## 2014-05-14 MED ORDER — LEVOFLOXACIN 250 MG PO TABS: 250.0000 mg | ORAL_TABLET | Freq: Every day | ORAL | Status: DC

## 2014-05-14 NOTE — Discharge Instructions (Signed)
Inpatient Rehab Discharge Instructions  Nicholas [name] Discharge date and time: No discharge date for patient encounter.   Activities/Precautions/ Functional Status: Activity: activity as tolerated Diet: diabetic diet Wound Care: none needed Functional status:  ___ No restrictions     ___ Walk up steps independently ___ 24/7 supervision/assistance   ___ Walk up steps with assistance ___ Intermittent supervision/assistance  ___ Bathe/dress independently ___ Walk with walker     ___ Bathe/dress with assistance ___ Walk Independently    ___ Shower independently _x STROKE/TIA DISCHARGE INSTRUCTIONS SMOKING Cigarette smoking nearly doubles your risk of having a stroke & is the single most alterable risk factor  If you smoke or have smoked in the last 12 months, you are advised to quit smoking for your health.  Most of the excess cardiovascular risk related to smoking disappears within a year of stopping.  Ask you doctor about anti-smoking medications  [facility] Quit Line: 1-800-QUIT NOW  Free Smoking Cessation Classes [phone]  CHOLESTEROL Know your levels; limit fat & cholesterol in your diet  Lipid Panel     Component Value Date/Time   CHOL 112 04/27/2014 0643   TRIG 103 04/27/2014 0643   HDL 38* 04/27/2014 0643   CHOLHDL 2.9 04/27/2014 0643   VLDL 21 04/27/2014 0643   LDLCALC 53 04/27/2014 0643      Many patients benefit from treatment even if their cholesterol is at goal.  Goal: Total Cholesterol (CHOL) less than 160  Goal:  Triglycerides (TRIG) less than 150  Goal:  HDL greater than 40  Goal:  LDL (LDLCALC) less than 100   BLOOD PRESSURE American Stroke Association blood pressure target is less that 120/80 mm/Hg  Your discharge blood pressure is:  BP: 170/71 mmHg  Monitor your blood pressure  Limit your salt and alcohol intake  Many individuals will require more than one medication for high blood pressure  DIABETES (A1c is a blood sugar average for last 3 months)  Goal HGBA1c is under 7% (HBGA1c is blood sugar average for last 3 months)  Diabetes:     Lab Results  Component Value Date   HGBA1C 5.7* 04/27/2014     Your HGBA1c can be lowered with medications, healthy diet, and exercise.  Check your blood sugar as directed by your physician  Call your physician if you experience unexplained or low blood sugars.  PHYSICAL ACTIVITY/REHABILITATION Goal is 30 minutes at least 4 days per week  Activity: Increase activity slowly, Therapies: Physical Therapy: Home Health Return to work:   Activity decreases your risk of heart attack and stroke and makes your heart stronger.  It helps control your weight and blood pressure; helps you relax and can improve your mood.  Participate in a regular exercise program.  Talk with your doctor about the best form of exercise for you (dancing, walking, swimming, cycling).  DIET/WEIGHT Goal is to maintain a healthy weight  Your discharge diet is: General  liquids Your height is:  Height: 6' 0.05" (183 cm) Your current weight is: Weight: 107.684 kg (237 lb 6.4 oz) Your Body Mass Index (BMI) is:  BMI (Calculated): 31.9  Following the type of diet specifically designed for you will help prevent another stroke.  Your goal weight range is:    Your goal Body Mass Index (BMI) is 19-24.  Healthy food habits can help reduce 3 risk factors for stroke:  High cholesterol, hypertension, and excess weight.  RESOURCES Stroke/Support Group:  Call [phone]   STROKE EDUCATION PROVIDED/REVIEWED AND GIVEN  TO PATIENT Stroke warning signs and symptoms How to activate emergency medical system (call 911). Medications prescribed at discharge. Need for follow-up after discharge. Personal risk factors for stroke. Pneumonia vaccine given:  Flu vaccine given:  My questions have been answered, the writing is legible, and I understand these instructions.  I will adhere to these goals & educational materials that have been provided to  me after my discharge from the hospital.    __ Walk with assistance    ___ Shower with assistance ___ No alcohol     ___ Return to work/school ________  Special Instructions:    COMMUNITY REFERRALS UPON DISCHARGE:    Home Health:   PT, OT, RN, SPT  Agency:[facility] Phone:[phone] Date of last service:05/15/2014   Medical Equipment/Items Ordered:CANE & TUB SEAT  Agency/Supplier:[facility]   [phone]   GENERAL COMMUNITY RESOURCES FOR PATIENT/FAMILY: Support Groups:[facility] SUPPORT GROUP  My questions have been answered and I understand these instructions. I will adhere to these goals and the provided educational materials after my discharge from the hospital.  Patient/Caregiver Signature _______________________________ Date __________  Clinician Signature _______________________________________ Date __________  Please bring this form and your medication list with you to all your follow-up doctor's appointments.

## 2014-05-14 NOTE — Progress Notes (Signed)
Occupational Therapy Session Note  Patient Details  Name: Nicholas [name] MRN: [id number] Date of Birth: 21-Nov-1951  Today's Date: 05/14/2014 Time: 0802-0900 Time Calculation (min): 58 min  Short Term Goals: Week 1:  OT Short Term Goal 1 (Week 1): Pt will transfer to toilet from w/c with mod A x1 using grab bars. OT Short Term Goal 1 - Progress (Week 1): Met OT Short Term Goal 2 (Week 1): Pt will don a shirt with verbal cues only. OT Short Term Goal 2 - Progress (Week 1): Met OT Short Term Goal 3 (Week 1): Pt will don pants over feet with min A and pull over hips with mod A. OT Short Term Goal 3 - Progress (Week 1): Met OT Short Term Goal 4 (Week 1): Pt will stand from EOB and stand with RW with min A. OT Short Term Goal 4 - Progress (Week 1): Met OT Short Term Goal 5 (Week 1): Pt will bathe with min A. OT Short Term Goal 5 - Progress (Week 1): Met Week 2:  OT Short Term Goal 1 (Week 2): Pt will be able to make a simple sandwich with min A. OT Short Term Goal 2 (Week 2): Pt will be able to ambulate to toilet with RW with supervision.  OT Short Term Goal 3 (Week 2): Pt will be able to accurately state if he needs to use the restroom or if he has had an accident with min cues.  Skilled Therapeutic Interventions/Progress Updates:      Pt seen for BADL retraining of toileting, bathing, and dressing with a focus on safe functional mobility and dynamic balance.  Pt ambulated into bathroom to use the toilet with SPC. Pt needed close supervision as his gait had an unsteady shuffling pattern.  Pt was able to complete self care with mod I. Discussed safety at home and need for use of Depends underwear versus brief.  Pt worked on making his bed with close supervision. He was distracted by his new shoes and AFO stating he was not used to how they felt. Encouraged pt to participate in homemaking activities at home with assist. Pt resting in w/c at end of session with call light in reach.  Therapy  Documentation Precautions:  Precautions Precautions: Fall Precaution Comments: Pt has difficulty with reading (education not stroke related); will have difficulty with written materials Restrictions Weight Bearing Restrictions: No    Vital Signs: Therapy Vitals BP: 140/64 mmHg Pain:  no c/o pain ADL: ADL ADL Comments: Mod I with toileting, bathing, grooming; set up assist for dressing, supervision with toilet and shower stall transfers with shower chair  See FIM for current functional status  Therapy/Group: Individual Therapy  Geoffery Lyons Saguier 05/14/2014, 12:06 PM

## 2014-05-14 NOTE — Progress Notes (Signed)
Social Work Patient ID: Nicholas [name], male   DOB: Feb 23, 1951, 63 y.o.   MRN: [id number] Finally spoke with [name]-daughter to discuss pt's progress and earlier discharge date.  Discussed pt's progress and how he has reached his goals Sooner than expected and will need 24 hour supervision level.  The plan is to go to daughter's home with the plan to eventually go back to his home. They can be here tomorrow at 9;00 am for education.  Agreeable to home health follow up, got daughter's address while on the phone.  Will let team know About the plan for education tomorrow.  Prepare for discharge tomorrow.

## 2014-05-14 NOTE — Progress Notes (Signed)
Occupational Therapy Discharge Summary  Patient Details  Name: Nicholas [name] MRN: [id number] Date of Birth: 25-May-1951  Today's Date: 05/15/2014  Patient has met 53 of 15 long term goals due to improved activity tolerance, improved balance, postural control, ability to compensate for deficits, functional use of  LEFT upper extremity, improved attention, improved awareness and improved coordination.  Patient to discharge at overall Supervision level.  Patient's care partner is independent to provide the necessary physical and cognitive assistance at discharge.    Reasons goals not met: n/a  Recommendation:  Patient will benefit from ongoing skilled OT services in home health setting to continue to advance functional skills in the area of BADL and iADL.  Equipment: shower chair  Reasons for discharge: treatment goals met  Patient/family agrees with progress made and goals achieved: Yes  OT Discharge ADL ADL ADL Comments: Mod I with toileting, bathing, grooming; set up assist for dressing, supervision with toilet and shower stall transfers with shower chair Vision/Perception  Vision- History Baseline Vision/History: Wears glasses Wears Glasses: At all times Patient Visual Report: No change from baseline Vision- Assessment Eye Alignment: Within Functional Limits Ocular Range of Motion: Within Functional Limits Alignment/Gaze Preference: Within Defined Limits Tracking/Visual Pursuits: Decreased smoothness of horizontal tracking Saccades: Additional eye shifts occurred during testing Convergence: Impaired (comment) Visual Fields: No apparent deficits Perception Perception: Within Functional Limits Praxis Praxis: Intact  Cognition Overall Cognitive Status: Impaired/Different from baseline Arousal/Alertness: Awake/alert Orientation Level: Oriented X4 Sensation Sensation Light Touch: Appears Intact Stereognosis: Appears Intact Hot/Cold: Appears Intact Coordination Gross  Motor Movements are Fluid and Coordinated: No Fine Motor Movements are Fluid and Coordinated: No Coordination and Movement Description: Pt with decreased fluidity with LLE movements due to prior CVA Finger Nose Finger Test: slight dysmetria on the L, but good speed Motor  Motor Motor: Hemiplegia Motor - Skilled Clinical Observations: Decreased LLE strength Motor - Discharge Observations: Pt has regained motor activation on R-side  Mobility    close supervision with bathroom transfers using [facility] Trunk/Postural Assessment  Postural Control Righting Reactions: improved rightting reactions in sitting so pt is mod I with balance in sitting and standing during basic ADLs  Balance Static Sitting Balance Static Sitting - Level of Assistance: 7: Independent Dynamic Sitting Balance Dynamic Sitting - Level of Assistance: 6: Modified independent (Device/Increase time) Static Standing Balance Static Standing - Level of Assistance: 6: Modified independent (Device/Increase time) Dynamic Standing Balance Dynamic Standing - Level of Assistance: 6: Modified independent (Device/Increase time) (Using UE support during self care tasks.) Extremity/Trunk Assessment RUE Assessment RUE Assessment: Within Functional Limits LUE Assessment LUE Assessment: Within Functional Limits  See FIM for current functional status  Harlene Ramus 05/14/2014, 12:08 PM

## 2014-05-14 NOTE — Discharge Summary (Signed)
Discharge summary job [id number]

## 2014-05-14 NOTE — Progress Notes (Signed)
Physical Therapy Session Note  Patient Details  Name: Nicholas [name] MRN: [id number] Date of Birth: 06/19/1951  Today's Date: 05/14/2014 Time: 1120-1205 and 13:02-13:46 (44min)  Time Calculation (min): 45 min   Skilled Therapeutic Interventions/Progress Updates:  First tx focused on gait training, curb, ramp, stairs, transfer training, and Nustep for LE NMR.  Pt provided extensive education on foot protection and brace wearing instructions. Pt verbalized understanding of elevating LEs while resting, removing brace while resting if uncomfortable, and performing skin checks with help of family to reduce risk of breakdown. Also discussed stroke risk reduction and lifestyle changes.   Pt performed transfers from furniture and varying surfaces with S only, needing no cues for safety.   Performed gait in controlled and home settings with S only, 2x200' and x50' respectively. Pt was able to negotiate obstacles and tight spaces without LOB. Pt continues to perform this type of situation with shuffling steps rather than full L weight shift.   Performed 12 stairs with bil rails with S and step-to pattern.  Pt needed Min A with SPC for curb and ramp due to decreased L shift. However when given door threshold to assist, he only needed min-guard for curb.   Performed nustep x935min with bil UEs and LEs for increased coordination and timing of LLE ext. Performed NMR via forced use and manual facilitation during mobility tasks as well.  [facility] and brace off.    Second tx focused on WC propulsion, floor transfers, and Nurse, learning disabilityBerg Balance assessment.  Provided pt with handout for self-stretching as well as brace wear and LE elevation instructions. Pt reports having no further questions/concerns. Instructed pt in deep breathing exercise for stress reduction as he reported this as a difficulty lifestyle modification to make.   Pt propelled WC x200' with S in controlled and apartment  settings. Pt able to demonstrate dynamic sitting balance while donning shoes/brace mod I and dynamic standing balance with S during all functional tasks.   Pt engaged in floor transfer with S and only efficiency cues needed. Pt verbalized several safety techniques and when to call for help.   Repeated Berg balance assessment, scoring 32/56, which still indicates high risk of falls, but 9 points higher than initially scored. Pt educated again on functional implications and safety needs.   [facility] needs [facility] reach and no further concerns. Looking forward to discharge.        Therapy Documentation Precautions:  Precautions Precautions: Fall Precaution Comments: Pt has difficulty with reading (education not stroke related); will have difficulty with written materials Restrictions Weight Bearing Restrictions: No    Vital Signs: Therapy Vitals BP: 140/64 mmHg Pain: none and none   Mobility: Bed Mobility Bed Mobility: Supine to Sit;Sit to Supine Supine to Sit: 6: Modified independent (Device/Increase time) Sit to Supine: 6: Modified independent (Device/Increase time) Transfers Transfers: Yes Stand Pivot Transfers: 5: Supervision Stand Pivot Transfer Details (indicate cue type and reason): Pt continues to need occasional safety cues during transfers, marked by shuffling steps. Initiation and and motor planning functional Locomotion : Ambulation Ambulation: Yes Ambulation/Gait Assistance: 5: Supervision Ambulation Distance (Feet): 200 Feet Assistive device: Straight cane Ambulation/Gait Assistance Details: Pt begins walking with shuffling steps, which lengthen as gait progresses. Bil LEs externally rotated R>L with decreased weight shifting to LLE.  Stairs / Additional Locomotion Stairs: Yes Stairs Assistance: 5: Supervision Stairs Assistance Details (indicate cue type and reason): S for safety only Stair  Management Technique: Two rails;Step to  pattern;Forwards Number of Stairs: 12 Height of Stairs: 6 Ramp: 4: Min assist Curb: 4: Min Paediatric nurse: Yes Wheelchair Assistance: 5: Investment banker, operational: Both upper extremities Wheelchair Parts Management: Needs assistance Distance: 200  Trunk/Postural Assessment : Cervical Assessment Cervical Assessment: Exceptions to [facility] Cervical AROM Overall Cervical AROM Comments: limited L and R rotation, extension due to hypertrophic scarring R neck  Thoracic Assessment Thoracic Assessment: Within Functional Limits Lumbar Assessment Lumbar Assessment: Exceptions to [facility] Lumbar Strength Overall Lumbar Strength Comments: waist crease R, likely due to decreased symmetrical posture from old CVA Postural Control Postural Control: Deficits on evaluation Righting Reactions: improved rightting reactions in sitting so pt is mod I with balance in sitting and standing during basic ADLs  Balance: Balance Balance Assessed: Yes Static Sitting Balance Static Sitting - Level of Assistance: 7: Independent Dynamic Sitting Balance Dynamic Sitting - Level of Assistance: 6: Modified independent (Device/Increase time) Static Standing Balance Static Standing - Level of Assistance: 6: Modified independent (Device/Increase time) Dynamic Standing Balance Dynamic Standing - Level of Assistance: 5: Stand by assistance  See FIM for current functional status  Therapy/Group: Individual Therapy Clydene Laming, PT, DPT  05/14/2014, 12:45 PM

## 2014-05-14 NOTE — Progress Notes (Signed)
Social Work Discharge Note Discharge Note  The overall goal for the admission was met for:   Discharge location: [place] LEVEL  Length of Stay: Yes-17 DAYS  Discharge activity level: Yes-SUPERVISION LEVEL  Home/community participation: Yes  Services provided included: MD, RD, PT, OT, SLP, RN, CM, TR, Pharmacy, [place]: Medicare  Follow-up services arranged: Home Health: [facility]-PT,OT,RN,SPT, DME: [place] and Patient/Family has no preference for HH/DME agencies  Comments (or additional information):FAMILY EDUCATION ON DAY OF DISCHARGE, PT REACHED GOALS SOONER THAN SET D/C DATE SO MOVED UP ALL IN AGREEMENT WITH PLAN  Patient/Family verbalized understanding of follow-up arrangements: Yes  Individual responsible for coordination of the follow-up plan: [name]-DAUGHTER & PT  Confirmed correct DME delivered: Elease Hashimoto 05/14/2014    Gardiner Rhyme Cruzito Standre

## 2014-05-14 NOTE — Progress Notes (Signed)
Speech Language Pathology Daily Session Note  Patient Details  Name: Nicholas [name] MRN: [id number] Date of Birth: 03-26-1951  Today's Date: 05/14/2014 Time: 1008-1050 Time Calculation (min): 42 min  Short Term Goals: Week 2: SLP Short Term Goal 1 (Week 2): Patient will demonstrate basic problem solving during self-care tasks with Min verbal cues SLP Short Term Goal 2 (Week 2): Patient will sustain attention to basic self-care tasks for 10 minutes with Min cues SLP Short Term Goal 3 (Week 2): Patient will label 3 deficits with Mod question cues SLP Short Term Goal 4 (Week 2): Patient will follow multi-step commands during functional tasks with Min vebral cues  Skilled Therapeutic Interventions:  Pt was seen for skilled speech therapy targeting executive function, memory, and sustained attention.  Pt required min cuing for safety during toileting as he attempted to get up without asking for assistance.  SLP reinforced education of safety precautions to facilitate improved carryover post discharge as pt will be leaving tomorrow to go home.  Pt recalled route to ADL kitchen from his room while propelling his wheelchair with modified independence.  Pt required mod assist verbal cuing to locate targeted items in the kitchen while standing due to working memory impairments.  Additionally, pt benefited from min cuing for organization when sorting kitchen items into categories based on similarities.  SLP will follow up for skilled family education tomorrow targeting compensatory strategies for memory, attention, thought organization, and awareness.    FIM Comprehension Comprehension Mode: Auditory Comprehension: 5-Follows basic conversation/direction: With no assist Expression Expression Mode: Verbal Expression: 5-Expresses basic 90% of the time/requires cueing < 10% of the time. Social Interaction Social Interaction: 4-Interacts appropriately 75 - 89% of the time - Needs redirection for  appropriate language or to initiate interaction. Problem Solving Problem Solving: 4-Solves basic 75 - 89% of the time/requires cueing 10 - 24% of the time Memory Memory: 3-Recognizes or recalls 50 - 74% of the time/requires cueing 25 - 49% of the time FIM - Eating Eating Activity: 5: Set-up assist for open containers  Pain Pain Assessment Pain Assessment: No/denies pain  Therapy/Group: Individual Therapy  Jackalyn Lombard, M.A. CCC-SLP   Nicholas [name] 05/14/2014, 4:01 PM

## 2014-05-14 NOTE — Progress Notes (Signed)
Physical Therapy Discharge Summary  Patient Details  Name: Nicholas [name] MRN: [id number] Date of Birth: Mar 02, 1951  Today's Date: 05/14/2014 Time: 1120-1205 Time Calculation (min): 45 min  Patient has met 11 of 11 long term goals due to improved activity tolerance, improved balance, improved postural control, increased strength, ability to compensate for deficits, functional use of  right upper extremity and right lower extremity, improved attention, improved awareness and improved coordination.  Patient to discharge at an ambulatory level Supervision.   Patient's care partner is independent to provide the necessary cognitive assistance at discharge.  Recommendation:  Patient will benefit from ongoing skilled PT services in home health setting to continue to advance safe functional mobility, address ongoing impairments in RLE neuromuscular re-education, dynamic balance, and health promotion, and minimize fall risk.  Equipment: [employer]; [employer]; pt may choose to pay for rental w/c at a later date  Reasons for discharge: treatment goals met and discharge from hospital  Patient/family agrees with progress made and goals achieved: Yes  PT Discharge Precautions/Restrictions Precautions Precautions: Fall Precaution Comments: Pt has difficulty with reading (education not stroke related); will have difficulty with written materials Restrictions Weight Bearing Restrictions: No Vital Signs Therapy Vitals BP: 140/64 mmHg   Vision/Perception  Vision - History Baseline Vision: Wears glasses all the time Patient Visual Report: No change from baseline Vision - Assessment Eye Alignment: Within Functional Limits Vision Assessment: Vision tested Ocular Range of Motion: Within Functional Limits Alignment/Gaze Preference: Within Defined Limits Tracking/Visual Pursuits: Decreased smoothness of horizontal tracking Convergence: Impaired (comment) Perception Perception: Within  Functional Limits Praxis Praxis: Intact  Cognition Overall Cognitive Status: Impaired/Different from baseline Arousal/Alertness: Awake/alert Orientation Level: Oriented X4  Pt has poor awareness of his balance limitations functionally.  He verbalizes risks for falls if he attempts to ambulate by himself, but chooses to do so anyway.   Sensation Sensation Light Touch: Appears Intact Stereognosis: Appears Intact Hot/Cold: Appears Intact Proprioception: Impaired Detail Proprioception Impaired Details: Impaired LLE Additional Comments: Pt 50% accurate with great toe proprioception, 100% at ankle. RLE intact Coordination Gross Motor Movements are Fluid and Coordinated: No Fine Motor Movements are Fluid and Coordinated: No Coordination and Movement Description: Pt with decreased fluidity with LLE movements due to prior CVA-related weakness and decreased joint mobility.  Finger Nose Finger Test: [facility] Heel Shin Test: Improved and WFL for RLE, decreased accuracy and excursion LLE Motor  Motor Motor: Hemiplegia;Abnormal tone Motor - Skilled Clinical Observations: Decreased LLE strength with compensatory and synergistic movements during functional mobility, esp with adductors and rigid ext in stance. RLE has regained majority of motor activation Motor - Discharge Observations: Pt has regained motor activation on R-side, but continues to perform mobility with compensations from LLE weakness  Mobility Bed Mobility Bed Mobility: Supine to Sit;Sit to Supine Supine to Sit: 6: Modified independent (Device/Increase time) Sit to Supine: 6: Modified independent (Device/Increase time) Transfers Transfers: Yes Stand Pivot Transfers: 5: Supervision Stand Pivot Transfer Details (indicate cue type and reason): Pt continues to need occasional safety cues during transfers, marked by shuffling steps. Initiation and and motor planning functional Locomotion  Ambulation Ambulation: Yes Ambulation/Gait  Assistance: 5: Supervision Ambulation Distance (Feet): 200 Feet Assistive device: Straight cane Ambulation/Gait Assistance Details: Pt begins walking with shuffling steps, which lengthen as gait progresses. Bil LEs externally rotated R>L with decreased weight shifting to LLE.  Stairs / Additional Locomotion Stairs: Yes Stairs Assistance: 5: Supervision Stairs Assistance Details (indicate cue type and reason): S for safety only Stair  Management Technique: Two rails;Step to pattern;Forwards Number of Stairs: 12 Height of Stairs: 6 Ramp: 4: Min assist Curb: 4: Min Chemical engineer: Yes Wheelchair Assistance: 5: Careers information officer: Both upper extremities Wheelchair Parts Management: Needs assistance Distance: 200  Trunk/Postural Assessment  Cervical Assessment Cervical Assessment: Exceptions to [facility] Cervical AROM Overall Cervical AROM Comments: limited L and R rotation, extension due to hypertrophic scarring R neck  Thoracic Assessment Thoracic Assessment: Within Functional Limits Lumbar Assessment Lumbar Assessment: Exceptions to [facility] Lumbar Strength Overall Lumbar Strength Comments: waist crease R, likely due to decreased symmetrical posture from old CVA Postural Control Postural Control: Deficits on evaluation Righting Reactions: improved rightting reactions in sitting so pt is mod I with balance in sitting and standing during basic ADLs  Balance Balance Balance Assessed: Yes Static Sitting Balance Static Sitting - Level of Assistance: 7: Independent Dynamic Sitting Balance Dynamic Sitting - Level of Assistance: 6: Modified independent (Device/Increase time) Static Standing Balance Static Standing - Level of Assistance: 6: Modified independent (Device/Increase time) Dynamic Standing Balance Dynamic Standing - Level of Assistance: 5: Stand by assistance Extremity Assessment  RUE Assessment RUE Assessment: Within Functional  Limits LUE Assessment LUE Assessment: Within Functional Limits RLE Assessment RLE Assessment: Within Functional Limits RLE Strength RLE Overall Strength Comments: Grossly 4+/5 throughout LLE Assessment LLE Assessment: Exceptions to [facility] LLE Strength LLE Overall Strength Comments: grossly in sitting, 4/5 hip flex,add,abd; 4-/5 knee ext, 2-/5 knee flex, 1+/5 ankle DF/PF  See FIM for current functional status  Kennieth Rad, PT, DPT  05/14/2014, 12:41 PM

## 2014-05-14 NOTE — Progress Notes (Signed)
Patient ID: Nicholas [name], male   DOB: [birth year], 63 y.o.   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 Patient lives alone independent with a cane prior to admission. Admitted 04/26/2014 and altered mental status.  MRI with acute nonhemorrhagic left ACA territory infarct as well as remote right parietal lobe infarct. 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  Subjective/Complaints: Aware of D/C date, states family here in am  Review of Systems - unable to obtain secondary to mental status  Objective: Vital Signs: Blood pressure 117/66, pulse 89, temperature 97.8 F (36.6 C), temperature source Oral, resp. rate 18, height 6' 0.05" (1.83 m), weight 107.684 kg (237 lb 6.4 oz), SpO2 98.00%. No results found. Results for orders placed during the hospital encounter of 04/28/14 (from the past 72 hour(s))  GLUCOSE, CAPILLARY     Status: None   Collection Time    05/11/14 12:19 PM      Result Value Ref Range   Glucose-Capillary 82  70 - 99 mg/dL  GLUCOSE, CAPILLARY     Status: None   Collection Time    05/11/14  4:22 PM      Result Value Ref Range   Glucose-Capillary 96  70 - 99 mg/dL  GLUCOSE, CAPILLARY     Status: Abnormal   Collection Time    05/11/14  9:11 PM      Result Value Ref Range   Glucose-Capillary 170 (*) 70 - 99 mg/dL  CREATININE, SERUM     Status: Abnormal   Collection Time    05/12/14  5:00 AM      Result Value Ref Range   Creatinine, Ser 1.18  0.50 - 1.35 mg/dL   GFR calc non Af Amer 64 (*) >90 mL/min   GFR calc Af Amer 75 (*) >90 mL/min   Comment: (NOTE)     The eGFR  has been calculated using the CKD EPI equation.     This calculation has not been validated in all clinical situations.     eGFR's persistently <90 mL/min signify possible Chronic Kidney     Disease.  GLUCOSE, CAPILLARY     Status: None   Collection Time    05/12/14  7:29 AM      Result Value Ref Range   Glucose-Capillary 87  70 - 99 mg/dL  GLUCOSE, CAPILLARY     Status: None   Collection Time    05/12/14 11:19 AM      Result Value Ref Range   Glucose-Capillary 84  70 - 99 mg/dL  GLUCOSE, CAPILLARY     Status: None   Collection Time    05/12/14  5:13 PM      Result Value Ref Range   Glucose-Capillary 90  70 - 99 mg/dL  GLUCOSE, CAPILLARY     Status: None   Collection Time    05/12/14  9:06 PM      Result Value Ref Range   Glucose-Capillary 74  70 - 99 mg/dL  GLUCOSE, CAPILLARY     Status: None   Collection Time    05/13/14  7:42 AM      Result Value Ref Range   Glucose-Capillary 93  70 - 99  mg/dL  GLUCOSE, CAPILLARY     Status: None   Collection Time    05/13/14 11:25 AM      Result Value Ref Range   Glucose-Capillary 94  70 - 99 mg/dL  GLUCOSE, CAPILLARY     Status: Abnormal   Collection Time    05/13/14  4:26 PM      Result Value Ref Range   Glucose-Capillary 102 (*) 70 - 99 mg/dL  GLUCOSE, CAPILLARY     Status: None   Collection Time    05/13/14  9:59 PM      Result Value Ref Range   Glucose-Capillary 88  70 - 99 mg/dL      Head: Normocephalic.  Eyes:  Pupils reactive to light  Neck: Normal range of motion. Neck supple. No thyromegaly present.  Cardiovascular: Normal rate and regular rhythm.  Respiratory: Effort normal and breath sounds normal. No respiratory distress.  GI: Soft. Bowel sounds are normal. He exhibits no distension.  Neurological:  Flat affect. Marice Potter awareness of deficits  Skin: Skin is warm and dry.  Scar right side of neck  motor strength is 5/5 bilateral deltoid, bicep, tricep, grip  Minimal dysmetria bilaterally finger-nose-finger   Right lower extremity 4/5 hip flexor knee extensor ankle dorsi flexion plantar flexor  Left extremity 4/5 in the left hip flexor knee extensor 3 minus ankle dorsiflexor plantar flexor      Assessment/Plan: 1. Functional deficits secondary to embolic left ACA infarct which require 3+ hours per day of interdisciplinary therapy in a comprehensive inpatient rehab setting. Ready for D/C in am Will leave D/C instructions today  PM&R f/u in 4 wks PCP f/u 1-2 wks  FIM: FIM - Bathing Bathing Steps Patient Completed: Chest;Left upper leg;Right Arm;Right lower leg (including foot);Left Arm;Left lower leg (including foot);Abdomen;Front perineal area;Buttocks;Right upper leg Bathing: 6: More than reasonable amount of time  FIM - Upper Body Dressing/Undressing Upper body dressing/undressing steps patient completed: Thread/unthread right sleeve of pullover shirt/dresss;Thread/unthread left sleeve of pullover shirt/dress;Put head through opening of pull over shirt/dress;Pull shirt over trunk Upper body dressing/undressing: 5: Set-up assist to: Obtain clothing/put away FIM - Lower Body Dressing/Undressing Lower body dressing/undressing steps patient completed: Thread/unthread right pants leg;Thread/unthread left pants leg;Pull pants up/down Lower body dressing/undressing: 5: Set-up assist to: Obtain clothing  FIM - Toileting Toileting steps completed by patient: Adjust clothing prior to toileting;Performs perineal hygiene;Adjust clothing after toileting Toileting Assistive Devices: Grab bar or rail for support Toileting: 6: More than reasonable amount of time  FIM - Radio producer Devices: Cane;Grab bars Toilet Transfers: 5-To toilet/BSC: Supervision (verbal cues/safety issues);5-From toilet/BSC: Supervision (verbal cues/safety issues)  FIM - Engineer, site Assistive Devices: Arm rests;Cane Bed/Chair Transfer: 5: Bed > Chair or W/C: Supervision  (verbal cues/safety issues);5: Chair or W/C > Bed: Supervision (verbal cues/safety issues)  FIM - Locomotion: Wheelchair Distance: 150 Locomotion: Wheelchair: 0: Activity did not occur FIM - Locomotion: Ambulation Locomotion: Ambulation Assistive Devices: Journalist, newspaper Ambulation/Gait Assistance: 5: Supervision Locomotion: Ambulation: 5: Travels 150 ft or more with supervision/safety issues  Comprehension Comprehension Mode: Auditory Comprehension: 5-Follows basic conversation/direction: With extra time/assistive device  Expression Expression Mode: Verbal Expression: 5-Expresses basic 90% of the time/requires cueing < 10% of the time.  Social Interaction Social Interaction: 4-Interacts appropriately 75 - 89% of the time - Needs redirection for appropriate language or to initiate interaction.  Problem Solving Problem Solving: 4-Solves basic 75 - 89% of the time/requires cueing 10 - 24% of the time  Memory Memory: 3-Recognizes or recalls 50 - 74% of the time/requires cueing 25 - 49% of the time Medical Problem List and Plan:  1. Functional deficits secondary to left ACA distribution infarct suspect embolic secondary to unclear source  2. DVT Prophylaxis/Anticoagulation: Subcutaneous Lovenox. Monitor platelet counts and any signs of bleeding  3. Pain Management: Tylenol as needed  4. Hypertension. Cardizem 360 mg daily, Cardura 2 mg daily, Lopressor 50 mg twice a day. Monitor with increased mobility BP on low side this am write parameters 5. Neuropsych: This patient is not yet capable of making decisions on his own behalf. Cog status is improving, aware of therapy schedule 6. Diabetes mellitus with peripheral neuropathy.Controlled Hemoglobin A1c 5.7. Glipizide 5 mg daily. Check blood sugars a.c. and at bedtime. Provide diabetic teaching. No insulin for D/C 7. Hyperlipidemia. Lipitor  8. CAD with stenting. Cardiac enzymes negative. Continue aspirin Plavix therapy. No chest pain or  shortness of breath.  9. History of BPH. Check PVRs x3 10.  Left foot drop- pt gives hx of back problem causing Left foot weakness but also has had prior Right parietal CVA,  PT note states AFO made Ext rotation and leg whip worse 11. UTI-Staph with final sensitivities available, swith to levoflox 12. Poor initiation increase Ritalin, monitor HR LOS (Days) 16 A FACE TO FACE EVALUATION WAS PERFORMED  Charlett Blake 05/14/2014, 7:38 AM

## 2014-05-14 NOTE — Discharge Summary (Signed)
NAMLenna [name]:  [name], Nicholas             ACCOUNT NO.:  [id number]  MEDICAL RECORD NO.:  [id number]  LOCATION:  4W21C                        FACILITY:  [facility]  PHYSICIAN:  Erick ColaceAndrew E. Kirsteins, M.D.DATE OF BIRTH:  1951/06/30  DATE OF ADMISSION:  04/28/2014 DATE OF DISCHARGE:  05/15/2014                              DISCHARGE SUMMARY   DISCHARGE DIAGNOSES: 1. 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 2. Subcutaneous Lovenox for deep venous thrombosis prophylaxis. 3. Pain management. 4. Hypertension. 5. Diabetes mellitus with peripheral neuropathy. 6. Hyperlipidemia. 7. Coronary artery disease with stenting. 8. History of benign prostatic hypertrophy. 9. Chronic left footdrop. 10.Urinary tract infection.  HISTORY OF PRESENT ILLNESS:  This is a 63 year old right-handed male with history of hypertension, diabetes mellitus, as well as CVA in 1993 with chronic left footdrop from lumbar back injury as well as CAD with stenting.  The patient lives alone independent with a cane prior to admission.  Admitted on Apr 26, 2014, with altered mental status.  MRI with acute nonhemorrhagic left ACA territory infarct as well as remote right parietal lobe infarct.  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  Echocardiogram with ejection fraction of 55% without embolism.  Carotid Dopplers with no ICA stenosis.  Cardiac enzymes negative.  Neurology Service consulted, maintained on Plavix, aspirin therapy for CVA prophylaxis as well as subcutaneous Lovenox for DVT prophylaxis.  Physical and occupational therapy ongoing, the patient was admitted for comprehensive rehab program.  PAST MEDICAL HISTORY:  See discharge diagnoses.  SOCIAL HISTORY:  Lives alone.  FUNCTIONAL HISTORY:  Prior to admission independent with a cane.  He does not drive.  Functional  status on admission to Rehab Services was total assist, supine to sit; max assist, sit to supine; min mod assist, activities of daily living.  PHYSICAL EXAMINATION:  VITAL SIGNS:  Blood pressure 150/55, pulse 69, temperature 98, respirations 16. GENERAL:  This was an alert male, oriented x3.  Flat affect.  He did have some limited awareness and insight to his deficits, had some difficulty maintaining attention. LUNGS:  Clear to auscultation. CARDIAC:  Regular rate and rhythm. ABDOMEN:  Soft, nontender.  Good bowel sounds.  REHABILITATION HOSPITAL COURSE:  The patient was admitted to Inpatient Rehab Services with therapies initiated on a 3-hour daily basis consisting of physical therapy, occupational therapy, speech therapy, and rehabilitation nursing.  The following issues were addressed during the patient's rehabilitation stay.  Pertaining to [name]'s left ACA distribution infarct remained stable, maintained on aspirin and Plavix therapy.  The patient would follow up with Neurology Services. Subcutaneous Lovenox for DVT prophylaxis.  No bleeding episodes.  Blood pressures, heart rate remained well controlled on Cardizem, Cardura, and Lopressor.  Diabetes mellitus with peripheral neuropathy.  Hemoglobin A1c 5.7, he remained on oral agents with diabetic teaching completed. He had a history of coronary artery disease with stenting.  Cardiac enzymes negative.  He remained on aspirin and Plavix therapy.  Noted history of left footdrop from back injury many years ago wearing an AFO brace.  He was completing a course of  Levaquin for Staph urinary tract infection with no dysuria or hematuria.  Noted with patient poor initiation and attention to task  secondary CVA and attending therapies. Ritalin was added to the regimen to help better focus with good results and would follow up in the outpatient rehab office.  The patient received weekly collaborative interdisciplinary team  conferences to discuss estimated length of stay, family teaching, and any barriers to discharge.  He was ambulating with his AFO brace.  Advanced gait training with single-point cane on carpet through obstacle course of chairs without loss of balance or bumping.  Ambulating in excess of 150 feet supervision.  Activities of daily living, focused on toileting, bathing, dressing, better initiation and awareness.  He would ambulate to the toilet with a cane, close supervision.  His plan will be discharged to home where his daughter would assist as well as son-in-law and provide assistance.  Ongoing therapies dictated per [employer].  DISCHARGE MEDICATIONS: 1. Aspirin 81 mg p.o. daily. 2. Lipitor 10 mg p.o. daily. 3. Plavix 75 mg p.o. daily. 4. Cardizem CD 360 mg p.o. daily. 5. Cardura 2 mg p.o. daily. 6. Glucotrol 5 mg p.o. daily. 7. Ritalin 10 mg p.o. b.i.d. 8. Levaquin 250 mg p.o. daily, complete course for UTI. 9. Lopressor 50 mg p.o. b.i.d.  DIET:  Regular.  Diabetic restrictions.  He would follow up with Dr. Claudette Laws, [facility] 2014.  Dr. Delia Heady, Neurology Service, in 1 month call for appointment.  Dr. [name] medical management appointment to be made.  Ongoing therapies were arranged per [employer].     Mariam Dollar, P.A.   ______________________________ Erick Colace, M.D.    DA/MEDQ  D:  05/14/2014  T:  05/14/2014  Job:  [id number]  cc:   [name], MD Pramod P. Pearlean Brownie, MD

## 2014-05-15 ENCOUNTER — Encounter (HOSPITAL_COMMUNITY): Payer: Medicare Other | Admitting: Speech Pathology

## 2014-05-15 ENCOUNTER — Encounter (HOSPITAL_COMMUNITY): Payer: Medicare Other | Admitting: Occupational Therapy

## 2014-05-15 ENCOUNTER — Inpatient Hospital Stay (HOSPITAL_COMMUNITY): Payer: Medicare Other | Admitting: Physical Therapy

## 2014-05-15 DIAGNOSIS — G9340 Encephalopathy, unspecified: Secondary | ICD-10-CM

## 2014-05-15 DIAGNOSIS — I2510 Atherosclerotic heart disease of native coronary artery without angina pectoris: Secondary | ICD-10-CM

## 2014-05-15 LAB — GLUCOSE, CAPILLARY: Glucose-Capillary: 94 mg/dL (ref 70–99)

## 2014-05-15 NOTE — Progress Notes (Signed)
Patient ID: Nicholas [name], male   DOB: 02/02/1951, 63 y.o.   MRN: [id number]   05/15/14.  Patient ID: Nicholas [name], male   DOB: [birth year], 63 y.o.   MRN: [id number] 63 y.o.right handed male with history of hypertension, diabetes mellitus peripheral neuropathy, CVA 1993 with left leg weakness,CAD with stenting. Patient lives alone independent with a cane prior to admission. Admitted 04/26/2014 and altered mental status.  MRI with acute nonhemorrhagic left ACA territory infarct as well as remote right parietal lobe infarct. 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  Subjective/Complaints: Anxious for discharge today   Review of Systems - neg  Objective: Vital Signs: Blood pressure 123/75, pulse 112, temperature 97.7 F (36.5 C), temperature source Oral, resp. rate 17, height 6' 0.05" (1.83 m), weight 237 lb 6.4 oz (107.684 kg), SpO2 97.00%. No results found. Results for orders placed during the hospital encounter of 04/28/14 (from the past 72 hour(s))  GLUCOSE, CAPILLARY     Status: None   Collection Time    05/12/14 11:19 AM      Result Value Ref Range   Glucose-Capillary 84  70 - 99 mg/dL  GLUCOSE, CAPILLARY     Status: None   Collection Time    05/12/14  5:13 PM      Result Value Ref Range   Glucose-Capillary 90  70 - 99 mg/dL  GLUCOSE, CAPILLARY     Status: None   Collection Time    05/12/14  9:06 PM      Result Value Ref Range   Glucose-Capillary 74  70 - 99 mg/dL  GLUCOSE, CAPILLARY     Status: None   Collection Time    05/13/14  7:42 AM      Result Value Ref Range   Glucose-Capillary 93  70 - 99 mg/dL  GLUCOSE, CAPILLARY     Status: None   Collection Time    05/13/14 11:25 AM   Result Value Ref Range   Glucose-Capillary 94  70 - 99 mg/dL  GLUCOSE, CAPILLARY     Status: Abnormal   Collection Time    05/13/14  4:26 PM      Result Value Ref Range   Glucose-Capillary 102 (*) 70 - 99 mg/dL  GLUCOSE, CAPILLARY     Status: None   Collection Time    05/13/14  9:59 PM      Result Value Ref Range   Glucose-Capillary 88  70 - 99 mg/dL  GLUCOSE, CAPILLARY     Status: None   Collection Time    05/14/14  7:32 AM      Result Value Ref Range   Glucose-Capillary 87  70 - 99 mg/dL  GLUCOSE, CAPILLARY     Status: None   Collection Time    05/14/14 11:26 AM      Result Value Ref Range   Glucose-Capillary 82  70 - 99 mg/dL  GLUCOSE, CAPILLARY     Status: None   Collection Time    05/14/14  4:50 PM      Result Value Ref Range   Glucose-Capillary 89  70 - 99 mg/dL  GLUCOSE, CAPILLARY     Status: None   Collection Time    05/14/14  8:31 PM      Result Value  Ref Range   Glucose-Capillary 95  70 - 99 mg/dL  GLUCOSE, CAPILLARY     Status: None   Collection Time    05/15/14  7:17 AM      Result Value Ref Range   Glucose-Capillary 94  70 - 99 mg/dL      Head: Normocephalic.  Eyes:  Pupils reactive to light  Neck: Normal range of motion. Neck supple. No thyromegaly present.  Cardiovascular: Normal rate and regular rhythm.  Respiratory: Effort normal and breath sounds normal. No respiratory distress.  GI: Soft. Bowel sounds are normal. He exhibits no distension.  Neurological:  Flat affect. Morene Antu.Fair awareness of deficits  Skin: Skin is warm and dry.  Scar right side of neck  motor strength is 5/5 bilateral deltoid, bicep, tricep, grip  Minimal dysmetria bilaterally finger-nose-finger  Right lower extremity 4/5 hip flexor knee extensor ankle dorsi flexion plantar flexor  Left extremity 4/5 in the left hip flexor knee extensor 3 minus ankle dorsiflexor plantar flexor      Assessment/Plan: 1. Functional deficits secondary to embolic left ACA infarct which require 3+  hours per day of interdisciplinary therapy in a comprehensive inpatient rehab setting. Ready for D/C today  PM&R f/u in 4 wks PCP f/u 1-2 wks  Medical Problem List and Plan:  1. Functional deficits secondary to left ACA distribution infarct suspect embolic secondary to unclear source  2. DVT Prophylaxis/Anticoagulation: Subcutaneous Lovenox. Monitor platelet counts and any signs of bleeding  3. Pain Management: Tylenol as needed  4. Hypertension. Cardizem 360 mg daily, Cardura 2 mg daily, Lopressor 50 mg twice a day. Monitor with increased mobility BP on low side this am write parameters 5. Neuropsych: This patient is not yet capable of making decisions on his own behalf. Cog status is improving, aware of therapy schedule 6. Diabetes mellitus with peripheral neuropathy.Controlled Hemoglobin A1c 5.7. Glipizide 5 mg daily. Check blood sugars a.c. and at bedtime. Provide diabetic teaching. No insulin for D/C 7. Hyperlipidemia. Lipitor  8. CAD with stenting. Cardiac enzymes negative. Continue aspirin Plavix therapy. No chest pain or shortness of breath.  9. History of BPH. Check PVRs x3 10.  Left foot drop- pt gives hx of back problem causing Left foot weakness but also has had prior Right parietal CVA,  PT note states AFO made Ext rotation and leg whip worse 11. UTI-Staph with final sensitivities available, swith to levoflox 12. Poor initiation increase Ritalin, monitor HR LOS (Days) 17 A FACE TO FACE EVALUATION WAS PERFORMED  Nicholas Saverseter F Granvil Djordjevic 05/15/2014, 9:10 AM

## 2014-05-15 NOTE — Progress Notes (Signed)
Physical Therapy Session Note  Patient Details  Name: Nicholas [name] MRN: [id number] Date of Birth: [birth year]  Today's Date: 05/15/2014 Time: 2010-0712 Time Calculation (min): 10 min  Short Term Goals: Week 1:  PT Short Term Goal 1 (Week 1): pt will perform basic transfer with assistance of 1 person PT Short Term Goal 1 - Progress (Week 1): Met PT Short Term Goal 2 (Week 1): pt will propel w/c 50' in home setting with supervision PT Short Term Goal 2 - Progress (Week 1): Met PT Short Term Goal 3 (Week 1): pt will move sit> stand with mod assist PT Short Term Goal 3 - Progress (Week 1): Met PT Short Term Goal 4 (Week 1): pt will perform gait x 15' with assistance of 2 people PT Short Term Goal 4 - Progress (Week 1): Met  Skilled Therapeutic Interventions/Progress Updates:  Pt was to be seen for family education prior to discharge. Pt's daughter, Darnelle Bos, requesting to complete training as quickly as possible so pt could discharge. OT completed majority of training with pt's daughter, refer to OT note for full details. Quickly discussed, pt's brace with pt and daughter. Discussed the need to monitor need brace for any areas of concern under brace which develop while brace is being worn. Also discussed the need to elevated the LE for periods of time throughout the day to prevent edema which can alter the fit of the brace. Pt's daughter verbalized understanding and had no further questions in regards to PT education prior to discharge.   Therapy Documentation Precautions:  Precautions Precautions: Fall Precaution Comments: Pt has difficulty with reading (education not stroke related); will have difficulty with written materials Restrictions Weight Bearing Restrictions: No General:   Pain: No c/o pain.      See FIM for current functional status  Therapy/Group: Individual Therapy  Dub Amis 05/15/2014, 12:39 PM

## 2014-05-15 NOTE — Progress Notes (Signed)
Occupational Therapy Session Note  Patient Details  Name: Nicholas [name] MRN: [id number] Date of Birth: 1951-02-19  Today's Date: 05/15/2014 Time: 1000-1045 Time Calculation (min): 45 min  Skilled Therapeutic Interventions/Progress Updates:    Patient scheduled this am for OT intervention to address basic self care skills and complete family education.  Patient's family member is daughter Bard Herbert.  She did not understand that education was with each discipline (OT,PT, SLP) and planned to leave as soon as feasible.  Agreeable to stay for SLP and OT session.  Patient got up this am and showered and dressed himself without assistance.  Patient ambulating in room when this OT arrived.  Patient does not agree with need for supervision, although was able to state that he "almost fell" walking out of the bathroom.  Daughter seated in chair across the room.   Reviewed team's recommendation for incontinence pads / undergarments for home.  Ddaughter in agreement, and also plans to keep a urinal nearby at home to increase continence.   Patient and daughter instructed in recommendation for supervision and rationale.  Daughter instructed to stand beside patient in all mobility tasks, to prevent loss of balance / fall.  Reviewed the following with patient and daughte; tub transfer onto shower seat - patient needed mod cueing and demonstration to safely transfer onto tub seat.  Daughter was able to see patient's difficulty motor planning through this task.  Stairs up and down - patient without awareness for placing whole foot on stair tread, and daughter able to see, and understand need for supervision.  Transfer into and out of car - patient could complete with min cueing after demonstration.  Daughter very comfortable with this transition, and was effective in helping patient move safely.  Finally daughter indicated that her driveway was an incline and she requested practice on inclined surface.  Patient more fearful  with this activity, and needed min assist from daughter to safely walk up/down inclined surface.  Daughter able to provide needed physical assistance.  Patient and daughter both expressing desire for wheelchair.  Patient shared that he never indicated this to his primary therapist(s) and no chair was ordered.  Patient will be receiving follow up care through [facility], and further follow up regarding wheelchair may occur with this group.    Therapy Documentation Precautions:  Precautions Precautions: Fall Precaution Comments: Pt has difficulty with reading (education not stroke related); will have difficulty with written materials Restrictions Weight Bearing Restrictions: No  Pain: Pain Assessment Pain Assessment: No/denies pain ADL: ADL ADL Comments: Mod I with toileting, bathing, grooming; set up assist for dressing, supervision with toilet and shower stall transfers with shower chair    See FIM for current functional status  Therapy/Group: Individual Therapy  Collier Salina 05/15/2014, 11:07 AM

## 2014-05-15 NOTE — Progress Notes (Signed)
Patient discharged home with family and all discharge instructions about 1120. Patient given D/C instructions via Harvel Ricks PA yesterday/ Patient denied any questions. All home equipment received per family. Patient vitals stable.

## 2014-05-15 NOTE — Progress Notes (Signed)
Speech Language Pathology Daily Session Note  Patient Details  Name: Nicholas [name] MRN: [id number] Date of Birth: 1951/08/26  Today's Date: 05/15/2014 Time: 0920-0950 Time Calculation (min): 30 min  Short Term Goals: Week 2: SLP Short Term Goal 1 (Week 2): Patient will demonstrate basic problem solving during self-care tasks with Min verbal cues SLP Short Term Goal 2 (Week 2): Patient will sustain attention to basic self-care tasks for 10 minutes with Min cues SLP Short Term Goal 3 (Week 2): Patient will label 3 deficits with Mod question cues SLP Short Term Goal 4 (Week 2): Patient will follow multi-step commands during functional tasks with Min vebral cues  Skilled Therapeutic Interventions: Therapeutic intervention involving home education complete.  Patient and daughter were present for home education regarding medication administration.  Patient demonstrated needing supervision to organize weekly medication schedule.  Daughter was asked to supervise medication set-up for the week to ensure accuracy.  She voiced understanding.  Patient stated he did not want to continue with therapy, as he was ready to go home.     FIM:  Comprehension Comprehension Mode: Auditory Expression Expression Mode: Verbal Expression: 5-Expresses basic 90% of the time/requires cueing < 10% of the time. Social Interaction Social Interaction: 4-Interacts appropriately 75 - 89% of the time - Needs redirection for appropriate language or to initiate interaction. Problem Solving Problem Solving: 4-Solves basic 75 - 89% of the time/requires cueing 10 - 24% of the time Memory Memory: 3-Recognizes or recalls 50 - 74% of the time/requires cueing 25 - 49% of the time  Pain Pain Assessment Pain Assessment: No/denies pain  Therapy/Group: Individual Therapy  Kieren Adkison L Lucee Brissett 05/15/2014, 3:59 PM

## 2014-05-17 NOTE — Progress Notes (Signed)
Speech Language Pathology Discharge Summary  Patient Details  Name: Nicholas [name] MRN: [id number] Date of Birth: 12/30/1950  Today's Date: 05/17/2014  Patient has met 2 of 6 long term goals.  Patient to discharge at [facility] level.  Reasons goals not met: inconsistent progress while inpatient   Clinical Impression/Discharge Summary: Patient has made functional gains and has met 2 of 6 long term goals this admission due to improved comprehension and swallowing safety. Patient is currently an overall min assist for cognitive tasks and is independent for utilization of swallowing compensatory strategies to minimize overt s/s of aspiration with regular solids, thin liquids. Patient and family education complete and patient will discharge home with 24/7 supervision from family.  Patient would benefit from home health follow up SLP services to improve cognitive function in order to maximize his functional independence.   Care Partner:  Caregiver Able to Provide Assistance: Yes  Type of Caregiver Assistance: Physical;Cognitive  Recommendation:  24 hour supervision/assistance;Home Health SLP;Outpatient SLP  Rationale for SLP Follow Up: Maximize cognitive function and independence;Reduce caregiver burden   Equipment: none recommended by SLP   Reasons for discharge: Discharged from hospital   Patient/Family Agrees with Progress Made and Goals Achieved: Yes   See FIM for current functional status  Windell Moulding, M.A. CCC-SLP  Selinda Orion Marjory Meints 05/17/2014, 4:51 PM

## 2014-05-19 ENCOUNTER — Telehealth: Payer: Self-pay | Admitting: *Deleted

## 2014-05-19 NOTE — Telephone Encounter (Signed)
Called to request VO for Speech Therapy evaluation. Verbal ok given for evaluation.

## 2014-05-26 ENCOUNTER — Telehealth: Payer: Self-pay | Admitting: *Deleted

## 2014-05-26 NOTE — Telephone Encounter (Signed)
[facility] called to get a verbal order for a 4 wheeled rolling walker for Mr Figgs because he is unsteady on his feet.  They have faxed requests over but have not heard back.  I have given her the verbal order for the walker.

## 2014-05-28 DIAGNOSIS — I798 Other disorders of arteries, arterioles and capillaries in diseases classified elsewhere: Secondary | ICD-10-CM

## 2014-05-28 DIAGNOSIS — I69959 Hemiplegia and hemiparesis following unspecified cerebrovascular disease affecting unspecified side: Secondary | ICD-10-CM | POA: Diagnosis not present

## 2014-05-28 DIAGNOSIS — E1149 Type 2 diabetes mellitus with other diabetic neurological complication: Secondary | ICD-10-CM | POA: Diagnosis not present

## 2014-05-28 DIAGNOSIS — I10 Essential (primary) hypertension: Secondary | ICD-10-CM

## 2014-05-28 DIAGNOSIS — E1159 Type 2 diabetes mellitus with other circulatory complications: Secondary | ICD-10-CM

## 2014-05-28 DIAGNOSIS — I252 Old myocardial infarction: Secondary | ICD-10-CM

## 2014-05-28 DIAGNOSIS — M216X9 Other acquired deformities of unspecified foot: Secondary | ICD-10-CM | POA: Diagnosis not present

## 2014-05-28 DIAGNOSIS — N400 Enlarged prostate without lower urinary tract symptoms: Secondary | ICD-10-CM

## 2014-05-28 DIAGNOSIS — G909 Disorder of the autonomic nervous system, unspecified: Secondary | ICD-10-CM | POA: Diagnosis not present

## 2014-05-31 ENCOUNTER — Telehealth: Payer: Self-pay | Admitting: Neurology

## 2014-05-31 NOTE — Telephone Encounter (Signed)
[name] @ [phone], requesting an order for Glucometer and also referral to Podiatrist due to ingrown toe nail.  Please call and advise.

## 2014-06-01 NOTE — Telephone Encounter (Signed)
[facility] and let her Dr. Pearlean BrownieSethi [place] and she needs to contact his primary for those referrals.

## 2014-06-03 ENCOUNTER — Telehealth: Payer: Self-pay

## 2014-06-03 NOTE — Telephone Encounter (Signed)
Revonda StandardAllison a speech therapist with [facility] called to get verbal order to continue therapy.  Left message giving verbal order.

## 2014-06-16 ENCOUNTER — Telehealth: Payer: Self-pay

## 2014-06-16 ENCOUNTER — Telehealth: Payer: Self-pay | Admitting: *Deleted

## 2014-06-16 NOTE — Telephone Encounter (Signed)
[facility] to say Mr Nicholas FarmerMayfield is our of his ritalin. He was d/c from hospital on 05/14/14.  Will send to Dr Riley KillSwartz to see if refill appropriate.

## 2014-06-16 NOTE — Telephone Encounter (Signed)
Left voicemail on confidential voicemail for Nicholas [name] that she has approval to send SW for evaluation  For resources.

## 2014-06-16 NOTE — Telephone Encounter (Signed)
Nicholas [name] is requesting a verbal order for a Social Worker evaluation to help link patient with community resources. Attempted to [facility] [name] [facility] to give her a verbal order. No answer nor voicemail.

## 2014-06-17 MED ORDER — METHYLPHENIDATE HCL 10 MG PO TABS: 10.0000 mg | ORAL_TABLET | Freq: Two times a day (BID) | ORAL | Status: DC

## 2014-06-17 NOTE — Telephone Encounter (Signed)
This is Dr. Wynn BankerKirsteins' pt. I would assume that he wants him to continue on this medication. Is nurse noticing any difference in his arousal and attention off of the ritalin? If so, I would say continue----we can write new rx if that's the case

## 2014-06-17 NOTE — Telephone Encounter (Signed)
Nicholas [name] feels like he needs the Ritalin.  His daughter [name] will pick rx.

## 2014-06-17 NOTE — Telephone Encounter (Signed)
Left VM for Nicholas [name] to return my call in reference to the question from Dr Riley KillSwartz about differences in Mr Barretto off medication or if he is actually out of medication.

## 2014-06-18 ENCOUNTER — Telehealth: Payer: Self-pay

## 2014-06-18 NOTE — Telephone Encounter (Signed)
Allison(OT @ [facility]) is requesting a verbal order for a second OT eval. Patient is concerned about safety in the bathroom at his new residence. Verbal given to HalesiteAllison per protocol.

## 2014-06-25 ENCOUNTER — Inpatient Hospital Stay: Payer: Medicare Other | Admitting: Physical Medicine & Rehabilitation

## 2014-07-10 ENCOUNTER — Other Ambulatory Visit: Payer: Self-pay | Admitting: Physical Medicine & Rehabilitation

## 2014-07-23 ENCOUNTER — Ambulatory Visit (HOSPITAL_BASED_OUTPATIENT_CLINIC_OR_DEPARTMENT_OTHER): Payer: Medicare Other | Admitting: Physical Medicine & Rehabilitation

## 2014-07-23 ENCOUNTER — Encounter: Payer: Medicare Other | Attending: Physical Medicine & Rehabilitation

## 2014-07-23 DIAGNOSIS — I69993 Ataxia following unspecified cerebrovascular disease: Secondary | ICD-10-CM | POA: Insufficient documentation

## 2014-07-23 DIAGNOSIS — I2510 Atherosclerotic heart disease of native coronary artery without angina pectoris: Secondary | ICD-10-CM

## 2014-07-23 DIAGNOSIS — R419 Unspecified symptoms and signs involving cognitive functions and awareness: Secondary | ICD-10-CM | POA: Insufficient documentation

## 2014-07-23 DIAGNOSIS — I69919 Unspecified symptoms and signs involving cognitive functions following unspecified cerebrovascular disease: Secondary | ICD-10-CM

## 2014-07-23 MED ORDER — METHYLPHENIDATE HCL 10 MG PO TABS: 10.0000 mg | ORAL_TABLET | Freq: Two times a day (BID) | ORAL | Status: DC

## 2014-07-23 NOTE — Patient Instructions (Signed)
Will continue Ritalin for 1 more month and then discontinue. Followup in 6 weeks

## 2014-07-23 NOTE — Progress Notes (Signed)
Subjective:    Patient ID: Nicholas [name], male    DOB: 11/15/1951, 63 y.o.   MRN: [id number] Admitted on Apr 26, 2014, with altered mental status.  MRI with acute nonhemorrhagic left ACA territory infarct as well as remote right parietal lobe infarct.  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  Echocardiogram with ejection fraction of 55% without embolism.  Carotid Dopplers with no ICA stenosis.  Cardiac enzymes negative.  Neurology Service consulted, maintained on Plavix,  DATE OF ADMISSION:  04/28/2014 DATE OF DISCHARGE:  05/15/2014  HPI Has followed up with primary care physician Dr. Jeanella Antoneece since discharge on 2 occasions  Patient stayed with his daughter per month after hospitalization and now has returned home  Pain Inventory Average Pain 6 Pain Right Now 0 My pain is intermittent and sharp  In the last 24 hours, has pain interfered with the following? General activity 2 Relation with others 0 Enjoyment of life 2 What TIME of day is your pain at its worst? morning Sleep (in general) Fair  Pain is worse with: inactivity and standing Pain improves with: pacing activities Relief from Meds: no meds  Mobility walk with assistance use a walker how many minutes can you walk? 30 ability to climb steps?  no do you drive?  no  Function disabled: date disabled .  Neuro/Psych weakness trouble walking spasms  Prior Studies Any changes since last visit?  no  Physicians involved in your care Any changes since last visit?  no   Family History  Problem Relation Age of Onset  . Stroke Mother   . Hypertension Mother   . Hypertension Father    History   Social History  . Marital Status: Legally Separated    Spouse Name: N/A    Number of Children: N/A  . Years of Education: N/A   Social History Main Topics  . Smoking status: Former Smoker -- 0 years    Types:  Cigarettes    Quit date: 10/26/1993  . Smokeless tobacco: Never Used  . Alcohol Use: No  . Drug Use: No  . Sexual Activity: Not on file   Other Topics Concern  . Not on file   Social History Narrative  . No narrative on file   Past Surgical History  Procedure Laterality Date  . Carotid endarterectomy Right 05-31-2003  . Femoral-popliteal bypass graft Left 01-19-2003  . Right hydrocelectomy  05-31-2003  . Coronary angioplasty  1993    PCI TO LAD  . Cardiac catheterization  01-18-2003   DR NISHAN    NON-OBSTRUCTIVE CAD/  PREVIOIS ANGIOPLASTY SITE WIDELY PATENT  . Cardiovascular stress test  2007    SMALL ANTERO-APICAL SCAR/  NO ISCHEMIA  . Transthoracic echocardiogram  08-24-2010    EF 55%/  MILD AORTIC INSUFFICENCY/  MODERATE LVH  . Hydrocele excision Left 10/27/2013    Procedure: HYDROCELECTOMY ADULT;  Surgeon: Lindaann SloughMarc-Henry Nesi, MD;  [facility];  Service: Urology;  Laterality: Left;   Past Medical History  Diagnosis Date  . Hypertension   . Hyperlipidemia   . Type 2 diabetes mellitus   . History of myocardial infarction     1993--  NON-Q WAVE  S/P PCI TO LAD  . History of CVA (cerebrovascular accident)     1993---  NO RESIDUALS  . Hydrocele, left   . BPH (benign prostatic hypertrophy)     HX RETENTION  .  PSVT (paroxysmal supraventricular tachycardia)   . Coronary artery disease CARDIOLOGIST--  DR Johney Frame    S/P  PCI TO LAD 1993  . Carotid artery occlusion     S/P RIGHT CEA  . PVD (peripheral vascular disease)     S/P LEFT FEM-POP  . Left carotid artery stenosis     MILD  . History of head injury     CONCUSSION--  NO RESIDUAL  . Right leg weakness     USES CANE--  SECONDARY TO PVD  . At high risk for falls   . Wears glasses   . Myocardial infarction   . GERD (gastroesophageal reflux disease)     vitals : 104/33 BP P 64 R 14   Opioid Risk Score:   Fall Risk Score:   Review of Systems  Gastrointestinal: Positive for constipation.    Musculoskeletal: Positive for gait problem.       Spasms  All other systems reviewed and are negative.      Objective:   Physical Exam Scar right side of neck  motor strength is 5/5 bilateral deltoid, bicep, tricep, grip  Minimal dysmetria bilaterally finger-nose-finger  Right lower extremity 5/5 hip flexor knee extensor ankle dorsi flexion plantar flexor  Left extremity 4/5 in the left hip flexor knee extensor 3 minus ankle dorsiflexor plantar flexor         Assessment & Plan:  1.  left ACA infarct causing gait imbalance as well as decreased initiation. Not back to his baseline yet. Recommend outpatient PT and speech therapy 2. 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

## 2014-07-23 NOTE — Progress Notes (Unsigned)
° °  Subjective:    Patient ID: Nicholas [name], male    DOB: 04/13/1951, 63 y.o.   MRN: [id number]  HPI    Review of Systems  Gastrointestinal: Positive for constipation.  All other systems reviewed and are negative.      Objective:   Physical Exam        Assessment & Plan:

## 2014-08-07 ENCOUNTER — Other Ambulatory Visit: Payer: Self-pay | Admitting: Physical Medicine & Rehabilitation

## 2014-09-06 ENCOUNTER — Encounter: Payer: Medicare Other | Attending: Physical Medicine & Rehabilitation

## 2014-09-06 ENCOUNTER — Ambulatory Visit: Payer: Medicare Other | Admitting: Physical Medicine & Rehabilitation

## 2014-09-06 DIAGNOSIS — I69993 Ataxia following unspecified cerebrovascular disease: Secondary | ICD-10-CM | POA: Insufficient documentation

## 2014-09-06 DIAGNOSIS — I69919 Unspecified symptoms and signs involving cognitive functions following unspecified cerebrovascular disease: Secondary | ICD-10-CM | POA: Insufficient documentation

## 2014-10-03 ENCOUNTER — Emergency Department (HOSPITAL_COMMUNITY): Payer: Medicare Other

## 2014-10-03 ENCOUNTER — Emergency Department (HOSPITAL_COMMUNITY)
Admission: EM | Admit: 2014-10-03 | Discharge: 2014-10-04 | Discharge status: Against Medical Advice | Payer: Medicare Other | Attending: Emergency Medicine | Admitting: Emergency Medicine

## 2014-10-03 ENCOUNTER — Encounter (HOSPITAL_COMMUNITY): Payer: Self-pay | Admitting: Emergency Medicine

## 2014-10-03 DIAGNOSIS — G459 Transient cerebral ischemic attack, unspecified: Secondary | ICD-10-CM

## 2014-10-03 DIAGNOSIS — Z791 Long term (current) use of non-steroidal anti-inflammatories (NSAID): Secondary | ICD-10-CM | POA: Diagnosis not present

## 2014-10-03 DIAGNOSIS — E119 Type 2 diabetes mellitus without complications: Secondary | ICD-10-CM | POA: Insufficient documentation

## 2014-10-03 DIAGNOSIS — Z9861 Coronary angioplasty status: Secondary | ICD-10-CM | POA: Diagnosis not present

## 2014-10-03 DIAGNOSIS — Z7982 Long term (current) use of aspirin: Secondary | ICD-10-CM | POA: Insufficient documentation

## 2014-10-03 DIAGNOSIS — E785 Hyperlipidemia, unspecified: Secondary | ICD-10-CM | POA: Insufficient documentation

## 2014-10-03 DIAGNOSIS — Z8719 Personal history of other diseases of the digestive system: Secondary | ICD-10-CM | POA: Diagnosis not present

## 2014-10-03 DIAGNOSIS — Z87891 Personal history of nicotine dependence: Secondary | ICD-10-CM | POA: Diagnosis not present

## 2014-10-03 DIAGNOSIS — Z7902 Long term (current) use of antithrombotics/antiplatelets: Secondary | ICD-10-CM | POA: Insufficient documentation

## 2014-10-03 DIAGNOSIS — N400 Enlarged prostate without lower urinary tract symptoms: Secondary | ICD-10-CM | POA: Insufficient documentation

## 2014-10-03 DIAGNOSIS — Z79899 Other long term (current) drug therapy: Secondary | ICD-10-CM | POA: Insufficient documentation

## 2014-10-03 DIAGNOSIS — I252 Old myocardial infarction: Secondary | ICD-10-CM | POA: Diagnosis not present

## 2014-10-03 DIAGNOSIS — I10 Essential (primary) hypertension: Secondary | ICD-10-CM | POA: Diagnosis not present

## 2014-10-03 DIAGNOSIS — I2510 Atherosclerotic heart disease of native coronary artery without angina pectoris: Secondary | ICD-10-CM | POA: Insufficient documentation

## 2014-10-03 DIAGNOSIS — I471 Supraventricular tachycardia: Secondary | ICD-10-CM | POA: Insufficient documentation

## 2014-10-03 DIAGNOSIS — Z9181 History of falling: Secondary | ICD-10-CM | POA: Diagnosis not present

## 2014-10-03 DIAGNOSIS — I639 Cerebral infarction, unspecified: Secondary | ICD-10-CM | POA: Diagnosis present

## 2014-10-03 LAB — I-STAT CHEM 8, ED
BUN: 18 mg/dL (ref 6–23)
CHLORIDE: 103 meq/L (ref 96–112)
Calcium, Ion: 1.16 mmol/L (ref 1.13–1.30)
Creatinine, Ser: 1.1 mg/dL (ref 0.50–1.35)
Glucose, Bld: 207 mg/dL — ABNORMAL HIGH (ref 70–99)
HEMATOCRIT: 46 % (ref 39.0–52.0)
Hemoglobin: 15.6 g/dL (ref 13.0–17.0)
POTASSIUM: 4.3 meq/L (ref 3.7–5.3)
Sodium: 140 mEq/L (ref 137–147)
TCO2: 25 mmol/L (ref 0–100)

## 2014-10-03 LAB — DIFFERENTIAL
BASOS PCT: 0 % (ref 0–1)
Basophils Absolute: 0 10*3/uL (ref 0.0–0.1)
Eosinophils Absolute: 0.3 10*3/uL (ref 0.0–0.7)
Eosinophils Relative: 5 % (ref 0–5)
Lymphocytes Relative: 23 % (ref 12–46)
Lymphs Abs: 1.6 10*3/uL (ref 0.7–4.0)
MONO ABS: 0.7 10*3/uL (ref 0.1–1.0)
Monocytes Relative: 10 % (ref 3–12)
Neutro Abs: 4.3 10*3/uL (ref 1.7–7.7)
Neutrophils Relative %: 62 % (ref 43–77)

## 2014-10-03 LAB — I-STAT TROPONIN, ED: TROPONIN I, POC: 0.01 ng/mL (ref 0.00–0.08)

## 2014-10-03 LAB — PROTIME-INR
INR: 1.18 (ref 0.00–1.49)
Prothrombin Time: 15.1 seconds (ref 11.6–15.2)

## 2014-10-03 LAB — CBC
HEMATOCRIT: 40.2 % (ref 39.0–52.0)
Hemoglobin: 14.1 g/dL (ref 13.0–17.0)
MCH: 34.1 pg — ABNORMAL HIGH (ref 26.0–34.0)
MCHC: 35.1 g/dL (ref 30.0–36.0)
MCV: 97.3 fL (ref 78.0–100.0)
PLATELETS: 318 10*3/uL (ref 150–400)
RBC: 4.13 MIL/uL — ABNORMAL LOW (ref 4.22–5.81)
RDW: 12.3 % (ref 11.5–15.5)
WBC: 6.9 10*3/uL (ref 4.0–10.5)

## 2014-10-03 LAB — APTT: aPTT: 28 seconds (ref 24–37)

## 2014-10-03 NOTE — ED Notes (Signed)
Pt arrived by private vehicle followed by EMS. Pt called daughter at 2200 stating to daughter that he thinks he is having a stroke. Pt refused EMS transport, daughter drove pt to ED. EMS reports grips in left arm, able to move left arm if asked to but would not use left arm to perform task. Pt presents with inattention to left side. Pt is A&Ox4, respirations equal and unlabored, skin warm and dry, speech is clear.

## 2014-10-03 NOTE — Code Documentation (Signed)
63 yo bm arrived via pvt vehicle after he called his dtr and said he thought he was having a stroke.  On assessment he had LUE weakness and inattention but if given commands could use the LUE.  He refuses to be admitted to the hospital and refused to allow EMS to bring him here.  NIH 5.

## 2014-10-03 NOTE — ED Provider Notes (Signed)
CSN: [id number]     Arrival date & time 10/03/14  2319 History   First MD Initiated Contact with Patient 10/03/14 2323     No chief complaint on file.    (Consider location/radiation/quality/duration/timing/severity/associated sxs/prior Treatment) HPI Nicholas [name] is a 63 y.o. male with past medical history of CVA with left-sided deficit, hypertension, hyperlipidemia, diabetes, coronary disease, peripheral vascular disease, right carotid artery occlusion status post CEA presenting with strokelike symptoms. Patient states he was last normal around 8 PM. Daughter also confirms this story. He states this feels like his prior stroke. He began having left-sided weakness and denies any other neurological symptoms.  He denies any fevers, recent infections, coughing or changes in his bowel or bladder.  Patient has no further complaints.    10 Systems reviewed and are negative for acute change except as noted in the HPI.     Past Medical History  Diagnosis Date  . Hypertension   . Hyperlipidemia   . Type 2 diabetes mellitus   . History of myocardial infarction     1993--  NON-Q WAVE  S/P PCI TO LAD  . History of CVA (cerebrovascular accident)     1993---  NO RESIDUALS  . Hydrocele, left   . BPH (benign prostatic hypertrophy)     HX RETENTION  . PSVT (paroxysmal supraventricular tachycardia)   . Coronary artery disease CARDIOLOGIST--  DR Johney FrameALLRED    S/P  PCI TO LAD 1993  . Carotid artery occlusion     S/P RIGHT CEA  . PVD (peripheral vascular disease)     S/P LEFT FEM-POP  . Left carotid artery stenosis     MILD  . History of head injury     CONCUSSION--  NO RESIDUAL  . Right leg weakness     USES CANE--  SECONDARY TO PVD  . At high risk for falls   . Wears glasses   . Myocardial infarction   . GERD (gastroesophageal reflux disease)    Past Surgical History  Procedure Laterality Date  . Carotid endarterectomy Right 05-31-2003  . Femoral-popliteal bypass graft Left  01-19-2003  . Right hydrocelectomy  05-31-2003  . Coronary angioplasty  1993    PCI TO LAD  . Cardiac catheterization  01-18-2003   DR NISHAN    NON-OBSTRUCTIVE CAD/  PREVIOIS ANGIOPLASTY SITE WIDELY PATENT  . Cardiovascular stress test  2007    SMALL ANTERO-APICAL SCAR/  NO ISCHEMIA  . Transthoracic echocardiogram  08-24-2010    EF 55%/  MILD AORTIC INSUFFICENCY/  MODERATE LVH  . Hydrocele excision Left 10/27/2013    Procedure: HYDROCELECTOMY ADULT;  Surgeon: Lindaann SloughMarc-Henry Nesi, MD;  [facility];  Service: Urology;  Laterality: Left;   Family History  Problem Relation Age of Onset  . Stroke Mother   . Hypertension Mother   . Hypertension Father    History  Substance Use Topics  . Smoking status: Former Smoker -- 0 years    Types: Cigarettes    Quit date: 10/26/1993  . Smokeless tobacco: Never Used  . Alcohol Use: No    Review of Systems    Allergies  Review of patient's allergies indicates no known allergies.  Home Medications   Prior to Admission medications   Medication Sig Start Date End Date Taking? Authorizing Provider  aspirin 81 MG EC tablet Take 1 tablet (81 mg total) by mouth daily. Take Aspirin and plavix for 3 months, then plavix alone indefintely 05/14/14   Charlton Amoraniel J Angiulli, PA-C  clopidogrel (PLAVIX) 75 MG tablet Take 1 tablet (75 mg total) by mouth daily with breakfast. 05/14/14   Mcarthur Rossetti Angiulli, PA-C  diltiazem (TAZTIA XT) 360 MG 24 hr capsule Take 1 capsule (360 mg total) by mouth daily. 05/14/14   Mcarthur Rossetti Angiulli, PA-C  doxazosin (CARDURA) 2 MG tablet Take 1 tablet (2 mg total) by mouth daily. 05/14/14   Mcarthur Rossetti Angiulli, PA-C  glipiZIDE (GLUCOTROL XL) 5 MG 24 hr tablet Take 1 tablet (5 mg total) by mouth daily. 05/14/14   Mcarthur Rossetti Angiulli, PA-C  levofloxacin (LEVAQUIN) 250 MG tablet Take 1 tablet (250 mg total) by mouth daily. 05/14/14   Mcarthur Rossetti Angiulli, PA-C  methylphenidate (RITALIN) 10 MG tablet Take 1 tablet (10 mg total) by  mouth 2 (two) times daily with breakfast and lunch. 07/23/14   Erick Colace, MD  metoprolol (LOPRESSOR) 50 MG tablet Take 1 tablet (50 mg total) by mouth 2 (two) times daily. 05/14/14   Mcarthur Rossetti Angiulli, PA-C  rosuvastatin (CRESTOR) 10 MG tablet Take 1 tablet (10 mg total) by mouth daily. 05/14/14   Mcarthur Rossetti Angiulli, PA-C   There were no vitals taken for this visit. Physical Exam  Nursing note and vitals reviewed. Constitutional: He is oriented to person, place, and time. Vital signs are normal. He appears well-developed and well-nourished.  Non-toxic appearance. He does not appear ill. No distress.  HENT:  Head: Normocephalic and atraumatic.  Nose: Nose normal.  Mouth/Throat: Oropharynx is clear and moist. No oropharyngeal exudate.  Eyes: Conjunctivae and EOM are normal. Pupils are equal, round, and reactive to light. No scleral icterus.  Neck: Normal range of motion. Neck supple. No tracheal deviation, no edema, no erythema and normal range of motion present. No mass and no thyromegaly present.  Cardiovascular: Normal rate, regular rhythm, S1 normal, S2 normal, normal heart sounds, intact distal pulses and normal pulses.  Exam reveals no gallop and no friction rub.   No murmur heard. Pulses:      Radial pulses are 2+ on the right side, and 2+ on the left side.       Dorsalis pedis pulses are 2+ on the right side, and 2+ on the left side.  Pulmonary/Chest: Effort normal and breath sounds normal. No respiratory distress. He has no wheezes. He has no rhonchi. He has no rales.  Abdominal: Soft. Normal appearance and bowel sounds are normal. He exhibits no distension, no ascites and no mass. There is no hepatosplenomegaly. There is no tenderness. There is no rebound, no guarding and no CVA tenderness.  Musculoskeletal: Normal range of motion. He exhibits no edema and no tenderness.  Lymphadenopathy:    He has no cervical adenopathy.  Neurological: He is alert and oriented to person, place,  and time. He has normal strength. No sensory deficit. He exhibits abnormal muscle tone. Coordination abnormal. GCS eye subscore is 4. GCS verbal subscore is 5. GCS motor subscore is 6.  Mild facial droop noted. Patient has 3 out of 5 strength of the left upper extremity, 5 out of 5 strength in the right upper extremity, normal sensation bilateral upper extremities. Patient is to have 5 strength left lower extremity, 5 out of 5 strength right lower extremity. Sensation of the lower studies. Bedside cerebellar testing is normal.  Skin: Skin is warm, dry and intact. No petechiae and no rash noted. He is not diaphoretic. No erythema. No pallor.  Psychiatric: He has a normal mood and affect. His behavior is normal. Judgment  normal.    ED Course  Procedures (including critical care time) Labs Review Labs Reviewed  CBC - Abnormal; Notable for the following:    RBC 4.13 (*)    MCH 34.1 (*)    All other components within normal limits  COMPREHENSIVE METABOLIC PANEL - Abnormal; Notable for the following:    Glucose, Bld 204 (*)    GFR calc non Af Amer 61 (*)    GFR calc Af Amer 70 (*)    All other components within normal limits  I-STAT CHEM 8, ED - Abnormal; Notable for the following:    Glucose, Bld 207 (*)    All other components within normal limits  PROTIME-INR  APTT  DIFFERENTIAL  I-STAT TROPOININ, ED    Imaging Review Ct Head (brain) Wo Contrast  10/03/2014   CLINICAL DATA:  Code stroke for left-sided weakness and left-sided leg swelling. Initial encounter  EXAM: CT HEAD WITHOUT CONTRAST  TECHNIQUE: Contiguous axial images were obtained from the base of the skull through the vertex without intravenous contrast.  COMPARISON:  04/26/2014  FINDINGS: Skull and Sinuses:Negative for fracture or destructive process.  There is mild inflammatory mucosal thickening in the paranasal sinuses. No sinus effusion.  Orbits: No acute abnormality.  Brain: No evidence of acute abnormality, such as acute  infarction, hemorrhage, hydrocephalus, or mass lesion/mass effect.  Expected evolution of a small left ACA territory infarct noted 04/26/2014. Remote right parietal and high posterior frontal infarct, likely posterior watershed in this patient with right ICA occlusion noted on brain MRI 04/26/2014.  Critical Value/emergent results were called by telephone at the time of interpretation on 10/03/2014 at 11:37 pm to Dr. Tomasita CrumbleADELEKE Edvin Albus , who verbally acknowledged these results.  IMPRESSION: 1. No intracranial hemorrhage or visible infarct. 2. Remote posterior watershed infarct on the right. 3. Remote left ACA territory infarct.   Electronically Signed   By: Tiburcio PeaJonathan  Watts M.D.   On: 10/03/2014 23:41     EKG Interpretation None     MUSE not working: NSR, rate 81, poor r wave progression, normal intervals MDM   Final diagnoses:  None    Patient present emergency department for concern of strokelike symptoms beginning approximately 3 hours ago. Code stroke was called, and was assessed and patient was sent immediately to CT scan. I believe TPA can be considered in this patient, awaiting neurological consultation.  Dr. Roseanne RenoStewart with neurology agrees the patient needs admission, however he is now outside the window and will need MRI in the morning. Patient is requesting to leave AGAINST MEDICAL ADVICE. He states his left upper extremity is improving which I agree with physical exam. He is high risk with history of stroke, hypertension, diabetes, high cholesterol and he was told his risk of large debilitating stroke can occur as 24 hours she has had a TIA today. Patient states understanding and has decision-making capacity. He agrees to wait for laboratory results and will be discharged AGAINST MEDICAL ADVICE. His signs remained within his normal limits.    CRITICAL CARE Performed by: Tomasita CrumbleNI,Doral Digangi   Total critical care time: 40 min  Critical care time was exclusive of separately billable procedures and  treating other patients.  Critical care was necessary to treat or prevent imminent or life-threatening deterioration.  Critical care was time spent personally by me on the following activities: development of treatment plan with patient and/or surrogate as well as nursing, discussions with consultants, evaluation of patient's response to treatment, examination of patient, obtaining history from patient or  surrogate, ordering and performing treatments and interventions, ordering and review of laboratory studies, ordering and review of radiographic studies, pulse oximetry and re-evaluation of patient's condition.   Tomasita Crumble, MD 10/04/14 [phone]

## 2014-10-04 DIAGNOSIS — I10 Essential (primary) hypertension: Secondary | ICD-10-CM

## 2014-10-04 DIAGNOSIS — G459 Transient cerebral ischemic attack, unspecified: Secondary | ICD-10-CM

## 2014-10-04 LAB — COMPREHENSIVE METABOLIC PANEL
ALBUMIN: 4 g/dL (ref 3.5–5.2)
ALT: 23 U/L (ref 0–53)
AST: 24 U/L (ref 0–37)
Alkaline Phosphatase: 64 U/L (ref 39–117)
Anion gap: 13 (ref 5–15)
BUN: 17 mg/dL (ref 6–23)
CALCIUM: 9.5 mg/dL (ref 8.4–10.5)
CO2: 25 mEq/L (ref 19–32)
CREATININE: 1.23 mg/dL (ref 0.50–1.35)
Chloride: 100 mEq/L (ref 96–112)
GFR calc Af Amer: 70 mL/min — ABNORMAL LOW (ref >90)
GFR calc non Af Amer: 61 mL/min — ABNORMAL LOW (ref >90)
Glucose, Bld: 204 mg/dL — ABNORMAL HIGH (ref 70–99)
Potassium: 4.4 mEq/L (ref 3.7–5.3)
SODIUM: 138 meq/L (ref 137–147)
Total Bilirubin: 0.4 mg/dL (ref 0.3–1.2)
Total Protein: 7.5 g/dL (ref 6.0–8.3)

## 2014-10-04 NOTE — ED Notes (Addendum)
Pt was informed that he would be admitted by neurologist. Pt is refusing admission because he doesn't like the hospital beds. Pt was informed he may be having another stroke or will have a large one soon. Pt was encouraged to stay by stroke team. Pt continues to refuses admission.

## 2014-10-04 NOTE — Consult Note (Signed)
Referring Physician: Mora BellmanNI, A    Chief Complaint: New onset left arm weakness and numbness.  HPI: Nicholas [name] is an 63 y.o. male history diabetes mellitus, hypertension, hyperlipidemia, previous cerebral infarctions, coronary artery disease and left carotid stenosis, presenting with new onset weakness and numbness involving left upper extremity. Patient was last known well at 8:30 PM. He's been taking aspirin daily. CT scan of his head showed old left PCA and right posterior watershed infarctions. There were no acute findings. NIH stroke score was 5. Patient's deficits improved after arriving in the emergency room.  LSN: 8:30 PM on 10/03/2014 tPA Given: No: Deficits rapidly improving mRankin:  Past Medical History  Diagnosis Date  . Hypertension   . Hyperlipidemia   . Type 2 diabetes mellitus   . History of myocardial infarction     1993--  NON-Q WAVE  S/P PCI TO LAD  . History of CVA (cerebrovascular accident)     1993---  NO RESIDUALS  . Hydrocele, left   . BPH (benign prostatic hypertrophy)     HX RETENTION  . PSVT (paroxysmal supraventricular tachycardia)   . Coronary artery disease CARDIOLOGIST--  DR Johney FrameALLRED    S/P  PCI TO LAD 1993  . Carotid artery occlusion     S/P RIGHT CEA  . PVD (peripheral vascular disease)     S/P LEFT FEM-POP  . Left carotid artery stenosis     MILD  . History of head injury     CONCUSSION--  NO RESIDUAL  . Right leg weakness     USES CANE--  SECONDARY TO PVD  . At high risk for falls   . Wears glasses   . Myocardial infarction   . GERD (gastroesophageal reflux disease)     Family History  Problem Relation Age of Onset  . Stroke Mother   . Hypertension Mother   . Hypertension Father      Medications: I have reviewed the patient's current medications.  ROS: History obtained from the patient and patient's daughter  General ROS: negative for - chills, fatigue, fever, night sweats, weight gain or weight loss Psychological ROS:  negative for - behavioral disorder, hallucinations, memory difficulties, mood swings or suicidal ideation Ophthalmic ROS: negative for - blurry vision, double vision, eye pain or loss of vision ENT ROS: negative for - epistaxis, nasal discharge, oral lesions, sore throat, tinnitus or vertigo Allergy and Immunology ROS: negative for - hives or itchy/watery eyes Hematological and Lymphatic ROS: negative for - bleeding problems, bruising or swollen lymph nodes Endocrine ROS: negative for - galactorrhea, hair pattern changes, polydipsia/polyuria or temperature intolerance Respiratory ROS: negative for - cough, hemoptysis, shortness of breath or wheezing Cardiovascular ROS: negative for - chest pain, dyspnea on exertion, edema or irregular heartbeat Gastrointestinal ROS: negative for - abdominal pain, diarrhea, hematemesis, nausea/vomiting or stool incontinence Genito-Urinary ROS: negative for - dysuria, hematuria, incontinence or urinary frequency/urgency Musculoskeletal ROS: negative for - joint swelling or muscular weakness Neurological ROS: as noted in HPI Dermatological ROS: negative for rash and skin lesion changes   Physical Examination: Blood pressure 160/51, pulse 83, temperature 98.1 F (36.7 C), temperature source Oral, resp. rate 23, height 5\' 11"  (1.803 m), weight 112.299 kg (247 lb 9.2 oz), SpO2 97.00%.  Neurologic Examination: Mental Status: Alert, oriented, thought content appropriate.  Speech fluent without evidence of aphasia. Able to follow commands without difficulty. Cranial Nerves: II-Visual fields were normal. III/IV/VI-Pupils were equal and reacted. Extraocular movements were full and conjugate.    V/VII-no  facial numbness and no facial weakness. VIII-normal. X-normal speech. Motor: Drift of left upper and lower extremities; normal strength of right extremities. Sensory: Reduced perception of tactile sensation over left extremities compared to right. Deep Tendon  Reflexes: 2+ and symmetric. Plantars: Flexor bilaterally Cerebellar: Slightly impaired coordination of left upper extremity.  Ct Head (brain) Wo Contrast  10/03/2014   CLINICAL DATA:  Code stroke for left-sided weakness and left-sided leg swelling. Initial encounter  EXAM: CT HEAD WITHOUT CONTRAST  TECHNIQUE: Contiguous axial images were obtained from the base of the skull through the vertex without intravenous contrast.  COMPARISON:  04/26/2014  FINDINGS: Skull and Sinuses:Negative for fracture or destructive process.  There is mild inflammatory mucosal thickening in the paranasal sinuses. No sinus effusion.  Orbits: No acute abnormality.  Brain: No evidence of acute abnormality, such as acute infarction, hemorrhage, hydrocephalus, or mass lesion/mass effect.  Expected evolution of a small left ACA territory infarct noted 04/26/2014. Remote right parietal and high posterior frontal infarct, likely posterior watershed in this patient with right ICA occlusion noted on brain MRI 04/26/2014.  Critical Value/emergent results were called by telephone at the time of interpretation on 10/03/2014 at 11:37 pm to Dr. Tomasita CrumbleADELEKE ONI , who verbally acknowledged these results.  IMPRESSION: 1. No intracranial hemorrhage or visible infarct. 2. Remote posterior watershed infarct on the right. 3. Remote left ACA territory infarct.   Electronically Signed   By: Tiburcio PeaJonathan  Watts M.D.   On: 10/03/2014 23:41    Assessment: 63 y.o. male with multiple risk factors for stroke as well as previous cerebral infarctions presenting with probable recurrent right MCA territory subcortical ischemic cerebral infarction.  Stroke Risk Factors - diabetes mellitus, family history, hyperlipidemia and hypertension  Plan: 1. HgbA1c 2. MRI, MRA  of the brain without contrast 3. PT consult, OT consult 4. Carotid dopplers 5. Prophylactic therapy-Antiplatelet med: Aspirin  6. Risk factor modification 7. Telemetry monitoring   C.R. Roseanne RenoStewart,  MD [facility] [phone]  10/04/2014, 12:28 AM

## 2014-10-04 NOTE — Discharge Instructions (Signed)
Transient Ischemic Attack Nicholas [name], you were seen today after having a stroke. You're leaving AGAINST MEDICAL ADVICE. We advised you to return to the hospital for immediate admission. You are risk of having a large debilitating stroke or the next 24 hours. Follow-up with her primary care physician immediately for continued treatment. Thank you. A transient ischemic attack (TIA) is a "warning stroke" that causes stroke-like symptoms. A TIA does not cause lasting damage to the brain. It is important to know when to get help and what to do to prevent stroke or death.  HOME CARE   Take all medicines exactly as told by your doctor. Understand all your medicine instructions.  You may need to take aspirin or warfarin medicine. Take warfarin exactly as told.  Taking too much or too little warfarin is dangerous. Blood tests must be done as often as told by your doctor. These blood tests help your doctor make sure the amount of warfarin you are taking is right. A PT blood test measures how long it takes for blood to clot. Your PT is used to calculate another value called an INR. Your PT and INR help your doctor adjust your warfarin dosage.  Food can cause problems with warfarin and affect the results of your blood tests. This is true for foods high in vitamin K. Spinach, kale, broccoli, cabbage, collard and turnip greens, Brussels sprouts, peas, cauliflower, seaweed, and parsley are high in vitamin K as well as beef and pork liver, green tea, and soybean oil. Eat the same amount of food high in vitamin K. Avoid major changes in your diet. Tell your doctor before changing your diet. Talk to a food specialist (dietitian) if you have questions.  Many medicines can cause problems with warfarin and affect your PT and INR. Tell your doctor about all medicines you take. This includes vitamins and dietary pills (supplements). Be careful with aspirin and medicines that relieve redness, soreness, and puffiness  (inflammation). Do not take or stop medicines unless your doctor tells you to.  Warfarin can cause a lot of bruising or bleeding. Hold pressure over cuts for longer than normal. Talk to your doctor about other side effects of warfarin.  Avoid sports or activities that may cause injury or bleeding.  Be careful when you shave, floss your teeth, or use sharp objects.  Avoid alcoholic drinks or drink very little alcohol while taking warfarin. Tell your doctor if you change how much alcohol you drink.  Tell your dentist and other doctors that you take warfarin before procedures.  Eat 5 or more servings of fruits and vegetables a day.  Follow your diet program as told, if you are given one.  Keep a healthy weight.  Stay active. Try to get at least 30 minutes of activity on most or all days.  Do not smoke.  Limit how much alcohol you drink even if you are not taking warfarin. Moderate alcohol use is:  No more than 2 drinks each day for men.  No more than 1 drink each day for women who are not pregnant.  Stop abusing drugs.  Keep your home safe so you do not fall. Try:  Putting grab bars in the bedroom and bathroom.  Raising toilet seats.  Putting a seat in the shower.  Keep all doctor visits a told. GET HELP IF:  Your personality changes.  You have trouble swallowing.  You are seeing two of everything.  You are dizzy.  You have a fever.  Your  skin starts to break down. GET HELP RIGHT AWAY IF:  The symptoms below may be a sign of an emergency. Do not wait to see if the symptoms go away. Call for help (911 in [place] Do not drive yourself to the hospital.  You have sudden weakness or numbness on the face, arm, or leg (especially on one side of the body).  You have sudden trouble walking or moving your arms or legs.  You have sudden confusion.  You have trouble talking or understanding.  You have sudden trouble seeing in one or both eyes.  You lose your balance  or your movements are not smooth.  You have a sudden, severe headache with no known cause.  You have new chest pain or you feel your heart beating in a unsteady way.  You are partly or totally unaware of what is going on around you. MAKE SURE YOU:   Understand these instructions.  Will watch your condition.  Will get help right away if you are not doing well or get worse. Document Released: 09/04/2008 Document Revised: 04/12/2014 Document Reviewed: 03/03/2014 [facility] Patient Information 2015 [employer] [place], [place]. This information is not intended to replace advice given to you by your health care provider. Make sure you discuss any questions you have with your health care provider.

## 2014-10-04 NOTE — ED Notes (Signed)
Pt up to the bathroom with walker provided by ED assisted by NT

## 2015-05-06 ENCOUNTER — Other Ambulatory Visit: Payer: Self-pay | Admitting: Physical Medicine & Rehabilitation

## 2015-10-09 ENCOUNTER — Encounter (HOSPITAL_COMMUNITY): Payer: Self-pay

## 2015-10-09 ENCOUNTER — Emergency Department (HOSPITAL_COMMUNITY)
Admission: EM | Admit: 2015-10-09 | Discharge: 2015-10-09 | Disposition: A | Discharge status: Home / Self Care | Payer: Medicare Other | Attending: Emergency Medicine | Admitting: Emergency Medicine

## 2015-10-09 ENCOUNTER — Emergency Department (HOSPITAL_COMMUNITY): Payer: Medicare Other

## 2015-10-09 DIAGNOSIS — E119 Type 2 diabetes mellitus without complications: Secondary | ICD-10-CM | POA: Diagnosis not present

## 2015-10-09 DIAGNOSIS — I2510 Atherosclerotic heart disease of native coronary artery without angina pectoris: Secondary | ICD-10-CM | POA: Insufficient documentation

## 2015-10-09 DIAGNOSIS — K649 Unspecified hemorrhoids: Secondary | ICD-10-CM

## 2015-10-09 DIAGNOSIS — Z9861 Coronary angioplasty status: Secondary | ICD-10-CM | POA: Insufficient documentation

## 2015-10-09 DIAGNOSIS — I252 Old myocardial infarction: Secondary | ICD-10-CM | POA: Diagnosis not present

## 2015-10-09 DIAGNOSIS — Z9889 Other specified postprocedural states: Secondary | ICD-10-CM | POA: Diagnosis not present

## 2015-10-09 DIAGNOSIS — I471 Supraventricular tachycardia: Secondary | ICD-10-CM | POA: Diagnosis not present

## 2015-10-09 DIAGNOSIS — Z7982 Long term (current) use of aspirin: Secondary | ICD-10-CM | POA: Insufficient documentation

## 2015-10-09 DIAGNOSIS — I10 Essential (primary) hypertension: Secondary | ICD-10-CM | POA: Diagnosis not present

## 2015-10-09 DIAGNOSIS — K529 Noninfective gastroenteritis and colitis, unspecified: Secondary | ICD-10-CM | POA: Insufficient documentation

## 2015-10-09 DIAGNOSIS — R1011 Right upper quadrant pain: Secondary | ICD-10-CM

## 2015-10-09 DIAGNOSIS — R197 Diarrhea, unspecified: Secondary | ICD-10-CM

## 2015-10-09 DIAGNOSIS — Z8673 Personal history of transient ischemic attack (TIA), and cerebral infarction without residual deficits: Secondary | ICD-10-CM | POA: Diagnosis not present

## 2015-10-09 DIAGNOSIS — Z973 Presence of spectacles and contact lenses: Secondary | ICD-10-CM | POA: Insufficient documentation

## 2015-10-09 DIAGNOSIS — E785 Hyperlipidemia, unspecified: Secondary | ICD-10-CM | POA: Insufficient documentation

## 2015-10-09 DIAGNOSIS — Z7902 Long term (current) use of antithrombotics/antiplatelets: Secondary | ICD-10-CM | POA: Diagnosis not present

## 2015-10-09 DIAGNOSIS — Z79899 Other long term (current) drug therapy: Secondary | ICD-10-CM | POA: Insufficient documentation

## 2015-10-09 DIAGNOSIS — Z87891 Personal history of nicotine dependence: Secondary | ICD-10-CM | POA: Diagnosis not present

## 2015-10-09 DIAGNOSIS — Z87828 Personal history of other (healed) physical injury and trauma: Secondary | ICD-10-CM | POA: Diagnosis not present

## 2015-10-09 DIAGNOSIS — K644 Residual hemorrhoidal skin tags: Secondary | ICD-10-CM | POA: Insufficient documentation

## 2015-10-09 DIAGNOSIS — N400 Enlarged prostate without lower urinary tract symptoms: Secondary | ICD-10-CM | POA: Insufficient documentation

## 2015-10-09 LAB — CBC
HCT: 42.3 % (ref 39.0–52.0)
Hemoglobin: 14.9 g/dL (ref 13.0–17.0)
MCH: 34.3 pg — AB (ref 26.0–34.0)
MCHC: 35.2 g/dL (ref 30.0–36.0)
MCV: 97.5 fL (ref 78.0–100.0)
PLATELETS: 350 10*3/uL (ref 150–400)
RBC: 4.34 MIL/uL (ref 4.22–5.81)
RDW: 12.3 % (ref 11.5–15.5)
WBC: 17.5 10*3/uL — ABNORMAL HIGH (ref 4.0–10.5)

## 2015-10-09 LAB — COMPREHENSIVE METABOLIC PANEL
ALBUMIN: 4.5 g/dL (ref 3.5–5.0)
ALK PHOS: 69 U/L (ref 38–126)
ALT: 24 U/L (ref 17–63)
AST: 23 U/L (ref 15–41)
Anion gap: 7 (ref 5–15)
BILIRUBIN TOTAL: 1 mg/dL (ref 0.3–1.2)
BUN: 17 mg/dL (ref 6–20)
CALCIUM: 9.8 mg/dL (ref 8.9–10.3)
CO2: 27 mmol/L (ref 22–32)
CREATININE: 1.24 mg/dL (ref 0.61–1.24)
Chloride: 102 mmol/L (ref 101–111)
GFR calc Af Amer: >60 mL/min (ref >60)
GFR calc non Af Amer: 60 mL/min — ABNORMAL LOW (ref >60)
GLUCOSE: 164 mg/dL — AB (ref 65–99)
Potassium: 3.9 mmol/L (ref 3.5–5.1)
Sodium: 136 mmol/L (ref 135–145)
TOTAL PROTEIN: 8 g/dL (ref 6.5–8.1)

## 2015-10-09 LAB — URINALYSIS, ROUTINE W REFLEX MICROSCOPIC
Bilirubin Urine: NEGATIVE
Glucose, UA: >1000 mg/dL — AB
HGB URINE DIPSTICK: NEGATIVE
KETONES UR: NEGATIVE mg/dL
Leukocytes, UA: NEGATIVE
Nitrite: NEGATIVE
PROTEIN: NEGATIVE mg/dL
Specific Gravity, Urine: 1.027 (ref 1.005–1.030)
UROBILINOGEN UA: 0.2 mg/dL (ref 0.0–1.0)
pH: 5.5 (ref 5.0–8.0)

## 2015-10-09 LAB — PROTIME-INR
INR: 1.15 (ref 0.00–1.49)
PROTHROMBIN TIME: 14.9 s (ref 11.6–15.2)

## 2015-10-09 LAB — LIPASE, BLOOD: Lipase: 20 U/L (ref 11–51)

## 2015-10-09 LAB — URINE MICROSCOPIC-ADD ON

## 2015-10-09 LAB — TYPE AND SCREEN
ABO/RH(D): B POS
ANTIBODY SCREEN: NEGATIVE

## 2015-10-09 LAB — POC OCCULT BLOOD, ED: Fecal Occult Bld: POSITIVE — AB

## 2015-10-09 MED ORDER — IOHEXOL 300 MG/ML  SOLN
25.0000 mL | Freq: Once | INTRAMUSCULAR | Status: AC | PRN
  Administered 2015-10-09: 25 mL via ORAL

## 2015-10-09 MED ORDER — IOHEXOL 300 MG/ML  SOLN
100.0000 mL | Freq: Once | INTRAMUSCULAR | Status: AC | PRN
  Administered 2015-10-09: 100 mL via INTRAVENOUS

## 2015-10-09 MED ORDER — DICYCLOMINE HCL 20 MG PO TABS: 20.0000 mg | ORAL_TABLET | Freq: Two times a day (BID) | ORAL | Status: DC

## 2015-10-09 NOTE — ED Notes (Signed)
Blood draw attempted and was unsuccessful. RN notified

## 2015-10-09 NOTE — ED Notes (Signed)
Patient c/o mid abdominal pain, frequent diarrhea. Patient states he had bright red bleeding yesterday and today he is having dark blood with clots.

## 2015-10-09 NOTE — Discharge Instructions (Signed)

## 2015-10-09 NOTE — ED Provider Notes (Signed)
CSN: [id number]     Arrival date & time 10/09/15  1003 History   First MD Initiated Contact with Patient 10/09/15 1120     Chief Complaint  Patient presents with  . Abdominal Pain  . Diarrhea  . Rectal Bleeding     (Consider location/radiation/quality/duration/timing/severity/associated sxs/prior Treatment) Patient is a 64 y.o. male presenting with abdominal pain, diarrhea, and hematochezia.  Abdominal Pain Pain location:  Periumbilical Pain quality comment:  Like poking Pain radiates to:  Does not radiate Onset quality:  Gradual Duration:  2 days Timing:  Constant Chronicity:  New Worsened by:  Nothing tried Ineffective treatments: pepto bismol. Associated symptoms: diarrhea (every hour, dark black, tar) and hematochezia   Associated symptoms: no chest pain, no cough, no dysuria, no fever, no nausea, no shortness of breath, no sore throat and no vomiting   Associated symptoms comment:  Dark stool after pepto bismol  Risk factors: aspirin   Risk factors: has not had multiple surgeries (not on abdomen) and no NSAID use   Risk factors comment:  Hemorrhoids, never had colonoscopy Diarrhea Associated symptoms: abdominal pain   Associated symptoms: no fever, no headaches and no vomiting   Rectal Bleeding Associated symptoms: abdominal pain   Associated symptoms: no fever, no light-headedness and no vomiting     Past Medical History  Diagnosis Date  . Hypertension   . Hyperlipidemia   . Type 2 diabetes mellitus (HCC)   . History of myocardial infarction     1993--  NON-Q WAVE  S/P PCI TO LAD  . History of CVA (cerebrovascular accident)     1993---  NO RESIDUALS  . Hydrocele, left   . BPH (benign prostatic hypertrophy)     HX RETENTION  . PSVT (paroxysmal supraventricular tachycardia) (HCC)   . Coronary artery disease CARDIOLOGIST--  DR Johney Frame    S/P  PCI TO LAD 1993  . Carotid artery occlusion     S/P RIGHT CEA  . PVD (peripheral vascular disease) (HCC)     S/P LEFT  FEM-POP  . Left carotid artery stenosis     MILD  . History of head injury     CONCUSSION--  NO RESIDUAL  . Right leg weakness     USES CANE--  SECONDARY TO PVD  . At high risk for falls   . Wears glasses   . Myocardial infarction (HCC)   . GERD (gastroesophageal reflux disease)    Past Surgical History  Procedure Laterality Date  . Carotid endarterectomy Right 05-31-2003  . Femoral-popliteal bypass graft Left 01-19-2003  . Right hydrocelectomy  05-31-2003  . Coronary angioplasty  1993    PCI TO LAD  . Cardiac catheterization  01-18-2003   DR NISHAN    NON-OBSTRUCTIVE CAD/  PREVIOIS ANGIOPLASTY SITE WIDELY PATENT  . Cardiovascular stress test  2007    SMALL ANTERO-APICAL SCAR/  NO ISCHEMIA  . Transthoracic echocardiogram  08-24-2010    EF 55%/  MILD AORTIC INSUFFICENCY/  MODERATE LVH  . Hydrocele excision Left 10/27/2013    Procedure: HYDROCELECTOMY ADULT;  Surgeon: Lindaann Slough, MD;  Location: [facility];  Service: Urology;  Laterality: Left;   Family History  Problem Relation Age of Onset  . Stroke Mother   . Hypertension Mother   . Hypertension Father    Social History  Substance Use Topics  . Smoking status: Former Smoker -- 0 years    Types: Cigarettes    Quit date: 10/26/1993  . Smokeless tobacco: Never Used  .  Alcohol Use: No    Review of Systems  Constitutional: Negative for fever and appetite change.  HENT: Negative for sore throat.   Eyes: Negative for visual disturbance.  Respiratory: Negative for cough and shortness of breath.   Cardiovascular: Negative for chest pain.  Gastrointestinal: Positive for abdominal pain, diarrhea (every hour, dark black, tar), blood in stool (dark black stool), hematochezia and anal bleeding (bright red blood on toilet paper). Negative for nausea and vomiting.  Genitourinary: Negative for dysuria and difficulty urinating.  Musculoskeletal: Negative for back pain and neck stiffness.  Skin: Negative for  rash.  Neurological: Negative for syncope, light-headedness and headaches.      Allergies  Review of patient's allergies indicates no known allergies.  Home Medications   Prior to Admission medications   Medication Sig Start Date End Date Taking? Authorizing Provider  aspirin 81 MG EC tablet Take 1 tablet (81 mg total) by mouth daily. Take Aspirin and plavix for 3 months, then plavix alone indefintely 05/14/14  Yes Daniel J Angiulli, PA-C  clopidogrel (PLAVIX) 75 MG tablet Take 1 tablet (75 mg total) by mouth daily with breakfast. 05/14/14  Yes Mcarthur Rossetti Angiulli, PA-C  digoxin (LANOXIN) 0.25 MG tablet Take 0.25 mg by mouth daily.   Yes Historical Provider, MD  diltiazem (TAZTIA XT) 360 MG 24 hr capsule Take 1 capsule (360 mg total) by mouth daily. 05/14/14  Yes Daniel J Angiulli, PA-C  docusate sodium (COLACE) 50 MG capsule Take 50 mg by mouth at bedtime.    Yes Historical Provider, MD  doxazosin (CARDURA) 2 MG tablet Take 1 tablet (2 mg total) by mouth daily. 05/14/14  Yes Daniel J Angiulli, PA-C  glipiZIDE (GLUCOTROL XL) 5 MG 24 hr tablet Take 1 tablet (5 mg total) by mouth daily. 05/14/14  Yes Daniel J Angiulli, PA-C  metoprolol (LOPRESSOR) 50 MG tablet Take 1 tablet (50 mg total) by mouth 2 (two) times daily. 05/14/14  Yes Daniel J Angiulli, PA-C  rosuvastatin (CRESTOR) 10 MG tablet Take 1 tablet (10 mg total) by mouth daily. 05/14/14  Yes Daniel J Angiulli, PA-C  dicyclomine (BENTYL) 20 MG tablet Take 1 tablet (20 mg total) by mouth 2 (two) times daily. 10/09/15   Alvira Monday, MD   BP 128/50 mmHg  Pulse 60  Temp(Src) 98.8 F (37.1 C) (Oral)  Resp 21  Ht 6' (1.829 m)  Wt 240 lb (108.863 kg)  BMI 32.54 kg/m2  SpO2 94% Physical Exam  Constitutional: He is oriented to person, place, and time. He appears well-developed and well-nourished. No distress.  HENT:  Head: Normocephalic and atraumatic.  Eyes: Conjunctivae and EOM are normal.  Neck: Normal range of motion.  Cardiovascular: Normal  rate, regular rhythm, normal heart sounds and intact distal pulses.  Exam reveals no gallop and no friction rub.   No murmur heard. Pulmonary/Chest: Effort normal and breath sounds normal. No respiratory distress. He has no wheezes. He has no rales.  Abdominal: Soft. He exhibits no distension. There is tenderness (right lateral abdomen). There is no rebound, no guarding, no CVA tenderness, no tenderness at McBurney's point and negative Murphy's sign.  Genitourinary: Rectal exam shows external hemorrhoid. Rectal exam shows anal tone normal. Guaiac positive stool. Prostate is enlarged.  Musculoskeletal: He exhibits no edema.  Neurological: He is alert and oriented to person, place, and time.  Skin: Skin is warm and dry. He is not diaphoretic.  Nursing note and vitals reviewed.   ED Course  Procedures (including critical care time)  Labs Review Labs Reviewed  COMPREHENSIVE METABOLIC PANEL - Abnormal; Notable for the following:    Glucose, Bld 164 (*)    GFR calc non Af Amer 60 (*)    All other components within normal limits  CBC - Abnormal; Notable for the following:    WBC 17.5 (*)    MCH 34.3 (*)    All other components within normal limits  URINALYSIS, ROUTINE W REFLEX MICROSCOPIC (NOT AT [facility]) - Abnormal; Notable for the following:    Color, Urine AMBER (*)    Glucose, UA >1000 (*)    All other components within normal limits  POC OCCULT BLOOD, ED - Abnormal; Notable for the following:    Fecal Occult Bld POSITIVE (*)    All other components within normal limits  LIPASE, BLOOD  PROTIME-INR  URINE MICROSCOPIC-ADD ON  TYPE AND SCREEN  ABO/RH    Imaging Review Ct Abdomen Pelvis W Contrast  10/09/2015  CLINICAL DATA:  Pain behind umbilicus.  Low EXAM: CT ABDOMEN AND PELVIS WITH CONTRAST TECHNIQUE: Multidetector CT imaging of the abdomen and pelvis was performed using the standard protocol following bolus administration of intravenous contrast. CONTRAST:  100mL OMNIPAQUE IOHEXOL  300 MG/ML SOLN, 25mL OMNIPAQUE IOHEXOL 300 MG/ML SOLN COMPARISON:  5/ 18/15 FINDINGS: Lower chest: There is no pleural effusion identified. The lung bases are clear. Hepatobiliary: No suspicious liver abnormality. The gallbladder is normal. No biliary dilatation. Pancreas: Normal appearance of the pancreas. Spleen: The spleen is unremarkable. Adrenals/Urinary Tract: The adrenal glands are within normal limits. Normal appearance of the right kidney. The left kidney is normal. Urinary bladder is on unremarkable. Stomach/Bowel: The stomach is normal. The small bowel loops have a normal course and caliber. No obstruction. The appendix is visualized and appears normal. There is a segment of abnormal wall thickening and inflammation involving the ascending colon, image 38/series to this measures approximately 7 cm. There is an intervening segment of normal appearing colon followed by another short segment of abnormal wall thickening and inflammation which measures 7.6 cm in length and involves the transverse colon, image 40/series 2. The distal colon is unremarkable. There is no pneumatosis identified. No perforation. No bowel obstruction. Vascular/Lymphatic: Calcified atherosclerotic disease involves the abdominal aorta. No aneurysm. No enlarged retroperitoneal or mesenteric adenopathy. No enlarged pelvic or inguinal lymph nodes. Reproductive: Prostate gland enlargement noted. Symmetric appearance of the seminal vesicles. Other: No free fluid or fluid collections. There is a periapical umbilical hernia which contains fat only. Musculoskeletal: No aggressive lytic or sclerotic bone lesions identified. There is degenerative disc disease at L5-S1. IMPRESSION: 1. There are 2 segments of abnormal bowel wall thickening and inflammation involving the ascending colon and transverse colon compatible with segmental colitis. Findings may be inflammatory or infectious in etiology. Crohn's disease not excluded. 2. No complicating  features identified. Specifically there is no pneumatosis or evidence of bowel perforation. 3. Aortic atherosclerosis identified. Electronically Signed   By: Signa Kellaylor  Stroud M.D.   On: 10/09/2015 14:24   I have personally reviewed and evaluated these images and lab results as part of my medical decision-making.   EKG Interpretation None      MDM   Final diagnoses:  Right upper quadrant pain  Colitis  Diarrhea, unspecified type  Hemorrhoids, unspecified hemorrhoid type   64yo male with history of htn, hllpd, CAD, pSVT, CVA presents with concern for abdominal pain and diarrhea. Patient well appearing, without symptoms of lightheadedness, and diastolic blood pressures from machine likely inaccurate, reading in 30s.  CT abdomen shows signs of colitis.  Patient describes black stool beginning after taking pepto bismol, which may be etiology of black stool, in addition patient describes bright blood on tissue paper after BM, and has signs of hemorrhoids on exam which may be etiology of positive hemoccult.  Patient describes significant numbers of black stool (1 stool every hour for 2 days), however have low suspicion this represents clinically significant GI bleed with hemoglobin stable at 14.9.  Discussed options of hospital observation versus discharge, and patient prefers discharge with close PCP follow up, and given duration of symptoms with normal hgb feel this is appropriate. Pt with colitis, likely viral vs bacterail cannot rule out inflammatory. No hx to suggest cdiff risk factors.   Recommend follow up as outpatient as soon as possible with PCP, close GI follow up, and return to ED if symptoms worsen, he develops new symptoms or if he develops lightheadedness. Patient discharged in stable condition with understanding of reasons to return.    Alvira Monday, MD 10/09/15 2146

## 2015-10-10 LAB — ABO/RH: ABO/RH(D): B POS

## 2016-01-26 IMAGING — MR MR MRA HEAD W/O CM
10 of 13 series · 31 of 48 positions shown · non-contrast
Comparison: CT head from the same day.

CLINICAL DATA: Loss of consciousness. Stroke. Chronic left lower
extremity weakness. Left upper extremity weakness and and

EXAM:
MRI HEAD WITHOUT CONTRAST
MRA HEAD WITHOUT CONTRAST
TECHNIQUE: Multiplanar, multiecho pulse sequences of the brain and surrounding
structures were obtained without intravenous contrast. Angiographic
images of the head were obtained using MRA technique without
contrast.

[Series 4: T1 · sagittal · 5.0mm · 0.47mm/px · 1 of 24 slices shown (1 of 2)]
[im 1/24]
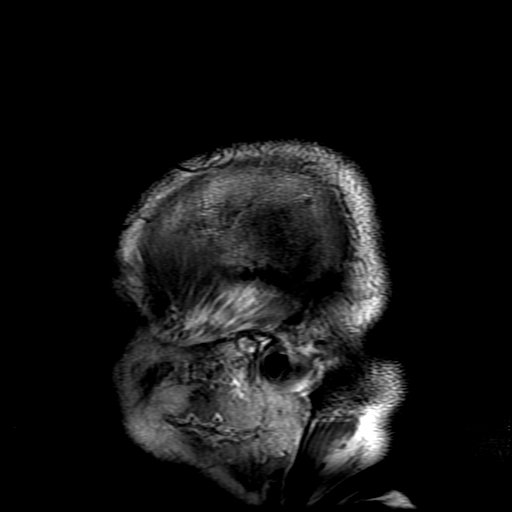

[Series 6: DWI · axial · 5.0mm · 1.09mm/px · z∈[-59,+86]mm · 6 of 60 slices shown (1 of 4)]
[im 1/60]
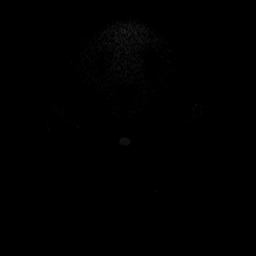
[im 12/60]
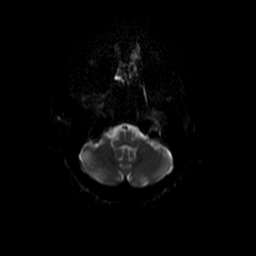
[im 24/60]
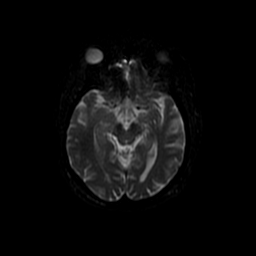
[im 36/60]
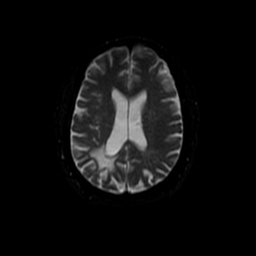
[im 48/60]
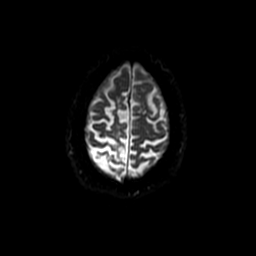
[im 60/60]
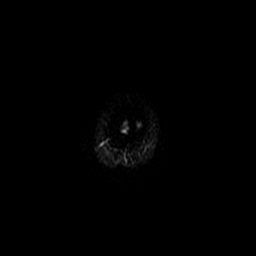

[Series 9: T2 · axial · 5.0mm · 0.43mm/px · z∈[-67,+77]mm · 2 of 25 slices shown (1 of 2)]
[im 1/25]
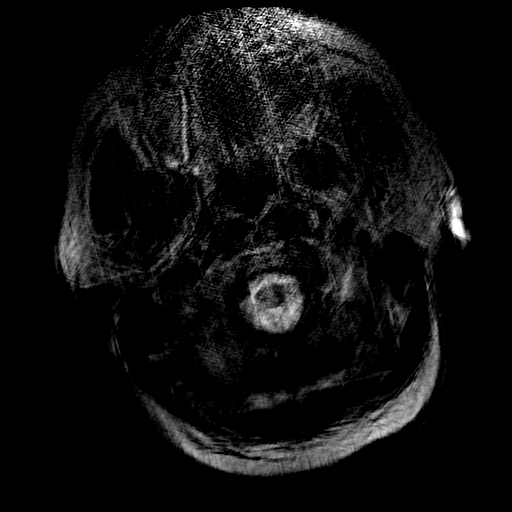
[im 25/25]
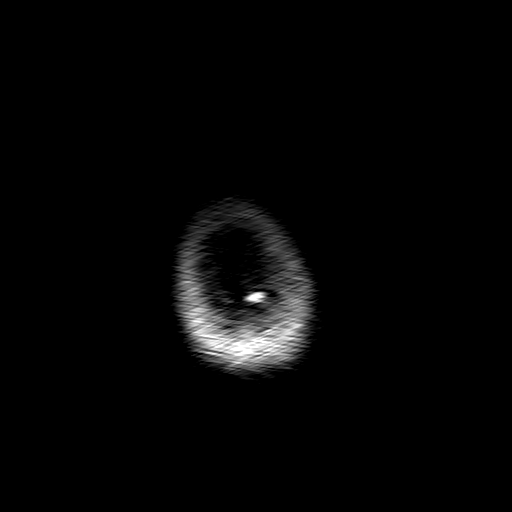

[Series 12: DWI · coronal · 5.0mm · 1.09mm/px · 6 of 66 slices shown (2 of 4)]
[im 1/66]
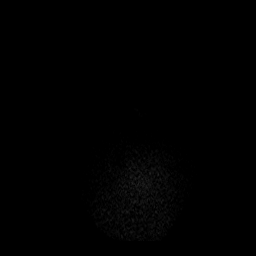
[im 14/66]
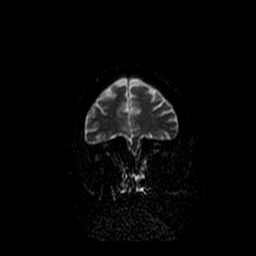
[im 27/66]
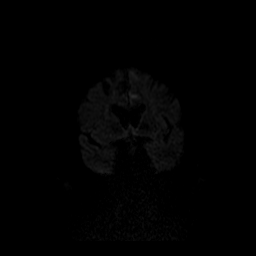
[im 40/66]
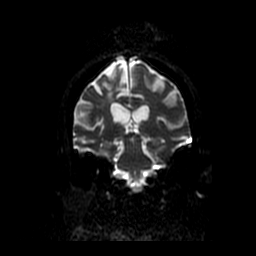
[im 53/66]
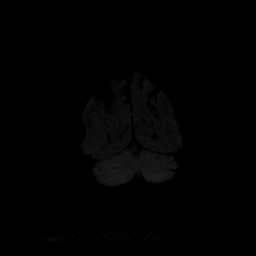
[im 66/66]
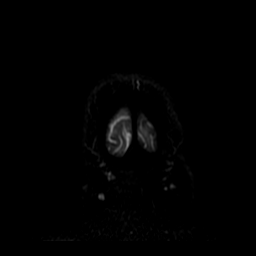

[Series 13: FLAIR · axial · 5.0mm · 0.43mm/px · z∈[-94,+62]mm · 3 of 27 slices shown]
[im 1/27]
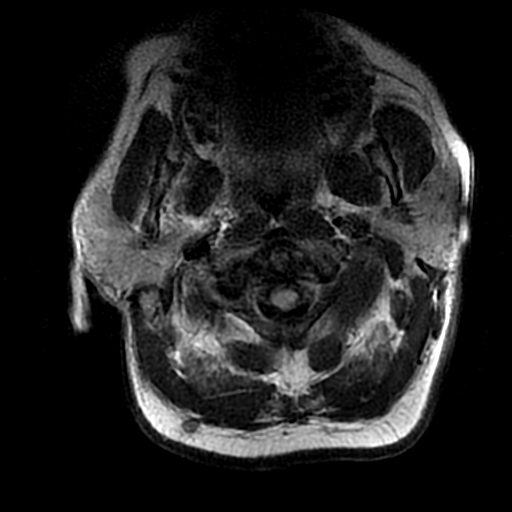
[im 14/27]
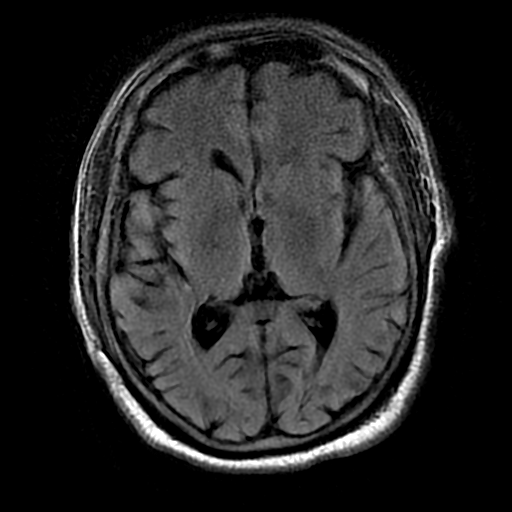
[im 27/27]
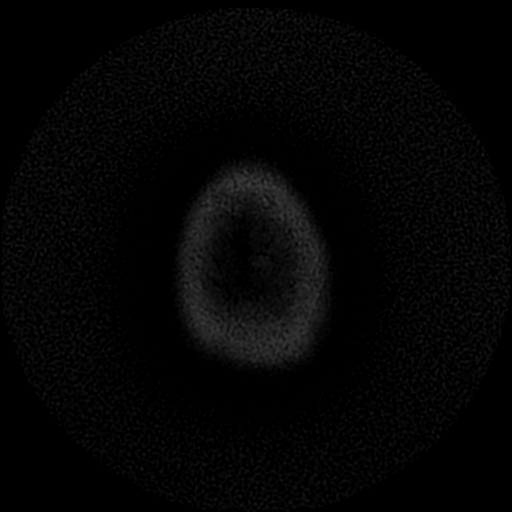

[Series 14: T1 · axial · 5.0mm · 0.47mm/px · z∈[-97,+63]mm · 2 of 24 slices shown (2 of 2)]
[im 1/24]
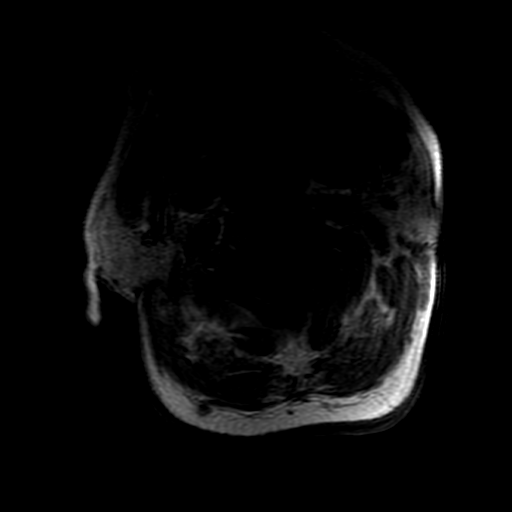
[im 24/24]
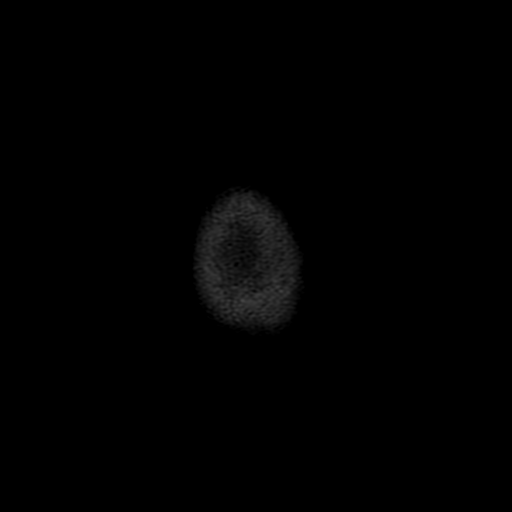

[Series 15: ax mpgr · axial · 5.0mm · 0.47mm/px · z∈[-97,+63]mm · 2 of 24 slices shown]
[im 1/24]
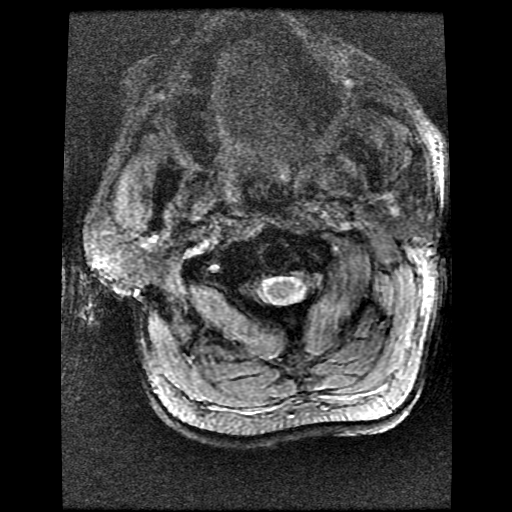
[im 24/24]
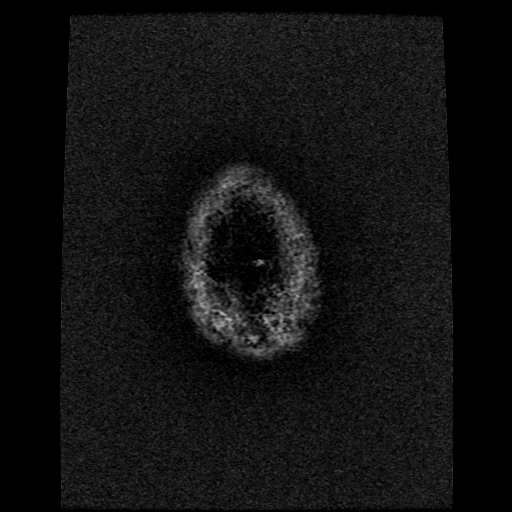

[Series 17: T2 · coronal · 5.0mm · 0.43mm/px · 3 of 31 slices shown (2 of 2)]
[im 1/31]
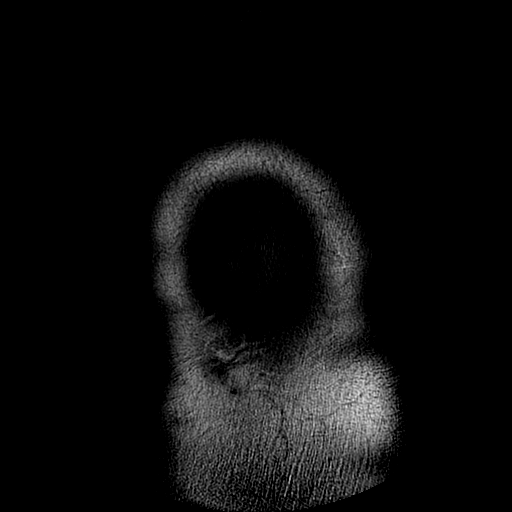
[im 16/31]
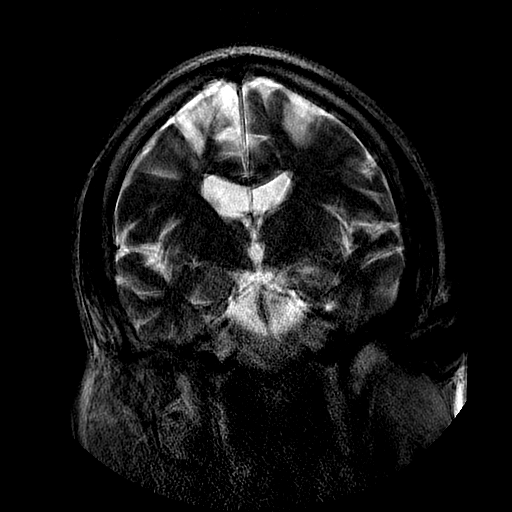
[im 31/31]
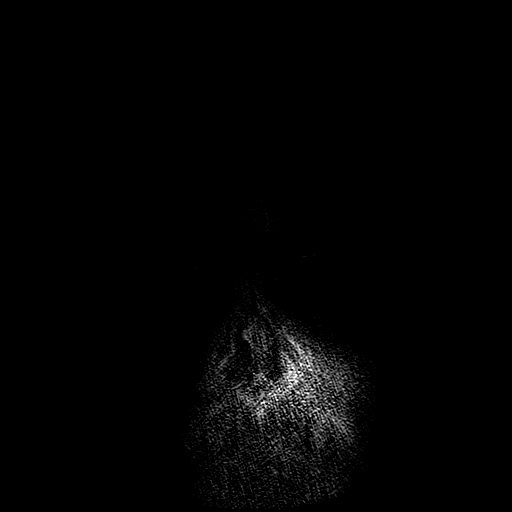

[Series 600: DWI · axial · 5.0mm · 1.09mm/px · z∈[-59,+86]mm · 3 of 30 slices shown (3 of 4)]
[im 1/30]
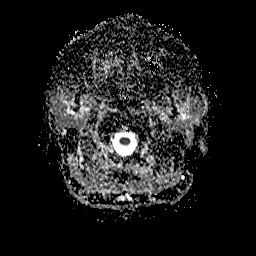
[im 15/30]
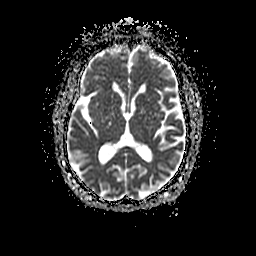
[im 30/30]
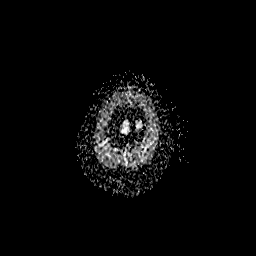

[Series 1200: DWI · coronal · 5.0mm · 1.09mm/px · 3 of 33 slices shown (4 of 4)]
[im 1/33]
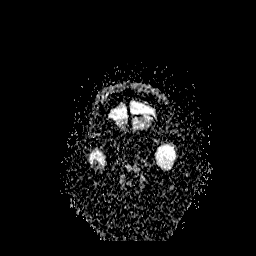
[im 17/33]
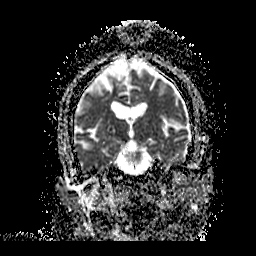
[im 33/33]
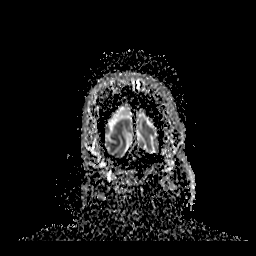

[31 of 48 positions shown; findings below may reference images not displayed]

FINDINGS: MRI HEAD FINDINGS

The diffusion-weighted images demonstrate an acute nonhemorrhagic
infarct within the left ACA territory. No hemorrhage or mass lesion
is present.

A remote right parietal lobe infarct is evident. Mild generalized
atrophy is noted. There is ex vacuo dilation of the right lateral
ventricle compatible with the prior infarct.

Right ICA and MCA conclusion is noted.

MRA HEAD FINDINGS

The MRA study is moderately degraded by patient motion. The right
internal carotid artery is occluded. The left internal carotid
artery is unremarkable. There is mild irregularity in the left A1
and M1 segments, likely exaggerated by patient motion. There is some
flow in the right A1 segment. Terminal right ICA is reconstituted at
the level of the right posterior communicating artery although no
dominant artery is seen.

The right vertebral artery is dominant to the left. The basilar
artery is within normal limits. There is moderate signal loss at the
origin of the left posterior cerebral artery. Both posterior
cerebral arteries originate from the basilar tip.
IMPRESSION: 1. Acute nonhemorrhagic left ACA territory infarct.
2. Remote right parietal lobe infarct.
3. Occlusion of the right internal carotid artery with
reconstitution at the level of the posterior communicating artery.
These results will be called to the ordering clinician or
representative by the Radiologist Assistant, and communication
documented in the PACS or zVision Dashboard.

## 2016-01-26 IMAGING — CR DG CHEST 2V
2 series · 2 of 2 positions shown · non-contrast
Comparison: No prior.

CLINICAL DATA: Stroke.

EXAM:
CHEST  2 VIEW

[w chest pa]
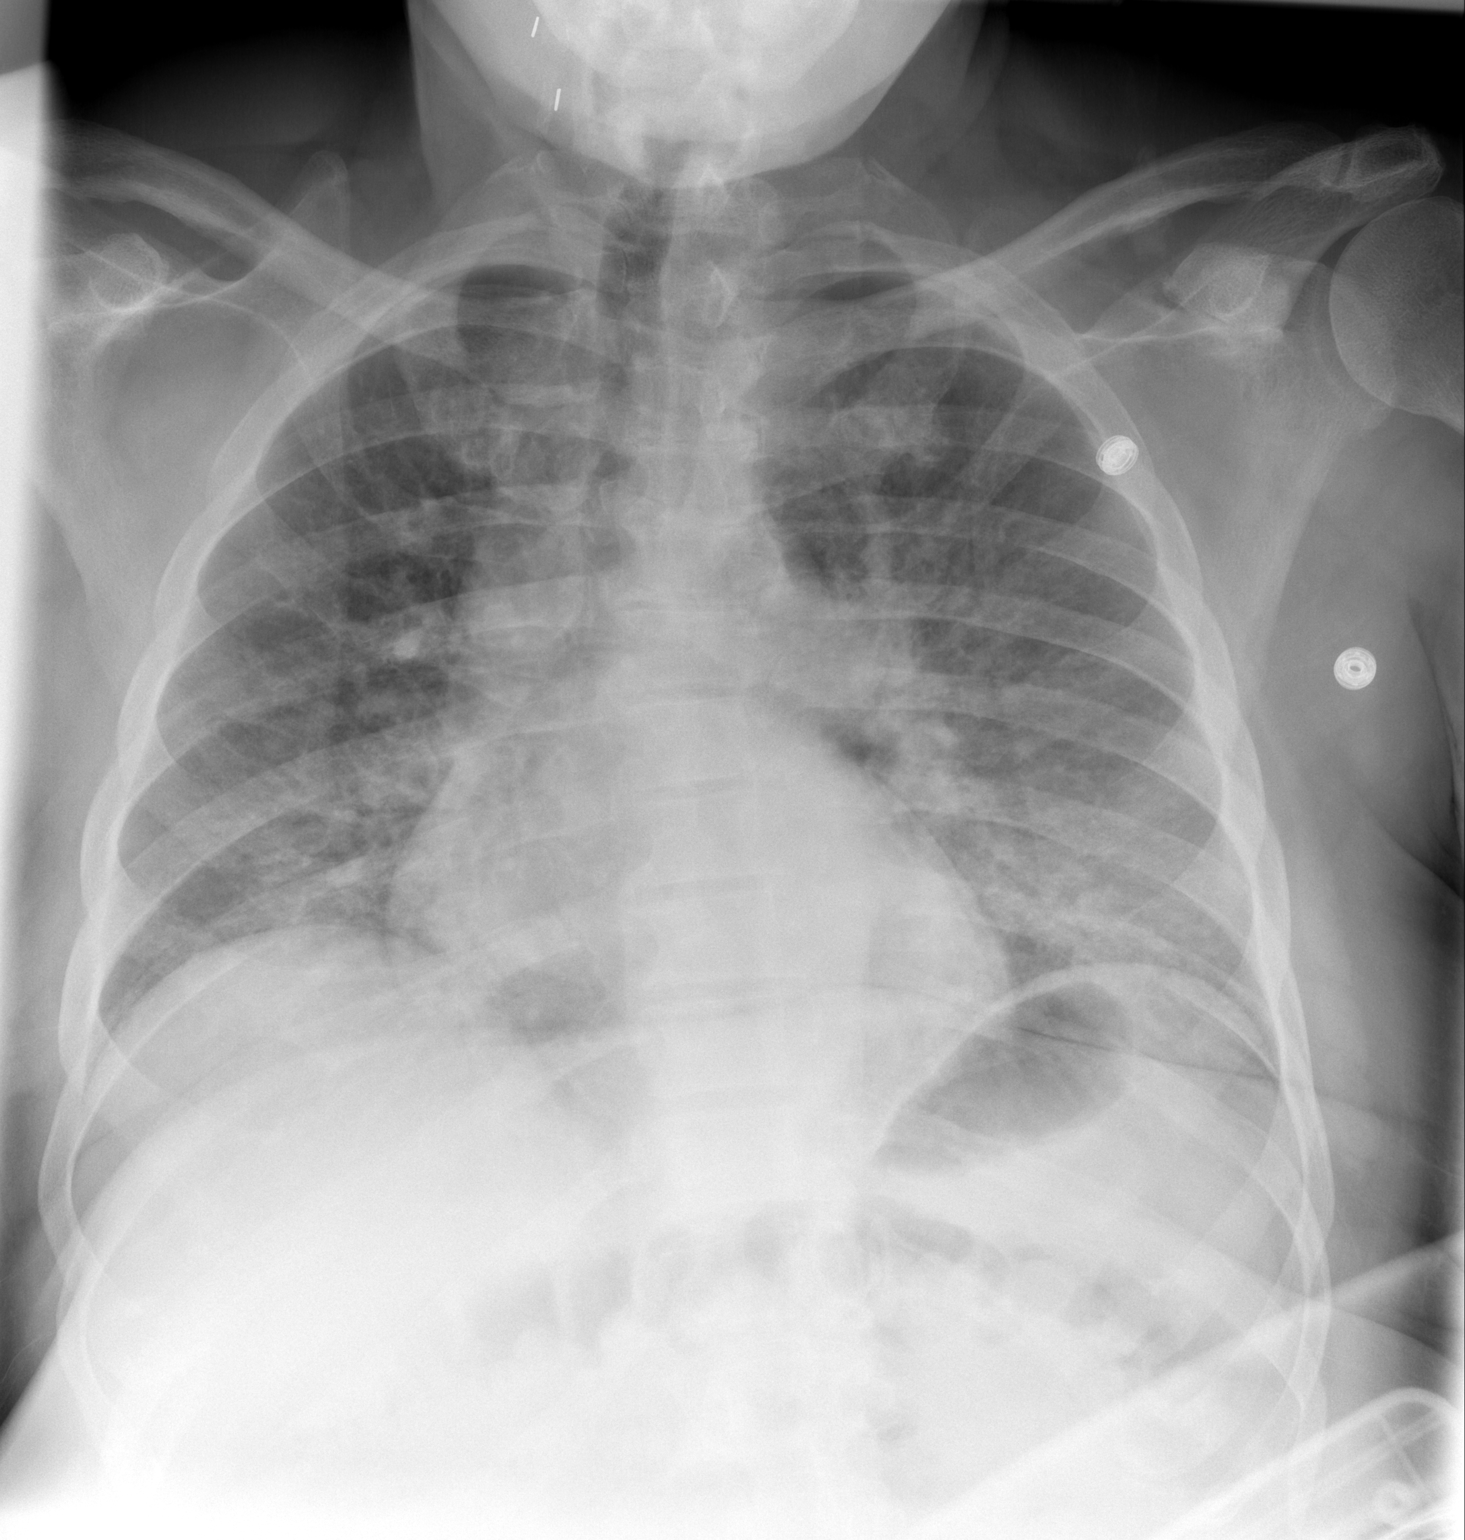

[w chest lat]
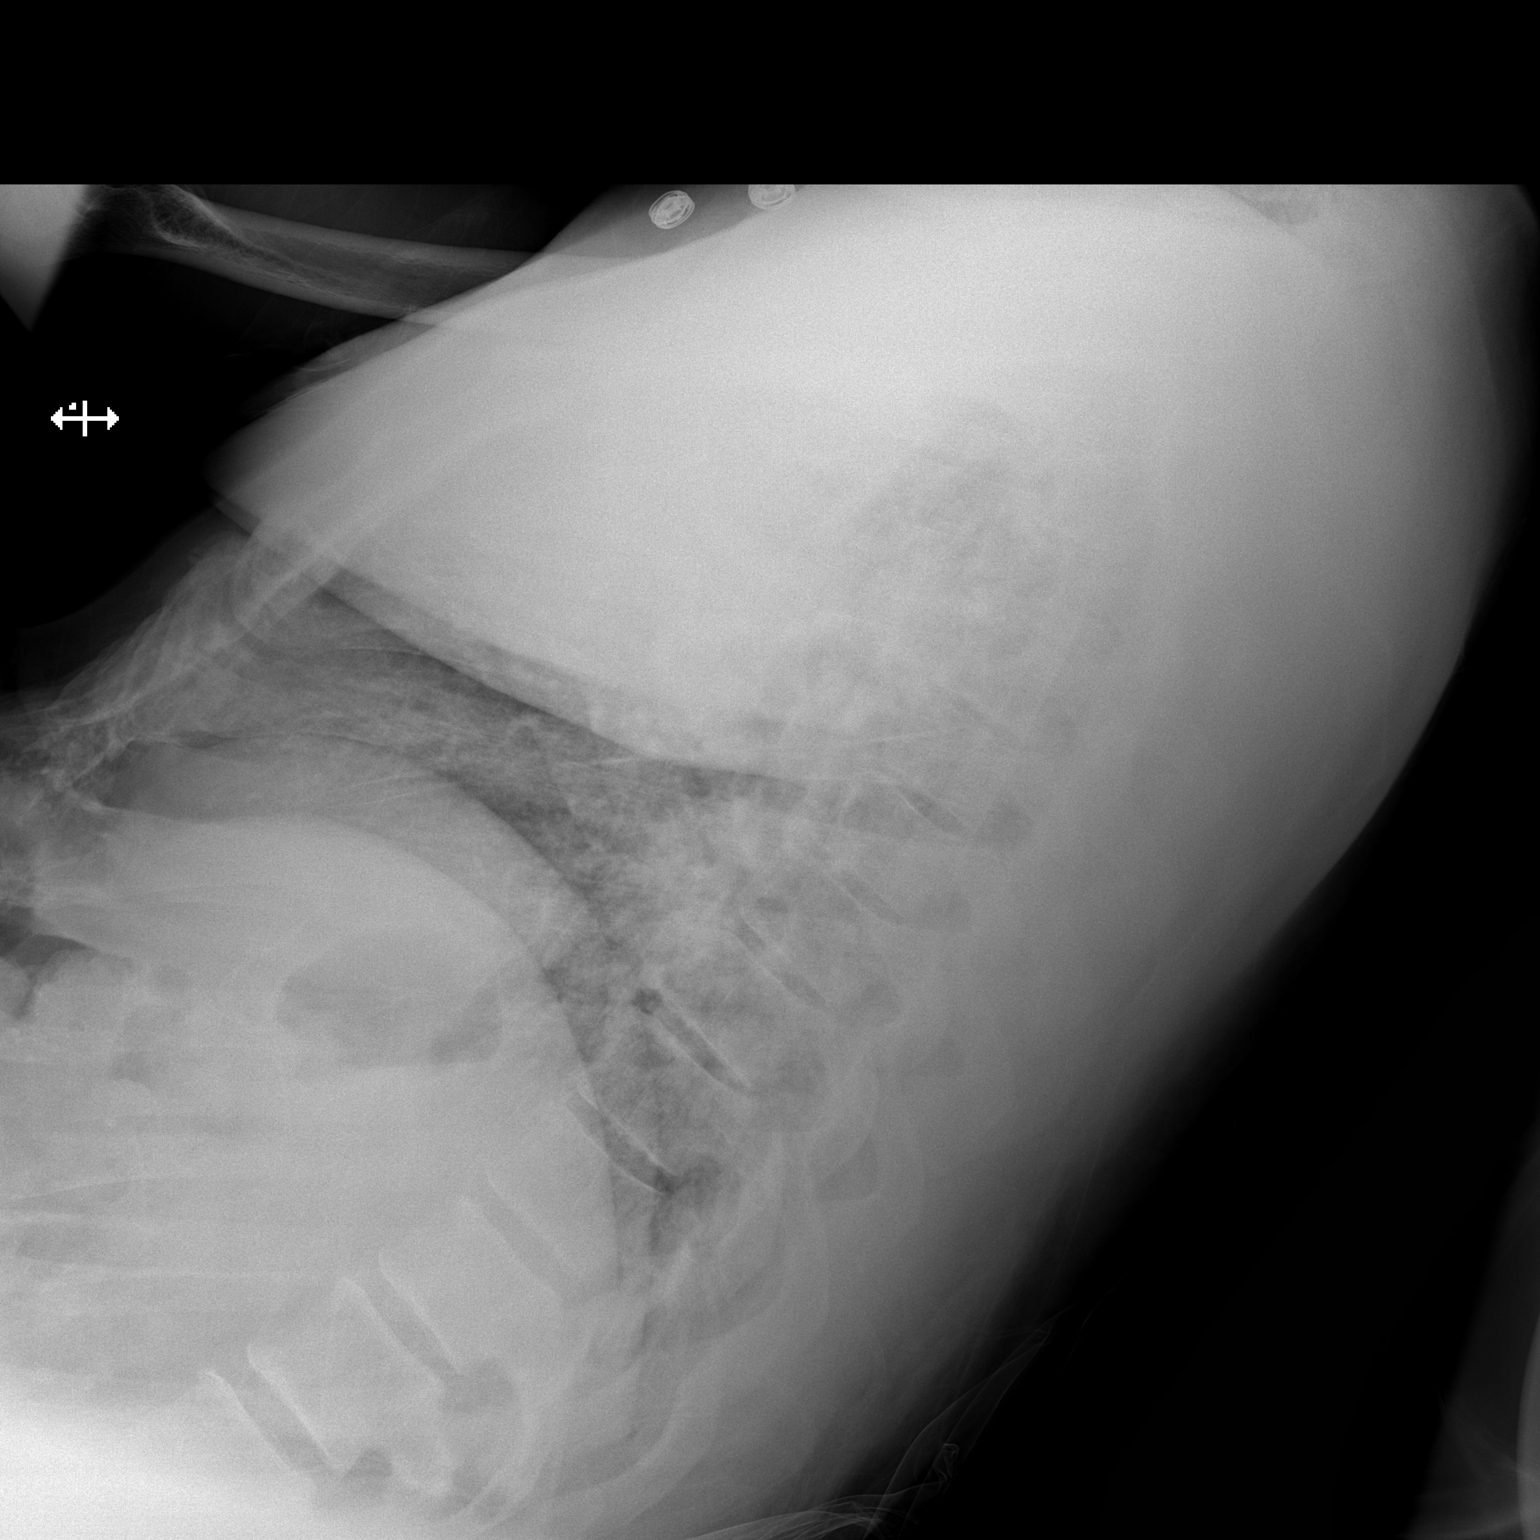

[2 of 2 positions shown; findings below may reference images not displayed]

FINDINGS: Bilateral pulmonary infiltrates are present. Differential diagnosis
includes pulmonary edema and/or pneumonia. Borderline cardiomegaly
with mild pulmonary vascular prominence. No pleural effusion or
pneumothorax.
IMPRESSION: Bilateral pulmonary infiltrates. Differential diagnosis includes
pulmonary edema and/or node bilateral pneumonia.

## 2017-03-10 ENCOUNTER — Emergency Department (HOSPITAL_COMMUNITY)
Admission: EM | Admit: 2017-03-10 | Discharge: 2017-03-11 | Discharge status: Against Medical Advice | Payer: Medicare Other | Source: Home / Self Care | Attending: Emergency Medicine | Admitting: Emergency Medicine

## 2017-03-10 ENCOUNTER — Emergency Department (HOSPITAL_COMMUNITY): Payer: Medicare Other

## 2017-03-10 DIAGNOSIS — R0989 Other specified symptoms and signs involving the circulatory and respiratory systems: Secondary | ICD-10-CM | POA: Insufficient documentation

## 2017-03-10 DIAGNOSIS — Z87891 Personal history of nicotine dependence: Secondary | ICD-10-CM

## 2017-03-10 DIAGNOSIS — M25561 Pain in right knee: Secondary | ICD-10-CM | POA: Insufficient documentation

## 2017-03-10 DIAGNOSIS — I252 Old myocardial infarction: Secondary | ICD-10-CM | POA: Insufficient documentation

## 2017-03-10 DIAGNOSIS — E119 Type 2 diabetes mellitus without complications: Secondary | ICD-10-CM

## 2017-03-10 DIAGNOSIS — Z7982 Long term (current) use of aspirin: Secondary | ICD-10-CM

## 2017-03-10 DIAGNOSIS — Y999 Unspecified external cause status: Secondary | ICD-10-CM

## 2017-03-10 DIAGNOSIS — Y929 Unspecified place or not applicable: Secondary | ICD-10-CM

## 2017-03-10 DIAGNOSIS — Z8673 Personal history of transient ischemic attack (TIA), and cerebral infarction without residual deficits: Secondary | ICD-10-CM

## 2017-03-10 DIAGNOSIS — I2510 Atherosclerotic heart disease of native coronary artery without angina pectoris: Secondary | ICD-10-CM

## 2017-03-10 DIAGNOSIS — Z7984 Long term (current) use of oral hypoglycemic drugs: Secondary | ICD-10-CM | POA: Insufficient documentation

## 2017-03-10 DIAGNOSIS — R531 Weakness: Secondary | ICD-10-CM | POA: Diagnosis not present

## 2017-03-10 DIAGNOSIS — Y939 Activity, unspecified: Secondary | ICD-10-CM

## 2017-03-10 DIAGNOSIS — W19XXXA Unspecified fall, initial encounter: Secondary | ICD-10-CM

## 2017-03-10 DIAGNOSIS — M48061 Spinal stenosis, lumbar region without neurogenic claudication: Secondary | ICD-10-CM | POA: Diagnosis not present

## 2017-03-10 DIAGNOSIS — Z79899 Other long term (current) drug therapy: Secondary | ICD-10-CM | POA: Insufficient documentation

## 2017-03-10 DIAGNOSIS — G459 Transient cerebral ischemic attack, unspecified: Secondary | ICD-10-CM

## 2017-03-10 LAB — RAPID URINE DRUG SCREEN, HOSP PERFORMED
AMPHETAMINES: NOT DETECTED
BARBITURATES: NOT DETECTED
Benzodiazepines: NOT DETECTED
Cocaine: NOT DETECTED
OPIATES: NOT DETECTED
TETRAHYDROCANNABINOL: NOT DETECTED

## 2017-03-10 LAB — CBC
HCT: 40.2 % (ref 39.0–52.0)
Hemoglobin: 13.9 g/dL (ref 13.0–17.0)
MCH: 33.8 pg (ref 26.0–34.0)
MCHC: 34.6 g/dL (ref 30.0–36.0)
MCV: 97.8 fL (ref 78.0–100.0)
PLATELETS: 363 10*3/uL (ref 150–400)
RBC: 4.11 MIL/uL — ABNORMAL LOW (ref 4.22–5.81)
RDW: 12.8 % (ref 11.5–15.5)
WBC: 9.7 10*3/uL (ref 4.0–10.5)

## 2017-03-10 LAB — URINALYSIS, ROUTINE W REFLEX MICROSCOPIC
Bilirubin Urine: NEGATIVE
Glucose, UA: NEGATIVE mg/dL
Hgb urine dipstick: NEGATIVE
KETONES UR: NEGATIVE mg/dL
LEUKOCYTES UA: NEGATIVE
NITRITE: NEGATIVE
PH: 5 (ref 5.0–8.0)
Protein, ur: NEGATIVE mg/dL
Specific Gravity, Urine: 1.012 (ref 1.005–1.030)

## 2017-03-10 LAB — PROTIME-INR
INR: 1.12
PROTHROMBIN TIME: 14.4 s (ref 11.4–15.2)

## 2017-03-10 LAB — DIFFERENTIAL
BASOS PCT: 0 %
Basophils Absolute: 0 10*3/uL (ref 0.0–0.1)
EOS ABS: 0.5 10*3/uL (ref 0.0–0.7)
EOS PCT: 5 %
LYMPHS ABS: 1.6 10*3/uL (ref 0.7–4.0)
Lymphocytes Relative: 16 %
MONO ABS: 1 10*3/uL (ref 0.1–1.0)
MONOS PCT: 11 %
NEUTROS PCT: 68 %
Neutro Abs: 6.6 10*3/uL (ref 1.7–7.7)

## 2017-03-10 LAB — APTT: aPTT: 30 seconds (ref 24–36)

## 2017-03-10 LAB — I-STAT TROPONIN, ED: Troponin i, poc: 0.03 ng/mL (ref 0.00–0.08)

## 2017-03-10 NOTE — ED Triage Notes (Signed)
Pt from home via [facility]. Family called EMS stating pt had fallen and was no longer speaking/nor acting @ baseline. Per EMS pt had some R sided deficits, which are resolved right now and was speaking PTA. Last seen normal was 1500 yesterday and around 2000 today pt was abnormal per family statement. Pt has hx of CVA w/ L sided deficits and shuffling gait. Pt c/o pain to R knee rated @ 10/10.

## 2017-03-10 NOTE — ED Notes (Signed)
Patient transported to CT 

## 2017-03-10 NOTE — ED Provider Notes (Signed)
[facility]-EMERGENCY DEPT Provider Note   CSN: [id number] Arrival date & time: 03/10/17  2105     History   Chief Complaint Chief Complaint  Patient presents with  . Fall  . Altered Mental Status    HPI Nicholas [name] is a 66 y.o. male.  Patient with h/o previous CVA on Plavix, diabetes mellitus, peripheral neuropathy, right carotid surgery 20 years ago and CAD with stenting. Patient lives alone independent with a cane. Patient was up performed chores today. Sometime, probably late this afternoon, patient developed weakness on his right side. His daughter had called him between 7 and 8 PM when he told her he was sitting on the floor. She arrived at his house around 8 PM and assisted him. She noted that he was weak on the right and was leaning towards the right side. EMS was called. Patient was not very verbally responsive upon EMS arrival, however patient was upset that EMS was there and may have just been non-cooperative. Symptoms have seemed to improve and resolve upon arrival to the emergency department. He currently complains of no right-sided weakness. He is speaking normally. Daughter also notes improvement. Patient did fall to his right knee and is complaining of right knee pain.  Last carotid doppler in system from 2015 -- low grade occlusions in the left and right carotid arteries.       Past Medical History:  Diagnosis Date  . At high risk for falls   . BPH (benign prostatic hypertrophy)    HX RETENTION  . Carotid artery occlusion    S/P RIGHT CEA  . Coronary artery disease CARDIOLOGIST--  DR Johney Frame   S/P  PCI TO LAD 1993  . GERD (gastroesophageal reflux disease)   . History of CVA (cerebrovascular accident)    1993---  NO RESIDUALS  . History of head injury    CONCUSSION--  NO RESIDUAL  . History of myocardial infarction    1993--  NON-Q WAVE  S/P PCI TO LAD  . Hydrocele, left   . Hyperlipidemia   . Hypertension   . Left carotid artery stenosis    MILD  .  Myocardial infarction   . PSVT (paroxysmal supraventricular tachycardia) (HCC)   . PVD (peripheral vascular disease) (HCC)    S/P LEFT FEM-POP  . Right leg weakness    USES CANE--  SECONDARY TO PVD  . Type 2 diabetes mellitus (HCC)   . Wears glasses     Patient Active Problem List   Diagnosis Date Noted  . Cognitive deficits, late effect of cerebrovascular disease 07/23/2014  . 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 CVA (cerebral infarction) 04/28/2014  . Stroke (HCC) 04/26/2014  . Acute encephalopathy 04/26/2014  . PSVT 09/15/2010  . AORTIC INSUFFICIENCY 08/09/2010  . CLAUDICATION 08/09/2010  . PALPITATIONS 08/09/2010  . HYPERLIPIDEMIA-MIXED 08/08/2010  . TOBACCO ABUSE 08/08/2010  . HYPERTENSION, UNSPECIFIED 08/08/2010  . CAD, NATIVE VESSEL 08/08/2010  . CAROTID ARTERY DISEASE 08/08/2010    Past Surgical History:  Procedure Laterality Date  . CARDIAC CATHETERIZATION  01-18-2003   DR Eden Emms   NON-OBSTRUCTIVE CAD/  PREVIOIS ANGIOPLASTY SITE WIDELY PATENT  . CARDIOVASCULAR STRESS TEST  2007   SMALL ANTERO-APICAL SCAR/  NO ISCHEMIA  . CAROTID ENDARTERECTOMY Right 05-31-2003  . CORONARY ANGIOPLASTY  1993   PCI TO LAD  . FEMORAL-POPLITEAL BYPASS GRAFT Left 01-19-2003  . HYDROCELE EXCISION Left 10/27/2013   Procedure: HYDROCELECTOMY ADULT;  Surgeon: Lindaann Slough, MD;  Location: [facility];  Service: Urology;  Laterality: Left;  . RIGHT HYDROCELECTOMY  05-31-2003  . TRANSTHORACIC ECHOCARDIOGRAM  08-24-2010   EF 55%/  MILD AORTIC INSUFFICENCY/  MODERATE LVH       Home Medications    Prior to Admission medications   Medication Sig Start Date End Date Taking? Authorizing Provider  aspirin 81 MG EC tablet Take 1 tablet (81 mg total) by mouth daily. Take Aspirin and plavix for 3 months, then plavix alone indefintely 05/14/14   Mcarthur Rossetti Angiulli, PA-C  clopidogrel (PLAVIX) 75 MG tablet Take 1 tablet (75 mg total) by mouth daily with  breakfast. 05/14/14   Mcarthur Rossetti Angiulli, PA-C  dicyclomine (BENTYL) 20 MG tablet Take 1 tablet (20 mg total) by mouth 2 (two) times daily. 10/09/15   Alvira Monday, MD  digoxin (LANOXIN) 0.25 MG tablet Take 0.25 mg by mouth daily.    Historical Provider, MD  diltiazem (TAZTIA XT) 360 MG 24 hr capsule Take 1 capsule (360 mg total) by mouth daily. 05/14/14   Mcarthur Rossetti Angiulli, PA-C  docusate sodium (COLACE) 50 MG capsule Take 50 mg by mouth at bedtime.     Historical Provider, MD  doxazosin (CARDURA) 2 MG tablet Take 1 tablet (2 mg total) by mouth daily. 05/14/14   Mcarthur Rossetti Angiulli, PA-C  glipiZIDE (GLUCOTROL XL) 5 MG 24 hr tablet Take 1 tablet (5 mg total) by mouth daily. 05/14/14   Mcarthur Rossetti Angiulli, PA-C  metoprolol (LOPRESSOR) 50 MG tablet Take 1 tablet (50 mg total) by mouth 2 (two) times daily. 05/14/14   Mcarthur Rossetti Angiulli, PA-C  rosuvastatin (CRESTOR) 10 MG tablet Take 1 tablet (10 mg total) by mouth daily. 05/14/14   Mcarthur Rossetti Angiulli, PA-C    Family History Family History  Problem Relation Age of Onset  . Stroke Mother   . Hypertension Mother   . Hypertension Father     Social History Social History  Substance Use Topics  . Smoking status: Former Smoker    Years: 0.00    Types: Cigarettes    Quit date: 10/26/1993  . Smokeless tobacco: Never Used  . Alcohol use No     Allergies   Patient has no known allergies.   Review of Systems Review of Systems  Constitutional: Negative for fever.  HENT: Negative for congestion, dental problem, rhinorrhea and sinus pressure.   Eyes: Negative for photophobia, discharge, redness and visual disturbance.  Respiratory: Negative for shortness of breath.   Cardiovascular: Negative for chest pain.  Gastrointestinal: Negative for nausea and vomiting.  Musculoskeletal: Positive for arthralgias and gait problem. Negative for neck pain and neck stiffness.  Skin: Negative for rash.  Neurological: Positive for weakness. Negative for syncope, speech  difficulty, light-headedness, numbness and headaches.  Psychiatric/Behavioral: Negative for confusion.     Physical Exam Updated Vital Signs Ht  (1.803 m)   Wt 117.9 kg   BMI 36.26 kg/m   Physical Exam  Constitutional: He is oriented to person, place, and time. He appears well-developed and well-nourished.  HENT:  Head: Normocephalic and atraumatic.  Right Ear: Tympanic membrane, external ear and ear canal normal.  Left Ear: Tympanic membrane, external ear and ear canal normal.  Nose: Nose normal.  Mouth/Throat: Uvula is midline, oropharynx is clear and moist and mucous membranes are normal.  Eyes: Conjunctivae, EOM and lids are normal. Pupils are equal, round, and reactive to light. Right eye exhibits no discharge. Left eye exhibits no discharge.  Neck: Trachea normal and normal range of motion.  Neck supple. Carotid bruit is present (Left-sided).    Cardiovascular: Normal rate and regular rhythm.   Murmur heard.  Systolic murmur is present  Pulmonary/Chest: Effort normal and breath sounds normal.  Abdominal: Soft. There is no tenderness.  Musculoskeletal: Normal range of motion.       Cervical back: He exhibits normal range of motion, no tenderness and no bony tenderness.  Neurological: He is alert and oriented to person, place, and time. He has normal strength and normal reflexes. No cranial nerve deficit or sensory deficit. He exhibits normal muscle tone. He displays a negative Romberg sign. Coordination normal. GCS eye subscore is 4. GCS verbal subscore is 5. GCS motor subscore is 6.  Skin: Skin is warm and dry.  Psychiatric: He has a normal mood and affect.  Nursing note and vitals reviewed.    ED Treatments / Results  Labs (all labs ordered are listed, but only abnormal results are displayed) Labs Reviewed  CBC - Abnormal; Notable for the following:       Result Value   RBC 4.11 (*)    All other components within normal limits  ETHANOL  PROTIME-INR  APTT    DIFFERENTIAL  RAPID URINE DRUG SCREEN, HOSP PERFORMED  URINALYSIS, ROUTINE W REFLEX MICROSCOPIC  COMPREHENSIVE METABOLIC PANEL  I-STAT TROPOININ, ED    EKG  EKG Interpretation  Date/Time:  Sunday March 10 2017 21:15:34 EDT Ventricular Rate:  67 PR Interval:    QRS Duration: 95 QT Interval:  371 QTC Calculation: 392 R Axis:   47 Text Interpretation:  Sinus rhythm Probable anteroseptal infarct, recent Nonspecific ST and T wave abnormality When compared with ECG of 10/03/2014 Nonspecific ST and T wave abnormality is now Present Confirmed by [facility]  MD, Nicholos Johns [phone]) on 03/10/2017 10:36:06 PM       Radiology Ct Head Wo Contrast  Result Date: 03/10/2017 CLINICAL DATA:  Right-sided weakness.  Gait difficulty. EXAM: CT HEAD WITHOUT CONTRAST TECHNIQUE: Contiguous axial images were obtained from the base of the skull through the vertex without intravenous contrast. COMPARISON:  10/03/2014 FINDINGS: Brain: Chronic encephalomalacia within the right posterior parietal lobe is identified, image 22 of series 201. Remote left ACA territory infarct is unchanged from previous exam. There is prominence of the sulci and ventricles identified compatible with brain atrophy. No abnormal extra-axial fluid collection, intracranial hemorrhage or mass identified. Vascular: No hyperdense vessel or unexpected calcification. Skull: Normal. Negative for fracture or focal lesion. Sinuses/Orbits: No acute finding. Other: None. IMPRESSION: 1. No acute intracranial abnormality. 2. 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 Electronically Signed   By: Signa Kell M.D.   On: 03/10/2017 22:41   Dg Knee Complete 4 Views Right  Result Date: 03/10/2017 CLINICAL DATA:  Fall. EXAM: RIGHT KNEE - COMPLETE 4+ VIEW COMPARISON:  None. FINDINGS: There is sharpening of the tibial spines. 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 No acute fractures or subluxations identified. IMPRESSION: 1.  Osteoarthritis. 2. No acute findings. Electronically Signed   By: Signa Kell M.D.   On: 03/10/2017 23:05    Procedures Procedures (including critical care time)    Initial Impression / Assessment and Plan / ED Course  I have reviewed the triage vital signs and the nursing notes.  Pertinent labs & imaging results that were available during my care of the patient were reviewed by me and considered in my medical decision making (see chart for details).     Patient seen and examined. Work-up initiated. Medications ordered.  Vital signs reviewed and are as follows: BP (!) 138/57   Pulse 72   Resp 14   Ht  (1.803 m)   Wt 117.9 kg   SpO2 96%   BMI 36.26 kg/m   12:04 AM Patient and daughter at bedside informed of all results to this point including head CT -- other than CMP which needed to be redrawn and is pending.   Patient is adamant about not staying in the hospital. We discussed his stroke risk which is very high. We also discussed the objective findings on exam (bruit). He states that he understands this. He states that he still has not paid the bill from his last hospital visit. He does not want to stay in the hospital. He does agree however, to following up with his primary care physician. His daughter at bedside will help ensure this occurs.  I discussed with the patient that if he changes his mind or decides that he wants further workup, he may return to the hospital any time. I instructed him to call 911 immediately if any recurrent symptoms become apparent. He also voices reluctance to call an ambulance due to the cost.   To wait CMP results. Will sign out AMA at that time.  Patient has been discussed with Dr. Clarene Duke.  Final Clinical Impressions(s) / ED Diagnoses   Final diagnoses:  Transient cerebral ischemia, unspecified type   Patient with signs and symptoms suspicious for TIA. Symptoms now completely resolved. I have recommended admission to the hospital  for further workup. Patient refuses. Discussion as above. He will sign out AGAINST MEDICAL ADVICE.  New Prescriptions New Prescriptions   No medications on file     Renne Crigler, PA-C 03/11/17 0050    Samuel Jester, DO 03/12/17 2014

## 2017-03-11 ENCOUNTER — Inpatient Hospital Stay (HOSPITAL_COMMUNITY): Payer: Medicare Other

## 2017-03-11 ENCOUNTER — Encounter (HOSPITAL_COMMUNITY): Payer: Self-pay

## 2017-03-11 ENCOUNTER — Inpatient Hospital Stay (HOSPITAL_COMMUNITY)
Admission: EM | Admit: 2017-03-11 | Discharge: 2017-03-13 | DRG: 552 | Disposition: A | Discharge status: Rehabilitation Facility | Payer: Medicare Other | Attending: Student in an Organized Health Care Education/Training Program | Admitting: Student in an Organized Health Care Education/Training Program

## 2017-03-11 DIAGNOSIS — Z823 Family history of stroke: Secondary | ICD-10-CM

## 2017-03-11 DIAGNOSIS — E1151 Type 2 diabetes mellitus with diabetic peripheral angiopathy without gangrene: Secondary | ICD-10-CM | POA: Diagnosis present

## 2017-03-11 DIAGNOSIS — Z7902 Long term (current) use of antithrombotics/antiplatelets: Secondary | ICD-10-CM | POA: Diagnosis not present

## 2017-03-11 DIAGNOSIS — I2510 Atherosclerotic heart disease of native coronary artery without angina pectoris: Secondary | ICD-10-CM | POA: Diagnosis present

## 2017-03-11 DIAGNOSIS — E785 Hyperlipidemia, unspecified: Secondary | ICD-10-CM | POA: Diagnosis present

## 2017-03-11 DIAGNOSIS — Z79899 Other long term (current) drug therapy: Secondary | ICD-10-CM | POA: Diagnosis not present

## 2017-03-11 DIAGNOSIS — F4320 Adjustment disorder, unspecified: Secondary | ICD-10-CM | POA: Diagnosis not present

## 2017-03-11 DIAGNOSIS — M5416 Radiculopathy, lumbar region: Secondary | ICD-10-CM | POA: Diagnosis present

## 2017-03-11 DIAGNOSIS — G459 Transient cerebral ischemic attack, unspecified: Secondary | ICD-10-CM | POA: Diagnosis present

## 2017-03-11 DIAGNOSIS — Z87891 Personal history of nicotine dependence: Secondary | ICD-10-CM | POA: Diagnosis not present

## 2017-03-11 DIAGNOSIS — F17200 Nicotine dependence, unspecified, uncomplicated: Secondary | ICD-10-CM | POA: Diagnosis present

## 2017-03-11 DIAGNOSIS — I69354 Hemiplegia and hemiparesis following cerebral infarction affecting left non-dominant side: Secondary | ICD-10-CM | POA: Diagnosis not present

## 2017-03-11 DIAGNOSIS — I6529 Occlusion and stenosis of unspecified carotid artery: Secondary | ICD-10-CM | POA: Diagnosis present

## 2017-03-11 DIAGNOSIS — M21372 Foot drop, left foot: Secondary | ICD-10-CM | POA: Diagnosis not present

## 2017-03-11 DIAGNOSIS — I639 Cerebral infarction, unspecified: Secondary | ICD-10-CM | POA: Diagnosis present

## 2017-03-11 DIAGNOSIS — I350 Nonrheumatic aortic (valve) stenosis: Secondary | ICD-10-CM | POA: Diagnosis present

## 2017-03-11 DIAGNOSIS — I252 Old myocardial infarction: Secondary | ICD-10-CM | POA: Diagnosis not present

## 2017-03-11 DIAGNOSIS — Z7984 Long term (current) use of oral hypoglycemic drugs: Secondary | ICD-10-CM | POA: Diagnosis not present

## 2017-03-11 DIAGNOSIS — Z66 Do not resuscitate: Secondary | ICD-10-CM | POA: Diagnosis present

## 2017-03-11 DIAGNOSIS — I471 Supraventricular tachycardia: Secondary | ICD-10-CM | POA: Diagnosis present

## 2017-03-11 DIAGNOSIS — Z8673 Personal history of transient ischemic attack (TIA), and cerebral infarction without residual deficits: Secondary | ICD-10-CM

## 2017-03-11 DIAGNOSIS — I359 Nonrheumatic aortic valve disorder, unspecified: Secondary | ICD-10-CM | POA: Diagnosis not present

## 2017-03-11 DIAGNOSIS — R531 Weakness: Secondary | ICD-10-CM | POA: Diagnosis present

## 2017-03-11 DIAGNOSIS — R011 Cardiac murmur, unspecified: Secondary | ICD-10-CM

## 2017-03-11 DIAGNOSIS — N401 Enlarged prostate with lower urinary tract symptoms: Secondary | ICD-10-CM

## 2017-03-11 DIAGNOSIS — R339 Retention of urine, unspecified: Secondary | ICD-10-CM | POA: Diagnosis not present

## 2017-03-11 DIAGNOSIS — Z8679 Personal history of other diseases of the circulatory system: Secondary | ICD-10-CM | POA: Diagnosis not present

## 2017-03-11 DIAGNOSIS — Z955 Presence of coronary angioplasty implant and graft: Secondary | ICD-10-CM

## 2017-03-11 DIAGNOSIS — A499 Bacterial infection, unspecified: Secondary | ICD-10-CM | POA: Diagnosis not present

## 2017-03-11 DIAGNOSIS — M48062 Spinal stenosis, lumbar region with neurogenic claudication: Secondary | ICD-10-CM | POA: Diagnosis not present

## 2017-03-11 DIAGNOSIS — M48061 Spinal stenosis, lumbar region without neurogenic claudication: Secondary | ICD-10-CM | POA: Diagnosis present

## 2017-03-11 DIAGNOSIS — E1140 Type 2 diabetes mellitus with diabetic neuropathy, unspecified: Secondary | ICD-10-CM | POA: Diagnosis present

## 2017-03-11 DIAGNOSIS — E119 Type 2 diabetes mellitus without complications: Secondary | ICD-10-CM | POA: Diagnosis not present

## 2017-03-11 DIAGNOSIS — N138 Other obstructive and reflux uropathy: Secondary | ICD-10-CM | POA: Diagnosis present

## 2017-03-11 DIAGNOSIS — M21371 Foot drop, right foot: Secondary | ICD-10-CM | POA: Diagnosis present

## 2017-03-11 DIAGNOSIS — R269 Unspecified abnormalities of gait and mobility: Secondary | ICD-10-CM | POA: Diagnosis not present

## 2017-03-11 DIAGNOSIS — I10 Essential (primary) hypertension: Secondary | ICD-10-CM | POA: Diagnosis present

## 2017-03-11 DIAGNOSIS — G458 Other transient cerebral ischemic attacks and related syndromes: Secondary | ICD-10-CM | POA: Diagnosis not present

## 2017-03-11 DIAGNOSIS — R29898 Other symptoms and signs involving the musculoskeletal system: Secondary | ICD-10-CM

## 2017-03-11 DIAGNOSIS — R338 Other retention of urine: Secondary | ICD-10-CM | POA: Diagnosis present

## 2017-03-11 DIAGNOSIS — N390 Urinary tract infection, site not specified: Secondary | ICD-10-CM | POA: Diagnosis not present

## 2017-03-11 DIAGNOSIS — K5901 Slow transit constipation: Secondary | ICD-10-CM | POA: Diagnosis not present

## 2017-03-11 LAB — COMPREHENSIVE METABOLIC PANEL
ALBUMIN: 3.8 g/dL (ref 3.5–5.0)
ALK PHOS: 70 U/L (ref 38–126)
ALT: 26 U/L (ref 17–63)
ANION GAP: 8 (ref 5–15)
AST: 26 U/L (ref 15–41)
BILIRUBIN TOTAL: 0.9 mg/dL (ref 0.3–1.2)
BUN: 11 mg/dL (ref 6–20)
CALCIUM: 9.4 mg/dL (ref 8.9–10.3)
CO2: 30 mmol/L (ref 22–32)
Chloride: 103 mmol/L (ref 101–111)
Creatinine, Ser: 1.17 mg/dL (ref 0.61–1.24)
GFR calc Af Amer: >60 mL/min (ref >60)
GFR calc non Af Amer: >60 mL/min (ref >60)
GLUCOSE: 99 mg/dL (ref 65–99)
Potassium: 3.7 mmol/L (ref 3.5–5.1)
SODIUM: 141 mmol/L (ref 135–145)
TOTAL PROTEIN: 6.6 g/dL (ref 6.5–8.1)

## 2017-03-11 LAB — CBG MONITORING, ED: Glucose-Capillary: 144 mg/dL — ABNORMAL HIGH (ref 65–99)

## 2017-03-11 LAB — GLUCOSE, CAPILLARY
GLUCOSE-CAPILLARY: 108 mg/dL — AB (ref 65–99)
Glucose-Capillary: 97 mg/dL (ref 65–99)

## 2017-03-11 LAB — ETHANOL

## 2017-03-11 MED ORDER — ACETAMINOPHEN 325 MG PO TABS: 650.0000 mg | ORAL_TABLET | Freq: Four times a day (QID) | ORAL | Status: DC | PRN

## 2017-03-11 MED ORDER — SODIUM CHLORIDE 0.9% FLUSH
3.0000 mL | Freq: Two times a day (BID) | INTRAVENOUS | Status: DC
  Administered 2017-03-11 – 2017-03-13 (×4): 3 mL via INTRAVENOUS

## 2017-03-11 MED ORDER — CLOPIDOGREL BISULFATE 75 MG PO TABS
75.0000 mg | ORAL_TABLET | Freq: Every day | ORAL | Status: DC
  Administered 2017-03-12 – 2017-03-13 (×2): 75 mg via ORAL
  Filled 2017-03-11 (×3): qty 1

## 2017-03-11 MED ORDER — ACETAMINOPHEN 650 MG RE SUPP: 650.0000 mg | Freq: Four times a day (QID) | RECTAL | Status: DC | PRN

## 2017-03-11 MED ORDER — INSULIN ASPART 100 UNIT/ML ~~LOC~~ SOLN
0.0000 [IU] | Freq: Three times a day (TID) | SUBCUTANEOUS | Status: DC
  Administered 2017-03-13: 1 [IU] via SUBCUTANEOUS

## 2017-03-11 MED ORDER — GLIPIZIDE ER 5 MG PO TB24
5.0000 mg | ORAL_TABLET | Freq: Every day | ORAL | Status: DC
  Administered 2017-03-12: 5 mg via ORAL
  Filled 2017-03-11 (×3): qty 1

## 2017-03-11 MED ORDER — DIGOXIN 125 MCG PO TABS
0.2500 mg | ORAL_TABLET | Freq: Every day | ORAL | Status: DC
  Administered 2017-03-11 – 2017-03-13 (×3): 0.25 mg via ORAL
  Filled 2017-03-11 (×3): qty 2

## 2017-03-11 MED ORDER — STROKE: EARLY STAGES OF RECOVERY BOOK
Freq: Once | Status: AC
  Administered 2017-03-11: 13:00:00

## 2017-03-11 MED ORDER — ENOXAPARIN SODIUM 40 MG/0.4ML ~~LOC~~ SOLN
40.0000 mg | SUBCUTANEOUS | Status: DC
  Administered 2017-03-11 – 2017-03-13 (×3): 40 mg via SUBCUTANEOUS
  Filled 2017-03-11 (×3): qty 0.4

## 2017-03-11 MED ORDER — FLEET ENEMA 7-19 GM/118ML RE ENEM
1.0000 | ENEMA | Freq: Once | RECTAL | Status: AC
  Administered 2017-03-11: 1 via RECTAL
  Filled 2017-03-11: qty 1

## 2017-03-11 MED ORDER — ROSUVASTATIN CALCIUM 5 MG PO TABS
10.0000 mg | ORAL_TABLET | Freq: Every day | ORAL | Status: DC
  Administered 2017-03-11 – 2017-03-13 (×3): 10 mg via ORAL
  Filled 2017-03-11 (×3): qty 2

## 2017-03-11 NOTE — ED Notes (Signed)
Checked CBG 144, RN Monique informed

## 2017-03-11 NOTE — ED Notes (Signed)
Meal tray and belongings transported with him.

## 2017-03-11 NOTE — H&P (Signed)
Date: 03/11/2017               Patient Name:  Nicholas [name] MRN: [id number]  DOB: July 16, 1951 Age / Sex: 66 y.o.,  male   PCP: [name], MD         Medical Service: Internal Medicine Teaching Service         Attending Physician: Dr. Tyson Alias, MD    First Contact: Dr. Peggyann Juba Pager: [phone]  Second Contact: Dr. Johnny Bridge Pager: [phone]       After Hours (After 5p/  First Contact Pager: [phone]  weekends / holidays): Second Contact Pager: [phone]   Chief Complaint: "can't walk"  History of Present Illness: Nicholas [name] is a 66 year old male with past medical history of CVA, hyperlipidemia, hypertension, CAD status post stent placement, history of tobacco abuse, known R carotid artery stenosis, diabetes and h/o SVT who presents to the emergency department with new onset right-sided weakness.  Yesterday around 1930, patient developed sudden onset of right-sided weakness in his lower extremity. He first noticed this when he got up from bed and ambulated to the bathroom, but fell to the ground from weakness. He denies hitting his head, but hit his right knee. Patient has chronic left lower extremity weakness as residual from his previous CVA in 2015. He denies any other residual deficits from the stroke. After his fall, patient called his daughter who came to his house to assist him. Patiently normally lives alone and completes all of his ADLs on his own. He does have to walk with a walker due to his chronic left lower extremity weakness. EMS was called who noted right-sided weakness, and aphasia, but this was later contributed to patient refusing to speak due to anger that EMS was there. Patient denied any other deficits including confusion, facial droop, difficult speech, weakness in the right or left upper extremity, or numbness. In the ED, CT head was without acute intracranial abnormalities but showed chronic right posterior parietal CVA and chronic left frontal lobe  CVA. The ED physician recommended admission for stroke workup, but patient left AMA. On arriving back at home, patient was unable to ambulate even with his walker and then conceded to returning back to the emergency department. Patient continues to have right lower extremity weakness and persistent left lower extremity weakness which is at his previous baseline. He denies any other deficits at this time. During my exam, patient developed a right upper extremity tremor which resolved with motion. He was alert and talking during this episode. He denies any previous history of tremor or seizure.  Patient denies fever, chills, headache, shortness of breath, chest pain, cough, nausea, vomiting, diarrhea, dysuria, urinary frequency, weight gain, weight loss, lightheadedness.  In the ED, patient was mildly hypertensive at 157/57, normal heart rate, normal respiratory rate, satting 99% on room air. CBC and basic metabolic panel were within normal limits. EKG showed sinus rhythm. UDS was negative and urinalysis was without signs of infection. Right knee x-ray showed osteoarthritis, but no acute changes area alcohol level is negative. Troponin negative.  Meds: No current facility-administered medications for this encounter.    Current Outpatient Prescriptions  Medication Sig Dispense Refill  . clopidogrel (PLAVIX) 75 MG tablet Take 1 tablet (75 mg total) by mouth daily with breakfast. 90 tablet 3  . dicyclomine (BENTYL) 20 MG tablet Take 1 tablet (20 mg total) by mouth 2 (two) times daily. 20 tablet 0  . digoxin (LANOXIN) 0.25 MG tablet Take  0.25 mg by mouth daily.    Marland Kitchen diltiazem (TAZTIA XT) 360 MG 24 hr capsule Take 1 capsule (360 mg total) by mouth daily. 30 capsule 1  . docusate sodium (COLACE) 50 MG capsule Take 50 mg by mouth at bedtime.     Marland Kitchen doxazosin (CARDURA) 2 MG tablet Take 1 tablet (2 mg total) by mouth daily. 30 tablet 1  . glipiZIDE (GLUCOTROL XL) 5 MG 24 hr tablet Take 1 tablet (5 mg total) by  mouth daily. 30 tablet 1  . metoprolol (LOPRESSOR) 50 MG tablet Take 1 tablet (50 mg total) by mouth 2 (two) times daily. 60 tablet 3  . rosuvastatin (CRESTOR) 10 MG tablet Take 1 tablet (10 mg total) by mouth daily. 30 tablet 1    Allergies: Allergies as of 03/11/2017  . (No Known Allergies)   Past Medical History:  Diagnosis Date  . At high risk for falls   . BPH (benign prostatic hypertrophy)    HX RETENTION  . Carotid artery occlusion    S/P RIGHT CEA  . Coronary artery disease CARDIOLOGIST--  DR Johney Frame   S/P  PCI TO LAD 1993  . GERD (gastroesophageal reflux disease)   . History of CVA (cerebrovascular accident)    1993---  NO RESIDUALS  . History of head injury    CONCUSSION--  NO RESIDUAL  . History of myocardial infarction    1993--  NON-Q WAVE  S/P PCI TO LAD  . Hydrocele, left   . Hyperlipidemia   . Hypertension   . Left carotid artery stenosis    MILD  . Myocardial infarction   . PSVT (paroxysmal supraventricular tachycardia) (HCC)   . PVD (peripheral vascular disease) (HCC)    S/P LEFT FEM-POP  . Right leg weakness    USES CANE--  SECONDARY TO PVD  . Type 2 diabetes mellitus (HCC)   . Wears glasses     Family History: Mother: CVA Father: CVA  Social History:  Tobacco Use: Previous smoker, 10 pack year history  Alcohol Use: Denies Illicit Drug Use: Denies  Review of Systems: A complete ROS was reviewed and negative except as per HPI.   Physical Exam: Blood pressure (!) 147/58, pulse 81, temperature 98.7 F (37.1 C), resp. rate (!) 26, SpO2 100 %. General: Vital signs reviewed.  Patient is in no acute distress and cooperative with exam.  Eyes: PERRL, conjunctivae normal, no scleral icterus.  Ears, Nose, Throat, and Mouth: Normal bilateral nasal turbinates. Normal posterior oropharynx. Moist mucous membranes.  Cardiovascular: RRR, 3/6 SEM radiating to bilateral carotids. No JVD or carotid bruit present. 2+ lower extremity edema bilaterally. Bilateral  radial and pedal pulses are intact and symmetric bilaterally.  Pulmonary: Clear to auscultation bilaterally, no wheezes, rales, or rhonchi. No accessory muscle use. Gastrointestinal: Soft, non-tender, non-distended, BS +, no guarding present.  Musculoskeletal: Mild tenderness on palpation of right knee, no effusion.  Neurologic: A&O x3, Strength is 5/5 in UE bilaterally, 4/5 in hip flexion and extension bilaterally, 4/5 in knee extension and flexion, 5/5 in plantarflexion and dorsiflexion bilaterally. Cranial nerve II-XII are grossly intact, no focal motor deficit, sensory intact to light touch bilaterally. Tremor in RUE which lasted for 5 seconds and resolved with movement.  Skin: Warm, dry and intact. No rashes or erythema. Psychiatric: Agitated mood and affect. speech and behavior is normal. Cognition and memory are normal.   EKG: Sinus Rhythm  CT Head wo contrast: No acute intracranial abnormalities. Chronic right posterior parietal lobe infarct and  chronic left frontal lobe infarct.  Right knee x-ray: Osteoarthritis  Assessment & Plan by Problem: Principal Problem:   Acute CVA (cerebrovascular accident) (HCC) Active Problems:   Hyperlipidemia   TOBACCO ABUSE   Essential hypertension   CAD, NATIVE VESSEL   Aortic valve disorder   Occlusion and stenosis of carotid artery   History of CVA (cerebrovascular accident)  Mr. Boivin is a 66 year old male with past medical history of CVA, hyperlipidemia, hypertension, CAD status post stent placement, history of tobacco abuse, carotid artery stenosis, diabetes who presents to the emergency department with new onset right-sided weakness.  Possible Acute CVA: Patient presents with new onset right lower extremity weakness. Patient has residual left lower extremity weakness from a prior CVA in 2015. Patient has several known risk factors including family history, personal history of CVA, hypertension, hyperlipidemia, known right carotid artery  stenosis and diabetes. He reports compliance with Plavix 75 mg daily and rosuvastatin 10 mg daily. CT head without contrast is without acute intracranial abnormalities. However, patient continues to have persistent right lower extremity weakness concerning for acute CVA. -Admit for stroke workup -Neurology consultation, appreciate recommendations -Continue Plavix 75 mg daily, may need to add aspirin if stroke is confirmed -Continue rosuvastatin 10 mg daily -MRI brain without contrast -MRA head -Lipid panel -Hemoglobin A1c -Allow for permissive hypertension -Physical therapy, occupational therapy, speech therapy -Carotid Dopplers, may need CT angiogram for further evaluation, especially of right carotid artery -Echocardiogram  Hypertension: Patient is on metoprolol 50 mg twice a day and diltiazem 360 mg daily at home. We will hold these for now to allow for permissive hypertension if stroke is confirmed. -Holding antihypertensives  Hyperlipidemia: Patient has a history of hyperlipidemia, but reports compliance with rosuvastatin 10 mg daily. -Continue rosuvastatin 10 mg daily -Lipid panel  T2DM: Last A1c 5.7 on 04/27/2014. Patient reports compliance with glipizide 5 mg daily. -SSI-S -Hemoglobin A1c -Continue glipizide 5 mg daily  CAD s/p stent placement: Patient has a history of CAD status post stent placement at age 22. He denies chest pain or shortness of breath. -Continue Plavix 75 mg daily -Continue rosuvastatin 10 mg daily, would consider increasing rosuvastatin to 20 mg daily for high intensity statin effect  H/o PSVT: Patient previously used to follow with cardiology for history of paroxysmal supraventricular tachycardia. It appears that he has not been seen since 2011. Patient remains on digoxin 0.25 mg daily, diltiazem 360 mg daily, and metoprolol 50 mg twice a day. We will hold diltiazem and metoprolol for permissive hypertension, but continue digoxin. Would add back diltiazem  and metoprolol when able. -Continue digoxin 0.25 mg daily -Holding diltiazem and metoprolol, add back when able  Aortic valve stenosis: 3/6 systolic ejection murmur radiating to the carotids on exam. Echocardiogram in 2015 showed mild to moderate stenosis. -Repeat echocardiogram  DVT/PE ppx: Lovenox SQ QD FEN: CODE: DO NOT RESUSCITATE  Dispo: Admit patient to Inpatient with expected length of stay greater than 2 midnights.  Signed: Karlene Lineman, DO PGY-3 Internal Medicine Resident Pager # [phone] 03/11/2017 10:38 AM

## 2017-03-11 NOTE — ED Triage Notes (Signed)
Pt just left ED AMA; pt was to get admitted for TIA per family but pt refused; pt was brought back due to not being able to walk; per family pt was unable to walk when he left AMA; No new complaints just pt is still unable to walk; pt states he wants to know be admitted;

## 2017-03-11 NOTE — ED Notes (Signed)
Updated family member on patient room assignment.

## 2017-03-11 NOTE — ED Notes (Signed)
PT has no needs at this time; family given ice.

## 2017-03-11 NOTE — ED Notes (Signed)
Attempted report x 2 

## 2017-03-11 NOTE — ED Notes (Signed)
Attempted report 

## 2017-03-11 NOTE — Progress Notes (Addendum)
Patient admitted to [phone]. Patient is alert, oriented x4, skin is intact, NIHHS 0, patient straining to pee, bladder scan showed >800 retained. Order for I&O obtained, I&o emptied 800cc yellow clear urine. States he has baseline painful urination, difficulty starting stream. Patient straining in bed, states he needs to have bowel movement. States last bm was this morning before coming into hospital, states bowel movement was hard. MD notified, order for fleets enema placed, fleets enema administered.

## 2017-03-11 NOTE — ED Notes (Signed)
Pt. Daughter [phone] please call when patient gets bed assignment.

## 2017-03-11 NOTE — ED Notes (Signed)
Pt attempted to use urinal. Pt cleaned and bed sheets changed.

## 2017-03-11 NOTE — Discharge Instructions (Signed)
Please read and follow all provided instructions.  Your diagnoses today include:  1. Transient cerebral ischemia, unspecified type     Tests performed today include:  CT of your head - no new strokes  Counts and electrolytes  Vital signs. See below for your results today.   Medications prescribed:   None  Take any prescribed medications only as directed.  Home care instructions:  Follow any educational materials contained in this packet.  Continue taking your Plavix every day.  Follow-up instructions: Please follow-up with your primary care provider as soon as you can for further evaluation.  Return instructions:   Please return to the Emergency Department if you experience worsening symptoms.   Return if you decide that you want hospitalization or further workup.  Return immediately if you have weakness in your arms or legs, slurred speech, trouble walking or talking, confusion, or trouble with your balance.   Please return if you have any other emergent concerns.  Additional Information:  Your vital signs today were: BP (!) 138/57    Pulse 75    Resp 18    Ht  (1.803 m)    Wt 117.9 kg    SpO2 97%    BMI 36.26 kg/m  If your blood pressure (BP) was elevated above 135/85 this visit, please have this repeated by your doctor within one month. --------------

## 2017-03-11 NOTE — Consult Note (Signed)
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 History is obtained from the patient who is a somewhat limited historian. His daughter is present at the bedside and offers additional information as needed.  The patient reports that he was in his usual state of health until yesterday evening. He states he was able to get up, clean his house and do laundry. After he finished these chores, he laid down in his bed to get some rest. When he tried to get up later, he realized that he could not use his right leg. His daughter apparently tried to contact between 7 and 8:00 last night at which point he told her that he was sitting on the floor and could not get up. When she came to check on him, she called EMS after noticing that he was weak on his right leg and leaning towards his right side when she tried to help him stand. He was brought to the [facility] emergency department. On examination, he was documented to have normal strength and normal reflexes with no neurological deficit. CT scan of the head was obtained and showed no acute abnormality. There is some concern that he may have suffered a TIA. However, the patient reported that he did not want to stay in the hospital because he still has not paid the bills from his previous admission. He left AGAINST MEDICAL ADVICE. Today, he states that he left because he thought that his leg will get better if he was at home. Unfortunately, this did not happen and he is returning to the emergency department today because of ongoing right leg weakness.  He denies any symptoms involving his right arm. The symptoms in his right leg are limited to weakness and he denies any numbness. He has chronic  weakness on the left side related to his previous stroke. He uses a cane when ambulating around his house and a Rollator when ambulating outside the home. He has not had any vision changes, speech changes, or difficulty communicating yesterday or today.  He states that he presently takes Plavix on a daily basis. He has a history of carotid artery disease and is status post endarterectomy on the right in 2004.  PMH:  Past Medical History:  Diagnosis Date  . At high risk for falls   . BPH (benign prostatic hypertrophy)    HX RETENTION  . Carotid artery occlusion    S/P RIGHT CEA  . Coronary artery disease CARDIOLOGIST--  DR Johney Frame   S/P  PCI TO LAD 1993  . GERD (gastroesophageal reflux disease)   . History of CVA (cerebrovascular accident)    1993---  NO RESIDUALS  . History of head injury    CONCUSSION--  NO RESIDUAL  . History of myocardial infarction    1993--  NON-Q WAVE  S/P PCI TO LAD  . Hydrocele, left   . Hyperlipidemia   . Hypertension   . Left carotid artery stenosis    MILD  . Myocardial infarction   . PSVT (paroxysmal supraventricular tachycardia) (HCC)   . PVD (peripheral vascular disease) (HCC)    S/P LEFT FEM-POP  . Right leg weakness    USES CANE--  SECONDARY TO PVD  . Type 2 diabetes mellitus (HCC)   .  Wears glasses     PSH:  Past Surgical History:  Procedure Laterality Date  . CARDIAC CATHETERIZATION  01-18-2003   DR Eden Emms   NON-OBSTRUCTIVE CAD/  PREVIOIS ANGIOPLASTY SITE WIDELY PATENT  . CARDIOVASCULAR STRESS TEST  2007   SMALL ANTERO-APICAL SCAR/  NO ISCHEMIA  . CAROTID ENDARTERECTOMY Right 05-31-2003  . CORONARY ANGIOPLASTY  1993   PCI TO LAD  . FEMORAL-POPLITEAL BYPASS GRAFT Left 01-19-2003  . HYDROCELE EXCISION Left 10/27/2013   Procedure: HYDROCELECTOMY ADULT;  Surgeon: Lindaann Slough, MD;  Location: [facility];  Service: Urology;  Laterality: Left;  . RIGHT HYDROCELECTOMY  05-31-2003  . TRANSTHORACIC ECHOCARDIOGRAM  08-24-2010    EF 55%/  MILD AORTIC INSUFFICENCY/  MODERATE LVH    Family history: Family History  Problem Relation Age of Onset  . Stroke Mother   . Hypertension Mother   . Hypertension Father     Social history:  Social History   Social History  . Marital status: Legally Separated    Spouse name: N/A  . Number of children: N/A  . Years of education: N/A   Occupational History  . Not on file.   Social History Main Topics  . Smoking status: Former Smoker    Years: 0.00    Types: Cigarettes    Quit date: 10/26/1993  . Smokeless tobacco: Never Used  . Alcohol use No  . Drug use: No  . Sexual activity: Not on file   Other Topics Concern  . Not on file   Social History Narrative  . No narrative on file    Current outpatient meds: Medications reviewed and reconciled.  Current Meds  Medication Sig  . clopidogrel (PLAVIX) 75 MG tablet Take 1 tablet (75 mg total) by mouth daily with breakfast.  . dicyclomine (BENTYL) 20 MG tablet Take 1 tablet (20 mg total) by mouth 2 (two) times daily.  . digoxin (LANOXIN) 0.25 MG tablet Take 0.25 mg by mouth daily.  Marland Kitchen diltiazem (TAZTIA XT) 360 MG 24 hr capsule Take 1 capsule (360 mg total) by mouth daily.  Marland Kitchen docusate sodium (COLACE) 50 MG capsule Take 50 mg by mouth at bedtime.   Marland Kitchen doxazosin (CARDURA) 2 MG tablet Take 1 tablet (2 mg total) by mouth daily.  Marland Kitchen glipiZIDE (GLUCOTROL XL) 5 MG 24 hr tablet Take 1 tablet (5 mg total) by mouth daily.  . metoprolol (LOPRESSOR) 50 MG tablet Take 1 tablet (50 mg total) by mouth 2 (two) times daily.  . rosuvastatin (CRESTOR) 10 MG tablet Take 1 tablet (10 mg total) by mouth daily.    Current inpatient meds: Medications reviewed and reconciled.  No current facility-administered medications for this encounter.    Current Outpatient Prescriptions  Medication Sig Dispense Refill  . clopidogrel (PLAVIX) 75 MG tablet Take 1 tablet (75 mg total) by mouth daily with breakfast. 90 tablet 3  . dicyclomine  (BENTYL) 20 MG tablet Take 1 tablet (20 mg total) by mouth 2 (two) times daily. 20 tablet 0  . digoxin (LANOXIN) 0.25 MG tablet Take 0.25 mg by mouth daily.    Marland Kitchen diltiazem (TAZTIA XT) 360 MG 24 hr capsule Take 1 capsule (360 mg total) by mouth daily. 30 capsule 1  . docusate sodium (COLACE) 50 MG capsule Take 50 mg by mouth at bedtime.     Marland Kitchen doxazosin (CARDURA) 2 MG tablet Take 1 tablet (2 mg total) by mouth daily. 30 tablet 1  . glipiZIDE (GLUCOTROL XL) 5 MG 24 hr tablet Take  1 tablet (5 mg total) by mouth daily. 30 tablet 1  . metoprolol (LOPRESSOR) 50 MG tablet Take 1 tablet (50 mg total) by mouth 2 (two) times daily. 60 tablet 3  . rosuvastatin (CRESTOR) 10 MG tablet Take 1 tablet (10 mg total) by mouth daily. 30 tablet 1    Allergies: No Known Allergies  ROS: As per HPI. A full 14-point review of systems was performed and is otherwise unremarkable.   PE:  BP (!) 147/58   Pulse 81   Temp 98.7 F (37.1 C) (Oral)   Resp (!) 26   SpO2 100%   General: WDWN, no acute distress. He is not happy to be in the hospital and is somewhat brusque. AAO x4. Speech clear, no dysarthria. No aphasia. Follows commands well. Affect is mildly irritable.  HEENT: Normocephalic. Neck supple without LAD. MMM, OP clear. Dentition good. Sclerae anicteric. No conjunctival injection.  CV: He has a sinus irregularity. He has a 2/6 systolic murmur that is loudest at the right upper sternal border and which radiates into the carotids. Carotid pulses full and symmetric, no bruits. Keloid noted over R carotid. Distal pulses 2+ and symmetric.  Lungs: CTAB.  Abdomen: Soft, non-distended, non-tender. Bowel sounds present x4.  Extremities: No C/C. Mild edema BLE. Neuro:  CN: Pupils are equal and round. They are symmetrically reactive from 3-->2 mm. EOMI without nystagmus. No reported diplopia. Facial sensation is intact to light touch. Face is symmetric at rest with normal strength and mobility. Hearing is intact to  conversational voice. Palate elevates symmetrically and uvula is midline. Voice is normal in tone, pitch and quality. Bilateral SCM and trapezii are 5/5. Tongue is midline with normal bulk and mobility.  Motor: Normal bulk, tone. Strength is notable for 4/5 strength with left hip flexion and left knee flexion. He has 2/5 strength with left ankle dorsiflexion and plantar flexion. Strength is 4+/5 with right hip flexion, knee flexion, and ankle dorsiflexion. Arms are 5/5. No tremor or other abnormal movements. He has a slight pronator drift on the left.  Sensation: Intact to light touch. DTRs: 2+, symmetric in the arms. Right lower extremity is 2+. Left lower extremity is 3+. Toes mute on the right, upgoing on the left.  Coordination: Finger-to-nose is slower on the left but without dysmetria. Finger taps are clumsy on the left.    Labs:  Lab Results  Component Value Date   WBC 9.7 03/10/2017   HGB 13.9 03/10/2017   HCT 40.2 03/10/2017   PLT 363 03/10/2017   GLUCOSE 99 03/10/2017   CHOL 112 04/27/2014   TRIG 103 04/27/2014   HDL 38 (L) 04/27/2014   LDLCALC 53 04/27/2014   ALT 26 03/10/2017   AST 26 03/10/2017   NA 141 03/10/2017   K 3.7 03/10/2017   CL 103 03/10/2017   CREATININE 1.17 03/10/2017   BUN 11 03/10/2017   CO2 30 03/10/2017   TSH 1.370 04/26/2014   INR 1.12 03/10/2017   HGBA1C 5.7 (H) 04/27/2014   Urine drug screen 03/10/17 negative Urinalysis 03/10/17 negative Troponin 0.03  Imaging:  I have personally and independently reviewed CT scan of the head without contrast from 03/10/17. This shows evidence of remote ischemic infarctions involving the right frontoparietal region and the parasagittal aspect of the right frontal lobe. Moderate chronic small vessel ischemic disease is present. There is mild calcification of the basal ganglia bilaterally. Mild diffuse generalized atrophy as per noted. No obvious acute abnormalities appreciated.  Assessment and Plan:  1. Acute Ischemic  Stroke: This is concerning for an acute stroke involving the left ACA territory. Known risk factors for cerebrovascular disease in this patient include prior stroke, coronary artery disease with MI, hyperlipidemia, hypertension, peripheral vascular disease, known carotid stenosis, diabetes, age. Additional workup will be ordered to include MRI brain, MRA of the head, carotid Dopplers, TTE, fasting lipids, and hemoglobin a1c. Further testing will be determined by results from these initial studies. Recommend continuing antiplatelet therapy with Lasix for secondary stroke prevention. Continue statin with goal LDL less than 70. Ensure adequate glucose control. Allow permissive hypertension in the acute phase, treating only SBP greater than 220 mmHg and/or DBP greater than 110 mmHg. Avoid fever and hyperglycemia as these can extend infarct. Initiate rehab services. DVT prophylaxis as needed.   2. Acute right leg weakness: This is concerning for acute stroke as noted above. PT/rehabilitation as needed.  3. Chronic left hemiparesis: This is due to a remote stroke and appears to be at his normal baseline. No acute issues.  4. Abnormality of gait: This is acute-on-chronic with new weakness of the right leg superimposed upon old left hemiparesis. At baseline, he uses a cane and Rollator for ambulation. PT/rehabilitation.  The patient would likely benefit from acute rehab services. Recommend consultation of PT/OT with consideration for PM&R consult as appropriate depending upon clinical progress and therapists' recommendations.   Fall risk: Risk factors for falls include age, new RLE weakness, impaired mobility/gait at baseline. Strict fall precautions. Limit psychoactive medications and sedating medications.  Needs outpatient neurology follow-up?: Outpatient neurology follow-up is recommended at the time of discharge.  This was discussed with the patient and his daughter. Education was provided on the diagnosis  and expected evaluation and treatment. They are in agreement with the plan as noted. They were given the opportunity to ask any questions and these were addressed to their satisfaction.   Thank you for this consultation. The stroke team will assume care of the patient beginning 03/12/17. Please call if any urgent questions or concerns.

## 2017-03-11 NOTE — ED Provider Notes (Signed)
[facility]-EMERGENCY DEPT Provider Note   CSN: [id number] Arrival date & time: 03/11/17  4098     History   Chief Complaint Chief Complaint  Patient presents with  . Transient Ischemic Attack    HPI Nicholas [name] is a 66 y.o. male.  HPI Patient was evaluated for right-sided weakness yesterday in the emergency department. The weakness started at that afternoon. Was found by daughter at 8 PM sitting on floor of his home. No trauma. Patient had CT head without any acute changes. Old right-sided CVA and chronic changes were present. Was going to be admitted for TIA workup but patient refused and appears confused at times. He specifically signed out AMA. Was taken home by his daughter. Patient returns now after not being able to walk when he returned home. States he never was able to walk after onset of symptoms yesterday. He normally walks with a cane. States he still feels his right leg is weak. Denies any numbness. Denies any visual changes. No speech changes. Daughter states patient has-been repetitive and confused at times. He specifically denies any headache, chest pain, abdominal pain. Past Medical History:  Diagnosis Date  . At high risk for falls   . BPH (benign prostatic hypertrophy)    HX RETENTION  . Carotid artery occlusion    S/P RIGHT CEA  . Coronary artery disease CARDIOLOGIST--  DR Johney Frame   S/P  PCI TO LAD 1993  . GERD (gastroesophageal reflux disease)   . History of CVA (cerebrovascular accident)    1993---  NO RESIDUALS  . History of head injury    CONCUSSION--  NO RESIDUAL  . History of myocardial infarction    1993--  NON-Q WAVE  S/P PCI TO LAD  . Hydrocele, left   . Hyperlipidemia   . Hypertension   . Left carotid artery stenosis    MILD  . Myocardial infarction   . PSVT (paroxysmal supraventricular tachycardia) (HCC)   . PVD (peripheral vascular disease) (HCC)    S/P LEFT FEM-POP  . Right leg weakness    USES CANE--  SECONDARY TO PVD  . Type 2 diabetes  mellitus (HCC)   . Wears glasses     Patient Active Problem List   Diagnosis Date Noted  . Acute CVA (cerebrovascular accident) (HCC) 03/11/2017  . Cognitive deficits, late effect of cerebrovascular disease 07/23/2014  . 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 CVA (cerebral infarction) 04/28/2014  . Stroke (HCC) 04/26/2014  . Acute encephalopathy 04/26/2014  . PSVT 09/15/2010  . AORTIC INSUFFICIENCY 08/09/2010  . CLAUDICATION 08/09/2010  . PALPITATIONS 08/09/2010  . HYPERLIPIDEMIA-MIXED 08/08/2010  . TOBACCO ABUSE 08/08/2010  . HYPERTENSION, UNSPECIFIED 08/08/2010  . CAD, NATIVE VESSEL 08/08/2010  . CAROTID ARTERY DISEASE 08/08/2010    Past Surgical History:  Procedure Laterality Date  . CARDIAC CATHETERIZATION  01-18-2003   DR Eden Emms   NON-OBSTRUCTIVE CAD/  PREVIOIS ANGIOPLASTY SITE WIDELY PATENT  . CARDIOVASCULAR STRESS TEST  2007   SMALL ANTERO-APICAL SCAR/  NO ISCHEMIA  . CAROTID ENDARTERECTOMY Right 05-31-2003  . CORONARY ANGIOPLASTY  1993   PCI TO LAD  . FEMORAL-POPLITEAL BYPASS GRAFT Left 01-19-2003  . HYDROCELE EXCISION Left 10/27/2013   Procedure: HYDROCELECTOMY ADULT;  Surgeon: Lindaann Slough, MD;  Location: [facility];  Service: Urology;  Laterality: Left;  . RIGHT HYDROCELECTOMY  05-31-2003  . TRANSTHORACIC ECHOCARDIOGRAM  08-24-2010   EF 55%/  MILD AORTIC INSUFFICENCY/  MODERATE LVH       Home Medications  Prior to Admission medications   Medication Sig Start Date End Date Taking? Authorizing Provider  clopidogrel (PLAVIX) 75 MG tablet Take 1 tablet (75 mg total) by mouth daily with breakfast. 05/14/14  Yes Daniel J Angiulli, PA-C  dicyclomine (BENTYL) 20 MG tablet Take 1 tablet (20 mg total) by mouth 2 (two) times daily. 10/09/15  Yes Alvira Monday, MD  digoxin (LANOXIN) 0.25 MG tablet Take 0.25 mg by mouth daily.   Yes Historical Provider, MD  diltiazem (TAZTIA XT) 360 MG 24 hr capsule Take 1 capsule (360 mg  total) by mouth daily. 05/14/14  Yes Daniel J Angiulli, PA-C  docusate sodium (COLACE) 50 MG capsule Take 50 mg by mouth at bedtime.    Yes Historical Provider, MD  doxazosin (CARDURA) 2 MG tablet Take 1 tablet (2 mg total) by mouth daily. 05/14/14  Yes Daniel J Angiulli, PA-C  glipiZIDE (GLUCOTROL XL) 5 MG 24 hr tablet Take 1 tablet (5 mg total) by mouth daily. 05/14/14  Yes Daniel J Angiulli, PA-C  metoprolol (LOPRESSOR) 50 MG tablet Take 1 tablet (50 mg total) by mouth 2 (two) times daily. 05/14/14  Yes Daniel J Angiulli, PA-C  rosuvastatin (CRESTOR) 10 MG tablet Take 1 tablet (10 mg total) by mouth daily. 05/14/14  Yes Mcarthur Rossetti Angiulli, PA-C    Family History Family History  Problem Relation Age of Onset  . Stroke Mother   . Hypertension Mother   . Hypertension Father     Social History Social History  Substance Use Topics  . Smoking status: Former Smoker    Years: 0.00    Types: Cigarettes    Quit date: 10/26/1993  . Smokeless tobacco: Never Used  . Alcohol use No     Allergies   Patient has no known allergies.   Review of Systems Review of Systems  Constitutional: Negative for chills and fever.  Eyes: Negative for visual disturbance.  Respiratory: Negative for shortness of breath.   Cardiovascular: Negative for chest pain.  Gastrointestinal: Negative for abdominal pain, diarrhea, nausea and vomiting.  Genitourinary: Negative for dysuria, flank pain and hematuria.  Musculoskeletal: Negative for back pain, myalgias and neck pain.  Skin: Negative for rash and wound.  Neurological: Positive for weakness. Negative for dizziness, light-headedness, numbness and headaches.  Psychiatric/Behavioral: Positive for confusion.  All other systems reviewed and are negative.    Physical Exam Updated Vital Signs BP (!) 154/57 (BP Location: Right Arm)   Pulse 74   Temp 98.7 F (37.1 C) (Oral)   Resp 20   SpO2 96%   Physical Exam  Constitutional: He is oriented to person, place,  and time. He appears well-developed and well-nourished. No distress.  HENT:  Head: Normocephalic and atraumatic.  Mouth/Throat: Oropharynx is clear and moist. No oropharyngeal exudate.  Eyes: EOM are normal. Pupils are equal, round, and reactive to light.  Neck: Normal range of motion. Neck supple.  Surgical scar on right lateral side of his neck consistent with prior surgery. Bruit versus radiation of murmur appreciated on the left side. No masses  Cardiovascular: Normal rate and regular rhythm.   Murmur heard. 5/5 Holosystolic murmur  Pulmonary/Chest: Effort normal and breath sounds normal. No respiratory distress. He has no wheezes. He has no rales. He exhibits no tenderness.  Abdominal: Soft. Bowel sounds are normal. There is no tenderness. There is no rebound and no guarding.  Musculoskeletal: Normal range of motion. He exhibits no edema or tenderness.  2+ pitting edema left lower extremity. Distal pulses are  intact.  Neurological: He is alert and oriented to person, place, and time.  0/5 motor left lower extremity. 5/5 motor bilateral upper extremity. Questionable mild right lower extremity weakness. Sensation to light touch is intact. Bilateral finger to nose testing intact. No facial asymmetry.  Skin: Skin is warm and dry. Capillary refill takes less than 2 seconds. No rash noted. No erythema.  Psychiatric: He has a normal mood and affect. His behavior is normal.  Nursing note and vitals reviewed.    ED Treatments / Results  Labs (all labs ordered are listed, but only abnormal results are displayed) Labs Reviewed  CBG MONITORING, ED - Abnormal; Notable for the following:       Result Value   Glucose-Capillary 144 (*)    All other components within normal limits    EKG  EKG Interpretation  Date/Time:  Monday March 11 2017 08:13:53 EDT Ventricular Rate:  72 PR Interval:    QRS Duration: 89 QT Interval:  348 QTC Calculation: 381 R Axis:   30 Text Interpretation:  Sinus  rhythm Ventricular premature complex Probable left atrial enlargement Probable anterior infarct, old Confirmed by Ranae Palms  MD, Deja Pisarski ([id number]) on 03/11/2017 8:19:20 AM       Radiology Ct Head Wo Contrast  Result Date: 03/10/2017 CLINICAL DATA:  Right-sided weakness.  Gait difficulty. EXAM: CT HEAD WITHOUT CONTRAST TECHNIQUE: Contiguous axial images were obtained from the base of the skull through the vertex without intravenous contrast. COMPARISON:  10/03/2014 FINDINGS: Brain: Chronic encephalomalacia within the right posterior parietal lobe is identified, image 22 of series 201. Remote left ACA territory infarct is unchanged from previous exam. There is prominence of the sulci and ventricles identified compatible with brain atrophy. No abnormal extra-axial fluid collection, intracranial hemorrhage or mass identified. Vascular: No hyperdense vessel or unexpected calcification. Skull: Normal. Negative for fracture or focal lesion. Sinuses/Orbits: No acute finding. Other: None. IMPRESSION: 1. No acute intracranial abnormality. 2. 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 Electronically Signed   By: Signa Kell M.D.   On: 03/10/2017 22:41   Dg Knee Complete 4 Views Right  Result Date: 03/10/2017 CLINICAL DATA:  Fall. EXAM: RIGHT KNEE - COMPLETE 4+ VIEW COMPARISON:  None. FINDINGS: There is sharpening of the tibial spines. 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 No acute fractures or subluxations identified. IMPRESSION: 1. Osteoarthritis. 2. No acute findings. Electronically Signed   By: Signa Kell M.D.   On: 03/10/2017 23:05    Procedures Procedures (including critical care time)  Medications Ordered in ED Medications - No data to display   Initial Impression / Assessment and Plan / ED Course  I have reviewed the triage vital signs and the nursing notes.  Pertinent labs & imaging results that were available during my care of the patient were reviewed by  me and considered in my medical decision making (see chart for details).    Discussed with Dr. Roxy Manns who will consult on the patient. Internal medicine teaching service will admit.   Final Clinical Impressions(s) / ED Diagnoses   Final diagnoses:  Weakness of right leg  Heart murmur    New Prescriptions New Prescriptions   No medications on file     Loren Racer, MD 03/11/17 [phone]

## 2017-03-12 ENCOUNTER — Inpatient Hospital Stay (HOSPITAL_COMMUNITY): Payer: Medicare Other

## 2017-03-12 DIAGNOSIS — Z7984 Long term (current) use of oral hypoglycemic drugs: Secondary | ICD-10-CM

## 2017-03-12 DIAGNOSIS — E119 Type 2 diabetes mellitus without complications: Secondary | ICD-10-CM

## 2017-03-12 DIAGNOSIS — I2510 Atherosclerotic heart disease of native coronary artery without angina pectoris: Secondary | ICD-10-CM

## 2017-03-12 DIAGNOSIS — Z7902 Long term (current) use of antithrombotics/antiplatelets: Secondary | ICD-10-CM

## 2017-03-12 DIAGNOSIS — G458 Other transient cerebral ischemic attacks and related syndromes: Secondary | ICD-10-CM

## 2017-03-12 DIAGNOSIS — Z8679 Personal history of other diseases of the circulatory system: Secondary | ICD-10-CM

## 2017-03-12 DIAGNOSIS — R339 Retention of urine, unspecified: Secondary | ICD-10-CM

## 2017-03-12 DIAGNOSIS — I639 Cerebral infarction, unspecified: Secondary | ICD-10-CM

## 2017-03-12 DIAGNOSIS — G459 Transient cerebral ischemic attack, unspecified: Secondary | ICD-10-CM

## 2017-03-12 DIAGNOSIS — I359 Nonrheumatic aortic valve disorder, unspecified: Secondary | ICD-10-CM

## 2017-03-12 DIAGNOSIS — Z79899 Other long term (current) drug therapy: Secondary | ICD-10-CM

## 2017-03-12 DIAGNOSIS — Z955 Presence of coronary angioplasty implant and graft: Secondary | ICD-10-CM

## 2017-03-12 DIAGNOSIS — M5416 Radiculopathy, lumbar region: Secondary | ICD-10-CM

## 2017-03-12 DIAGNOSIS — I350 Nonrheumatic aortic (valve) stenosis: Secondary | ICD-10-CM

## 2017-03-12 LAB — BASIC METABOLIC PANEL
ANION GAP: 8 (ref 5–15)
BUN: 9 mg/dL (ref 6–20)
CALCIUM: 9.3 mg/dL (ref 8.9–10.3)
CO2: 27 mmol/L (ref 22–32)
CREATININE: 1.13 mg/dL (ref 0.61–1.24)
Chloride: 105 mmol/L (ref 101–111)
GLUCOSE: 76 mg/dL (ref 65–99)
POTASSIUM: 3.6 mmol/L (ref 3.5–5.1)
Sodium: 140 mmol/L (ref 135–145)

## 2017-03-12 LAB — GLUCOSE, CAPILLARY
GLUCOSE-CAPILLARY: 97 mg/dL (ref 65–99)
Glucose-Capillary: 116 mg/dL — ABNORMAL HIGH (ref 65–99)

## 2017-03-12 LAB — LIPID PANEL
CHOLESTEROL: 111 mg/dL (ref 0–200)
HDL: 42 mg/dL (ref >40)
LDL CALC: 57 mg/dL (ref 0–99)
TRIGLYCERIDES: 61 mg/dL (ref <150)
Total CHOL/HDL Ratio: 2.6 RATIO
VLDL: 12 mg/dL (ref 0–40)

## 2017-03-12 MED ORDER — DILTIAZEM HCL ER COATED BEADS 180 MG PO CP24
360.0000 mg | ORAL_CAPSULE | Freq: Every day | ORAL | Status: DC
  Administered 2017-03-12 – 2017-03-13 (×2): 360 mg via ORAL
  Filled 2017-03-12 (×2): qty 2

## 2017-03-12 MED ORDER — METOPROLOL TARTRATE 50 MG PO TABS
50.0000 mg | ORAL_TABLET | Freq: Two times a day (BID) | ORAL | Status: DC
  Administered 2017-03-12 – 2017-03-13 (×4): 50 mg via ORAL
  Filled 2017-03-12 (×4): qty 1

## 2017-03-12 MED ORDER — DOXAZOSIN MESYLATE 2 MG PO TABS
2.0000 mg | ORAL_TABLET | Freq: Every day | ORAL | Status: DC
  Administered 2017-03-12 – 2017-03-13 (×2): 2 mg via ORAL
  Filled 2017-03-12 (×2): qty 1

## 2017-03-12 NOTE — Progress Notes (Signed)
PT Cancellation Note  Patient Details Name: Nicholas [name] MRN: [id number] DOB: 1951/10/06   Cancelled Treatment:    Reason Eval/Treat Not Completed: Patient at procedure or test/unavailable.  Will check back as schedule allows to complete PT eval.   Willaim Rayas 03/12/2017, 10:49 AM

## 2017-03-12 NOTE — Progress Notes (Signed)
Order placed for PO metoprolol. Given per order for HR. Will continue to monitor.

## 2017-03-12 NOTE — Progress Notes (Signed)
   Subjective: Feels much better after in-and-out catheterization and enema yesterday.  Can't tell if his leg weakness has resolved because he hasn't bee out of bed.  No headaches, numbness/tingling, diplopia, vertigo, or other symptoms.  He reports often having to stand and strain with micturation.  Has never seen a urologist.  Objective:  Vital signs in last 24 hours: Vitals:   03/11/17 1846 03/11/17 1938 03/12/17 0047 03/12/17 0543  BP: (!) 184/64 (!) 143/103 (!) 155/56 (!) 147/58  Pulse: 88 89 89 82  Resp: Temp:   98.9 F (37.2 C) 98.7 F (37.1 C)  TempSrc:   Oral Oral  SpO2: 92% 95% 96% 95%   Physical Exam  Constitutional: He appears well-developed and well-nourished. No distress.  Cardiovascular: Normal rate and regular rhythm.   3/6 systolic murmur loudest RUSB  Pulmonary/Chest: Effort normal and breath sounds normal.  Neurological:  Alert and oriented Face symmetric Normal phonation Hearing intact to casual conversation Strength 5/5 symmetric in bilateral UE and LE  Psychiatric:  Impatient, frustrated   Lipid Panel     Component Value Date/Time   CHOL 111 03/12/2017 0330   TRIG 61 03/12/2017 0330   HDL 42 03/12/2017 0330   CHOLHDL 2.6 03/12/2017 0330   VLDL 12 03/12/2017 0330   LDLCALC 57 03/12/2017 0330    MRI/MRA Brain 03/11/2016 IMPRESSION: 1. No acute intracranial abnormality. 2. Stable bilateral frontal and right parietal chronic infarctions. 3. Moderate to severe paranasal sinus mucosal thickening. 4. 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 5. Patent circle of Willis. Otherwise no evidence for high-grade stenosis, large vessel occlusion, or aneurysm.  Assessment/Plan:  Principal Problem:   Acute CVA (cerebrovascular accident) (HCC) Active Problems:   Hyperlipidemia   TOBACCO ABUSE   Essential hypertension   CAD, NATIVE VESSEL   Aortic valve  disorder   Occlusion and stenosis of carotid artery   History of CVA (cerebrovascular accident)   66 y.o. male with history of prior CVA and CAP and risk factors of HTN, HL, DM, and tobacco abuse who presents with new right sided weakness concerning for stroke vs TIA.  MRI does not show any acute infarction, and his deficits have now resolved.  Will investigate and optimize risk factors, appreciate forthcoming Stroke team evaluation and recommendations.  #TIA MRI/MRA without acute infarct -echo -carotid ultrasounds -f/u A1c -Continue home rosuvastatin, clopidogrel   #History of pSVT -Continue home digoxin -Resume home metoprolol and diltiazem -Telemetry  #Urinary Retention -PVRs -continue home doxazosin -I/O cath as needed -outpatient urology f/u  #Aortic Stenosis Last echo 2015, mild-moderate. -Repeat echo  #HTN -Resume home metoprolol and diltiazem  #CAD -Continue home rosuvastatin, clopidogrel  #DM2 -f/u A1c -Continue home glipizide 5 mg daily -SSI  Fluids: none Diet: NPO pending SLP eval DVT Prophylaxis: lovenox Code Status: DNR  Dispo: Anticipated discharge in approximately 1 day(s).   Nicholas Bustard, MD 03/12/2017, 6:17 AM Pager: [phone]

## 2017-03-12 NOTE — Progress Notes (Signed)
Rehab Admissions Coordinator Note:  Patient was screened by Trish Mage for appropriateness for an Inpatient Acute Rehab Consult.  At this time, an inpatient rehab consult has been ordered and is pending.  I will follow up once rehab MD has seen patient.   Trish Mage 03/12/2017, 3:35 PM  I can be reached at [phone].

## 2017-03-12 NOTE — Evaluation (Signed)
Occupational Therapy Evaluation Patient Details Name: Nicholas [name] MRN: [id number] DOB: 06/02/1951 Today's Date: 03/12/2017    History of Present Illness Pt is a 66 y.o. male admitted for R LE weakness.  MRI 03/12/2017 showed no significant acute findings.  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   Clinical Impression   PTA, pt reports independence with assistive devices for ADL and functional mobility. He utilized a cane in his home and Rollator for community mobility. Pt currently requires mod assist for grooming tasks and mod assist +2 for stand-pivot toilet transfers. He presents with poor motor planning skills, decreased R UE coordination, and decreased cognition impacting his ability to participate in ADL at [facility]. See comments below concerning episode of decreased alertness and cognitive status during session. Feel pt would be an excellent CIR candidate for continued rehabilitation in order to maximize return to PLOF. OT will continue to follow acutely in order to improve independence with ADL and functional mobility.    Follow Up Recommendations  CIR    Equipment Recommendations  Other (comment) (TBD)    Recommendations for Other Services       Precautions / Restrictions Precautions Precautions: Fall Restrictions Weight Bearing Restrictions: No      Mobility Bed Mobility Overal bed mobility: Needs Assistance Bed Mobility: Supine to Sit     Supine to sit: Min assist     General bed mobility comments: Pt determined to complete as independently as possible.  Transfers Overall transfer level: Needs assistance Equipment used: Rolling walker (2 wheeled) Transfers: Sit to/from [employer] Sit to Stand: Mod assist;+2 physical assistance Stand pivot transfers: Mod assist;+2 physical assistance       General transfer comment: Pt required physical assistance to move R LE  during transfer. Decreased coordination and motor planning limitng ability to complete.    Balance Overall balance assessment: Needs assistance Sitting-balance support: Bilateral upper extremity supported Sitting balance-Leahy Scale: Fair Sitting balance - Comments: Good initially and then prior to episode of decreased responsivity, pt beginning to lean with difficulty self-correcting. Postural control: Right lateral lean Standing balance support: Bilateral upper extremity supported Standing balance-Leahy Scale: Poor Standing balance comment: Reliant on RW                           ADL either performed or assessed with clinical judgement   ADL Overall ADL's : Needs assistance/impaired     Grooming: Moderate assistance;Oral care;Sitting;Cueing for sequencing Grooming Details (indicate cue type and reason): Decreased motor planning skills in the area of ideomotor and ideational praxis. Step-by-step cues and unable to sequence task despite verbal cues. Improved with hand over hand guidance for initiation. This task being completed as symptoms worsened prior to period of decreased responsivity. Upper Body Bathing: Moderate assistance;Cueing for sequencing;Sitting   Lower Body Bathing: Moderate assistance;Cueing for sequencing   Upper Body Dressing : Moderate assistance;Sitting   Lower Body Dressing: Moderate assistance;Sit to/from stand Lower Body Dressing Details (indicate cue type and reason): Able to physically don/doff socks but decreased ability to plan movement and raising L LE rather than R despite attempts to raise R as well as verbal and tactile cues. Toilet Transfer: Moderate assistance;+2 for safety/equipment;Stand-pivot;RW;BSC Toilet Transfer Details (indicate cue type and reason): Pt required physical assistance to move R LE. DIfficulty coordinating and planning movements. Toileting- Clothing Manipulation and Hygiene: Maximal assistance;Sit to/from stand  General ADL Comments: Pt initially with good participation and communication despite decreased coordination and motor planning. However, after sitting in chair for approximately 10 minutes, motor planning began to significantly decline and pt requiring hand over hand assistance to initiate tooth brushing tasks. Pt becoming frustrated and then less responsive. When asked how he was feeling, pt reported dizziness and therapist reclined pt. Pt closing eyes and becoming less arousable and called RN to room. Approximately 30 seconds later on her arrival, pt returned to original conversational state and was better able to control movements. During this episode decreased strength in R LE and UE noted.     Vision Patient Visual Report: No change from baseline Additional Comments: Pt reports no changes. Unable to assess this session due to period of decrease in cognitive function. Will continue to assess.      Perception     Praxis      Pertinent Vitals/Pain Pain Assessment: No/denies pain     Hand Dominance Right   Extremity/Trunk Assessment Upper Extremity Assessment Upper Extremity Assessment: RUE deficits/detail;LUE deficits/detail RUE Deficits / Details: Decreased gross motor coordination and strength. Slight dysmetria with finger to nose and difficulty following command to raise R UE vs. L. Required physical inhibition to prevent utilizing L UE when asked to use R.  RUE Coordination: decreased fine motor;decreased gross motor LUE Deficits / Details: 4+/5 strength, residual from prior CVA.   Lower Extremity Assessment Lower Extremity Assessment: Defer to PT evaluation;RLE deficits/detail;LLE deficits/detail RLE Deficits / Details: Pt with difficulty isolating R movement from L on commands. Able to move R LE in bed but during period of decreased responsiveness was unable to raise against gravity. Decreased coordination with stepping.  RLE Coordination: decreased fine motor;decreased gross  motor LLE Deficits / Details: Pt with difficulty isolating R LE movement from L. Unable to coordinate steps during transfer to chair. LLE Coordination: decreased gross motor       Communication Communication Communication: Other (comment) (Periods of decreased communication and alertness)   Cognition Arousal/Alertness: Awake/alert;Lethargic (Fluctuated (see ADL comments)) Behavior During Therapy: Anxious;Flat affect (fluctuating) Overall Cognitive Status: History of cognitive impairments - at baseline                                 General Comments: Pt initially conversational but after transfer to chair had a period of decreased responsivity. Pt with noted increasing difficulty with ideational and ideomotor praxia along with decreased use of R UE and then reporting "my brain isn't working right" followed by "I feel a little dizzy." Pt then closing eyes and becoming poorly arousable for approximately 30 seconds. Called RN to room and reclined pt attempting to arouse and after 30 seconds, pt alert and talking and laughing again.    General Comments       Exercises     Shoulder Instructions      Home Living Family/patient expects to be discharged to:: Private residence Living Arrangements: Alone (Reported alone to OT but spouse to PT) Available Help at Discharge: Family Type of Home: House Home Access: Level entry     Home Layout: One level     Bathroom Shower/Tub: [employer]: Standard Bathroom Accessibility: Yes How Accessible: Accessible via walker Home Equipment: Tub bench;Walker - 4 wheels;Cane - single point;Wheelchair - manual          Prior Functioning/Environment Level of Independence: Independent with assistive device(s)  Comments: Pt states he is doing own ADL's, using rollator outside and cane inside.        OT Problem List: Decreased strength;Decreased range of motion;Impaired balance (sitting and/or  standing);Decreased activity tolerance;Impaired vision/perception;Decreased coordination;Decreased cognition;Decreased safety awareness;Decreased knowledge of use of DME or AE;Decreased knowledge of precautions;Impaired UE functional use      OT Treatment/Interventions: Self-care/ADL training;Therapeutic exercise;Neuromuscular education;Energy conservation;DME and/or AE instruction;Therapeutic activities;Cognitive remediation/compensation;Patient/family education;Balance training;Visual/perceptual remediation/compensation    OT Goals(Current goals can be found in the care plan section) Acute Rehab OT Goals Patient Stated Goal: to get some sleep OT Goal Formulation: With patient Time For Goal Achievement: 03/26/17 Potential to Achieve Goals: Good ADL Goals Pt Will Perform Grooming: standing;with min guard assist Pt Will Perform Lower Body Bathing: with supervision;sit to/from stand Pt Will Perform Lower Body Dressing: with supervision;sit to/from stand Pt Will Transfer to Toilet: with supervision;ambulating;bedside commode (BSC over toilet) Pt Will Perform Toileting - Clothing Manipulation and hygiene: with min guard assist;sit to/from stand Additional ADL Goal #1: Pt will complete 3 grooming tasks at sink with no more than 2 verbal cues for sequencing.  OT Frequency: Min 3X/week   Barriers to D/C:            Co-evaluation              End of Session Equipment Utilized During Treatment: Gait belt;Rolling walker Nurse Communication: Mobility status (Decline in responsivity and then return to original state)  Activity Tolerance: Treatment limited secondary to medical complications (Comment);Patient limited by lethargy (See notes above concerning cognition) Patient left: in chair;with call bell/phone within reach;with chair alarm set  OT Visit Diagnosis: Hemiplegia and hemiparesis;Other symptoms and signs involving the nervous system (R29.898) Hemiplegia - Right/Left:  Right Hemiplegia - dominant/non-dominant: Dominant Hemiplegia - caused by: Unspecified                Time: 7846-9629 OT Time Calculation (min): 35 min Charges:  OT General Charges $OT Visit: 1 Procedure OT Evaluation $OT Eval Moderate Complexity: 1 Procedure OT Treatments $Self Care/Home Management : 8-22 mins G-Codes:     Doristine Section, MS OTR/L  Pager: [phone]   Murphy Duzan A Valiant Dills 03/12/2017, 5:08 PM

## 2017-03-12 NOTE — Progress Notes (Signed)
Internal Medicine Attending:   I saw and examined the patient. I reviewed the resident's note and I agree with the resident's findings and plan as documented in the resident's note.  66 year old man admitted for evaluation of right leg weakness. MRI is without acute CVA changes. Likely diagnosis is TIA at this point. Right leg weakness is resolved. Neurology is following. Or PT, OT evaluations today, carotid ultrasound and echo as well. Continue medical management with Plavix and atorvastatin for now.  Recurrent urinary retention is a significant issue. He had an and now With 800 cc yesterday. Please repeat post void residual today. The patient is at risk for needing a indwelling Foley catheter to bridge until he can have urology follow-up.

## 2017-03-12 NOTE — Consult Note (Addendum)
Physical Medicine and Rehabilitation Consult  Reason for Consult: Right foot drop with difficulty walking in patient with prior stroke with left hemiparesis.  Referring Physician: Dr. Lawerance Bach   HPI: Nicholas [name] is a 66 y.o. male with history of CAD, T2DM with neuropathy, L-CVA with gait disorder and problems with initiation (CIR in 07/2014), concussion, CAD, back injury with left foot drop who was admitted on 03/11/17 with acute RLE weakness and urinary retention.  MRI/MRA brain done revealing no acute abnormality and stable bilateral frontal and right parietal chronic infarcts. Dr. Pearlean Brownie was consulted for input and recommended lumbar spine due to concerns of lumbar spine disease v/s peroneal neuropathy as doubted stroke/TIA as cause of symptoms. MRI lumbar spine showed congenitally narrow lumbar spine with superimposed mild to moderate disc and facet degeneration, moderate spinal stenosis L3-4 and moderate to severe neural foraminal stenosis at left L3-4 and bilaterally at L5-SI. PT/OT evaluation done revealing deficits in mobility and ability to carry out ADL as well as cognitive deficits affecting tasks. CIR recommended for follow up therapy.   According to patient and daughter, patient was ambulating with a cane in the home and currently is nonambulatory.  Review of Systems  Constitutional: Negative.  Negative for chills and fever.  HENT: Negative.  Negative for hearing loss and nosebleeds.   Eyes: Negative.   Respiratory: Negative for cough, hemoptysis, sputum production and stridor.   Cardiovascular: Negative for chest pain, palpitations and orthopnea.  Gastrointestinal: Negative for heartburn and nausea.       +incont  Skin: Negative.  Negative for itching and rash.  Neurological: Positive for focal weakness. Negative for dizziness, tingling and loss of consciousness.  Endo/Heme/Allergies: Negative for environmental allergies and polydipsia.  Psychiatric/Behavioral: The patient  has insomnia. The patient is not nervous/anxious.       Past Medical History:  Diagnosis Date  . At high risk for falls   . BPH (benign prostatic hypertrophy)    HX RETENTION  . Carotid artery occlusion    S/P RIGHT CEA  . Coronary artery disease CARDIOLOGIST--  DR Johney Frame   S/P  PCI TO LAD 1993  . GERD (gastroesophageal reflux disease)   . History of CVA (cerebrovascular accident)    1993---  NO RESIDUALS  . History of head injury    CONCUSSION--  NO RESIDUAL  . History of myocardial infarction    1993--  NON-Q WAVE  S/P PCI TO LAD  . Hydrocele, left   . Hyperlipidemia   . Hypertension   . Left carotid artery stenosis    MILD  . Myocardial infarction   . PSVT (paroxysmal supraventricular tachycardia) (HCC)   . PVD (peripheral vascular disease) (HCC)    S/P LEFT FEM-POP  . Right leg weakness    USES CANE--  SECONDARY TO PVD  . Type 2 diabetes mellitus (HCC)   . Wears glasses     Past Surgical History:  Procedure Laterality Date  . CARDIAC CATHETERIZATION  01-18-2003   DR Eden Emms   NON-OBSTRUCTIVE CAD/  PREVIOIS ANGIOPLASTY SITE WIDELY PATENT  . CARDIOVASCULAR STRESS TEST  2007   SMALL ANTERO-APICAL SCAR/  NO ISCHEMIA  . CAROTID ENDARTERECTOMY Right 05-31-2003  . CORONARY ANGIOPLASTY  1993   PCI TO LAD  . FEMORAL-POPLITEAL BYPASS GRAFT Left 01-19-2003  . HYDROCELE EXCISION Left 10/27/2013   Procedure: HYDROCELECTOMY ADULT;  Surgeon: Lindaann Slough, MD;  Location: [facility];  Service: Urology;  Laterality: Left;  . RIGHT HYDROCELECTOMY  05-31-2003  . TRANSTHORACIC ECHOCARDIOGRAM  08-24-2010   EF 55%/  MILD AORTIC INSUFFICENCY/  MODERATE LVH    Family History  Problem Relation Age of Onset  . Stroke Mother   . Hypertension Mother   . Hypertension Father     Social History:  reports that he quit smoking about 23 years ago. His smoking use included Cigarettes. He quit after 0.00 years of use. He has never used smokeless tobacco. He reports that he  does not drink alcohol or use drugs.    Allergies: No Known Allergies    Medications Prior to Admission  Medication Sig Dispense Refill  . clopidogrel (PLAVIX) 75 MG tablet Take 1 tablet (75 mg total) by mouth daily with breakfast. 90 tablet 3  . dicyclomine (BENTYL) 20 MG tablet Take 1 tablet (20 mg total) by mouth 2 (two) times daily. 20 tablet 0  . digoxin (LANOXIN) 0.25 MG tablet Take 0.25 mg by mouth daily.    Marland Kitchen diltiazem (TAZTIA XT) 360 MG 24 hr capsule Take 1 capsule (360 mg total) by mouth daily. 30 capsule 1  . docusate sodium (COLACE) 50 MG capsule Take 50 mg by mouth at bedtime.     Marland Kitchen doxazosin (CARDURA) 2 MG tablet Take 1 tablet (2 mg total) by mouth daily. 30 tablet 1  . glipiZIDE (GLUCOTROL XL) 5 MG 24 hr tablet Take 1 tablet (5 mg total) by mouth daily. 30 tablet 1  . metoprolol (LOPRESSOR) 50 MG tablet Take 1 tablet (50 mg total) by mouth 2 (two) times daily. 60 tablet 3  . rosuvastatin (CRESTOR) 10 MG tablet Take 1 tablet (10 mg total) by mouth daily. 30 tablet 1    Home: Home Living Family/patient expects to be discharged to:: Private residence Living Arrangements: Spouse/significant other Available Help at Discharge: Family Type of Home: House Home Access: Level entry Home Layout: One level Bathroom Shower/Tub: Tub/shower unit Home Equipment: Tub bench, Environmental consultant - 4 wheels, Cane - single point, Wheelchair - manual  Functional History: Prior Function Level of Independence: Independent with assistive device(s) Comments: Pt states he is doing own ADL's, using rollator outside and cane inside. Functional Status:  Mobility: Bed Mobility Overal bed mobility: Needs Assistance Bed Mobility: Supine to Sit, Sit to Sidelying, Rolling Rolling: Mod assist Sidelying to sit: Mod assist Supine to sit: Modified independent (Device/Increase time) Sit to sidelying: Mod assist, +2 for physical assistance (Pt was resisted physical assist, unclear whether due to tone) General bed  mobility comments: pt needed more assistance with return to supine than for supine to sit        ADL:    Cognition: Cognition Overall Cognitive Status: History of cognitive impairments - at baseline Orientation Level: Oriented to person, Oriented to place, Disoriented to time, Oriented to situation Cognition Arousal/Alertness: Awake/alert Behavior During Therapy: Anxious, Flat affect Overall Cognitive Status: History of cognitive impairments - at baseline General Comments: Pt's cognitive status declined after sitting at EOB for about 2 min.  This includes decreased response to commands and questions.  When instructed to put on socks, pt applied to L foot successfully, but unsuccessful with R and tried to apply sock to L foot again x 8.  Wife stated she noticed similar decline in pt response with sitting and better responses in supine. Difficult to assess due to: Impaired communication   Blood pressure (!) 150/48, pulse 71, temperature 97.8 F (36.6 C), temperature source Oral, resp. rate 16, height  (1.803 m), weight 118.5 kg (261 lb  4.8 oz), SpO2 94 %. Physical Exam   General: No acute distress Mood and affect are appropriate Heart: Regular rate and rhythm no rubs murmurs or extra sounds Lungs: Clear to auscultation, breathing unlabored, no rales or wheezes Abdomen: Positive bowel sounds, soft nontender to palpation, nondistended Extremities: No clubbing, cyanosis, or edema Skin: No evidence of breakdown, no evidence of rash Neurologic: Cranial nerves II through XII intact, motor strength is 5/5 in bilateral deltoid, bicep, tricep, grip, 2- R, 4 L hip flexor,3- R, 4 L knee extensors, 0 R, trace L ankle dorsiflexor and 0R, 0L plantar flexor, 0 EHL Bilat Delayed response to questions but answers appropriately Sensory exam normal sensation to light touch  in bilateral upper and absent left L4,5 S1 Left , intact Right  lower extremities Cerebellar exam normal finger to nose to  finger Musculoskeletal:no jt swelling no pain with Knee hip or ankle ROM  Results for orders placed or performed during the hospital encounter of 03/11/17 (from the past 24 hour(s))  Glucose, capillary     Status: None   Collection Time: 03/11/17  5:09 PM  Result Value Ref Range   Glucose-Capillary 97 65 - 99 mg/dL  Glucose, capillary     Status: Abnormal   Collection Time: 03/11/17  9:10 PM  Result Value Ref Range   Glucose-Capillary 108 (H) 65 - 99 mg/dL   Comment 1 Notify RN    Comment 2 Document in Chart   Lipid panel     Status: None   Collection Time: 03/12/17  3:30 AM  Result Value Ref Range   Cholesterol 111 0 - 200 mg/dL   Triglycerides 61 <355 mg/dL   HDL 42 >73 mg/dL   Total CHOL/HDL Ratio 2.6 RATIO   VLDL 12 0 - 40 mg/dL   LDL Cholesterol 57 0 - 99 mg/dL  Basic metabolic panel     Status: None   Collection Time: 03/12/17  3:30 AM  Result Value Ref Range   Sodium 140 135 - 145 mmol/L   Potassium 3.6 3.5 - 5.1 mmol/L   Chloride 105 101 - 111 mmol/L   CO2 27 22 - 32 mmol/L   Glucose, Bld 76 65 - 99 mg/dL   BUN 9 6 - 20 mg/dL   Creatinine, Ser 2.20 0.61 - 1.24 mg/dL   Calcium 9.3 8.9 - 25.4 mg/dL   GFR calc non Af Amer >60 >60 mL/min   GFR calc Af Amer >60 >60 mL/min   Anion gap 8 5 - 15  Glucose, capillary     Status: None   Collection Time: 03/12/17 11:54 AM  Result Value Ref Range   Glucose-Capillary 97 65 - 99 mg/dL   Ct Head Wo Contrast  Result Date: 03/10/2017 CLINICAL DATA:  Right-sided weakness.  Gait difficulty. EXAM: CT HEAD WITHOUT CONTRAST TECHNIQUE: Contiguous axial images were obtained from the base of the skull through the vertex without intravenous contrast. COMPARISON:  10/03/2014 FINDINGS: Brain: Chronic encephalomalacia within the right posterior parietal lobe is identified, image 22 of series 201. Remote left ACA territory infarct is unchanged from previous exam. There is prominence of the sulci and ventricles identified compatible with brain  atrophy. No abnormal extra-axial fluid collection, intracranial hemorrhage or mass identified. Vascular: No hyperdense vessel or unexpected calcification. Skull: Normal. Negative for fracture or focal lesion. Sinuses/Orbits: No acute finding. Other: None. IMPRESSION: 1. No acute intracranial abnormality. 2. 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 Electronically Signed   By: Signa Kell  M.D.   On: 03/10/2017 22:41   Mr Brain Wo Contrast  Result Date: 03/12/2017 CLINICAL DATA:  66 y/o M; sudden onset of right-sided weakness in the lower extremity. EXAM: MRI HEAD WITHOUT CONTRAST MRA HEAD WITHOUT CONTRAST TECHNIQUE: Multiplanar, multiecho pulse sequences of the brain and surrounding structures were obtained without intravenous contrast. Angiographic images of the head were obtained using MRA technique without contrast. COMPARISON:  MRI and MRA of the head dated 04/26/2014. 03/10/2017 CT of the head. FINDINGS: MRI HEAD FINDINGS Brain: No abnormal diffusion restriction to suggest acute or early subacute infarction. Stable encephalomalacia in the superior bilateral frontal lobes and right parietal lobe compatible sequelae of chronic infarction. Background of mild chronic microvascular ischemic changes of white matter and brain parenchymal volume loss. There is mild hemosiderin staining of the chronic infarctions. No new susceptibility hypointensity is identified to suggest intracranial hemorrhage. Stable normal ventricle size. No extra-axial collection. No effacement of basilar cisterns. Vascular: As below. Skull and upper cervical spine: Normal marrow signal. Sinuses/Orbits: Moderate to severe diffuse paranasal sinus mucosal thickening. No abnormal signal of the mastoid air cells. Orbits are unremarkable. Other: None. 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 Patent left internal  carotid artery. Anterior cerebral arteries:  Patent. Middle cerebral arteries: Patent. Anterior communicating artery: Patent. Posterior communicating arteries:  Patent. Posterior cerebral arteries:  Patent. Basilar artery:  Patent. Vertebral arteries:  Patent. No evidence of high-grade stenosis, large vessel occlusion, or aneurysm unless noted above. IMPRESSION: 1. No acute intracranial abnormality. 2. Stable bilateral frontal and right parietal chronic infarctions. 3. Moderate to severe paranasal sinus mucosal thickening. 4. 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 5. Patent circle of Willis. Otherwise no evidence for high-grade stenosis, large vessel occlusion, or aneurysm. Electronically Signed   By: Mitzi Hansen M.D.   On: 03/12/2017 00:53   Mr Lumbar Spine Wo Contrast  Result Date: 03/12/2017 CLINICAL DATA:  Right foot drop.  Urinary retention. EXAM: MRI LUMBAR SPINE WITHOUT CONTRAST TECHNIQUE: Multiplanar, multisequence MR imaging of the lumbar spine was performed. No intravenous contrast was administered. COMPARISON:  CT abdomen and pelvis 10/09/2015 FINDINGS: Segmentation:  Standard. Alignment:  Normal. Vertebrae: No evidence of fracture, suspicious osseous lesion, or significant marrow edema. Mild degenerative endplate marrow changes at L5-S1, predominantly type 2. Conus medullaris: Extends to the upper L2 level and appears normal. Fatty filum. Paraspinal and other soft tissues: Unremarkable. Disc levels: Disc desiccation from L2-3 to L5-S1 without significant disc space height loss. Diffuse narrowing of the lumbar spinal canal on a congenital basis due to short pedicles. T12-L1 and L1-2:  Negative. L2-3: Mild disc bulging and slight facet hypertrophy result in mild bilateral neural foraminal stenosis without significant spinal stenosis. L3-4: Circumferential disc bulging, congenitally short pedicles, and  moderate facet and ligamentum flavum hypertrophy result in moderate spinal stenosis, left greater than right lateral recess stenosis, and mild-to-moderate right and moderate to severe left neural foraminal stenosis. L4-5: Circumferential disc bulging, congenitally short pedicles, and moderate facet and ligamentum flavum hypertrophy result in mild spinal stenosis, moderate left greater than right lateral recess stenosis, and mild-to-moderate bilateral neural foraminal stenosis. Bilateral paracentral annular fissures. L5-S1: Circumferential disc bulging, superimposed small central disc protrusion, and mild facet and ligamentum flavum hypertrophy result in mild bilateral lateral recess stenosis and moderate to severe bilateral neural foraminal stenosis without significant spinal stenosis. IMPRESSION:  1. Congenitally narrow lumbar spinal canal with superimposed mild-to-moderate disc and facet degeneration. 2. Moderate spinal stenosis at L3-4. 3. Moderate lateral recess stenosis at L4-5. 4. Moderate to severe neural foraminal stenosis on the left at L3-4 and bilaterally at L5-S1. Electronically Signed   By: Sebastian Ache M.D.   On: 03/12/2017 11:42   Dg Knee Complete 4 Views Right  Result Date: 03/10/2017 CLINICAL DATA:  Fall. EXAM: RIGHT KNEE - COMPLETE 4+ VIEW COMPARISON:  None. FINDINGS: There is sharpening of the tibial spines. 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 No acute fractures or subluxations identified. IMPRESSION: 1. Osteoarthritis. 2. No acute findings. Electronically Signed   By: Signa Kell M.D.   On: 03/10/2017 23:05   Mr Maxine Glenn Headm  Result Date: 03/12/2017 CLINICAL DATA:  66 y/o M; sudden onset of right-sided weakness in the lower extremity. EXAM: MRI HEAD WITHOUT CONTRAST MRA HEAD WITHOUT CONTRAST TECHNIQUE: Multiplanar, multiecho pulse sequences of the brain and surrounding structures were obtained without intravenous contrast. Angiographic images of the head were obtained using  MRA technique without contrast. COMPARISON:  MRI and MRA of the head dated 04/26/2014. 03/10/2017 CT of the head. FINDINGS: MRI HEAD FINDINGS Brain: No abnormal diffusion restriction to suggest acute or early subacute infarction. Stable encephalomalacia in the superior bilateral frontal lobes and right parietal lobe compatible sequelae of chronic infarction. Background of mild chronic microvascular ischemic changes of white matter and brain parenchymal volume loss. There is mild hemosiderin staining of the chronic infarctions. No new susceptibility hypointensity is identified to suggest intracranial hemorrhage. Stable normal ventricle size. No extra-axial collection. No effacement of basilar cisterns. Vascular: As below. Skull and upper cervical spine: Normal marrow signal. Sinuses/Orbits: Moderate to severe diffuse paranasal sinus mucosal thickening. No abnormal signal of the mastoid air cells. Orbits are unremarkable. Other: None. 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 Patent left internal carotid artery. Anterior cerebral arteries:  Patent. Middle cerebral arteries: Patent. Anterior communicating artery: Patent. Posterior communicating arteries:  Patent. Posterior cerebral arteries:  Patent. Basilar artery:  Patent. Vertebral arteries:  Patent. No evidence of high-grade stenosis, large vessel occlusion, or aneurysm unless noted above. IMPRESSION: 1. No acute intracranial abnormality. 2. Stable bilateral frontal and right parietal chronic infarctions. 3. Moderate to severe paranasal sinus mucosal thickening. 4. 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 5. Patent circle of Willis. Otherwise no evidence for high-grade stenosis, large vessel occlusion, or aneurysm. Electronically Signed   By: Mitzi Hansen M.D.    On: 03/12/2017 00:53    Assessment/Plan: Diagnosis: Acute right foot drop as well as right quadricep weakness, history of lumbar spinal stenosis with recent fall, superimposed on prior left foot drop from spinal stenosis. 1. Does the need for close, 24 hr/day medical supervision in concert with the patient's rehab needs make it unreasonable for this patient to be served in a less intensive setting? Yes 2. Co-Morbidities requiring supervision/potential complications: Hypertension, history of coronary artery disease, history of cognitive deficits related to prior CVA, bowel and bladder incontinence may be neurogenic 3. Due to bladder management, bowel management, safety, skin/wound care, disease management, medication administration, pain management and patient education, does the patient require 24 hr/day rehab nursing? Yes 4. Does the patient require coordinated care of a physician, rehab nurse, PT (1-2 hrs/day, 5 days/week) and OT (1-2 hrs/day, 5 days/week) to address physical  and functional deficits in the context of the above medical diagnosis(es)? Yes Addressing deficits in the following areas: balance, endurance, locomotion, strength, transferring, bowel/bladder control, bathing, dressing, feeding, grooming, toileting, cognition and psychosocial support 5. Can the patient actively participate in an intensive therapy program of at least 3 hrs of therapy per day at least 5 days per week? Yes 6. The potential for patient to make measurable gains while on inpatient rehab is good 7. Anticipated functional outcomes upon discharge from inpatient rehab are supervision  with PT, supervision with OT, n/a with SLP. 8. Estimated rehab length of stay to reach the above functional goals is: 10-14 days 9. Does the patient have adequate social supports and living environment to accommodate these discharge functional goals? Yes 10. Anticipated D/C setting: Home 11. Anticipated post D/C treatments: HH  therapy 12. Overall Rehab/Functional Prognosis: good  RECOMMENDATIONS: This patient's condition is appropriate for continued rehabilitative care in the following setting: CIR Patient has agreed to participate in recommended program. Yes Note that insurance prior authorization may be required for reimbursement for recommended care.  Comment: Discussed with primary service, no surgical intervention was recommended  Erick Colace M.D. [facility] FAAPM&R (Sports Med, Neuromuscular Med) Diplomate Am Board of Electrodiagnostic Med  Jerene Pitch 03/12/2017

## 2017-03-12 NOTE — Progress Notes (Signed)
Pt HR ST sustaining in the 130's. No c/o pain, discomfort, SOB, resting comfortably. MD paged and made aware.

## 2017-03-12 NOTE — Progress Notes (Signed)
STROKE TEAM PROGRESS NOTE   SUBJECTIVE (INTERVAL HISTORY) No family is at the bedside.  Patient reports new onset R foot drop. Proximal strength good. Pt with spinal dz per 2001 imaging. Likely not stroke but back etiology. Patient is a poor historian. Today to me she denied any history of back pain, radicular pain, confusion and weakness. The patient's daughter who arrived after rounds informed me that he was slightly confused and had both right arm and right leg weakness. The patient's nurse informed me that patient complained about back pain and leg weakness related to that  OBJECTIVE Temp:  [98.6 F (37 C)-98.9 F (37.2 C)] 98.7 F (37.1 C) (04/03 0543) Pulse Rate:  [82-105] 82 (04/03 0543) Cardiac Rhythm: Normal sinus rhythm (04/03 0700) Resp:  [15-24] 16 (04/03 0543) BP: (143-189)/(50-103) 147/58 (04/03 0543) SpO2:  [92 %-98 %] 95 % (04/03 0543)  CBC:  Recent Labs Lab 03/10/17 2312  WBC 9.7  NEUTROABS 6.6  HGB 13.9  HCT 40.2  MCV 97.8  PLT 363    Basic Metabolic Panel:  Recent Labs Lab 03/10/17 2355 03/12/17 0330  NA 141 140  K 3.7 3.6  CL 103 105  CO2 30 27  GLUCOSE 99 76  BUN 11 9  CREATININE 1.17 1.13  CALCIUM 9.4 9.3   HgbA1c:  Lab Results  Component Value Date   HGBA1C 5.7 (H) 04/27/2014   PHYSICAL EXAM Frail middle aged african [place] male not in distress.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 Afebrile. Head is nontraumatic. Neck is supple without bruit.    Cardiac exam no murmur or gallop. Lungs are clear to auscultation. Distal pulses are well felt. Neurological Exam :  Awake alert oriented 3. No dysarthria. Diminished attention, registration and recall. Follows commands well. Extraocular movements are full range and nystagmus. Fundi were not visualized. Blinks to threat bilaterally. Face is symmetric without weakness. Hearing appears normal. Tongue is midline. Motor system exam reveals no upper or lower extremity drift but does have weakness  of left grip and intrinsic hand muscles as well as left ankle dorsiflexors. Right upper extremity strength seems normal. He has right foot drop with grade 1-2/5 strength of ankle dorsiflexors. He has good strength and the hip and knees on the right side. Patient has positive Tinel sign over the right peroneal groove. Deep tendon reflexes are 2+ on the left 1+ on the right. Plantars are both downgoing. Sensation appears intact bilaterally. Gait was not tested. ASSESSMENT/PLAN [name] is a 66 y.o. male with history of HTN, DB, HLD, stroke, CAD, SVT and spinal disease presenting with inability to stand/walk d/t R leg weakness. He did not receive IV t-PA.   Lumbar spine disease  Versus peroneal neuropathy likely with resultant foot drop, doubt new Stroke/TIA as patient's history is variable and MRI scan is negative for stroke  Resultant  R foot drop with preserved proximal leg strength  CT head no acute infarct. Old R post parietal and L frontal lobe infarcts  MRI  No acute stroke. Old B frontal and R parietal infarcts. Sinus disease.  MRA  Chronic R ICA occlusion.  Canceled Carotid Doppler  &  2D Echo    R knee xray OA   MRI lumbar spine pending   CT lumbar spine 03/2000 mod severe spinal stenosis L 3-4, L 4-5 w/ bulging discs and sm canal  MRI lumbar spine 01/2000 multifactorial central canal stenosis and mod lateral recess stenosis (mild L3-4, mod L 4-5)  mod stenosis medial mid aspects L5-S1 L>R  LDL 57  HgbA1c pending  Lovenox 40 mg sq daily for VTE prophylaxis  Diet Carb Modified Fluid consistency: Thin; Room service appropriate? 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, thrombotic d/t R ICA occlusion  Family hx stroke (mother)  Coronary artery disease hx MI, PCI, stent  PVD s/p R CEA in past  Aortic stenosis  Other Active Problems  Hx SVT  Urinary retention  Hospital day # 1  BIBY,SHARON  [facility] See Amion for Pager information 03/12/2017 11:31 AM  I have personally examined this patient, reviewed notes, independently viewed imaging studies, participated in medical decision making and plan of care.ROS completed by me personally and pertinent positives fully documented  I have made any additions or clarifications directly to the above note. Agree with note above.  Patient's history is extremely variable but brain MRI scan does not show an acute stroke. He has remote history of lumbar spinal stenosis and clinical exam at present suggest right foot drop with positive Tinel sign over the right peroneal groove making a referral cause of right to leg weakness more likely. Recommend check MRI scan of the lumbar spine. I had a long discussion the patient and his daughter about plan for evaluation and answered questions. Greater than 50% time during this 35 minute visit was spent on discussion of differential diagnosis of right leg weakness, plan for evaluation and answering questions Delia Heady, MD Medical Director [facility] Pager: [phone] 03/12/2017 1:45 PM  To contact Stroke Continuity provider, please refer to [email or web address]. After hours, contact General Neurology

## 2017-03-12 NOTE — Evaluation (Signed)
Physical Therapy Evaluation Patient Details Name: Nicholas [name] MRN: [id number] DOB: 26-Jul-1951 Today's Date: 03/12/2017   History of Present Illness  Pt is a 66 y.o. male admitted for R LE weakness. Brain MRI 03/12/2017 showed no significant acute findings.  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  Clinical Impression  Pt shows chronic left-sided weakness and new right sided weakness, especially in the LE.  He demonstrates functional limitations in cognition, mobility, strength, and coordination (see PT Problem list) requiring mod assist for most transfers.  He would benefit from continued PT services at the acute level and at the venue listed below, to return to baseline functioning.    Follow Up Recommendations CIR    Equipment Recommendations  Other (comment) (TBD)    Recommendations for Other Services Rehab consult     Precautions / Restrictions Precautions Precautions: Fall Restrictions Weight Bearing Restrictions: No      Mobility  Bed Mobility Overal bed mobility: Needs Assistance Bed Mobility: Supine to Sit;Sit to Sidelying;Rolling Rolling: Mod assist Sidelying to sit: Mod assist Supine to sit: Modified independent (Device/Increase time)   Sit to sidelying: Mod assist;+2 for physical assistance (Pt was resisted physical assist, unclear whether due to tone) General bed mobility comments: pt needed more assistance with return to supine than for supine to sit  Transfers                    Ambulation/Gait                Stairs            Wheelchair Mobility    Modified Rankin (Stroke Patients Only)       Balance Overall balance assessment: Needs assistance Sitting-balance support: Bilateral upper extremity supported Sitting balance-Leahy Scale: Fair Sitting balance - Comments: pt states dizziness and lightheadedness with sitting                                      Pertinent Vitals/Pain Pain Assessment: Faces Faces Pain Scale: Hurts a little bit Pain Location: grimacing and frustration with general movements Pain Descriptors / Indicators: Discomfort;Grimacing Pain Intervention(s): Limited activity within patient's tolerance;Repositioned    Home Living Family/patient expects to be discharged to:: Private residence Living Arrangements: Spouse/significant other Available Help at Discharge: Family Type of Home: House Home Access: Level entry     Home Layout: One level Home Equipment: Tub bench;Walker - 4 wheels;Cane - single point;Wheelchair - manual      Prior Function Level of Independence: Independent with assistive device(s)         Comments: Pt states he is doing own ADL's, using rollator outside and cane inside.     Hand Dominance   Dominant Hand: Right    Extremity/Trunk Assessment   Upper Extremity Assessment Upper Extremity Assessment: RUE deficits/detail;LUE deficits/detail;Generalized weakness RUE Deficits / Details: 4/5 strength.  New deficits. RUE Coordination: decreased fine motor;decreased gross motor LUE Deficits / Details: 4+/5 strength, residual from prior CVA.    Lower Extremity Assessment Lower Extremity Assessment: RLE deficits/detail;Generalized weakness;Difficult to assess due to impaired cognition;LLE deficits/detail RLE Deficits / Details: unable to complete MMT because pt could not follow commands. RLE Coordination: decreased fine motor;decreased gross motor LLE Deficits / Details: unable to complete MMT because pt could not follow commands. LLE Coordination: decreased gross motor  Cervical / Trunk Assessment Cervical / Trunk Assessment: Kyphotic  Communication   Communication: Other (comment) (See cognition section)  Cognition Arousal/Alertness: Awake/alert Behavior During Therapy: Anxious;Flat affect Overall Cognitive Status: History of cognitive impairments - at  baseline                                 General Comments: Pt's cognitive status declined after sitting at EOB for about 2 min.  This includes decreased response to commands and questions.  When instructed to put on socks, pt applied to L foot successfully, but unsuccessful with R and tried to apply sock to L foot again x 8.  Wife stated she noticed similar decline in pt response with sitting and better responses in supine.      General Comments General comments (skin integrity, edema, etc.): BP was obtained in sitting: 138/58 when pt states dizziness. pt family stated that pt had baseline deficits in initiating tasks including stand <> sit.    Exercises     Assessment/Plan    PT Assessment Patient needs continued PT services  PT Problem List Decreased strength;Decreased activity tolerance;Decreased mobility;Decreased balance;Decreased coordination;Decreased cognition;Decreased knowledge of use of DME;Decreased safety awareness       PT Treatment Interventions DME instruction;Gait training;Stair training;Functional mobility training;Therapeutic activities;Therapeutic exercise;Balance training;Neuromuscular re-education;Cognitive remediation;Patient/family education    PT Goals (Current goals can be found in the Care Plan section)  Acute Rehab PT Goals PT Goal Formulation: Patient unable to participate in goal setting Time For Goal Achievement: 03/26/17 Potential to Achieve Goals: Fair    Frequency Min 5X/week   Barriers to discharge        Co-evaluation               End of Session Equipment Utilized During Treatment: Gait belt Activity Tolerance: Other (comment) (limited by dizziness) Patient left: in bed;with family/visitor present;with bed alarm set   PT Visit Diagnosis: Dizziness and giddiness (R42);Hemiplegia and hemiparesis;Other symptoms and signs involving the nervous system (R29.898) Hemiplegia - Right/Left: Right (LE only) Hemiplegia -  dominant/non-dominant: Dominant Hemiplegia - caused by: Unspecified    Time: 1136-1207 PT Time Calculation (min) (ACUTE ONLY): 31 min   Charges:         PT G Codes:        Willaim Rayas SPT  Willaim Rayas 03/12/2017, 2:20 PM

## 2017-03-13 ENCOUNTER — Inpatient Hospital Stay (HOSPITAL_COMMUNITY)
Admission: AD | Admit: 2017-03-13 | Discharge: 2017-03-21 | DRG: 552 | Disposition: A | Discharge status: Home Health | Payer: Medicare Other | Source: Intra-hospital | Attending: Physical Medicine & Rehabilitation | Admitting: Physical Medicine & Rehabilitation

## 2017-03-13 ENCOUNTER — Encounter (HOSPITAL_COMMUNITY): Payer: Self-pay

## 2017-03-13 ENCOUNTER — Inpatient Hospital Stay (HOSPITAL_COMMUNITY): Payer: Medicare Other

## 2017-03-13 DIAGNOSIS — B964 Proteus (mirabilis) (morganii) as the cause of diseases classified elsewhere: Secondary | ICD-10-CM | POA: Diagnosis not present

## 2017-03-13 DIAGNOSIS — F4320 Adjustment disorder, unspecified: Secondary | ICD-10-CM

## 2017-03-13 DIAGNOSIS — Z7984 Long term (current) use of oral hypoglycemic drugs: Secondary | ICD-10-CM | POA: Diagnosis not present

## 2017-03-13 DIAGNOSIS — R3915 Urgency of urination: Secondary | ICD-10-CM

## 2017-03-13 DIAGNOSIS — I252 Old myocardial infarction: Secondary | ICD-10-CM

## 2017-03-13 DIAGNOSIS — E785 Hyperlipidemia, unspecified: Secondary | ICD-10-CM

## 2017-03-13 DIAGNOSIS — Z79899 Other long term (current) drug therapy: Secondary | ICD-10-CM

## 2017-03-13 DIAGNOSIS — E1140 Type 2 diabetes mellitus with diabetic neuropathy, unspecified: Secondary | ICD-10-CM

## 2017-03-13 DIAGNOSIS — M21371 Foot drop, right foot: Secondary | ICD-10-CM

## 2017-03-13 DIAGNOSIS — N401 Enlarged prostate with lower urinary tract symptoms: Secondary | ICD-10-CM

## 2017-03-13 DIAGNOSIS — N390 Urinary tract infection, site not specified: Secondary | ICD-10-CM

## 2017-03-13 DIAGNOSIS — I2510 Atherosclerotic heart disease of native coronary artery without angina pectoris: Secondary | ICD-10-CM

## 2017-03-13 DIAGNOSIS — R269 Unspecified abnormalities of gait and mobility: Secondary | ICD-10-CM | POA: Diagnosis not present

## 2017-03-13 DIAGNOSIS — R4189 Other symptoms and signs involving cognitive functions and awareness: Secondary | ICD-10-CM | POA: Diagnosis not present

## 2017-03-13 DIAGNOSIS — M48062 Spinal stenosis, lumbar region with neurogenic claudication: Secondary | ICD-10-CM | POA: Diagnosis not present

## 2017-03-13 DIAGNOSIS — Z9861 Coronary angioplasty status: Secondary | ICD-10-CM

## 2017-03-13 DIAGNOSIS — K5901 Slow transit constipation: Secondary | ICD-10-CM | POA: Diagnosis not present

## 2017-03-13 DIAGNOSIS — R531 Weakness: Secondary | ICD-10-CM

## 2017-03-13 DIAGNOSIS — R338 Other retention of urine: Secondary | ICD-10-CM

## 2017-03-13 DIAGNOSIS — R29898 Other symptoms and signs involving the musculoskeletal system: Secondary | ICD-10-CM

## 2017-03-13 DIAGNOSIS — R451 Restlessness and agitation: Secondary | ICD-10-CM | POA: Diagnosis not present

## 2017-03-13 DIAGNOSIS — M48061 Spinal stenosis, lumbar region without neurogenic claudication: Secondary | ICD-10-CM | POA: Diagnosis present

## 2017-03-13 DIAGNOSIS — I10 Essential (primary) hypertension: Secondary | ICD-10-CM

## 2017-03-13 DIAGNOSIS — M5416 Radiculopathy, lumbar region: Secondary | ICD-10-CM

## 2017-03-13 DIAGNOSIS — Z7902 Long term (current) use of antithrombotics/antiplatelets: Secondary | ICD-10-CM | POA: Diagnosis not present

## 2017-03-13 DIAGNOSIS — A499 Bacterial infection, unspecified: Secondary | ICD-10-CM | POA: Diagnosis not present

## 2017-03-13 DIAGNOSIS — M21372 Foot drop, left foot: Secondary | ICD-10-CM

## 2017-03-13 DIAGNOSIS — Z87891 Personal history of nicotine dependence: Secondary | ICD-10-CM | POA: Diagnosis not present

## 2017-03-13 DIAGNOSIS — I69398 Other sequelae of cerebral infarction: Secondary | ICD-10-CM | POA: Diagnosis not present

## 2017-03-13 DIAGNOSIS — Z8673 Personal history of transient ischemic attack (TIA), and cerebral infarction without residual deficits: Secondary | ICD-10-CM

## 2017-03-13 DIAGNOSIS — G459 Transient cerebral ischemic attack, unspecified: Secondary | ICD-10-CM

## 2017-03-13 LAB — GLUCOSE, CAPILLARY
GLUCOSE-CAPILLARY: 113 mg/dL — AB (ref 65–99)
Glucose-Capillary: 108 mg/dL — ABNORMAL HIGH (ref 65–99)
Glucose-Capillary: 148 mg/dL — ABNORMAL HIGH (ref 65–99)

## 2017-03-13 LAB — ECHOCARDIOGRAM LIMITED
FS: 34 % (ref 28–44)
HEIGHTINCHES: 71 in
IV/PV OW: 0.85
LA ID, A-P, ES: 40 mm
LA diam end sys: 40 mm
LA diam index: 1.61 cm/m2
LDCA: 3.46 cm2
LV PW d: 13 mm — AB (ref 0.6–1.1)
LVOT diameter: 21 mm
WEIGHTICAEL: 4180.8 [oz_av]

## 2017-03-13 LAB — URINALYSIS, ROUTINE W REFLEX MICROSCOPIC
BILIRUBIN URINE: NEGATIVE
GLUCOSE, UA: 50 mg/dL — AB
HGB URINE DIPSTICK: NEGATIVE
KETONES UR: NEGATIVE mg/dL
Leukocytes, UA: NEGATIVE
Nitrite: NEGATIVE
PROTEIN: NEGATIVE mg/dL
Specific Gravity, Urine: 1.021 (ref 1.005–1.030)
pH: 5 (ref 5.0–8.0)

## 2017-03-13 LAB — HEMOGLOBIN A1C
Hgb A1c MFr Bld: 5.4 % (ref 4.8–5.6)
Mean Plasma Glucose: 108 mg/dL

## 2017-03-13 MED ORDER — ROSUVASTATIN CALCIUM 10 MG PO TABS
10.0000 mg | ORAL_TABLET | Freq: Every day | ORAL | Status: DC
  Administered 2017-03-14 – 2017-03-21 (×8): 10 mg via ORAL
  Filled 2017-03-13 (×8): qty 1

## 2017-03-13 MED ORDER — METOPROLOL TARTRATE 50 MG PO TABS
50.0000 mg | ORAL_TABLET | Freq: Two times a day (BID) | ORAL | Status: DC
  Administered 2017-03-13 – 2017-03-21 (×13): 50 mg via ORAL
  Filled 2017-03-13 (×16): qty 1

## 2017-03-13 MED ORDER — BISACODYL 10 MG RE SUPP
10.0000 mg | Freq: Every day | RECTAL | Status: DC | PRN
  Administered 2017-03-15: 10 mg via RECTAL
  Filled 2017-03-13: qty 1

## 2017-03-13 MED ORDER — TRAZODONE HCL 50 MG PO TABS
25.0000 mg | ORAL_TABLET | Freq: Every evening | ORAL | Status: DC | PRN
  Filled 2017-03-13: qty 1

## 2017-03-13 MED ORDER — FLEET ENEMA 7-19 GM/118ML RE ENEM: 1.0000 | ENEMA | Freq: Once | RECTAL | Status: DC | PRN

## 2017-03-13 MED ORDER — DOXAZOSIN MESYLATE 2 MG PO TABS
2.0000 mg | ORAL_TABLET | Freq: Every day | ORAL | Status: DC
  Administered 2017-03-14 – 2017-03-21 (×7): 2 mg via ORAL
  Filled 2017-03-13 (×9): qty 1

## 2017-03-13 MED ORDER — ACETAMINOPHEN 325 MG PO TABS
325.0000 mg | ORAL_TABLET | ORAL | Status: DC | PRN
  Filled 2017-03-13: qty 2

## 2017-03-13 MED ORDER — TRAMADOL HCL 50 MG PO TABS
50.0000 mg | ORAL_TABLET | Freq: Four times a day (QID) | ORAL | Status: DC | PRN
  Administered 2017-03-20: 50 mg via ORAL
  Filled 2017-03-13: qty 1

## 2017-03-13 MED ORDER — KETOROLAC TROMETHAMINE 30 MG/ML IJ SOLN
30.0000 mg | Freq: Once | INTRAMUSCULAR | Status: AC
  Administered 2017-03-13: 30 mg via INTRAVENOUS

## 2017-03-13 MED ORDER — DILTIAZEM HCL ER COATED BEADS 180 MG PO CP24
360.0000 mg | ORAL_CAPSULE | Freq: Every day | ORAL | Status: DC
  Administered 2017-03-14 – 2017-03-21 (×8): 360 mg via ORAL
  Filled 2017-03-13 (×8): qty 2

## 2017-03-13 MED ORDER — PROCHLORPERAZINE MALEATE 5 MG PO TABS: 5.0000 mg | ORAL_TABLET | Freq: Four times a day (QID) | ORAL | Status: DC | PRN

## 2017-03-13 MED ORDER — GUAIFENESIN-DM 100-10 MG/5ML PO SYRP: 5.0000 mL | ORAL_SOLUTION | Freq: Four times a day (QID) | ORAL | Status: DC | PRN

## 2017-03-13 MED ORDER — ALUM & MAG HYDROXIDE-SIMETH 200-200-20 MG/5ML PO SUSP
30.0000 mL | ORAL | Status: DC | PRN
  Filled 2017-03-13: qty 30

## 2017-03-13 MED ORDER — CLOPIDOGREL BISULFATE 75 MG PO TABS
75.0000 mg | ORAL_TABLET | Freq: Every day | ORAL | Status: DC
  Administered 2017-03-14 – 2017-03-21 (×8): 75 mg via ORAL
  Filled 2017-03-13 (×8): qty 1

## 2017-03-13 MED ORDER — DIGOXIN 125 MCG PO TABS
0.2500 mg | ORAL_TABLET | Freq: Every day | ORAL | Status: DC
  Administered 2017-03-14 – 2017-03-21 (×8): 0.25 mg via ORAL
  Filled 2017-03-13 (×9): qty 2

## 2017-03-13 MED ORDER — LORAZEPAM 2 MG/ML IJ SOLN: 1.0000 mg | INTRAMUSCULAR | Status: DC | PRN

## 2017-03-13 MED ORDER — KETOROLAC TROMETHAMINE 30 MG/ML IJ SOLN
INTRAMUSCULAR | Status: AC
  Administered 2017-03-13: 30 mg via INTRAVENOUS
  Filled 2017-03-13: qty 1

## 2017-03-13 MED ORDER — POLYETHYLENE GLYCOL 3350 17 G PO PACK
17.0000 g | PACK | Freq: Every day | ORAL | Status: DC | PRN
  Administered 2017-03-15: 17 g via ORAL
  Filled 2017-03-13: qty 1

## 2017-03-13 MED ORDER — LORAZEPAM 2 MG/ML IJ SOLN
INTRAMUSCULAR | Status: AC
  Filled 2017-03-13: qty 1

## 2017-03-13 MED ORDER — PROCHLORPERAZINE EDISYLATE 5 MG/ML IJ SOLN: 5.0000 mg | Freq: Four times a day (QID) | INTRAMUSCULAR | Status: DC | PRN

## 2017-03-13 MED ORDER — DIPHENHYDRAMINE HCL 12.5 MG/5ML PO ELIX: 12.5000 mg | ORAL_SOLUTION | Freq: Four times a day (QID) | ORAL | Status: DC | PRN

## 2017-03-13 MED ORDER — ENOXAPARIN SODIUM 40 MG/0.4ML ~~LOC~~ SOLN
40.0000 mg | SUBCUTANEOUS | Status: DC
  Administered 2017-03-14 – 2017-03-21 (×8): 40 mg via SUBCUTANEOUS
  Filled 2017-03-13 (×8): qty 0.4

## 2017-03-13 MED ORDER — CYCLOBENZAPRINE HCL 5 MG PO TABS: 5.0000 mg | ORAL_TABLET | Freq: Three times a day (TID) | ORAL | Status: DC | PRN

## 2017-03-13 MED ORDER — PROCHLORPERAZINE 25 MG RE SUPP: 12.5000 mg | Freq: Four times a day (QID) | RECTAL | Status: DC | PRN

## 2017-03-13 NOTE — PMR Pre-admission (Signed)
PMR Admission Coordinator Pre-Admission Assessment  Patient: Nicholas [name] is an 66 y.o., male MRN: [id number] DOB: [birth year] Height: 5\' 11"  (180.3 cm) Weight: 118.5 kg (261 lb 4.8 oz)              Insurance Information HMO: No   PPO:       PCP:       IPA:       80/20:       OTHER:   PRIMARY:  Medicare A/B      Policy#:  [id number]      Subscriber:  patient CM Name:        Phone#:       Fax#:   Pre-Cert#:        Employer:  Disabled Benefits:  Phone #:       Name: Checked in [place]. Date: 06/09/02     Deduct:  $1340      Out of Pocket Max:  none      Life Max: unlimited CIR: 100%      SNF: 100 days Outpatient: 80%     Co-Pay: 20% Home Health: 100%      Co-Pay: none DME: 80%     Co-Pay: 20% Providers: patient's choice  SECONDARY:       Policy#:       Subscriber:  CM Name:       Phone#:      Fax#:  Pre-Cert#:       Employer:  Benefits:  Phone #:      Name:  Eff. Date:      Deduct:       Out of Pocket Max:       Life Max:  CIR:       SNF:  Outpatient:      Co-Pay:  Home Health:       Co-Pay:  DME:      Co-Pay:   Emergency Contact Information Contact Information    Name Relation Home Work Mobile   [name],Nicholas Daughter [phone]  [phone]   [name],Nicholas Relative   [phone]   Nicholas, [name] [phone]       Current Medical History  Patient Admitting Diagnosis:  Spinal stenosis, right leg weakness and foot drop  History of Present Illness: A 66 y.o.malewith history of CAD, T2DM with neuropathy, L-CVA with gait disorder and problems with initiation (CIR in 07/2014), concussion, CAD, back injury with left foot dropwho was admitted on 03/11/17 with acute RLE weakness and urinary retention. MRI/MRA brain done revealing no acute abnormality and stable bilateral frontal and right parietal chronic infarcts. Dr. Pearlean Brownie was consulted for input and recommended lumbar spine due to concerns of lumbar spine disease v/s peroneal neuropathy as doubted  stroke/TIA as cause of symptoms. MRI lumbar spine showed congenitally narrow lumbar spine with superimposed mild to moderate disc and facet degeneration, moderate spinal stenosis L3-4 and moderate to severe neural foraminal stenosis at left L3-4 and bilaterally at L5-SI.  2 D echo with EF 60-65% with severely calcified leaflets with moderate AR. Per reports --case discussed with Dr. Franky Macho and thoracic MRI recommended.  PT/OT evaluation done revealing deficits in mobility and ability to carry out ADL as well as cognitive deficits affecting tasks. CIR recommended for follow up therapy.     Past Medical History  Past Medical History:  Diagnosis Date  . At high risk for falls   . BPH (benign prostatic hypertrophy)    HX RETENTION  . Carotid artery occlusion    S/P  RIGHT CEA  . Coronary artery disease CARDIOLOGIST--  DR Johney Frame   S/P  PCI TO LAD 1993  . GERD (gastroesophageal reflux disease)   . History of CVA (cerebrovascular accident)    1993---  NO RESIDUALS  . History of head injury    CONCUSSION--  NO RESIDUAL  . History of myocardial infarction    1993--  NON-Q WAVE  S/P PCI TO LAD  . Hydrocele, left   . Hyperlipidemia   . Hypertension   . Left carotid artery stenosis    MILD  . Myocardial infarction   . PSVT (paroxysmal supraventricular tachycardia) (HCC)   . PVD (peripheral vascular disease) (HCC)    S/P LEFT FEM-POP  . Right leg weakness    USES CANE--  SECONDARY TO PVD  . Type 2 diabetes mellitus (HCC)   . Wears glasses     Family History  family history includes Hypertension in his father and mother; Stroke in his mother.  Prior Rehab/Hospitalizations: No previous rehab.  Has the patient had major surgery during 100 days prior to admission? No  Current Medications   Current Facility-Administered Medications:  .  acetaminophen (TYLENOL) tablet 650 mg, 650 mg, Oral, Q6H PRN **OR** acetaminophen (TYLENOL) suppository 650 mg, 650 mg, Rectal, Q6H PRN, Alexa R Burns,  MD .  clopidogrel (PLAVIX) tablet 75 mg, 75 mg, Oral, Q breakfast, Alexa R Burns, MD, 75 mg at 03/13/17 0851 .  digoxin (LANOXIN) tablet 0.25 mg, 0.25 mg, Oral, Daily, Alexa R Burns, MD, 0.25 mg at 03/13/17 0900 .  diltiazem (CARDIZEM CD) 24 hr capsule 360 mg, 360 mg, Oral, Daily, Thomasene Lot, MD, 360 mg at 03/13/17 0900 .  doxazosin (CARDURA) tablet 2 mg, 2 mg, Oral, Daily, Alexa R Burns, MD, 2 mg at 03/13/17 0900 .  enoxaparin (LOVENOX) injection 40 mg, 40 mg, Subcutaneous, Q24H, Alexa R Burns, MD, 40 mg at 03/13/17 1524 .  insulin aspart (novoLOG) injection 0-9 Units, 0-9 Units, Subcutaneous, TID WC, Alexa R Lawerance Bach, MD, 1 Units at 03/13/17 1743 .  LORazepam (ATIVAN) injection 1 mg, 1 mg, Intravenous, Q5 Min x 2 PRN, Alm Bustard, MD .  metoprolol (LOPRESSOR) tablet 50 mg, 50 mg, Oral, BID, Thomasene Lot, MD, 50 mg at 03/13/17 0900 .  rosuvastatin (CRESTOR) tablet 10 mg, 10 mg, Oral, Daily, Alexa R Burns, MD, 10 mg at 03/13/17 0900 .  sodium chloride flush (NS) 0.9 % injection 3 mL, 3 mL, Intravenous, Q12H, Alexa R Burns, MD, 3 mL at 03/13/17 0901  Patients Current Diet: Diet Carb Modified Fluid consistency: Thin; Room service appropriate? Yes Diet - low sodium heart healthy  Precautions / Restrictions Precautions Precautions: Fall Restrictions Weight Bearing Restrictions: No   Has the patient had 2 or more falls or a fall with injury in the past year?Yes. Patient reports 2 falls at his home recently with injury to his bottom.  Prior Activity Level Community (5-7x/wk): Walked to [employer] daily with his walker.  Went to the store and bank 1-2 X a week, not driving.    Home Assistive Devices / Equipment Home Assistive Devices/Equipment: Dan Humphreys (specify type) Home Equipment: Tub bench, Walker - 4 wheels, Cane - single point, Wheelchair - manual  Prior Device Use: Indicate devices/aids used by the patient prior to current illness, exacerbation or injury? Walker and [employer].  Uses a  walker outside the home and a cane inside the home.  Prior Functional Level Prior Function Level of Independence: Independent with assistive device(s) Comments: Pt states he is  doing own ADL's, using rollator outside and cane inside.  Self Care: Did the patient need help bathing, dressing, using the toilet or eating?  Independent  Indoor Mobility: Did the patient need assistance with walking from room to room (with or without device)? Independent  Stairs: Did the patient need assistance with internal or external stairs (with or without device)? Independent  Functional Cognition: Did the patient need help planning regular tasks such as shopping or remembering to take medications? Independent  Current Functional Level Cognition  Overall Cognitive Status: History of cognitive impairments - at baseline Difficult to assess due to: Impaired communication Orientation Level: Oriented to person, Oriented to place, Disoriented to time, Oriented to situation General Comments: Pt making several self-doubting comments. States he will never get better.    Extremity Assessment (includes Sensation/Coordination)  Upper Extremity Assessment: RUE deficits/detail, LUE deficits/detail RUE Deficits / Details: Decreased gross motor coordination and strength. Slight dysmetria with finger to nose and difficulty following command to raise R UE vs. L. Required physical inhibition to prevent utilizing L UE when asked to use R.  RUE Coordination: decreased fine motor, decreased gross motor LUE Deficits / Details: 4+/5 strength, residual from prior CVA.  Lower Extremity Assessment: Defer to PT evaluation, RLE deficits/detail, LLE deficits/detail RLE Deficits / Details: Pt with difficulty isolating R movement from L on commands. Able to move R LE in bed but during period of decreased responsiveness was unable to raise against gravity. Decreased coordination with stepping.  RLE Coordination: decreased fine motor,  decreased gross motor LLE Deficits / Details: Pt with difficulty isolating R LE movement from L. Unable to coordinate steps during transfer to chair. LLE Coordination: decreased gross motor    ADLs  Overall ADL's : Needs assistance/impaired Grooming: Moderate assistance, Oral care, Sitting, Cueing for sequencing Grooming Details (indicate cue type and reason): Decreased motor planning skills in the area of ideomotor and ideational praxis. Step-by-step cues and unable to sequence task despite verbal cues. Improved with hand over hand guidance for initiation. This task being completed as symptoms worsened prior to period of decreased responsivity. Upper Body Bathing: Moderate assistance, Cueing for sequencing, Sitting Lower Body Bathing: Moderate assistance, Cueing for sequencing Upper Body Dressing : Moderate assistance, Sitting Lower Body Dressing: Moderate assistance, Sit to/from stand Lower Body Dressing Details (indicate cue type and reason): Able to physically don/doff socks but decreased ability to plan movement and raising L LE rather than R despite attempts to raise R as well as verbal and tactile cues. Toilet Transfer: Moderate assistance, +2 for safety/equipment, Stand-pivot, RW, BSC Toilet Transfer Details (indicate cue type and reason): Pt required physical assistance to move R LE. DIfficulty coordinating and planning movements. Toileting- Clothing Manipulation and Hygiene: Maximal assistance, Sit to/from stand General ADL Comments: Pt initially with good participation and communication despite decreased coordination and motor planning. However, after sitting in chair for approximately 10 minutes, motor planning began to significantly decline and pt requiring hand over hand assistance to initiate tooth brushing tasks. Pt becoming frustrated and then less responsive. When asked how he was feeling, pt reported dizziness and therapist reclined pt. Pt closing eyes and becoming less arousable  and called RN to room. Approximately 30 seconds later on her arrival, pt returned to original conversational state and was better able to control movements. During this episode decreased strength in R LE and UE noted.    Mobility  Overal bed mobility: Needs Assistance Bed Mobility: Supine to Sit Rolling: Mod assist Sidelying to sit: Mod  assist Supine to sit: Min guard Sit to sidelying: Mod assist, +2 for physical assistance (Pt was resisted physical assist, unclear whether due to tone) General bed mobility comments: Increased time to transition to EOB. Pt was able to complete transfer without physical assist, however close guard was provided for safety.     Transfers  Overall transfer level: Needs assistance Equipment used: Rolling walker (2 wheeled) Transfer via Lift Equipment: Stedy Transfers: Sit to/from Stand Sit to Stand: Min assist, +2 safety/equipment Stand pivot transfers: Mod assist, +2 physical assistance General transfer comment: Used the Stedy to pull to stand. Pt completed x3 Sit<>Stand.     Ambulation / Gait / Stairs / Wheelchair Mobility  Ambulation/Gait General Gait Details: Pt was able to participate in pre-gait activity while standing on Stedy including weight shifting, repositioning feet on footplate of Stedy, and attempting to lift heels.     Posture / Balance Dynamic Sitting Balance Sitting balance - Comments: Improved from initial eval Balance Overall balance assessment: Needs assistance Sitting-balance support: Bilateral upper extremity supported Sitting balance-Leahy Scale: Fair Sitting balance - Comments: Improved from initial eval Postural control: Right lateral lean Standing balance support: Bilateral upper extremity supported Standing balance-Leahy Scale: Poor Standing balance comment: Reliant on RW    Special needs/care consideration BiPAP/CPAP No CPM No Continuous Drip IV No  Dialysis No      Life Vest No Oxygen No Special Bed No Trach Size  No Wound Vac (area) No      Skin Dry skin                              Bowel mgmt: Last BM 03/13/17 with incontinence Bladder mgmt: Voiding with incontinence Diabetic mgmt Yes, on oral medications at home    Previous Home Environment Living Arrangements: Alone (Reported alone to OT but spouse to PT) Available Help at Discharge: Family Type of Home: House Home Layout: One level Home Access: Level entry Bathroom Shower/Tub: Engineer, manufacturing systems: Standard Bathroom Accessibility: Yes How Accessible: Accessible via walker Home Care Services: No  Discharge Living Setting Plans for Discharge Living Setting: Alone, Apartment Type of Home at Discharge: Apartment (1st floor apartment.) Discharge Home Layout: One level Discharge Home Access: Level entry Does the patient have any problems obtaining your medications?: No  Social/Family/Support Systems Patient Roles: Parent, Other (Comment) (Has a daughter and daughter's boyfriend.) Contact Information: Nicholas [name] - daughter - [phone] Anticipated Caregiver: self and daughter intermittently Ability/Limitations of Caregiver: At this point, patient does not have anyone to provide supervision after discharge from rehab. Caregiver Availability: Intermittent (Patient aware of likely need for supervision at D/C) Discharge Plan Discussed with Primary Caregiver: Yes Is Caregiver In Agreement with Plan?: Yes Does Caregiver/Family have Issues with Lodging/Transportation while Pt is in Rehab?: No  Goals/Additional Needs Patient/Family Goal for Rehab: PT/OT supervision goals Expected length of stay: 10-14 days Cultural Considerations: None Dietary Needs: Carb mod, med cal, thin liquids Equipment Needs: TBD Pt/Family Agrees to Admission and willing to participate: Yes Program Orientation Provided & Reviewed with Pt/Caregiver Including Roles  & Responsibilities: Yes  Decrease burden of Care through IP rehab admission:  N/A  Possible need for SNF placement upon discharge: Yes, if patient does not progress to point where he can have intermittent supervision at home and/or patient does not find a caregiver for discharge home.  Patient Condition: This patient's condition remains as documented in the consult dated 03/12/17, in which the Rehabilitation  Physician determined and documented that the patient's condition is appropriate for intensive rehabilitative care in an inpatient rehabilitation facility. Will admit to inpatient rehab today.  Preadmission Screen Completed By:  Trish Mage, 03/13/2017 5:50 PM ______________________________________________________________________   Discussed status with Dr. Allena Katz on 03/13/17 at 1750 and received telephone approval for admission today.  Admission Coordinator:  Trish Mage, time 1750/Date 03/13/17

## 2017-03-13 NOTE — Progress Notes (Addendum)
Subjective: Continues to have difficulties voiding, required intermittent catheterization again yesterday.  Thinks that lying down and using the urinal has been making in more difficult to void than at home.  No headaches, continues to have weakness in his right leg and "zingers" down to his feet limited his ability to work with PT.  Objective:  Vital signs in last 24 hours: Vitals:   03/12/17 1700 03/12/17 2134 03/13/17 0151 03/13/17 0454  BP: (!) 147/47 (!) 164/56 (!) 143/44 (!) 129/51  Pulse: 68 65 65 68  Resp: Temp: 98.7 F (37.1 C) 98.2 F (36.8 C) 98 F (36.7 C) 99 F (37.2 C)  TempSrc: Oral Oral Oral Oral  SpO2: 98% 97% 98% 99%  Weight:      Height:       Physical Exam  Constitutional: He appears well-developed and well-nourished. No distress.  Neck:  Hypertrophic scar R lateral neck  Cardiovascular: Normal rate and regular rhythm.   3/6 systolic murmur loudest RUSB  Pulmonary/Chest: Effort normal and breath sounds normal.  Neurological:  Alert and oriented Face symmetric Strength 4+/5 left HF, KE, KF, 0/5 dorsi and platarflexion Srength 0/5 R HF, KE, KF, dorsi and plantarflexion No trace muscle activation throughout R leg No L leg extension with attempts at R flexion Reflexes 2+ symmetric at patella  Psychiatric:  Affect constricted   MRI/MRA Brain 03/11/2017 IMPRESSION: 1. No acute intracranial abnormality. 2. Stable bilateral frontal and right parietal chronic infarctions. 3. Moderate to severe paranasal sinus mucosal thickening. 4. 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 5. Patent circle of Willis. Otherwise no evidence for high-grade stenosis, large vessel occlusion, or aneurysm.  MRI L Spine 03/12/2017 IMPRESSION: 1. Congenitally narrow lumbar spinal canal with superimposed mild-to-moderate disc and facet degeneration. 2. Moderate spinal stenosis at  L3-4. 3. Moderate lateral recess stenosis at L4-5. 4. Moderate to severe neural foraminal stenosis on the left at L3-4 and bilaterally at L5-S1.  Assessment/Plan:  Principal Problem:   Lumbar radiculopathy Active Problems:   Hyperlipidemia   TOBACCO ABUSE   Essential hypertension   CAD, NATIVE VESSEL   Aortic valve disorder   Occlusion and stenosis of carotid artery   History of CVA (cerebrovascular accident)   66 y.o. male with history of prior CVA and CAP and risk factors of HTN, HL, DM, and tobacco abuse who presents with new right sided weakness concerning for stroke vs TIA.  MRI does not show any acute infarction, and his LE weakness may more likely represent compressive etiology.   #Right Leg Weakness #Lumbar Canal and Foraminal Stenosis MRI/MRA without acute infarct ruled out CVA.  He has chronic degenerative spine disease reaffirmed on MRI here, but not conspicuous compression.  His exam also seems to be changing rapidly from day to day with variable effort, suggesting a functional as well as organic component to his weakness. -Appreciate further neurology management recommendations -Continue home rosuvastatin, clopidogrel -dispo recommendations to CIR for inpatient rehab -Discussed MRI and case with neurosurgeon on call Dr Franky Macho; no anatomic explanation for new R leg weakness, chronic degenerative changes, no indication for decompressive surgery   #History of pSVT -Continue home digoxin -Resume home metoprolol and diltiazem -Telemetry  #Urinary Retention With his long history of BPH and successful small volume voiding with straining, this is due to his BPH and not neurogenic bladder. -PVRs -continue home doxazosin -I/O cath as needed -outpatient urology f/u  #  Aortic Stenosis Last echo 2015, mild-moderate. -echocardiogram  #HTN -Continue home metoprolol and diltiazem  #CAD -Continue home rosuvastatin, clopidogrel  #DM2 A1c 5.4% here, CBGs  90s-100s. -Discontinue glipizide 5 mg daily -SSI  Fluids: none Diet: carb DVT Prophylaxis: lovenox Code Status: DNR  Dispo: Anticipated discharge in approximately 1 day(s).   Alm Bustard, MD 03/13/2017, 8:39 AM Pager: [phone]

## 2017-03-13 NOTE — Discharge Instructions (Signed)
You were admitted to the hospital because of weakness in your leg.  We were worried that you might have had a stroke, but the MRI of your brain showed that you did NOT have a new stroke.  We also did MRIs of your spine since pinched nerves in the back can cause weakness, which showed that you have arthritis in the spine but nothing new that caused this weakness.  You need to follow-up with a urologist since you are having troubles urinating because of your large prostate.  You diabetes is very well controlled, and you should stop taking glipizide.  If you ever have new weakness on one side, facial droop, difficulties talking, or pass out, please call 911 and come back to the ED as these still could be signs of a stroke.  If you are unable to urinate, please call your PCP for an urgent appointment.  You will be discharged to the Inpatient Rehabilitation unit at [facility] to work on walking and getting stronger.

## 2017-03-13 NOTE — H&P (Signed)
Physical Medicine and Rehabilitation Admission H&P    Chief Complaint  Patient presents with  . Inability to walk with RLE weakness    HPI:   Nicholas [name] is a 66 y.o. male with history of CAD, T2DM with neuropathy, L-CVA with gait disorder and problems with initiation (CIR in 07/2014), concussion, CAD, back injury with left foot drop who was admitted on 03/11/17 with acute RLE weakness and urinary retention. History taken from chart review. MRI brain reviewed, showing chronic infarcts.  Per report, MRI/MRA brain done revealing no acute abnormality and stable bilateral frontal and right parietal chronic infarcts. Dr. Leonie Man was consulted for input and recommended imaging of lumbar spine due to concerns of lumbar spine disease v/s peroneal neuropathy as doubted stroke/TIA as cause of symptoms. MRI lumbar spine showed congenitally narrow lumbar spine with superimposed mild to moderate disc and facet degeneration, moderate spinal stenosis L3-4 and moderate to severe neural foraminal stenosis at left L3-4 and bilaterally at L5-SI.  2 D echo with EF 60-65% with severely calcified leaflets with moderate AR. Per reports --case discussed with Dr. Christella Noa and thoracic MRI recommended, reviewed, showing, .  PT/OT evaluation done revealing deficits in mobility and ability to carry out ADL as well as cognitive deficits affecting tasks. CIR recommended for follow up therapy.    Review of Systems  HENT: Negative for hearing loss and tinnitus.   Eyes: Negative for blurred vision and double vision.  Respiratory: Negative for cough and shortness of breath.   Cardiovascular: Negative for chest pain, palpitations and leg swelling.  Gastrointestinal: Negative for constipation, heartburn and nausea.  Genitourinary: Negative for dysuria and urgency.  Musculoskeletal: Positive for back pain (since fall 2-3 weeks ago) and falls (couple of falls in the past few weeks. ).  Neurological: Positive for focal weakness  (New right foot drop since fall).  Psychiatric/Behavioral: Negative for depression. The patient is not nervous/anxious and does not have insomnia.   All other systems reviewed and are negative.     Past Medical History:  Diagnosis Date  . At high risk for falls   . BPH (benign prostatic hypertrophy)    HX RETENTION  . Carotid artery occlusion    S/P RIGHT CEA  . Coronary artery disease CARDIOLOGIST--  DR Rayann Heman   S/P  PCI TO LAD 1993  . GERD (gastroesophageal reflux disease)   . History of CVA (cerebrovascular accident)    1993---  NO RESIDUALS  . History of head injury    CONCUSSION--  NO RESIDUAL  . History of myocardial infarction    1993--  NON-Q WAVE  S/P PCI TO LAD  . Hydrocele, left   . Hyperlipidemia   . Hypertension   . Left carotid artery stenosis    MILD  . Myocardial infarction   . PSVT (paroxysmal supraventricular tachycardia) ([place])   . PVD (peripheral vascular disease) ([place])    S/P LEFT FEM-POP  . Right leg weakness    USES CANE--  SECONDARY TO PVD  . Type 2 diabetes mellitus ([place])   . Wears glasses    Past Surgical History:  Procedure Laterality Date  . CARDIAC CATHETERIZATION  01-18-2003   DR Johnsie Cancel   NON-OBSTRUCTIVE CAD/  PREVIOIS ANGIOPLASTY SITE WIDELY PATENT  . CARDIOVASCULAR STRESS TEST  2007   SMALL ANTERO-APICAL SCAR/  NO ISCHEMIA  . CAROTID ENDARTERECTOMY Right 05-31-2003  . CORONARY ANGIOPLASTY  1993   PCI TO LAD  . FEMORAL-POPLITEAL BYPASS GRAFT Left 01-19-2003  . HYDROCELE  EXCISION Left 10/27/2013   Procedure: HYDROCELECTOMY ADULT;  Surgeon: Hanley Ben, MD;  Location: [facility];  Service: Urology;  Laterality: Left;  . RIGHT HYDROCELECTOMY  05-31-2003  . TRANSTHORACIC ECHOCARDIOGRAM  08-24-2010   EF 55%/  MILD AORTIC INSUFFICENCY/  MODERATE LVH    Family History  Problem Relation Age of Onset  . Stroke Mother   . Hypertension Mother   . Hypertension Father     Social History:  Lives alone and independent with  cane PTA.  He reports that he quit smoking about 23 years ago. His smoking use included Cigarettes.  He has never used smokeless tobacco. He reports that he does not drink alcohol or use drugs.    Allergies: No Known Allergies    Medications Prior to Admission  Medication Sig Dispense Refill  . clopidogrel (PLAVIX) 75 MG tablet Take 1 tablet (75 mg total) by mouth daily with breakfast. 90 tablet 3  . dicyclomine (BENTYL) 20 MG tablet Take 1 tablet (20 mg total) by mouth 2 (two) times daily. 20 tablet 0  . digoxin (LANOXIN) 0.25 MG tablet Take 0.25 mg by mouth daily.    Marland Kitchen diltiazem (TAZTIA XT) 360 MG 24 hr capsule Take 1 capsule (360 mg total) by mouth daily. 30 capsule 1  . docusate sodium (COLACE) 50 MG capsule Take 50 mg by mouth at bedtime.     Marland Kitchen doxazosin (CARDURA) 2 MG tablet Take 1 tablet (2 mg total) by mouth daily. 30 tablet 1  . glipiZIDE (GLUCOTROL XL) 5 MG 24 hr tablet Take 1 tablet (5 mg total) by mouth daily. 30 tablet 1  . metoprolol (LOPRESSOR) 50 MG tablet Take 1 tablet (50 mg total) by mouth 2 (two) times daily. 60 tablet 3  . rosuvastatin (CRESTOR) 10 MG tablet Take 1 tablet (10 mg total) by mouth daily. 30 tablet 1    Home: Home Living Family/patient expects to be discharged to:: Private residence Living Arrangements: Alone (Reported alone to OT but spouse to PT) Available Help at Discharge: Family Type of Home: House Home Access: Level entry Home Layout: One level Bathroom Shower/Tub: Chiropodist: Standard Bathroom Accessibility: Yes Home Equipment: Tub bench, Environmental consultant - 4 wheels, Cane - single point, Wheelchair - manual   Functional History: Prior Function Level of Independence: Independent with assistive device(s) Comments: Pt states he is doing own ADL's, using rollator outside and cane inside.  Functional Status:  Mobility: Bed Mobility Overal bed mobility: Needs Assistance Bed Mobility: Supine to Sit Rolling: Mod assist Sidelying  to sit: Mod assist Supine to sit: Min guard Sit to sidelying: Mod assist, +2 for physical assistance (Pt was resisted physical assist, unclear whether due to tone) General bed mobility comments: Increased time to transition to EOB. Pt was able to complete transfer without physical assist, however close guard was provided for safety.  Transfers Overall transfer level: Needs assistance Equipment used: Rolling walker (2 wheeled) Transfer via Lift Equipment: Stedy Transfers: Sit to/from Stand Sit to Stand: Min assist, +2 safety/equipment Stand pivot transfers: Mod assist, +2 physical assistance General transfer comment: Used the Stedy to pull to stand. Pt completed x3 Sit<>Stand.  Ambulation/Gait General Gait Details: Pt was able to participate in pre-gait activity while standing on Stedy including weight shifting, repositioning feet on footplate of Stedy, and attempting to lift heels.     ADL: ADL Overall ADL's : Needs assistance/impaired Grooming: Moderate assistance, Oral care, Sitting, Cueing for sequencing Grooming Details (indicate cue type and reason):  Decreased motor planning skills in the area of ideomotor and ideational praxis. Step-by-step cues and unable to sequence task despite verbal cues. Improved with hand over hand guidance for initiation. This task being completed as symptoms worsened prior to period of decreased responsivity. Upper Body Bathing: Moderate assistance, Cueing for sequencing, Sitting Lower Body Bathing: Moderate assistance, Cueing for sequencing Upper Body Dressing : Moderate assistance, Sitting Lower Body Dressing: Moderate assistance, Sit to/from stand Lower Body Dressing Details (indicate cue type and reason): Able to physically don/doff socks but decreased ability to plan movement and raising L LE rather than R despite attempts to raise R as well as verbal and tactile cues. Toilet Transfer: Moderate assistance, +2 for safety/equipment, Stand-pivot, RW,  BSC Toilet Transfer Details (indicate cue type and reason): Pt required physical assistance to move R LE. DIfficulty coordinating and planning movements. Toileting- Clothing Manipulation and Hygiene: Maximal assistance, Sit to/from stand General ADL Comments: Pt initially with good participation and communication despite decreased coordination and motor planning. However, after sitting in chair for approximately 10 minutes, motor planning began to significantly decline and pt requiring hand over hand assistance to initiate tooth brushing tasks. Pt becoming frustrated and then less responsive. When asked how he was feeling, pt reported dizziness and therapist reclined pt. Pt closing eyes and becoming less arousable and called RN to room. Approximately 30 seconds later on her arrival, pt returned to original conversational state and was better able to control movements. During this episode decreased strength in R LE and UE noted.  Cognition: Cognition Overall Cognitive Status: History of cognitive impairments - at baseline Orientation Level: Oriented to person, Oriented to place, Disoriented to time, Oriented to situation Cognition Arousal/Alertness: Awake/alert Behavior During Therapy: Flat affect Overall Cognitive Status: History of cognitive impairments - at baseline General Comments: Pt making several self-doubting comments. States he will never get better. Difficult to assess due to: Impaired communication  Physical Exam: Blood pressure (!) 145/52, pulse 73, temperature 98.8 F (37.1 C), temperature source Oral, resp. rate 16, height 5' 11"  (1.803 m), weight 118.5 kg (261 lb 4.8 oz), SpO2 99 %. Physical Exam  Nursing note and vitals reviewed. Constitutional: He is oriented to person, place, and time. He appears well-developed and well-nourished.  HENT:  Head: Normocephalic and atraumatic.  Mouth/Throat: Oropharynx is clear and moist.  Eyes: Conjunctivae and EOM are normal. Pupils are  equal, round, and reactive to light.  Neck: Normal range of motion. Neck supple.  Healed old incision right neck with keloid formation  Cardiovascular: Normal rate and regular rhythm.   Respiratory: Effort normal and breath sounds normal. No stridor. No respiratory distress. He has no wheezes.  GI: Soft. He exhibits no distension. There is no tenderness.  Musculoskeletal: He exhibits no edema or tenderness.  Neurological: He is alert and oriented to person, place, and time.  Flat affect.  Speech clear and able to follow simple commands without difficulty.   Motor: 5/5 in bilateral deltoid, bicep, tricep, grip RLE: HF, KE 4/5, 2/5 ADF/PF LLE: 4/5 HF, KE, 2/5 ADF/PF Delayed response to questions but answers appropriately  Skin: Skin is warm and dry. He is not diaphoretic.  Psychiatric: His affect is blunt. His speech is delayed. He is slowed.    Results for orders placed or performed during the hospital encounter of 03/11/17 (from the past 48 hour(s))  Glucose, capillary     Status: None   Collection Time: 03/11/17  5:09 PM  Result Value Ref Range   Glucose-Capillary 97  65 - 99 mg/dL  Glucose, capillary     Status: Abnormal   Collection Time: 03/11/17  9:10 PM  Result Value Ref Range   Glucose-Capillary 108 (H) 65 - 99 mg/dL   Comment 1 Notify RN    Comment 2 Document in Chart   Hemoglobin A1c     Status: None   Collection Time: 03/12/17  3:30 AM  Result Value Ref Range   Hgb A1c MFr Bld 5.4 4.8 - 5.6 %    Comment: (NOTE)         Pre-diabetes: 5.7 - 6.4         Diabetes: >6.4         Glycemic control for adults with diabetes: <7.0    Mean Plasma Glucose 108 mg/dL    Comment: (NOTE) Performed At: [facility] [place], [place] [id number] Lindon Romp MD [id number]   Lipid panel     Status: None   Collection Time: 03/12/17  3:30 AM  Result Value Ref Range   Cholesterol 111 0 - 200 mg/dL   Triglycerides 61 <150 mg/dL   HDL 42 >40 mg/dL    Total CHOL/HDL Ratio 2.6 RATIO   VLDL 12 0 - 40 mg/dL   LDL Cholesterol 57 0 - 99 mg/dL    Comment:        Total Cholesterol/HDL:CHD Risk Coronary Heart Disease Risk Table                     Men   Women  1/2 Average Risk   3.4   3.3  Average Risk       5.0   4.4  2 X Average Risk   9.6   7.1  3 X Average Risk  23.4   11.0        Use the calculated Patient Ratio above and the CHD Risk Table to determine the patient's CHD Risk.        ATP III CLASSIFICATION (LDL):  <100     mg/dL   Optimal  100-129  mg/dL   Near or Above                    Optimal  130-159  mg/dL   Borderline  160-189  mg/dL   High  >190     mg/dL   Very High   Basic metabolic panel     Status: None   Collection Time: 03/12/17  3:30 AM  Result Value Ref Range   Sodium 140 135 - 145 mmol/L   Potassium 3.6 3.5 - 5.1 mmol/L   Chloride 105 101 - 111 mmol/L   CO2 27 22 - 32 mmol/L   Glucose, Bld 76 65 - 99 mg/dL   BUN 9 6 - 20 mg/dL   Creatinine, Ser 1.13 0.61 - 1.24 mg/dL   Calcium 9.3 8.9 - 10.3 mg/dL   GFR calc non Af Amer >60 >60 mL/min   GFR calc Af Amer >60 >60 mL/min    Comment: (NOTE) The eGFR has been calculated using the CKD EPI equation. This calculation has not been validated in all clinical situations. eGFR's persistently <60 mL/min signify possible Chronic Kidney Disease.    Anion gap 8 5 - 15  Glucose, capillary     Status: None   Collection Time: 03/12/17 11:54 AM  Result Value Ref Range   Glucose-Capillary 97 65 - 99 mg/dL  Glucose, capillary     Status: Abnormal  Collection Time: 03/12/17  4:23 PM  Result Value Ref Range   Glucose-Capillary 116 (H) 65 - 99 mg/dL  Glucose, capillary     Status: Abnormal   Collection Time: 03/13/17  6:30 AM  Result Value Ref Range   Glucose-Capillary 108 (H) 65 - 99 mg/dL   Comment 1 Notify RN    Comment 2 Document in Chart   Glucose, capillary     Status: Abnormal   Collection Time: 03/13/17 11:29 AM  Result Value Ref Range   Glucose-Capillary  113 (H) 65 - 99 mg/dL   Nicholas [name]  Result Date: 03/12/2017 CLINICAL DATA:  66 y/o M; sudden onset of right-sided weakness in the lower extremity. EXAM: MRI HEAD WITHOUT [name] MRA HEAD WITHOUT [name] TECHNIQUE: Multiplanar, multiecho pulse sequences of the brain and surrounding structures were obtained without intravenous [name]. Angiographic images of the head were obtained using MRA technique without [name]. COMPARISON:  MRI and MRA of the head dated 04/26/2014. 03/10/2017 CT of the head. FINDINGS: MRI HEAD FINDINGS Brain: No abnormal diffusion restriction to suggest acute or early subacute infarction. Stable encephalomalacia in the superior bilateral frontal lobes and right parietal lobe compatible sequelae of chronic infarction. Background of mild chronic microvascular ischemic changes of white matter and brain parenchymal volume loss. There is mild hemosiderin staining of the chronic infarctions. No new susceptibility hypointensity is identified to suggest intracranial hemorrhage. Stable normal ventricle size. No extra-axial collection. No effacement of basilar cisterns. Vascular: As below. Skull and upper cervical spine: Normal marrow signal. Sinuses/Orbits: Moderate to severe diffuse paranasal sinus mucosal thickening. No abnormal signal of the mastoid air cells. Orbits are unremarkable. Other: None. 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 Patent left internal carotid artery. Anterior cerebral arteries:  Patent. Middle cerebral arteries: Patent. Anterior communicating artery: Patent. Posterior communicating arteries:  Patent. Posterior cerebral arteries:  Patent. Basilar artery:  Patent. Vertebral arteries:  Patent. No evidence of high-grade stenosis, large vessel occlusion, or aneurysm unless noted above. IMPRESSION: 1. No acute intracranial abnormality. 2. Stable bilateral  frontal and right parietal chronic infarctions. 3. Moderate to severe paranasal sinus mucosal thickening. 4. 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 5. Patent circle of Willis. Otherwise no evidence for high-grade stenosis, large vessel occlusion, or aneurysm. Electronically Signed   By: Kristine Garbe M.D.   On: 03/12/2017 00:53   Nicholas [name]  Result Date: 03/12/2017 CLINICAL DATA:  Right foot drop.  Urinary retention. EXAM: MRI LUMBAR SPINE WITHOUT [name] TECHNIQUE: Multiplanar, multisequence Nicholas imaging of the lumbar spine was performed. No intravenous [name] was administered. COMPARISON:  CT abdomen and pelvis 10/09/2015 FINDINGS: Segmentation:  Standard. Alignment:  Normal. Vertebrae: No evidence of fracture, suspicious osseous lesion, or significant marrow edema. Mild degenerative endplate marrow changes at L5-S1, predominantly type 2. Conus medullaris: Extends to the upper L2 level and appears normal. Fatty filum. Paraspinal and other soft tissues: Unremarkable. Disc levels: Disc desiccation from L2-3 to L5-S1 without significant disc space height loss. Diffuse narrowing of the lumbar spinal canal on a congenital basis due to short pedicles. T12-L1 and L1-2:  Negative. L2-3: Mild disc bulging and slight facet hypertrophy result in mild bilateral neural foraminal stenosis without significant spinal stenosis. L3-4: Circumferential disc bulging, congenitally short pedicles, and moderate facet and ligamentum flavum hypertrophy result in moderate spinal stenosis, left  greater than right lateral recess stenosis, and mild-to-moderate right and moderate to severe left neural foraminal stenosis. L4-5: Circumferential disc bulging, congenitally short pedicles, and moderate facet and ligamentum flavum hypertrophy result in mild spinal stenosis, moderate left greater than right lateral recess stenosis, and  mild-to-moderate bilateral neural foraminal stenosis. Bilateral paracentral annular fissures. L5-S1: Circumferential disc bulging, superimposed small central disc protrusion, and mild facet and ligamentum flavum hypertrophy result in mild bilateral lateral recess stenosis and moderate to severe bilateral neural foraminal stenosis without significant spinal stenosis. IMPRESSION: 1. Congenitally narrow lumbar spinal canal with superimposed mild-to-moderate disc and facet degeneration. 2. Moderate spinal stenosis at L3-4. 3. Moderate lateral recess stenosis at L4-5. 4. Moderate to severe neural foraminal stenosis on the left at L3-4 and bilaterally at L5-S1. Electronically Signed   By: Logan Bores M.D.   On: 03/12/2017 11:42   Nicholas [name]  Result Date: 03/12/2017 CLINICAL DATA:  66 y/o M; sudden onset of right-sided weakness in the lower extremity. EXAM: MRI HEAD WITHOUT [name] MRA HEAD WITHOUT [name] TECHNIQUE: Multiplanar, multiecho pulse sequences of the brain and surrounding structures were obtained without intravenous [name]. Angiographic images of the head were obtained using MRA technique without [name]. COMPARISON:  MRI and MRA of the head dated 04/26/2014. 03/10/2017 CT of the head. FINDINGS: MRI HEAD FINDINGS Brain: No abnormal diffusion restriction to suggest acute or early subacute infarction. Stable encephalomalacia in the superior bilateral frontal lobes and right parietal lobe compatible sequelae of chronic infarction. Background of mild chronic microvascular ischemic changes of white matter and brain parenchymal volume loss. There is mild hemosiderin staining of the chronic infarctions. No new susceptibility hypointensity is identified to suggest intracranial hemorrhage. Stable normal ventricle size. No extra-axial collection. No effacement of basilar cisterns. Vascular: As below. Skull and upper cervical spine: Normal marrow signal. Sinuses/Orbits: Moderate to severe diffuse paranasal  sinus mucosal thickening. No abnormal signal of the mastoid air cells. Orbits are unremarkable. Other: None. 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 Patent left internal carotid artery. Anterior cerebral arteries:  Patent. Middle cerebral arteries: Patent. Anterior communicating artery: Patent. Posterior communicating arteries:  Patent. Posterior cerebral arteries:  Patent. Basilar artery:  Patent. Vertebral arteries:  Patent. No evidence of high-grade stenosis, large vessel occlusion, or aneurysm unless noted above. IMPRESSION: 1. No acute intracranial abnormality. 2. Stable bilateral frontal and right parietal chronic infarctions. 3. Moderate to severe paranasal sinus mucosal thickening. 4. 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 5. Patent circle of Willis. Otherwise no evidence for high-grade stenosis, large vessel occlusion, or aneurysm. Electronically Signed   By: Kristine Garbe M.D.   On: 03/12/2017 00:53       Medical Problem List and Plan: 1.  Abnormality of gait, limitation in self-care secondary to acute right foot drop as well as right quadricep weakness, history of lumbar spinal stenosis with recent fall, superimposed on prior left foot drop. 2.  DVT Prophylaxis/Anticoagulation: Pharmaceutical: Lovenox 3. Pain Management: tylenol prn 4. Mood: LCSW to follow for evaluation and support.  5. Neuropsych: This patient is not fully capable of making decisions on his own behalf. 6. Skin/Wound Care: routine pressure relief measures.  7. Fluids/Electrolytes/Nutrition: Monitor I/O. Check lytes in am. 8. HTN: Monitor BP bid. Continue Cardura, Cardizem and lanoxin  9. H/o CVA: On Plavix and Crestor.  10 Urinary retention: Monitor voiding  with PVR checks. Check UA/UCS.     Post Admission  Physician Evaluation: 1. Preadmission assessment reviewed and changes made below. 2. Functional deficits secondary  to spinal stenosis.. 3. Patient is admitted to receive collaborative, interdisciplinary care between the physiatrist, rehab nursing staff, and therapy team. 4. Patient's level of medical complexity and substantial therapy needs in context of that medical necessity cannot be provided at a lesser intensity of care such as a SNF. 5. Patient has experienced substantial functional loss from his/her baseline which was documented above under the "Functional History" and "Functional Status" headings.  Judging by the patient's diagnosis, physical exam, and functional history, the patient has potential for functional progress which will result in measurable gains while on inpatient rehab.  These gains will be of substantial and practical use upon discharge  in facilitating mobility and self-care at the household level. 6. Physiatrist will provide 24 hour management of medical needs as well as oversight of the therapy plan/treatment and provide guidance as appropriate regarding the interaction of the two. 7. The Preadmission Screening has been reviewed and patient status is unchanged unless otherwise stated above. 8. 24 hour rehab nursing will assist with bladder management, safety, skin/wound care, disease management, medication administration and patient education  and help integrate therapy concepts, techniques,education, etc. 9. PT will assess and treat for/with: Lower extremity strength, range of motion, stamina, balance, functional mobility, safety, adaptive techniques and equipment, woundcare, coping skills, pain control, education.   Goals are: Supervision. 10. OT will assess and treat for/with: ADL's, functional mobility, safety, upper extremity strength, adaptive techniques and equipment, wound mgt, ego support, and community reintegration .   Goals are: Supervision Therapy may not proceed  with showering this patient. 11. Case Management and Social Worker will assess and treat for psychological issues and discharge planning. 12. Team conference will be held weekly to assess progress toward goals and to determine barriers to discharge. 13. Patient will receive at least 3 hours of therapy per day at least 5 days per week. 14. ELOS: 18-22 days.       15. Prognosis:  good  Delice Lesch, MD, [address] [place] 03/13/2017

## 2017-03-13 NOTE — Progress Notes (Signed)
STROKE TEAM PROGRESS NOTE   SUBJECTIVE (INTERVAL HISTORY) His daughter is at the bedside. MRI Lumbar spine confirms severe spinal stenosis at L 3-4 amd l 5-S1 with biforaminal stenosis. Neurosurgery has been consulted  OBJECTIVE Temp:  [97.8 F (36.6 C)-99 F (37.2 C)] 98.8 F (37.1 C) (04/04 0900) Pulse Rate:  [65-73] 73 (04/04 0900) Cardiac Rhythm: Normal sinus rhythm (04/04 0700) Resp:  [16-20] 16 (04/04 0900) BP: (129-164)/(44-56) 145/52 (04/04 0900) SpO2:  [94 %-99 %] 99 % (04/04 0900)  CBC:   Recent Labs Lab 03/10/17 2312  WBC 9.7  NEUTROABS 6.6  HGB 13.9  HCT 40.2  MCV 97.8  PLT 363    Basic Metabolic Panel:   Recent Labs Lab 03/10/17 2355 03/12/17 0330  NA 141 140  K 3.7 3.6  CL 103 105  CO2 30 27  GLUCOSE 99 76  BUN 11 9  CREATININE 1.17 1.13  CALCIUM 9.4 9.3   HgbA1c:  Lab Results  Component Value Date   HGBA1C 5.4 03/12/2017   PHYSICAL EXAM Frail middle aged african [place] male not in distress.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 Afebrile. Head is nontraumatic. Neck is supple without bruit.    Cardiac exam no murmur or gallop. Lungs are clear to auscultation. Distal pulses are well felt. Neurological Exam :  Awake alert oriented 3. No dysarthria. Diminished attention, registration and recall. Follows commands well. Extraocular movements are full range and nystagmus. Fundi were not visualized. Blinks to threat bilaterally. Face is symmetric without weakness. Hearing appears normal. Tongue is midline. Motor system exam reveals no upper or lower extremity drift but does have weakness of left grip and intrinsic hand muscles as well as left ankle dorsiflexors. Right upper extremity strength seems normal. He has right foot drop with grade 1-2/5 strength of ankle dorsiflexors. He has good strength and the hip and knees on the right side. Patient has positive Tinel sign over the right peroneal groove. Deep tendon reflexes are 2+ on the left 1+ on the  right. Plantars are both downgoing. Sensation appears intact bilaterally. Gait was not tested. ASSESSMENT/PLAN [name] is a 66 y.o. male with history of HTN, DB, HLD, stroke, CAD, SVT and spinal disease presenting with inability to stand/walk d/t R leg weakness. He did not receive IV t-PA.   Lumbar spine disease  Versus peroneal neuropathy likely with resultant foot drop, doubt new Stroke/TIA as patient's history is variable and MRI scan is negative for stroke  Resultant  R foot drop with preserved proximal leg strength  CT head no acute infarct. Old R post parietal and L frontal lobe infarcts  MRI  No acute stroke. Old B frontal and R parietal infarcts. Sinus disease.  MRA  Chronic R ICA occlusion.  Canceled Carotid Doppler  &  2D Echo    R knee xray OA   MRI lumbar spine pending   CT lumbar spine 03/2000 mod severe spinal stenosis L 3-4, L 4-5 w/ bulging discs and sm canal  MRI lumbar spine 01/2000 multifactorial central canal stenosis and mod lateral recess stenosis (mild L3-4, mod L 4-5) mod stenosis medial mid aspects L5-S1 L>R  LDL 57  HgbA1c pending  Lovenox 40 mg sq daily for VTE prophylaxis Diet Carb Modified Fluid consistency: Thin; Room service appropriate? 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 Await neurosurgical opinion. Transfer to inpatient rehabilitation if no surgical treatment is needed at this time. Stroke team will sign off. I had a long discussion with the patient's daughter and answered questions. I had a long discussion the patient and his daughter about plan for evaluation and answered questions. Greater than 50% time during this 25 minute visit was spent on discussion of differential diagnosis of right leg weakness, results of MRI scan of the lumbar spine  and answering questions Delia Heady, MD Medical Director [facility] Pager: [phone] 03/13/2017 1:11 PM  To contact Stroke Continuity provider, please refer to [email or web address]. After hours, contact General Neurology

## 2017-03-13 NOTE — Progress Notes (Addendum)
Patient is discharged from room [phone] and transferred to unit 4W07 at this time. Alert and in stable condition. IV site d/c'd as well as tele. Report given to receiving nurse Kennyth Arnold, RN with all questions answered. Left unit via bed with all belongings at side.

## 2017-03-13 NOTE — Progress Notes (Addendum)
SLP Cancellation Note  Patient Details Name: Nicholas [name] MRN: [id number] DOB: Jan 19, 1951   Cancelled treatment:       Reason Eval/Treat Not Completed: Patient at procedure or test/unavailable   Maxcine Ham 03/13/2017, 1:15 PM  Maxcine Ham, M.A. CCC-SLP [phone]

## 2017-03-13 NOTE — Progress Notes (Signed)
Erick Colace, MD Physician Addendum Physical Medicine and Rehabilitation  Consult Note Date of Service: 03/12/2017 3:59 PM  Related encounter: ED to Hosp-Admission (Current) from 03/11/2017 in [facility] 49[facility] NEURO MEDICAL     Expand All Collapse All   [] Hide copied text [] Hover for attribution information      Physical Medicine and Rehabilitation Consult  Reason for Consult: Right foot drop with difficulty walking in patient with prior stroke with left hemiparesis.  Referring Physician: Dr. Lawerance Bach   HPI: Nicholas STEPANEK is a 66 y.o. male with history of CAD, T2DM with neuropathy, L-CVA with gait disorder and problems with initiation (CIR in 07/2014), concussion, CAD, back injury with left foot drop who was admitted on 03/11/17 with acute RLE weakness and urinary retention.  MRI/MRA brain done revealing no acute abnormality and stable bilateral frontal and right parietal chronic infarcts. Dr. Pearlean Brownie was consulted for input and recommended lumbar spine due to concerns of lumbar spine disease v/s peroneal neuropathy as doubted stroke/TIA as cause of symptoms. MRI lumbar spine showed congenitally narrow lumbar spine with superimposed mild to moderate disc and facet degeneration, moderate spinal stenosis L3-4 and moderate to severe neural foraminal stenosis at left L3-4 and bilaterally at L5-SI. PT/OT evaluation done revealing deficits in mobility and ability to carry out ADL as well as cognitive deficits affecting tasks. CIR recommended for follow up therapy.   According to patient and daughter, patient was ambulating with a cane in the home and currently is nonambulatory.  Review of Systems  Constitutional: Negative.  Negative for chills and fever.  HENT: Negative.  Negative for hearing loss and nosebleeds.   Eyes: Negative.   Respiratory: Negative for cough, hemoptysis, sputum production and stridor.   Cardiovascular: Negative for chest pain, palpitations and  orthopnea.  Gastrointestinal: Negative for heartburn and nausea.       +incont  Skin: Negative.  Negative for itching and rash.  Neurological: Positive for focal weakness. Negative for dizziness, tingling and loss of consciousness.  Endo/Heme/Allergies: Negative for environmental allergies and polydipsia.  Psychiatric/Behavioral: The patient has insomnia. The patient is not nervous/anxious.           Past Medical History:  Diagnosis Date  . At high risk for falls   . BPH (benign prostatic hypertrophy)    HX RETENTION  . Carotid artery occlusion    S/P RIGHT CEA  . Coronary artery disease CARDIOLOGIST--  DR Johney Frame   S/P  PCI TO LAD 1993  . GERD (gastroesophageal reflux disease)   . History of CVA (cerebrovascular accident)    1993---  NO RESIDUALS  . History of head injury    CONCUSSION--  NO RESIDUAL  . History of myocardial infarction    1993--  NON-Q WAVE  S/P PCI TO LAD  . Hydrocele, left   . Hyperlipidemia   . Hypertension   . Left carotid artery stenosis    MILD  . Myocardial infarction   . PSVT (paroxysmal supraventricular tachycardia) (HCC)   . PVD (peripheral vascular disease) (HCC)    S/P LEFT FEM-POP  . Right leg weakness    USES CANE--  SECONDARY TO PVD  . Type 2 diabetes mellitus (HCC)   . Wears glasses          Past Surgical History:  Procedure Laterality Date  . CARDIAC CATHETERIZATION  01-18-2003   DR Eden Emms   NON-OBSTRUCTIVE CAD/  PREVIOIS ANGIOPLASTY SITE WIDELY PATENT  . CARDIOVASCULAR STRESS TEST  2007  SMALL ANTERO-APICAL SCAR/  NO ISCHEMIA  . CAROTID ENDARTERECTOMY Right 05-31-2003  . CORONARY ANGIOPLASTY  1993   PCI TO LAD  . FEMORAL-POPLITEAL BYPASS GRAFT Left 01-19-2003  . HYDROCELE EXCISION Left 10/27/2013   Procedure: HYDROCELECTOMY ADULT;  Surgeon: Lindaann Slough, MD;  Location: [facility];  Service: Urology;  Laterality: Left;  . RIGHT HYDROCELECTOMY  05-31-2003  . TRANSTHORACIC  ECHOCARDIOGRAM  08-24-2010   EF 55%/  MILD AORTIC INSUFFICENCY/  MODERATE LVH         Family History  Problem Relation Age of Onset  . Stroke Mother   . Hypertension Mother   . Hypertension Father     Social History:  reports that he quit smoking about 23 years ago. His smoking use included Cigarettes. He quit after 0.00 years of use. He has never used smokeless tobacco. He reports that he does not drink alcohol or use drugs.    Allergies: No Known Allergies          Medications Prior to Admission  Medication Sig Dispense Refill  . clopidogrel (PLAVIX) 75 MG tablet Take 1 tablet (75 mg total) by mouth daily with breakfast. 90 tablet 3  . dicyclomine (BENTYL) 20 MG tablet Take 1 tablet (20 mg total) by mouth 2 (two) times daily. 20 tablet 0  . digoxin (LANOXIN) 0.25 MG tablet Take 0.25 mg by mouth daily.    Marland Kitchen diltiazem (TAZTIA XT) 360 MG 24 hr capsule Take 1 capsule (360 mg total) by mouth daily. 30 capsule 1  . docusate sodium (COLACE) 50 MG capsule Take 50 mg by mouth at bedtime.     Marland Kitchen doxazosin (CARDURA) 2 MG tablet Take 1 tablet (2 mg total) by mouth daily. 30 tablet 1  . glipiZIDE (GLUCOTROL XL) 5 MG 24 hr tablet Take 1 tablet (5 mg total) by mouth daily. 30 tablet 1  . metoprolol (LOPRESSOR) 50 MG tablet Take 1 tablet (50 mg total) by mouth 2 (two) times daily. 60 tablet 3  . rosuvastatin (CRESTOR) 10 MG tablet Take 1 tablet (10 mg total) by mouth daily. 30 tablet 1    Home: Home Living Family/patient expects to be discharged to:: Private residence Living Arrangements: Spouse/significant other Available Help at Discharge: Family Type of Home: House Home Access: Level entry Home Layout: One level Bathroom Shower/Tub: Tub/shower unit Home Equipment: Tub bench, Environmental consultant - 4 wheels, Cane - single point, Wheelchair - manual  Functional History: Prior Function Level of Independence: Independent with assistive device(s) Comments: Pt states he is doing own  ADL's, using rollator outside and cane inside. Functional Status:  Mobility: Bed Mobility Overal bed mobility: Needs Assistance Bed Mobility: Supine to Sit, Sit to Sidelying, Rolling Rolling: Mod assist Sidelying to sit: Mod assist Supine to sit: Modified independent (Device/Increase time) Sit to sidelying: Mod assist, +2 for physical assistance (Pt was resisted physical assist, unclear whether due to tone) General bed mobility comments: pt needed more assistance with return to supine than for supine to sit  ADL:  Cognition: Cognition Overall Cognitive Status: History of cognitive impairments - at baseline Orientation Level: Oriented to person, Oriented to place, Disoriented to time, Oriented to situation Cognition Arousal/Alertness: Awake/alert Behavior During Therapy: Anxious, Flat affect Overall Cognitive Status: History of cognitive impairments - at baseline General Comments: Pt's cognitive status declined after sitting at EOB for about 2 min.  This includes decreased response to commands and questions.  When instructed to put on socks, pt applied to L foot successfully, but unsuccessful  with R and tried to apply sock to L foot again x 8.  Wife stated she noticed similar decline in pt response with sitting and better responses in supine. Difficult to assess due to: Impaired communication   Blood pressure (!) 150/48, pulse 71, temperature 97.8 F (36.6 C), temperature source Oral, resp. rate 16, height  (1.803 m), weight 118.5 kg (261 lb 4.8 oz), SpO2 94 %. Physical Exam   General: No acute distress Mood and affect are appropriate Heart: Regular rate and rhythm no rubs murmurs or extra sounds Lungs: Clear to auscultation, breathing unlabored, no rales or wheezes Abdomen: Positive bowel sounds, soft nontender to palpation, nondistended Extremities: No clubbing, cyanosis, or edema Skin: No evidence of breakdown, no evidence of rash Neurologic: Cranial nerves II  through XII intact, motor strength is 5/5 in bilateral deltoid, bicep, tricep, grip, 2- R, 4 L hip flexor,3- R, 4 L knee extensors, 0 R, trace L ankle dorsiflexor and 0R, 0L plantar flexor, 0 EHL Bilat Delayed response to questions but answers appropriately Sensory exam normal sensation to light touch  in bilateral upper and absent left L4,5 S1 Left , intact Right  lower extremities Cerebellar exam normal finger to nose to finger Musculoskeletal:no jt swelling no pain with Knee hip or ankle ROM  Lab Results Last 24 Hours       Results for orders placed or performed during the hospital encounter of 03/11/17 (from the past 24 hour(s))  Glucose, capillary     Status: None   Collection Time: 03/11/17  5:09 PM  Result Value Ref Range   Glucose-Capillary 97 65 - 99 mg/dL  Glucose, capillary     Status: Abnormal   Collection Time: 03/11/17  9:10 PM  Result Value Ref Range   Glucose-Capillary 108 (H) 65 - 99 mg/dL   Comment 1 Notify RN    Comment 2 Document in Chart   Lipid panel     Status: None   Collection Time: 03/12/17  3:30 AM  Result Value Ref Range   Cholesterol 111 0 - 200 mg/dL   Triglycerides 61 <161 mg/dL   HDL 42 >09 mg/dL   Total CHOL/HDL Ratio 2.6 RATIO   VLDL 12 0 - 40 mg/dL   LDL Cholesterol 57 0 - 99 mg/dL  Basic metabolic panel     Status: None   Collection Time: 03/12/17  3:30 AM  Result Value Ref Range   Sodium 140 135 - 145 mmol/L   Potassium 3.6 3.5 - 5.1 mmol/L   Chloride 105 101 - 111 mmol/L   CO2 27 22 - 32 mmol/L   Glucose, Bld 76 65 - 99 mg/dL   BUN 9 6 - 20 mg/dL   Creatinine, Ser 6.04 0.61 - 1.24 mg/dL   Calcium 9.3 8.9 - 54.0 mg/dL   GFR calc non Af Amer >60 >60 mL/min   GFR calc Af Amer >60 >60 mL/min   Anion gap 8 5 - 15  Glucose, capillary     Status: None   Collection Time: 03/12/17 11:54 AM  Result Value Ref Range   Glucose-Capillary 97 65 - 99 mg/dL      Imaging Results (Last 48 hours)  Ct Head [name]  Contrast  Result Date: 03/10/2017 CLINICAL DATA:  Right-sided weakness.  Gait difficulty. EXAM: CT HEAD WITHOUT CONTRAST TECHNIQUE: Contiguous axial images were obtained from the base of the skull through the vertex without intravenous contrast. COMPARISON:  10/03/2014 FINDINGS: Brain: Chronic encephalomalacia within the right posterior parietal  lobe is identified, image 22 of series 201. Remote left ACA territory infarct is unchanged from previous exam. There is prominence of the sulci and ventricles identified compatible with brain atrophy. No abnormal extra-axial fluid collection, intracranial hemorrhage or mass identified. Vascular: No hyperdense vessel or unexpected calcification. Skull: Normal. Negative for fracture or focal lesion. Sinuses/Orbits: No acute finding. Other: None. IMPRESSION: 1. No acute intracranial abnormality. 2. 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 Electronically Signed   By: Signa Kell M.D.   On: 03/10/2017 22:41   Nicholas [name] Contrast  Result Date: 03/12/2017 CLINICAL DATA:  66 y/o M; sudden onset of right-sided weakness in the lower extremity. EXAM: MRI HEAD WITHOUT CONTRAST MRA HEAD WITHOUT CONTRAST TECHNIQUE: Multiplanar, multiecho pulse sequences of the brain and surrounding structures were obtained without intravenous contrast. Angiographic images of the head were obtained using MRA technique without contrast. COMPARISON:  MRI and MRA of the head dated 04/26/2014. 03/10/2017 CT of the head. FINDINGS: MRI HEAD FINDINGS Brain: No abnormal diffusion restriction to suggest acute or early subacute infarction. Stable encephalomalacia in the superior bilateral frontal lobes and right parietal lobe compatible sequelae of chronic infarction. Background of mild chronic microvascular ischemic changes of white matter and brain parenchymal volume loss. There is mild hemosiderin staining of the chronic infarctions. No new susceptibility hypointensity is  identified to suggest intracranial hemorrhage. Stable normal ventricle size. No extra-axial collection. No effacement of basilar cisterns. Vascular: As below. Skull and upper cervical spine: Normal marrow signal. Sinuses/Orbits: Moderate to severe diffuse paranasal sinus mucosal thickening. No abnormal signal of the mastoid air cells. Orbits are unremarkable. Other: None. 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 Patent left internal carotid artery. Anterior cerebral arteries:  Patent. Middle cerebral arteries: Patent. Anterior communicating artery: Patent. Posterior communicating arteries:  Patent. Posterior cerebral arteries:  Patent. Basilar artery:  Patent. Vertebral arteries:  Patent. No evidence of high-grade stenosis, large vessel occlusion, or aneurysm unless noted above. IMPRESSION: 1. No acute intracranial abnormality. 2. Stable bilateral frontal and right parietal chronic infarctions. 3. Moderate to severe paranasal sinus mucosal thickening. 4. 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 5. Patent circle of Willis. Otherwise no evidence for high-grade stenosis, large vessel occlusion, or aneurysm. Electronically Signed   By: Mitzi Hansen M.D.   On: 03/12/2017 00:53   Nicholas Lumbar Spine [name] Contrast  Result Date: 03/12/2017 CLINICAL DATA:  Right foot drop.  Urinary retention. EXAM: MRI LUMBAR SPINE WITHOUT CONTRAST TECHNIQUE: Multiplanar, multisequence Nicholas imaging of the lumbar spine was performed. No intravenous contrast was administered. COMPARISON:  CT abdomen and pelvis 10/09/2015 FINDINGS: Segmentation:  Standard. Alignment:  Normal. Vertebrae: No evidence of fracture, suspicious osseous lesion, or significant marrow edema. Mild degenerative endplate marrow changes at L5-S1, predominantly type 2. Conus  medullaris: Extends to the upper L2 level and appears normal. Fatty filum. Paraspinal and other soft tissues: Unremarkable. Disc levels: Disc desiccation from L2-3 to L5-S1 without significant disc space height loss. Diffuse narrowing of the lumbar spinal canal on a congenital basis due to short pedicles. T12-L1 and L1-2:  Negative. L2-3: Mild disc bulging and slight facet hypertrophy result in mild bilateral neural foraminal stenosis without significant spinal stenosis. L3-4: Circumferential disc bulging, congenitally short pedicles, and moderate facet and ligamentum flavum hypertrophy result in moderate spinal stenosis, left greater than right lateral  recess stenosis, and mild-to-moderate right and moderate to severe left neural foraminal stenosis. L4-5: Circumferential disc bulging, congenitally short pedicles, and moderate facet and ligamentum flavum hypertrophy result in mild spinal stenosis, moderate left greater than right lateral recess stenosis, and mild-to-moderate bilateral neural foraminal stenosis. Bilateral paracentral annular fissures. L5-S1: Circumferential disc bulging, superimposed small central disc protrusion, and mild facet and ligamentum flavum hypertrophy result in mild bilateral lateral recess stenosis and moderate to severe bilateral neural foraminal stenosis without significant spinal stenosis. IMPRESSION: 1. Congenitally narrow lumbar spinal canal with superimposed mild-to-moderate disc and facet degeneration. 2. Moderate spinal stenosis at L3-4. 3. Moderate lateral recess stenosis at L4-5. 4. Moderate to severe neural foraminal stenosis on the left at L3-4 and bilaterally at L5-S1. Electronically Signed   By: Sebastian Ache M.D.   On: 03/12/2017 11:42   Dg Knee Complete 4 Views Right  Result Date: 03/10/2017 CLINICAL DATA:  Fall. EXAM: RIGHT KNEE - COMPLETE 4+ VIEW COMPARISON:  None. FINDINGS: There is sharpening of the tibial spines. Mild to moderate tricompartment osteoarthritis  noted. No acute fractures or subluxations identified. IMPRESSION: 1. Osteoarthritis. 2. No acute findings. Electronically Signed   By: Signa Kell M.D.   On: 03/10/2017 23:05   Nicholas [name]  Result Date: 03/12/2017 CLINICAL DATA:  66 y/o M; sudden onset of right-sided weakness in the lower extremity. EXAM: MRI HEAD WITHOUT CONTRAST MRA HEAD WITHOUT CONTRAST TECHNIQUE: Multiplanar, multiecho pulse sequences of the brain and surrounding structures were obtained without intravenous contrast. Angiographic images of the head were obtained using MRA technique without contrast. COMPARISON:  MRI and MRA of the head dated 04/26/2014. 03/10/2017 CT of the head. FINDINGS: MRI HEAD FINDINGS Brain: No abnormal diffusion restriction to suggest acute or early subacute infarction. Stable encephalomalacia in the superior bilateral frontal lobes and right parietal lobe compatible sequelae of chronic infarction. Background of mild chronic microvascular ischemic changes of white matter and brain parenchymal volume loss. There is mild hemosiderin staining of the chronic infarctions. No new susceptibility hypointensity is identified to suggest intracranial hemorrhage. Stable normal ventricle size. No extra-axial collection. No effacement of basilar cisterns. Vascular: As below. Skull and upper cervical spine: Normal marrow signal. Sinuses/Orbits: Moderate to severe diffuse paranasal sinus mucosal thickening. No abnormal signal of the mastoid air cells. Orbits are unremarkable. Other: None. 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 Patent left internal carotid artery. Anterior cerebral arteries:  Patent. Middle cerebral arteries: Patent. Anterior communicating artery: Patent. Posterior communicating arteries:  Patent. Posterior cerebral arteries:  Patent. Basilar artery:  Patent. Vertebral arteries:  Patent. No evidence  of high-grade stenosis, large vessel occlusion, or aneurysm unless noted above. IMPRESSION: 1. No acute intracranial abnormality. 2. Stable bilateral frontal and right parietal chronic infarctions. 3. Moderate to severe paranasal sinus mucosal thickening. 4. 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 5. Patent circle of Willis. Otherwise no evidence for high-grade stenosis, large vessel occlusion, or aneurysm. Electronically Signed   By: Mitzi Hansen M.D.   On: 03/12/2017 00:53     Assessment/Plan: Diagnosis: Acute right foot drop as well as right quadricep weakness, history of lumbar spinal stenosis with recent fall, superimposed on prior left foot drop from spinal stenosis. 1. Does the need for close, 24 hr/day medical supervision in concert with the patient's rehab needs make it unreasonable for this patient to be  served in a less intensive setting? Yes 2. Co-Morbidities requiring supervision/potential complications: Hypertension, history of coronary artery disease, history of cognitive deficits related to prior CVA, bowel and bladder incontinence may be neurogenic 3. Due to bladder management, bowel management, safety, skin/wound care, disease management, medication administration, pain management and patient education, does the patient require 24 hr/day rehab nursing? Yes 4. Does the patient require coordinated care of a physician, rehab nurse, PT (1-2 hrs/day, 5 days/week) and OT (1-2 hrs/day, 5 days/week) to address physical and functional deficits in the context of the above medical diagnosis(es)? Yes Addressing deficits in the following areas: balance, endurance, locomotion, strength, transferring, bowel/bladder control, bathing, dressing, feeding, grooming, toileting, cognition and psychosocial support 5. Can the patient actively participate in an intensive therapy program of at least 3 hrs of therapy  per day at least 5 days per week? Yes 6. The potential for patient to make measurable gains while on inpatient rehab is good 7. Anticipated functional outcomes upon discharge from inpatient rehab are supervision  with PT, supervision with OT, n/a with SLP. 8. Estimated rehab length of stay to reach the above functional goals is: 10-14 days 9. Does the patient have adequate social supports and living environment to accommodate these discharge functional goals? Yes 10. Anticipated D/C setting: Home 11. Anticipated post D/C treatments: HH therapy 12. Overall Rehab/Functional Prognosis: good  RECOMMENDATIONS: This patient's condition is appropriate for continued rehabilitative care in the following setting: CIR Patient has agreed to participate in recommended program. Yes Note that insurance prior authorization may be required for reimbursement for recommended care.  Comment: Discussed with primary service, no surgical intervention was recommended  Erick Colace M.D. [facility] FAAPM&R (Sports Med, Neuromuscular Med) Diplomate [place] Board of Electrodiagnostic Med  Jerene Pitch 03/12/2017          Revision History                             Routing History

## 2017-03-13 NOTE — Progress Notes (Signed)
Internal Medicine Attending:   I saw and examined the patient. I reviewed the resident's note and I agree with the resident's findings and plan as documented in the resident's note.  66 year old man with a prior CVA was admitted with acute right leg weakness and worsened functional status and now hospital day #3. MRI brain is ruled out an acute CVA. Right leg weakness is worse today than it was yesterday, on exam he is unable to lift the leg off the bed or holding a raised position. Sensation is intact. Reflexes are normal in the right side. MRI lumbar spine done yesterday shows a moderate degree of spinal stenosis, facet and disc disease through the lumbar spine. No obvious compressive lesion. Given profound right-sided weakness plan is to consult neurosurgery to see if there is a role for decompressive surgery. If not, plan is for CIR to improve his worsened deficits as much as able. Will also discuss with neurology. Continue plavix and atorva for secondary CVA prevention. Urinary retention is still a problem, likely bladder outlet obstruction due to BPH. I doubt its neurogenic given there is no sensory deficits and this has been going on for months-years. Plan to continue post void residual monitoring, in and out catheter for residuals greater than 300 mLs. He is on the borderline for needing an indwelling Foley catheter, but were going to hold for now.

## 2017-03-13 NOTE — Progress Notes (Signed)
Trish Mage, RN Rehab Admission Coordinator Signed Physical Medicine and Rehabilitation  PMR Pre-admission Date of Service: 03/13/2017 5:42 PM  Related encounter: ED to Hosp-Admission (Current) from 03/11/2017 in [facility] 28M NEURO MEDICAL       Hide copied text PMR Admission Coordinator Pre-Admission Assessment  Patient: Nicholas [name] is an 66 y.o., male MRN: [id number] DOB: October 13, 1951 Height:  (180.3 cm) Weight: 118.5 kg (261 lb 4.8 oz)                                                                                                                                                  Insurance Information HMO: No   PPO:       PCP:       IPA:       80/20:       OTHER:   PRIMARY:  Medicare A/B      Policy#:  [id number]      Subscriber:  patient CM Name:        Phone#:       Fax#:   Pre-Cert#:        Employer:  Disabled Benefits:  Phone #:       Name: Checked in [place]. Date: 06/09/02     Deduct:  $1340      Out of Pocket Max:  none      Life Max: unlimited CIR: 100%      SNF: 100 days Outpatient: 80%     Co-Pay: 20% Home Health: 100%      Co-Pay: none DME: 80%     Co-Pay: 20% Providers: patient's choice  SECONDARY:       Policy#:       Subscriber:  CM Name:       Phone#:      Fax#:  Pre-Cert#:       Employer:  Benefits:  Phone #:      Name:  Eff. 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 MRI/MRA brain done revealing no acute abnormality and stable bilateral frontal and right parietal chronic infarcts. Dr. Pearlean Brownie was consulted for input and recommended lumbar spine due to concerns of lumbar spine disease v/s peroneal neuropathy as doubted stroke/TIA as cause of symptoms. MRI lumbar spine showed congenitally narrow lumbar spine with superimposed mild to moderate disc and facet degeneration, moderate spinal stenosis L3-4 and moderate to severe neural foraminal stenosis at left L3-4 and bilaterally at L5-SI. 2 D echo with EF 60-65% with severely calcified leaflets with moderate AR. Per reports --case discussed with Dr. Franky Macho and thoracic MRI recommended. PT/OT evaluation done revealing deficits in mobility and ability to carry out ADL as well as cognitive deficits affecting tasks. CIR recommended for follow up therapy.     Past Medical History      Past Medical History:  Diagnosis Date  . At high risk for falls   . BPH (benign prostatic hypertrophy)    HX RETENTION  . Carotid artery occlusion    S/P RIGHT CEA  . Coronary artery disease CARDIOLOGIST--  DR Johney Frame   S/P  PCI TO LAD 1993  . GERD (gastroesophageal reflux disease)   . History of CVA (cerebrovascular accident)    1993---  NO RESIDUALS  . History of head injury    CONCUSSION--  NO RESIDUAL  . History of myocardial infarction    1993--  NON-Q WAVE  S/P PCI TO LAD  . Hydrocele, left   . Hyperlipidemia   . Hypertension   . Left carotid artery stenosis    MILD  . Myocardial infarction   . PSVT (paroxysmal supraventricular tachycardia) (HCC)   . PVD (peripheral vascular disease) (HCC)    S/P LEFT FEM-POP  . Right leg weakness    USES CANE--  SECONDARY TO PVD  . Type 2 diabetes mellitus (HCC)   . Wears glasses      Family History  family history includes Hypertension in his father and mother; Stroke in his mother.  Prior Rehab/Hospitalizations: No previous rehab.  Has the patient had major surgery during 100 days prior to admission? No  Current Medications   Current Facility-Administered Medications:  .  acetaminophen (TYLENOL) tablet 650 mg, 650 mg, Oral, Q6H PRN **OR** acetaminophen (TYLENOL) suppository 650 mg, 650 mg, Rectal, Q6H PRN, Alexa R Burns, MD .  clopidogrel (PLAVIX) tablet 75 mg, 75 mg, Oral, Q breakfast, Alexa R Burns, MD, 75 mg at 03/13/17 0851 .  digoxin (LANOXIN) tablet 0.25 mg, 0.25 mg, Oral, Daily, Alexa R Burns, MD, 0.25 mg at 03/13/17 0900 .  diltiazem (CARDIZEM CD) 24 hr capsule 360 mg, 360 mg, Oral, Daily, Thomasene Lot, MD, 360 mg at 03/13/17 0900 .  doxazosin (CARDURA) tablet 2 mg, 2 mg, Oral, Daily, Alexa R Burns, MD, 2 mg at 03/13/17 0900 .  enoxaparin (LOVENOX) injection 40 mg, 40 mg, Subcutaneous, Q24H, Alexa R Burns, MD, 40 mg at 03/13/17 1524 .  insulin aspart (novoLOG) injection 0-9 Units, 0-9 Units, Subcutaneous, TID WC, Alexa R Lawerance Bach, MD, 1 Units at 03/13/17 1743 .  LORazepam (ATIVAN) injection 1 mg, 1 mg, Intravenous, Q5 Min x 2 PRN, Alm Bustard, MD .  metoprolol (LOPRESSOR) tablet 50 mg, 50 mg, Oral, BID, Thomasene Lot, MD, 50 mg at 03/13/17 0900 .  rosuvastatin (CRESTOR) tablet 10 mg, 10 mg, Oral, Daily, Alexa R Burns, MD, 10 mg at 03/13/17 0900 .  sodium chloride flush (NS) 0.9 %  injection 3 mL, 3 mL, Intravenous, Q12H, Alexa R Burns, MD, 3 mL at 03/13/17 0901  Patients Current Diet: Diet Carb Modified Fluid consistency: Thin; Room service appropriate? Yes Diet - low sodium heart healthy  Precautions / Restrictions Precautions Precautions: Fall Restrictions Weight Bearing Restrictions: No   Has the patient had 2 or more falls or a fall with injury in the past year?Yes. Patient reports 2 falls at his home recently with injury to his  bottom.  Prior Activity Level Community (5-7x/wk): Walked to [employer] daily with his walker.  Went to the store and bank 1-2 X a week, not driving.    Home Assistive Devices / Equipment Home Assistive Devices/Equipment: Dan Humphreys (specify type) Home Equipment: Tub bench, Walker - 4 wheels, Cane - single point, Wheelchair - manual  Prior Device Use: Indicate devices/aids used by the patient prior to current illness, exacerbation or injury? Walker and [employer].  Uses a walker outside the home and a cane inside the home.  Prior Functional Level Prior Function Level of Independence: Independent with assistive device(s) Comments: Pt states he is doing own ADL's, using rollator outside and cane inside.  Self Care: Did the patient need help bathing, dressing, using the toilet or eating?  Independent  Indoor Mobility: Did the patient need assistance with walking from room to room (with or without device)? Independent  Stairs: Did the patient need assistance with internal or external stairs (with or without device)? Independent  Functional Cognition: Did the patient need help planning regular tasks such as shopping or remembering to take medications? Independent  Current Functional Level Cognition  Overall Cognitive Status: History of cognitive impairments - at baseline Difficult to assess due to: Impaired communication Orientation Level: Oriented to person, Oriented to place, Disoriented to time, Oriented to situation General Comments: Pt making several self-doubting comments. States he will never get better.    Extremity Assessment (includes Sensation/Coordination)  Upper Extremity Assessment: RUE deficits/detail, LUE deficits/detail RUE Deficits / Details: Decreased gross motor coordination and strength. Slight dysmetria with finger to nose and difficulty following command to raise R UE vs. L. Required physical inhibition to prevent utilizing L UE when asked to use R.  RUE  Coordination: decreased fine motor, decreased gross motor LUE Deficits / Details: 4+/5 strength, residual from prior CVA.  Lower Extremity Assessment: Defer to PT evaluation, RLE deficits/detail, LLE deficits/detail RLE Deficits / Details: Pt with difficulty isolating R movement from L on commands. Able to move R LE in bed but during period of decreased responsiveness was unable to raise against gravity. Decreased coordination with stepping.  RLE Coordination: decreased fine motor, decreased gross motor LLE Deficits / Details: Pt with difficulty isolating R LE movement from L. Unable to coordinate steps during transfer to chair. LLE Coordination: decreased gross motor    ADLs  Overall ADL's : Needs assistance/impaired Grooming: Moderate assistance, Oral care, Sitting, Cueing for sequencing Grooming Details (indicate cue type and reason): Decreased motor planning skills in the area of ideomotor and ideational praxis. Step-by-step cues and unable to sequence task despite verbal cues. Improved with hand over hand guidance for initiation. This task being completed as symptoms worsened prior to period of decreased responsivity. Upper Body Bathing: Moderate assistance, Cueing for sequencing, Sitting Lower Body Bathing: Moderate assistance, Cueing for sequencing Upper Body Dressing : Moderate assistance, Sitting Lower Body Dressing: Moderate assistance, Sit to/from stand Lower Body Dressing Details (indicate cue type and reason): Able to physically don/doff socks but decreased ability to plan movement and raising  L LE rather than R despite attempts to raise R as well as verbal and tactile cues. Toilet Transfer: Moderate assistance, +2 for safety/equipment, Stand-pivot, RW, BSC Toilet Transfer Details (indicate cue type and reason): Pt required physical assistance to move R LE. DIfficulty coordinating and planning movements. Toileting- Clothing Manipulation and Hygiene: Maximal assistance, Sit to/from  stand General ADL Comments: Pt initially with good participation and communication despite decreased coordination and motor planning. However, after sitting in chair for approximately 10 minutes, motor planning began to significantly decline and pt requiring hand over hand assistance to initiate tooth brushing tasks. Pt becoming frustrated and then less responsive. When asked how he was feeling, pt reported dizziness and therapist reclined pt. Pt closing eyes and becoming less arousable and called RN to room. Approximately 30 seconds later on her arrival, pt returned to original conversational state and was better able to control movements. During this episode decreased strength in R LE and UE noted.    Mobility  Overal bed mobility: Needs Assistance Bed Mobility: Supine to Sit Rolling: Mod assist Sidelying to sit: Mod assist Supine to sit: Min guard Sit to sidelying: Mod assist, +2 for physical assistance (Pt was resisted physical assist, unclear whether due to tone) General bed mobility comments: Increased time to transition to EOB. Pt was able to complete transfer without physical assist, however close guard was provided for safety.     Transfers  Overall transfer level: Needs assistance Equipment used: Rolling walker (2 wheeled) Transfer via Lift Equipment: Stedy Transfers: Sit to/from Stand Sit to Stand: Min assist, +2 safety/equipment Stand pivot transfers: Mod assist, +2 physical assistance General transfer comment: Used the Stedy to pull to stand. Pt completed x3 Sit<>Stand.     Ambulation / Gait / Stairs / Wheelchair Mobility  Ambulation/Gait General Gait Details: Pt was able to participate in pre-gait activity while standing on Stedy including weight shifting, repositioning feet on footplate of Stedy, and attempting to lift heels.     Posture / Balance Dynamic Sitting Balance Sitting balance - Comments: Improved from initial eval Balance Overall balance assessment: Needs  assistance Sitting-balance support: Bilateral upper extremity supported Sitting balance-Leahy Scale: Fair Sitting balance - Comments: Improved from initial eval Postural control: Right lateral lean Standing balance support: Bilateral upper extremity supported Standing balance-Leahy Scale: Poor Standing balance comment: Reliant on RW    Special needs/care consideration BiPAP/CPAP No CPM No Continuous Drip IV No  Dialysis No      Life Vest No Oxygen No Special Bed No Trach Size No Wound Vac (area) No      Skin Dry skin                              Bowel mgmt: Last BM 03/13/17 with incontinence Bladder mgmt: Voiding with incontinence Diabetic mgmt Yes, on oral medications at home    Previous Home Environment Living Arrangements: Alone (Reported alone to OT but spouse to PT) Available Help at Discharge: Family Type of Home: House Home Layout: One level Home Access: Level entry Bathroom Shower/Tub: Engineer, manufacturing systems: Standard Bathroom Accessibility: Yes How Accessible: Accessible via walker Home Care Services: No  Discharge Living Setting Plans for Discharge Living Setting: Alone, Apartment Type of Home at Discharge: Apartment (1st floor apartment.) Discharge Home Layout: One level Discharge Home Access: Level entry Does the patient have any problems obtaining your medications?: No  Social/Family/Support Systems Patient Roles: Parent, Other (Comment) (Has a daughter and  daughter's boyfriend.) Contact Information: Lynford Humphrey - daughter - [phone] Anticipated Caregiver: self and daughter intermittently Ability/Limitations of Caregiver: At this point, patient does not have anyone to provide supervision after discharge from rehab. Caregiver Availability: Intermittent (Patient aware of likely need for supervision at D/C) Discharge Plan Discussed with Primary Caregiver: Yes Is Caregiver In Agreement with Plan?: Yes Does Caregiver/Family have Issues  with Lodging/Transportation while Pt is in Rehab?: No  Goals/Additional Needs Patient/Family Goal for Rehab: PT/OT supervision goals Expected length of stay: 10-14 days Cultural Considerations: None Dietary Needs: Carb mod, med cal, thin liquids Equipment Needs: TBD Pt/Family Agrees to Admission and willing to participate: Yes Program Orientation Provided & Reviewed with Pt/Caregiver Including Roles  & Responsibilities: Yes  Decrease burden of Care through IP rehab admission: N/A  Possible need for SNF placement upon discharge: Yes, if patient does not progress to point where he can have intermittent supervision at home and/or patient does not find a caregiver for discharge home.  Patient Condition: This patient's condition remains as documented in the consult dated 03/12/17, in which the Rehabilitation Physician determined and documented that the patient's condition is appropriate for intensive rehabilitative care in an inpatient rehabilitation facility. Will admit to inpatient rehab today.  Preadmission Screen Completed By:  Trish Mage, 03/13/2017 5:50 PM ______________________________________________________________________   Discussed status with Dr. Allena Katz on 03/13/17 at 1750 and received telephone approval for admission today.  Admission Coordinator:  Trish Mage, time 1750/Date 03/13/17       Cosigned by: Ankit Karis Juba, MD at 03/13/2017 5:58 PM  Revision History

## 2017-03-13 NOTE — Progress Notes (Signed)
Rehab admissions - I spoke with Dr. Franky Macho up on the unit.  He recommends a thoracic MRI to rule out any kind of lesion or abnormality.  At this point Dr. Franky Macho says he is not recommending any kind of surgery.  Once MRI is completed, I can potentially admit to acute inpatient rehab.  Call me for questions.  #[phone]

## 2017-03-13 NOTE — Progress Notes (Signed)
qPhysical Therapy Treatment Patient Details Name: Nicholas [name] MRN: [id number] DOB: [birth year] Today's Date: 03/13/2017    History of Present Illness Pt is a 66 y.o. male admitted for R LE weakness.  MRI 03/12/2017 showed no significant acute findings.  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    PT Comments    Pt progressing towards physical therapy goals. Was able to participate in sit<>stand activity with [facility] for pull to stand from center bar. Feel pt can progress next session to attempting ambulation. Overall pt self-doubting and states he does not feel he can progress. Provided max encouragement throughout session. Will continue to follow and progress as able per POC.    Follow Up Recommendations  CIR     Equipment Recommendations  Other (comment) (TBD by next venue of care)    Recommendations for Other Services Rehab consult     Precautions / Restrictions Precautions Precautions: Fall Restrictions Weight Bearing Restrictions: No    Mobility  Bed Mobility Overal bed mobility: Needs Assistance Bed Mobility: Supine to Sit     Supine to sit: Min guard     General bed mobility comments: Increased time to transition to EOB. Pt was able to complete transfer without physical assist, however close guard was provided for safety.   Transfers Overall transfer level: Needs assistance   Transfers: Sit to/from Stand Sit to Stand: Min assist;+2 safety/equipment         General transfer comment: Used the Stedy to pull to stand. Pt completed x3 Sit<>Stand.   Ambulation/Gait             General Gait Details: Pt was able to participate in pre-gait activity while standing on Stedy including weight shifting, repositioning feet on footplate of Stedy, and attempting to lift heels.    Stairs            Wheelchair Mobility    Modified Rankin (Stroke Patients Only)       Balance  Overall balance assessment: Needs assistance Sitting-balance support: Bilateral upper extremity supported Sitting balance-Leahy Scale: Fair Sitting balance - Comments: Improved from initial eval Postural control: Right lateral lean Standing balance support: Bilateral upper extremity supported Standing balance-Leahy Scale: Poor Standing balance comment: Reliant on RW                            Cognition Arousal/Alertness: Awake/alert Behavior During Therapy: Flat affect Overall Cognitive Status: History of cognitive impairments - at baseline                                 General Comments: Pt making several self-doubting comments. States he will never get better.      Exercises      General Comments        Pertinent Vitals/Pain Pain Assessment: No/denies pain Faces Pain Scale: Hurts a little bit Pain Location: grimacing and frustration with general movements Pain Descriptors / Indicators: Discomfort;Grimacing Pain Intervention(s): Monitored during session    Home Living Family/patient expects to be discharged to:: Private residence                    Prior Function            PT Goals (current goals can now be found in the care plan section) Acute Rehab PT Goals Patient  Stated Goal: To get better PT Goal Formulation: With patient Time For Goal Achievement: 03/26/17 Potential to Achieve Goals: Fair Progress towards PT goals: Progressing toward goals    Frequency    Min 5X/week      PT Plan Current plan remains appropriate    Co-evaluation             End of Session Equipment Utilized During Treatment: Gait belt Activity Tolerance: Patient tolerated treatment well Patient left: in chair;with call bell/phone within reach;with chair alarm set Nurse Communication: Mobility status PT Visit Diagnosis: Dizziness and giddiness (R42);Hemiplegia and hemiparesis;Other symptoms and signs involving the nervous system  (R29.898) Hemiplegia - Right/Left: Right Hemiplegia - dominant/non-dominant: Dominant Hemiplegia - caused by: Unspecified     Time: 1610-9604 PT Time Calculation (min) (ACUTE ONLY): 29 min  Charges:  $Gait Training: 23-37 mins                    G Codes:       Conni Slipper, PT, DPT Acute Rehabilitation Services Pager: [phone]    Marylynn Pearson 03/13/2017, 1:55 PM

## 2017-03-13 NOTE — Progress Notes (Signed)
Patient ID: ARYAV WIMBERLY, male   DOB: December 27, 1950, 66 y.o.   MRN: [id number] Patient arrived from [facility] at 11 with RN and patient belongings. Patient oriented to room and rehab process, nurse call bell. Patient resting comfortably in bed with no complaints of pain at this time.

## 2017-03-13 NOTE — Discharge Summary (Signed)
Name: Nicholas [name] MRN: [id number] DOB: May 23, 1951 66 y.o. PCP: [name], MD  Date of Admission: 03/11/2017  6:02 AM Date of Discharge: 03/13/2017 Attending Physician: Tyson Alias, MD  Discharge Diagnosis:  Principal Problem:   Lumbar radiculopathy Active Problems:   Hyperlipidemia   TOBACCO ABUSE   Essential hypertension   CAD, NATIVE VESSEL   Aortic valve disorder   Occlusion and stenosis of carotid artery   History of CVA (cerebrovascular accident)   Discharge Medications: Allergies as of 03/13/2017   No Known Allergies     Medication List    STOP taking these medications   glipiZIDE 5 MG 24 hr tablet Commonly known as:  GLUCOTROL XL     TAKE these medications   clopidogrel 75 MG tablet Commonly known as:  PLAVIX Take 1 tablet (75 mg total) by mouth daily with breakfast.   dicyclomine 20 MG tablet Commonly known as:  BENTYL Take 1 tablet (20 mg total) by mouth 2 (two) times daily.   digoxin 0.25 MG tablet Commonly known as:  LANOXIN Take 0.25 mg by mouth daily.   diltiazem 360 MG 24 hr capsule Commonly known as:  TAZTIA XT Take 1 capsule (360 mg total) by mouth daily.   docusate sodium 50 MG capsule Commonly known as:  COLACE Take 50 mg by mouth at bedtime.   doxazosin 2 MG tablet Commonly known as:  CARDURA Take 1 tablet (2 mg total) by mouth daily.   metoprolol 50 MG tablet Commonly known as:  LOPRESSOR Take 1 tablet (50 mg total) by mouth 2 (two) times daily.   rosuvastatin 10 MG tablet Commonly known as:  CRESTOR Take 1 tablet (10 mg total) by mouth daily.       Disposition and follow-up:   Nicholas.Nicholas [name] was discharged from [facility] in Stable condition.  At the hospital follow up visit please address:  1.  Bilateral Leg Weakness.  Chronic post-CVA left sided deficits and new right deficits interfering with ambulation.  No compression etiology revealed on MRI, no indication for neurosurgical  intervention.  Rehab to improve mobility and function.  2.  Urinary Retention.  Likely bladder outlet obstruction from BPH.  Needs urology follow-up, may need intermittent or foley catheter until that time.  3.  Diabetes.  Discontinued glipizide, A1c 5.4%.  4.  Aortic Stenosis.  Moderate AS on echo, asymptomatic, needs repeat in 1year.  5.  Labs / imaging needed at time of follow-up: none  6.  Pending labs/ test needing follow-up: none  Follow-up Appointments: Follow-up Information    [name],BETTI D, MD. Schedule an appointment as soon as possible for a visit in 2 week(s).   Specialty:  Family Medicine Contact information: [address] [place] [address] [phone]           Hospital Course by problem list: Principal Problem:   Lumbar radiculopathy Active Problems:   Hyperlipidemia   TOBACCO ABUSE   Essential hypertension   CAD, NATIVE VESSEL   Aortic valve disorder   Occlusion and stenosis of carotid artery   History of CVA (cerebrovascular accident)   1. Right Leg Weakness Nicholas [name] is a 66 year old man with prior CVA (w/ chronic left sided weakness), CAD, PAD, and carotid stenosis who presented with acute onset of right leg weakness which caused him to fall to the ground.  He was brought to the ED by EMS, but he left from the ED before he could  be fully evaluated.  His weakness persisted, however, and he returned to the ED when he still couldn't walk.  Code Stroke was called and he was evaluated by neurology, but CT head did not show intracranial hemorrhage and subsequent MRI did not reveal acute infarct.  Per his daughter, he is independent at baseline, walking with a cane at home and walker when out.  Compressive neuropathy or myelopathy were considered as alternative etiologies of his weakness, but MRIs of thoracic and lumbar spine showed chronic degenerative changes but no acute pathology to explain his new symptoms and no indication for  neurosurgical intervention.  2. Urinary Retention Chronic lower urinary tract symptoms from BPH, reports often straining to void at home.  He was complaining of inability to void here, and bladder scan revealed significant retention and in and out catheterization yielded 800 mL of urine.  Subsequently he was able to void on his own with straining, and required intermittent catheterization once more.  His renal function was stable and at baseline.  He will require monitoring for retention and outpatient urology follow-up.  3. History of CVA 4. CAD 5. PAD 6. Carotid Stenosis Continued on home plavix and rosuvastatin.  7. Aortic Stenosis Echocardiogram with moderate AS, 3/6 murmur, no symptoms of syncope or chest pain.  Annual monitoring by echocardiogram recommended.  8. Diabetes A1c of 5.4%, below goal.  Discontinued glipizide.  9. History of pSVT Continued on home digoxin, diltiazem and metoprolol held while stroke was ruled out and then resumed.  He was monitored on telemetry and stayed in NSR.  Discharge Vitals:   BP (!) 137/51 (BP Location: Right Arm)   Pulse 63   Temp 98.5 F (36.9 C) (Oral)   Resp 20   Ht  (1.803 m)   Wt 261 lb 4.8 oz (118.5 kg)   SpO2 97%   BMI 36.44 kg/m   Pertinent Labs, Studies, and Procedures:   MRI/MRA Brain 03/11/2017 IMPRESSION: 1. No acute intracranial abnormality. 2. Stable bilateral frontal and right parietal chronic infarctions. 3. Moderate to severe paranasal sinus mucosal thickening. 4. 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 5. Patent circle of Willis. Otherwise no evidence for high-grade stenosis, large vessel occlusion, or aneurysm.  MRI Lumbar Spine w/o Contrast 03/12/2017 IMPRESSION: 1. Congenitally narrow lumbar spinal canal with superimposed mild-to-moderate disc and facet degeneration. 2. Moderate spinal stenosis at L3-4. 3. Moderate lateral  recess stenosis at L4-5. 4. Moderate to severe neural foraminal stenosis on the left at L3-4 and bilaterally at L5-S1.  MRI Thoracic Spine w/o Contrast 03/13/2017 IMPRESSION: Mild thoracic degenerative disc disease with mild spinal canal stenosis at T5-T6 where a central disc protrusion indents the ventral spinal cord without causing signal change.  Lipid Panel     Component Value Date/Time   CHOL 111 03/12/2017 0330   TRIG 61 03/12/2017 0330   HDL 42 03/12/2017 0330   CHOLHDL 2.6 03/12/2017 0330   VLDL 12 03/12/2017 0330   LDLCALC 57 03/12/2017 0330   Lab Results  Component Value Date   HGBA1C 5.4 03/12/2017     Discharge Instructions: Discharge Instructions    Diet - low sodium heart healthy    Complete by:  As directed    If Cath X 3 Or Residual >300 Place Foley    Complete by:  As directed    Increase activity slowly    Complete by:  As directed    Measure post  void residual    Complete by:  As directed      You were admitted to the hospital because of weakness in your leg.  We were worried that you might have had a stroke, but the MRI of your brain showed that you did NOT have a new stroke.  We also did MRIs of your spine since pinched nerves in the back can cause weakness, which showed that you have arthritis in the spine but nothing new that caused this weakness.  You need to follow-up with a urologist since you are having troubles urinating because of your large prostate.  You diabetes is very well controlled, and you should stop taking glipizide.  If you ever have new weakness on one side, facial droop, difficulties talking, or pass out, please call [phone] and come back to the ED as these still could be signs of a stroke.  If you are unable to urinate, please call your PCP for an urgent appointment.  You will be discharged to the Inpatient Rehabilitation unit at [facility] to work on walking and getting stronger.  Signed: Alm Bustard, MD 03/13/2017, 4:33 PM    Pager: [phone]

## 2017-03-13 NOTE — Progress Notes (Signed)
  Echocardiogram 2D Echocardiogram has been performed.  Delcie Roch 03/13/2017, 8:29 AM

## 2017-03-14 ENCOUNTER — Inpatient Hospital Stay (HOSPITAL_COMMUNITY): Payer: Medicare Other

## 2017-03-14 ENCOUNTER — Inpatient Hospital Stay (HOSPITAL_COMMUNITY): Payer: Medicare Other | Admitting: Occupational Therapy

## 2017-03-14 ENCOUNTER — Inpatient Hospital Stay (HOSPITAL_COMMUNITY): Payer: Medicare Other | Admitting: Physical Therapy

## 2017-03-14 DIAGNOSIS — M21372 Foot drop, left foot: Secondary | ICD-10-CM

## 2017-03-14 DIAGNOSIS — M48061 Spinal stenosis, lumbar region without neurogenic claudication: Principal | ICD-10-CM

## 2017-03-14 DIAGNOSIS — I10 Essential (primary) hypertension: Secondary | ICD-10-CM

## 2017-03-14 DIAGNOSIS — M21371 Foot drop, right foot: Secondary | ICD-10-CM

## 2017-03-14 LAB — COMPREHENSIVE METABOLIC PANEL
ALK PHOS: 73 U/L (ref 38–126)
ALT: 30 U/L (ref 17–63)
AST: 48 U/L — AB (ref 15–41)
Albumin: 3.3 g/dL — ABNORMAL LOW (ref 3.5–5.0)
Anion gap: 9 (ref 5–15)
BUN: 10 mg/dL (ref 6–20)
CALCIUM: 9.2 mg/dL (ref 8.9–10.3)
CO2: 29 mmol/L (ref 22–32)
CREATININE: 1.12 mg/dL (ref 0.61–1.24)
Chloride: 103 mmol/L (ref 101–111)
GFR calc non Af Amer: >60 mL/min (ref >60)
Glucose, Bld: 81 mg/dL (ref 65–99)
Potassium: 3.8 mmol/L (ref 3.5–5.1)
SODIUM: 141 mmol/L (ref 135–145)
Total Bilirubin: 1 mg/dL (ref 0.3–1.2)
Total Protein: 6.2 g/dL — ABNORMAL LOW (ref 6.5–8.1)

## 2017-03-14 LAB — CBC WITH DIFFERENTIAL/PLATELET
BASOS PCT: 0 %
Basophils Absolute: 0 10*3/uL (ref 0.0–0.1)
EOS ABS: 0.4 10*3/uL (ref 0.0–0.7)
EOS PCT: 4 %
HCT: 40 % (ref 39.0–52.0)
HEMOGLOBIN: 13.6 g/dL (ref 13.0–17.0)
Lymphocytes Relative: 20 %
Lymphs Abs: 1.7 10*3/uL (ref 0.7–4.0)
MCH: 33.7 pg (ref 26.0–34.0)
MCHC: 34 g/dL (ref 30.0–36.0)
MCV: 99 fL (ref 78.0–100.0)
MONO ABS: 1.2 10*3/uL — AB (ref 0.1–1.0)
MONOS PCT: 15 %
NEUTROS PCT: 61 %
Neutro Abs: 5.2 10*3/uL (ref 1.7–7.7)
PLATELETS: 327 10*3/uL (ref 150–400)
RBC: 4.04 MIL/uL — ABNORMAL LOW (ref 4.22–5.81)
RDW: 12.3 % (ref 11.5–15.5)
WBC: 8.6 10*3/uL (ref 4.0–10.5)

## 2017-03-14 LAB — GLUCOSE, CAPILLARY
GLUCOSE-CAPILLARY: 88 mg/dL (ref 65–99)
GLUCOSE-CAPILLARY: 88 mg/dL (ref 65–99)
Glucose-Capillary: 97 mg/dL (ref 65–99)

## 2017-03-14 NOTE — Evaluation (Signed)
Physical Therapy Assessment and Plan  Patient Details  Name: Nicholas [name] MRN: [id number] Date of Birth: 04/02/1951  PT Diagnosis: Abnormality of gait, Hemiparesis non-dominant, Impaired cognition and Muscle weakness Rehab Potential: Good ELOS: 10 days   Today's Date: 03/14/2017 PT Individual Time: 0900-1000 PT Individual Time Calculation (min): 60 min    Problem List:  Patient Active Problem List   Diagnosis Date Noted  . Lumbar radiculopathy 03/13/2017  . Lumbar spinal stenosis 03/13/2017  . Foot drop, right   . Urinary retention due to benign prostatic hyperplasia   . Benign essential HTN   . History of CVA (cerebrovascular accident) 03/11/2017  . Cognitive deficits, late effect of cerebrovascular disease 07/23/2014  . 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 Aortic valve disorder 08/09/2010  . CLAUDICATION 08/09/2010  . Hyperlipidemia 08/08/2010  . TOBACCO ABUSE 08/08/2010  . Essential hypertension 08/08/2010  . CAD, NATIVE VESSEL 08/08/2010  . Occlusion and stenosis of carotid artery 08/08/2010    Past Medical History:  Past Medical History:  Diagnosis Date  . At high risk for falls   . BPH (benign prostatic hypertrophy)    HX RETENTION  . Carotid artery occlusion    S/P RIGHT CEA  . Coronary artery disease CARDIOLOGIST--  DR Rayann Heman   S/P  PCI TO LAD 1993  . GERD (gastroesophageal reflux disease)   . History of CVA (cerebrovascular accident)    1993---  NO RESIDUALS  . History of head injury    CONCUSSION--  NO RESIDUAL  . History of myocardial infarction    1993--  NON-Q WAVE  S/P PCI TO LAD  . Hydrocele, left   . Hyperlipidemia   . Hypertension   . Left carotid artery stenosis    MILD  . Myocardial infarction   . PSVT (paroxysmal supraventricular tachycardia) ([place])   . PVD (peripheral vascular disease) ([place])    S/P LEFT FEM-POP  . Right leg weakness    USES CANE--  SECONDARY TO PVD  . Type 2 diabetes mellitus ([place])   . Wears  glasses    Past Surgical History:  Past Surgical History:  Procedure Laterality Date  . CARDIAC CATHETERIZATION  01-18-2003   DR Johnsie Cancel   NON-OBSTRUCTIVE CAD/  PREVIOIS ANGIOPLASTY SITE WIDELY PATENT  . CARDIOVASCULAR STRESS TEST  2007   SMALL ANTERO-APICAL SCAR/  NO ISCHEMIA  . CAROTID ENDARTERECTOMY Right 05-31-2003  . CORONARY ANGIOPLASTY  1993   PCI TO LAD  . FEMORAL-POPLITEAL BYPASS GRAFT Left 01-19-2003  . HYDROCELE EXCISION Left 10/27/2013   Procedure: HYDROCELECTOMY ADULT;  Surgeon: Hanley Ben, MD;  [facility];  Service: Urology;  Laterality: Left;  . RIGHT HYDROCELECTOMY  05-31-2003  . TRANSTHORACIC ECHOCARDIOGRAM  08-24-2010   EF 55%/  MILD AORTIC INSUFFICENCY/  MODERATE LVH    Assessment & Plan Clinical Impression: Patient is a5 y.o.malewith history of CAD, T2DM with neuropathy, L-CVA with gait disorder and problems with initiation (CIR in 07/2014), concussion, CAD, back injury with left foot dropwho was admitted on 03/11/17 with acute RLE weakness and urinary retention. MRI/MRA brain done revealing no acute abnormality and stable bilateral frontal and right parietal chronic infarcts. Dr. Leonie Man was consulted for input and recommended lumbar spine due to concerns of lumbar spine disease v/s peroneal neuropathy as doubted stroke/TIA as cause of symptoms. MRI lumbar spine showed congenitally narrow lumbar spine with superimposed mild to moderate disc and facet degeneration, moderate spinal stenosis L3-4 and moderate to severe neural foraminal stenosis at  left L3-4 and bilaterally at L5-SI. 2 D echo with EF 60-65% with severely calcified leaflets with moderate AR. Per reports --case discussed with Dr. Christella Noa and thoracic MRI recommended. PT/OT evaluation done revealing deficits in mobility and ability to carry out ADL as well as cognitive deficits affecting tasks. CIR recommended for follow up therapy.  Patient transferred to CIR on 03/13/2017 .    Patient currently requires min with mobility secondary to muscle weakness, decreased cardiorespiratoy endurance, impaired timing and sequencing, motor apraxia, decreased coordination and decreased motor planning, decreased problem solving, decreased memory and delayed processing and decreased standing balance, decreased postural control, hemiplegia and decreased balance strategies.  Prior to hospitalization, patient was modified independent  with mobility and lived with   in a   home.  Home access is   .  Patient will benefit from skilled PT intervention to maximize safe functional mobility, minimize fall risk and decrease caregiver burden for planned discharge home with intermittent assist.  Anticipate patient will benefit from follow up [facility] at discharge.  PT - End of Session Activity Tolerance: Tolerates 30+ min activity with multiple rests Endurance Deficit: Yes Endurance Deficit Description: requires seated rest breaks after short duration mobility tasks PT Assessment Rehab Potential (ACUTE/IP ONLY): Good Barriers to Discharge: Decreased caregiver support PT Patient demonstrates impairments in the following area(s): Balance;Endurance;Motor;Safety;Sensory PT Transfers Functional Problem(s): Bed Mobility;Bed to Chair;Car;Furniture PT Locomotion Functional Problem(s): Ambulation;Wheelchair Mobility;Stairs PT Plan PT Intensity: Minimum of 1-2 x/day ,45 to 90 minutes PT Frequency: 5 out of 7 days PT Duration Estimated Length of Stay: 10 days PT Treatment/Interventions: Ambulation/gait training;Balance/vestibular training;Cognitive remediation/compensation;Community reintegration;Patient/family education;Stair training;DME/adaptive equipment instruction;Splinting/orthotics;UE/LE Coordination activities;UE/LE Strength taining/ROM;Therapeutic Exercise;Wheelchair propulsion/positioning;Therapeutic Activities;Psychosocial support;Functional mobility training;Discharge planning;Disease  management/prevention;Neuromuscular re-education PT Transfers Anticipated Outcome(s): modI PT Locomotion Anticipated Outcome(s): modI home, S stairs and community gait PT Recommendation Recommendations for Other Services: Therapeutic Recreation consult;Neuropsych consult Therapeutic Recreation Interventions: Outing/community reintergration Follow Up Recommendations: Home health PT Patient destination: Home Equipment Recommended: To be determined Equipment Details: has SPC and rollator  Skilled Therapeutic Intervention Pt received supine in bed with daughter present, denies pain and agreeable to treatment. PT initial evaluation performed and completed as described below with minA overall. Pt with significant gait impairments however suspect likely baseline as daughter reports pt was recently receiving PT services to address gait impairments including shuffling short steps and poor balance. Gait improved with multimodal cueing for normalized step length, however unable to maintain when cues removed. Educated pt and daughter on rehab process, goals, estimated LOS to be determined when all disciplines evaluations are completed. Pt educated on falls prevention and use of call bell for assist to have nursing staff present for all transfers; pt agreeable. Remained seated in w/c with chair alarm intact and all needs in reach at end of session.   PT Evaluation Precautions/Restrictions Precautions Precautions: Fall Restrictions Weight Bearing Restrictions: No General Chart Reviewed: Yes Response to Previous Treatment: Not applicable Family/Caregiver Present: Yes  Pain Pain Assessment Pain Assessment: No/denies pain Home Living/Prior Functioning Home Living Available Help at Discharge: Family;Available PRN/intermittently Type of Home: House Home Access: Level entry Home Layout: One level Bathroom Shower/Tub: Chiropodist: Standard Bathroom Accessibility: Yes  Lives With:  Alone Prior Function Comments: Pt states he is doing own ADL's, using rollator outside and cane inside. Vision/Perception    No visual changes reported from baseline Ideomotor apraxia/motor impersistence  Cognition Overall Cognitive Status: History of cognitive impairments - at baseline Arousal/Alertness: Awake/alert Orientation Level: Oriented to person;Oriented  to place;Disoriented to time;Oriented to situation Awareness: Impaired Awareness Impairment: Emergent impairment Safety/Judgment: Impaired Sensation Sensation Light Touch: Appears Intact Stereognosis: Not tested Hot/Cold: Appears Intact Proprioception: Impaired Detail Proprioception Impaired Details: Impaired LLE Coordination Gross Motor Movements are Fluid and Coordinated: No Fine Motor Movements are Fluid and Coordinated: No Motor  Motor Motor: Hemiplegia;Ataxia Motor - Skilled Clinical Observations: hemiplegia residual from previous CVA  Mobility Bed Mobility Bed Mobility: Supine to Sit;Sit to Supine Supine to Sit: 5: Supervision;HOB flat Supine to Sit Details: Verbal cues for precautions/safety;Verbal cues for technique Transfers Transfers: Yes Sit to Stand: 4: Min assist Sit to Stand Details: Verbal cues for technique;Verbal cues for precautions/safety;Verbal cues for safe use of DME/AE;Verbal cues for gait pattern Stand Pivot Transfers: 4: Min assist Stand Pivot Transfer Details: Verbal cues for gait pattern;Verbal cues for technique;Verbal cues for safe use of DME/AE;Verbal cues for precautions/safety Stand Pivot Transfer Details (indicate cue type and reason): cues for RW management, increased step length and reduced shuffling Locomotion  Ambulation Ambulation: Yes Ambulation/Gait Assistance: 4: Min assist;4: Min guard Ambulation Distance (Feet): 50 Feet Assistive device: Rolling walker Ambulation/Gait Assistance Details: Verbal cues for sequencing;Verbal cues for technique;Verbal cues for  precautions/safety;Verbal cues for safe use of DME/AE;Verbal cues for gait pattern Ambulation/Gait Assistance Details: repetitive multimodal cueing for larger step length, reduced shuffling Gait Gait: Yes Gait Pattern: Impaired Gait Pattern: Poor foot clearance - right;Poor foot clearance - left;Wide base of support;Shuffle;Festinating;Decreased stride length;Step-to pattern;Right foot flat;Left foot flat Gait velocity: significantly reduced Stairs / Additional Locomotion Stairs: No Architect: Yes Wheelchair Assistance: 5: Investment banker, operational Details: Verbal cues for sequencing;Verbal cues for Marketing executive: Both upper extremities Wheelchair Parts Management: Needs assistance Distance: 150'  Trunk/Postural Assessment  Cervical Assessment Cervical Assessment: Within Functional Limits Thoracic Assessment Thoracic Assessment: Within Functional Limits Lumbar Assessment Lumbar Assessment: Within Functional Limits Postural Control Postural Control: Deficits on evaluation (delayed/inefficient stepping/righting reactions in standing)  Balance Balance Balance Assessed: Yes Static Sitting Balance Static Sitting - Balance Support: Feet supported;No upper extremity supported Static Sitting - Level of Assistance: 6: Modified independent (Device/Increase time) Dynamic Sitting Balance Dynamic Sitting - Balance Support: Feet supported;During functional activity Dynamic Sitting - Level of Assistance: 5: Stand by assistance Static Standing Balance Static Standing - Balance Support: Bilateral upper extremity supported Static Standing - Level of Assistance: 5: Stand by assistance Dynamic Standing Balance Dynamic Standing - Balance Support: During functional activity;Bilateral upper extremity supported Dynamic Standing - Level of Assistance: 4: Min assist Extremity Assessment  RUE Assessment RUE Assessment: Within Functional  Limits LUE Assessment LUE Assessment: Within Functional Limits RLE Assessment RLE Assessment: Within Functional Limits (grossly 4+/5 thoughout) LLE Assessment LLE Assessment: Exceptions to [facility] LLE Strength LLE Overall Strength: Deficits;Due to premorbid status Left Hip Flexion: 3-/5 Left Knee Flexion: 3-/5 Left Knee Extension: 4+/5 Left Ankle Dorsiflexion: 1/5 Left Ankle Plantar Flexion: 1/5   See Function Navigator for Current Functional Status.   Refer to Care Plan for Long Term Goals  Recommendations for other services: Neuropsych and Therapeutic Recreation  Outing/community reintegration  Discharge Criteria: Patient will be discharged from PT if patient refuses treatment 3 consecutive times without medical reason, if treatment goals not met, if there is a change in medical status, if patient makes no progress towards goals or if patient is discharged from hospital.  The above assessment, treatment plan, treatment alternatives and goals were discussed and mutually agreed upon: by patient and by family  Luberta Mutter 03/14/2017, 1:18 PM

## 2017-03-14 NOTE — Progress Notes (Signed)
Physical Therapy Session Note  Patient Details  Name: Nicholas [name] MRN: [id number] Date of Birth: [birth year]  Today's Date: 03/14/2017 PT Individual Time: 1300-1415 PT Individual Time Calculation (min): 75 min   Short Term Goals: Week 1:  PT Short Term Goal 1 (Week 1): =LTG due to estimated length of stay  Skilled Therapeutic Interventions/Progress Updates:  W/c propulsion for general UE strengthening and endurance and increasing independence with mobility in the room at supervision level. Pt transferred with overall min assist throughout session with cues for hand placement and requires assist for balance and cues for increasing step length. Pt with no foot clearance during basic transfers, just shuffling. Simulated car transfer as above with overall min assist and independent with bringing LE's in and out of the car. Stair negotiation on 3" steps with rails for neuro re-ed and overall community mobility training with min assist - extra time for accurate L foot placement due to impaired coordination and cues for safety. Pt able to clear R and L foot without difficulty. Gait training with RW with focus on gait pattern and safety with AD with verbal cues for step length, foot clearance, and safety - verbal cues for cadence also with minimal improvement - as gait distance increased, more fluid gait pattern noted. Neuro re-ed in parallel bars to attempt gait without UE support but pt unable, min assist with RUE support. On compliant surface worked on neuro re-ed for balance strategies and postural control re-training with overall min assist and use of UE's needed to regain balance. nustep x 10 min on level 6 with BUE and BLE for reciprocal movement pattern retraining with cues for increasing speed to work on carryover with cadence during gait. Pt noted with delayed processing during session requiring mod cues for sequencing and problem solving during mobility.   Therapy Documentation Precautions:   Restrictions Weight Bearing Restrictions: No  Pain:  No complaints.    See Function Navigator for Current Functional Status.   Therapy/Group: Individual Therapy  Karolee Stamps Darrol Poke, PT, DPT  03/14/2017, 2:40 PM

## 2017-03-14 NOTE — Progress Notes (Signed)
[facility] PHYSICAL MEDICINE & REHABILITATION     PROGRESS NOTE    Subjective/Complaints: Lying in bed. "my tailbone is sore". Still feels weak  ROS: pt denies nausea, vomiting, diarrhea, cough, shortness of breath or chest pain   Objective: Vital Signs: Blood pressure (!) 140/44, pulse 67, temperature 98.4 F (36.9 C), temperature source Oral, resp. rate 18, height  (1.803 m), weight 101.4 kg (223 lb 9.6 oz), SpO2 95 %. Mr Thoracic Spine Wo Contrast  Result Date: 03/13/2017 CLINICAL DATA:  Right leg weakness EXAM: MRI THORACIC SPINE WITHOUT CONTRAST TECHNIQUE: Multiplanar, multisequence MR imaging of the thoracic spine was performed. No intravenous contrast was administered. COMPARISON:  Lumbar spine MRI 03/12/2017 FINDINGS: Alignment:  Normal Vertebrae: No acute compression fracture, discitis-osteomyelitis, facet edema or other focal marrow lesion. No epidural collection. Cord:  Normal caliber and signal. Paraspinal and other soft tissues: Unremarkable Disc levels: T4-T5: Mild disc herniation narrowing the ventral CSF space. No spinal canal stenosis. T5-T6: Small central disc herniation narrowing the ventral CSF space and indenting the spinal cord. No cord signal change. The other levels show no significant disc herniation, spinal canal stenosis or neural foraminal narrowing. IMPRESSION: Mild thoracic degenerative disc disease with mild spinal canal stenosis at T5-T6 where a central disc protrusion indents the ventral spinal cord without causing signal change. Electronically Signed   By: Deatra Robinson M.D.   On: 03/13/2017 15:04   Mr Lumbar Spine Wo Contrast  Result Date: 03/12/2017 CLINICAL DATA:  Right foot drop.  Urinary retention. EXAM: MRI LUMBAR SPINE WITHOUT CONTRAST TECHNIQUE: Multiplanar, multisequence MR imaging of the lumbar spine was performed. No intravenous contrast was administered. COMPARISON:  CT abdomen and pelvis 10/09/2015 FINDINGS: Segmentation:  Standard. Alignment:   Normal. Vertebrae: No evidence of fracture, suspicious osseous lesion, or significant marrow edema. Mild degenerative endplate marrow changes at L5-S1, predominantly type 2. Conus medullaris: Extends to the upper L2 level and appears normal. Fatty filum. Paraspinal and other soft tissues: Unremarkable. Disc levels: Disc desiccation from L2-3 to L5-S1 without significant disc space height loss. Diffuse narrowing of the lumbar spinal canal on a congenital basis due to short pedicles. T12-L1 and L1-2:  Negative. L2-3: Mild disc bulging and slight facet hypertrophy result in mild bilateral neural foraminal stenosis without significant spinal stenosis. L3-4: Circumferential disc bulging, congenitally short pedicles, and moderate facet and ligamentum flavum hypertrophy result in moderate spinal stenosis, left greater than right lateral recess stenosis, and mild-to-moderate right and moderate to severe left neural foraminal stenosis. L4-5: Circumferential disc bulging, congenitally short pedicles, and moderate facet and ligamentum flavum hypertrophy result in mild spinal stenosis, moderate left greater than right lateral recess stenosis, and mild-to-moderate bilateral neural foraminal stenosis. Bilateral paracentral annular fissures. L5-S1: Circumferential disc bulging, superimposed small central disc protrusion, and mild facet and ligamentum flavum hypertrophy result in mild bilateral lateral recess stenosis and moderate to severe bilateral neural foraminal stenosis without significant spinal stenosis. IMPRESSION: 1. Congenitally narrow lumbar spinal canal with superimposed mild-to-moderate disc and facet degeneration. 2. Moderate spinal stenosis at L3-4. 3. Moderate lateral recess stenosis at L4-5. 4. Moderate to severe neural foraminal stenosis on the left at L3-4 and bilaterally at L5-S1. Electronically Signed   By: Sebastian Ache M.D.   On: 03/12/2017 11:42    Recent Labs  03/14/17 0544  WBC 8.6  HGB 13.6  HCT  40.0  PLT 327    Recent Labs  03/12/17 0330 03/14/17 0544  NA 140 141  K 3.6 3.8  CL  105 103  GLUCOSE 76 81  BUN 9 10  CREATININE 1.13 1.12  CALCIUM 9.3 9.2   CBG (last 3)   Recent Labs  03/13/17 0630 03/13/17 1129 03/13/17 1641  GLUCAP 108* 113* 148*    Wt Readings from Last 3 Encounters:  03/13/17 101.4 kg (223 lb 9.6 oz)  03/12/17 118.5 kg (261 lb 4.8 oz)  03/10/17 117.9 kg (260 lb)    Physical Exam:  Constitutional: He is oriented to person, place, and time. He appears well-developed and well-nourished.  HENT:  Head: Normocephalic and atraumatic.  Mouth/Throat: Oropharynx is clear and moist.  Eyes: Conjunctivae and EOM are normal. Pupils are equal, round, and reactive to light.  Neck: Normal range of motion. Neck supple.  Healed old incision right neck with keloid formation  Cardiovascular: RRR.   Respiratory: CTA B GI: soft NT/ND  Musculoskeletal: He exhibits no edema or tenderness.  Neurological: He is alert and oriented to person, place, and time.   Marland Kitchen  Speech clear and able to follow simple commands without difficulty.   Motor: 5/5 in bilateral deltoid, bicep, tricep, grip RLE: HF, KE 4/5, 3- to 3/5 ADF/PF LLE: 4/5 HF, KE, 1-2/5 ADF/PF Senses pain in all 4's Slow processing. Fair insight.  Skin: Skin is warm and dry. He is not diaphoretic. Sacral area intact.  Psychiatric: affect flat  Assessment/Plan: 1. Gait abnormality and functional deficits secondary to Right foot drop, lumbar stenosis, worsened by recent fall and pre-existing left foot drop which require 3+ hours per day of interdisciplinary therapy in a comprehensive inpatient rehab setting. Physiatrist is providing close team supervision and 24 hour management of active medical problems listed below. Physiatrist and rehab team continue to assess barriers to discharge/monitor patient progress toward functional and medical goals.  Function:  Bathing Bathing position      Bathing parts       Bathing assist        Upper Body Dressing/Undressing Upper body dressing                    Upper body assist        Lower Body Dressing/Undressing Lower body dressing                                  Lower body assist        Toileting Toileting          Toileting assist     Transfers Chair/bed transfer             Locomotion Ambulation           Wheelchair          Cognition Comprehension    Expression    Social Interaction    Problem Solving    Memory     Medical Problem List and Plan: 1.  Abnormality of gait, limitation in self-care secondary to acute right foot drop as well as right quadricep weakness, history of lumbar spinal stenosis with recent fall, superimposed on prior left foot drop.  -beginning therapies today 2.  DVT Prophylaxis/Anticoagulation: Pharmaceutical: Lovenox 3. Pain Management: tylenol prn 4. Mood: LCSW to follow for evaluation and support.  5. Neuropsych: This patient is not fully capable of making decisions on his own behalf. 6. Skin/Wound Care: routine pressure relief measures.  7. Fluids/Electrolytes/Nutrition: encourage po  -labs pending. 8. HTN: Monitor BP bid. Continue Cardura, Cardizem and lanoxin   -  normotensive at present 9. H/o CVA: On Plavix and Crestor.  10 Urinary retention: Monitor voiding with PVR checks.   -ua negative. ucx pending. LOS (Days) 1 A FACE TO FACE EVALUATION WAS PERFORMED  Ranelle Oyster, MD 03/14/2017 8:39 AM

## 2017-03-14 NOTE — Progress Notes (Signed)
Patient information reviewed and entered into eRehab system by Gurnoor Ursua, RN, CRRN, PPS Coordinator.  Information including medical coding and functional independence measure will be reviewed and updated through discharge.     Per nursing patient was given "Data Collection Information Summary for Patients in Inpatient Rehabilitation Facilities with attached "Privacy Act Statement-Health Care Records" upon admission.  

## 2017-03-14 NOTE — Evaluation (Signed)
Occupational Therapy Assessment and Plan  Patient Details  Name: GARIK DIAMANT MRN: [id number] Date of Birth: [birth year]  OT Diagnosis: cognitive deficits, hemiplegia affecting non-dominant side, muscle weakness (generalized) and coordination disorder Rehab Potential: Rehab Potential (ACUTE ONLY): Good ELOS: 10 days   Today's Date: 03/14/2017 OT Individual Time: 1100-1200 OT Individual Time Calculation (min): 60 min     Problem List:  Patient Active Problem List   Diagnosis Date Noted  . Lumbar radiculopathy 03/13/2017  . Lumbar spinal stenosis 03/13/2017  . Foot drop, right   . Urinary retention due to benign prostatic hyperplasia   . Benign essential HTN   . History of CVA (cerebrovascular accident) 03/11/2017  . Cognitive deficits, late effect of cerebrovascular disease 07/23/2014  . 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 Aortic valve disorder 08/09/2010  . CLAUDICATION 08/09/2010  . Hyperlipidemia 08/08/2010  . TOBACCO ABUSE 08/08/2010  . Essential hypertension 08/08/2010  . CAD, NATIVE VESSEL 08/08/2010  . Occlusion and stenosis of carotid artery 08/08/2010    Past Medical History:  Past Medical History:  Diagnosis Date  . At high risk for falls   . BPH (benign prostatic hypertrophy)    HX RETENTION  . Carotid artery occlusion    S/P RIGHT CEA  . Coronary artery disease CARDIOLOGIST--  DR Rayann Heman   S/P  PCI TO LAD 1993  . GERD (gastroesophageal reflux disease)   . History of CVA (cerebrovascular accident)    1993---  NO RESIDUALS  . History of head injury    CONCUSSION--  NO RESIDUAL  . History of myocardial infarction    1993--  NON-Q WAVE  S/P PCI TO LAD  . Hydrocele, left   . Hyperlipidemia   . Hypertension   . Left carotid artery stenosis    MILD  . Myocardial infarction   . PSVT (paroxysmal supraventricular tachycardia) ([place])   . PVD (peripheral vascular disease) ([place])    S/P LEFT FEM-POP  . Right leg weakness    USES CANE--   SECONDARY TO PVD  . Type 2 diabetes mellitus ([place])   . Wears glasses    Past Surgical History:  Past Surgical History:  Procedure Laterality Date  . CARDIAC CATHETERIZATION  01-18-2003   DR Johnsie Cancel   NON-OBSTRUCTIVE CAD/  PREVIOIS ANGIOPLASTY SITE WIDELY PATENT  . CARDIOVASCULAR STRESS TEST  2007   SMALL ANTERO-APICAL SCAR/  NO ISCHEMIA  . CAROTID ENDARTERECTOMY Right 05-31-2003  . CORONARY ANGIOPLASTY  1993   PCI TO LAD  . FEMORAL-POPLITEAL BYPASS GRAFT Left 01-19-2003  . HYDROCELE EXCISION Left 10/27/2013   Procedure: HYDROCELECTOMY ADULT;  Surgeon: Hanley Ben, MD;  Location: [facility];  Service: Urology;  Laterality: Left;  . RIGHT HYDROCELECTOMY  05-31-2003  . TRANSTHORACIC ECHOCARDIOGRAM  08-24-2010   EF 55%/  MILD AORTIC INSUFFICENCY/  MODERATE LVH    Assessment & Plan Clinical Impression: Patient is a 66 y.o. year old male with history of CAD, T2DM with neuropathy, L-CVA with gait disorder and problems with initiation (CIR in 07/2014), concussion, CAD, back injury with left foot dropwho was admitted on 03/11/17 with acute RLE weakness and urinary retention. History taken from chart review. MRI brain reviewed, showing chronic infarcts.  Per report, MRI/MRA brain done revealing no acute abnormality and stable bilateral frontal and right parietal chronic infarcts. Dr. Leonie Man was consulted for input and recommended imaging of lumbar spine due to concerns of lumbar spine disease v/s peroneal neuropathy as doubted stroke/TIA as cause of symptoms. MRI  lumbar spine showed congenitally narrow lumbar spine with superimposed mild to moderate disc and facet degeneration, moderate spinal stenosis L3-4 and moderate to severe neural foraminal stenosis at left L3-4 and bilaterally at L5-SI.  2 D echo with EF 60-65% with severely calcified leaflets with moderate AR. Per reports --case discussed with Dr. Christella Noa and thoracic MRI recommended, reviewed, showing, .  PT/OT evaluation done  revealing deficits in mobility and ability to carry out ADL as well as cognitive deficits affecting tasks..  Patient transferred to CIR on 03/13/2017 .    Patient currently requires min with basic self-care skills secondary to muscle weakness, decreased cardiorespiratoy endurance, ataxia and decreased coordination, decreased awareness, decreased problem solving, decreased safety awareness and decreased memory and decreased standing balance, hemiplegia and decreased balance strategies.  Prior to hospitalization, patient could complete ADLs and IADLs with modified independent .  Patient will benefit from skilled intervention to increase independence with basic self-care skills prior to discharge home with care partner.  Anticipate patient will require intermittent supervision and follow up home health.  OT - End of Session Activity Tolerance: Decreased this session Endurance Deficit: Yes Endurance Deficit Description: requires seated rest breaks with self care tasks OT Assessment Rehab Potential (ACUTE ONLY): Good Barriers to Discharge: Decreased caregiver support OT Patient demonstrates impairments in the following area(s): Balance;Endurance;Cognition;Safety OT Basic ADL's Functional Problem(s): Grooming;Bathing;Dressing;Toileting OT Advanced ADL's Functional Problem(s): Simple Meal Preparation;Laundry;Light Housekeeping OT Transfers Functional Problem(s): Toilet;Tub/Shower OT Additional Impairment(s): None OT Plan OT Intensity: Minimum of 1-2 x/day, 45 to 90 minutes OT Frequency: 5 out of 7 days OT Duration/Estimated Length of Stay: 10 days OT Treatment/Interventions: Balance/vestibular training;Cognitive remediation/compensation;Discharge planning;Functional mobility training;Therapeutic Activities;UE/LE Coordination activities;Patient/family education;Community reintegration;DME/adaptive equipment instruction;UE/LE Strength taining/ROM;Therapeutic Exercise;Self Care/advanced ADL  retraining;Neuromuscular re-education OT Self Feeding Anticipated Outcome(s): n/a OT Basic Self-Care Anticipated Outcome(s): mod I  OT Toileting Anticipated Outcome(s): mod I  OT Bathroom Transfers Anticipated Outcome(s): mod I - S OT Recommendation Recommendations for Other Services: Other (comment) (none) Patient destination: Home Follow Up Recommendations: 24 hour supervision/assistance;Home health OT Equipment Recommended: To be determined   Skilled Therapeutic Intervention Upon entering the room, pt seated in wheelchair awaiting therapist with no c/o pain this session. Pt agreeable to OT intervention. OT educated pt on OT purpose, POC, and goals with pt verbalizing understanding and agreement. Pt ambulating to bathroom with RW and min A with pt displaying a festinating gait. Pt engaged in bathing from shower level seated on TTB. Pt engaged in bathing with sit <>stand from TTB with min A and use of grab bar. Pt ambulating from bathroom to wheelchair in same manner with min cues to stay within RW for safety. Pt donned underwear with min A for balance when standing to pull over B hips. Pt donning bath robe with min assist for balance with no UE support. Pt remained seated in wheelchair at end of session with quick release belt donned and chair alarm activated.  OT Evaluation Precautions/Restrictions  Precautions Precautions: Fall Restrictions Weight Bearing Restrictions: No General   Vital Signs Therapy Vitals Temp: 98.6 F (37 C) Temp Source: Oral Pulse Rate: (!) 59 Resp: 17 BP: (!) 140/36 (RN notified) Patient Position (if appropriate): Sitting Oxygen Therapy SpO2: 96 % O2 Device: Not Delivered Pain Pain Assessment Pain Assessment: No/denies pain Home Living/Prior Functioning Home Living Family/patient expects to be discharged to:: Private residence Living Arrangements: Alone Available Help at Discharge: Family, Available PRN/intermittently Type of Home: House Home  Access: Level entry Home Layout: One level Bathroom  Shower/Tub: Optometrist: Yes  Lives With: Alone Prior Function Comments: Pt states he is doing own ADL's, using rollator outside and cane inside. Vision/Perception  Vision- History Baseline Vision/History: Wears glasses Wears Glasses: At all times Patient Visual Report: No change from baseline  Cognition Overall Cognitive Status: History of cognitive impairments - at baseline Arousal/Alertness: Awake/alert Orientation Level: Person;Place;Situation Person: Oriented Place: Oriented Situation: Oriented Year: 2018 Month: April Day of Week: Incorrect Immediate Memory Recall: Sock;Blue;Bed Memory Recall: Sock;Blue;Bed Memory Recall Sock: Without Cue Memory Recall Blue: With Cue Memory Recall Bed: With Cue Awareness: Impaired Awareness Impairment: Emergent impairment Safety/Judgment: Impaired Sensation Sensation Light Touch: Appears Intact Stereognosis: Not tested Hot/Cold: Appears Intact Proprioception: Impaired Detail Proprioception Impaired Details: Impaired LLE Coordination Gross Motor Movements are Fluid and Coordinated: No Fine Motor Movements are Fluid and Coordinated: No Motor  Motor Motor: Hemiplegia;Ataxia Motor - Skilled Clinical Observations: hemiplegia residual from previous CVA Mobility  Bed Mobility Bed Mobility: Supine to Sit;Sit to Supine Supine to Sit: 5: Supervision;HOB flat Supine to Sit Details: Verbal cues for precautions/safety;Verbal cues for technique Transfers Sit to Stand: 4: Min assist Sit to Stand Details: Verbal cues for technique;Verbal cues for precautions/safety;Verbal cues for safe use of DME/AE;Verbal cues for gait pattern   Balance Balance Balance Assessed: Yes Dynamic Sitting Balance Dynamic Sitting - Balance Support: Feet supported;During functional activity Dynamic Sitting - Level of Assistance: 5: Stand by  assistance Dynamic Standing Balance Dynamic Standing - Balance Support: During functional activity Dynamic Standing - Level of Assistance: 4: Min assist Extremity/Trunk Assessment RUE Assessment RUE Assessment: Within Functional Limits LUE Assessment LUE Assessment: Within Functional Limits   See Function Navigator for Current Functional Status.   Refer to Care Plan for Long Term Goals  Recommendations for other services: None    Discharge Criteria: Patient will be discharged from OT if patient refuses treatment 3 consecutive times without medical reason, if treatment goals not met, if there is a change in medical status, if patient makes no progress towards goals or if patient is discharged from hospital.  The above assessment, treatment plan, treatment alternatives and goals were discussed and mutually agreed upon: by patient  Gypsy Decant, MS, OTR/L 03/14/2017, 3:24 PM

## 2017-03-15 ENCOUNTER — Inpatient Hospital Stay (HOSPITAL_COMMUNITY): Payer: Medicare Other

## 2017-03-15 ENCOUNTER — Inpatient Hospital Stay (HOSPITAL_COMMUNITY): Payer: Medicare Other | Admitting: Physical Therapy

## 2017-03-15 LAB — GLUCOSE, CAPILLARY: Glucose-Capillary: 93 mg/dL (ref 65–99)

## 2017-03-15 LAB — URINE CULTURE: Culture: <10000 — AB

## 2017-03-15 NOTE — IPOC Note (Signed)
Overall Plan of Care [facility]) Patient Details Name: Nicholas [name] MRN: [id number] DOB: February 14, 1951  Admitting Diagnosis: spinal stenosis  w foot drop   Hospital Problems: Active Problems:   Lumbar spinal stenosis     Functional Problem List: Nursing Safety, Bladder, Medication Management, Endurance, Skin Integrity  PT Balance, Endurance, Motor, Safety, Sensory  OT Balance, Endurance, Cognition, Safety  SLP    TR         Basic ADL's: OT Grooming, Bathing, Dressing, Toileting     Advanced  ADL's: OT Simple Meal Preparation, Laundry, Light Housekeeping     Transfers: PT Bed Mobility, Bed to Chair, Car, Occupational psychologist, Research scientist (life sciences): PT Ambulation, Psychologist, prison and probation services, Stairs     Additional Impairments: OT None  SLP        TR      Anticipated Outcomes Item Anticipated Outcome  Self Feeding n/a  Swallowing      Basic self-care  mod I   Toileting  mod I    Bathroom Transfers mod I - S  Bowel/Bladder  mod assist  Transfers  modI  Locomotion  modI home, S stairs and community gait  Communication     Cognition     Pain  <3  Safety/Judgment  up with min/mod assist    Therapy Plan: PT Intensity: Minimum of 1-2 x/day ,45 to 90 minutes PT Frequency: 5 out of 7 days PT Duration Estimated Length of Stay: 10 days OT Intensity: Minimum of 1-2 x/day, 45 to 90 minutes OT Frequency: 5 out of 7 days OT Duration/Estimated Length of Stay: 10 days         Team Interventions: Nursing Interventions Patient/Family Education, Bladder Management, Pain Management, Medication Management, Skin Care/Wound Management, Discharge Planning  PT interventions Ambulation/gait training, Balance/vestibular training, Cognitive remediation/compensation, Community reintegration, Equities trader education, Museum/gallery curator, Fish farm manager, Splinting/orthotics, UE/LE Coordination activities, UE/LE Strength taining/ROM, Therapeutic Exercise, Wheelchair  propulsion/positioning, Therapeutic Activities, Psychosocial support, Functional mobility training, Discharge planning, Disease management/prevention, Neuromuscular re-education  OT Interventions Warden/ranger, Cognitive remediation/compensation, Discharge planning, Functional mobility training, Therapeutic Activities, UE/LE Coordination activities, Patient/family education, Firefighter, Fish farm manager, UE/LE Strength taining/ROM, Therapeutic Exercise, Self Care/advanced ADL retraining, Neuromuscular re-education  SLP Interventions    TR Interventions    SW/CM Interventions Discharge Planning, Psychosocial Support, Patient/Family Education    Team Discharge Planning: Destination: PT-Home ,OT- Home , SLP-  Projected Follow-up: PT-Home health PT, OT-  24 hour supervision/assistance, Home health OT, SLP-  Projected Equipment Needs: PT-To be determined, OT- To be determined, SLP-  Equipment Details: PT-has SPC and rollator, OT-  Patient/family involved in discharge planning: PT- Family member/caregiver, Patient,  OT-Patient, SLP-   MD ELOS: 10 days Medical Rehab Prognosis:  Excellent Assessment: The patient has been admitted for CIR therapies with the diagnosis of lumbar stenosis with foot drop. The team will be addressing functional mobility, strength, stamina, balance, safety, adaptive techniques and equipment, self-care, bowel and bladder mgt, patient and caregiver education, pain mgt, orthotic needs, ego support, community reintegration. Goals have been set at mod I for mobility and self-care.    Ranelle Oyster, MD, FAAPMR      See Team Conference Notes for weekly updates to the plan of care

## 2017-03-15 NOTE — Care Management Note (Signed)
Inpatient Rehabilitation Center Individual Statement of Services  Patient Name:  Nicholas [name]  Date:  03/15/2017  Welcome to the Inpatient Rehabilitation Center.  Our goal is to provide you with an individualized program based on your diagnosis and situation, designed to meet your specific needs.  With this comprehensive rehabilitation program, you will be expected to participate in at least 3 hours of rehabilitation therapies Monday-Friday, with modified therapy programming on the weekends.  Your rehabilitation program will include the following services:  Physical Therapy (PT), Occupational Therapy (OT), 24 hour per day rehabilitation nursing, Case Management (Social Worker), Rehabilitation Medicine, Nutrition Services and Pharmacy Services  Weekly team conferences will be held on Tuesdays to discuss your progress.  Your Social Worker will talk with you frequently to get your input and to update you on team discussions.  Team conferences with you and your family in attendance may also be held.  Expected length of stay: 10 days  Overall anticipated outcome: modified independent  Depending on your progress and recovery, your program may change. Your Social Worker will coordinate services and will keep you informed of any changes. Your Social Worker's name and contact numbers are listed  below.  The following services may also be recommended but are not provided by the Inpatient Rehabilitation Center:   Driving Evaluations  Home Health Rehabiltiation Services  Outpatient Rehabilitation Services    Arrangements will be made to provide these services after discharge if needed.  Arrangements include referral to agencies that provide these services.  Your insurance has been verified to be:  Medicare Your primary doctor is:  Dr. [name]  Pertinent information will be shared with your doctor and your insurance company.  Social Worker:  [place], [place] [phone] or [phone]    Information discussed with and copy given to patient by: Amada Jupiter, 03/15/2017, 2:37 PM

## 2017-03-15 NOTE — Progress Notes (Signed)
Physical Therapy Session Note  Patient Details  Name: Nicholas [name] MRN: [id number] Date of Birth: 02/16/1951  Today's Date: 03/15/2017 PT Individual Time: 1000-1059, 8657-8469 PT Individual Time Calculation (min): 59 min , 46 min  Short Term Goals: Week 1:  PT Short Term Goal 1 (Week 1): =LTG due to estimated length of stay  Skilled Therapeutic Interventions/Progress Updates:    Pt up in w/c upon arrival, agreeable to PT session. Pt propelling to<>from gym with supervision. Transfers: sit<>stand min guard with cues for hand placement. Cues for big movements with pivot transfers using rw. Gait: ambulating variable distanced throughout session with max distance of 110 ft using rw. Working on big strides. Shuffling pattern most prevalent with initial steps and turns. Balance: Standing static and dynamic balance activities; including to taps on 6 inch step and LE placement to floor targets. Following session, pt up in w/c in room with chair alarm on and all needs in reach.  Session 2: Pt in room sitting in w/c, agreeable to PT session. Pt propelling w/c 200 ft from room to gym, supervision provided. Transfers: sit<>stand with min guard assistance, occasional cues for hand placement. Gait: Ambulating multiple distances in gym, working on stride length and turns. Decreased stability with turning, increased shuffling pattern and decreased stability. Ambulating 180 ft with rw and min guard assist at end of session. Balance and proprioceptive activities performed in standing working on bilateral LE control/coordination. Including cone taps, step over/back foam block,, floor target touching. Following session pt sitting in w/c with chair alarm on and all needs in reach.  Therapy Documentation Precautions:  Precautions Precautions: Fall Restrictions Weight Bearing Restrictions: No   Pain: Denies pain.   See Function Navigator for Current Functional Status.   Therapy/Group: Individual  Therapy  Delton See, PT 03/15/2017, 12:35 PM

## 2017-03-15 NOTE — Progress Notes (Signed)
Occupational Therapy Note  Patient Details  Name: Nicholas [name] MRN: [id number] Date of Birth: 12-06-1951  Today's Date: 03/15/2017 OT Individual Time: 1330-1400 OT Individual Time Calculation (min): 30 min   Pt denied pain Individual Therapy  Pt initially practiced tub bench transfers at supervision level.  Pt continues to exhibit shuffling gait.  Pt engaged in marching in place task with alternating RLE and LLE.  Pt noted with limited ability to alternate steps.  Pt able to perform task (lifting R knee and then L knee) on command but unable to maintain rhythm. Pt states this is a new phenomenon.  Pt returned to room and remained in w/c with chair alarm activated.    Lavone Neri [facility] 03/15/2017, 2:51 PM

## 2017-03-15 NOTE — Progress Notes (Signed)
[place] PHYSICAL MEDICINE & REHABILITATION     PROGRESS NOTE    Subjective/Complaints: No new issues. Back sore.   ROS: pt denies nausea, vomiting, diarrhea, cough, shortness of breath or chest pain   Objective: Vital Signs: Blood pressure (!) 151/57, pulse 63, temperature 98.8 F (37.1 C), temperature source Oral, resp. rate 18, height  (1.803 m), weight 101.4 kg (223 lb 9.6 oz), SpO2 96 %. Mr Thoracic Spine Wo Contrast  Result Date: 03/13/2017 CLINICAL DATA:  Right leg weakness EXAM: MRI THORACIC SPINE WITHOUT CONTRAST TECHNIQUE: Multiplanar, multisequence MR imaging of the thoracic spine was performed. No intravenous contrast was administered. COMPARISON:  Lumbar spine MRI 03/12/2017 FINDINGS: Alignment:  Normal Vertebrae: No acute compression fracture, discitis-osteomyelitis, facet edema or other focal marrow lesion. No epidural collection. Cord:  Normal caliber and signal. Paraspinal and other soft tissues: Unremarkable Disc levels: T4-T5: Mild disc herniation narrowing the ventral CSF space. No spinal canal stenosis. T5-T6: Small central disc herniation narrowing the ventral CSF space and indenting the spinal cord. No cord signal change. The other levels show no significant disc herniation, spinal canal stenosis or neural foraminal narrowing. IMPRESSION: Mild thoracic degenerative disc disease with mild spinal canal stenosis at T5-T6 where a central disc protrusion indents the ventral spinal cord without causing signal change. Electronically Signed   By: Deatra Robinson M.D.   On: 03/13/2017 15:04    Recent Labs  03/14/17 0544  WBC 8.6  HGB 13.6  HCT 40.0  PLT 327    Recent Labs  03/14/17 0544  NA 141  K 3.8  CL 103  GLUCOSE 81  BUN 10  CREATININE 1.12  CALCIUM 9.2   CBG (last 3)   Recent Labs  03/14/17 0626 03/14/17 1210 03/14/17 1703  GLUCAP 88 97 93    Wt Readings from Last 3 Encounters:  03/13/17 101.4 kg (223 lb 9.6 oz)  03/12/17 118.5 kg (261 lb  4.8 oz)  03/10/17 117.9 kg (260 lb)    Physical Exam:  Constitutional: He is oriented to person, place, and time. He appears well-developed and well-nourished.  HENT:  Head: Normocephalic and atraumatic.  Mouth/Throat: Oropharynx is clear and moist.  Eyes: Conjunctivae and EOM are normal. Pupils are equal, round, and reactive to light.  Neck: Normal range of motion. Neck supple.  Right neck keloid Cardiovascular: RRR   Respiratory: CTA B GI: soft NT/ND  Musculoskeletal: He exhibits no edema or tenderness.  Neurological: He is alert and oriented to person, place, and time.  Fair insight and awareness. A little slow to process Motor: 5/5 in bilateral deltoid, bicep, tricep, grip RLE: HF, KE 4/5, 3- to 3/5 ADF/PF LLE: 4/5 HF, KE, 1-2/5 ADF/PF Senses pain in all 4's  Skin: Skin is warm and dry. He is not diaphoretic. Sacral area intact.  Psychiatric: affect flat  Assessment/Plan: 1. Gait abnormality and functional deficits secondary to Right foot drop, lumbar stenosis, worsened by recent fall and pre-existing left foot drop which require 3+ hours per day of interdisciplinary therapy in a comprehensive inpatient rehab setting. Physiatrist is providing close team supervision and 24 hour management of active medical problems listed below. Physiatrist and rehab team continue to assess barriers to discharge/monitor patient progress toward functional and medical goals.  Function:  Bathing Bathing position   Position: Shower  Bathing parts Body parts bathed by patient: Right arm, Left arm, Chest, Abdomen, Front perineal area, Buttocks, Right upper leg, Left upper leg, Right lower leg, Left lower leg Body parts  bathed by helper: Back  Bathing assist Assist Level: Supervision or verbal cues      Upper Body Dressing/Undressing Upper body dressing   What is the patient wearing?: Pull over shirt/dress     Pull over shirt/dress - Perfomed by patient: Thread/unthread right sleeve,  Thread/unthread left sleeve, Put head through opening          Upper body assist Assist Level: Supervision or verbal cues      Lower Body Dressing/Undressing Lower body dressing   What is the patient wearing?: Underwear, Pants, Non-skid slipper socks, Shoes Underwear - Performed by patient: Thread/unthread right underwear leg, Thread/unthread left underwear leg, Pull underwear up/down   Pants- Performed by patient: Thread/unthread right pants leg, Thread/unthread left pants leg, Pull pants up/down, Fasten/unfasten pants   Non-skid slipper socks- Performed by patient: Don/doff right sock, Don/doff left sock       Shoes - Performed by patient: Don/doff right shoe, Don/doff left shoe            Lower body assist Assist for lower body dressing: Supervision or verbal cues      Toileting Toileting   Toileting steps completed by patient: Adjust clothing prior to toileting, Adjust clothing after toileting Toileting steps completed by helper: Performs perineal hygiene Toileting Assistive Devices: Grab bar or rail  Toileting assist Assist level: Touching or steadying assistance (Pt.75%)   Transfers Chair/bed transfer   Chair/bed transfer method: Stand pivot Chair/bed transfer assist level: Moderate assist (Pt 50 - 74%/lift or lower) Chair/bed transfer assistive device: Patent attorney     Max distance: 10 Assist level: Touching or steadying assistance (Pt > 75%)   Wheelchair   Type: Manual Max wheelchair distance: 150 Assist Level: Supervision or verbal cues  Cognition Comprehension Comprehension assist level: Understands basic 90% of the time/cues < 10% of the time  Expression Expression assist level: Expresses basic 90% of the time/requires cueing < 10% of the time.  Social Interaction Social Interaction assist level: Interacts appropriately 90% of the time - Needs monitoring or encouragement for participation or interaction.  Problem Solving Problem  solving assist level: Solves basic 50 - 74% of the time/requires cueing 25 - 49% of the time  Memory Memory assist level: Recognizes or recalls 75 - 89% of the time/requires cueing 10 - 24% of the time   Medical Problem List and Plan: 1.  Abnormality of gait, limitation in self-care secondary to acute right foot drop as well as right quadricep weakness, history of lumbar spinal stenosis with recent fall, superimposed on prior left foot drop.  -continue therapies 2.  DVT Prophylaxis/Anticoagulation: Pharmaceutical: Lovenox 3. Pain Management: tylenol prn 4. Mood: LCSW to follow for evaluation and support.  5. Neuropsych: This patient is not fully capable of making decisions on his own behalf. 6. Skin/Wound Care: routine pressure relief measures.  7. Fluids/Electrolytes/Nutrition: encourage po  -labs pending. 8. HTN: Monitor BP bid. Continue Cardura, Cardizem and lanoxin   -normotensive at present 9. H/o CVA: On Plavix and Crestor.  10 Urinary retention: voided yesterday. No incontinence reported  -ua negative. ucx negative   LOS (Days) 2 A FACE TO FACE EVALUATION WAS PERFORMED  Ranelle Oyster, MD 03/15/2017 9:39 AM

## 2017-03-15 NOTE — Progress Notes (Signed)
Social Work  Social Work Assessment and Plan  Patient Details  Name: Nicholas [name] MRN: [id number] Date of Birth: April 01, 1951  Today's Date: 03/15/2017  Problem List:  Patient Active Problem List   Diagnosis Date Noted  . Lumbar radiculopathy 03/13/2017  . Lumbar spinal stenosis 03/13/2017  . Foot drop, right   . Urinary retention due to benign prostatic hyperplasia   . Benign essential HTN   . History of CVA (cerebrovascular accident) 03/11/2017  . Cognitive deficits, late effect of cerebrovascular disease 07/23/2014  . 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 Aortic valve disorder 08/09/2010  . CLAUDICATION 08/09/2010  . Hyperlipidemia 08/08/2010  . TOBACCO ABUSE 08/08/2010  . Essential hypertension 08/08/2010  . CAD, NATIVE VESSEL 08/08/2010  . Occlusion and stenosis of carotid artery 08/08/2010   Past Medical History:  Past Medical History:  Diagnosis Date  . At high risk for falls   . BPH (benign prostatic hypertrophy)    HX RETENTION  . Carotid artery occlusion    S/P RIGHT CEA  . Coronary artery disease CARDIOLOGIST--  DR Johney Frame   S/P  PCI TO LAD 1993  . GERD (gastroesophageal reflux disease)   . History of CVA (cerebrovascular accident)    1993---  NO RESIDUALS  . History of head injury    CONCUSSION--  NO RESIDUAL  . History of myocardial infarction    1993--  NON-Q WAVE  S/P PCI TO LAD  . Hydrocele, left   . Hyperlipidemia   . Hypertension   . Left carotid artery stenosis    MILD  . Myocardial infarction   . PSVT (paroxysmal supraventricular tachycardia) (HCC)   . PVD (peripheral vascular disease) (HCC)    S/P LEFT FEM-POP  . Right leg weakness    USES CANE--  SECONDARY TO PVD  . Type 2 diabetes mellitus (HCC)   . Wears glasses    Past Surgical History:  Past Surgical History:  Procedure Laterality Date  . CARDIAC CATHETERIZATION  01-18-2003   DR Eden Emms   NON-OBSTRUCTIVE CAD/  PREVIOIS ANGIOPLASTY SITE WIDELY PATENT  .  CARDIOVASCULAR STRESS TEST  2007   SMALL ANTERO-APICAL SCAR/  NO ISCHEMIA  . CAROTID ENDARTERECTOMY Right 05-31-2003  . CORONARY ANGIOPLASTY  1993   PCI TO LAD  . FEMORAL-POPLITEAL BYPASS GRAFT Left 01-19-2003  . HYDROCELE EXCISION Left 10/27/2013   Procedure: HYDROCELECTOMY ADULT;  Surgeon: Lindaann Slough, MD;  Location: [facility];  Service: Urology;  Laterality: Left;  . RIGHT HYDROCELECTOMY  05-31-2003  . TRANSTHORACIC ECHOCARDIOGRAM  08-24-2010   EF 55%/  MILD AORTIC INSUFFICENCY/  MODERATE LVH   Social History:  reports that he quit smoking about 23 years ago. His smoking use included Cigarettes. He quit after 0.00 years of use. He has never used smokeless tobacco. He reports that he does not drink alcohol or use drugs.  Family / Support Systems Marital Status: Divorced Patient Roles: Parent Children: daughter, Nicholas [name] @ [phone] and her boyfriend, Nicholas [name] @ [phone];  also a son living out of state. Other Supports: brother, Nicholas [name] @ (C) [phone] Anticipated Caregiver: self and daughter intermittently Ability/Limitations of Caregiver: Family not able to provide 24/7 if this is needed Caregiver Availability: Intermittent Family Dynamics: Pt describes daughter and other family as supportive and notes they have assisted him as needed even PTA.  After CVA in 2015, pt actually stayed with his daughter for help.    Social History Preferred language: English Religion: Baptist Cultural  Background: NA Read: Yes Write: Yes Employment Status: Disabled Fish farm manager Issues: None Guardian/Conservator: None - per MD, pt is capable of making decisions on his own behalf.   Abuse/Neglect Physical Abuse: Denies Verbal Abuse: Denies Sexual Abuse: Denies Exploitation of patient/patient's resources: Denies Self-Neglect: Denies  Emotional Status Pt's affect, behavior adn adjustment status: Pt with very flat affects and offers  only short answers/ responses to questions.  He encourages me to speak with his daughter "if you need to know...".  He quickly denies any emotional distress - will monitor.   Recent Psychosocial Issues: None Pyschiatric History: None Substance Abuse History: None  Patient / Family Perceptions, Expectations & Goals Pt/Family understanding of illness & functional limitations: Pt and family with good understanding of his stenosis and current functional limitations/ need for CIR. Premorbid pt/family roles/activities: Pt was independent overall and using cane. Anticipated changes in roles/activities/participation: No change anticipated as pt with mod ind goals which is his PLOF Pt/family expectations/goals: "I just want to get home soon."  [employer]: None Premorbid Home Care/DME Agencies: Other (Comment) (pt cannot recall) Transportation available at discharge: yes  Discharge Planning Living Arrangements: Alone Support Systems: Children, Other relatives Type of Residence: Private residence Insurance Resources: [employer] Financial Resources: Restaurant manager, fast food Screen Referred: No Living Expenses: Rent Money Management: Patient Does the patient have any problems obtaining your medications?: No Home Management: pt Patient/Family Preliminary Plans: Pt plans to return home to his apt and family to assist prn Social Work Anticipated Follow Up Needs: HH/OP Expected length of stay: 10 days  Clinical Impression Very passive, quiet gentleman here for therapy due to severe lumbar stenosis.  He is A&Ox4 but presents with very flat affect and offers on short answers.  He denies any concerns about managing alone at home.  I did follow up with his daughter, Nicholas [name], who confirms she and others will continue to provide intermittent support to pt.  Anticipate relatively short LOS.  Will follow for support and d/c planning needs.  Chante Mayson 03/15/2017, 2:47 PM

## 2017-03-15 NOTE — Progress Notes (Signed)
Pt and daughter discussed DNR and pt wants to be DNR.

## 2017-03-15 NOTE — Progress Notes (Signed)
Occupational Therapy Session Note  Patient Details  Name: ZACHARY NOLE MRN: [id number] Date of Birth: 1951-01-21  Today's Date: 03/15/2017 OT Individual Time: 8295-6213 OT Individual Time Calculation (min): 58 min    Short Term Goals: Week 1:  OT Short Term Goal 1 (Week 1): STGs=LTGs secondary to estimated short LOS  Skilled Therapeutic Interventions/Progress Updates:    Pt resting in w/c upon arrival.  Pt engaged in BADL retraining including bathing at shower level and dressing with sit<>stand from w/c at sink.  Pt completed all bathing/dressing tasks at supervision level.  Pt amb with RW to enter and exit bathroom for shower.  Pt noted with shuffling gait.  Pt performed sit<>stand without assistance to complete bathing/dressing tasks.  Discussed discharge plans and equip needs.  Pt already owns a TTB.  Pt became tearful when discussing his goal to be independent again.  Pt stated he did not want to go live with his daughter again.  Focus on activity tolerance, sit<>stand, functional amb with RW, standing balance, and safety awareness to increase independence with BADLs. Pt remained in w/c with all needs within reach and chair alarm activated.   Therapy Documentation Precautions:  Precautions Precautions: Fall Restrictions Weight Bearing Restrictions: No  Pain:  Pt denied pain  See Function Navigator for Current Functional Status.   Therapy/Group: Individual Therapy  Rich Brave 03/15/2017, 9:03 AM

## 2017-03-16 ENCOUNTER — Inpatient Hospital Stay (HOSPITAL_COMMUNITY): Payer: Medicare Other | Admitting: Occupational Therapy

## 2017-03-16 DIAGNOSIS — M21372 Foot drop, left foot: Secondary | ICD-10-CM

## 2017-03-16 DIAGNOSIS — M21371 Foot drop, right foot: Secondary | ICD-10-CM

## 2017-03-16 DIAGNOSIS — R29898 Other symptoms and signs involving the musculoskeletal system: Secondary | ICD-10-CM

## 2017-03-16 MED ORDER — HYDRALAZINE HCL 10 MG PO TABS
10.0000 mg | ORAL_TABLET | Freq: Three times a day (TID) | ORAL | Status: DC
  Administered 2017-03-16 – 2017-03-21 (×14): 10 mg via ORAL
  Filled 2017-03-16 (×16): qty 1

## 2017-03-16 NOTE — Progress Notes (Signed)
[facility] PHYSICAL MEDICINE & REHABILITATION     PROGRESS NOTE    Subjective/Complaints: Pt seen laying in bed this AM.  He attempts to get up on his own because he states he needs to urinate.  He states he does not need anyone around and tell me "Don't talk to me right now".  ROS: Denies CP, SOB, N/V/D.  Objective: Vital Signs: Blood pressure (!) 135/38, pulse (!) 58, temperature 98.6 F (37 C), temperature source Oral, resp. rate 18, height  (1.803 m), weight 97.1 kg (214 lb), SpO2 100 %. No results found.  Recent Labs  03/14/17 0544  WBC 8.6  HGB 13.6  HCT 40.0  PLT 327    Recent Labs  03/14/17 0544  NA 141  K 3.8  CL 103  GLUCOSE 81  BUN 10  CREATININE 1.12  CALCIUM 9.2   CBG (last 3)   Recent Labs  03/14/17 0626 03/14/17 1210 03/14/17 1703  GLUCAP 88 97 93    Wt Readings from Last 3 Encounters:  03/16/17 97.1 kg (214 lb)  03/12/17 118.5 kg (261 lb 4.8 oz)  03/10/17 117.9 kg (260 lb)    Physical Exam:  Constitutional: He appears well-developed and well-nourished.  HENT: Normocephalic and atraumatic.  Mouth/Throat: Oropharynx is clear and moist.  Eyes: EOMI. No discharge.  Neck: Right neck keloid Cardiovascular: RRR. No JVD.   Respiratory: CTA B. Unlabored.  GI: soft NT/ND  Musculoskeletal: He exhibits no edema or tenderness.  Neurological: He is alert and oriented.  Fair insight and awareness.  A little slow to process Motor: 5/5 in bilateral deltoid, bicep, tricep, grip RLE: HF, KE 4/5, 3/5 ADF/PF LLE: 4/5 HF, KE, 2/5 ADF/PF Skin: Skin is warm and dry. He is not diaphoretic. Sacral area intact.  Psychiatric: affect flat. Impulsive  Assessment/Plan: 1. Gait abnormality and functional deficits secondary to Right foot drop, lumbar stenosis, worsened by recent fall and pre-existing left foot drop which require 3+ hours per day of interdisciplinary therapy in a comprehensive inpatient rehab setting. Physiatrist is providing close team  supervision and 24 hour management of active medical problems listed below. Physiatrist and rehab team continue to assess barriers to discharge/monitor patient progress toward functional and medical goals.  Function:  Bathing Bathing position   Position: Shower  Bathing parts Body parts bathed by patient: Right arm, Left arm, Chest, Abdomen, Front perineal area, Buttocks, Right upper leg, Left upper leg, Right lower leg, Left lower leg Body parts bathed by helper: Back  Bathing assist Assist Level: Supervision or verbal cues      Upper Body Dressing/Undressing Upper body dressing   What is the patient wearing?: Pull over shirt/dress     Pull over shirt/dress - Perfomed by patient: Thread/unthread right sleeve, Thread/unthread left sleeve, Put head through opening          Upper body assist Assist Level: Supervision or verbal cues      Lower Body Dressing/Undressing Lower body dressing   What is the patient wearing?: Underwear, Pants, Non-skid slipper socks, Shoes Underwear - Performed by patient: Thread/unthread right underwear leg, Thread/unthread left underwear leg, Pull underwear up/down   Pants- Performed by patient: Thread/unthread right pants leg, Thread/unthread left pants leg, Pull pants up/down, Fasten/unfasten pants   Non-skid slipper socks- Performed by patient: Don/doff right sock, Don/doff left sock       Shoes - Performed by patient: Don/doff right shoe, Don/doff left shoe            Lower  body assist Assist for lower body dressing: Supervision or verbal cues      Toileting Toileting   Toileting steps completed by patient: Adjust clothing prior to toileting, Adjust clothing after toileting Toileting steps completed by helper: Performs perineal hygiene Toileting Assistive Devices: Grab bar or rail  Toileting assist Assist level: Touching or steadying assistance (Pt.75%)   Transfers Chair/bed transfer   Chair/bed transfer method:  Ambulatory Chair/bed transfer assist level: Touching or steadying assistance (Pt > 75%) Chair/bed transfer assistive device: Walker, Designer, fashion/clothing     Max distance: 180 ft Assist level: Touching or steadying assistance (Pt > 75%)   Wheelchair   Type: Manual Max wheelchair distance: 200 ft Assist Level: Supervision or verbal cues  Cognition Comprehension Comprehension assist level: Understands basic 90% of the time/cues < 10% of the time  Expression Expression assist level: Expresses basic 90% of the time/requires cueing < 10% of the time.  Social Interaction Social Interaction assist level: Interacts appropriately 90% of the time - Needs monitoring or encouragement for participation or interaction.  Problem Solving Problem solving assist level: Solves basic 50 - 74% of the time/requires cueing 25 - 49% of the time  Memory Memory assist level: Recognizes or recalls 75 - 89% of the time/requires cueing 10 - 24% of the time   Medical Problem List and Plan: 1.  Abnormality of gait, limitation in self-care secondary to acute right foot drop as well as right quadricep weakness, history of lumbar spinal stenosis with recent fall, superimposed on prior left foot drop.  -continue CIR 2.  DVT Prophylaxis/Anticoagulation: Pharmaceutical: Lovenox 3. Pain Management: tylenol prn 4. Mood: LCSW to follow for evaluation and support.  5. Neuropsych: This patient is not fully capable of making decisions on his own behalf. 6. Skin/Wound Care: routine pressure relief measures.  7. Fluids/Electrolytes/Nutrition: encourage po  BMP within acceptable range 4/5 8. HTN: Monitor BP bid. Continue Cardura, Cardizem and lanoxin, metoprolol   Hydralazine 10 started 4/7 9. H/o CVA: On Plavix and Crestor.  10 Urinary retention: voided yesterday. No incontinence reported  -ua negative. ucx negative   LOS (Days) 3 A FACE TO FACE EVALUATION WAS PERFORMED  Swan [place] Karis Juba, MD 03/16/2017  4:25 PM

## 2017-03-17 ENCOUNTER — Inpatient Hospital Stay (HOSPITAL_COMMUNITY): Payer: Medicare Other | Admitting: Occupational Therapy

## 2017-03-17 ENCOUNTER — Inpatient Hospital Stay (HOSPITAL_COMMUNITY): Payer: Medicare Other | Admitting: Physical Therapy

## 2017-03-17 DIAGNOSIS — K5901 Slow transit constipation: Secondary | ICD-10-CM

## 2017-03-17 MED ORDER — POLYETHYLENE GLYCOL 3350 17 G PO PACK
17.0000 g | PACK | Freq: Every day | ORAL | Status: DC
  Administered 2017-03-17 – 2017-03-21 (×5): 17 g via ORAL
  Filled 2017-03-17 (×5): qty 1

## 2017-03-17 MED ORDER — SENNOSIDES-DOCUSATE SODIUM 8.6-50 MG PO TABS
1.0000 | ORAL_TABLET | Freq: Two times a day (BID) | ORAL | Status: DC
  Administered 2017-03-17 – 2017-03-21 (×9): 1 via ORAL
  Filled 2017-03-17 (×9): qty 1

## 2017-03-17 NOTE — Progress Notes (Signed)
Occupational Therapy Session Note  Patient Details  Name: Nicholas [name] MRN: [id number] Date of Birth: [birth year]  Today's Date: 03/17/2017 OT Individual Time: 4098-1191 and 1419-1535 OT Individual Time Calculation (min): 62 min and 76 min   Short Term Goals: Week 1:  OT Short Term Goal 1 (Week 1): STGs=LTGs secondary to estimated short LOS:     Skilled Therapeutic Interventions/Progress Updates: Skilled OT session completed with focus on functional mobility, postural control, and balance. Pt ambulated to therapy gym with RW and close supervision due to shuffling gait. Once in gym, had pt engage in wii activity with use of balance board, pt very challenged by lateral weight shift and scored 1/5 star range for 4 trials. Afterwards had him engage in additional balance activities with use of biodex. Significantly extra time for anterior weight shifting. He reported that he enjoyed this activity. Pt then ambulated back to room in manner as written above. He was left with all needs and chair alarm activated.     2nd Session 1:1 tx (76 min) Skilled OT session completed with focus on IADL mgt with preferred ambulating devices. Openly discussed d/c with pt in regards to meal prep/grocery shopping. He reports that he will not use RW at home. He plans to use his cane and rollator (and no device in bathroom). We discussed safety concerns regarding using devices with less stability, with pt still adamant about using his familiar devices or none at all. Practiced simulated meal prep with quad cane, pt retrieving items from refrigerator/cupboards and transporting pots across countertops with Min Nicholas. Pts gait did not appear to worsen with this device. Afterwards had him engage in simulated grocery shopping in dayroom with rollator. Pt required mod cues on walker safety throughout task and cues for ST memory recall. Listened to [employer] music (for psychosocial wellbeing) with pt becaming tearful "this is my  song." We called his daughter to ask if she could bring his familiar devices to CIR this next week for continued practice during therapies. She reported that she would. He then ambulated back to room with rollator and supervision-min guard. No LOBs during tx with new devices. He was left in recliner at time of departure with chair alarm activated and all needs.   Therapy Documentation Precautions:  Precautions Precautions: Fall Restrictions Weight Bearing Restrictions: No  Pain: No c/o pain during sessions Pain Assessment Pain Assessment: No/denies pain ADL:      See Function Navigator for Current Functional Status.   Therapy/Group: Individual Therapy  Nicholas [name] Nicholas [name] 03/17/2017, 12:50 PM

## 2017-03-17 NOTE — Progress Notes (Signed)
Pt.'s BP 118/38,P= 65.Doxazosin and Metoprolol were hold today.Dr. Allena Katz was informed.Keep monitoring pt. Closely.

## 2017-03-17 NOTE — Progress Notes (Signed)
[facility] PHYSICAL MEDICINE & REHABILITATION     PROGRESS NOTE    Subjective/Complaints: Pt seen sitting up at the edge of the bed this AM. He is agitated with therapies. He is agitated with his room assignment. He states he did not sleep well as result of being in the hospital and his bed and states that he does not want any medications because he does not want to sleep well. He also complains of constipation.  ROS: + Constipation. Denies CP, SOB, N/V/D.  Objective: Vital Signs: Blood pressure (!) 130/42, pulse 65, temperature 98.2 F (36.8 C), temperature source Oral, resp. rate 17, height  (1.803 m), weight 97.1 kg (214 lb), SpO2 97 %. No results found. No results for input(s): WBC, HGB, HCT, PLT in the last 72 hours. No results for input(s): NA, K, CL, GLUCOSE, BUN, CREATININE, CALCIUM in the last 72 hours.  Invalid input(s): CO CBG (last 3)   Recent Labs  03/14/17 1210 03/14/17 1703  GLUCAP 97 93    Wt Readings from Last 3 Encounters:  03/16/17 97.1 kg (214 lb)  03/12/17 118.5 kg (261 lb 4.8 oz)  03/10/17 117.9 kg (260 lb)    Physical Exam:  Constitutional: He appears well-developed and well-nourished.  HENT: Normocephalic and atraumatic.  Eyes: EOMI. No discharge.  Neck: Right neck keloid Cardiovascular: RRR. No JVD. + murmur. Respiratory: CTA B. unlabored.  GI: soft NT/ND  Musculoskeletal: He exhibits no edema or tenderness.  Neurological: He is alert and oriented.  Fair insight and awareness.  A little slow to process Motor: 5/5 in bilateral deltoid, bicep, tricep, grip RLE: HF, KE 4/5, 3/5 ADF/PF LLE: 4/5 HF, KE, 2/5 ADF/PF Skin: Skin is warm and dry. He is not diaphoretic. Sacral area intact.  Psychiatric: affect flat. Impulsive  Assessment/Plan: 1. Gait abnormality and functional deficits secondary to Right foot drop, lumbar stenosis, worsened by recent fall and pre-existing left foot drop which require 3+ hours per day of interdisciplinary  therapy in a comprehensive inpatient rehab setting. Physiatrist is providing close team supervision and 24 hour management of active medical problems listed below. Physiatrist and rehab team continue to assess barriers to discharge/monitor patient progress toward functional and medical goals.  Function:  Bathing Bathing position   Position: Shower  Bathing parts Body parts bathed by patient: Right arm, Left arm, Chest, Abdomen, Front perineal area, Buttocks, Right upper leg, Left upper leg, Right lower leg, Left lower leg Body parts bathed by helper: Back  Bathing assist Assist Level: Supervision or verbal cues      Upper Body Dressing/Undressing Upper body dressing   What is the patient wearing?: Pull over shirt/dress     Pull over shirt/dress - Perfomed by patient: Thread/unthread right sleeve, Thread/unthread left sleeve, Put head through opening          Upper body assist Assist Level: Supervision or verbal cues      Lower Body Dressing/Undressing Lower body dressing   What is the patient wearing?: Underwear, Pants, Non-skid slipper socks, Shoes Underwear - Performed by patient: Thread/unthread right underwear leg, Thread/unthread left underwear leg, Pull underwear up/down   Pants- Performed by patient: Thread/unthread right pants leg, Thread/unthread left pants leg, Pull pants up/down, Fasten/unfasten pants   Non-skid slipper socks- Performed by patient: Don/doff right sock, Don/doff left sock       Shoes - Performed by patient: Don/doff right shoe, Don/doff left shoe            Lower body assist Assist  for lower body dressing: Supervision or verbal cues      Toileting Toileting   Toileting steps completed by patient: Adjust clothing prior to toileting, Performs perineal hygiene, Adjust clothing after toileting Toileting steps completed by helper: Performs perineal hygiene Toileting Assistive Devices: Grab bar or rail  Toileting assist Assist level: Touching  or steadying assistance (Pt.75%)   Transfers Chair/bed transfer   Chair/bed transfer method: Ambulatory Chair/bed transfer assist level: Touching or steadying assistance (Pt > 75%) Chair/bed transfer assistive device: Walker, Designer, fashion/clothing     Max distance: 180 ft Assist level: Touching or steadying assistance (Pt > 75%)   Wheelchair   Type: Manual Max wheelchair distance: 200 ft Assist Level: Supervision or verbal cues  Cognition Comprehension Comprehension assist level: Understands basic 90% of the time/cues < 10% of the time  Expression Expression assist level: Expresses basic 90% of the time/requires cueing < 10% of the time.  Social Interaction Social Interaction assist level: Interacts appropriately 75 - 89% of the time - Needs redirection for appropriate language or to initiate interaction.  Problem Solving Problem solving assist level: Solves basic problems with no assist  Memory Memory assist level: Recognizes or recalls 90% of the time/requires cueing < 10% of the time   Medical Problem List and Plan: 1.  Abnormality of gait, limitation in self-care secondary to acute right foot drop as well as right quadricep weakness, history of lumbar spinal stenosis with recent fall, superimposed on prior left foot drop.  -continue CIR 2.  DVT Prophylaxis/Anticoagulation: Pharmaceutical: Lovenox 3. Pain Management: tylenol prn 4. Mood: LCSW to follow for evaluation and support.  5. Neuropsych: This patient is not fully capable of making decisions on his own behalf. 6. Skin/Wound Care: routine pressure relief measures.  7. Fluids/Electrolytes/Nutrition: encourage po  BMP within acceptable range 4/5 8. HTN: Monitor BP bid. Continue Cardura, Cardizem and lanoxin, metoprolol   Hydralazine 10 started 4/7  Improving 9. H/o CVA: On Plavix and Crestor.  10 Urinary retention: voided yesterday. No incontinence reported  -ua negative. ucx negative 11.  Constipation  Will increase bowel regimen   LOS (Days) 4 A FACE TO FACE EVALUATION WAS PERFORMED  Ankit Karis Juba, MD 03/17/2017 7:04 AM

## 2017-03-17 NOTE — Plan of Care (Signed)
Problem: RH PAIN MANAGEMENT Goal: RH STG PAIN MANAGED AT OR BELOW PT'S PAIN GOAL Outcome: Progressing Less than 3,on 1 to 10 scale   

## 2017-03-17 NOTE — Progress Notes (Addendum)
Physical Therapy Session Note  Patient Details  NamRAYMONDO GARCIALOPEZyfield MRN: [id number] Date of Birth: [birth year]  Today's Date: 03/17/2017 PT Individual Time: 0810-0905 PT Individual Time Calculation (min): 55 min   Short Term Goals: Week 1:  PT Short Term Goal 1 (Week 1): =LTG due to estimated length of stay  Skilled Therapeutic Interventions/Progress Updates:  Pt received in recliner & agreeable to tx. Pt agreeable to donning 2nd gown for therapy. Session focused on gait training, endurance training, and self care. Pt ambulated room<>gym x 2 with RW & steady assist demonstrating wide BOS, shuffling gait, and minimal heel strike and foot clearance BLE. Pt with more significant shuffled gait when turning. In gym pt utilized nu-step level 3 x 6.5 minutes with all 4 extremities with task focusing on coordination of reciprocal movements and endurance training. Pt reporting irritation with not being dressed therefore pt returned to room and donned underwear, shorts, and shirt with supervision assist for sit<>stand transfers after therapist retrieved clothing from drawers. Back in gym pt negotiated cones with task focusing on turning with increased step length and safety. Pt initially irritated with task, reporting he had been doing this "a certain way".  Pt with slightly improving step length with cuing to "step to walker wheel" with improved ability to maintain steps within base of AD. As pt ambulated back to room pt demonstrated much improved step length BLE with supervision for task. At end of session pt left sitting in recliner with all needs within reach. Notified NT of pts need for chair alarm in room.   Pt demonstrates impaired memory as he is unable to recall room number and also had difficulty following commands to turn L into room.  Therapy Documentation Precautions:  Precautions Precautions: Fall Restrictions Weight Bearing Restrictions: No  Pain: Denied c/o pain.   See Function  Navigator for Current Functional Status.   Therapy/Group: Individual Therapy  Sandi Mariscal 03/17/2017, 9:09 AM

## 2017-03-18 ENCOUNTER — Inpatient Hospital Stay (HOSPITAL_COMMUNITY): Payer: Medicare Other

## 2017-03-18 ENCOUNTER — Inpatient Hospital Stay (HOSPITAL_COMMUNITY): Payer: Medicare Other | Admitting: Physical Therapy

## 2017-03-18 LAB — URINALYSIS, ROUTINE W REFLEX MICROSCOPIC
Bilirubin Urine: NEGATIVE
Glucose, UA: 50 mg/dL — AB
Hgb urine dipstick: NEGATIVE
Ketones, ur: NEGATIVE mg/dL
NITRITE: NEGATIVE
PH: 5 (ref 5.0–8.0)
Protein, ur: NEGATIVE mg/dL
SPECIFIC GRAVITY, URINE: 1.021 (ref 1.005–1.030)

## 2017-03-18 MED ORDER — HYDROCERIN EX CREA
TOPICAL_CREAM | Freq: Two times a day (BID) | CUTANEOUS | Status: DC
  Administered 2017-03-18 – 2017-03-21 (×5): via TOPICAL
  Filled 2017-03-18: qty 113

## 2017-03-18 NOTE — Progress Notes (Signed)
Physical Therapy Session Note  Patient Details  Name: Nicholas [name] MRN: [id number] Date of Birth: January 23, 1951  Today's Date: 03/18/2017 PT Individual Time: 1025-1125 PT Individual Time Calculation (min): 60 min   Short Term Goals: Week 1:  PT Short Term Goal 1 (Week 1): =LTG due to estimated length of stay  Skilled Therapeutic Interventions/Progress Updates:    no c/o pain except from full bladder, agreeable to therapy.  Session focus on activity tolerance, NMR, balance, and ambulation.    Pt transfers throughout session with rollator with supervision.  Ambulation to and from therapy gym with rollator and supervision with min verbal cues for increased step length and upright posture.  Pt ambulates initially with festinating pattern/flexed posture but corrects well with cues.  PT instructed pt in OTAGO level B exercises except stair negotiation and figure 8 pattern.  Pt transitions sit<>stand with no UE support for increased challenge and strengthening for LEs.  Verbal cues during side stepping and retro stepping for step length, upright posture, and neutral pelvic rotation.  Nustep x10 minutes for reciprocal stepping pattern retraining and overall activity tolerance. Pt returned to room at end of session and positioned in recliner with call bell in reach and needs met.   Therapy Documentation Precautions:  Precautions Precautions: Fall Restrictions Weight Bearing Restrictions: No   See Function Navigator for Current Functional Status.   Therapy/Group: Individual Therapy  Earnest Conroy Penven-Crew 03/18/2017, 12:07 PM

## 2017-03-18 NOTE — Progress Notes (Signed)
Occupational Therapy Session Note  Patient Details  Name: Nicholas [name] MRN: [id number] Date of Birth: 07/17/1951  Today's Date: 03/18/2017 OT Individual Time: 0700-0800 OT Individual Time Calculation (min): 60 min    Short Term Goals: Week 1:  OT Short Term Goal 1 (Week 1): STGs=LTGs secondary to estimated short LOS  Skilled Therapeutic Interventions/Progress Updates:    Pt engaged in BADL retraining including bathing at shower level and dressing with sit<>stand from w/c at sink.  Pt performed all tasks at supervision level.  Pt requested to complete grooming tasks while seated at sink.  Pt continues to ambulate with shuffling gate with no LOB noted.  Pt requires more than a reasonable amount of time to complete all tasks.  Pt remained in recliner eating breakfast with all needs within reach.  Chair alarm activated.   Therapy Documentation Precautions:  Precautions Precautions: Fall Restrictions Weight Bearing Restrictions: No Pain:  Pt denied pain  See Function Navigator for Current Functional Status.   Therapy/Group: Individual Therapy  Rich Brave 03/18/2017, 8:03 AM

## 2017-03-18 NOTE — Progress Notes (Signed)
Physical Therapy Session Note  Patient Details  Name: Nicholas [name] MRN: [id number] Date of Birth: 01/04/1951  Today's Date: 03/18/2017 PT Individual Time: 0900-1015 PT Individual Time Calculation (min): 75 min   Short Term Goals: Week 1:  PT Short Term Goal 1 (Week 1): =LTG due to estimated length of stay  Skilled Therapeutic Interventions/Progress Updates: Pt received seated in recliner, denies pain but is frustrated about bed situation as it is hurting his bottom and he's been up in the recliner since 4am. Gait to gym with rollator and S; improving step length however continues to demonstrate short shuffling steps when initiating turn to sit on support surface. Performed supine <>sit and rolling on flat bed in ADL apartment with modI. Cued pt to sit on rollator seat as he would at home; pt reports he would find sturdy place to push rollator into to stabilize prior to sitting d/t rollator sliding, and pt demonstrates safely with close S. Performed nustep x10 min BUE/BLE level 5 with average 60 steps/min for aerobic endurance, reciprocal stepping pattern. Standing toe taps to 1" and 4" step with rollator>SPC with min guard; required alternating placement of cane in opposite hands to initiate LLE placement d/t impaired weight shifting and SLS balance, improved with repetition. Attempted progressive balance exercises in parallel bars including normal and narrow BOS, eyes open/closed. Pt became verbally agitated, stating he "doesn't do these kinds of things at home, I don't ever stand in one place". Educated pt on purpose of progressing balance exercises to improve balance, safety and carryover into functional mobility tasks. Allowed pt to sit and rest, continued to discuss pt goals for therapy. He reports he feels like he is close to baseline, he wants to be able to get dressed, make his bed and get around in the kitchen. Pt ambulated to ADL kitchen with rollator and S. Pt retrieved items from cabinets  to build place setting at table; S overall. Returned to room with rollator and S; remained seated on toilet with instruction to use call bell when finished; RN alerted to pt position.      Therapy Documentation Precautions:  Precautions Precautions: Fall Restrictions Weight Bearing Restrictions: No   See Function Navigator for Current Functional Status.   Therapy/Group: Individual Therapy  Vista Lawman 03/18/2017, 10:15 AM

## 2017-03-18 NOTE — Progress Notes (Signed)
[facility] PHYSICAL MEDICINE & REHABILITATION     PROGRESS NOTE    Subjective/Complaints: Generally irritable. Complaining about bed. States we're not giving him the stool softener he uses at home (which appears to be colace).   ROS: pt denies nausea, vomiting, diarrhea, cough, shortness of breath or chest pain   Objective: Vital Signs: Blood pressure (!) 155/44, pulse 65, temperature 98.9 F (37.2 C), temperature source Oral, resp. rate 18, height  (1.803 m), weight 97.1 kg (214 lb), SpO2 100 %. No results found. No results for input(s): WBC, HGB, HCT, PLT in the last 72 hours. No results for input(s): NA, K, CL, GLUCOSE, BUN, CREATININE, CALCIUM in the last 72 hours.  Invalid input(s): CO CBG (last 3)  No results for input(s): GLUCAP in the last 72 hours.  Wt Readings from Last 3 Encounters:  03/16/17 97.1 kg (214 lb)  03/12/17 118.5 kg (261 lb 4.8 oz)  03/10/17 117.9 kg (260 lb)    Physical Exam:  Constitutional: He appears well-developed and well-nourished.  HENT: Normocephalic and atraumatic.  Eyes: EOMI. No discharge.  Neck: Right neck keloid Cardiovascular: RRR. + systolic murmur. Respiratory: CTA B.  GI: soft NT/ND  Musculoskeletal: He exhibits no edema or tenderness.  Neurological: He is alert and oriented.  Fair insight and awareness.    Motor: 5/5 in bilateral deltoid, bicep, tricep, grip RLE: HF, KE 4/5, 3+/5 ADF/PF LLE: 4/5 HF, KE, 2/5 ADF/PF--unchanged Skin: Skin is warm and dry. He is not diaphoretic. Sacral area intact.  Psychiatric: irritable  Assessment/Plan: 1. Gait abnormality and functional deficits secondary to Right foot drop, lumbar stenosis, worsened by recent fall and pre-existing left foot drop which require 3+ hours per day of interdisciplinary therapy in a comprehensive inpatient rehab setting. Physiatrist is providing close team supervision and 24 hour management of active medical problems listed below. Physiatrist and rehab team  continue to assess barriers to discharge/monitor patient progress toward functional and medical goals.  Function:  Bathing Bathing position   Position: Shower  Bathing parts Body parts bathed by patient: Right arm, Left arm, Chest, Abdomen, Front perineal area, Buttocks, Right upper leg, Left upper leg, Right lower leg, Left lower leg Body parts bathed by helper: Back  Bathing assist Assist Level: Supervision or verbal cues      Upper Body Dressing/Undressing Upper body dressing   What is the patient wearing?: Pull over shirt/dress     Pull over shirt/dress - Perfomed by patient: Thread/unthread right sleeve, Thread/unthread left sleeve, Put head through opening, Pull shirt over trunk          Upper body assist Assist Level: Supervision or verbal cues      Lower Body Dressing/Undressing Lower body dressing   What is the patient wearing?: Underwear, Pants, Shoes Underwear - Performed by patient: Thread/unthread right underwear leg, Thread/unthread left underwear leg, Pull underwear up/down   Pants- Performed by patient: Thread/unthread right pants leg, Thread/unthread left pants leg, Pull pants up/down, Fasten/unfasten pants   Non-skid slipper socks- Performed by patient: Don/doff right sock, Don/doff left sock       Shoes - Performed by patient: Don/doff right shoe, Don/doff left shoe            Lower body assist Assist for lower body dressing: Supervision or verbal cues      Toileting Toileting   Toileting steps completed by patient: Adjust clothing prior to toileting, Performs perineal hygiene, Adjust clothing after toileting Toileting steps completed by helper: Performs perineal hygiene Toileting  Assistive Devices: Grab bar or rail  Toileting assist Assist level: Touching or steadying assistance (Pt.75%)   Transfers Chair/bed transfer   Chair/bed transfer method: Ambulatory Chair/bed transfer assist level: Touching or steadying assistance (Pt >  75%) Chair/bed transfer assistive device: Walker, Designer, fashion/clothing     Max distance: 150 ft Assist level: Touching or steadying assistance (Pt > 75%)   Wheelchair   Type: Manual Max wheelchair distance: 200 ft Assist Level: Supervision or verbal cues  Cognition Comprehension Comprehension assist level: Understands basic 90% of the time/cues < 10% of the time  Expression Expression assist level: Expresses complex 90% of the time/cues < 10% of the time  Social Interaction Social Interaction assist level: Interacts appropriately 90% of the time - Needs monitoring or encouragement for participation or interaction.  Problem Solving Problem solving assist level: Solves basic 90% of the time/requires cueing < 10% of the time  Memory Memory assist level: Recognizes or recalls 75 - 89% of the time/requires cueing 10 - 24% of the time   Medical Problem List and Plan: 1.  Abnormality of gait, limitation in self-care secondary to acute right foot drop as well as right quadricep weakness, history of lumbar spinal stenosis with recent fall, superimposed on prior left foot drop.  -continue CIR  -doesn't seem interested in trying a brace for his LLE 2.  DVT Prophylaxis/Anticoagulation: Pharmaceutical: Lovenox 3. Pain Management: tylenol prn 4. Mood: LCSW to follow for evaluation and support.  5. Neuropsych: This patient is not fully capable of making decisions on his own behalf. 6. Skin/Wound Care: routine pressure relief measures.  7. Fluids/Electrolytes/Nutrition: encourage po  BMP within acceptable range 4/5 8. HTN: Monitor BP bid. Continue Cardura, Cardizem and lanoxin, metoprolol   Hydralazine 10 started 4/7  Improving control overall 9. H/o CVA: On Plavix and Crestor.  10 Urinary retention: voided yesterday. No incontinence reported  -ua negative. ucx negative 11. Constipation  Bowel regimen increased. Explained to patient   LOS (Days) 5 A FACE TO FACE EVALUATION  WAS PERFORMED  Ranelle Oyster, MD 03/18/2017 8:46 AM

## 2017-03-19 ENCOUNTER — Inpatient Hospital Stay (HOSPITAL_COMMUNITY): Payer: Medicare Other | Admitting: Physical Therapy

## 2017-03-19 ENCOUNTER — Inpatient Hospital Stay (HOSPITAL_COMMUNITY): Payer: Medicare Other

## 2017-03-19 ENCOUNTER — Encounter (HOSPITAL_COMMUNITY): Payer: Medicare Other | Admitting: Psychology

## 2017-03-19 DIAGNOSIS — A499 Bacterial infection, unspecified: Secondary | ICD-10-CM

## 2017-03-19 DIAGNOSIS — M48062 Spinal stenosis, lumbar region with neurogenic claudication: Secondary | ICD-10-CM

## 2017-03-19 DIAGNOSIS — F4320 Adjustment disorder, unspecified: Secondary | ICD-10-CM

## 2017-03-19 DIAGNOSIS — N390 Urinary tract infection, site not specified: Secondary | ICD-10-CM

## 2017-03-19 MED ORDER — AMOXICILLIN 250 MG PO CAPS
250.0000 mg | ORAL_CAPSULE | Freq: Three times a day (TID) | ORAL | Status: DC
  Administered 2017-03-19 – 2017-03-20 (×4): 250 mg via ORAL
  Filled 2017-03-19 (×5): qty 1

## 2017-03-19 MED ORDER — PHENAZOPYRIDINE HCL 100 MG PO TABS
100.0000 mg | ORAL_TABLET | Freq: Three times a day (TID) | ORAL | Status: DC
  Administered 2017-03-19 – 2017-03-21 (×6): 100 mg via ORAL
  Filled 2017-03-19 (×6): qty 1

## 2017-03-19 NOTE — Progress Notes (Signed)
Occupational Therapy Session Note  Patient Details  Name: Nicholas [name] MRN: [id number] Date of Birth: [birth year]  Today's Date: 03/19/2017 OT Individual Time: 0700-0800 OT Individual Time Calculation (min): 60 min    Short Term Goals: Week 1:  OT Short Term Goal 1 (Week 1): STGs=LTGs secondary to estimated short LOS  Skilled Therapeutic Interventions/Progress Updates:    Pt engaged in BADL retraining including bathing at shower level and dressing with sit<>stand from recliner.  Pt completed all tasks including grooming while standing at sink at supervision level.  Pt amb in room with Rollator to gather supplies/clothing prior to amb into bathroom to use toilet prior to shower.  Pt continues with shuffling gait with minimal foot clearance but states that this is baseline.  Pt exhibited no unsafe behaviors. Focus on functional amb with Rollator, standing balance, safety awareness, and activity tolerance to increase independence with BADLs.   Therapy Documentation Precautions:  Precautions Precautions: Fall Restrictions Weight Bearing Restrictions: No Pain:  Pt denied pain  See Function Navigator for Current Functional Status.   Therapy/Group: Individual Therapy  Rich Brave 03/19/2017, 8:04 AM

## 2017-03-19 NOTE — Patient Care Conference (Signed)
Inpatient RehabilitationTeam Conference and Plan of Care Update Date: 03/19/2017   Time: 2:05 PM    Patient Name: Nicholas [name]      Medical Record Number: [id number]  Date of Birth: 02-17-1951 Sex: Male         Room/Bed: 4M04C/4M04C-01 Payor Info: Payor: MEDICARE / Plan: MEDICARE PART A AND B / Product Type: *No Product type* /    Admitting Diagnosis: spinal stenosis  w foot drop   Admit Date/Time:  03/13/2017  6:45 PM Admission Comments: No comment available   Primary Diagnosis:  Lumbar spinal stenosis Principal Problem: Lumbar spinal stenosis  Patient Active Problem List   Diagnosis Date Noted  . Bacterial UTI 03/19/2017  . Adjustment disorder   . Slow transit constipation   . Weakness of both lower extremities   . Bilateral foot-drop   . Lumbar radiculopathy 03/13/2017  . Lumbar spinal stenosis 03/13/2017  . Foot drop, right   . Urinary retention due to benign prostatic hyperplasia   . Benign essential HTN   . History of CVA (cerebrovascular accident) 03/11/2017  . Cognitive deficits, late effect of cerebrovascular disease 07/23/2014  . 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 Aortic valve disorder 08/09/2010  . CLAUDICATION 08/09/2010  . Hyperlipidemia 08/08/2010  . TOBACCO ABUSE 08/08/2010  . Essential hypertension 08/08/2010  . CAD, NATIVE VESSEL 08/08/2010  . Occlusion and stenosis of carotid artery 08/08/2010    Expected Discharge Date: Expected Discharge Date: 03/21/17  Team Members Present: Physician leading conference: Arley Phenix, PsyD;Dr. Faith Rogue Nurse Present: Carmie End, RN PT Present: Alyson Reedy, PT OT Present: Ardis Rowan, COTA;Jennifer Katrinka Blazing, OT SLP Present: Reuel Derby, SLP PPS Coordinator present : Tora Duck, RN, CRRN     Current Status/Progress Goal Weekly Team Focus  Medical   lumbar stenosis with radiculopathy and foot drop, probable UTI, constipation  improve activity tolerance   bladder/bowel  mgt, pain control,    Bowel/Bladder   continent of B and B. LBM 04/09; frequent urination at HS  maintain continence level; decrease frequency of urination  monitor for changes in continence level;    Swallow/Nutrition/ Hydration             ADL's   supervision overall; s/steady A for functional transfers  mod I overall; supervision for IADLs  activity tolerance, safety awareness, family education   Mobility   modI bed mobility, S to min guard transfers and gait with rollator  modI overall, S stairs and community gait  Gait training, LE strength and coordination, activity tolerance   Communication             Safety/Cognition/ Behavioral Observations            Pain   no request for pain meds  pain <3  assess for pain q shift and prn   Skin   legs and feet dry  free of skin breakdown/ changes in skin integrity  apply eucerin cream per order    Rehab Goals Patient on target to meet rehab goals: Yes *See Care Plan and progress notes for long and short-term goals.  Barriers to Discharge: prior weakness, poor habits, reluctant to change    Possible Resolutions to Barriers:  continued reinforcement and education    Discharge Planning/Teaching Needs:  Pt will d/c home alone with intermittent assistance from daughter.  Teaching needs to be determined.   Team Discussion:  Team concludes "shuffle gait" pattern is baseline and not expected to change.  Initiation poor with  transitional mobility, turning.  Possible UTI - abx begun.  On target for mod ind goals.  Revisions to Treatment Plan:  None   Continued Need for Acute Rehabilitation Level of Care: The patient requires daily medical management by a physician with specialized training in physical medicine and rehabilitation for the following conditions: Daily direction of a multidisciplinary physical rehabilitation program to ensure safe treatment while eliciting the highest outcome that is of practical value to the patient.:  Yes Daily medical management of patient stability for increased activity during participation in an intensive rehabilitation regime.: Yes Daily analysis of laboratory values and/or radiology reports with any subsequent need for medication adjustment of medical intervention for : Neurological problems;Other  Nicholas [name] 03/19/2017, 6:15 PM

## 2017-03-19 NOTE — Progress Notes (Signed)
Occupational Therapy Note  Patient Details  Name: Nicholas [name] MRN: [id number] Date of Birth: 01-Oct-1951  Today's Date: 03/19/2017 OT Individual Time: 1300-1400 OT Individual Time Calculation (min): 60 min   Pt denied pain Individual therapy  Pt initially engaged in simple home mgmt tasks in ADL apartment with focus on amb without AD while making up bed.  Pt also amb with Rollator for simple kitchen tasks.  Pt engaged in dynamic standing tasks. Pt amb to dayroom and returned to room without resting.  Focus on safety awareness, functional amb with rollator, standing balance, and activity tolerance to increase independence with BADLs.   Lavone Neri [facility] 03/19/2017, 2:07 PM

## 2017-03-19 NOTE — Progress Notes (Signed)
Physical Therapy Session Note  Patient Details  Name: Nicholas [name] MRN: [id number] Date of Birth: 19-Jul-1951  Today's Date: 03/19/2017 PT Individual Time: 0900-1000 and 1500-1530 PT Individual Time Calculation (min): 60 min and 30 min (total 90 min)   Short Term Goals: Week 1:  PT Short Term Goal 1 (Week 1): =LTG due to estimated length of stay  Skilled Therapeutic Interventions/Progress Updates: Tx 1: Pt received seated in recliner, denies pain but frustrated by urinary frequency and uncomfortable bed, otherwise agreeable to treatment. Gait to/from gym x4 trials throughout session with rollator and S; short shuffling steps when initiating gait, and when turning sharp corners, unable to correct with cues. Nustep x13 min BUE/BLE for strengthening and aerobic endurance on level 5 with average 60 steps/min. Ascent/descent 20 stairs B handrails 6" height with S and increased time, variable step-to and alternating pattern. Returned to room d/t urinary urgency; performed toilet transfer and toileting modI. Dynamic standing balance while engaged in wii bowling for UE coordination, cognitive/physical dual task while standing. S overall for balance however requires max cues for game play and repetitive instruction on use of wii remote d/t motor perseveration and impersistence. Remained seated in recliner at end of session, chair alarm intact and all needs in reach.   Tx 2: Pt received seated in recliner, denies pain and agreeable to treatment. CSW present to update pt on d/c date Thursday; pt with no DME needs and recommend HHPT follow up to continue to address gait and balance impairments. Gait to/from gym with rollator and distant S. Standing kinetron 2x3-4 min each; cues for smooth coordinated transition between LEs, and upright posture. Sit <>stand 2x10 reps no UE support and cues to reduce reliance on LEs on table. Returned to room as above; remained seated in recliner at end of session, chair alarm  intact and all needs in reach.       Therapy Documentation Precautions:  Precautions Precautions: Fall Restrictions Weight Bearing Restrictions: No  See Function Navigator for Current Functional Status.   Therapy/Group: Individual Therapy  Vista Lawman 03/19/2017, 10:49 AM

## 2017-03-19 NOTE — Progress Notes (Signed)
Social Work Patient ID: Nicholas [name], male   DOB: Mar 01, 1951, 66 y.o.   MRN: [id number]   Met with pt following team conference.  He is aware and agreeable with targeted d/c date of 4/12 and mod ind goals.  Discussed HH follow up and pt would like to use the agency he has had in the past.  Unsure of agency name - will follow up with his daughter.  Continue to follow.  Arnold Kester, LCSW

## 2017-03-19 NOTE — Consult Note (Signed)
Neuropsychological Consultation   Patient:   Nicholas [name]   DOB:   [birth year]  MR Number:  [id number]  Location:  [facility] [address] [facility] [address] [id number] [place] [address] Dept: [phone] Loc: [phone]           Date of Service:   03/19/2017  Start Time:   At 3:50 PM End Time:   4:45 PM  Provider/Observer:  Arley Phenix, Psy.D.       Clinical Neuropsychologist       Billing Code/Service: [phone] 4 Units  Chief Complaint:    The patient has been described as having issues related to frustration and irritability due to his ongoing medical issues. A neuropsychological consult was requested to assess this issue related to his coping and adapting to his current medical and physical status.  Reason for Service:  Below is the H&P from the current hospitalization intake.  HPI:   CAMILLE THAU a 66 y.o.malewith history of CAD, T2DM with neuropathy, L-CVA with gait disorder and problems with initiation (CIR in 07/2014), concussion, CAD, back injury with left foot dropwho was admitted on 03/11/17 with acute RLE weakness and urinary retention. History taken from chart review. MRI brain reviewed, showing chronic infarcts.  Per report, MRI/MRA brain done revealing no acute abnormality and stable bilateral frontal and right parietal chronic infarcts. Dr. Pearlean Brownie was consulted for input and recommended imaging of lumbar spine due to concerns of lumbar spine disease v/s peroneal neuropathy as doubted stroke/TIA as cause of symptoms. MRI lumbar spine showed congenitally narrow lumbar spine with superimposed mild to moderate disc and facet degeneration, moderate spinal stenosis L3-4 and moderate to severe neural foraminal stenosis at left L3-4 and bilaterally at L5-SI.  2 D echo with EF 60-65% with severely calcified leaflets with moderate AR. Per reports --case discussed with Dr. Franky Macho and thoracic MRI  recommended, reviewed, showing, .  PT/OT evaluation done revealing deficits in mobility and ability to carry out ADL as well as cognitive deficits affecting tasks. CIR recommended for follow up therapy.    Current Status:  The patient described his frustration and difficulties. He felt that something and really changed when his symptoms developed and initially it made him very fearful. The patient reports that he is always been someone who has had difficulties in life but that he would adapt to them by determination and likely a very rigid response style. The current symptoms and difficulties regarding lower body functioning are likely having a major impact on his ability to cope and utilizes limited set of coping resources.  Reliability of Information: Information is provided by the patient as well as review of available medical records. The patient's status was also reviewed with treatment team.  Behavioral Observation: Erin Fulling  presents as a 66 y.o.-year-old Right African American Male who appeared his stated age. his dress was Appropriate and he was Casual and his manners were Appropriate to the situation.  his participation was indicative of Appropriate and Resistant behaviors.  There were physical disabilities noted.  he displayed an appropriate level of cooperation and motivation.     Interactions:    Active Appropriate, Attentive and Resistant  while the patient was initially resistant to a lot of discussions about what was going on within about 5 minutes of time the patient began to open up and talk about what is happening with him. The interactions progressed quite well and the patient was appropriately attentive  and engaging during our hour-long interaction.  Attention:   within normal limits and attention span and concentration were age appropriate  Memory:   within normal limits; recent and remote memory intact  Visuo-spatial:  within normal limits  Speech  (Volume):  normal  Speech:   normal;   Thought Process:  Coherent and Relevant  Though Content:  WNL; not suicidal  Orientation:   person, place, situation and had some disorientation with regard to dad of the week and day of the month but was oriented to year and was very close to day of the week and month.  Judgment:   Poor  Planning:   Poor  Affect:    Blunted, Defensive and Irritable  Mood:    Dysphoric and Irritable  Insight:   Lacking  Intelligence:   low  Medical History:   Past Medical History:  Diagnosis Date  . At high risk for falls   . BPH (benign prostatic hypertrophy)    HX RETENTION  . Carotid artery occlusion    S/P RIGHT CEA  . Coronary artery disease CARDIOLOGIST--  DR Johney Frame   S/P  PCI TO LAD 1993  . GERD (gastroesophageal reflux disease)   . History of CVA (cerebrovascular accident)    1993---  NO RESIDUALS  . History of head injury    CONCUSSION--  NO RESIDUAL  . History of myocardial infarction    1993--  NON-Q WAVE  S/P PCI TO LAD  . Hydrocele, left   . Hyperlipidemia   . Hypertension   . Left carotid artery stenosis    MILD  . Myocardial infarction   . PSVT (paroxysmal supraventricular tachycardia) (HCC)   . PVD (peripheral vascular disease) (HCC)    S/P LEFT FEM-POP  . Right leg weakness    USES CANE--  SECONDARY TO PVD  . Type 2 diabetes mellitus (HCC)   . Wears glasses            Family Med/Psych History:  Family History  Problem Relation Age of Onset  . Stroke Mother   . Hypertension Mother   . Hypertension Father     Risk of Suicide/Violence: low the patient denies any suicidal or homicidal ideation.  Impression/DX:  The patient is a 66 year old man who presented after sudden onset of right-sided weakness. The patient has likely had a history of chronic infarction and microvascular ischemic events to the years.  The patient reports that he had fallen onto his buttocks off the bed in the evening and when he woke up he started  having a lot of these symptoms. There were no indications of acute stroke or cerebrovascular event. The reason for his lower leg and lower limb weakness was identified as spinal stenosis of the lumbar region. The patient acknowledges having to struggle a great bit in his life and knows that he has increasingly deteriorating medical function. However, he reports that he had been doing okay living independently prior to this happening and this is really frustrating him. He knows that the treatment team are trying to help him the best they can and is accepting the reality but is feeling overwhelmed and stressed by the loss of function and fearful that he won't be able to improve her return to his baseline.  Disposition/Plan:  If the patient requests to be seen again I'll be happy to see him again. I left that option up to the patient at the end of our visit and it was unclear whether he was desiring  to utilize further psychological interventions.  Diagnosis:    Spinal stenosis of lumbar region, unspecified whether neurogenic claudication present - Plan: Ambulatory referral to Physical Medicine Rehab         Electronically Signed   _______________________ Arley Phenix, Psy.D.

## 2017-03-19 NOTE — Progress Notes (Signed)
PHYSICAL MEDICINE & REHABILITATION     PROGRESS NOTE    Subjective/Complaints: Bowels moving. Complaining of urinary urgency with low PVR's. Pain controlled  ROS: pt denies nausea, vomiting, diarrhea, cough, shortness of breath or chest pain    Objective: Vital Signs: Blood pressure (!) 128/56, pulse 79, temperature 98.8 F (37.1 C), temperature source Oral, resp. rate 18, height  (1.803 m), weight 97.1 kg (214 lb), SpO2 95 %. No results found. No results for input(s): WBC, HGB, HCT, PLT in the last 72 hours. No results for input(s): NA, K, CL, GLUCOSE, BUN, CREATININE, CALCIUM in the last 72 hours.  Invalid input(s): CO CBG (last 3)  No results for input(s): GLUCAP in the last 72 hours.  Wt Readings from Last 3 Encounters:  03/16/17 97.1 kg (214 lb)  03/12/17 118.5 kg (261 lb 4.8 oz)  03/10/17 117.9 kg (260 lb)    Physical Exam:  Constitutional: He appears well-developed and well-nourished.  HENT: Normocephalic and atraumatic.  Eyes: EOMI. No discharge.  Neck: Right neck keloid stable Cardiovascular: RRR with murmur. Respiratory: CTA  B.  GI: soft NT/ND  Musculoskeletal: He exhibits no edema or tenderness.  Neurological: He is alert and oriented.  Fair insight and awareness.    Motor: 5/5 in bilateral deltoid, bicep, tricep, grip RLE: HF, KE 4/5, 3+/5 ADF/PF LLE: 4/5 HF, KE, 2/5 ADF/PF--unchanged Skin: Skin is warm and dry. He is not diaphoretic. Sacral area intact.  Psychiatric: irritable  Assessment/Plan: 1. Gait abnormality and functional deficits secondary to Right foot drop, lumbar stenosis, worsened by recent fall and pre-existing left foot drop which require 3+ hours per day of interdisciplinary therapy in a comprehensive inpatient rehab setting. Physiatrist is providing close team supervision and 24 hour management of active medical problems listed below. Physiatrist and rehab team continue to assess barriers to discharge/monitor patient  progress toward functional and medical goals.  Function:  Bathing Bathing position   Position: Shower  Bathing parts Body parts bathed by patient: Right arm, Left arm, Chest, Abdomen, Front perineal area, Buttocks, Right upper leg, Left upper leg, Right lower leg, Left lower leg Body parts bathed by helper: Back  Bathing assist Assist Level: Supervision or verbal cues      Upper Body Dressing/Undressing Upper body dressing   What is the patient wearing?: Pull over shirt/dress     Pull over shirt/dress - Perfomed by patient: Thread/unthread right sleeve, Thread/unthread left sleeve, Put head through opening, Pull shirt over trunk          Upper body assist Assist Level: Supervision or verbal cues      Lower Body Dressing/Undressing Lower body dressing   What is the patient wearing?: Underwear, Pants, Shoes Underwear - Performed by patient: Thread/unthread right underwear leg, Thread/unthread left underwear leg, Pull underwear up/down   Pants- Performed by patient: Thread/unthread right pants leg, Thread/unthread left pants leg, Pull pants up/down, Fasten/unfasten pants   Non-skid slipper socks- Performed by patient: Don/doff right sock, Don/doff left sock       Shoes - Performed by patient: Don/doff right shoe, Don/doff left shoe            Lower body assist Assist for lower body dressing: Supervision or verbal cues      Toileting Toileting   Toileting steps completed by patient: Adjust clothing prior to toileting, Performs perineal hygiene, Adjust clothing after toileting Toileting steps completed by helper: Performs perineal hygiene Toileting Assistive Devices: Grab bar or Customer service manager  level: Supervision or verbal cues   Transfers Chair/bed transfer   Chair/bed transfer method: Ambulatory Chair/bed transfer assist level: Supervision or verbal cues Chair/bed transfer assistive device: Armrests (rollator)     Locomotion Ambulation     Max  distance: 150 ft Assist level: Supervision or verbal cues   Wheelchair   Type: Manual Max wheelchair distance: 200 ft Assist Level: Supervision or verbal cues  Cognition Comprehension Comprehension assist level: Understands basic 90% of the time/cues < 10% of the time  Expression Expression assist level: Expresses complex 90% of the time/cues < 10% of the time  Social Interaction Social Interaction assist level: Interacts appropriately 90% of the time - Needs monitoring or encouragement for participation or interaction.  Problem Solving Problem solving assist level: Solves basic 90% of the time/requires cueing < 10% of the time  Memory Memory assist level: Recognizes or recalls 75 - 89% of the time/requires cueing 10 - 24% of the time   Medical Problem List and Plan: 1.  Abnormality of gait, limitation in self-care secondary to acute right foot drop as well as right quadricep weakness, history of lumbar spinal stenosis with recent fall, superimposed on prior left foot drop.  -continue CIR--team conference today  -doesn't seem interested in trying a brace for his LLE (?foot up brace) 2.  DVT Prophylaxis/Anticoagulation: Pharmaceutical: Lovenox 3. Pain Management: tylenol prn 4. Mood: LCSW to follow for evaluation and support.  5. Neuropsych: This patient is not fully capable of making decisions on his own behalf. 6. Skin/Wound Care: routine pressure relief measures.  7. Fluids/Electrolytes/Nutrition: encourage po  BMP within acceptable range 4/5 8. HTN: Monitor BP bid. Continue Cardura, Cardizem and lanoxin, metoprolol   Hydralazine 10 started 4/7  Improved control overall 9. H/o CVA: On Plavix and Crestor.  10 Urine retention resolved. Now with frequency. Repeat UA is suspicious for UTI  -begin empiric amoxil  -pyridium for urgency 11. Constipation  Moving bowels with adjusted regimen  LOS (Days) 6 A FACE TO FACE EVALUATION WAS PERFORMED  Ranelle Oyster, MD 03/19/2017 8:20 AM

## 2017-03-20 ENCOUNTER — Inpatient Hospital Stay (HOSPITAL_COMMUNITY): Payer: Medicare Other | Admitting: Physical Therapy

## 2017-03-20 ENCOUNTER — Inpatient Hospital Stay (HOSPITAL_COMMUNITY): Payer: Medicare Other

## 2017-03-20 LAB — BASIC METABOLIC PANEL
Anion gap: 10 (ref 5–15)
BUN: 11 mg/dL (ref 6–20)
CO2: 28 mmol/L (ref 22–32)
Calcium: 9.7 mg/dL (ref 8.9–10.3)
Chloride: 99 mmol/L — ABNORMAL LOW (ref 101–111)
Creatinine, Ser: 1.38 mg/dL — ABNORMAL HIGH (ref 0.61–1.24)
GFR calc non Af Amer: 52 mL/min — ABNORMAL LOW (ref >60)
Glucose, Bld: 195 mg/dL — ABNORMAL HIGH (ref 65–99)
POTASSIUM: 4.8 mmol/L (ref 3.5–5.1)
SODIUM: 137 mmol/L (ref 135–145)

## 2017-03-20 LAB — CBC
HCT: 41.1 % (ref 39.0–52.0)
Hemoglobin: 13.7 g/dL (ref 13.0–17.0)
MCH: 33.6 pg (ref 26.0–34.0)
MCHC: 33.3 g/dL (ref 30.0–36.0)
MCV: 100.7 fL — ABNORMAL HIGH (ref 78.0–100.0)
Platelets: 366 10*3/uL (ref 150–400)
RBC: 4.08 MIL/uL — ABNORMAL LOW (ref 4.22–5.81)
RDW: 12.3 % (ref 11.5–15.5)
WBC: 23.3 10*3/uL — ABNORMAL HIGH (ref 4.0–10.5)

## 2017-03-20 LAB — URINE CULTURE: Culture: >=100000 — AB

## 2017-03-20 MED ORDER — AMOXICILLIN 500 MG PO CAPS
500.0000 mg | ORAL_CAPSULE | Freq: Three times a day (TID) | ORAL | Status: DC
  Administered 2017-03-20 – 2017-03-21 (×4): 500 mg via ORAL
  Filled 2017-03-20 (×5): qty 1

## 2017-03-20 NOTE — Progress Notes (Signed)
Physical Therapy Session Note  Patient Details  Name: TAIGE HOUSMAN MRN: [id number] Date of Birth: 1951/01/19  Today's Date: 03/20/2017 PT Individual Time: 0932-1003 PT Individual Time Calculation (min): 31 min   Short Term Goals: Week 1:  PT Short Term Goal 1 (Week 1): =LTG due to estimated length of stay  Skilled Therapeutic Interventions/Progress Updates:    Pt in chair upon arrival, agreeable to PT session. Pt ambulating variable distances with rollator throughout session. No loss of balance noted. Cues provided to be careful of Rt foot placement and not kick rw. Sit<>stand transfers performed from variable surfaces including chair, mat table, rocking recliner. Returned to room and pt able to go from chair into bathroom and back modified independent with rollator. Pt states that he is feeling confident with going home tomorrow. Discussed with OT and agreed on making pt mod I in room. Sign posted and discussed with pt. Pt in room with all needs in reach following session.   Therapy Documentation Precautions:  Precautions Precautions: Fall Restrictions Weight Bearing Restrictions: No Pain:  Denies pain.   See Function Navigator for Current Functional Status.   Therapy/Group: Individual Therapy  Delton See, PT 03/20/2017, 1:12 PM

## 2017-03-20 NOTE — Progress Notes (Signed)
[place] PHYSICAL MEDICINE & REHABILITATION     PROGRESS NOTE    Subjective/Complaints: Bowels moving. Complaining of urinary urgency with low PVR's. Pain controlled  ROS: pt denies nausea, vomiting, diarrhea, cough, shortness of breath or chest pain    Objective: Vital Signs: Blood pressure (!) 142/46, pulse 83, temperature 99.3 F (37.4 C), temperature source Oral, resp. rate 16, height  (1.803 m), weight 98 kg (216 lb 0.8 oz), SpO2 99 %. No results found.  Recent Labs  03/20/17 0542  WBC 23.3*  HGB 13.7  HCT 41.1  PLT 366    Recent Labs  03/20/17 0542  NA 137  K 4.8  CL 99*  GLUCOSE 195*  BUN 11  CREATININE 1.38*  CALCIUM 9.7   CBG (last 3)  No results for input(s): GLUCAP in the last 72 hours.  Wt Readings from Last 3 Encounters:  03/20/17 98 kg (216 lb 0.8 oz)  03/12/17 118.5 kg (261 lb 4.8 oz)  03/10/17 117.9 kg (260 lb)    Physical Exam:  Constitutional: He appears well-developed and well-nourished.  HENT: Normocephalic and atraumatic.  Eyes: EOMI. No discharge.  Neck: Right neck keloid stable Cardiovascular: RRR with murmur. Respiratory: CTA B.  GI: soft NT/ND  Musculoskeletal: He exhibits no edema or tenderness.  Neurological: He is alert and oriented.  Fair insight and awareness.    Motor: 5/5 in bilateral deltoid, bicep, tricep, grip RLE: HF, KE 4/5, 3+/5 ADF/PF LLE: 4/5 HF, KE, 2/5 ADF/PF--unchanged Skin: Skin is intact  Psychiatric: pleasant and cooperative  Assessment/Plan: 1. Gait abnormality and functional deficits secondary to Right foot drop, lumbar stenosis, worsened by recent fall and pre-existing left foot drop which require 3+ hours per day of interdisciplinary therapy in a comprehensive inpatient rehab setting. Physiatrist is providing close team supervision and 24 hour management of active medical problems listed below. Physiatrist and rehab team continue to assess barriers to discharge/monitor patient progress  toward functional and medical goals.  Function:  Bathing Bathing position   Position: Shower  Bathing parts Body parts bathed by patient: Right arm, Left arm, Chest, Abdomen, Front perineal area, Buttocks, Right upper leg, Left upper leg, Right lower leg, Left lower leg Body parts bathed by helper: Back  Bathing assist Assist Level: More than reasonable time, Assistive device      Upper Body Dressing/Undressing Upper body dressing   What is the patient wearing?: Pull over shirt/dress     Pull over shirt/dress - Perfomed by patient: Thread/unthread right sleeve, Thread/unthread left sleeve, Put head through opening, Pull shirt over trunk          Upper body assist Assist Level: No help, No cues      Lower Body Dressing/Undressing Lower body dressing   What is the patient wearing?: Underwear, Pants, Shoes, Non-skid slipper socks Underwear - Performed by patient: Thread/unthread right underwear leg, Thread/unthread left underwear leg, Pull underwear up/down   Pants- Performed by patient: Thread/unthread right pants leg, Thread/unthread left pants leg, Pull pants up/down, Fasten/unfasten pants   Non-skid slipper socks- Performed by patient: Don/doff right sock, Don/doff left sock       Shoes - Performed by patient: Don/doff right shoe, Don/doff left shoe            Lower body assist Assist for lower body dressing: Assistive device, More than reasonable time      Toileting Toileting   Toileting steps completed by patient: Adjust clothing prior to toileting, Performs perineal hygiene, Adjust clothing after toileting  Toileting steps completed by helper: Performs perineal hygiene Toileting Assistive Devices: Grab bar or rail  Toileting assist Assist level: No help/no cues   Transfers Chair/bed transfer   Chair/bed transfer method: Ambulatory Chair/bed transfer assist level: Supervision or verbal cues Chair/bed transfer assistive device: Armrests (rollator)      Locomotion Ambulation     Max distance: 150 Assist level: Supervision or verbal cues   Wheelchair   Type: Manual Max wheelchair distance: 200 ft Assist Level: Supervision or verbal cues  Cognition Comprehension Comprehension assist level: Understands complex 90% of the time/cues 10% of the time  Expression Expression assist level: Expresses complex 90% of the time/cues < 10% of the time  Social Interaction Social Interaction assist level: Interacts appropriately 90% of the time - Needs monitoring or encouragement for participation or interaction.  Problem Solving Problem solving assist level: Solves basic 90% of the time/requires cueing < 10% of the time  Memory Memory assist level: Recognizes or recalls 90% of the time/requires cueing < 10% of the time   Medical Problem List and Plan: 1.  Abnormality of gait, limitation in self-care secondary to acute right foot drop as well as right quadricep weakness, history of lumbar spinal stenosis with recent fall, superimposed on prior left foot drop.  -continue CIR--team conference today  -doesn't seem interested in trying a brace for his LLE and has likely been compensating for awhile 2.  DVT Prophylaxis/Anticoagulation: Pharmaceutical: Lovenox 3. Pain Management: tylenol prn 4. Mood: LCSW to follow for evaluation and support.  5. Neuropsych: This patient is not fully capable of making decisions on his own behalf. 6. Skin/Wound Care: routine pressure relief measures.  7. Fluids/Electrolytes/Nutrition: encourage po  BMP within acceptable range today 8. HTN: Monitor BP bid. Continue Cardura, Cardizem and lanoxin, metoprolol   Hydralazine 10 started 4/7  Improved control overall 9. H/o CVA: On Plavix and Crestor.  10 Proteus UTI  -sens to amoxil, wbc 23k, increase to  TID  -continue pyridium for urgency 11. Constipation  Moving bowels with adjusted regimen  LOS (Days) 7 A FACE TO FACE EVALUATION WAS PERFORMED  Ranelle Oyster,  MD 03/20/2017 8:49 AM

## 2017-03-20 NOTE — Progress Notes (Signed)
Social Work Patient ID: Nicholas [name], male   DOB: 14-Dec-1950, 66 y.o.   MRN: [id number]   Message left for daughter about d/c for tomorrow.  Able to confirm from chart review that [facility] has followed pt in the past and have place HHPT referral with them.  Pt reports daughter will be the person picking him up tomorrow.  Freddi Schrager, LCSW

## 2017-03-20 NOTE — Progress Notes (Signed)
Physical Therapy Discharge Summary  Patient Details  Name: Nicholas [name] MRN: [id number] Date of Birth: Jan 16, 1951  Today's Date: 03/20/2017 PT Individual Time: 0102-7253 PT Individual Time Calculation (min): 60 min    Patient has met 12 of 12 long term goals due to improved activity tolerance, improved balance, improved postural control, increased strength, ability to compensate for deficits, functional use of  right lower extremity and left lower extremity and improved coordination.  Patient to discharge at an ambulatory level Modified Independent.   Patient's care partner is independent to provide the necessary physical and cognitive intermittent S at discharge.  Reasons goals not met: All goals met  Recommendation:  Patient will benefit from ongoing skilled PT services in home health setting to continue to advance safe functional mobility, address ongoing impairments in strength, coordination, balance, activity tolerance, and minimize fall risk.  Equipment: No equipment provided  Reasons for discharge: treatment goals met and discharge from hospital  Patient/family agrees with progress made and goals achieved: Yes  PT Discharge Precautions/Restrictions Precautions Precautions: Fall Restrictions Weight Bearing Restrictions: No Vital Signs Therapy Vitals Temp: 99.1 F (37.3 C) Temp Source: Oral Pulse Rate: 67 Resp: 17 BP: (!) 126/47 Patient Position (if appropriate): Sitting Oxygen Therapy SpO2: 99 % O2 Device: Not Delivered Pain Pain Assessment Pain Assessment: No/denies pain Vision/Perception   Wears glasses, reading only Perception WFL  Cognition Overall Cognitive Status: History of cognitive impairments - at baseline Arousal/Alertness: Awake/alert Orientation Level: Oriented X4 Awareness: Impaired Awareness Impairment: Anticipatory impairment Problem Solving: Appears intact Safety/Judgment: Appears intact Sensation Sensation Light Touch: Appears  Intact Stereognosis: Not tested Hot/Cold: Not tested Proprioception: Impaired Detail Proprioception Impaired Details: Impaired LLE Coordination Gross Motor Movements are Fluid and Coordinated: No Fine Motor Movements are Fluid and Coordinated: No Heel Shin Test: deficits LLE d/t hx of CVA and coordination deficits Motor  Motor Motor: Ataxia;Hemiplegia;Motor impersistence;Motor perseverations Motor - Discharge Observations: hemiplegia residual from previous CVA, ataxia/shuffling gait pattern  Mobility Bed Mobility Bed Mobility: Supine to Sit;Sit to Supine Supine to Sit: 6: Modified independent (Device/Increase time) Sit to Supine: 6: Modified independent (Device/Increase time) Transfers Transfers: Yes Sit to Stand: 6: Modified independent (Device/Increase time) Stand Pivot Transfers: 6: Modified independent (Device/Increase time) Locomotion  Ambulation Ambulation: Yes Ambulation/Gait Assistance: 6: Modified independent (Device/Increase time) Ambulation Distance (Feet): 200 Feet Assistive device: Rollator Gait Gait: Yes Gait Pattern: Impaired Gait Pattern: Poor foot clearance - left;Wide base of support;Shuffle;Festinating;Right foot flat;Left foot flat Gait velocity: significantly reduced Stairs / Additional Locomotion Stairs: Yes Stairs Assistance: 5: Supervision Stairs Assistance Details: Verbal cues for precautions/safety Stair Management Technique: Two rails;Alternating pattern;Forwards Number of Stairs: 12 Height of Stairs: 6 Ramp: 5: Supervision Curb: 5: Supervision Wheelchair Mobility Wheelchair Mobility: No  Trunk/Postural Assessment  Cervical Assessment Cervical Assessment: Within Functional Limits Thoracic Assessment Thoracic Assessment: Within Functional Limits Lumbar Assessment Lumbar Assessment: Within Functional Limits Postural Control Postural Control: Deficits on evaluation (delayed stepping/righting reactions)  Balance Balance Balance Assessed:  Yes Standardized Balance Assessment Standardized Balance Assessment: Berg Balance Test;Timed Up and Go Test Berg Balance Test Sit to Stand: Able to stand without using hands and stabilize independently Standing Unsupported: Able to stand safely 2 minutes Sitting with Back Unsupported but Feet Supported on Floor or Stool: Able to sit safely and securely 2 minutes Stand to Sit: Sits safely with minimal use of hands Transfers: Able to transfer safely, definite need of hands Standing Unsupported with Eyes Closed: Able to stand 10 seconds with supervision Standing Ubsupported with Feet Together:  Able to place feet together independently and stand for 1 minute with supervision From Standing, Reach Forward with Outstretched Arm: Loses balance while trying/requires external support From Standing Position, Pick up Object from Floor: Unable to try/needs assist to keep balance From Standing Position, Turn to Look Behind Over each Shoulder: Turn sideways only but maintains balance Turn 360 Degrees: Needs assistance while turning Standing Unsupported, Alternately Place Feet on Step/Stool: Needs assistance to keep from falling or unable to try Standing Unsupported, One Foot in Front: Able to take small step independently and hold 30 seconds Standing on One Leg: Unable to try or needs assist to prevent fall Total Score: 29 Timed Up and Go Test TUG: Normal TUG Normal TUG (seconds): 28 Static Sitting Balance Static Sitting - Balance Support: Feet supported;No upper extremity supported Static Sitting - Level of Assistance: 7: Independent Dynamic Sitting Balance Dynamic Sitting - Balance Support: Feet supported;During functional activity Dynamic Sitting - Level of Assistance: 7: Independent Static Standing Balance Static Standing - Balance Support: Bilateral upper extremity supported Static Standing - Level of Assistance: 6: Modified independent (Device/Increase time) Dynamic Standing Balance Dynamic  Standing - Balance Support: Bilateral upper extremity supported Dynamic Standing - Level of Assistance: 6: Modified independent (Device/Increase time) Dynamic Standing - Balance Activities: Lateral lean/weight shifting;Forward lean/weight shifting;Reaching across midline Extremity Assessment  RUE Assessment RUE Assessment: Within Functional Limits LUE Assessment LUE Assessment: Within Functional Limits RLE Assessment RLE Assessment: Within Functional Limits LLE Assessment LLE Assessment: Exceptions to [facility] LLE Strength Left Hip Flexion: 4-/5 Left Knee Flexion: 4-/5 Left Knee Extension: 4/5 Left Ankle Dorsiflexion: 2-/5 Left Ankle Plantar Flexion: 2-/5  Skilled Therapeutic Intervention: Pt received seated in recliner, denies pain and agreeable to treatment. Assessed all mobility as described above with modI overall using rollator, S for stairs, curb/ramp, and gait on uneven surface. Educated pt in continued high risk for falls based on balance assessments above, and recommendation to continue use of rollator, clear paths between rooms, adequate lighting, and exercising caution when ambulating. Reviewed LE strengthening HEP with minimal cues needed for technique. Pt with no further questions/concerns regarding d/c at this time; remained seated in recliner at end of session, all needs in reach.    See Function Navigator for Current Functional Status.  Nicholas [name] 03/20/2017, 3:40 PM

## 2017-03-20 NOTE — Progress Notes (Signed)
Occupational Therapy Discharge Summary  Patient Details  Name: Nicholas [name] MRN: [id number] Date of Birth: September 10, 1951   Patient has met 73 of 14 long term goals due to improved activity tolerance, improved balance and ability to compensate for deficits.  Pt made steady progress with BADLs and IADLs during this admission.  Pt performs all BADLs at mod I/I level and IADLs (meal prep, laundry, home mgmt tasks) at supervision level.  Pt's use his Rollator appropriately for all functional ambulation.  Pt is pleased with his progress and ready for discharge.  Family has not been present for education. Patient to discharge at overall mod I - Supervision level.  Patient's care partner is independent to provide the necessary supervision assistance at discharge.     Recommendation:  No OT follow up needed at this itme.   Equipment: Pt already owns TTB  Reasons for discharge: treatment goals met and discharge from hospital  Patient/family agrees with progress made and goals achieved: Yes  OT Discharge Vision/Perception  Vision- History Baseline Vision/History: Wears glasses Wears Glasses: Reading only Patient Visual Report: No change from baseline  Cognition Overall Cognitive Status: History of cognitive impairments - at baseline Arousal/Alertness: Awake/alert Orientation Level: Oriented X4 Awareness: Impaired Awareness Impairment: Anticipatory impairment Problem Solving: Appears intact Safety/Judgment: Appears intact Sensation Sensation Light Touch: Appears Intact Stereognosis: Not tested Hot/Cold: Appears Intact Proprioception: Impaired Detail Proprioception Impaired Details: Impaired LLE Coordination Gross Motor Movements are Fluid and Coordinated: No Fine Motor Movements are Fluid and Coordinated: No Motor  Motor Motor: Hemiplegia;Ataxia Motor - Skilled Clinical Observations: hemiplegia residual from previous CVA    Trunk/Postural Assessment  Cervical  Assessment Cervical Assessment: Within Functional Limits Thoracic Assessment Thoracic Assessment: Within Functional Limits Lumbar Assessment Lumbar Assessment: Within Functional Limits Postural Control Postural Control: Deficits on evaluation (delayed righting reactoins)  Balance Dynamic Sitting Balance Dynamic Sitting - Balance Support: Feet supported;During functional activity Dynamic Sitting - Level of Assistance: 7: Independent Extremity/Trunk Assessment RUE Assessment RUE Assessment: Within Functional Limits LUE Assessment LUE Assessment: Within Functional Limits   See Function Navigator for Current Functional Status.  Leotis Shames [facility] 03/20/2017, 7:42 AM

## 2017-03-20 NOTE — Progress Notes (Signed)
Occupational Therapy Note  Patient Details  Name: Nicholas [name] MRN: [id number] Date of Birth: 11-24-1951  Today's Date: 03/20/2017 OT Individual Time: 1300-1400 OT Individual Time Calculation (min): 60 min   Pt denied pain Individual Therapy  Pt resting in recliner upon arrival.  Pt engaged in functional amb with rollator for home mgmt tasks at mod I level.  Pt also engaged in dynamic standing balance activities to increase righting reactions to incerase safety in home setting.  Pt also engaged in simple kitchen tasks and continued with home and kitchen safety education.  Pt returned to room and remained in recliner.  Pt made mod I in room with rollator and staff notified.    Lavone Neri [facility] 03/20/2017, 2:39 PM

## 2017-03-20 NOTE — Progress Notes (Signed)
Occupational Therapy Session Note  Patient Details  Name: Nicholas [name] MRN: [id number] Date of Birth: November 14, 1951  Today's Date: 03/20/2017 OT Individual Time: 0700-0756 OT Individual Time Calculation (min): 56 min    Short Term Goals: Week 1:  OT Short Term Goal 1 (Week 1): STGs=LTGs secondary to estimated short LOS    Pt engaged in BADLs including bathing at shower level, dressing with sit<>stand from seat, and grooming tasks while standing at sink.  Pt gathered clothing and supplies prior to entering bathroom.  Pt performed all toileting tasks at mod I level prior to taking his shower.  Pt completed all tasks at mod I/I level with no unsafe behaviors noted.   Therapy Documentation Precautions:  Precautions Precautions: Fall Restrictions Weight Bearing Restrictions: No Pain:  Pt denied pain  See Function Navigator for Current Functional Status.   Therapy/Group: Individual Therapy  Rich Brave 03/20/2017, 7:59 AM

## 2017-03-21 LAB — CBC
HCT: 42.1 % (ref 39.0–52.0)
Hemoglobin: 13.9 g/dL (ref 13.0–17.0)
MCH: 33.2 pg (ref 26.0–34.0)
MCHC: 33 g/dL (ref 30.0–36.0)
MCV: 100.5 fL — AB (ref 78.0–100.0)
PLATELETS: 375 10*3/uL (ref 150–400)
RBC: 4.19 MIL/uL — AB (ref 4.22–5.81)
RDW: 12.6 % (ref 11.5–15.5)
WBC: 22.7 10*3/uL — ABNORMAL HIGH (ref 4.0–10.5)

## 2017-03-21 MED ORDER — DILTIAZEM HCL ER BEADS 360 MG PO CP24: 360.0000 mg | ORAL_CAPSULE | Freq: Every day | ORAL | 0 refills | Status: DC

## 2017-03-21 MED ORDER — METOPROLOL TARTRATE 50 MG PO TABS: 50.0000 mg | ORAL_TABLET | Freq: Two times a day (BID) | ORAL | 0 refills | Status: DC

## 2017-03-21 MED ORDER — ROSUVASTATIN CALCIUM 10 MG PO TABS: 10.0000 mg | ORAL_TABLET | Freq: Every day | ORAL | 0 refills | Status: AC

## 2017-03-21 MED ORDER — CLOPIDOGREL BISULFATE 75 MG PO TABS: 75.0000 mg | ORAL_TABLET | Freq: Every day | ORAL | 0 refills | Status: DC

## 2017-03-21 MED ORDER — DOXAZOSIN MESYLATE 2 MG PO TABS: 2.0000 mg | ORAL_TABLET | Freq: Every day | ORAL | 0 refills | Status: DC

## 2017-03-21 MED ORDER — SENNOSIDES-DOCUSATE SODIUM 8.6-50 MG PO TABS: 1.0000 | ORAL_TABLET | Freq: Two times a day (BID) | ORAL | 0 refills | Status: DC

## 2017-03-21 MED ORDER — AMOXICILLIN 500 MG PO CAPS: 500.0000 mg | ORAL_CAPSULE | Freq: Three times a day (TID) | ORAL | 0 refills | Status: DC

## 2017-03-21 MED ORDER — DIGOXIN 250 MCG PO TABS: 0.2500 mg | ORAL_TABLET | Freq: Every day | ORAL | 0 refills | Status: DC

## 2017-03-21 MED ORDER — POLYETHYLENE GLYCOL 3350 17 G PO PACK: 17.0000 g | PACK | Freq: Every day | ORAL | 0 refills | Status: DC

## 2017-03-21 MED ORDER — HYDRALAZINE HCL 10 MG PO TABS: 10.0000 mg | ORAL_TABLET | Freq: Three times a day (TID) | ORAL | 0 refills | Status: DC

## 2017-03-21 MED ORDER — DILTIAZEM HCL ER BEADS 360 MG PO CP24
360.0000 mg | ORAL_CAPSULE | Freq: Every day | ORAL | 0 refills | Status: DC
Start: 2017-03-21 — End: 2018-03-07

## 2017-03-21 NOTE — Progress Notes (Signed)
Social Work  Discharge Note  The overall goal for the admission was met for:   Discharge location: Yes - home with intermittent assistance of daughter  Length of Stay: Yes - 8 days  Discharge activity level: Yes - modified independent  Home/community participation: Yes  Services provided included: MD, RD, PT, OT, RN, TR, Pharmacy and [place]: Medicare  Follow-up services arranged: Home Health: PT via [facility] (or additional information):  Patient/Family verbalized understanding of follow-up arrangements: Yes  Individual responsible for coordination of the follow-up plan: pt  Confirmed correct DME delivered: NA - had needed DME already    Bryony Kaman

## 2017-03-21 NOTE — Discharge Summary (Signed)
Physician Discharge Summary  Patient ID: Nicholas [name] MRN: [id number] DOB/AGE: July 03, 1951 66 y.o.  Admit date: 03/13/2017 Discharge date: 03/21/2017  Discharge Diagnoses:  Principal Problem:   Lumbar spinal stenosis Active Problems:   Benign essential HTN   Weakness of both lower extremities   Bilateral foot-drop   Slow transit constipation   Bacterial UTI   Adjustment disorder   Discharged Condition: stable   Significant Diagnostic Studies: Ct Head Wo Contrast  Result Date: 03/10/2017 CLINICAL DATA:  Right-sided weakness.  Gait difficulty. EXAM: CT HEAD WITHOUT CONTRAST TECHNIQUE: Contiguous axial images were obtained from the base of the skull through the vertex without intravenous contrast. COMPARISON:  10/03/2014 FINDINGS: Brain: Chronic encephalomalacia within the right posterior parietal lobe is identified, image 22 of series 201. Remote left ACA territory infarct is unchanged from previous exam. There is prominence of the sulci and ventricles identified compatible with brain atrophy. No abnormal extra-axial fluid collection, intracranial hemorrhage or mass identified. Vascular: No hyperdense vessel or unexpected calcification. Skull: Normal. Negative for fracture or focal lesion. Sinuses/Orbits: No acute finding. Other: None. IMPRESSION: 1. No acute intracranial abnormality. 2. 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 Electronically Signed   By: Signa Kell M.D.   On: 03/10/2017 22:41   Mr Brain Wo Contrast  Result Date: 03/12/2017 CLINICAL DATA:  66 y/o M; sudden onset of right-sided weakness in the lower extremity. EXAM: MRI HEAD WITHOUT CONTRAST MRA HEAD WITHOUT CONTRAST TECHNIQUE: Multiplanar, multiecho pulse sequences of the brain and surrounding structures were obtained without intravenous contrast. Angiographic images of the head were obtained using MRA technique without contrast. COMPARISON:  MRI and MRA of the head dated 04/26/2014. 03/10/2017 CT  of the head. FINDINGS: MRI HEAD FINDINGS Brain: No abnormal diffusion restriction to suggest acute or early subacute infarction. Stable encephalomalacia in the superior bilateral frontal lobes and right parietal lobe compatible sequelae of chronic infarction. Background of mild chronic microvascular ischemic changes of white matter and brain parenchymal volume loss. There is mild hemosiderin staining of the chronic infarctions. No new susceptibility hypointensity is identified to suggest intracranial hemorrhage. Stable normal ventricle size. No extra-axial collection. No effacement of basilar cisterns. Vascular: As below. Skull and upper cervical spine: Normal marrow signal. Sinuses/Orbits: Moderate to severe diffuse paranasal sinus mucosal thickening. No abnormal signal of the mastoid air cells. Orbits are unremarkable. Other: None. 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 Patent left internal carotid artery. Anterior cerebral arteries:  Patent. Middle cerebral arteries: Patent. Anterior communicating artery: Patent. Posterior communicating arteries:  Patent. Posterior cerebral arteries:  Patent. Basilar artery:  Patent. Vertebral arteries:  Patent. No evidence of high-grade stenosis, large vessel occlusion, or aneurysm unless noted above. IMPRESSION: 1. No acute intracranial abnormality. 2. Stable bilateral frontal and right parietal chronic infarctions. 3. Moderate to severe paranasal sinus mucosal thickening. 4. 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 5. Patent circle of Willis. Otherwise no evidence for high-grade stenosis, large vessel occlusion, or aneurysm. Electronically Signed   By: Mitzi Hansen M.D.   On: 03/12/2017 00:53   Mr Thoracic Spine Wo Contrast  Result Date: 03/13/2017 CLINICAL DATA:   Right leg weakness EXAM: MRI THORACIC SPINE WITHOUT CONTRAST TECHNIQUE: Multiplanar, multisequence MR imaging of the thoracic spine was performed. No intravenous contrast was administered. COMPARISON:  Lumbar spine MRI 03/12/2017 FINDINGS: Alignment:  Normal  Vertebrae: No acute compression fracture, discitis-osteomyelitis, facet edema or other focal marrow lesion. No epidural collection. Cord:  Normal caliber and signal. Paraspinal and other soft tissues: Unremarkable Disc levels: T4-T5: Mild disc herniation narrowing the ventral CSF space. No spinal canal stenosis. T5-T6: Small central disc herniation narrowing the ventral CSF space and indenting the spinal cord. No cord signal change. The other levels show no significant disc herniation, spinal canal stenosis or neural foraminal narrowing. IMPRESSION: Mild thoracic degenerative disc disease with mild spinal canal stenosis at T5-T6 where a central disc protrusion indents the ventral spinal cord without causing signal change. Electronically Signed   By: Deatra Robinson M.D.   On: 03/13/2017 15:04   Mr Lumbar Spine Wo Contrast  Result Date: 03/12/2017 CLINICAL DATA:  Right foot drop.  Urinary retention. EXAM: MRI LUMBAR SPINE WITHOUT CONTRAST TECHNIQUE: Multiplanar, multisequence MR imaging of the lumbar spine was performed. No intravenous contrast was administered. COMPARISON:  CT abdomen and pelvis 10/09/2015 FINDINGS: Segmentation:  Standard. Alignment:  Normal. Vertebrae: No evidence of fracture, suspicious osseous lesion, or significant marrow edema. Mild degenerative endplate marrow changes at L5-S1, predominantly type 2. Conus medullaris: Extends to the upper L2 level and appears normal. Fatty filum. Paraspinal and other soft tissues: Unremarkable. Disc levels: Disc desiccation from L2-3 to L5-S1 without significant disc space height loss. Diffuse narrowing of the lumbar spinal canal on a congenital basis due to short pedicles. T12-L1 and L1-2:  Negative.  L2-3: Mild disc bulging and slight facet hypertrophy result in mild bilateral neural foraminal stenosis without significant spinal stenosis. L3-4: Circumferential disc bulging, congenitally short pedicles, and moderate facet and ligamentum flavum hypertrophy result in moderate spinal stenosis, left greater than right lateral recess stenosis, and mild-to-moderate right and moderate to severe left neural foraminal stenosis. L4-5: Circumferential disc bulging, congenitally short pedicles, and moderate facet and ligamentum flavum hypertrophy result in mild spinal stenosis, moderate left greater than right lateral recess stenosis, and mild-to-moderate bilateral neural foraminal stenosis. Bilateral paracentral annular fissures. L5-S1: Circumferential disc bulging, superimposed small central disc protrusion, and mild facet and ligamentum flavum hypertrophy result in mild bilateral lateral recess stenosis and moderate to severe bilateral neural foraminal stenosis without significant spinal stenosis. IMPRESSION: 1. Congenitally narrow lumbar spinal canal with superimposed mild-to-moderate disc and facet degeneration. 2. Moderate spinal stenosis at L3-4. 3. Moderate lateral recess stenosis at L4-5. 4. Moderate to severe neural foraminal stenosis on the left at L3-4 and bilaterally at L5-S1. Electronically Signed   By: Sebastian Ache M.D.   On: 03/12/2017 11:42   Dg Knee Complete 4 Views Right  Result Date: 03/10/2017 CLINICAL DATA:  Fall. EXAM: RIGHT KNEE - COMPLETE 4+ VIEW COMPARISON:  None. FINDINGS: There is sharpening of the tibial spines. 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 No acute fractures or subluxations identified. IMPRESSION: 1. Osteoarthritis. 2. No acute findings. Electronically Signed   By: Signa Kell M.D.   On: 03/10/2017 23:05   Mr Maxine Glenn Headm  Result Date: 03/12/2017 CLINICAL DATA:  66 y/o M; sudden onset of right-sided weakness in the lower extremity. EXAM: MRI HEAD WITHOUT CONTRAST  MRA HEAD WITHOUT CONTRAST TECHNIQUE: Multiplanar, multiecho pulse sequences of the brain and surrounding structures were obtained without intravenous contrast. Angiographic images of the head were obtained using MRA technique without contrast. COMPARISON:  MRI and MRA of the head dated 04/26/2014. 03/10/2017 CT of the head. FINDINGS: MRI HEAD FINDINGS Brain: No abnormal diffusion restriction to suggest acute or early subacute infarction. Stable encephalomalacia in the superior  bilateral frontal lobes and right parietal lobe compatible sequelae of chronic infarction. Background of mild chronic microvascular ischemic changes of white matter and brain parenchymal volume loss. There is mild hemosiderin staining of the chronic infarctions. No new susceptibility hypointensity is identified to suggest intracranial hemorrhage. Stable normal ventricle size. No extra-axial collection. No effacement of basilar cisterns. Vascular: As below. Skull and upper cervical spine: Normal marrow signal. Sinuses/Orbits: Moderate to severe diffuse paranasal sinus mucosal thickening. No abnormal signal of the mastoid air cells. Orbits are unremarkable. Other: None. 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 Patent left internal carotid artery. Anterior cerebral arteries:  Patent. Middle cerebral arteries: Patent. Anterior communicating artery: Patent. Posterior communicating arteries:  Patent. Posterior cerebral arteries:  Patent. Basilar artery:  Patent. Vertebral arteries:  Patent. No evidence of high-grade stenosis, large vessel occlusion, or aneurysm unless noted above. IMPRESSION: 1. No acute intracranial abnormality. 2. Stable bilateral frontal and right parietal chronic infarctions. 3. Moderate to severe paranasal sinus mucosal thickening. 4. 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 5. Patent circle of Willis. Otherwise no evidence for high-grade stenosis, large vessel occlusion, or aneurysm. Electronically Signed   By: Mitzi Hansen M.D.   On: 03/12/2017 00:53    Labs:  Basic Metabolic Panel:  Recent Labs Lab 03/20/17 0542  NA 137  K 4.8  CL 99*  CO2 28  GLUCOSE 195*  BUN 11  CREATININE 1.38*  CALCIUM 9.7    CBC:  Recent Labs Lab 03/20/17 0542 03/21/17 0844  WBC 23.3* 22.7*  HGB 13.7 13.9  HCT 41.1 42.1  MCV 100.7* 100.5*  PLT 366 375    CBG: No results for input(s): GLUCAP in the last 168 hours.   Brief HPI:   Nicholas [name] a 66 y.o.malewith history of CAD, T2DM with neuropathy, L-CVA with gait disorder and problems with initiation (CIR in 07/2014), concussion, CAD, back injury with left foot dropwho was admitted on 03/11/17 with acute RLE weakness and urinary retention.  MRI brain reviewed, showing chronic infarcts.   Dr. Pearlean Brownie recommended imaging of lumbar spine due to concerns of lumbar spine disease v/s peroneal neuropathy and MRI lumbar spine showed congenitally narrow lumbar spine with superimposed mild to moderate disc and facet degeneration, moderate spinal stenosis L3-4 and moderate to severe neural foraminal stenosis at left L3-4 and bilaterally at L5-SI. Thoracic MRI negative.  Per reports --case discussed with Dr. Franky Macho and no  Surgical intervention recommended.  PT/OT evaluation done revealing deficits in mobility and ability to carry out ADL as well as cognitive deficits affecting tasks. CIR recommended for follow up   Hospital Course: Dennison Mcdaid Segoviano was admitted to rehab 03/13/2017 for inpatient therapies to consist of PT and OT at least three hours five days a week. Past admission physiatrist, therapy team and rehab RN have worked together to provide customized collaborative inpatient rehab. His blood pressures were monitored on bid basis and hydralazine was added to help with  better control. Urinary retention has resolved but he had had issues with frequency. Pyridium was added for urgency and UA was noted to be positive with reactive leucocytosis. He was started on amoxicillin for  E coli UTI with improvement in WBC. Dose was increased to 500 mg tid prior to discharge and he is to complete 7 day course antibiotic regimen. Constipation has resolved with  augmentation of bowel regimen. He has made steady progress and is modified independent at discharge. He will continue to receive follow up HHPT by    Rehab course: During patient's stay in rehab weekly team conferences were held to monitor patient's progress, set goals and discuss barriers to discharge. At admission, patient required min assist with mobility and self care tasks. He has had improvement in activity tolerance, balance, postural control, as well as ability to compensate for deficits. He is able to complete ADL tasks as well as home management tasks at modified independent level. He is modified independent for transfers and to ambulate 200' with RW.     Disposition: 01-Home or Self Care  Diet: Regular.   Special Instructions: 1. Needs CBC rechecked in 1-2 weeks.    Discharge Instructions    Ambulatory referral to Physical Medicine Rehab    Complete by:  As directed    1-2 weeks transitional care appt      Allergies as of 03/21/2017   No Known Allergies     Medication List    STOP taking these medications   dicyclomine 20 MG tablet Commonly known as:  BENTYL     TAKE these medications   amoxicillin 500 MG capsule Commonly known as:  AMOXIL Take 1 capsule (500 mg total) by mouth every 8 (eight) hours.   clopidogrel 75 MG tablet Commonly known as:  PLAVIX Take 1 tablet (75 mg total) by mouth daily with breakfast.   digoxin 0.25 MG tablet Commonly known as:  LANOXIN Take 1 tablet (0.25 mg total) by mouth daily.   diltiazem 360 MG 24 hr capsule Commonly known as:  TAZTIA XT Take 1  capsule (360 mg total) by mouth daily.   docusate sodium 50 MG capsule Commonly known as:  COLACE Take 50 mg by mouth at bedtime.   doxazosin 2 MG tablet Commonly known as:  CARDURA Take 1 tablet (2 mg total) by mouth daily.   hydrALAZINE 10 MG tablet Commonly known as:  APRESOLINE Take 1 tablet (10 mg total) by mouth every 8 (eight) hours.   metoprolol 50 MG tablet Commonly known as:  LOPRESSOR Take 1 tablet (50 mg total) by mouth 2 (two) times daily.   polyethylene glycol packet Commonly known as:  MIRALAX / GLYCOLAX Take 17 g by mouth daily. Start taking on:  03/22/2017   rosuvastatin 10 MG tablet Commonly known as:  CRESTOR Take 1 tablet (10 mg total) by mouth daily.   senna-docusate 8.6-50 MG tablet Commonly known as:  Senokot-S Take 1 tablet by mouth 2 (two) times daily.      Follow-up Information    Ranelle Oyster, MD Follow up.   Specialty:  Physical Medicine and Rehabilitation Why:  office will call you with follow up appointment Contact information: [address] [place] [address] [phone]        Carmela Hurt, MD. Call in 1 day(s).   Specialty:  Neurosurgery Why:  for follow up on Lumbar stenosis.  Contact information: [address] [place] [address] [phone]        Karie Chimera, MD Follow up on 03/28/2017.   Specialty:  Family Medicine Why:  @ 2:30 pm (hospital follow up appt) Contact information: [address] [place] [address] [phone]           Signed: Jacquelynn Cree 03/21/2017, 7:01 PM

## 2017-03-21 NOTE — Discharge Instructions (Signed)
Inpatient Rehab Discharge Instructions  Nicholas [name] Discharge date and time:    Activities/Precautions/ Functional Status: Activity: no lifting, driving, or strenuous exercise for till cleared by MD Diet: cardiac diet Wound Care: none needed   Functional status:  ___ No restrictions     ___ Walk up steps independently ___ 24/7 supervision/assistance   ___ Walk up steps with assistance ___ Intermittent supervision/assistance  ___ Bathe/dress independently ___ Walk with walker     ___ Bathe/dress with assistance ___ Walk Independently    ___ Shower independently ___ Walk with assistance    ___ Shower with assistance _X__ No alcohol     ___ Return to work/school ________    COMMUNITY REFERRALS UPON DISCHARGE:    Home Health:   PT                      Agency:  [facility] Phone: [phone]     Special Instructions:    My questions have been answered and I understand these instructions. I will adhere to these goals and the provided educational materials after my discharge from the hospital.  Patient/Caregiver Signature _______________________________ Date __________  Clinician Signature _______________________________________ Date __________  Please bring this form and your medication list with you to all your follow-up doctor's appointments.

## 2017-03-21 NOTE — Progress Notes (Signed)
Discharged to home accompanied by dtr. Pam L:ove reviewed discharge instructions with pt earlier and reviewed meds/timing with dtr for clarification. Discharged to car via wheelchair. Pamelia Hoit

## 2017-03-21 NOTE — Progress Notes (Signed)
[facility]     PROGRESS NOTE    Subjective/Complaints: No new complaints. Feels well. Bladder acting more normally  ROS: pt denies nausea, vomiting, diarrhea, cough, shortness of breath or chest pain     Objective: Vital Signs: Blood pressure (!) 134/48, pulse 72, temperature 98.5 F (36.9 C), temperature source Oral, resp. rate 18, height  (1.803 m), weight 98 kg (216 lb 0.8 oz), SpO2 99 %. No results found.  Recent Labs  03/20/17 0542  WBC 23.3*  HGB 13.7  HCT 41.1  PLT 366    Recent Labs  03/20/17 0542  NA 137  K 4.8  CL 99*  GLUCOSE 195*  BUN 11  CREATININE 1.38*  CALCIUM 9.7   CBG (last 3)  No results for input(s): GLUCAP in the last 72 hours.  Wt Readings from Last 3 Encounters:  03/20/17 98 kg (216 lb 0.8 oz)  03/12/17 118.5 kg (261 lb 4.8 oz)  03/10/17 117.9 kg (260 lb)    Physical Exam:  Constitutional: He appears well-developed and well-nourished.  HENT: Normocephalic and atraumatic.  Eyes: EOMI. No discharge.  Neck: keloid Cardiovascular:RRR with murmur. Respiratory: CTA B GI: soft NT/ND  Musculoskeletal: He exhibits no edema or tenderness.  Neurological: He is alert and oriented.  Fair insight and awareness.  Good sitting balance Motor: 5/5 in bilateral deltoid, bicep, tricep, grip RLE: HF, KE 4/5, 4-/5 ADF/PF LLE: 4/5 HF, KE, 2/5 ADF/PF--unchanged Skin: Skin is intact  Psychiatric: pleasant and cooperative  Assessment/Plan: 1. Gait abnormality and functional deficits secondary to Right foot drop, lumbar stenosis, worsened by recent fall and pre-existing left foot drop which require 3+ hours per day of interdisciplinary therapy in a comprehensive inpatient rehab setting. Physiatrist is providing close team supervision and 24 hour management of active medical problems listed below. Physiatrist and rehab team continue to assess barriers to discharge/monitor patient progress toward functional and  medical goals.  Function:  Bathing Bathing position   Position: Shower  Bathing parts Body parts bathed by patient: Right arm, Left arm, Chest, Abdomen, Front perineal area, Buttocks, Right upper leg, Left upper leg, Right lower leg, Left lower leg Body parts bathed by helper: Back  Bathing assist Assist Level: More than reasonable time, Assistive device      Upper Body Dressing/Undressing Upper body dressing   What is the patient wearing?: Pull over shirt/dress     Pull over shirt/dress - Perfomed by patient: Thread/unthread right sleeve, Thread/unthread left sleeve, Put head through opening, Pull shirt over trunk          Upper body assist Assist Level: No help, No cues      Lower Body Dressing/Undressing Lower body dressing   What is the patient wearing?: Underwear, Pants, Shoes, Non-skid slipper socks Underwear - Performed by patient: Thread/unthread right underwear leg, Thread/unthread left underwear leg, Pull underwear up/down   Pants- Performed by patient: Thread/unthread right pants leg, Thread/unthread left pants leg, Pull pants up/down, Fasten/unfasten pants   Non-skid slipper socks- Performed by patient: Don/doff right sock, Don/doff left sock       Shoes - Performed by patient: Don/doff right shoe, Don/doff left shoe            Lower body assist Assist for lower body dressing: Assistive device, More than reasonable time      Toileting Toileting   Toileting steps completed by patient: Adjust clothing prior to toileting, Performs perineal hygiene, Adjust clothing after toileting Toileting steps completed by helper:  Performs perineal hygiene Toileting Assistive Devices: Grab bar or rail  Toileting assist Assist level: No help/no cues   Transfers Chair/bed transfer   Chair/bed transfer method: Ambulatory Chair/bed transfer assist level: No Help, no cues, assistive device, takes more than a reasonable amount of time Chair/bed transfer assistive device:  Armrests, Patent attorney     Max distance: 200 ft Assist level: No help, No cues, assistive device, takes more than a reasonable amount of time   Wheelchair   Type: Manual Max wheelchair distance: 200 ft Assist Level: Supervision or verbal cues  Cognition Comprehension Comprehension assist level: Understands complex 90% of the time/cues 10% of the time  Expression Expression assist level: Expresses complex 90% of the time/cues < 10% of the time  Social Interaction Social Interaction assist level: Interacts appropriately 90% of the time - Needs monitoring or encouragement for participation or interaction.  Problem Solving Problem solving assist level: Solves basic 90% of the time/requires cueing < 10% of the time  Memory Memory assist level: Recognizes or recalls 90% of the time/requires cueing < 10% of the time   Medical Problem List and Plan: 1.  Abnormality of gait, limitation in self-care secondary to acute right foot drop as well as right quadricep weakness, history of lumbar spinal stenosis with recent fall, superimposed on prior left foot drop.  -dc home  Patient to see MD in the office for transitional care encounter in 1-2 weeks. 2.  DVT Prophylaxis/Anticoagulation: Pharmaceutical: Lovenox 3. Pain Management: tylenol prn 4. Mood: LCSW to follow for evaluation and support.  5. Neuropsych: This patient is not fully capable of making decisions on his own behalf. 6. Skin/Wound Care: routine pressure relief measures.  7. Fluids/Electrolytes/Nutrition: encourage po  BMP within acceptable range  8. HTN: Monitor BP bid. Continue Cardura, Cardizem and lanoxin, metoprolol   Hydralazine 10 started 4/7  Improved control overall 9. H/o CVA: On Plavix and Crestor.  10 Proteus UTI  -sens to amoxil, wbc 23k, increased to  TID--continue for 7 days total  -recheck cbc prior to d/c today  -continue pyridium for urgency 11. Constipation  Moving bowels with adjusted  regimen  LOS (Days) 8 A FACE TO FACE EVALUATION WAS PERFORMED  Ranelle Oyster, MD 03/21/2017 8:28 AM

## 2017-03-22 ENCOUNTER — Telehealth: Payer: Self-pay | Admitting: *Deleted

## 2017-03-22 NOTE — Telephone Encounter (Signed)
Transitional Care Call Completed, Appointment confirmed, However, Patient may not be able to come to established appointment due to transportation issues.  Daughter will callTransitional Care Questions  Questions for our staff to ask patients on Transitional care 48 hour phone call:   1. Are you/is patient experiencing any problems since coming home? No Are there any questions regarding any aspect of care? No  2. Are there any questions regarding medications administration/dosing? No Are meds being taken as prescribed? Yes Patient should review meds with caller to confirm   3. Have there been any falls? No  4. Has Home Health been to the house and/or have they contacted you? No, [facility] has contacted If not, have you tried to contact them? Can we help you contact them?   5. Are bowels and bladder emptying properly? Yes  Are there any unexpected incontinence issues? No If applicable, is patient following bowel/bladder programs?  6. Any fevers, problems with breathing, unexpected pain? No, No, No   7. Are there any skin problems or new areas of breakdown? No  8. Has the patient/family member arranged specialty MD follow up (ie cardiology/neurology/renal/surgical/etc)?  in process Can we help arrange? No   9. Does the patient need any other services or support that we can help arrange? No  10. Are caregivers following through as expected in assisting the patient? Patient is reasonably self-sufficient   11. Has the patient quit smoking, drinking alcohol, or using drugs as recommended? N/A

## 2017-03-25 ENCOUNTER — Encounter: Payer: Medicare Other | Attending: Physical Medicine & Rehabilitation | Admitting: Physical Medicine & Rehabilitation

## 2017-10-04 ENCOUNTER — Other Ambulatory Visit: Payer: Self-pay | Admitting: Family Medicine

## 2017-10-04 DIAGNOSIS — R634 Abnormal weight loss: Secondary | ICD-10-CM

## 2017-10-04 DIAGNOSIS — R1084 Generalized abdominal pain: Secondary | ICD-10-CM

## 2017-10-07 ENCOUNTER — Other Ambulatory Visit: Payer: Self-pay | Admitting: Family Medicine

## 2017-10-07 DIAGNOSIS — R634 Abnormal weight loss: Secondary | ICD-10-CM

## 2017-10-07 DIAGNOSIS — Z87891 Personal history of nicotine dependence: Secondary | ICD-10-CM

## 2017-10-14 ENCOUNTER — Other Ambulatory Visit: Payer: Medicare Other

## 2017-10-17 ENCOUNTER — Ambulatory Visit
Admission: RE | Admit: 2017-10-17 | Discharge: 2017-10-17 | Disposition: A | Discharge status: Home / Self Care | Payer: Medicare Other | Source: Ambulatory Visit | Attending: Family Medicine | Admitting: Family Medicine

## 2017-10-17 ENCOUNTER — Inpatient Hospital Stay (HOSPITAL_COMMUNITY)
Admission: EM | Admit: 2017-10-17 | Discharge: 2017-10-28 | DRG: 354 | Disposition: A | Discharge status: Home / Self Care | Payer: Medicare Other | Attending: Internal Medicine | Admitting: Internal Medicine

## 2017-10-17 ENCOUNTER — Emergency Department (HOSPITAL_COMMUNITY): Payer: Medicare Other

## 2017-10-17 ENCOUNTER — Encounter (HOSPITAL_COMMUNITY): Payer: Self-pay

## 2017-10-17 DIAGNOSIS — I471 Supraventricular tachycardia: Secondary | ICD-10-CM | POA: Diagnosis not present

## 2017-10-17 DIAGNOSIS — I080 Rheumatic disorders of both mitral and aortic valves: Secondary | ICD-10-CM | POA: Diagnosis present

## 2017-10-17 DIAGNOSIS — M5416 Radiculopathy, lumbar region: Secondary | ICD-10-CM | POA: Diagnosis present

## 2017-10-17 DIAGNOSIS — I10 Essential (primary) hypertension: Secondary | ICD-10-CM | POA: Diagnosis present

## 2017-10-17 DIAGNOSIS — I493 Ventricular premature depolarization: Secondary | ICD-10-CM | POA: Diagnosis present

## 2017-10-17 DIAGNOSIS — K5903 Drug induced constipation: Secondary | ICD-10-CM | POA: Diagnosis present

## 2017-10-17 DIAGNOSIS — R1084 Generalized abdominal pain: Secondary | ICD-10-CM

## 2017-10-17 DIAGNOSIS — Z7902 Long term (current) use of antithrombotics/antiplatelets: Secondary | ICD-10-CM

## 2017-10-17 DIAGNOSIS — K5900 Constipation, unspecified: Secondary | ICD-10-CM

## 2017-10-17 DIAGNOSIS — E46 Unspecified protein-calorie malnutrition: Secondary | ICD-10-CM | POA: Diagnosis present

## 2017-10-17 DIAGNOSIS — E1151 Type 2 diabetes mellitus with diabetic peripheral angiopathy without gangrene: Secondary | ICD-10-CM | POA: Diagnosis present

## 2017-10-17 DIAGNOSIS — R338 Other retention of urine: Secondary | ICD-10-CM | POA: Diagnosis present

## 2017-10-17 DIAGNOSIS — I6522 Occlusion and stenosis of left carotid artery: Secondary | ICD-10-CM | POA: Diagnosis present

## 2017-10-17 DIAGNOSIS — I252 Old myocardial infarction: Secondary | ICD-10-CM

## 2017-10-17 DIAGNOSIS — N401 Enlarged prostate with lower urinary tract symptoms: Secondary | ICD-10-CM | POA: Diagnosis present

## 2017-10-17 DIAGNOSIS — Z8673 Personal history of transient ischemic attack (TIA), and cerebral infarction without residual deficits: Secondary | ICD-10-CM

## 2017-10-17 DIAGNOSIS — R5381 Other malaise: Secondary | ICD-10-CM | POA: Diagnosis present

## 2017-10-17 DIAGNOSIS — K420 Umbilical hernia with obstruction, without gangrene: Principal | ICD-10-CM | POA: Diagnosis present

## 2017-10-17 DIAGNOSIS — Z6826 Body mass index (BMI) 26.0-26.9, adult: Secondary | ICD-10-CM

## 2017-10-17 DIAGNOSIS — T402X5A Adverse effect of other opioids, initial encounter: Secondary | ICD-10-CM | POA: Diagnosis present

## 2017-10-17 DIAGNOSIS — R0681 Apnea, not elsewhere classified: Secondary | ICD-10-CM | POA: Diagnosis present

## 2017-10-17 DIAGNOSIS — Z79899 Other long term (current) drug therapy: Secondary | ICD-10-CM

## 2017-10-17 DIAGNOSIS — N179 Acute kidney failure, unspecified: Secondary | ICD-10-CM | POA: Diagnosis present

## 2017-10-17 DIAGNOSIS — Z7984 Long term (current) use of oral hypoglycemic drugs: Secondary | ICD-10-CM

## 2017-10-17 DIAGNOSIS — K56609 Unspecified intestinal obstruction, unspecified as to partial versus complete obstruction: Secondary | ICD-10-CM | POA: Diagnosis present

## 2017-10-17 DIAGNOSIS — F17210 Nicotine dependence, cigarettes, uncomplicated: Secondary | ICD-10-CM | POA: Diagnosis present

## 2017-10-17 DIAGNOSIS — R634 Abnormal weight loss: Secondary | ICD-10-CM

## 2017-10-17 DIAGNOSIS — Z955 Presence of coronary angioplasty implant and graft: Secondary | ICD-10-CM

## 2017-10-17 DIAGNOSIS — L8990 Pressure ulcer of unspecified site, unspecified stage: Secondary | ICD-10-CM

## 2017-10-17 DIAGNOSIS — Z01818 Encounter for other preprocedural examination: Secondary | ICD-10-CM

## 2017-10-17 DIAGNOSIS — I2510 Atherosclerotic heart disease of native coronary artery without angina pectoris: Secondary | ICD-10-CM | POA: Diagnosis present

## 2017-10-17 DIAGNOSIS — E785 Hyperlipidemia, unspecified: Secondary | ICD-10-CM | POA: Diagnosis present

## 2017-10-17 HISTORY — DX: Unspecified intestinal obstruction, unspecified as to partial versus complete obstruction: K56.609

## 2017-10-17 LAB — COMPREHENSIVE METABOLIC PANEL
ALBUMIN: 4.6 g/dL (ref 3.5–5.0)
ALK PHOS: 99 U/L (ref 38–126)
ALT: 33 U/L (ref 17–63)
ANION GAP: 11 (ref 5–15)
AST: 32 U/L (ref 15–41)
BUN: 14 mg/dL (ref 6–20)
CO2: 29 mmol/L (ref 22–32)
Calcium: 10.1 mg/dL (ref 8.9–10.3)
Chloride: 96 mmol/L — ABNORMAL LOW (ref 101–111)
Creatinine, Ser: 1.43 mg/dL — ABNORMAL HIGH (ref 0.61–1.24)
GFR calc Af Amer: 57 mL/min — ABNORMAL LOW (ref >60)
GFR calc non Af Amer: 50 mL/min — ABNORMAL LOW (ref >60)
GLUCOSE: 130 mg/dL — AB (ref 65–99)
POTASSIUM: 4 mmol/L (ref 3.5–5.1)
SODIUM: 136 mmol/L (ref 135–145)
Total Bilirubin: 0.9 mg/dL (ref 0.3–1.2)
Total Protein: 8 g/dL (ref 6.5–8.1)

## 2017-10-17 LAB — PROTIME-INR
INR: 1.13
PROTHROMBIN TIME: 14.4 s (ref 11.4–15.2)

## 2017-10-17 LAB — CBC
HEMATOCRIT: 42.8 % (ref 39.0–52.0)
HEMOGLOBIN: 14.9 g/dL (ref 13.0–17.0)
MCH: 34.3 pg — AB (ref 26.0–34.0)
MCHC: 34.8 g/dL (ref 30.0–36.0)
MCV: 98.4 fL (ref 78.0–100.0)
Platelets: 406 10*3/uL — ABNORMAL HIGH (ref 150–400)
RBC: 4.35 MIL/uL (ref 4.22–5.81)
RDW: 13.1 % (ref 11.5–15.5)
WBC: 10.2 10*3/uL (ref 4.0–10.5)

## 2017-10-17 LAB — TYPE AND SCREEN
ABO/RH(D): B POS
Antibody Screen: NEGATIVE

## 2017-10-17 LAB — URINALYSIS, ROUTINE W REFLEX MICROSCOPIC
Bilirubin Urine: NEGATIVE
Glucose, UA: NEGATIVE mg/dL
Hgb urine dipstick: NEGATIVE
KETONES UR: 5 mg/dL — AB
Leukocytes, UA: NEGATIVE
Nitrite: NEGATIVE
PROTEIN: 30 mg/dL — AB
Specific Gravity, Urine: >1.046 — ABNORMAL HIGH (ref 1.005–1.030)
pH: 7 (ref 5.0–8.0)

## 2017-10-17 LAB — ABO/RH: ABO/RH(D): B POS

## 2017-10-17 LAB — LIPASE, BLOOD: LIPASE: 21 U/L (ref 11–51)

## 2017-10-17 LAB — I-STAT CG4 LACTIC ACID, ED
LACTIC ACID, VENOUS: 1.62 mmol/L (ref 0.5–1.9)
Lactic Acid, Venous: 1.18 mmol/L (ref 0.5–1.9)

## 2017-10-17 LAB — APTT: aPTT: 29 seconds (ref 24–36)

## 2017-10-17 MED ORDER — ACETAMINOPHEN 650 MG RE SUPP: 650.0000 mg | Freq: Four times a day (QID) | RECTAL | Status: DC | PRN

## 2017-10-17 MED ORDER — METOPROLOL TARTRATE 5 MG/5ML IV SOLN
INTRAVENOUS | Status: AC
  Administered 2017-10-17: 5 mg via INTRAVENOUS
  Filled 2017-10-17: qty 5

## 2017-10-17 MED ORDER — HYDRALAZINE HCL 20 MG/ML IJ SOLN
10.0000 mg | Freq: Three times a day (TID) | INTRAMUSCULAR | Status: DC
  Administered 2017-10-17 (×2): 10 mg via INTRAVENOUS
  Filled 2017-10-17: qty 1

## 2017-10-17 MED ORDER — IOPAMIDOL (ISOVUE-300) INJECTION 61%
100.0000 mL | Freq: Once | INTRAVENOUS | Status: AC | PRN
  Administered 2017-10-17: 100 mL via INTRAVENOUS

## 2017-10-17 MED ORDER — ACETAMINOPHEN 325 MG PO TABS: 650.0000 mg | ORAL_TABLET | Freq: Four times a day (QID) | ORAL | Status: DC | PRN

## 2017-10-17 MED ORDER — METOPROLOL TARTRATE 5 MG/5ML IV SOLN
5.0000 mg | Freq: Once | INTRAVENOUS | Status: AC
  Administered 2017-10-17: 5 mg via INTRAVENOUS

## 2017-10-17 MED ORDER — ONDANSETRON HCL 4 MG/2ML IJ SOLN
4.0000 mg | Freq: Once | INTRAMUSCULAR | Status: AC
  Administered 2017-10-17: 4 mg via INTRAVENOUS
  Filled 2017-10-17: qty 2

## 2017-10-17 MED ORDER — SODIUM CHLORIDE 0.9 % IV SOLN: INTRAVENOUS | Status: AC

## 2017-10-17 MED ORDER — SODIUM CHLORIDE 0.9% FLUSH
3.0000 mL | Freq: Two times a day (BID) | INTRAVENOUS | Status: DC
  Administered 2017-10-18 – 2017-10-28 (×15): 3 mL via INTRAVENOUS

## 2017-10-17 MED ORDER — FENTANYL CITRATE (PF) 100 MCG/2ML IJ SOLN
100.0000 ug | Freq: Once | INTRAMUSCULAR | Status: AC
  Administered 2017-10-17: 100 ug via INTRAVENOUS
  Filled 2017-10-17: qty 2

## 2017-10-17 MED ORDER — PIPERACILLIN-TAZOBACTAM 3.375 G IVPB 30 MIN
3.3750 g | Freq: Once | INTRAVENOUS | Status: AC
  Administered 2017-10-17: 3.375 g via INTRAVENOUS
  Filled 2017-10-17: qty 50

## 2017-10-17 MED ORDER — SODIUM CHLORIDE 0.9 % IV BOLUS (SEPSIS)
500.0000 mL | Freq: Once | INTRAVENOUS | Status: AC
  Administered 2017-10-17: 500 mL via INTRAVENOUS

## 2017-10-17 MED ORDER — ONDANSETRON HCL 4 MG PO TABS: 4.0000 mg | ORAL_TABLET | Freq: Four times a day (QID) | ORAL | Status: DC | PRN

## 2017-10-17 MED ORDER — HEPARIN SODIUM (PORCINE) 5000 UNIT/ML IJ SOLN
5000.0000 [IU] | Freq: Three times a day (TID) | INTRAMUSCULAR | Status: DC
  Administered 2017-10-17 – 2017-10-28 (×31): 5000 [IU] via SUBCUTANEOUS
  Filled 2017-10-17 (×30): qty 1

## 2017-10-17 MED ORDER — ONDANSETRON HCL 4 MG/2ML IJ SOLN: 4.0000 mg | Freq: Four times a day (QID) | INTRAMUSCULAR | Status: DC | PRN

## 2017-10-17 MED ORDER — PROMETHAZINE HCL 25 MG/ML IJ SOLN: 12.5000 mg | Freq: Four times a day (QID) | INTRAMUSCULAR | Status: DC | PRN

## 2017-10-17 NOTE — Consult Note (Signed)
Reason for Consult: hernia Referring Physician: Pricilla LovelessScott Goldston PCP;  Nicholas [name], Betti, MD   Nicholas FullingWillie J [name] is an 66 y.o. male.    HPI: Pt with a history of weight loss.  His daughter says he is lost around 100 pounds since his discharge from inpatient rehab in April 2018.  In order to evaluate this he was scheduled for a CT scan which was completed today.  The timing of the CT today is incidental.  When it was scheduled he was not having any pain or discomfort to speak of.  It is unclear but it sounds like he started having abdominal pain about 2 days ago followed by nausea and vomiting.  CT scan completed today as a scheduled outpatient procedure shows a high grade SBO caused by an umbilical hernia with a small portion of small bowel in the hernia, no ascites or pneumoperitoneum.  He is afebrile, but tachycardic. CMP shows a creatinine of 1.43.  Glucose of 130 chloride of 96 remainder of the CMP is normal., WBC is 20.2, H/H 14.9/42.8.  Platelets 406.  Urinalysis unremarkable.  Lactate is 1.62.  I have just ordered an EKG, chest x-ray, type and screen, and coag studies.  Pt in Rehab for lumbar radiculopathy, hx of tobacco use, CAD/MI/PCI, Aortic valve disease, PSVT,Carotid stenosis with occlusion, CVA, PVOD with left Fem-pop BPG, Type II diabetes, and hypertension.  Past Medical History:  Diagnosis Date  . At high risk for falls   . BPH (benign prostatic hypertrophy)    HX RETENTION  . Carotid artery occlusion    [name]/P RIGHT CEA  . Coronary artery disease CARDIOLOGIST--  DR Nicholas [name]   [name]/P  PCI TO LAD 1993  . GERD (gastroesophageal reflux disease)   . History of CVA (cerebrovascular accident)    1993---  NO RESIDUALS  . History of head injury    CONCUSSION--  NO RESIDUAL  . History of myocardial infarction    1993--  NON-Q WAVE  [name]/P PCI TO LAD  . Hydrocele, left   . Hyperlipidemia   . Hypertension   . Left carotid artery stenosis    MILD  . Myocardial infarction (HCC)   . PSVT (paroxysmal  supraventricular tachycardia) (HCC)   . PVD (peripheral vascular disease) (HCC)    [name]/P LEFT FEM-POP  . Right leg weakness    USES CANE--  SECONDARY TO PVD  . Type 2 diabetes mellitus (HCC)   . Wears glasses     Past Surgical History:  Procedure Laterality Date  . CARDIAC CATHETERIZATION  01-18-2003   DR Nicholas [name]   NON-OBSTRUCTIVE CAD/  PREVIOIS ANGIOPLASTY SITE WIDELY PATENT  . CARDIOVASCULAR STRESS TEST  2007   SMALL ANTERO-APICAL SCAR/  NO ISCHEMIA  . CAROTID ENDARTERECTOMY Right 05-31-2003  . CORONARY ANGIOPLASTY  1993   PCI TO LAD  . FEMORAL-POPLITEAL BYPASS GRAFT Left 01-19-2003  . RIGHT HYDROCELECTOMY  05-31-2003  . TRANSTHORACIC ECHOCARDIOGRAM  08-24-2010   EF 55%/  MILD AORTIC INSUFFICENCY/  MODERATE LVH    Family History  Problem Relation Age of Onset  . Stroke Mother   . Hypertension Mother   . Hypertension Father     Social History:  reports that he quit smoking about 23 years ago. His smoking use included cigarettes. He quit after 0.00 years of use. he has never used smokeless tobacco. He reports that he does not drink alcohol or use drugs. Quit smoking 10 years ago Lives alone Denies: ETOH or Drugs  Allergies: No Known Allergies  Prior  to Admission medications   Medication Sig Start Date End Date Taking? Authorizing Provider  amoxicillin (AMOXIL) 500 MG capsule Take 1 capsule (500 mg total) by mouth every 8 (eight) hours. 03/21/17   Love, Evlyn KannerPamela S, PA-C  clopidogrel (PLAVIX) 75 MG tablet Take 1 tablet (75 mg total) by mouth daily with breakfast. 03/21/17   Love, Evlyn KannerPamela S, PA-C  digoxin (LANOXIN) 0.25 MG tablet Take 1 tablet (0.25 mg total) by mouth daily. 03/21/17   Love, Evlyn KannerPamela S, PA-C  diltiazem (TAZTIA XT) 360 MG 24 hr capsule Take 1 capsule (360 mg total) by mouth daily. 03/21/17   Love, Evlyn KannerPamela S, PA-C  docusate sodium (COLACE) 50 MG capsule Take 50 mg by mouth at bedtime.     [provider]  doxazosin (CARDURA) 2 MG tablet Take 1 tablet (2 mg total)  by mouth daily. 03/21/17   Love, Evlyn KannerPamela S, PA-C  hydrALAZINE (APRESOLINE) 10 MG tablet Take 1 tablet (10 mg total) by mouth every 8 (eight) hours. 03/21/17   Love, Evlyn KannerPamela S, PA-C  metoprolol (LOPRESSOR) 50 MG tablet Take 1 tablet (50 mg total) by mouth 2 (two) times daily. 03/21/17   Love, Evlyn KannerPamela S, PA-C  polyethylene glycol (MIRALAX / GLYCOLAX) packet Take 17 g by mouth daily. 03/22/17   Love, Evlyn KannerPamela S, PA-C  rosuvastatin (CRESTOR) 10 MG tablet Take 1 tablet (10 mg total) by mouth daily. 03/21/17   Love, Evlyn KannerPamela S, PA-C  senna-docusate (SENOKOT-[name]) 8.6-50 MG tablet Take 1 tablet by mouth 2 (two) times daily. 03/21/17   Nicholas CreeLove, Pamela S, PA-C     Results for orders placed or performed during the hospital encounter of 10/17/17 (from the past 48 hour([name]))  CBC     Status: Abnormal   Collection Time: 10/17/17  2:43 PM  Result Value Ref Range   WBC 10.2 4.0 - 10.5 K/uL   RBC 4.35 4.22 - 5.81 MIL/uL   Hemoglobin 14.9 13.0 - 17.0 g/dL   HCT 16.142.8 09.639.0 - 04.552.0 %   MCV 98.4 78.0 - 100.0 fL   MCH 34.3 (H) 26.0 - 34.0 pg   MCHC 34.8 30.0 - 36.0 g/dL   RDW 40.913.1 81.111.5 - 91.415.5 %   Platelets 406 (H) 150 - 400 K/uL  I-Stat CG4 Lactic Acid, ED     Status: None   Collection Time: 10/17/17  3:06 PM  Result Value Ref Range   Lactic Acid, Venous 1.62 0.5 - 1.9 mmol/L    Ct Abdomen Pelvis W Contrast  Result Date: 10/17/2017 CLINICAL DATA:  66 year old male with abdominal and pelvic pain for several months with vomiting. Unexplained weight loss. EXAM: CT ABDOMEN AND PELVIS WITH CONTRAST TECHNIQUE: Multidetector CT imaging of the abdomen and pelvis was performed using the standard protocol following bolus administration of intravenous contrast. CONTRAST:  100mL ISOVUE-300 IOPAMIDOL (ISOVUE-300) INJECTION 61% COMPARISON:  10/09/2015 and prior CTs FINDINGS: Lower chest: No acute abnormality Hepatobiliary: The liver and gallbladder are unremarkable. No biliary dilatation. Pancreas: Unremarkable Spleen: Unremarkable  Adrenals/Urinary Tract: The kidneys, adrenal glands and bladder are unremarkable. Stomach/Bowel: Dilated stomach and proximal - mid small bowel loops are noted with collapsed distal small bowel loops compatible with high-grade small bowel obstruction. Obstruction point is located at a small umbilical hernia containing a portion of small bowel. There is no evidence of ascites, abscess or pneumoperitoneum. No other bowel abnormalities are identified. Vascular/Lymphatic: Aortic atherosclerosis. No enlarged abdominal or pelvic lymph nodes. Reproductive: Prostate is unremarkable. Other: No abdominal wall hernia or abnormality. No abdominopelvic ascites.  Musculoskeletal: No acute or suspicious abnormalities identified. IMPRESSION: 1. High-grade small bowel obstruction caused by a small umbilical hernia containing a portion of small bowel. No evidence of ascites, pneumoperitoneum or abscess. 2.  Aortic Atherosclerosis (ICD10-I70.0). Critical Value/emergent results were called by telephone at the time of interpretation on 10/17/2017 at 12:42 pm to Michaelyn Barter, nurse with Dr. [name] , who verbally acknowledged these results. Electronically Signed   By: Harmon Pier M.D.   On: 10/17/2017 12:43    Review of Systems  Constitutional: Positive for malaise/fatigue and weight loss (100 lb over last 5-6 mo).       Pt is to sick to answer questions, most of info came from his daughter.  He can't really remember when he got sick.   HENT: Negative.   Eyes: Negative.   Cardiovascular: Positive for leg swelling and PND.  Gastrointestinal: Positive for abdominal pain, nausea and vomiting.  Genitourinary: Negative.   Musculoskeletal: Positive for back pain and neck pain.  Skin: Negative.   Neurological: Positive for weakness.  Endo/Heme/Allergies: Bruises/bleeds easily.   Blood pressure (!) 156/70, pulse (!) 125, temperature 98.7 F (37.1 C), temperature source Oral, resp. rate 20, height 5\' 11"  (1.803 m), weight  81.2 kg (179 lb), SpO2 97 %. Physical Exam  Constitutional: He is oriented to person, place, and time.  Chronically ill 66 year old male.  Currently he is in the ED and having significant abdominal discomfort.  He cannot remember when he got sick.  He is not sure when he ate last.  Not sure when he had his last bowel movement. While in the room he had one episode of apnea on the monitor.  He also had a variable heart rate between the upper 70s as high as 128.  HENT:  Head: Normocephalic and atraumatic.  Mouth/Throat: No oropharyngeal exudate.  Eyes: Right eye exhibits no discharge. Left eye exhibits no discharge. No scleral icterus.  Pupils are equal  Neck: Normal range of motion. Neck supple. No JVD present. No tracheal deviation present. No thyromegaly present.  Large keloid from right carotid endarterectomy scar.  Cardiovascular:  Murmur (Systolic murmur apex and the base.) heard. Rates been variable, currently in a sinus rhythm.  It varies between 79 and upper 120s.  Respiratory: Breath sounds normal. No respiratory distress. He has no wheezes. He has no rales. He exhibits no tenderness.  GI: Soft. Bowel sounds are normal. He exhibits distension (Some distention). He exhibits no mass. There is tenderness. There is no rebound and no guarding.            There is a palpable umbilical hernia.  I tried to reduce it but was unsuccessful.  It is fairly uncomfortable for him to have this area manipulated.                                                                                                                        Musculoskeletal: He exhibits edema and tenderness.  Lymphadenopathy:  He has no cervical adenopathy.  Neurological: He is alert and oriented to person, place, and time. No cranial nerve deficit.  Skin: Skin is warm and dry. No rash noted. No erythema. No pallor.  Psychiatric: He has a normal mood and affect. His behavior is normal. Judgment and thought content normal.     Assessment/Plan: Incarcerated Umbilical hernia with SBO Chronic illness with 100 lb weight loss since April 2018 Recent In Pt rehab for lumbar radiculopathy Hx of tobacco use CAD/Hx of MI/history of PSVT  -  On Plavix last dose 10/16/17 Aortic and mitral regurgitation./ EF 60-65% 03/2017 Echo Carotid stenosis and occlusion with hx of CVA PVOD - [name]/p Left Fem-pop BPG Type II diabetes Hypertension Probable malnutrition and deconditioning  Plan:  I think he will need surgery this PM, Dr. Carolynne Edouard and Dr. Andrey Campanile aware.  I have ordered an EKG and chest x-ray, coag studies, type and screen.  I have asked the ER to start him on some Zosyn.   With his multiple medical comorbidities I have asked the emergency department to have medicine admit for medical management post surgery.  Dr.Toth saw him and got the hernia reduced.  We are going to let Medicine admit and tune him up.  Let the Plavix wear off and do him before he goes home.    Reno Clasby 10/17/2017, 3:32 PM

## 2017-10-17 NOTE — Progress Notes (Signed)
@  2215 Dr. Saunders RevelNedrud of [facility] paged regarding pt's sporadic HR (periods of NSR 70s-80s followed by ST/SVT 130s-140s). MD returned page and endorsed she and her constituent would come assess pt.  @approx  2245 MD at bedside and orders placed. Will continue to monitor.

## 2017-10-17 NOTE — ED Triage Notes (Signed)
[facility] following scan today, patient has had significant weight lose the past month, now vomiting and constipation

## 2017-10-17 NOTE — H&P (Signed)
Date: 10/17/2017               Patient Name:  Nicholas [name] MRN: [id number]  DOB: 07/22/1951 Age / Sex: 66 y.o., male   PCP: Leilani Ableeese, Betti, MD         Medical Service: Internal Medicine Teaching Service         Attending Physician: Dr. Earl LagosNarendra, Nischal, MD    First Contact: Dr. Johnny BridgeSaraiya Pager: [phone]  Second Contact: Dr. Delma Officerhundi Pager: [phone]       After Hours (After 5p/  First Contact Pager: [phone]  weekends / holidays): Second Contact Pager: [phone]   Chief Complaint: Small bowel obstruction  History of Present Illness: Mr. Nicholas FarmerMayfield is a 66 y.o m wityh pmh of htn, cad s/p pci to lad 1993, type 2 diabetes mellitus who presented with small bowel obstruction.  Patient has had 100 pound weight loss over the past 3 months without any change in his diet or change in physical activity.  The patient's weight loss was scheduled to be worked up with a CT scan today 10/17/2017.  CT scan results showed a high-grade SBO caused by umbilical hernia.  [facility]   The patient has been having abdominal pain and constipation for the past 1 week. The abdominal pain is 10/10 intensity, located in the umbilical region, sharp in nature, nonradiating.  She has also been vomiting since yesterday 10/16/2017.  The patient vomited 3 times yesterday and 4 times today.  Patient denies any fever, shortness of breath, lightheadedness, dizziness, diarrhea.  ED course: Patient was normotensive, afebrile, not tachypneic. The ED physician tried to reduce the umbilical hernia, gave IV fentanyl and Zofran , placed n.p.o. consulted surgery.   Meds:  Current Meds  Medication Sig  . clopidogrel (PLAVIX) 75 MG tablet Take 1 tablet (75 mg total) by mouth daily with breakfast.  . digoxin (LANOXIN) 0.25 MG tablet Take 1 tablet (0.25 mg total) by mouth daily.  Marland Kitchen. diltiazem (TAZTIA XT) 360 MG 24 hr capsule Take 1 capsule (360 mg total) by mouth daily.  Marland Kitchen. docusate sodium (COLACE)  50 MG capsule Take 50 mg by mouth at bedtime.   Marland Kitchen. doxazosin (CARDURA) 2 MG tablet Take 1 tablet (2 mg total) by mouth daily.  Marland Kitchen. glipiZIDE (GLUCOTROL) 5 MG tablet Take 5 mg daily before breakfast by mouth.  . hydrALAZINE (APRESOLINE) 10 MG tablet Take 1 tablet (10 mg total) by mouth every 8 (eight) hours.  . metoprolol (LOPRESSOR) 50 MG tablet Take 1 tablet (50 mg total) by mouth 2 (two) times daily.  . rosuvastatin (CRESTOR) 10 MG tablet Take 1 tablet (10 mg total) by mouth daily.     Allergies: Allergies as of 10/17/2017  . (No Known Allergies)   Past Medical History:  Diagnosis Date  . At high risk for falls   . BPH (benign prostatic hypertrophy)    HX RETENTION  . Carotid artery occlusion    S/P RIGHT CEA  . Coronary artery disease CARDIOLOGIST--  DR Nicholas [name]   S/P  PCI TO LAD 1993  . GERD (gastroesophageal reflux disease)   . History of CVA (cerebrovascular accident)    1993---  NO RESIDUALS  . History of head injury    CONCUSSION--  NO RESIDUAL  . History of myocardial infarction    1993--  NON-Q WAVE  S/P PCI TO LAD  . Hydrocele, left   . Hyperlipidemia   . Hypertension   .  Left carotid artery stenosis    MILD  . Myocardial infarction (HCC)   . PSVT (paroxysmal supraventricular tachycardia) (HCC)   . PVD (peripheral vascular disease) (HCC)    S/P LEFT FEM-POP  . Right leg weakness    USES CANE--  SECONDARY TO PVD  . Type 2 diabetes mellitus (HCC)   . Wears glasses     Family History:  Hypertension-parents Diabetes mellitus-nephew  Social History:   Lives alone Smoke cigarettes 10 years 2 packs/day, quit 20 years ago No EtOH, no drugs  Review of Systems: A complete ROS was negative except as per HPI.   Physical Exam: Blood pressure (!) 157/76, pulse 78, temperature 98.7 F (37.1 C), temperature source Oral, resp. rate 19, height 5\' 11"  (1.803 m), weight 179 lb (81.2 kg), SpO2 96 %.   Physical Exam  Constitutional: He appears well-developed and  well-nourished.  HENT:  Head: Normocephalic and atraumatic.  Eyes: Conjunctivae are normal.  Cardiovascular: Normal rate, regular rhythm and intact distal pulses.  Murmur (continuous systolic) heard. Pulmonary/Chest: Effort normal and breath sounds normal. No stridor. No respiratory distress.  Abdominal: Soft. Bowel sounds are normal. He exhibits no distension. There is tenderness (umbilical ). No hernia.  Musculoskeletal: He exhibits edema (left leg appears more swollen in comparison to right).  Neurological: He is alert.  The patient's left foot had 2/5 strength. Bilateral upper extremity strength 5/5  Psychiatric: He has a normal mood and affect. His behavior is normal. Judgment and thought content normal.    EKG: Pending  CXR: No cardiopulmonary abnormalities  CT abdomen pelvis with contrast: High-grade small bowel obstruction caused by small umbilical hernia containing a portion of the small bowel.  No ascites, pneumoperitoneum or abscess.  Aortic atherosclerosis   Assessment & Plan by Problem:  66 year old male with past medical history of diabetes, hypertension, CVA, CAD who presents with known diagnosis of SBO based on CT imaging.  Small bowel obstruction Incarcerated umbilical hernia with SBO Patient has had abdominal pain for the past week with constipation and 100 pound weight loss over the past 3 months.  CT scan 10/17/2017 showed high-grade small bowel obstruction caused by a small umbilical hernia containing a portion of the small bowel.  -Surgery consulted and evaluated the patient in the ED. They were able to reduce his hernia and recommended that Plavix should wear off prior to surgery. -N.p.o. -Phenergan  -Zosyn 3.375g 100 ml/hr -Continue cardiac monitoring -Monitor I/O  Hypertension His blood pressure and since admission has ranged 36-186/71-76.  Diabetes mellitus -CBG monitoring  Dispo: Admit patient to Inpatient with expected length of stay greater than 2  midnights.  Signed: Lorenso CourierVahini Korinne Greenstein, MD Internal Medicine PGY1 [phone] 10/17/2017, 7:31 PM

## 2017-10-17 NOTE — ED Notes (Signed)
Report attempted 

## 2017-10-17 NOTE — Progress Notes (Signed)
Received to room 6n5 at this time from ED. Bed alarm set.

## 2017-10-17 NOTE — Progress Notes (Signed)
Internal Medicine notified of arrival to unit and that pt has been ST(110-135) since arrival.

## 2017-10-17 NOTE — ED Provider Notes (Signed)
[facility] EMERGENCY DEPARTMENT Provider Note   CSN: [id number] Arrival date & time: 10/17/17  1433     History   Chief Complaint No chief complaint on file.   HPI Nicholas [name] is a 66 y.o. male.  HPI  66 year old male presents with a bowel obstruction seen on CT scan this morning.  Patient states that he has been having vomiting and increased pain at his umbilicus starting 2 days ago.  He has had an outpatient CT scan scheduled due to weight loss.  He was called with the results this morning that he has a high-grade bowel obstruction and told to come to the ER.  Patient states his pain is a 10/10.  He has a chronic history of constipation and frequently strains to have a bowel movement.  Frequently the umbilical hernia will pop out during this time but he thinks it has been stuck out only since starting 2 days ago.  Past Medical History:  Diagnosis Date  . At high risk for falls   . BPH (benign prostatic hypertrophy)    HX RETENTION  . Carotid artery occlusion    S/P RIGHT CEA  . Coronary artery disease CARDIOLOGIST--  DR Johney Frame   S/P  PCI TO LAD 1993  . GERD (gastroesophageal reflux disease)   . History of CVA (cerebrovascular accident)    1993---  NO RESIDUALS  . History of head injury    CONCUSSION--  NO RESIDUAL  . History of myocardial infarction    1993--  NON-Q WAVE  S/P PCI TO LAD  . Hydrocele, left   . Hyperlipidemia   . Hypertension   . Left carotid artery stenosis    MILD  . Myocardial infarction (HCC)   . PSVT (paroxysmal supraventricular tachycardia) (HCC)   . PVD (peripheral vascular disease) (HCC)    S/P LEFT FEM-POP  . Right leg weakness    USES CANE--  SECONDARY TO PVD  . Type 2 diabetes mellitus (HCC)   . Wears glasses     Patient Active Problem List   Diagnosis Date Noted  . SBO (small bowel obstruction) (HCC) 10/17/2017  . Bacterial UTI 03/19/2017  . Adjustment disorder   . Slow transit constipation   . Weakness of  both lower extremities   . Bilateral foot-drop   . Lumbar radiculopathy 03/13/2017  . Lumbar spinal stenosis 03/13/2017  . Foot drop, right   . Urinary retention due to benign prostatic hyperplasia   . Benign essential HTN   . History of CVA (cerebrovascular accident) 03/11/2017  . Cognitive deficits, late effect of cerebrovascular disease 07/23/2014  . 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 Aortic valve disorder 08/09/2010  . CLAUDICATION 08/09/2010  . Hyperlipidemia 08/08/2010  . TOBACCO ABUSE 08/08/2010  . Essential hypertension 08/08/2010  . CAD, NATIVE VESSEL 08/08/2010  . Occlusion and stenosis of carotid artery 08/08/2010    Past Surgical History:  Procedure Laterality Date  . CARDIAC CATHETERIZATION  01-18-2003   DR Eden Emms   NON-OBSTRUCTIVE CAD/  PREVIOIS ANGIOPLASTY SITE WIDELY PATENT  . CARDIOVASCULAR STRESS TEST  2007   SMALL ANTERO-APICAL SCAR/  NO ISCHEMIA  . CAROTID ENDARTERECTOMY Right 05-31-2003  . CORONARY ANGIOPLASTY  1993   PCI TO LAD  . FEMORAL-POPLITEAL BYPASS GRAFT Left 01-19-2003  . RIGHT HYDROCELECTOMY  05-31-2003  . TRANSTHORACIC ECHOCARDIOGRAM  08-24-2010   EF 55%/  MILD AORTIC INSUFFICENCY/  MODERATE LVH       Home Medications    Prior  to Admission medications   Medication Sig Start Date End Date Taking? Authorizing Provider  clopidogrel (PLAVIX) 75 MG tablet Take 1 tablet (75 mg total) by mouth daily with breakfast. 03/21/17  Yes Love, Evlyn KannerPamela S, PA-C  digoxin (LANOXIN) 0.25 MG tablet Take 1 tablet (0.25 mg total) by mouth daily. 03/21/17  Yes Love, Evlyn KannerPamela S, PA-C  diltiazem (TAZTIA XT) 360 MG 24 hr capsule Take 1 capsule (360 mg total) by mouth daily. 03/21/17  Yes Love, Evlyn KannerPamela S, PA-C  docusate sodium (COLACE) 50 MG capsule Take 50 mg by mouth at bedtime.    Yes [provider]  doxazosin (CARDURA) 2 MG tablet Take 1 tablet (2 mg total) by mouth daily. 03/21/17  Yes Love, Evlyn KannerPamela S, PA-C  glipiZIDE (GLUCOTROL) 5 MG  tablet Take 5 mg daily before breakfast by mouth.   Yes [provider]  hydrALAZINE (APRESOLINE) 10 MG tablet Take 1 tablet (10 mg total) by mouth every 8 (eight) hours. 03/21/17  Yes Love, Evlyn KannerPamela S, PA-C  metoprolol (LOPRESSOR) 50 MG tablet Take 1 tablet (50 mg total) by mouth 2 (two) times daily. 03/21/17  Yes Love, Evlyn KannerPamela S, PA-C  rosuvastatin (CRESTOR) 10 MG tablet Take 1 tablet (10 mg total) by mouth daily. 03/21/17  Yes Love, Evlyn KannerPamela S, PA-C  amoxicillin (AMOXIL) 500 MG capsule Take 1 capsule (500 mg total) by mouth every 8 (eight) hours. Patient not taking: Reported on 10/17/2017 03/21/17   Love, Evlyn KannerPamela S, PA-C  polyethylene glycol (MIRALAX / GLYCOLAX) packet Take 17 g by mouth daily. Patient not taking: Reported on 10/17/2017 03/22/17   Love, Evlyn KannerPamela S, PA-C  senna-docusate (SENOKOT-S) 8.6-50 MG tablet Take 1 tablet by mouth 2 (two) times daily. Patient not taking: Reported on 10/17/2017 03/21/17   Love, Evlyn KannerPamela S, PA-C    Family History Family History  Problem Relation Age of Onset  . Stroke Mother   . Hypertension Mother   . Hypertension Father     Social History Social History   Tobacco Use  . Smoking status: Former Smoker    Years: 0.00    Types: Cigarettes    Last attempt to quit: 10/26/1993    Years since quitting: 23.9  . Smokeless tobacco: Never Used  Substance Use Topics  . Alcohol use: No  . Drug use: No     Allergies   Patient has no known allergies.   Review of Systems Review of Systems  Constitutional: Negative for fever.  Respiratory: Negative for shortness of breath.   Gastrointestinal: Positive for abdominal pain, constipation, nausea and vomiting.  All other systems reviewed and are negative.    Physical Exam Updated Vital Signs BP (!) 157/76   Pulse 78   Temp 98.7 F (37.1 C) (Oral)   Resp 19   Ht 5\' 11"  (1.803 m)   Wt 81.2 kg (179 lb)   SpO2 96%   BMI 24.97 kg/m   Physical Exam  Constitutional: He is oriented to person, place,  and time. He appears well-developed and well-nourished.  HENT:  Head: Normocephalic and atraumatic.  Right Ear: External ear normal.  Left Ear: External ear normal.  Nose: Nose normal.  Eyes: Right eye exhibits no discharge. Left eye exhibits no discharge.  Neck: Neck supple.  Cardiovascular: Normal rate, regular rhythm and normal heart sounds.  Pulmonary/Chest: Effort normal and breath sounds normal.  Abdominal: Soft. There is tenderness. A hernia is present. Hernia confirmed positive in the ventral area.  Musculoskeletal: He exhibits edema (Chronic LLE edema).  Neurological: He is alert and oriented to person, place, and time.  Skin: Skin is warm and dry. He is not diaphoretic.  Nursing note and vitals reviewed.    ED Treatments / Results  Labs (all labs ordered are listed, but only abnormal results are displayed) Labs Reviewed  COMPREHENSIVE METABOLIC PANEL - Abnormal; Notable for the following components:      Result Value   Chloride 96 (*)    Glucose, Bld 130 (*)    Creatinine, Ser 1.43 (*)    GFR calc non Af Amer 50 (*)    GFR calc Af Amer 57 (*)    All other components within normal limits  CBC - Abnormal; Notable for the following components:   MCH 34.3 (*)    Platelets 406 (*)    All other components within normal limits  URINALYSIS, ROUTINE W REFLEX MICROSCOPIC - Abnormal; Notable for the following components:   Specific Gravity, Urine >1.046 (*)    Ketones, ur 5 (*)    Protein, ur 30 (*)    Bacteria, UA RARE (*)    Squamous Epithelial / LPF 0-5 (*)    All other components within normal limits  LIPASE, BLOOD  PROTIME-INR  APTT  I-STAT CG4 LACTIC ACID, ED  I-STAT CG4 LACTIC ACID, ED  TYPE AND SCREEN    EKG  EKG Interpretation None       Radiology Ct Abdomen Pelvis W Contrast  Result Date: 10/17/2017 CLINICAL DATA:  66 year old male with abdominal and pelvic pain for several months with vomiting. Unexplained weight loss. EXAM: CT ABDOMEN AND PELVIS  WITH CONTRAST TECHNIQUE: Multidetector CT imaging of the abdomen and pelvis was performed using the standard protocol following bolus administration of intravenous contrast. CONTRAST:  100mL ISOVUE-300 IOPAMIDOL (ISOVUE-300) INJECTION 61% COMPARISON:  10/09/2015 and prior CTs FINDINGS: Lower chest: No acute abnormality Hepatobiliary: The liver and gallbladder are unremarkable. No biliary dilatation. Pancreas: Unremarkable Spleen: Unremarkable Adrenals/Urinary Tract: The kidneys, adrenal glands and bladder are unremarkable. Stomach/Bowel: Dilated stomach and proximal - mid small bowel loops are noted with collapsed distal small bowel loops compatible with high-grade small bowel obstruction. Obstruction point is located at a small umbilical hernia containing a portion of small bowel. There is no evidence of ascites, abscess or pneumoperitoneum. No other bowel abnormalities are identified. Vascular/Lymphatic: Aortic atherosclerosis. No enlarged abdominal or pelvic lymph nodes. Reproductive: Prostate is unremarkable. Other: No abdominal wall hernia or abnormality. No abdominopelvic ascites. Musculoskeletal: No acute or suspicious abnormalities identified. IMPRESSION: 1. High-grade small bowel obstruction caused by a small umbilical hernia containing a portion of small bowel. No evidence of ascites, pneumoperitoneum or abscess. 2.  Aortic Atherosclerosis (ICD10-I70.0). Critical Value/emergent results were called by telephone at the time of interpretation on 10/17/2017 at 12:42 pm to [name], nurse with Dr. Leilani AbleBETTI [name] , who verbally acknowledged these results. Electronically Signed   By: Harmon PierJeffrey  Hu M.D.   On: 10/17/2017 12:43   Dg Chest Port 1 View  Result Date: 10/17/2017 CLINICAL DATA:  Preoperative evaluation, history of small-bowel obstruction EXAM: PORTABLE CHEST 1 VIEW COMPARISON:  04/26/2014 FINDINGS: Cardiac shadow is stable. The lungs are well aerated bilaterally. No focal infiltrate or sizable  effusion is seen. No bony abnormality is noted. IMPRESSION: No active disease. Electronically Signed   By: Alcide CleverMark  Lukens M.D.   On: 10/17/2017 16:34    Procedures Procedures (including critical care time)  Medications Ordered in ED Medications  piperacillin-tazobactam (ZOSYN) IVPB 3.375 g (3.375 g Intravenous New Bag/Given 10/17/17 1646)  hydrALAZINE (APRESOLINE) injection 10 mg (not administered)  fentaNYL (SUBLIMAZE) injection 100 mcg (100 mcg Intravenous Given 10/17/17 1542)  ondansetron (ZOFRAN) injection 4 mg (4 mg Intravenous Given 10/17/17 1541)  sodium chloride 0.9 % bolus 500 mL (500 mLs Intravenous New Bag/Given 10/17/17 1541)     Initial Impression / Assessment and Plan / ED Course  I have reviewed the triage vital signs and the nursing notes.  Pertinent labs & imaging results that were available during my care of the patient were reviewed by me and considered in my medical decision making (see chart for details).     Patient presents with now 2 days of an incarcerated hernia.  Fortunately he was getting a CT scan today anyway which shows a high-grade small bowel obstruction due to this.  I discussed with Will Marlyne Beards of surgery who has evaluate the patient.  I did briefly try to reduce the hernia but the pain was too great despite IV fentanyl and the patient started to almost vomit so this was terminated early.  He will need surgery.  Internal medicine teaching service to admit the patient as well.  Keep n.p.o., fluids, pain control.  Surgery asked for Zosyn as well.  At this point it does not appear that he has ischemia present as he has a normal WBC and normal lactate.  He is also not in intractable pain.  Final Clinical Impressions(s) / ED Diagnoses   Final diagnoses:  Incarcerated umbilical hernia  Small bowel obstruction [facility])    ED Discharge Orders    None       Pricilla Loveless, MD 10/17/17 1706

## 2017-10-18 DIAGNOSIS — Z8673 Personal history of transient ischemic attack (TIA), and cerebral infarction without residual deficits: Secondary | ICD-10-CM | POA: Diagnosis not present

## 2017-10-18 DIAGNOSIS — M545 Low back pain: Secondary | ICD-10-CM | POA: Diagnosis not present

## 2017-10-18 DIAGNOSIS — K5900 Constipation, unspecified: Secondary | ICD-10-CM | POA: Diagnosis not present

## 2017-10-18 DIAGNOSIS — Z79899 Other long term (current) drug therapy: Secondary | ICD-10-CM | POA: Diagnosis not present

## 2017-10-18 DIAGNOSIS — E46 Unspecified protein-calorie malnutrition: Secondary | ICD-10-CM | POA: Diagnosis present

## 2017-10-18 DIAGNOSIS — N179 Acute kidney failure, unspecified: Secondary | ICD-10-CM | POA: Diagnosis present

## 2017-10-18 DIAGNOSIS — R338 Other retention of urine: Secondary | ICD-10-CM | POA: Diagnosis present

## 2017-10-18 DIAGNOSIS — I471 Supraventricular tachycardia: Secondary | ICD-10-CM | POA: Diagnosis not present

## 2017-10-18 DIAGNOSIS — R0681 Apnea, not elsewhere classified: Secondary | ICD-10-CM | POA: Diagnosis present

## 2017-10-18 DIAGNOSIS — I080 Rheumatic disorders of both mitral and aortic valves: Secondary | ICD-10-CM | POA: Diagnosis present

## 2017-10-18 DIAGNOSIS — N401 Enlarged prostate with lower urinary tract symptoms: Secondary | ICD-10-CM | POA: Diagnosis present

## 2017-10-18 DIAGNOSIS — K56609 Unspecified intestinal obstruction, unspecified as to partial versus complete obstruction: Secondary | ICD-10-CM | POA: Diagnosis not present

## 2017-10-18 DIAGNOSIS — Z7984 Long term (current) use of oral hypoglycemic drugs: Secondary | ICD-10-CM | POA: Diagnosis not present

## 2017-10-18 DIAGNOSIS — Z9889 Other specified postprocedural states: Secondary | ICD-10-CM | POA: Diagnosis not present

## 2017-10-18 DIAGNOSIS — K5903 Drug induced constipation: Secondary | ICD-10-CM | POA: Diagnosis present

## 2017-10-18 DIAGNOSIS — R5381 Other malaise: Secondary | ICD-10-CM | POA: Diagnosis present

## 2017-10-18 DIAGNOSIS — M5416 Radiculopathy, lumbar region: Secondary | ICD-10-CM | POA: Diagnosis present

## 2017-10-18 DIAGNOSIS — Z8719 Personal history of other diseases of the digestive system: Secondary | ICD-10-CM | POA: Diagnosis not present

## 2017-10-18 DIAGNOSIS — Z955 Presence of coronary angioplasty implant and graft: Secondary | ICD-10-CM

## 2017-10-18 DIAGNOSIS — M6289 Other specified disorders of muscle: Secondary | ICD-10-CM | POA: Diagnosis not present

## 2017-10-18 DIAGNOSIS — E1151 Type 2 diabetes mellitus with diabetic peripheral angiopathy without gangrene: Secondary | ICD-10-CM | POA: Diagnosis present

## 2017-10-18 DIAGNOSIS — F17210 Nicotine dependence, cigarettes, uncomplicated: Secondary | ICD-10-CM | POA: Diagnosis present

## 2017-10-18 DIAGNOSIS — I2510 Atherosclerotic heart disease of native coronary artery without angina pectoris: Secondary | ICD-10-CM

## 2017-10-18 DIAGNOSIS — T402X5A Adverse effect of other opioids, initial encounter: Secondary | ICD-10-CM | POA: Diagnosis present

## 2017-10-18 DIAGNOSIS — R011 Cardiac murmur, unspecified: Secondary | ICD-10-CM | POA: Diagnosis not present

## 2017-10-18 DIAGNOSIS — I6522 Occlusion and stenosis of left carotid artery: Secondary | ICD-10-CM | POA: Diagnosis present

## 2017-10-18 DIAGNOSIS — Z6826 Body mass index (BMI) 26.0-26.9, adult: Secondary | ICD-10-CM | POA: Diagnosis not present

## 2017-10-18 DIAGNOSIS — E785 Hyperlipidemia, unspecified: Secondary | ICD-10-CM | POA: Diagnosis present

## 2017-10-18 DIAGNOSIS — I252 Old myocardial infarction: Secondary | ICD-10-CM | POA: Diagnosis not present

## 2017-10-18 DIAGNOSIS — E119 Type 2 diabetes mellitus without complications: Secondary | ICD-10-CM | POA: Diagnosis not present

## 2017-10-18 DIAGNOSIS — R339 Retention of urine, unspecified: Secondary | ICD-10-CM | POA: Diagnosis not present

## 2017-10-18 DIAGNOSIS — Z960 Presence of urogenital implants: Secondary | ICD-10-CM | POA: Diagnosis not present

## 2017-10-18 DIAGNOSIS — I493 Ventricular premature depolarization: Secondary | ICD-10-CM | POA: Diagnosis present

## 2017-10-18 DIAGNOSIS — K420 Umbilical hernia with obstruction, without gangrene: Secondary | ICD-10-CM | POA: Diagnosis present

## 2017-10-18 DIAGNOSIS — I10 Essential (primary) hypertension: Secondary | ICD-10-CM | POA: Diagnosis present

## 2017-10-18 LAB — COMPREHENSIVE METABOLIC PANEL
ALT: 26 U/L (ref 17–63)
AST: 31 U/L (ref 15–41)
Albumin: 3.7 g/dL (ref 3.5–5.0)
Alkaline Phosphatase: 82 U/L (ref 38–126)
Anion gap: 8 (ref 5–15)
BUN: 12 mg/dL (ref 6–20)
CO2: 28 mmol/L (ref 22–32)
Calcium: 9 mg/dL (ref 8.9–10.3)
Chloride: 102 mmol/L (ref 101–111)
Creatinine, Ser: 1.21 mg/dL (ref 0.61–1.24)
GFR calc Af Amer: >60 mL/min (ref >60)
Glucose, Bld: 103 mg/dL — ABNORMAL HIGH (ref 65–99)
POTASSIUM: 3.8 mmol/L (ref 3.5–5.1)
SODIUM: 138 mmol/L (ref 135–145)
Total Bilirubin: 1 mg/dL (ref 0.3–1.2)
Total Protein: 6.2 g/dL — ABNORMAL LOW (ref 6.5–8.1)

## 2017-10-18 LAB — CBC
HCT: 38.7 % — ABNORMAL LOW (ref 39.0–52.0)
Hemoglobin: 13.5 g/dL (ref 13.0–17.0)
MCH: 34.6 pg — ABNORMAL HIGH (ref 26.0–34.0)
MCHC: 34.9 g/dL (ref 30.0–36.0)
MCV: 99.2 fL (ref 78.0–100.0)
PLATELETS: 375 10*3/uL (ref 150–400)
RBC: 3.9 MIL/uL — ABNORMAL LOW (ref 4.22–5.81)
RDW: 13.2 % (ref 11.5–15.5)
WBC: 11.5 10*3/uL — AB (ref 4.0–10.5)

## 2017-10-18 LAB — GLUCOSE, CAPILLARY: GLUCOSE-CAPILLARY: 82 mg/dL (ref 65–99)

## 2017-10-18 MED ORDER — LIDOCAINE HCL 2 % EX GEL
1.0000 "application " | Freq: Once | CUTANEOUS | Status: AC
  Administered 2017-10-18: 1 via URETHRAL
  Filled 2017-10-18: qty 20

## 2017-10-18 MED ORDER — KCL IN DEXTROSE-NACL 20-5-0.9 MEQ/L-%-% IV SOLN
INTRAVENOUS | Status: DC
  Administered 2017-10-18 – 2017-10-21 (×7): via INTRAVENOUS
  Administered 2017-10-21: 1 mL via INTRAVENOUS
  Administered 2017-10-22: 08:00:00 via INTRAVENOUS
  Filled 2017-10-18 (×10): qty 1000

## 2017-10-18 MED ORDER — METOPROLOL TARTRATE 50 MG PO TABS
50.0000 mg | ORAL_TABLET | Freq: Two times a day (BID) | ORAL | Status: DC
  Administered 2017-10-18 – 2017-10-28 (×19): 50 mg via ORAL
  Filled 2017-10-18 (×20): qty 1

## 2017-10-18 MED ORDER — DOXAZOSIN MESYLATE 2 MG PO TABS
2.0000 mg | ORAL_TABLET | Freq: Every day | ORAL | Status: DC
  Administered 2017-10-18 – 2017-10-28 (×10): 2 mg via ORAL
  Filled 2017-10-18 (×11): qty 1

## 2017-10-18 MED ORDER — HYDRALAZINE HCL 10 MG PO TABS
10.0000 mg | ORAL_TABLET | Freq: Three times a day (TID) | ORAL | Status: DC
  Administered 2017-10-18 – 2017-10-28 (×26): 10 mg via ORAL
  Filled 2017-10-18 (×29): qty 1

## 2017-10-18 MED ORDER — DIGOXIN 250 MCG PO TABS
0.2500 mg | ORAL_TABLET | Freq: Every day | ORAL | Status: DC
  Administered 2017-10-18 – 2017-10-28 (×10): 0.25 mg via ORAL
  Filled 2017-10-18 (×11): qty 1

## 2017-10-18 MED ORDER — DILTIAZEM HCL ER BEADS 240 MG PO CP24
360.0000 mg | ORAL_CAPSULE | Freq: Every day | ORAL | Status: DC
  Administered 2017-10-18: 13:00:00 360 mg via ORAL
  Filled 2017-10-18 (×2): qty 1

## 2017-10-18 NOTE — Progress Notes (Signed)
   Subjective: Nicholas [name] was seen laying in his bed this morning.  He states that he does not have any nausea, vomiting, shortness of breath, chest pain.  However, the patient states that he is uncomfortable laying in his bed and that his lower back and buttock area have been painful.  Patient states that he has fallen on his back several times and this has made it got uncomfortable for him to lay on his back.  Overnight the on-call team was paged about the patient having sinus tachycardia with a rate in the 140s.  The EKG at that time showed ST depression in the inferior lateral leads.  Patient was given 5 mg of IV metoprolol and the patient regained normal sinus rhythm and 5 minutes and the ST depressions had resolved on repeat EKG.  Patient's bladder scan had greater than 850 mL.  Foley cath was ordered but not able to be placed and instead code catheter was placed.  Objective:  Vital signs in last 24 hours: Vitals:   10/18/17 0325 10/18/17 0330 10/18/17 0538 10/18/17 0640  BP:   (!) 147/58   Pulse:  78 83   Resp:   17   Temp:   99 F (37.2 C)   TempSrc:   Oral   SpO2: 90% 98% 99% 96%  Weight:      Height:       Physical Exam  Constitutional: He appears well-developed and well-nourished. He appears lethargic.  HENT:  Head: Normocephalic and atraumatic.  Eyes: Conjunctivae are normal.  Cardiovascular: Normal rate, regular rhythm and intact distal pulses.  Murmur heard. Pulmonary/Chest: Effort normal and breath sounds normal. No stridor. No respiratory distress.  Abdominal: Soft. Bowel sounds are normal. He exhibits no distension. There is no tenderness (did not have the umbilical pain today).  Neurological: He appears lethargic.  Psychiatric: He has a normal mood and affect. His behavior is normal. Judgment and thought content normal.   Assessment/Plan:  Small bowel obstruction Incarcerated umbilical hernia with SBO The patient presented following a CT scan done yesterday  10/17/2017 showed high-grade small bowel obstruction caused by a small umbilical hernia containing a portion of the small bowel.  -Surgery is following the patient and stated that he will undergo surgery for removal of the obstruction next week.  -Changed the diet from NPO to clear liquid  -Phenergan 12.5mg  q6hrs prn for nausea -Zosyn 3.375g 100 ml/hr -Monitor I/O -Placed on heparin   Supraventricular tachycardia The patient has a history of svt's and has been having episodes of svt's since admission.  -Resumed home medication of metoprolol 50 mg twice daily, hydralazine 10 mg every 8 hours, diltiazem 360 XT daily, digoxin 0.25 mg daily at home.  -Continue cardiac monitoring  Urinary Retention: -Placed on coude cath -continued home doxazosin 2mg  qd  Hypertension His blood pressure and since admission has ranged 122-186/65-71. She takes metoprolol 50 mg twice daily, hydralazine 10 mg every 8 hours, diltiazem 360 XT daily, digoxin 0.25 mg daily at home. -Resumed home medication  Diabetes mellitus -CBG monitoring  Dispo: Anticipated discharge in approximately 3-4 day(s).   Nicholas CourierVahini Ross Hefferan, MD Internal Medicine PGY1 [phone] 10/18/2017, 11:09 AM

## 2017-10-18 NOTE — Progress Notes (Signed)
Paged by nurse that patient is having sinus tachycardia with rate around 140, EKG shows ST depression in inferior lateral leads with sinus tachycardia, patient has an history of paroxysmal SVT.  He remained hemodynamically stable with complaint of mild palpitations.  5 mg IV metoprolol was given, patient regained normal sinus rhythm within 5 minutes and ST depressions resolved on repeat EKG.

## 2017-10-18 NOTE — Progress Notes (Signed)
CC:   Abdominal pain, nausea and vomiting  Subjective: He looks and feels better this AM.  Stomach is not distended now, no pain or nausea. He did have urinary retention and recurrent bouts of SVT/PVC.  SVT is pretty regular, I don't think he has AF in the strips I looked at.   Objective: Vital signs in last 24 hours: Temp:  [97.9 F (36.6 C)-99 F (37.2 C)] 99 F (37.2 C) (11/09 0538) Pulse Rate:  [66-155] 83 (11/09 0538) Resp:  [14-20] 17 (11/09 0538) BP: (122-186)/(51-89) 147/58 (11/09 0538) SpO2:  [90 %-99 %] 96 % (11/09 0640) Weight:  [81.2 kg (179 lb)-85.5 kg (188 lb 7.9 oz)] 85.5 kg (188 lb 7.9 oz) (11/08 1817) Last BM Date: 10/17/17 NPO 1410 IV 1375 urine BM x 4 Tm 99.3, recurrent bouts of tachycardia up into the 150 range WBC up some this AM     Intake/Output from previous day: 11/08 0701 - 11/09 0700 In: 1410 [I.V.:860; IV Piggyback:550] Out: 1375 [Urine:1375] Intake/Output this shift: Total I/O In: -  Out: 300 [Urine:300]  General appearance: alert, cooperative, no distress and appears sleepy this AM Resp: clear to auscultation bilaterally GI: soft, not distened, bowel sounds hypoacitive.    Lab Results:  Recent Labs    10/17/17 1443 10/17/17 2335  WBC 10.2 11.5*  HGB 14.9 13.5  HCT 42.8 38.7*  PLT 406* 375    BMET Recent Labs    10/17/17 1443 10/17/17 2335  NA 136 138  K 4.0 3.8  CL 96* 102  CO2 29 28  GLUCOSE 130* 103*  BUN 14 12  CREATININE 1.43* 1.21  CALCIUM 10.1 9.0   PT/INR Recent Labs    10/17/17 1642  LABPROT 14.4  INR 1.13    Recent Labs  Lab 10/17/17 1443 10/17/17 2335  AST 32 31  ALT 33 26  ALKPHOS 99 82  BILITOT 0.9 1.0  PROT 8.0 6.2*  ALBUMIN 4.6 3.7     Lipase     Component Value Date/Time   LIPASE 21 10/17/2017 1443     Medications: . heparin  5,000 Units Subcutaneous Q8H  . sodium chloride flush  3 mL Intravenous Q12H   Add IV fluids Anti-infectives (From admission, onward)   Start      Dose/Rate Route Frequency Ordered Stop   10/17/17 1630  piperacillin-tazobactam (ZOSYN) IVPB 3.375 g     3.375 g 100 mL/hr over 30 Minutes Intravenous  Once 10/17/17 1621 10/17/17 1716      Assessment/Plan Incarcerated Umbilical hernia with SBO - finally reduced in the ED Chronic illness with 100 lb weight loss since April 2018 Recent In Pt rehab for lumbar radiculopathy SVT last PM Urinary retention - foley in place CAD/Hx of MI/history of PSVT  -  On Plavix last dose 10/16/17 Aortic and mitral regurgitation./ EF 60-65% 03/2017 Echo Carotid stenosis and occlusion with hx of CVA PVOD - s/p Left Fem-pop BPG Type II diabetes Hypertension Probable malnutrition and deconditioning Hx of tobacco use FEN: No IV fluids, I wills start them/ NPO =>> start clears ID:  1 dose of Zosyn started for pre op - then surgery held off with reduction of hernia bedside. DVT:  Heparin Foley: placed for urinary retention last PM Follow up:  TBD   Plan:  I would start him on clears and see how he does.  Allow Medicine to get him tuned up and ready/ cleared for surgery hopefully early next week.    LOS: 0  days    Massie Cogliano 10/18/2017 [phone]

## 2017-10-18 NOTE — Progress Notes (Signed)
@  0155 paged on-call regarding pt's bladders can >83550mL. Page returned and Foley ordered.  @0225Unable  to place Foley, paged MD for Coude order. Order received and Consulting civil engineerCharge RN Julieanne Cotton(Josephine) placed. 925mL emptied. Pt endorsed near immediate relief. Will continue to monitor.

## 2017-10-18 NOTE — Progress Notes (Signed)
  Date: 10/18/2017  Patient name: Nicholas [name] Fielding  Medical record number: [id number]  Date of birth: 06/02/1951   I have seen and evaluated Nicholas [name] Reichenberger and discussed their care with the Residency Team.  In brief, patient is a 66 year old male with a past medical history of hypertension, CAD status post PCI with stent to the LAD, diabetes who presented with a diagnosis of small bowel obstruction on an outpatient CT.  Patient is being worked up as an outpatient for an approximately unintentional 100 pound weight loss over the last 3 months and had an outpatient CT done on October 17, 2017 which showed a high-grade small bowel obstruction caused by an umbilical hernia.  Patient states that over the last week he has been having worsening diffuse abdominal pain which is 10 out of 10, sharp, nonradiating, worse in the periumbilical area and associated with nausea and vomiting for the last day.  Vomitus contained food particles.  No hematemesis.  No chest pain, no shortness of breath, no fevers or chills, no lightheadedness, no focal weakness, no headache, no palpitations, no diaphoresis, no diarrhea.  Today patient feels better and states his nausea and vomiting have resolved with this abdominal pain is resolved as well.  He was seen in the ED yesterday by surgery who reduced his umbilical hernia.  PMHx, Fam Hx, and/or Soc Hx : As per resident admit note  Vitals:   10/18/17 0640 10/18/17 1249  BP:  (!) 144/57  Pulse:  80  Resp:    Temp:    SpO2: 96% 98%   General: Awake alert and oriented x3, NAD CVS: Regular rate and rhythm, systolic murmur noted Lungs: CTA bilaterally Abdomen: Soft, nontender, nondistended, normoactive bowel sounds, no visible umbilical hernia on exam Extremities: No edema noted  Assessment and Plan: I have seen and evaluated the patient as outlined above. I agree with the formulated Assessment and Plan as detailed in the residents' note, with the following changes:    1.  High-grade small bowel obstruction: -Patient presented to the ED after being diagnosed with a small bowel obstruction on an outpatient CAT scan in the setting of abdominal pain over the last week and nausea and vomiting over the last day.  This is likely secondary to an umbilical hernia containing a portion of small bowel. -Surgery follow-up and recommendations appreciated -They reduced the hernia yesterday and the symptoms seem to have resolved -He is scheduled for surgery early next week -Advance diet to clear liquids today -Continue with Phenergan as needed before nausea but patient has not required this today at all -Continue with IV fluids for hydration given patient is only allowed clears today -Hold Plavix in anticipation of surgery next week  2.  Supraventricular tachycardia: -Patient has been off his home metoprolol and diltiazem as well as digoxin as he was n.p.o. for his small bowel obstruction. -He has been having intermittent episodes of tachycardia which appear regular and consistent with his prior diagnosis of SVT -We will resume his home medications of metoprolol, diltiazem and digoxin today and continue to monitor on telemetry -If his SVT persists would consider cardiology consult  Earl LagosNarendra, Weltha Cathy, MD 11/9/20181:59 PM

## 2017-10-19 LAB — BASIC METABOLIC PANEL
ANION GAP: 6 (ref 5–15)
BUN: 5 mg/dL — AB (ref 6–20)
CALCIUM: 8.5 mg/dL — AB (ref 8.9–10.3)
CO2: 27 mmol/L (ref 22–32)
Chloride: 104 mmol/L (ref 101–111)
Creatinine, Ser: 1.04 mg/dL (ref 0.61–1.24)
GFR calc Af Amer: >60 mL/min (ref >60)
GLUCOSE: 114 mg/dL — AB (ref 65–99)
Potassium: 3.6 mmol/L (ref 3.5–5.1)
SODIUM: 137 mmol/L (ref 135–145)

## 2017-10-19 LAB — GLUCOSE, CAPILLARY: Glucose-Capillary: 140 mg/dL — ABNORMAL HIGH (ref 65–99)

## 2017-10-19 LAB — MAGNESIUM: MAGNESIUM: 1.9 mg/dL (ref 1.7–2.4)

## 2017-10-19 MED ORDER — ENSURE ENLIVE PO LIQD
237.0000 mL | Freq: Two times a day (BID) | ORAL | Status: DC
Start: 2017-10-19 — End: 2017-10-29
  Administered 2017-10-19 – 2017-10-28 (×18): 237 mL via ORAL

## 2017-10-19 MED ORDER — DILTIAZEM HCL ER COATED BEADS 180 MG PO CP24
360.0000 mg | ORAL_CAPSULE | Freq: Every day | ORAL | Status: DC
  Administered 2017-10-19 – 2017-10-28 (×9): 360 mg via ORAL
  Filled 2017-10-19 (×9): qty 2

## 2017-10-19 NOTE — Progress Notes (Signed)
Patient ID: Nicholas [name], male   DOB: 11/23/1951, 66 y.o.   MRN: [id number]  [facility]     Subjective: CC-  Up in chair. Main complaint this morning is that he does not like lying in the bed. Denies any current abdominal pain. Tolerating clear liquids. Denies n/v. Passing small amount of flatus. No BM.  Objective: Vital signs in last 24 hours: Temp:  [98 F (36.7 C)-98.4 F (36.9 C)] 98.4 F (36.9 C) (11/10 0443) Pulse Rate:  [63-80] 63 (11/10 0443) Resp:  [20] 20 (11/10 0443) BP: (131-144)/(46-57) 140/56 (11/10 0600) SpO2:  [97 %-98 %] 97 % (11/10 0443) Last BM Date: 10/18/17  Intake/Output from previous day: 11/09 0701 - 11/10 0700 In: 910 [P.O.:260; I.V.:650] Out: 3350 [Urine:3350] Intake/Output this shift: No intake/output data recorded.  PE: Gen:  Alert, NAD HEENT: EOM's intact, pupils equal and round Card:  RRR, no M/G/R heard Pulm:  CTAB, no W/R/R, effort normal Abd: Soft, NT/ND, +BS Ext:  No erythema, edema, or tenderness BUE/BLE  Psych: A&Ox3  Skin: no rashes noted, warm and dry  Lab Results:  Recent Labs    10/17/17 1443 10/17/17 2335  WBC 10.2 11.5*  HGB 14.9 13.5  HCT 42.8 38.7*  PLT 406* 375   BMET Recent Labs    10/17/17 1443 10/17/17 2335  NA 136 138  K 4.0 3.8  CL 96* 102  CO2 29 28  GLUCOSE 130* 103*  BUN 14 12  CREATININE 1.43* 1.21  CALCIUM 10.1 9.0   PT/INR Recent Labs    10/17/17 1642  LABPROT 14.4  INR 1.13   CMP     Component Value Date/Time   NA 138 10/17/2017 2335   K 3.8 10/17/2017 2335   CL 102 10/17/2017 2335   CO2 28 10/17/2017 2335   GLUCOSE 103 (H) 10/17/2017 2335   BUN 12 10/17/2017 2335   CREATININE 1.21 10/17/2017 2335   CALCIUM 9.0 10/17/2017 2335   PROT 6.2 (L) 10/17/2017 2335   ALBUMIN 3.7 10/17/2017 2335   AST 31 10/17/2017 2335   ALT 26 10/17/2017 2335   ALKPHOS 82 10/17/2017 2335   BILITOT 1.0 10/17/2017 2335   GFRNONAA >60 10/17/2017 2335   GFRAA >60  10/17/2017 2335   Lipase     Component Value Date/Time   LIPASE 21 10/17/2017 1443       Studies/Results: Ct Abdomen Pelvis W Contrast  Result Date: 10/17/2017 CLINICAL DATA:  66 year old male with abdominal and pelvic pain for several months with vomiting. Unexplained weight loss. EXAM: CT ABDOMEN AND PELVIS WITH CONTRAST TECHNIQUE: Multidetector CT imaging of the abdomen and pelvis was performed using the standard protocol following bolus administration of intravenous contrast. CONTRAST:  100mL ISOVUE-300 IOPAMIDOL (ISOVUE-300) INJECTION 61% COMPARISON:  10/09/2015 and prior CTs FINDINGS: Lower chest: No acute abnormality Hepatobiliary: The liver and gallbladder are unremarkable. No biliary dilatation. Pancreas: Unremarkable Spleen: Unremarkable Adrenals/Urinary Tract: The kidneys, adrenal glands and bladder are unremarkable. Stomach/Bowel: Dilated stomach and proximal - mid small bowel loops are noted with collapsed distal small bowel loops compatible with high-grade small bowel obstruction. Obstruction point is located at a small umbilical hernia containing a portion of small bowel. There is no evidence of ascites, abscess or pneumoperitoneum. No other bowel abnormalities are identified. Vascular/Lymphatic: Aortic atherosclerosis. No enlarged abdominal or pelvic lymph nodes. Reproductive: Prostate is unremarkable. Other: No abdominal wall hernia or abnormality. No abdominopelvic ascites. Musculoskeletal: No acute or suspicious abnormalities identified. IMPRESSION: 1. High-grade small bowel  obstruction caused by a small umbilical hernia containing a portion of small bowel. No evidence of ascites, pneumoperitoneum or abscess. 2.  Aortic Atherosclerosis (ICD10-I70.0). Critical Value/emergent results were called by telephone at the time of interpretation on 10/17/2017 at 12:42 pm to [name], nurse with Dr. Leilani AbleBETTI [name] , who verbally acknowledged these results. Electronically Signed   By: Harmon PierJeffrey   Hu M.D.   On: 10/17/2017 12:43   Dg Chest Port 1 View  Result Date: 10/17/2017 CLINICAL DATA:  Preoperative evaluation, history of small-bowel obstruction EXAM: PORTABLE CHEST 1 VIEW COMPARISON:  04/26/2014 FINDINGS: Cardiac shadow is stable. The lungs are well aerated bilaterally. No focal infiltrate or sizable effusion is seen. No bony abnormality is noted. IMPRESSION: No active disease. Electronically Signed   By: Alcide CleverMark  Lukens M.D.   On: 10/17/2017 16:34    Anti-infectives: Anti-infectives (From admission, onward)   Start     Dose/Rate Route Frequency Ordered Stop   10/17/17 1630  piperacillin-tazobactam (ZOSYN) IVPB 3.375 g     3.375 g 100 mL/hr over 30 Minutes Intravenous  Once 10/17/17 1621 10/17/17 1716       Assessment/Plan Incarcerated Umbilical hernia with SBO - reduced in the ED Chronic illness with 100 lb weight loss since April 2018 Recent In Pt rehab for lumbar radiculopathy SVT this admission - on home meds metoprolol, diltiazem and digoxin. Consider cardiology consult if SVT persists Urinary retention - foley in place CAD/Hx ofMI/history of PSVT - holding Plavix last dose 10/16/17 Aortic and mitral regurgitation./ EF 60-65% 03/2017 Echo Carotid stenosis and occlusion with hx of CVA PVOD - s/p Left Fem-pop BPG Type II diabetes Hypertension Probable malnutrition and deconditioning Hx of tobacco use  FEN: IVF, CLD, Ensure ID:  1 dose of Zosyn 11/8 started for pre op - then surgery held off with reduction of hernia bedside. DVT:  Heparin Foley: placed for urinary retention Follow up:  TBD  Plan:  Continue clear liquids. Add Ensure. Ok to get OOB. Continue to hold plavix. Surgery hopefully next week once medically stable and off plavix for several days.   LOS: 1 day    [name] , [facility] 10/19/2017, 8:54 AM Pager: [phone] Consults: [phone] Mon-Fri 7:00 am-4:30 pm Sat-Sun 7:00 am-11:30 am

## 2017-10-19 NOTE — Progress Notes (Addendum)
   Subjective:   No acute events overnight. Denies n/v/abd pain. Was able to tolerate clear liquids. Passing flatus but no BM. Tele review does not show any SVTS further.   Objective:  Vital signs in last 24 hours: Vitals:   10/18/17 1249 10/18/17 2310 10/19/17 0443 10/19/17 0600  BP: (!) 144/57 (!) 131/51 (!) 137/46 (!) 140/56  Pulse: 80 68 63   Resp:  20 20   Temp: 98 F (36.7 C) 98.4 F (36.9 C) 98.4 F (36.9 C)   TempSrc: Oral Oral Oral   SpO2: 98% 98% 97%   Weight:      Height:        General: Vital signs reviewed. Patient in no acute distress Cardiovascular: regular rate, rhythm, systolic murmur  Pulmonary/Chest: Clear to auscultation bilaterally, no wheezes, rales, or rhonchi. Abdominal: Soft, non-tender, non-distended, BS + Extremities: No lower extremity edema bilaterally GU: has foley catheter      Assessment/Plan:  High grade Small bowel obstruction: Patient presented to the ED after being diagnosed with a small bowel obstruction on an outpatient CAT scan in the setting of abdominal pain over the last week and nausea and vomiting over the last day.  This is likely secondary to an umbilical hernia containing a portion of small bowel. He is tolerating clear liquids and denies n/v, and passing small flatus. No BM today.  -surgery following and appreciate recs, OOB in chair today. Surgery planned for early next week. -on clear liquids, and ensure added -Phenergan 12.5mg  q6hrs prn for nausea -continue gentle IV fluid hydration -Monitor I/O -holding plavix   Supraventricular tachycardia: The patient has a history of svt's and was having episodes of svt's yesterday. Home medications of metoprolol 50 mg twice daily, hydralazine 10 mg every 8 hours, diltiazem 360 XT daily, digoxin 0.25 mg were restarted. Tele review this morning shows no SVTs- there were some PVCs, and currently in NSR  -Continue cardiac monitoring -If further episodes of SVT then will consult  cardiology    Urinary Retention: - Was placed on coude cath. Urine output 3350 ml yesterday -continued home doxazosin 2mg  qd  Hypertension: His blood pressure and since admission has ranged 122-186/65-71. She takes metoprolol 50 mg twice daily, hydralazine 10 mg every 8 hours, diltiazem 360 XT daily, digoxin 0.25 mg daily at home.  -Resumed home medications  Diabetes mellitus: CBGs have been stable  -CBG monitoring  Hypocalcemia- Ca of 8.5 noted today. Review of medications do not show any meds that may be contributing to it.  -checking mag and cmet tomorrow -will continue to monitor- with daily BMETs  Dispo: Anticipated discharge in approximately 3-4 day(s).    Deneise LeverParth Lakoda Raske, MD  [facility] 10/19/2017, 9:11 AM

## 2017-10-20 DIAGNOSIS — R011 Cardiac murmur, unspecified: Secondary | ICD-10-CM

## 2017-10-20 DIAGNOSIS — K420 Umbilical hernia with obstruction, without gangrene: Principal | ICD-10-CM

## 2017-10-20 LAB — COMPREHENSIVE METABOLIC PANEL
ALBUMIN: 2.9 g/dL — AB (ref 3.5–5.0)
ALT: 20 U/L (ref 17–63)
AST: 24 U/L (ref 15–41)
Alkaline Phosphatase: 72 U/L (ref 38–126)
Anion gap: 5 (ref 5–15)
BUN: 5 mg/dL — ABNORMAL LOW (ref 6–20)
CALCIUM: 8.5 mg/dL — AB (ref 8.9–10.3)
CHLORIDE: 107 mmol/L (ref 101–111)
CO2: 28 mmol/L (ref 22–32)
CREATININE: 1 mg/dL (ref 0.61–1.24)
GFR calc Af Amer: >60 mL/min (ref >60)
GFR calc non Af Amer: >60 mL/min (ref >60)
GLUCOSE: 88 mg/dL (ref 65–99)
Potassium: 3.8 mmol/L (ref 3.5–5.1)
SODIUM: 140 mmol/L (ref 135–145)
Total Bilirubin: 1 mg/dL (ref 0.3–1.2)
Total Protein: 5.1 g/dL — ABNORMAL LOW (ref 6.5–8.1)

## 2017-10-20 LAB — DIGOXIN LEVEL: DIGOXIN LVL: 0.7 ng/mL — AB (ref 0.8–2.0)

## 2017-10-20 LAB — GLUCOSE, CAPILLARY: Glucose-Capillary: 79 mg/dL (ref 65–99)

## 2017-10-20 MED ORDER — CEFAZOLIN SODIUM-DEXTROSE 2-4 GM/100ML-% IV SOLN
2.0000 g | INTRAVENOUS | Status: AC
  Administered 2017-10-21: 2 g via INTRAVENOUS
  Filled 2017-10-20: qty 100

## 2017-10-20 NOTE — Progress Notes (Addendum)
   Subjective:   No acute events overnight. Patient up in the chair. Denies n/v/abd pain. Was able to tolerate clear liquids and ensure. Passing flatus but no BM. Tele review does not show any SVTS further.   Objective:  Vital signs in last 24 hours: Vitals:   10/19/17 2049 10/19/17 2056 10/19/17 2057 10/20/17 0611  BP: (!) 124/48 (!) 124/48 (!) 124/48 (!) 128/46  Pulse: 65  65 63  Resp: 19   18  Temp: 98.1 F (36.7 C)   98.3 F (36.8 C)  TempSrc: Oral   Oral  SpO2: 98%   97%  Weight:      Height:        General: Vital signs reviewed. Patient in no acute distress. Pt sitting in the chair with the breakfast Cardiovascular: regular rate, rhythm, systolic murmur  Pulmonary/Chest: Clear to auscultation bilaterally, no wheezes, rales, or rhonchi. Abdominal: Soft, non-tender, non-distended, BS + Extremities: No lower extremity edema bilaterally GU: has foley catheter      Assessment/Plan:  High grade Small bowel obstruction: Patient presented to the ED after being diagnosed with a small bowel obstruction on an outpatient CAT scan in the setting of abdominal pain over the last week and nausea and vomiting over the last day.  This is likely secondary to an umbilical hernia containing a portion of small bowel. He is tolerating clear liquids and denies n/v, and passing small flatus. No BM today.  -surgery following and appreciate recs, OOB in chair. Surgery planned for early next week. -on clear liquids, and ensure added -Phenergan 12.5mg  q6hrs prn for nausea -continue gentle IV fluid hydration- as he is not having good po intake- will continue D5 NS with 20 meq kcl at 100 cc/hr -Monitor I/O -holding plavix   Supraventricular tachycardia: The patient has a history of svt's and was having episodes of svt's on arrival. Home medications of metoprolol 50 mg twice daily, hydralazine 10 mg every 8 hours, diltiazem 360 XT daily, digoxin 0.25 mg were restarted. Tele review this morning  shows no SVTs- there were some PVCs, and currently in NSR  -Continue cardiac monitoring -If further episodes of SVT then will consult cardiology    Urinary Retention: - Was placed on coude cath. Urine output 2900 ml yesterday. This afternoon, was paged that pt had some pus around the catheter and some bleeding. Went up and examined- no pus, but does have some blood in the foley. No active bleeding. Since the pt was able to void by himself prior to being admitted, will do a trial without foley- and allow pt to void-   -removed foley, asked nurse to see if pt has voided in 8 hours- bladder scan, and I&O if pt unable to void.  -continued home doxazosin 2mg  qd  Hypertension: His blood pressure and since admission has ranged 122-186/65-71. He takes metoprolol 50 mg twice daily, hydralazine 10 mg every 8 hours, diltiazem 360 XT daily, digoxin 0.25 mg daily at home.  -Resumed home medications  Diabetes mellitus: CBGs have been stable  -CBG monitoring  Hypocalcemia- Ca of 8.5 noted. Review of medications do not show any meds that may be contributing to it. -Corrected calcium for low albumin is within normal limits.   Dispo: Anticipated discharge in approximately 3-4 day(s).    Deneise LeverParth Bilaal Leib, MD  [facility] 10/20/2017, 10:00 AM

## 2017-10-20 NOTE — Progress Notes (Addendum)
Patient complains of pain from catheter.  Tried to advance slightly and deflated balloon then re-inflated to see if pain lessened.  Pus was present at insertion site.  Drainage in line appeared blood tinged.  Paged MD on call.  MD responded and will be up to see patient.  Will continue to monitor.

## 2017-10-20 NOTE — Progress Notes (Signed)
  Date: 10/20/2017  Patient name: Nicholas [name]  Medical record number: [id number]  Date of birth: 07/25/1951   I have seen and evaluated this patient and I have discussed the plan of care with the house staff. Please see their note for complete details. I concur with their findings with the following additions/corrections: Sitting in recliner. Denies ABD pain. States good appetite. Might go to OR tomorrow for hernia repair.   Burns SpainButcher, Danaye Sobh A, MD 10/20/2017, 12:12 PM

## 2017-10-20 NOTE — Progress Notes (Signed)
Patient ID: Nicholas [name], male   DOB: 03/20/1951, 66 y.o.   MRN: [id number]  [facility]     Subjective: CC-  No complaints this morning. Patient denies abdominal pain, n/v. He is tolerating clears and passing flatus. No BM.  Currently in NSR.  Objective: Vital signs in last 24 hours: Temp:  [98 F (36.7 C)-98.3 F (36.8 C)] 98.3 F (36.8 C) (11/11 0611) Pulse Rate:  [58-65] 63 (11/11 0611) Resp:  [18-20] 18 (11/11 0611) BP: (106-128)/(39-48) 128/46 (11/11 0611) SpO2:  [97 %-98 %] 97 % (11/11 0611) Last BM Date: 10/19/17  Intake/Output from previous day: 11/10 0701 - 11/11 0700 In: 180 [P.O.:180] Out: 2900 [Urine:2900] Intake/Output this shift: No intake/output data recorded.  PE: Gen:  Alert, NAD HEENT: EOM's intact, pupils equal and round Card:  RRR, no M/G/R heard Pulm:  CTAB, no W/R/R, effort normal Abd: Soft, NT/ND, +BS, umbilical hernia soft with no overlying erythema and nontender Ext:  No erythema, edema, or tenderness BUE/BLE  Psych: A&Ox3  Skin: no rashes noted, warm and dry   Lab Results:  Recent Labs    10/17/17 1443 10/17/17 2335  WBC 10.2 11.5*  HGB 14.9 13.5  HCT 42.8 38.7*  PLT 406* 375   BMET Recent Labs    10/19/17 0749 10/20/17 0358  NA 137 140  K 3.6 3.8  CL 104 107  CO2 27 28  GLUCOSE 114* 88  BUN 5* 5*  CREATININE 1.04 1.00  CALCIUM 8.5* 8.5*   PT/INR Recent Labs    10/17/17 1642  LABPROT 14.4  INR 1.13   CMP     Component Value Date/Time   NA 140 10/20/2017 0358   K 3.8 10/20/2017 0358   CL 107 10/20/2017 0358   CO2 28 10/20/2017 0358   GLUCOSE 88 10/20/2017 0358   BUN 5 (L) 10/20/2017 0358   CREATININE 1.00 10/20/2017 0358   CALCIUM 8.5 (L) 10/20/2017 0358   PROT 5.1 (L) 10/20/2017 0358   ALBUMIN 2.9 (L) 10/20/2017 0358   AST 24 10/20/2017 0358   ALT 20 10/20/2017 0358   ALKPHOS 72 10/20/2017 0358   BILITOT 1.0 10/20/2017 0358   GFRNONAA >60 10/20/2017 0358   GFRAA >60  10/20/2017 0358   Lipase     Component Value Date/Time   LIPASE 21 10/17/2017 1443       Studies/Results: No results found.  Anti-infectives: Anti-infectives (From admission, onward)   Start     Dose/Rate Route Frequency Ordered Stop   10/17/17 1630  piperacillin-tazobactam (ZOSYN) IVPB 3.375 g     3.375 g 100 mL/hr over 30 Minutes Intravenous  Once 10/17/17 1621 10/17/17 1716       Assessment/Plan Incarcerated Umbilical hernia with SBO-reduced in the ED, passing flatus and abdominal pain resolved Chronic illness with 100 lb weight loss since April 2018 Recent In Pt rehab for lumbar radiculopathy SVT this admission - on home meds metoprolol, diltiazem and digoxin. Consider cardiology consult if SVT persists. Currently NSR Urinary retention - foley in place CAD/Hx ofMI/history of PSVT - holding Plavix last dose 10/16/17 Aortic and mitral regurgitation./ EF 60-65% 03/2017 Echo Carotid stenosis and occlusion with hx of CVA PVOD - s/p Left Fem-pop BPG Type II diabetes Hypertension Probable malnutrition and deconditioning Hx of tobacco use  FEN: IVF, CLD, Ensure ID: 1 dose of Zosyn 11/8 started for pre op - then surgery held off with reduction of hernia bedside. WUJ:[id number]:Heparin Foley:placed for urinary retention Follow up: TBD  Plan: Continue clear liquids and Ensure today. Tomorrow will be 5 days off plavix. Will make NPO after midnight and discuss with MD tomorrow if ready to proceed with surgery.     LOS: 2 days    Franne FortsBROOKE A MEUTH , [facility] 10/20/2017, 10:01 AM Pager: [phone] Consults: [phone] Mon-Fri 7:00 am-4:30 pm Sat-Sun 7:00 am-11:30 am

## 2017-10-20 NOTE — Progress Notes (Signed)
Coude catheter removed at 1400 per MD Johnny BridgeSaraiya.  Patient has since been incontinent multiple times with blood still present in urine and passing clots with one occurrence.  Will continue to monitor and will advise next shift nurse to observe output.

## 2017-10-21 ENCOUNTER — Inpatient Hospital Stay (HOSPITAL_COMMUNITY): Payer: Medicare Other | Admitting: Certified Registered"

## 2017-10-21 ENCOUNTER — Encounter (HOSPITAL_COMMUNITY)
Admission: EM | Disposition: A | Discharge status: Home / Self Care | Payer: Self-pay | Source: Home / Self Care | Attending: Internal Medicine

## 2017-10-21 ENCOUNTER — Encounter (HOSPITAL_COMMUNITY): Payer: Self-pay | Admitting: Certified Registered"

## 2017-10-21 DIAGNOSIS — Z9889 Other specified postprocedural states: Secondary | ICD-10-CM

## 2017-10-21 HISTORY — PX: UMBILICAL HERNIA REPAIR: SHX196

## 2017-10-21 LAB — BASIC METABOLIC PANEL
ANION GAP: 6 (ref 5–15)
BUN: 5 mg/dL — ABNORMAL LOW (ref 6–20)
CALCIUM: 8.5 mg/dL — AB (ref 8.9–10.3)
CO2: 27 mmol/L (ref 22–32)
CREATININE: 0.91 mg/dL (ref 0.61–1.24)
Chloride: 105 mmol/L (ref 101–111)
Glucose, Bld: 102 mg/dL — ABNORMAL HIGH (ref 65–99)
Potassium: 3.8 mmol/L (ref 3.5–5.1)
Sodium: 138 mmol/L (ref 135–145)

## 2017-10-21 LAB — GLUCOSE, CAPILLARY
GLUCOSE-CAPILLARY: 101 mg/dL — AB (ref 65–99)
GLUCOSE-CAPILLARY: 83 mg/dL (ref 65–99)

## 2017-10-21 LAB — SURGICAL PCR SCREEN
MRSA, PCR: NEGATIVE
STAPHYLOCOCCUS AUREUS: POSITIVE — AB

## 2017-10-21 SURGERY — REPAIR, HERNIA, UMBILICAL, ADULT
Anesthesia: General | Site: Abdomen

## 2017-10-21 MED ORDER — LIDOCAINE 2% (20 MG/ML) 5 ML SYRINGE
INTRAMUSCULAR | Status: DC | PRN
  Administered 2017-10-21: 60 mg via INTRAVENOUS

## 2017-10-21 MED ORDER — MUPIROCIN 2 % EX OINT
TOPICAL_OINTMENT | Freq: Two times a day (BID) | CUTANEOUS | Status: DC
  Administered 2017-10-21: 22:00:00 via NASAL
  Administered 2017-10-22: 1 via NASAL
  Administered 2017-10-22 – 2017-10-26 (×8): via NASAL
  Administered 2017-10-26: 1 via NASAL
  Administered 2017-10-27 – 2017-10-28 (×3): via NASAL
  Filled 2017-10-21 (×4): qty 22

## 2017-10-21 MED ORDER — HYDROMORPHONE HCL 1 MG/ML IJ SOLN
0.2500 mg | INTRAMUSCULAR | Status: DC | PRN
  Administered 2017-10-21: 0.5 mg via INTRAVENOUS

## 2017-10-21 MED ORDER — ONDANSETRON HCL 4 MG/2ML IJ SOLN
INTRAMUSCULAR | Status: AC
  Filled 2017-10-21: qty 2

## 2017-10-21 MED ORDER — EPHEDRINE SULFATE-NACL 50-0.9 MG/10ML-% IV SOSY
PREFILLED_SYRINGE | INTRAVENOUS | Status: DC | PRN
  Administered 2017-10-21: 10 mg via INTRAVENOUS
  Administered 2017-10-21 (×2): 5 mg via INTRAVENOUS

## 2017-10-21 MED ORDER — SUCCINYLCHOLINE CHLORIDE 200 MG/10ML IV SOSY
PREFILLED_SYRINGE | INTRAVENOUS | Status: AC
  Filled 2017-10-21: qty 10

## 2017-10-21 MED ORDER — SUGAMMADEX SODIUM 200 MG/2ML IV SOLN
INTRAVENOUS | Status: DC | PRN
  Administered 2017-10-21: 200 mg via INTRAVENOUS

## 2017-10-21 MED ORDER — LACTATED RINGERS IV SOLN
INTRAVENOUS | Status: DC
  Administered 2017-10-24 – 2017-10-25 (×3): via INTRAVENOUS

## 2017-10-21 MED ORDER — HYDROMORPHONE HCL 1 MG/ML IJ SOLN
INTRAMUSCULAR | Status: AC
  Filled 2017-10-21: qty 1

## 2017-10-21 MED ORDER — SUGAMMADEX SODIUM 200 MG/2ML IV SOLN
INTRAVENOUS | Status: AC
  Filled 2017-10-21: qty 2

## 2017-10-21 MED ORDER — 0.9 % SODIUM CHLORIDE (POUR BTL) OPTIME
TOPICAL | Status: DC | PRN
  Administered 2017-10-21: 1000 mL

## 2017-10-21 MED ORDER — PHENYLEPHRINE 40 MCG/ML (10ML) SYRINGE FOR IV PUSH (FOR BLOOD PRESSURE SUPPORT)
PREFILLED_SYRINGE | INTRAVENOUS | Status: AC
  Filled 2017-10-21: qty 10

## 2017-10-21 MED ORDER — MORPHINE SULFATE (PF) 4 MG/ML IV SOLN: 2.0000 mg | INTRAVENOUS | Status: DC | PRN

## 2017-10-21 MED ORDER — METOCLOPRAMIDE HCL 5 MG/ML IJ SOLN: 10.0000 mg | Freq: Once | INTRAMUSCULAR | Status: DC | PRN

## 2017-10-21 MED ORDER — LIDOCAINE 2% (20 MG/ML) 5 ML SYRINGE
INTRAMUSCULAR | Status: AC
  Filled 2017-10-21: qty 5

## 2017-10-21 MED ORDER — OXYCODONE HCL 5 MG PO TABS
5.0000 mg | ORAL_TABLET | ORAL | Status: DC | PRN
  Administered 2017-10-22 – 2017-10-24 (×3): 5 mg via ORAL
  Filled 2017-10-21 (×3): qty 1

## 2017-10-21 MED ORDER — PROPOFOL 10 MG/ML IV BOLUS
INTRAVENOUS | Status: DC | PRN
  Administered 2017-10-21: 120 mg via INTRAVENOUS

## 2017-10-21 MED ORDER — BUPIVACAINE-EPINEPHRINE (PF) 0.25% -1:200000 IJ SOLN
INTRAMUSCULAR | Status: AC
  Filled 2017-10-21: qty 30

## 2017-10-21 MED ORDER — FENTANYL CITRATE (PF) 100 MCG/2ML IJ SOLN
INTRAMUSCULAR | Status: DC | PRN
  Administered 2017-10-21: 100 ug via INTRAVENOUS
  Administered 2017-10-21: 50 ug via INTRAVENOUS

## 2017-10-21 MED ORDER — MEPERIDINE HCL 25 MG/ML IJ SOLN: 6.2500 mg | INTRAMUSCULAR | Status: DC | PRN

## 2017-10-21 MED ORDER — ONDANSETRON HCL 4 MG/2ML IJ SOLN
INTRAMUSCULAR | Status: DC | PRN
  Administered 2017-10-21: 4 mg via INTRAVENOUS

## 2017-10-21 MED ORDER — MUPIROCIN 2 % EX OINT
1.0000 "application " | TOPICAL_OINTMENT | Freq: Once | CUTANEOUS | Status: AC
  Administered 2017-10-21: 1 via TOPICAL

## 2017-10-21 MED ORDER — BUPIVACAINE-EPINEPHRINE 0.25% -1:200000 IJ SOLN
INTRAMUSCULAR | Status: DC | PRN
  Administered 2017-10-21: 10 mL

## 2017-10-21 MED ORDER — EPHEDRINE 5 MG/ML INJ
INTRAVENOUS | Status: AC
  Filled 2017-10-21: qty 10

## 2017-10-21 MED ORDER — PROPOFOL 10 MG/ML IV BOLUS
INTRAVENOUS | Status: AC
  Filled 2017-10-21: qty 20

## 2017-10-21 MED ORDER — LACTATED RINGERS IV SOLN
INTRAVENOUS | Status: DC | PRN
  Administered 2017-10-21 (×2): via INTRAVENOUS

## 2017-10-21 MED ORDER — DEXAMETHASONE SODIUM PHOSPHATE 4 MG/ML IJ SOLN
INTRAMUSCULAR | Status: DC | PRN
  Administered 2017-10-21: 8 mg via INTRAVENOUS

## 2017-10-21 MED ORDER — MIDAZOLAM HCL 5 MG/5ML IJ SOLN
INTRAMUSCULAR | Status: DC | PRN
  Administered 2017-10-21: 2 mg via INTRAVENOUS

## 2017-10-21 MED ORDER — MIDAZOLAM HCL 2 MG/2ML IJ SOLN
INTRAMUSCULAR | Status: AC
  Filled 2017-10-21: qty 2

## 2017-10-21 MED ORDER — ROCURONIUM BROMIDE 10 MG/ML (PF) SYRINGE
PREFILLED_SYRINGE | INTRAVENOUS | Status: AC
  Filled 2017-10-21: qty 5

## 2017-10-21 MED ORDER — LACTATED RINGERS IV SOLN
INTRAVENOUS | Status: DC
  Administered 2017-10-21: 09:00:00 via INTRAVENOUS

## 2017-10-21 MED ORDER — DEXAMETHASONE SODIUM PHOSPHATE 10 MG/ML IJ SOLN
INTRAMUSCULAR | Status: AC
  Filled 2017-10-21: qty 1

## 2017-10-21 MED ORDER — PHENYLEPHRINE HCL 10 MG/ML IJ SOLN
INTRAMUSCULAR | Status: DC | PRN
  Administered 2017-10-21: 50 ug/min via INTRAVENOUS

## 2017-10-21 MED ORDER — OXYBUTYNIN CHLORIDE 5 MG PO TABS
5.0000 mg | ORAL_TABLET | Freq: Three times a day (TID) | ORAL | Status: DC
  Administered 2017-10-21 – 2017-10-28 (×22): 5 mg via ORAL
  Filled 2017-10-21 (×22): qty 1

## 2017-10-21 MED ORDER — ROCURONIUM BROMIDE 100 MG/10ML IV SOLN
INTRAVENOUS | Status: DC | PRN
  Administered 2017-10-21: 20 mg via INTRAVENOUS
  Administered 2017-10-21: 40 mg via INTRAVENOUS

## 2017-10-21 MED ORDER — PHENYLEPHRINE 40 MCG/ML (10ML) SYRINGE FOR IV PUSH (FOR BLOOD PRESSURE SUPPORT)
PREFILLED_SYRINGE | INTRAVENOUS | Status: DC | PRN
  Administered 2017-10-21: 40 ug via INTRAVENOUS
  Administered 2017-10-21 (×2): 80 ug via INTRAVENOUS

## 2017-10-21 MED ORDER — FENTANYL CITRATE (PF) 250 MCG/5ML IJ SOLN
INTRAMUSCULAR | Status: AC
  Filled 2017-10-21: qty 5

## 2017-10-21 MED ORDER — MUPIROCIN 2 % EX OINT
TOPICAL_OINTMENT | CUTANEOUS | Status: AC
  Administered 2017-10-21: 1 via TOPICAL
  Filled 2017-10-21: qty 22

## 2017-10-21 SURGICAL SUPPLY — 44 items
APL SKNCLS STERI-STRIP NONHPOA (GAUZE/BANDAGES/DRESSINGS) ×1
BENZOIN TINCTURE PRP APPL 2/3 (GAUZE/BANDAGES/DRESSINGS) ×3 IMPLANT
BLADE CLIPPER SURG (BLADE) IMPLANT
BLADE SURG 15 STRL LF DISP TIS (BLADE) ×1 IMPLANT
BLADE SURG 15 STRL SS (BLADE) ×3
CANISTER SUCT 3000ML PPV (MISCELLANEOUS) IMPLANT
CHLORAPREP W/TINT 26ML (MISCELLANEOUS) ×3 IMPLANT
CLOSURE WOUND 1/2 X4 (GAUZE/BANDAGES/DRESSINGS) ×1
COVER SURGICAL LIGHT HANDLE (MISCELLANEOUS) ×3 IMPLANT
DRAPE LAPAROTOMY 100X72 PEDS (DRAPES) ×3 IMPLANT
DRAPE UTILITY XL STRL (DRAPES) ×3 IMPLANT
DRSG TEGADERM 4X4.75 (GAUZE/BANDAGES/DRESSINGS) ×3 IMPLANT
ELECT CAUTERY BLADE 6.4 (BLADE) ×3 IMPLANT
ELECT REM PT RETURN 9FT ADLT (ELECTROSURGICAL) ×3
ELECTRODE REM PT RTRN 9FT ADLT (ELECTROSURGICAL) ×1 IMPLANT
GAUZE SPONGE 2X2 8PLY STRL LF (GAUZE/BANDAGES/DRESSINGS) ×1 IMPLANT
GAUZE SPONGE 4X4 16PLY XRAY LF (GAUZE/BANDAGES/DRESSINGS) ×3 IMPLANT
GLOVE BIO SURGEON STRL SZ7 (GLOVE) ×3 IMPLANT
GLOVE BIOGEL PI IND STRL 7.5 (GLOVE) ×1 IMPLANT
GLOVE BIOGEL PI INDICATOR 7.5 (GLOVE) ×2
GOWN STRL REUS W/ TWL LRG LVL3 (GOWN DISPOSABLE) ×2 IMPLANT
GOWN STRL REUS W/TWL LRG LVL3 (GOWN DISPOSABLE) ×6
KIT BASIN OR (CUSTOM PROCEDURE TRAY) ×3 IMPLANT
KIT ROOM TURNOVER OR (KITS) ×3 IMPLANT
NDL HYPO 25GX1X1/2 BEV (NEEDLE) ×1 IMPLANT
NEEDLE HYPO 25GX1X1/2 BEV (NEEDLE) ×3 IMPLANT
NS IRRIG 1000ML POUR BTL (IV SOLUTION) ×3 IMPLANT
PACK SURGICAL SETUP 50X90 (CUSTOM PROCEDURE TRAY) ×3 IMPLANT
PAD ARMBOARD 7.5X6 YLW CONV (MISCELLANEOUS) ×3 IMPLANT
PENCIL BUTTON HOLSTER BLD 10FT (ELECTRODE) ×3 IMPLANT
SPONGE GAUZE 2X2 STER 10/PKG (GAUZE/BANDAGES/DRESSINGS) ×2
STRIP CLOSURE SKIN 1/2X4 (GAUZE/BANDAGES/DRESSINGS) ×2 IMPLANT
SUT MNCRL AB 4-0 PS2 18 (SUTURE) ×3 IMPLANT
SUT NOVA NAB DX-16 0-1 5-0 T12 (SUTURE) ×3 IMPLANT
SUT NOVA NAB GS-21 0 18 T12 DT (SUTURE) ×3 IMPLANT
SUT VIC AB 3-0 SH 27 (SUTURE) ×3
SUT VIC AB 3-0 SH 27X BRD (SUTURE) ×1 IMPLANT
SYR BULB 3OZ (MISCELLANEOUS) ×3 IMPLANT
SYR CONTROL 10ML LL (SYRINGE) ×3 IMPLANT
TOWEL OR 17X24 6PK STRL BLUE (TOWEL DISPOSABLE) ×3 IMPLANT
TOWEL OR 17X26 10 PK STRL BLUE (TOWEL DISPOSABLE) ×3 IMPLANT
TUBE CONNECTING 12'X1/4 (SUCTIONS)
TUBE CONNECTING 12X1/4 (SUCTIONS) IMPLANT
YANKAUER SUCT BULB TIP NO VENT (SUCTIONS) IMPLANT

## 2017-10-21 NOTE — Anesthesia Preprocedure Evaluation (Addendum)
Anesthesia Evaluation  Patient identified by MRN, date of birth, ID band Patient awake    Reviewed: Allergy & Precautions, NPO status , Patient's Chart, lab work & pertinent test results, reviewed documented beta blocker date and time   Airway Mallampati: III       Dental  (+) Poor Dentition, Loose,    Pulmonary former smoker,    Pulmonary exam normal breath sounds clear to auscultation       Cardiovascular hypertension, Pt. on medications and Pt. on home beta blockers + CAD, + Past MI and + Peripheral Vascular Disease  Normal cardiovascular exam Rhythm:Regular Rate:Normal  EKG NSR septal infarct Echo LVEF 60-65%   Neuro/Psych PSYCHIATRIC DISORDERS  Neuromuscular disease    GI/Hepatic Neg liver ROS, GERD  Medicated and Controlled,  Endo/Other  diabetes, Well Controlled, Type 2  Renal/GU negative Renal ROS     Musculoskeletal   Abdominal   Peds  Hematology Plavix- last dose 11/7   Anesthesia Other Findings   Reproductive/Obstetrics                            Anesthesia Physical Anesthesia Plan  ASA: III  Anesthesia Plan: General   Post-op Pain Management:    Induction: Intravenous  PONV Risk Score and Plan: 4 or greater and Ondansetron, Metaclopromide, Propofol infusion, Dexamethasone and Treatment may vary due to age or medical condition  Airway Management Planned: Oral ETT  Additional Equipment:   Intra-op Plan:   Post-operative Plan: Extubation in OR  Informed Consent: I have reviewed the patients History and Physical, chart, labs and discussed the procedure including the risks, benefits and alternatives for the proposed anesthesia with the patient or authorized representative who has indicated his/her understanding and acceptance.   Dental advisory given  Plan Discussed with: CRNA, Anesthesiologist and Surgeon  Anesthesia Plan Comments:         Anesthesia Quick  Evaluation

## 2017-10-21 NOTE — Op Note (Signed)
Indications:  The patient presented with a history of an incarcerated umbilical hernia causing a small bowel obstruction.  The hernia was finally reduced.  He is on Plavix, so this has been held for several days.  The patient was examined and we recommended umbilical hernia repair.  Pre-operative diagnosis:  Umbilical hernia with incarcerated omentum  Post-operative diagnosis:  Same  Procedure:  Umbilical hernia repair   Procedure Details  The patient was seen again in the Holding Room. The risks, benefits, complications, treatment options, and expected outcomes were discussed with the patient. The possibilities of reaction to medication, pulmonary aspiration, perforation of viscus, bleeding, recurrent infection, the need for additional procedures, and development of a complication requiring transfusion or further operation were discussed with the patient and/or family. There was concurrence with the proposed plan, and informed consent was obtained. The site of surgery was properly noted/marked. The patient was taken to the Operating Room, identified as Nicholas [name], and the procedure verified as umbilical hernia repair. A Time Out was held and the above information confirmed.  After an adequate level of general anesthesia was obtained, the patient's abdomen was prepped with Chloraprep and draped in sterile fashion.  We made a transverse incision above the umbilicus.  Dissection was carried down to the hernia sac with cautery.  We entered the hernia sac and encountered some incarcerated omentum.  This was reduced back into the preperitoneal space.  The fascial defect measured 8 mm.  We cleared the fascia in all directions. The fascial defect was closed with multiple interrupted figure-of-eight 0 Novofil sutures.  The base of the umbilicus was tacked down with 3-0 Vicryl.  3-0 Vicryl was used to close the subcutaneous tissues and 4-0 Monocryl was used to close the skin.  Steri-strips and clean  dressing were applied.  The patient was extubated and brought to the recovery room in stable condition.  All sponge, instrument, and needle counts were correct prior to closure and at the conclusion of the case.   Estimated Blood Loss: Minimal          Complications: None; patient tolerated the procedure well.         Disposition: PACU - hemodynamically stable.         Condition: stable  Nicholas ArmsMatthew K. Corliss Skainssuei, MD, [facility]  General/ Trauma Surgery  10/21/2017 10:31 AM

## 2017-10-21 NOTE — Progress Notes (Signed)
   Subjective: Mr. Rush FarmerMayfield was seen laying in his bed with his daughter at bedside.  The patient had just returned from procedure and was drowsy.  The patient denied any abdominal pain at the moment.  Objective:  Vital signs in last 24 hours: Vitals:   10/20/17 2045 10/21/17 0552 10/21/17 0805 10/21/17 1045  BP: (!) 131/52 (!) 146/45 (!) 155/53   Pulse: 63 83 74   Resp: 17 16    Temp: 98.5 F (36.9 C) 98.2 F (36.8 C)  98.1 F (36.7 C)  TempSrc: Oral Oral    SpO2: 97% 95% 95%   Weight:      Height:       Physical Exam  Constitutional: He is oriented to person, place, and time. He appears well-developed and well-nourished. No distress.  HENT:  Head: Normocephalic and atraumatic.  Eyes: Conjunctivae are normal.  Cardiovascular: Normal rate, regular rhythm and normal heart sounds.  Pulmonary/Chest: Effort normal and breath sounds normal. No stridor. No respiratory distress.  The patient's breaths are shallow  Abdominal: Soft. Bowel sounds are normal. He exhibits no distension. There is no tenderness.  Neurological: He is alert and oriented to person, place, and time.  Skin: He is not diaphoretic.  Psychiatric: He has a normal mood and affect. His behavior is normal. Judgment and thought content normal.    Assessment/Plan:  Incarcerated umbilical hernia causing small bowel obstruction Patient has had abdominal pain for the past week with constipation and 100 pound weight loss over the past 3 months.  CT scan 10/17/2017 showed high-grade small bowel obstruction caused by a small umbilical hernia containing a portion of the small bowel.    Umbilical hernia repair was done today 10/21/2017.  Surgeons entered the hernia sac found the incarcerated omentum and reduced it back to the peri-peritoneal space.  -Phenergan 12.5 mg IV every 6 hours as needed -Reglan 10 mg as needed -Continue cardiac monitoring -Monitor I/O -Dilaudid 0.25-0.5 mg IV every 5 minutes as needed if pain score  is greater than 4 -Continue meperidine 6.25-12.5 mg -Continue heparin -Incentive spirometry recommended -Pain control: Oxycodone 5 mg every 4 hours as needed, morphine 4 mg/ml 2-4 mg IV every 2 hours as needed  Supraventricular tachycardia The patient has a history of svt's and had some isolated episodes of svt's since admission.   -Resumed home medication of metoprolol 50 mg twice daily, hydralazine 10 mg every 8 hours, diltiazem 360 XT daily, digoxin 0.25 mg daily at home. -Continue cardiac monitoring  Hypertension His blood pressure and since admission has ranged 110-159/47-48 -Continue metoprolol 50 mg twice daily -Continue hydralazine 10 mg every 8 hours -Continue diltiazem 360 mg daily -Continue digoxin 0.25 mg  Diabetes mellitus His blood glucose measurements over the past 24 hours have ranged 80-100 -CBG monitoring  Urinary Retention: -  Patient has pre-existing BPH.  Today the patient had >700 cc of urine in his bladder.  Patient was placed on a coude cath.  There was some blood noted in the urinary output, however this is likely due to trauma when the catheter was placed.  -continued home doxazosin 2mg  qd -Discharge home with Foley catheter per urology recommendation.  The patient was also told to follow-up in 1-2 weeks for Foley catheter removal and trial of void.  Dispo: Anticipated discharge in approximately 2-3 day(s).   Lorenso CourierVahini Fount Bahe, MD Internal Medicine PGY1 Pager:[phone] 10/21/2017, 11:12 AM

## 2017-10-21 NOTE — Anesthesia Postprocedure Evaluation (Signed)
Anesthesia Post Note  Patient: Nicholas [name]  Procedure(s) Performed: UMBILICAL HERNIA REPAIR (N/A Abdomen)     Patient location during evaluation: PACU Anesthesia Type: General Level of consciousness: awake and alert and oriented Pain management: pain level controlled Vital Signs Assessment: post-procedure vital signs reviewed and stable Respiratory status: spontaneous breathing, nonlabored ventilation and respiratory function stable Cardiovascular status: blood pressure returned to baseline and stable Postop Assessment: no apparent nausea or vomiting Anesthetic complications: no    Last Vitals:  Vitals:   10/21/17 1045 10/21/17 1100  BP:  (!) 122/46  Pulse:  (!) 57  Resp:  18  Temp: 36.7 C   SpO2:  100%    Last Pain:  Vitals:   10/21/17 0552  TempSrc: Oral  PainSc:                  Karthika Glasper A.

## 2017-10-21 NOTE — Transfer of Care (Signed)
Immediate Anesthesia Transfer of Care Note  Patient: Nicholas [name]  Procedure(s) Performed: UMBILICAL HERNIA REPAIR (N/A Abdomen)  Patient Location: PACU  Anesthesia Type:General  Level of Consciousness: awake, oriented and patient cooperative  Airway & Oxygen Therapy: Patient Spontanous Breathing and Patient connected to face mask oxygen  Post-op Assessment: Report given to RN and Post -op Vital signs reviewed and stable  Post vital signs: Reviewed and stable  Last Vitals:  Vitals:   10/21/17 0552 10/21/17 0805  BP: (!) 146/45 (!) 155/53  Pulse: 83 74  Resp: 16   Temp: 36.8 C   SpO2: 95% 95%    Last Pain:  Vitals:   10/21/17 0552  TempSrc: Oral  PainSc:          Complications: No apparent anesthesia complications

## 2017-10-21 NOTE — Consult Note (Signed)
10/21/2017 12:00 PM   Nicholas [name] 10/28/1951 [id number]  Referring provider: Dr. Tenny CrawN. Narendra  CC: Urinary retention  HPI: The patient is a 66 year old gentleman who was admitted to the hospital for small bowel obstruction status post herniorrhaphy and reduction of small bowel obstruction who developed urinary retention over the past weekend.  He originally had a catheter placed followed by Foley catheter.  It is unclear if these were ever in the right position.  Currently has no catheter this time and is unable to void.  He has over 700 cc in his bladder.  He is unable to provide much of a history at this time as he is in the postanesthesia care unit recovering from his recent anesthetic.  He is currently on doxazosin though it is unclear if this is for BPH or hypertension.   PMH: Past Medical History:  Diagnosis Date  . At high risk for falls   . BPH (benign prostatic hypertrophy)    HX RETENTION  . Carotid artery occlusion    S/P RIGHT CEA  . Coronary artery disease CARDIOLOGIST--  DR Johney FrameALLRED   S/P  PCI TO LAD 1993  . GERD (gastroesophageal reflux disease)   . History of CVA (cerebrovascular accident)    1993---  NO RESIDUALS  . History of head injury    CONCUSSION--  NO RESIDUAL  . History of myocardial infarction    1993--  NON-Q WAVE  S/P PCI TO LAD  . Hydrocele, left   . Hyperlipidemia   . Hypertension   . Left carotid artery stenosis    MILD  . Myocardial infarction (HCC)   . PSVT (paroxysmal supraventricular tachycardia) (HCC)   . PVD (peripheral vascular disease) (HCC)    S/P LEFT FEM-POP  . Right leg weakness    USES CANE--  SECONDARY TO PVD  . Type 2 diabetes mellitus (HCC)   . Wears glasses     Surgical History: Past Surgical History:  Procedure Laterality Date  . CARDIAC CATHETERIZATION  01-18-2003   DR Eden EmmsNISHAN   NON-OBSTRUCTIVE CAD/  PREVIOIS ANGIOPLASTY SITE WIDELY PATENT  . CARDIOVASCULAR STRESS TEST  2007   SMALL ANTERO-APICAL SCAR/  NO ISCHEMIA   . CAROTID ENDARTERECTOMY Right 05-31-2003  . CORONARY ANGIOPLASTY  1993   PCI TO LAD  . FEMORAL-POPLITEAL BYPASS GRAFT Left 01-19-2003  . RIGHT HYDROCELECTOMY  05-31-2003  . TRANSTHORACIC ECHOCARDIOGRAM  08-24-2010   EF 55%/  MILD AORTIC INSUFFICENCY/  MODERATE LVH     Allergies: No Known Allergies  Family History: Family History  Problem Relation Age of Onset  . Stroke Mother   . Hypertension Mother   . Hypertension Father     Social History:  reports that he quit smoking about 24 years ago. His smoking use included cigarettes. He quit after 0.00 years of use. he has never used smokeless tobacco. He reports that he does not drink alcohol or use drugs.  ROS: 12 point negative except for above  Physical Exam: BP (!) 124/50 (BP Location: Left Arm)   Pulse 72   Temp 98.1 F (36.7 C)   Resp 14   Ht 5\' 11"  (1.803 m)   Wt 188 lb 7.9 oz (85.5 kg)   SpO2 100%   BMI 26.29 kg/m   Constitutional:  Alert and oriented, No acute distress. HEENT: [place] AT, moist mucus membranes.  Trachea midline, no masses. Cardiovascular: No clubbing, cyanosis, or edema. Respiratory: Normal respiratory effort, no increased work of breathing. GI: Abdomen is soft, nontender, nondistended,  no abdominal masses GU: No CVA tenderness.  Skin: No rashes, bruises or suspicious lesions. Lymph: No cervical or inguinal adenopathy. Neurologic: Grossly intact, no focal deficits, moving all 4 extremities. Psychiatric: Normal mood and affect.  Laboratory Data: Lab Results  Component Value Date   WBC 11.5 (H) 10/17/2017   HGB 13.5 10/17/2017   HCT 38.7 (L) 10/17/2017   MCV 99.2 10/17/2017   PLT 375 10/17/2017    Lab Results  Component Value Date   CREATININE 0.91 10/21/2017    No results found for: PSA  No results found for: TESTOSTERONE  Lab Results  Component Value Date   HGBA1C 5.4 03/12/2017    Urinalysis    Component Value Date/Time   COLORURINE YELLOW 10/17/2017 1512   APPEARANCEUR  CLEAR 10/17/2017 1512   LABSPEC >1.046 (H) 10/17/2017 1512   PHURINE 7.0 10/17/2017 1512   GLUCOSEU NEGATIVE 10/17/2017 1512   HGBUR NEGATIVE 10/17/2017 1512   BILIRUBINUR NEGATIVE 10/17/2017 1512   KETONESUR 5 (A) 10/17/2017 1512   PROTEINUR 30 (A) 10/17/2017 1512   UROBILINOGEN 0.2 10/09/2015 1232   NITRITE NEGATIVE 10/17/2017 1512   LEUKOCYTESUR NEGATIVE 10/17/2017 1512   Procedure: An 2318 French coud catheter was easily placed per urethra after prepping the patient in the usual sterile fashion.  There is immediate return of over 700 cc of clear yellow urine.  10 cc was placed in the balloon.  Assessment & Plan:    1.  Urinary retention 2.  BPH The patient's catheter is now in place.  I would continue his doxazosin at this time as it will help with BPH.  He should be discharged home with his Foley catheter.  He will follow-up with us as an outpatient for Foley catheter removal and trial of void.  This will take place in approximately 1-2 weeks.  No further urological intervention indicated at this time.   Hildred LaserBrian James Purva Vessell, MD

## 2017-10-21 NOTE — Progress Notes (Signed)
Report was called to short stay. Pre procedure checklist completed and patient transported for surgery.

## 2017-10-21 NOTE — Anesthesia Procedure Notes (Addendum)
Procedure Name: Intubation Date/Time: 10/21/2017 9:45 AM Performed by: Julian ReilWelty, Chong Wojdyla F, CRNA Pre-anesthesia Checklist: Patient identified, Emergency Drugs available, Suction available, Patient being monitored and Timeout performed Patient Re-evaluated:Patient Re-evaluated prior to induction Oxygen Delivery Method: Circle system utilized Preoxygenation: Pre-oxygenation with 100% oxygen Induction Type: IV induction Ventilation: Mask ventilation without difficulty Laryngoscope Size: Miller and 3 Grade View: Grade I Tube type: Oral Tube size: 7.5 mm Number of attempts: 1 Placement Confirmation: ETT inserted through vocal cords under direct vision,  positive ETCO2 and breath sounds checked- equal and bilateral Secured at: 23 cm Tube secured with: Tape Dental Injury: Teeth and Oropharynx as per pre-operative assessment  Comments: 4x4s bite block used.  Patient reports loose lower front tooth preop, able to wiggle with finger.

## 2017-10-21 NOTE — Progress Notes (Signed)
Pt for umbilical hernia repair, NPO midnight, PCR done  MRSA negative, Staph positive, informed his daughter and she will sign his consent.

## 2017-10-21 NOTE — Progress Notes (Signed)
   Subjective/Chief Complaint: Patient complaining mostly of urinary issues - Coude removed - some gross hematuria Condom cath in place No abdominal pain around hernia   Objective: Vital signs in last 24 hours: Temp:  [98.2 F (36.8 C)-98.5 F (36.9 C)] 98.2 F (36.8 C) (11/12 0552) Pulse Rate:  [63-83] 83 (11/12 0552) Resp:  [16-17] 16 (11/12 0552) BP: (110-159)/(45-52) 146/45 (11/12 0552) SpO2:  [95 %-97 %] 95 % (11/12 0552) Last BM Date: 10/19/17  Intake/Output from previous day: 11/11 0701 - 11/12 0700 In: 2123 [P.O.:120; I.V.:2003] Out: 2001 [Urine:2001] Intake/Output this shift: No intake/output data recorded.  General appearance: alert, cooperative and no distress GI: soft, non-tender; umbilical hernia visible, but reducible; non-tender at this time  Lab Results:  No results for input(s): WBC, HGB, HCT, PLT in the last 72 hours. BMET Recent Labs    10/20/17 0358 10/21/17 0527  NA 140 138  K 3.8 3.8  CL 107 105  CO2 28 27  GLUCOSE 88 102*  BUN 5* 5*  CREATININE 1.00 0.91  CALCIUM 8.5* 8.5*   PT/INR No results for input(s): LABPROT, INR in the last 72 hours. ABG No results for input(s): PHART, HCO3 in the last 72 hours.  Invalid input(s): PCO2, PO2  Studies/Results: No results found.  Anti-infectives: Anti-infectives (From admission, onward)   Start     Dose/Rate Route Frequency Ordered Stop   10/21/17 0800  ceFAZolin (ANCEF) IVPB 2g/100 mL premix     2 g 200 mL/hr over 30 Minutes Intravenous To Short Stay 10/20/17 2314 10/22/17 0800   10/17/17 1630  piperacillin-tazobactam (ZOSYN) IVPB 3.375 g     3.375 g 100 mL/hr over 30 Minutes Intravenous  Once 10/17/17 1621 10/17/17 1716      Assessment/Plan: Will plan umbilical hernia repair with mesh this morning.  The surgical procedure has been discussed with the patient.  Potential risks, benefits, alternative treatments, and expected outcomes have been explained.  All of the patient's questions at  this time have been answered.  The likelihood of reaching the patient's treatment goal is good.  The patient understand the proposed surgical procedure and wishes to proceed.   LOS: 3 days    Aydon Swamy K. 10/21/2017

## 2017-10-21 NOTE — Progress Notes (Signed)
Patient returned to unit from surgery. Patient currently resting. Will continue to monitor.

## 2017-10-22 ENCOUNTER — Encounter (HOSPITAL_COMMUNITY): Payer: Self-pay | Admitting: Surgery

## 2017-10-22 ENCOUNTER — Other Ambulatory Visit: Payer: Self-pay

## 2017-10-22 DIAGNOSIS — R338 Other retention of urine: Secondary | ICD-10-CM

## 2017-10-22 DIAGNOSIS — N401 Enlarged prostate with lower urinary tract symptoms: Secondary | ICD-10-CM

## 2017-10-22 LAB — GLUCOSE, CAPILLARY: Glucose-Capillary: 112 mg/dL — ABNORMAL HIGH (ref 65–99)

## 2017-10-22 NOTE — Care Management Important Message (Signed)
Important Message  Patient Details  Name: Nicholas FullingWillie J [name] MRN: [id number] Date of Birth: 01/01/1951   Medicare Important Message Given:  Yes    [name] 10/22/2017, 12:39 PM

## 2017-10-22 NOTE — Evaluation (Signed)
Physical Therapy Evaluation Patient Details Name: Nicholas [name] MRN: [id number] DOB: [birth year] Today's Date: 10/22/2017   History of Present Illness  Pt is a 66 y/o male admitted secondary to SBO from umbilical hernia. Pt is s/p hernia repair on 11/12. Pt also experiencing urinary retention, therefore, per urology notes will be d/c'd with foley catheter. PMH includes CAD, CVA, MI, HTN, PVD, DM, BPH, and s/p coronary angioplasty.   Clinical Impression  Pt is s/p surgery above with deficits below. PTA, pt was independent with use of rollator and cane, however, reports multiple falls at home. Upon eval, pt presenting with post op pain, weakness, pain from catheter, and decreased steadiness. Pt presenting with shuffle gait initially when beginning ambulation and when turning, presenting similar to freezing period in parkinson type gait. Required min to min guard for steadying. Pt very concerned about going home alone as he has no one to check on him. Pt also concerned about managing new foley catheter at home. Pt is a high fall risk given instability and new line management. Recommending short term SNF prior to return home to increase independence and safety with functional mobility. Will continue to follow acutely to maximize functional mobility independence and safety.     Follow Up Recommendations SNF;Supervision/Assistance - 24 hour    Equipment Recommendations  None recommended by PT    Recommendations for Other Services       Precautions / Restrictions Precautions Precautions: Fall Precaution Comments: Has had multiple falls at home.  Restrictions Weight Bearing Restrictions: No      Mobility  Bed Mobility               General bed mobility comments: In chair upon entry   Transfers Overall transfer level: Needs assistance Equipment used: Rolling walker (2 wheeled) Transfers: Sit to/from Stand Sit to Stand: Min assist         General transfer comment: Min A for  steadying assist. Required increased time to perform.   Ambulation/Gait Ambulation/Gait assistance: Min assist;Min guard Ambulation Distance (Feet): 100 Feet Assistive device: Rolling walker (2 wheeled) Gait Pattern/deviations: Step-through pattern;Decreased stride length;Shuffle;Festinating;Trunk flexed Gait velocity: Decreased  Gait velocity interpretation: Below normal speed for age/gender General Gait Details: Pt unsteady during gait, especially during turns. Pt with difficulty starting gait and presented with shuffle pattern until able to settle during gait to a step through pattern. Pt also demonstrates shuffling during turns, similar to freezing periods in parkinson type gait. Pt reports he has difficulty starting gait normally. When progressed to step through, pt continues to exhibit shortened stride length. Min to min guard assist throughout for steadying.   Stairs            Wheelchair Mobility    Modified Rankin (Stroke Patients Only)       Balance Overall balance assessment: Needs assistance Sitting-balance support: No upper extremity supported;Feet supported Sitting balance-Leahy Scale: Fair     Standing balance support: Bilateral upper extremity supported;During functional activity Standing balance-Leahy Scale: Poor Standing balance comment: Reliant on BUE support.                              Pertinent Vitals/Pain Pain Assessment: 0-10 Pain Score: 7  Pain Location: abdomen  Pain Descriptors / Indicators: Aching;Operative site guarding Pain Intervention(s): Limited activity within patient's tolerance;Monitored during session;Repositioned    Home Living Family/patient expects to be discharged to:: Private residence Living Arrangements: Alone Available Help at Discharge: Other (  Comment)(reports no one can check on him) Type of Home: Apartment Home Access: Level entry     Home Layout: One level Home Equipment: Tub bench;Walker - 4 wheels;Cane  - single point;Wheelchair - manual;Bedside commode Additional Comments: Reports he has a daughter, however, reports she is very busy and cannot check in on him.     Prior Function Level of Independence: Independent with assistive device(s)         Comments: Uses cane for ambulation into bathroom. Rollator outside of home      Hand Dominance   Dominant Hand: Right    Extremity/Trunk Assessment   Upper Extremity Assessment Upper Extremity Assessment: Generalized weakness    Lower Extremity Assessment Lower Extremity Assessment: LLE deficits/detail;Generalized weakness LLE Deficits / Details: L foot swelling noted, which pt reports is baseline. Unable to perform ankle pump on L secondary to swelling.     Cervical / Trunk Assessment Cervical / Trunk Assessment: Kyphotic  Communication   Communication: No difficulties  Cognition Arousal/Alertness: Awake/alert Behavior During Therapy: WFL for tasks assessed/performed Overall Cognitive Status: No family/caregiver present to determine baseline cognitive functioning                                        General Comments General comments (skin integrity, edema, etc.): Pt concerned about going home given current deficits and new lines to manage. Pt reports history of falls as well. Reports he does not feel safe to go home by himself. Discussed SNF with pt to increase independence and safety; seemed agreeable.     Exercises     Assessment/Plan    PT Assessment Patient needs continued PT services  PT Problem List Decreased strength;Decreased balance;Decreased mobility;Decreased knowledge of use of DME;Decreased knowledge of precautions;Pain       PT Treatment Interventions DME instruction;Gait training;Therapeutic activities;Functional mobility training;Balance training;Therapeutic exercise;Neuromuscular re-education;Patient/family education    PT Goals (Current goals can be found in the Care Plan section)   Acute Rehab PT Goals Patient Stated Goal: to get this catheter out  PT Goal Formulation: With patient Time For Goal Achievement: 11/05/17 Potential to Achieve Goals: Good    Frequency Min 2X/week   Barriers to discharge Decreased caregiver support No one available to assist upon d/c     Co-evaluation               AM-PAC PT "6 Clicks" Daily Activity  Outcome Measure Difficulty turning over in bed (including adjusting bedclothes, sheets and blankets)?: A Little Difficulty moving from lying on back to sitting on the side of the bed? : Unable Difficulty sitting down on and standing up from a chair with arms (e.g., wheelchair, bedside commode, etc,.)?: Unable Help needed moving to and from a bed to chair (including a wheelchair)?: A Little Help needed walking in hospital room?: A Little Help needed climbing 3-5 steps with a railing? : A Lot 6 Click Score: 13    End of Session Equipment Utilized During Treatment: Gait belt Activity Tolerance: Patient tolerated treatment well Patient left: in chair;with call bell/phone within reach Nurse Communication: Mobility status PT Visit Diagnosis: Unsteadiness on feet (R26.81);Muscle weakness (generalized) (M62.81);History of falling (Z91.81);Repeated falls (R29.6);Other abnormalities of gait and mobility (R26.89)    Time: 0981-19141102-1125 PT Time Calculation (min) (ACUTE ONLY): 23 min   Charges:   PT Evaluation $PT Eval Low Complexity: 1 Low PT Treatments $Gait Training: 8-22 mins  PT G Codes:        Gladys DammeBrittany Asahd Can, PT, DPT  Acute Rehabilitation Services  Pager: [phone]   Lehman PromBrittany S Dakiyah Heinke 10/22/2017, 11:38 AM

## 2017-10-22 NOTE — Progress Notes (Signed)
1 Day Post-Op    CC:  Abdominal pain with incarcerated umbilical hernia/SBO  Subjective: Not happy about much this AM.  Very annoyed by the foley in place.  Placed himself on liquids this AM, not sure why.  Wants to shower.     Objective: Vital signs in last 24 hours: Temp:  [97.8 F (36.6 C)-98.2 F (36.8 C)] 98.2 F (36.8 C) (11/13 0552) Pulse Rate:  [57-74] 70 (11/13 0552) Resp:  [14-24] 17 (11/13 0552) BP: (122-154)/(46-81) 152/57 (11/13 0552) SpO2:  [88 %-100 %] 94 % (11/13 0552) Last BM Date: 10/19/17 480 PO recorded 4000 IV 3200 urine Afebrile, VSS No labs/xrays   Intake/Output from previous day: 11/12 0701 - 11/13 0700 In: 4425 [P.O.:480; I.V.:3945] Out: 3200 [Urine:3200] Intake/Output this shift: No intake/output data recorded.     General appearance: alert, cooperative and no distress Resp: clear to auscultation bilaterally GI: soft, sore, site looks fine.    Lab Results:  No results for input(s): WBC, HGB, HCT, PLT in the last 72 hours.  BMET Recent Labs    10/20/17 0358 10/21/17 0527  NA 140 138  K 3.8 3.8  CL 107 105  CO2 28 27  GLUCOSE 88 102*  BUN 5* 5*  CREATININE 1.00 0.91  CALCIUM 8.5* 8.5*   PT/INR No results for input(s): LABPROT, INR in the last 72 hours.  Recent Labs  Lab 10/17/17 1443 10/17/17 2335 10/20/17 0358  AST 32 31 24  ALT 33 26 20  ALKPHOS 99 82 72  BILITOT 0.9 1.0 1.0  PROT 8.0 6.2* 5.1*  ALBUMIN 4.6 3.7 2.9*     Lipase     Component Value Date/Time   LIPASE 21 10/17/2017 1443     Medications: . digoxin  0.25 mg Oral Daily  . diltiazem  360 mg Oral Daily  . doxazosin  2 mg Oral Daily  . feeding supplement (ENSURE ENLIVE)  237 mL Oral BID BM  . heparin  5,000 Units Subcutaneous Q8H  . hydrALAZINE  10 mg Oral Q8H  . metoprolol tartrate  50 mg Oral BID  . mupirocin ointment   Nasal BID  . oxybutynin  5 mg Oral TID  . sodium chloride flush  3 mL Intravenous Q12H   . dextrose 5 % and 0.9 % NaCl with  KCl 20 mEq/L 100 mL/hr at 10/22/17 0753  . lactated ringers    . lactated ringers 10 mL/hr at 10/21/17 0848   Anti-infectives (From admission, onward)   Start     Dose/Rate Route Frequency Ordered Stop   10/21/17 0800  ceFAZolin (ANCEF) IVPB 2g/100 mL premix     2 g 200 mL/hr over 30 Minutes Intravenous To Short Stay 10/20/17 2314 10/21/17 0957   10/17/17 1630  piperacillin-tazobactam (ZOSYN) IVPB 3.375 g     3.375 g 100 mL/hr over 30 Minutes Intravenous  Once 10/17/17 1621 10/17/17 1716      Assessment/Plan Umbilical hernia with incarcerated omentum Umbilical hernia repair, 10/21/17 Dr. Manus RuddMatthew Tsuei  POD 1 Chronic illness with 100 lb weight loss since April 2018 Recent In Pt rehab for lumbar radiculopathy SVT this admission - on home meds metoprolol, diltiazem and digoxin. Consider cardiology consult if SVT persists. Currently NSR Urinary retention - foley in place CAD/Hx of MI/history of PSVT  - holding Plavix last dose 10/16/17 Aortic and mitral regurgitation./ EF 60-65% 03/2017 Echo Carotid stenosis and occlusion with hx of CVA PVOD - s/p Left Fem-pop BPG Type II diabetes Hypertension Probable malnutrition and  deconditioning Hx of tobacco use   FEN: IVF, Regular diet ID:  1 dose of Zosyn 11/8 then DC'ed.  Ancef 10/21/17 x 1 dose DVT:  Heparin Foley: placed for urinary retention Follow up:  TBD  Plan:  He wants foley out but will defer to Urology on that.  I will let him shower and the more he walks the better.  No lifting over 10 pounds for 6 weeks.  He does not work.Marland [employer].  He can go home when medically stable.  I will put follow up for our service and discharge instruction in AVS.         LOS: 4 days    Phyliss Hulick 10/22/2017 [phone]

## 2017-10-22 NOTE — Progress Notes (Signed)
   Subjective: Nicholas [name] was seen laying in his bed this morning.  He states that he has been able to eat some soup and salad since yesterday after the procedure.  He denies any nausea, vomiting.  He states that he still has some abdominal pain but it is not the same intensity as previous.  He has been able to pass gas.  Objective:  Vital signs in last 24 hours: Vitals:   10/21/17 1408 10/21/17 2015 10/22/17 0552 10/22/17 1441  BP: (!) 142/58 (!) 125/52 (!) 152/57 (!) 109/36  Pulse: 70 74 70 (!) 59  Resp:  18 17 16   Temp:  97.9 F (36.6 C) 98.2 F (36.8 C) 97.6 F (36.4 C)  TempSrc:  Oral Oral Oral  SpO2: 96% 97% 94% 99%  Weight:      Height:       Physical Exam  Constitutional: He appears well-developed and well-nourished. No distress.  HENT:  Head: Normocephalic and atraumatic.  Eyes: Conjunctivae are normal.  Cardiovascular: Normal rate, regular rhythm and normal heart sounds.  Pulmonary/Chest: Effort normal and breath sounds normal. No stridor. No respiratory distress.  Abdominal: Soft. Bowel sounds are normal. He exhibits no distension. There is tenderness (Lower abdomen tenderness to palpation and rebound). There is rebound (Lower abdomen).  Neurological: He is alert.  Skin: He is not diaphoretic.  Psychiatric: He has a normal mood and affect. His behavior is normal. Judgment and thought content normal.    Assessment/Plan:  Incarcerated umbilical hernia causing small bowel obstruction Patient has had abdominal pain one week with constipation and 100 pound weight loss over the past 3 months. CT scan 10/17/2017 showed high-grade small bowel obstruction caused by a small umbilical hernia containing a portion of the small bowel. Umbilical hernia repair was done 10/21/2017.  Surgeons entered the hernia sac found the incarcerated omentum and reduced it back to the peri-peritoneal space.  Patient is doing well postoperatively and the patient's lower abdominal pain is likely due to  incision used for the surgery.  -Physical therapy saw patient and recommended SNF.  Social work consult made. -Continue incentive spirometry -Phenergan 12.5 mg IV every 6 hours as needed -Reglan 10 mg as needed -Continue cardiac monitoring -Continue heparin subcutaneous q8hrs  -Pain control: Oxycodone 5 mg every 4 hours as needed, morphine 4 mg/ml 2-4 mg IV every 2 hours as needed  Supraventricular tachycardia The patient has a history of svt's and had some isolated episodes of svt's since admission.   -Resumed home medication ofmetoprolol 50 mg twice daily, hydralazine 10 mg every 8 hours, diltiazem 360 XT daily, digoxin 0.25 mg daily at home. -Continue cardiac monitoring  Hypertension His blood pressure over the past 24 hours has ranged 122-155/46-53 -Continue metoprolol 50 mg twice daily -Continue hydralazine 10 mg every 8 hours -Continue diltiazem 360 mg daily -Continue digoxin 0.25 mg  Diabetes mellitus His blood glucose measurements over the past 24 hours have ranged 80-112 -CBG monitoring  Urinary Retention: Patient has pre-existing BPH.   -Placed on a coude catheter -continue doxazosin 2mg  qd -oxybutynin 5mg  tid -Discharge home with Foley catheter per urology recommendation.  The patient was also told to follow-up in 1-2 weeks for Foley catheter removal and trial of void.  Dispo: Anticipated discharge in approximately 1-2 day(s).  [facility]  Lorenso CourierVahini Charlene Detter, MD Internal Medicine PGY1 [phone] 10/22/2017, 3:03 PM

## 2017-10-23 ENCOUNTER — Institutional Professional Consult (permissible substitution): Payer: Medicare Other | Admitting: Internal Medicine

## 2017-10-23 DIAGNOSIS — M6289 Other specified disorders of muscle: Secondary | ICD-10-CM

## 2017-10-23 LAB — GLUCOSE, CAPILLARY: Glucose-Capillary: 185 mg/dL — ABNORMAL HIGH (ref 65–99)

## 2017-10-23 MED ORDER — DOCUSATE SODIUM 50 MG/5ML PO LIQD
25.0000 mg | Freq: Every day | ORAL | Status: DC | PRN
  Administered 2017-10-23: 25 mg via ORAL
  Filled 2017-10-23: qty 10

## 2017-10-23 NOTE — NC FL2 (Signed)
[place] MEDICAID FL2 LEVEL OF CARE SCREENING TOOL     IDENTIFICATION  Patient Name: Nicholas FullingWillie J Letarte Birthdate: 12/29/1950 Sex: male Admission Date (Current Location): 10/17/2017  [facility] [place]   Facility and Address:  [facility], [address], [place], [place] [address]      Provider Number: [id number]  Attending Physician Name and Address:  Earl LagosNarendra, Nischal, MD  Relative Name and Phone Number:       Current Level of Care: Hospital Recommended Level of Care: Skilled Nursing Facility Prior Approval Number:    Date Approved/Denied:   PASRR Number: [id number]  Discharge Plan: SNF    Current Diagnoses: Patient Active Problem List   Diagnosis Date Noted  . SBO (small bowel obstruction) (HCC) 10/17/2017  . Bacterial UTI 03/19/2017  . Adjustment disorder   . Slow transit constipation   . Weakness of both lower extremities   . Bilateral foot-drop   . Lumbar radiculopathy 03/13/2017  . Lumbar spinal stenosis 03/13/2017  . Foot drop, right   . Urinary retention due to benign prostatic hyperplasia   . Benign essential HTN   . History of CVA (cerebrovascular accident) 03/11/2017  . Cognitive deficits, late effect of cerebrovascular disease 07/23/2014  . 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 Aortic valve disorder 08/09/2010  . CLAUDICATION 08/09/2010  . Hyperlipidemia 08/08/2010  . TOBACCO ABUSE 08/08/2010  . Essential hypertension 08/08/2010  . CAD, NATIVE VESSEL 08/08/2010  . Occlusion and stenosis of carotid artery 08/08/2010    Orientation RESPIRATION BLADDER Height & Weight     Self, Time, Situation, Place  Normal Incontinent, External catheter(catheter placed 10/21/17) Weight: 188 lb 7.9 oz (85.5 kg) Height:  5\' 11"  (180.3 cm)  BEHAVIORAL SYMPTOMS/MOOD NEUROLOGICAL BOWEL NUTRITION STATUS      Continent    AMBULATORY STATUS COMMUNICATION OF NEEDS Skin   Limited Assist Verbally  Surgical wounds(closed abdomen 10/21/17, gauze dressing with steri strips)                       Personal Care Assistance Level of Assistance  Bathing, Feeding, Dressing Bathing Assistance: Limited assistance Feeding assistance: Independent Dressing Assistance: Limited assistance     Functional Limitations Info  Sight, Hearing, Speech Sight Info: Adequate Hearing Info: Adequate Speech Info: Adequate    SPECIAL CARE FACTORS FREQUENCY  PT (By licensed PT), OT (By licensed OT)     PT Frequency: 5x/wk OT Frequency: 5x/wk            Contractures Contractures Info: Not present    Additional Factors Info  Code Status, Allergies Code Status Info: DNR Allergies Info: NKA           Current Medications (10/23/2017):  This is the current hospital active medication list Current Facility-Administered Medications  Medication Dose Route Frequency Provider Last Rate Last Dose  . acetaminophen (TYLENOL) tablet 650 mg  650 mg Oral Q6H PRN Deneise LeverSaraiya, Parth, MD       Or  . acetaminophen (TYLENOL) suppository 650 mg  650 mg Rectal Q6H PRN Deneise LeverSaraiya, Parth, MD      . digoxin Margit Banda(LANOXIN) tablet 0.25 mg  0.25 mg Oral Daily Chundi, Vahini, MD   0.25 mg at 10/23/17 0949  . diltiazem (CARDIZEM CD) 24 hr capsule 360 mg  360 mg Oral Daily Earl LagosNarendra, Nischal, MD   360 mg at 10/23/17 0948  . doxazosin (CARDURA) tablet 2 mg  2 mg Oral Daily Lorenso Courierhundi, Vahini, MD  2 mg at 10/23/17 1001  . feeding supplement (ENSURE ENLIVE) (ENSURE ENLIVE) liquid 237 mL  237 mL Oral BID BM Meuth, Brooke A, PA-C   237 mL at 10/23/17 1002  . heparin injection 5,000 Units  5,000 Units Subcutaneous Q8H Deneise LeverSaraiya, Parth, MD   5,000 Units at 10/23/17 [phone]  . hydrALAZINE (APRESOLINE) tablet 10 mg  10 mg Oral Q8H Chundi, Vahini, MD   10 mg at 10/23/17 0611  . lactated ringers infusion   Intravenous Continuous Mal AmabileFoster, Michael, MD      . metoprolol tartrate (LOPRESSOR) tablet 50 mg  50 mg Oral BID Lorenso Courierhundi, Vahini, MD   50 mg at  10/23/17 0948  . morphine 4 MG/ML injection 2-4 mg  2-4 mg Intravenous Q2H PRN Manus Ruddsuei, Matthew, MD      . mupirocin ointment Idelle Jo(BACTROBAN) 2 %   Nasal BID Kinsinger, De BlanchLuke Aaron, MD      . oxybutynin [facility](DITROPAN) tablet 5 mg  5 mg Oral TID Manus Ruddsuei, Matthew, MD   5 mg at 10/23/17 0948  . oxyCODONE (Oxy IR/ROXICODONE) immediate release tablet 5 mg  5 mg Oral Q4H PRN Manus Ruddsuei, Matthew, MD   5 mg at 10/23/17 [id number]  . promethazine (PHENERGAN) injection 12.5 mg  12.5 mg Intravenous Q6H PRN Arnetha CourserAmin, Sumayya, MD      . sodium chloride flush (NS) 0.9 % injection 3 mL  3 mL Intravenous Q12H Deneise LeverSaraiya, Parth, MD   3 mL at 10/23/17 [id number]     Discharge Medications: Please see discharge summary for a list of discharge medications.  Relevant Imaging Results:  Relevant Lab Results:   Additional Information SS#: [id number]  Baldemar LenisElizabeth M Avary Pitsenbarger, LCSW

## 2017-10-23 NOTE — Progress Notes (Signed)
   Subjective: [name] was seen sitting in his chair by bedside this morning. He states that he still has not had any bowel movements, but is passing gas. He was able to tolerate eating breakfast without any difficulty.   Objective:  Vital signs in last 24 hours: Vitals:   10/22/17 1441 10/22/17 1500 10/22/17 1758 10/23/17 0454  BP: (!) 109/36 (!) 114/54 (!) 132/50 (!) 137/54  Pulse: (!) 59 66 65 (!) 58  Resp: 16   17  Temp: 97.6 F (36.4 C)  (!) 97.3 F (36.3 C) 98.8 F (37.1 C)  TempSrc: Oral  Oral Oral  SpO2: 99%  95% 96%  Weight:      Height:       Physical Exam  Constitutional: He appears well-developed and well-nourished. No distress.  HENT:  Head: Normocephalic and atraumatic.  Eyes: Conjunctivae are normal.  Cardiovascular: Normal rate and regular rhythm.  Murmur heard. Pulmonary/Chest: Effort normal and breath sounds normal. No stridor. No respiratory distress.  Abdominal: Soft. Bowel sounds are normal. He exhibits no distension. There is no tenderness.  Neurological: He is alert. He exhibits abnormal muscle tone (decreased muscle strength on left side ).  Skin: He is not diaphoretic.  Psychiatric: He has a normal mood and affect. His behavior is normal. Judgment and thought content normal.    Assessment/Plan: Incarcerated umbilical herniacausing small bowel obstruction Patient has had abdominal pain one week with constipation and 100 pound weight loss over the past 3 months. CT scan 10/17/2017 showed high-grade small bowel obstruction caused by a small umbilical hernia containing a portion of the small bowel.Umbilical hernia repair was done 10/21/2017.Surgeons entered the hernia sac found the incarcerated omentum and reduced it back to the peri-peritoneal space.  -Surgery saw patient and allowed patient to shower, ambulate, no lifting >10lbs for 6 weeks. Steri-strips can come be removed in 5 days and surgery will follow up as needed.  -Continue incentive  spirometry -Phenergan12.5 mg IV every 6 hours as needed -Reglan 10 mg as needed -Continue cardiac monitoring -Continue heparin subcutaneous q8hrs  -Pain control: Oxycodone 5 mg every 4 hours as needed. The patient has only needed one dose daily for past 2 days -Discontinued morphine 4 mg/ml2-4 mg  -Physical therapy saw patient and recommended SNF  Supraventricular tachycardia The patient has a history of svt's andhad some isolatedepisodes of svt's since admission.   -Resumed home medication ofmetoprolol 50 mg twice daily, hydralazine 10 mg every 8 hours, diltiazem 360 XT daily, digoxin 0.25 mg daily at home. -Continue cardiac monitoring  Hypertension His blood pressure over the past 24 hours has ranged 109-137/36-54 -Continue metoprolol 50 mg twice daily -Continue hydralazine 10 mg every 8 hours -Continue diltiazem 360 mg daily -Continue digoxin 0.25 mg  Diabetes mellitus -Continue CBG monitoring, pending cbg today-requested nurse to get   Urinary Retention: Patient has pre-existing BPH.  -Continue on coude catheter -continue doxazosin 2mg  qd -Continue oxybutynin 5mg  tid -Discharge home with Foley catheter per urology recommendation. The patient was also told to follow-up in 1-2 weeks for Foley catheter removal and trial of void.  Dispo: Anticipated discharge in approximately 0-1 day.  -Social work consult for SNF placement   Nicholas CourierVahini Azizi Bally, MD Internal Medicine PGY1 Pager:[phone] 10/23/2017, 11:35 AM

## 2017-10-23 NOTE — Progress Notes (Signed)
2 Days Post-Op    WU:[id number]:Abdominal pain with incarcerated umbilical hernia/SBO    Subjective: He is doing fine from surgery.  Still sore.  Tolerating diet.  No abdominal distention.  Objective: Vital signs in last 24 hours: Temp:  [97.3 F (36.3 C)-98.8 F (37.1 C)] 98.8 F (37.1 C) (11/14 0454) Pulse Rate:  [58-66] 58 (11/14 0454) Resp:  [16-17] 17 (11/14 0454) BP: (109-137)/(36-54) 137/54 (11/14 0454) SpO2:  [95 %-99 %] 96 % (11/14 0454) Last BM Date: 10/19/17 1850 PO 4650urine recorded Afebrile, VSS  No labs Intake/Output from previous day: 11/13 0701 - 11/14 0700 In: 1833 [P.O.:1830; I.V.:3] Out: 4650 [Urine:4650] Intake/Output this shift: Total I/O In: -  Out: 425 [Urine:425]  General appearance: alert, cooperative and no distress GI: Soft, sore, abdomen is not distended.  Tolerating p.o.'s well.  Incision looks fine.  Lab Results:  No results for input(s): WBC, HGB, HCT, PLT in the last 72 hours.  BMET Recent Labs    10/21/17 0527  NA 138  K 3.8  CL 105  CO2 27  GLUCOSE 102*  BUN 5*  CREATININE 0.91  CALCIUM 8.5*   PT/INR No results for input(s): LABPROT, INR in the last 72 hours.  Recent Labs  Lab 10/17/17 1443 10/17/17 2335 10/20/17 0358  AST 32 31 24  ALT 33 26 20  ALKPHOS 99 82 72  BILITOT 0.9 1.0 1.0  PROT 8.0 6.2* 5.1*  ALBUMIN 4.6 3.7 2.9*     Lipase     Component Value Date/Time   LIPASE 21 10/17/2017 1443     Medications: . digoxin  0.25 mg Oral Daily  . diltiazem  360 mg Oral Daily  . doxazosin  2 mg Oral Daily  . feeding supplement (ENSURE ENLIVE)  237 mL Oral BID BM  . heparin  5,000 Units Subcutaneous Q8H  . hydrALAZINE  10 mg Oral Q8H  . metoprolol tartrate  50 mg Oral BID  . mupirocin ointment   Nasal BID  . oxybutynin  5 mg Oral TID  . sodium chloride flush  3 mL Intravenous Q12H    Assessment/Plan Umbilical hernia with incarcerated omentum Umbilical hernia repair, 10/21/17 Dr. Manus RuddMatthew Tsuei  POD 2 Chronic  illness with 100 lb weight loss since April 2018 Recent In Pt rehab for lumbar radiculopathy SVTthis admission - on home medsmetoprolol, diltiazem and digoxin. Consider cardiology consult if SVT persists. Currently NSR Urinary retention - foley in place CAD/Hx ofMI/history of PSVT - holdingPlavix last dose 10/16/17 Aortic and mitral regurgitation./ EF 60-65% 03/2017 Echo Carotid stenosis and occlusion with hx of CVA PVOD - s/p Left Fem-pop BPG Type II diabetes Hypertension Probable malnutrition and deconditioning Hx of tobacco use  FEN:IVF, Regular diet ID: 1 dose of Zosyn11/8then DC'ed.  Ancef 10/21/17 x 1 dose GNF:[id number]:Heparin Foley:placed for urinary retention Follow up: TBD   Plan: He can shower, ambulate, no lifting over 10 pounds for 6 weeks, Steri-Strips can come off in about 5 days if they do not come off with showers.  Follow-up information is in the AVS along with discharge instructions.  We will see again as needed.       LOS: 5 days    Denaly Gatling 10/23/2017 [phone]

## 2017-10-23 NOTE — Clinical Social Work Note (Signed)
Clinical Social Work Assessment  Patient Details  Name: Nicholas [name] MRN: [id number] Date of Birth: 15-Apr-1951  Date of referral:  10/23/17               Reason for consult:  Facility Placement                Permission sought to share information with:  Facility Sport and exercise psychologist, Family Supports Permission granted to share information::  Yes, Verbal Permission Granted  Name::     Programmer, multimedia::  SNF  Relationship::  Daughter  Contact Information:     Housing/Transportation Living arrangements for the past 2 months:  Apartment Source of Information:  Patient Patient Interpreter Needed:  None Criminal Activity/Legal Involvement Pertinent to Current Situation/Hospitalization:  No - Comment as needed Significant Relationships:  Adult Children Lives with:  Self Do you feel safe going back to the place where you live?  Yes Need for family participation in patient care:  Yes (Comment)(patient has requested that CSW contact his daughter for input)  Care giving concerns:  Patient currently lives at home alone and will benefit from short term rehab at discharge to improve ability to independently care for self before returning home.   Social Worker assessment / plan:  CSW met with patient to discuss care planning and recommendation for SNF. CSW discussed concerns with patient and acknowledged that we would make sure that insurance would cover the costs before sending him. CSW completed referral and faxed out. CSW contacted patient's daughter, per patient request, and left voicemail for her to call back. CSW to follow up with bed offers and facilitate transition to SNF when medically ready.  Employment status:  Retired Forensic scientist:  Medicare PT Recommendations:  [place] / Referral to community resources:  [place]  Patient/Family's Response to care:  Patient agreeable to SNF, if insurance will cover  payment.  Patient/Family's Understanding of and Emotional Response to Diagnosis, Current Treatment, and Prognosis:  Patient was slightly apprehensive during discussion, indicating that he wasn't aware of any facilities and didn't know whether or not his insurance would pay. Patient indicated that if insurance was not going to cover, then he would go stay with his daughter for a while instead. Patient acknowledged that he did think that SNF would be a good idea for now, as long as he could find a good place and the insurance would pay. Patient requested that CSW contact his daughter, because sometimes his memory isn't as good as it used to be after so many medical issues, and she helps him with making decisions about what would be best for him.  Emotional Assessment Appearance:  Appears stated age Attitude/Demeanor/Rapport:  Apprehensive Affect (typically observed):  Apprehensive, Appropriate Orientation:  Oriented to Self, Oriented to Place, Oriented to  Time, Oriented to Situation Alcohol / Substance use:  Not Applicable Psych involvement (Current and /or in the community):  No (Comment)  Discharge Needs  Concerns to be addressed:  Care Coordination Readmission within the last 30 days:  No Current discharge risk:  Lives alone, Physical Impairment, Cognitively Impaired Barriers to Discharge:  Continued Medical Work up   [employer], [place] 10/23/2017, 11:51 AM

## 2017-10-24 LAB — GLUCOSE, CAPILLARY: Glucose-Capillary: 69 mg/dL (ref 65–99)

## 2017-10-24 MED ORDER — SENNOSIDES-DOCUSATE SODIUM 8.6-50 MG PO TABS
2.0000 | ORAL_TABLET | Freq: Two times a day (BID) | ORAL | Status: DC
  Administered 2017-10-24 – 2017-10-28 (×8): 2 via ORAL
  Filled 2017-10-24 (×9): qty 2

## 2017-10-24 MED ORDER — POLYETHYLENE GLYCOL 3350 17 G PO PACK
17.0000 g | PACK | Freq: Two times a day (BID) | ORAL | Status: DC
  Administered 2017-10-24 – 2017-10-27 (×8): 17 g via ORAL
  Filled 2017-10-24 (×9): qty 1

## 2017-10-24 NOTE — Discharge Summary (Signed)
Name: Nicholas [name] MRN: [id number] DOB: 07/20/1951 66 y.o. PCP: [name], MD  Date of Admission: 10/17/2017  2:44 PM Date of Discharge: 10/26/2017 Attending Physician: Earl Lagos, MD  Discharge Diagnosis:  SBO (small bowel obstruction) (HCC) Supraventricular tachycardia Urinary retention  Discharge Medications: Allergies as of 10/26/2017   No Known Allergies     Medication List    STOP taking these medications   amoxicillin 500 MG capsule Commonly known as:  AMOXIL   glipiZIDE 5 MG tablet Commonly known as:  GLUCOTROL   senna-docusate 8.6-50 MG tablet Commonly known as:  Senokot-S     TAKE these medications   clopidogrel 75 MG tablet Commonly known as:  PLAVIX Take 1 tablet (75 mg total) by mouth daily with breakfast.   digoxin 0.25 MG tablet Commonly known as:  LANOXIN Take 1 tablet (0.25 mg total) by mouth daily.   diltiazem 360 MG 24 hr capsule Commonly known as:  TAZTIA XT Take 1 capsule (360 mg total) by mouth daily.   docusate sodium 50 MG capsule Commonly known as:  COLACE Take 50 mg by mouth at bedtime.   doxazosin 2 MG tablet Commonly known as:  CARDURA Take 1 tablet (2 mg total) by mouth daily.   hydrALAZINE 10 MG tablet Commonly known as:  APRESOLINE Take 1 tablet (10 mg total) by mouth every 8 (eight) hours.   metoprolol tartrate 50 MG tablet Commonly known as:  LOPRESSOR Take 1 tablet (50 mg total) by mouth 2 (two) times daily.   mineral oil enema Place 133 mLs (1 enema total) as needed rectally for severe constipation.   polyethylene glycol packet Commonly known as:  MIRALAX / GLYCOLAX Take 17 g daily as needed by mouth. What changed:    when to take this  reasons to take this   rosuvastatin 10 MG tablet Commonly known as:  CRESTOR Take 1 tablet (10 mg total) by mouth daily.       Disposition and follow-up:   Mr.Nicholas [name] was discharged from [facility] in stable condition.  At  the hospital follow up visit please address:  1.  SBO: please make sure the patient is having regular bowel movements miralax and colace was prescribed at discharge  Diabetes: glipizide was discontinued as patient was having normal glucose levels even without medication.   SVT: please readdress digoxin, levels have not been checked outpatient  2.  Labs / imaging needed at time of follow-up: none  3.  Pending labs/ test needing follow-up: none  Follow-up Appointments: Follow-up Information    Hildred Laser, MD. Call.   Specialty:  Urology Why:  Call to follow up in 1-2 weeks to have catheter removed Contact information: [address] [place] [address] [phone]        Surgery, [facility] Follow up on 11/05/2017.   Specialty:  General Surgery Why:  Your appointment is at 11:30 AM.  Be at the office 30 minutes early for check in.  Bring photo ID and insurance information.   Contact information: [address] [place] [address] [phone]        [name], MD. Schedule an appointment as soon as possible for a visit in 1 week(s).   Specialty:  Family Medicine Contact information: [address] 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 CT scan 10/17/2017 showed high-grade small bowel obstruction caused by a small umbilical hernia containing a portion of the small bowel. Umbilical hernia repair was done 10/21/2017. The patient has been stable postoperatively.  He is tolerating oral intake well, is passing gas, but has not had a bowel movement thus far. Abdominal x-ray was done on 10/25/17 which showed improved bowel gas pattern and possible ileus and less  likely residual partial obstruction. The patient was once again seen by surgery on 10/25/17 and stated that there is unlikely to be any new ileus or obstruction. They recommended bowel regimen on discharge with miralax and colace. The patient's constipation is thought to be due to his pain medication use and unlikely to be linked to his surgery. The patient took a few doses of oxycodone and morphine post surgery. The patient was initially scheduled to be discharged to a SNF, but based on their evaluation they felt that he does not qualify. The patient was offered home health, but he refused. Was informed by social worker on 10/28/17 that since per PT notes the patient was able to walk 200-38250ft with minimal guard he is back to his baseline functioning status and medicare will not pay for any rehabilitation. The patient and his daughter were notified that he will be discharged home.   Urinary Retention: Patient has pre-existing BPH.  The patient developed urinary retention during this hospitalization. A foley catheter was initially placed. Subsequently, he was seen by urology and they placed a coude catheter which immediately relieved the patient's retention. He is to remain on the catheter and follow up as outpatient in 1-2 weeks. He was continued on doxazosin 2 mg daily, oxybutynin 5 mg 3 times daily. The patient and his daughter were very concerned about the patient going home with a catheter as they felt that his house is not clean and he will get an infection. The patient wanted a trial of void prior to leaving, but urology stated that they feel he will not be able to void on his own at this time and it will be very difficult to re-insert a catheter once it has been removed.   Supraventricular Tachycardia The patient had some isolated episodes of SVTs on admission. Continued home metoprolol 50 mg twice daily, hydralazine 10 mg every 8 hours, diltiazem 360 XT daily, digoxin 0.25 mg daily at home. Digoxin  level on 10/20/17 was 0.7.  Discharge Vitals:   BP (!) 138/46 (BP Location: Left Arm)   Pulse 60   Temp 98.8 F (37.1 C) (Oral)   Resp 16   Ht 5\' 11"  (1.803 m)   Wt 188 lb 7.9 oz (85.5 kg)   SpO2 98%   BMI 26.29 kg/m   Pertinent Labs, Studies, and Procedures:   EKG: Normal sinus rhythm, frequent pvc in bigeminal rhythm that is new in comparison to previous ekgs on file  CXR (10/17/17): No cardiopulmonary abnormalities  CT abdomen pelvis with contrast: High-grade small bowel obstruction caused by small umbilical hernia containing a portion of the small bowel.  No ascites, pneumoperitoneum or abscess.  Aortic atherosclerosis  Abdominal x-ray (10/25/17): Improved bowel gas pattern since 10/17/2017. Residual upper limits of normal to mildly small bowel in the left abdomen might reflect ileus or less likely residual partial obstruction.  Discharge Instructions: Discharge Instructions    Call  MD for:  persistant nausea and vomiting   Complete by:  As directed    Call MD for:  persistant nausea and vomiting   Complete by:  As directed    Call MD for:  redness, tenderness, or signs of infection (pain, swelling, redness, odor or green/yellow discharge around incision site)   Complete by:  As directed    Call MD for:  severe uncontrolled pain   Complete by:  As directed    Call MD for:  temperature >100.4   Complete by:  As directed    Call MD for:  temperature >100.4   Complete by:  As directed    Diet - low sodium heart healthy   Complete by:  As directed    Diet - low sodium heart healthy   Complete by:  As directed    Diet - low sodium heart healthy   Complete by:  As directed    Increase activity slowly   Complete by:  As directed    Increase activity slowly   Complete by:  As directed    Increase activity slowly   Complete by:  As directed       Signed: Lorenso CourierVahini Maybree Riling, MD Internal Medicine PGY1 Pager:[phone] 10/26/2017, 10:12 AM

## 2017-10-24 NOTE — Care Management Note (Addendum)
Case Management Note  Patient Details  Name: Nicholas [name] MRN: [id number] Date of Birth: 02/09/1951  Subjective/Objective:                    Action/Plan:  Spoke to patient at bedside. Explained per [employer] insurance will not cover SNF due to patient ambulating 360 feet. Patient states he cannot discharge to home with catheter due to his home being unclean and he will " get a germ".  Spoke with daughter [name], who understands insurance will not cover SNF or hospital stay for catheter care. Explained nurse can educate patient and her on catheter care, leg bag etc and HHRN can follow. 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 She also refused home health   Dr Delma Officerhundi stated she had left voice mail for [facility] but no call back. [name] stated the only voicemail she had was from social work. Confirmed [name]'s cell number is [phone] . Paged MD.    Expected Discharge Date:                  Expected Discharge Plan:  Home w Home Health Services  In-House Referral:     Discharge planning Services  CM Consult  Post Acute Care Choice:  Home Health Choice offered to:  Patient, Adult Children  DME Arranged:    DME Agency:     HH Arranged:  Patient Refused HH HH Agency:     Status of Service:  In process, will continue to follow  If discussed at Long Length of Stay Meetings, dates discussed:    Additional Comments:  Kingsley PlanWile, Eilidh Marcano Marie, RN 10/24/2017, 4:25 PM

## 2017-10-24 NOTE — Progress Notes (Signed)
Plan was to discharge [name] today with home health based on physical therapy recommendation. I called the patient's home number as the patient stated she did not receive voicemail on her cell phone.  However, the patient and his daughter were both very persistent about him not being discharged home with his catheter in place. His daughter was very concerned about us not allowing him a trial void. The patient stated that he cannot return to his house with a catheter in place as he is hesitant to care for it by himself and his house is not cleann (has spiders and flies) and feels that he will get an infection.   I spoke to Dr. Anne FuLarry [name] and he mentioned that since the patient has only been on the catheter for 2 days, sufficient time has not passed for removal of the catheter. Dr. [name] feels that the patient will likely fail to void on his own at this point if the catheter is removed and it is risky for the patient to be discharged without catheter.  I called and explained to both the daughter and the patient that the bladder needs sufficient time to rest and he is at high risk for readmission if discharged without catheter placement.  The patient will be discharged tomorrow 10/25/2017 with home health services and instruction about how to care for the catheter.  I do not feel that this patient needs to remain hospitalized as there is no new medical intervention being made and he is at higher risk for hospital-acquired infection.  Nicholas CourierVahini Leanor Voris, MD Internal Medicine PGY1 Pager:[phone] 10/24/2017, 5:38 PM

## 2017-10-24 NOTE — Clinical Social Work Note (Signed)
Patient walked 350 feet min guard in PT this afternoon. He is no longer appropriate for SNF placement. RNCM aware and has notified patient and his daughter. Plan for discharge home.  CSW signing off.   Charlynn CourtSarah Von Inscoe, CSW [phone]

## 2017-10-24 NOTE — Progress Notes (Signed)
   Subjective: Mr. Rush FarmerMayfield was seen sitting in a chair this morning. He was upset that he continues to not have any bowel movements. He states that he has still been able to pass gas. He denies any nausea, vomiting. He stated that he is not able to describe his discomfort, but states that he does feels not at ease.  Objective:  Vital signs in last 24 hours: Vitals:   10/23/17 0454 10/23/17 1459 10/23/17 2053 10/24/17 0539  BP: (!) 137/54 (!) 103/44 (!) 126/35 (!) 136/48  Pulse: (!) 58 (!) 58 67 63  Resp: 17 16 17 17   Temp: 98.8 F (37.1 C) 98.5 F (36.9 C) 98.5 F (36.9 C) 98.1 F (36.7 C)  TempSrc: Oral Oral Oral Oral  SpO2: 96% 100% 98% 96%  Weight:      Height:       Physical Exam  Constitutional: He appears well-developed and well-nourished. No distress.  HENT:  Head: Normocephalic and atraumatic.  Eyes: Conjunctivae are normal.  Cardiovascular: Normal rate and regular rhythm.  Murmur (systolic murmur) heard. Pulmonary/Chest: Effort normal and breath sounds normal. No stridor. No respiratory distress.  Abdominal: Soft. Bowel sounds are normal. He exhibits no distension. There is tenderness (lower abdomen).  Neurological: He is alert.  Skin: He is not diaphoretic.  Psychiatric: He has a normal mood and affect. His behavior is normal. Judgment and thought content normal.   Assessment/Plan:  Incarcerated umbilical herniacausing small bowel obstruction The patient had a umbilical hernia that caused small bowel obstruction. It was repaired (10/21/2017).  -Started on miralax and senna -Continue incentive spirometry -Phenergan12.5 mg IV every 6 hours as needed -Reglan 10 mg as needed -Continue cardiac monitoring -Continue heparinsubcutaneous q8hrs -Pain control: Oxycodone 5 mg every 4 hours as needed. The patient only took one dose yesterday 10/23/17  Supraventricular tachycardia The patient had a few beats of vtach last night 10/23/17  -Resumed home medication  ofmetoprolol 50 mg twice daily, hydralazine 10 mg every 8 hours, diltiazem 360 XT daily, digoxin 0.25 mg daily at home. -Continue cardiac monitoring  Hypertension His blood pressureover the past 24 hours has ranged103-136/44-48, is normotensive -Continue metoprolol 50 mg twice daily -Continue hydralazine 10 mg every 8 hours -Continue diltiazem 360 mg daily -Continue digoxin 0.25 mg  Diabetes mellitus -Continue CBG monitoring. The patient's cbg this morning was 69 fasting. Encourage po intake.  Urinary Retention: Patient has pre-existing BPH -Continue on coudecatheter -continue doxazosin 2mg  qd -Continue oxybutynin 5mg  tid -Discharge home with Foley catheter per urology recommendation. The patient was also told to follow-up in 1-2 weeks for Foley catheter removal and trial of void.  Dispo: Anticipated discharge in approximately 1-2 day(s).   Lorenso CourierVahini Adore Kithcart, MD Internal Medicine PGY1 [phone] 10/24/2017, 11:15 AM

## 2017-10-24 NOTE — Progress Notes (Signed)
CSW spoke with patient's daughter to discuss bed offers and to determine facility preference. [name] CSW explained need to check on bed availability, and will follow up if there are no beds available. [name] requested that transportation be set up for patient whenever he is ready to transition, and to be informed of what's going on. [name] would also like to hear from the MD or RN to discuss what's going on with the patient prior to discharge, as well.  CSW will continue to follow.  Blenda NicelyElizabeth Bianca Vester, [place] Clinical Social Worker [phone]

## 2017-10-24 NOTE — Progress Notes (Signed)
Physical Therapy Treatment Patient Details Name: Nicholas [name] MRN: [id number] DOB: 01/14/1951 Today's Date: 10/24/2017    History of Present Illness Pt is a 66 y/o male admitted secondary to SBO from umbilical hernia. Pt is s/p hernia repair on 11/12. Pt also experiencing urinary retention, therefore, per urology notes will be d/c'd with foley catheter. PMH includes CAD, CVA, MI, HTN, PVD, DM, BPH, and s/p coronary angioplasty.     PT Comments    Pt reports he is feeling better today after extreme ABD discomfort 1DA. He was able to AMB with RN 1DA 300+ feet. Today pt performs all functional mobility at minguard to supervision level of assistance. 5xSTS: 12sec. He demonstrates no significant limitations in tolerance to sustained AMB, but reports all mobility to be at baseline. He continues to demonstrate strong gait deviations, likely chronic based on fluency and amount of Lt recurvatum. He reports that his gait deviations on this date are consistent with his baseline gait patterns. He reports history of multiple falls at home, which can be addressed in a HHPT to OPPT setting. PT recommendations updated this session, however no family in room to confirm pt self report.       Follow Up Recommendations  Supervision - Intermittent;Home health PT     Equipment Recommendations  None recommended by PT(pt uses a rollator at baseline at home.)    Recommendations for Other Services       Precautions / Restrictions Precautions Precautions: Fall Precaution Comments: Has had multiple falls at home.  Restrictions Weight Bearing Restrictions: No    Mobility  Bed Mobility Overal bed mobility: Independent                Transfers Overall transfer level: Modified independent Equipment used: Rolling walker (2 wheeled) Transfers: Sit to/from Stand Sit to Stand: Supervision         General transfer comment: performs 10x with minimal effort required, minimal UE use. 5xSTS: 12sec; no  LOB noted.   Ambulation/Gait Ambulation/Gait assistance: Supervision;Min guard Ambulation Distance (Feet): 360 Feet Assistive device: Rolling walker (2 wheeled) Gait Pattern/deviations: Step-through pattern;Decreased stride length;Shuffle;Festinating;Trunk flexed Gait velocity: 0.4341m/s    General Gait Details: Intermittent festination during turns and navigation around obstacles; LLE recurvatum with straight knee in swing phase likely compensatory secondary to knee buckling instability at baseline. Pt reports he walks this distance daily to the mailbox. he reports his gait is consistent with how he has always walked. He reports mobility to be at baseline.    Stairs            Wheelchair Mobility    Modified Rankin (Stroke Patients Only)       Balance Overall balance assessment: Needs assistance;No apparent balance deficits (not formally assessed)   Sitting balance-Leahy Scale: Good     Standing balance support: Bilateral upper extremity supported;During functional activity Standing balance-Leahy Scale: Fair                              Cognition Arousal/Alertness: Awake/alert Behavior During Therapy: WFL for tasks assessed/performed Overall Cognitive Status: No family/caregiver present to determine baseline cognitive functioning                                 General Comments: 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       Exercises      General Comments        Pertinent Vitals/Pain Pain Assessment: No/denies pain Pain Intervention(s): Monitored during session    Home Living                      Prior Function            PT Goals (current goals can now be found in the care plan section) Acute Rehab PT Goals Patient Stated Goal: to get this catheter out  PT Goal Formulation: With patient Time For  Goal Achievement: 11/05/17 Potential to Achieve Goals: Good Progress towards PT goals: Progressing toward goals    Frequency    Min 2X/week      PT Plan Discharge plan needs to be updated    Co-evaluation              AM-PAC PT "6 Clicks" Daily Activity  Outcome Measure  Difficulty turning over in bed (including adjusting bedclothes, sheets and blankets)?: None Difficulty moving from lying on back to sitting on the side of the bed? : None Difficulty sitting down on and standing up from a chair with arms (e.g., wheelchair, bedside commode, etc,.)?: None Help needed moving to and from a bed to chair (including a wheelchair)?: A Little Help needed walking in hospital room?: A Little Help needed climbing 3-5 steps with a railing? : A Little 6 Click Score: 21    End of Session Equipment Utilized During Treatment: Gait belt Activity Tolerance: Patient tolerated treatment well Patient left: in chair;with call bell/phone within reach Nurse Communication: Mobility status PT Visit Diagnosis: Unsteadiness on feet (R26.81);Muscle weakness (generalized) (M62.81);History of falling (Z91.81);Repeated falls (R29.6);Other abnormalities of gait and mobility (R26.89)     Time: 1411-1430 PT Time Calculation (min) (ACUTE ONLY): 19 min  Charges:  $Therapeutic Activity: 8-22 mins                    G Codes:       2:47 PM, 10/24/17 Rosamaria LintsAllan C Buccola, PT, DPT Relief Physical Therapist - [place] [phone] (Pager)  [phone] (Mobile)  [phone] (Office)      Buccola,Allan C 10/24/2017, 2:42 PM

## 2017-10-25 ENCOUNTER — Inpatient Hospital Stay (HOSPITAL_COMMUNITY): Payer: Medicare Other

## 2017-10-25 DIAGNOSIS — K5900 Constipation, unspecified: Secondary | ICD-10-CM

## 2017-10-25 LAB — GLUCOSE, CAPILLARY: GLUCOSE-CAPILLARY: 90 mg/dL (ref 65–99)

## 2017-10-25 MED ORDER — MINERAL OIL RE ENEM: 1.0000 | ENEMA | RECTAL | 3 refills | Status: DC | PRN

## 2017-10-25 MED ORDER — MINERAL OIL RE ENEM
1.0000 | ENEMA | Freq: Once | RECTAL | Status: AC
Start: 2017-10-25 — End: 2017-10-25
  Administered 2017-10-25: 1 via RECTAL
  Filled 2017-10-25: qty 1

## 2017-10-25 MED ORDER — MAGNESIUM CITRATE PO SOLN
1.0000 | Freq: Once | ORAL | Status: AC
  Administered 2017-10-25: 1 via ORAL
  Filled 2017-10-25: qty 296

## 2017-10-25 MED ORDER — DOCUSATE SODIUM 100 MG PO CAPS
100.0000 mg | ORAL_CAPSULE | Freq: Two times a day (BID) | ORAL | Status: DC
  Administered 2017-10-25 – 2017-10-28 (×7): 100 mg via ORAL
  Filled 2017-10-25 (×7): qty 1

## 2017-10-25 MED ORDER — POLYETHYLENE GLYCOL 3350 17 G PO PACK: 17.0000 g | PACK | Freq: Every day | ORAL | 0 refills | Status: DC | PRN

## 2017-10-25 NOTE — Progress Notes (Signed)
Subjective: [name] was seen this morning moving from his bed to chair this morning with assistance from nurse tech. The patient was very upset this morning and stated that he did not want to be discharged home. He stated that his home is not clean and he feels that he will get an infection from being there. The IMTS team spoke with patient at length that the patient is currently stable for discharge and he does not need to remain in the hospital. He is at higher risk of hospital acquired infection.  Objective:  Vital signs in last 24 hours: Vitals:   10/24/17 1137 10/24/17 1401 10/24/17 2148 10/25/17 0457  BP: (!) 143/57 (!) 140/47 104/72 (!) 131/45  Pulse: 69 62 61 (!) 55  Resp:  18 18 18   Temp:  98.1 F (36.7 C) 98.4 F (36.9 C) 98.6 F (37 C)  TempSrc:  Oral Oral Oral  SpO2: 98% 98% 97% 97%  Weight:      Height:       Physical Exam  Constitutional: He appears well-developed and well-nourished. No distress.  HENT:  Head: Normocephalic and atraumatic.  Eyes: Conjunctivae are normal.  Cardiovascular: Normal rate, regular rhythm and normal heart sounds.  Pulmonary/Chest: Effort normal and breath sounds normal. No stridor. No respiratory distress.  Abdominal: Soft. Bowel sounds are normal. He exhibits no distension. There is no tenderness.  Musculoskeletal: He exhibits no edema.  Neurological: He is alert.  Patient had shuffling gait when ambulating from bed to chair  Skin: He is not diaphoretic.  Psychiatric: He has a normal mood and affect. His behavior is normal. Judgment and thought content normal.    Assessment/Plan:  Small bowel obstruction caused by incarcerated umbilical hernia  -mineral oil enema today -ordered KUB to assess for any obstruction that is preventing patient from having a bowel movement. -PT evaluation again to ensure that the patient can be independent at home. The patient and his daughter feel that he cannot move around independently at home.    -continued ambulation recommended   Supraventricular tachycardia The patient had a few beats of pvc's and bradycardia noted over past 24 hrs.  -Resumed home medication ofmetoprolol 50 mg twice daily, hydralazine 10 mg every 8 hours, diltiazem 360 XT daily, digoxin 0.25 mg daily at home. -Continue cardiac monitoring  Hypertension His blood pressureover the past 24 hours has ranged 104-143/57-72, is normotensive.  -Continue metoprolol 50 mg twice daily -Continue hydralazine 10 mg every 8 hours -Continue diltiazem 360 mg daily -Continue digoxin 0.25 mg  Diabetes mellitus -ContinueCBG monitoring. The patient's cbg this morning 10/25/17 was 90.  Urinary Retention: The patient was found to have urinary retention on 10/19/17 and 10/20/17. There was >700cc of urine that was emptied from his bladder. The patient was placed on a coude catheter on 10/21/17 with difficulty.  -Continue oncoudecatheter -continue doxazosin 2mg  qd -Continueoxybutynin 5mg  tid -Discharge home with Foley catheter per urology recommendation. The patient was also told to follow-up in 1-2 weeks for Foley catheter removal and trial of void. The patient expressed that he is uncomfortable to be discharged with catheter and he wants a trial void prior to discharge. I spoke with urology and Dr. Sherryl BartersBudzyn expressed that the patient needs the catheter to remain in place as the bladder needs time to rest and it was very difficult for the catheter to be placed initially. The catheter needs to be removed or replaced in a controlled setting in 1-2 weeks.   Dispo: Anticipated discharge  today. Case management mentioned that the patient has refused home health services.  Nicholas CourierVahini Eddye Broxterman, MD Internal Medicine PGY1 Pager:[phone] 10/25/2017, 8:29 AM

## 2017-10-25 NOTE — Care Management Important Message (Signed)
Important Message  Patient Details  Name: Nicholas [name] MRN: [id number] Date of Birth: 03/29/1951   Medicare Important Message Given:  Yes    Kyla BalzarineShealy, Kimyatta Lecy Abena 10/25/2017, 12:43 PM

## 2017-10-25 NOTE — Progress Notes (Signed)
Spoke to surgery regarding KUB results and whether further intervention needs to be done. Surgery mentioned that they will see patient today 10/25/17. Will follow with surgery recommendation. Appreciate their assistance.  Lorenso CourierVahini Clanton Emanuelson, MD Internal Medicine PGY1 Pager:[phone] 10/25/2017, 2:28 PM

## 2017-10-25 NOTE — Progress Notes (Signed)
[facility] Progress Note  4 Days Post-Op  Subjective: CC: constipation Patient having difficulty trying to have a BM, had a small one 2 days ago but had to strain. Patient is tolerating diet, denies n/v. Passing flatus. Does not feel like belly is bloated. Patient had an enema this AM and wanting to get up and sit in the bathroom while I was present in room.   Objective: Vital signs in last 24 hours: Temp:  [98.4 F (36.9 C)-98.6 F (37 C)] 98.6 F (37 C) (11/16 0457) Pulse Rate:  [55-61] 60 (11/16 1130) Resp:  [18] 18 (11/16 0457) BP: (104-155)/(44-72) 155/44 (11/16 1130) SpO2:  [97 %-98 %] 98 % (11/16 1130) Last BM Date: 10/23/17  Intake/Output from previous day: 11/15 0701 - 11/16 0700 In: 1527.8 [P.O.:957; I.V.:570.8] Out: 4225 [Urine:4225] Intake/Output this shift: Total I/O In: 550 [P.O.:550] Out: 1575 [Urine:1575]  PE: Gen:  Alert, NAD, pleasant Card:  Regular rate and rhythm, pedal pulses 2+ BL Pulm:  Normal effort, clear to auscultation bilaterally Abd: Soft, non-tender, non-distended, bowel sounds present, no HSM, incisions C/D/I Skin: warm and dry, no rashes  Psych: A&Ox3   Lab Results:  No results for input(s): WBC, HGB, HCT, PLT in the last 72 hours. BMET No results for input(s): NA, K, CL, CO2, GLUCOSE, BUN, CREATININE, CALCIUM in the last 72 hours. PT/INR No results for input(s): LABPROT, INR in the last 72 hours. CMP     Component Value Date/Time   NA 138 10/21/2017 0527   K 3.8 10/21/2017 0527   CL 105 10/21/2017 0527   CO2 27 10/21/2017 0527   GLUCOSE 102 (H) 10/21/2017 0527   BUN 5 (L) 10/21/2017 0527   CREATININE 0.91 10/21/2017 0527   CALCIUM 8.5 (L) 10/21/2017 0527   PROT 5.1 (L) 10/20/2017 0358   ALBUMIN 2.9 (L) 10/20/2017 0358   AST 24 10/20/2017 0358   ALT 20 10/20/2017 0358   ALKPHOS 72 10/20/2017 0358   BILITOT 1.0 10/20/2017 0358   GFRNONAA >60 10/21/2017 0527   GFRAA >60 10/21/2017 0527   Lipase     Component  Value Date/Time   LIPASE 21 10/17/2017 1443       Studies/Results: Dg Abd 1 View  Result Date: 10/25/2017 CLINICAL DATA:  66 year old male with high-grade small bowel obstruction related to umbilical hernia on recent CT Abdomen and Pelvis. Status post umbilical hernia repair 10/21/2017. EXAM: ABDOMEN - 1 VIEW COMPARISON:  CT Abdomen and Pelvis 10/17/2017. FINDINGS: Supine view. Improved bowel gas pattern since 10/17/2017, including more large bowel gas (splenic flexure and rectum). Upper limits of normal to mildly dilated gas-filled small bowel still in the left abdomen. Elsewhere abdominal visceral contours are paired are stable. No acute osseous abnormality identified. Vascular calcifications in the pelvis. IMPRESSION: Improved bowel gas pattern since 10/17/2017. Residual upper limits of normal to mildly small bowel in the left abdomen might reflect ileus or less likely residual partial obstruction. Electronically Signed   By: Odessa FlemingH  Hall M.D.   On: 10/25/2017 13:56    Anti-infectives: Anti-infectives (From admission, onward)   Start     Dose/Rate Route Frequency Ordered Stop   10/21/17 0800  ceFAZolin (ANCEF) IVPB 2g/100 mL premix     2 g 200 mL/hr over 30 Minutes Intravenous To Short Stay 10/20/17 2314 10/21/17 0957   10/17/17 1630  piperacillin-tazobactam (ZOSYN) IVPB 3.375 g     3.375 g 100 mL/hr over 30 Minutes Intravenous  Once 10/17/17 1621 10/17/17 1716  Assessment/Plan Umbilical hernia with incarcerated omentum - s/p Umbilical hernia repair, 10/21/17 Dr. Molli HazardMatthew TsueiPOD 4 Constipation- patient with soft belly, +BS, no distention, and passing flatus - I do not think this is an ileus or obstruction  - XR today: improvement since 11/8, might be ileus vs residual PSBO - have added BID colace to help with hard stools - magnesium citrate x1 - can do enemas prn  Chronic illness with 100 lb weight loss since April 2018 Recent In Pt rehab for lumbar radiculopathy SVTthis  admission - on home medsmetoprolol, diltiazem and digoxin. Consider cardiology consult if SVT persists. Currently NSR Urinary retention - foley in place CAD/Hx ofMI/history of PSVT - holdingPlavix last dose 10/16/17 Aortic and mitral regurgitation./ EF 60-65% 03/2017 Echo Carotid stenosis and occlusion with hx of CVA PVOD - s/p Left Fem-pop BPG Type II diabetes Hypertension Probable malnutrition and deconditioning Hx of tobacco use  FEN:IVF,Regular diet ID: 1 dose of Zosyn11/8then DC'ed. Ancef 10/21/17 x 1 dose WUJ:WJXBJYNVT:Heparin Foley:placed for urinary retention Follow up: TBD   Plan: colace, mag citrate. Enemas prn. Would have patient continue bowel regimen of daily colace and miralax as needed once discharged.    LOS: 7 days    Wells GuilesKelly Rayburn , [facility] 10/25/2017, 2:58 PM Pager: [phone] Consults: [phone] Mon-Fri 7:00 am-4:30 pm Sat-Sun 7:00 am-11:30 am

## 2017-10-25 NOTE — Discharge Instructions (Signed)
It was a pleasure to take care of you [name]. During this admission you were taken care of for obstruction in your intestines. Please take the laxatives that have been prescribed.   -Follow up with surgery 11/05/17 -Follow up with urology, call to follow up -Follow up with primary care physician-we have stopped your glipizide please readdress your diabetes medication at your follow up appointment  [facility] _______[facility]  UMBILICAL OR INGUINAL HERNIA REPAIR: POST OP INSTRUCTIONS  Always review your discharge instruction sheet given to you by the facility where your surgery was performed. IF YOU HAVE DISABILITY OR FAMILY LEAVE FORMS, YOU MUST BRING THEM TO THE OFFICE FOR PROCESSING.   DO NOT GIVE THEM TO YOUR DOCTOR.  1. A  prescription for pain medication may be given to you upon discharge.  Take your pain medication as prescribed, if needed.  If narcotic pain medicine is not needed, then you may take acetaminophen (Tylenol) or ibuprofen (Advil) as needed. 2. Take your usually prescribed medications unless otherwise directed. If you need a refill on your pain medication, please contact your pharmacy.  They will contact our office to request authorization. Prescriptions will not be filled after 5 pm or on week-ends. 3. You should follow a light diet the first 24 hours after arrival home, such as soup and crackers, etc.  Be sure to include lots of fluids daily.  Resume your normal diet the day after surgery. 4.Most patients will experience some swelling and bruising around the umbilicus or in the groin and scrotum.  Ice packs and reclining will help.  Swelling and bruising can take several days to resolve.  6. It is common to experience some constipation if taking pain medication after surgery.  Increasing fluid intake and taking a stool softener (such as Colace) will usually help or prevent this problem from occurring.  A mild laxative (Milk of Magnesia or Miralax) should be  taken according to package directions if there are no bowel movements after 48 hours. 7. Unless discharge instructions indicate otherwise, you may remove your bandages 24-48 hours after surgery, and you may shower at that time.  You may have steri-strips (small skin tapes) in place directly over the incision.  These strips should be left on the skin for 7-10 days.  If your surgeon used skin glue on the incision, you may shower in 24 hours.  The glue will flake off over the next 2-3 weeks.  Any sutures or staples will be removed at the office during your follow-up visit. 8. ACTIVITIES:  You may resume regular (light) daily activities beginning the next day--such as daily self-care, walking, climbing stairs--gradually increasing activities as tolerated.  You may have sexual intercourse when it is comfortable.  Refrain from any heavy lifting or straining until approved by your doctor.  a.You may drive when you are no longer taking prescription pain medication, you can comfortably wear a seatbelt, and you can safely maneuver your car and apply brakes. b.RETURN TO WORK:   _____________________________________________  9.You should see your doctor in the office for a follow-up appointment approximately 2-3 weeks after your surgery.  Make sure that you call for this appointment within a day or two after you arrive home to insure a convenient appointment time. 10.OTHER INSTRUCTIONS: _________________________    _____________________________________  WHEN TO CALL YOUR DOCTOR: 1. Fever over 101.0 2. Inability to urinate 3. Nausea and/or vomiting 4. Extreme swelling or bruising 5. Continued bleeding from incision. 6. Increased pain, redness, or  drainage from the incision  The clinic staff is available to answer your questions during regular business hours.  Please dont hesitate to call and ask to speak to one of the nurses for clinical concerns.  If you have a medical emergency, go to the nearest emergency  room or call 911.  [facility] is always on call at the hospital   [address], [address], [place], [place]  [address] ?  [address] [address], [place], [place]   [address] [phone] ? [phone] ? FAX [phone] Web site: [email or web address]

## 2017-10-25 NOTE — Care Management Note (Signed)
Case Management Note  Patient Details  Name: Erin FullingWillie J Llerenas MRN: [id number] Date of Birth: 05/24/1951  Subjective/Objective:                    Action/Plan:   Expected Discharge Date:                  Expected Discharge Plan:  Home w Home Health Services  In-House Referral:     Discharge planning Services  CM Consult  Post Acute Care Choice:  Home Health Choice offered to:  Patient, Adult Children  DME Arranged:    DME Agency:     HH Arranged:  Patient Refused HH HH Agency:     Status of Service:  Completed, signed off  If discussed at [employer] Length of Stay Meetings, dates discussed:    Additional Comments:  Kingsley PlanWile,  Antolin Marie, RN 10/25/2017, 10:00 AM

## 2017-10-26 DIAGNOSIS — K56609 Unspecified intestinal obstruction, unspecified as to partial versus complete obstruction: Secondary | ICD-10-CM

## 2017-10-26 DIAGNOSIS — I471 Supraventricular tachycardia: Secondary | ICD-10-CM

## 2017-10-26 DIAGNOSIS — I10 Essential (primary) hypertension: Secondary | ICD-10-CM

## 2017-10-26 DIAGNOSIS — Z7984 Long term (current) use of oral hypoglycemic drugs: Secondary | ICD-10-CM

## 2017-10-26 DIAGNOSIS — Z79899 Other long term (current) drug therapy: Secondary | ICD-10-CM

## 2017-10-26 DIAGNOSIS — Z960 Presence of urogenital implants: Secondary | ICD-10-CM

## 2017-10-26 DIAGNOSIS — R339 Retention of urine, unspecified: Secondary | ICD-10-CM

## 2017-10-26 DIAGNOSIS — Z8719 Personal history of other diseases of the digestive system: Secondary | ICD-10-CM

## 2017-10-26 DIAGNOSIS — E119 Type 2 diabetes mellitus without complications: Secondary | ICD-10-CM

## 2017-10-26 LAB — GLUCOSE, CAPILLARY: GLUCOSE-CAPILLARY: 84 mg/dL (ref 65–99)

## 2017-10-26 MED ORDER — FLEET ENEMA 7-19 GM/118ML RE ENEM
1.0000 | ENEMA | Freq: Once | RECTAL | Status: AC
  Administered 2017-10-26: 1 via RECTAL
  Filled 2017-10-26: qty 1

## 2017-10-26 MED ORDER — MAGNESIUM CITRATE PO SOLN
1.0000 | Freq: Once | ORAL | Status: AC
  Administered 2017-10-26: 1 via ORAL
  Filled 2017-10-26: qty 296

## 2017-10-26 NOTE — Progress Notes (Addendum)
Daughter, [name] in to see pt earlier and updated her on the plan of care.  Pt has had multiple BMs today following laxatives and enema.  Pt states he feels much better after having the BMs. Also, daughter stated that pt's primary doctor had ordered a CT lung for 11/14 which was not done because he was in the hospital this admission.

## 2017-10-26 NOTE — Progress Notes (Signed)
[facility] Progress Note  5 Days Post-Op  Subjective: CC: constipation Patient tells me he has issues with chronic constipation. Still no BM. Passing flatus. Denies abdominal pain, n/v, bloating. Patient seems nervous about going home.  VSS.   Objective: Vital signs in last 24 hours: Temp:  [98.5 F (36.9 C)-99 F (37.2 C)] 98.8 F (37.1 C) (11/17 0541) Pulse Rate:  [60-64] 60 (11/17 0541) Resp:  [16] 16 (11/17 0541) BP: (118-155)/(41-48) 138/46 (11/17 0541) SpO2:  [98 %-99 %] 98 % (11/17 0541) Last BM Date: 10/25/17  Intake/Output from previous day: 11/16 0701 - 11/17 0700 In: 2910 [P.O.:1510; I.V.:1400] Out: 4685 [Urine:4685] Intake/Output this shift: Total I/O In: -  Out: 550 [Urine:550]  PE: Gen:  Alert, NAD, pleasant Card:  Regular rate and rhythm, pedal pulses 2+ BL Pulm:  Normal effort, clear to auscultation bilaterally Abd: Soft, non-tender, non-distended, bowel sounds present, no HSM, incisions C/D/I Skin: warm and dry, no rashes  Psych: A&Ox3   Lab Results:  No results for input(s): WBC, HGB, HCT, PLT in the last 72 hours. BMET No results for input(s): NA, K, CL, CO2, GLUCOSE, BUN, CREATININE, CALCIUM in the last 72 hours. PT/INR No results for input(s): LABPROT, INR in the last 72 hours. CMP     Component Value Date/Time   NA 138 10/21/2017 0527   K 3.8 10/21/2017 0527   CL 105 10/21/2017 0527   CO2 27 10/21/2017 0527   GLUCOSE 102 (H) 10/21/2017 0527   BUN 5 (L) 10/21/2017 0527   CREATININE 0.91 10/21/2017 0527   CALCIUM 8.5 (L) 10/21/2017 0527   PROT 5.1 (L) 10/20/2017 0358   ALBUMIN 2.9 (L) 10/20/2017 0358   AST 24 10/20/2017 0358   ALT 20 10/20/2017 0358   ALKPHOS 72 10/20/2017 0358   BILITOT 1.0 10/20/2017 0358   GFRNONAA >60 10/21/2017 0527   GFRAA >60 10/21/2017 0527   Lipase     Component Value Date/Time   LIPASE 21 10/17/2017 1443       Studies/Results: Dg Abd 1 View  Result Date: 10/25/2017 CLINICAL DATA:   66 year old male with high-grade small bowel obstruction related to umbilical hernia on recent CT Abdomen and Pelvis. Status post umbilical hernia repair 10/21/2017. EXAM: ABDOMEN - 1 VIEW COMPARISON:  CT Abdomen and Pelvis 10/17/2017. FINDINGS: Supine view. Improved bowel gas pattern since 10/17/2017, including more large bowel gas (splenic flexure and rectum). Upper limits of normal to mildly dilated gas-filled small bowel still in the left abdomen. Elsewhere abdominal visceral contours are paired are stable. No acute osseous abnormality identified. Vascular calcifications in the pelvis. IMPRESSION: Improved bowel gas pattern since 10/17/2017. Residual upper limits of normal to mildly small bowel in the left abdomen might reflect ileus or less likely residual partial obstruction. Electronically Signed   By: Odessa FlemingH  Hall M.D.   On: 10/25/2017 13:56    Anti-infectives: Anti-infectives (From admission, onward)   Start     Dose/Rate Route Frequency Ordered Stop   10/21/17 0800  ceFAZolin (ANCEF) IVPB 2g/100 mL premix     2 g 200 mL/hr over 30 Minutes Intravenous To Short Stay 10/20/17 2314 10/21/17 0957   10/17/17 1630  piperacillin-tazobactam (ZOSYN) IVPB 3.375 g     3.375 g 100 mL/hr over 30 Minutes Intravenous  Once 10/17/17 1621 10/17/17 1716       Assessment/Plan Umbilical hernia with incarcerated omentum - s/p Umbilical hernia repair, 10/21/17 Dr. Salome SpottedMatthew TsueiPOD4 Constipation- patient with soft belly, +BS, no distention, and passing flatus -  I do not think this is an ileus or obstruction  - XR yesterday: improvement since 11/8, might be ileus vs residual PSBO - have added BID colace to help with hard stools - magnesium citrate again - can do enemas prn - this is a chronic issue  Chronic illness with 100 lb weight loss since April 2018 Recent In Pt rehab for lumbar radiculopathy SVTthis admission - on home medsmetoprolol, diltiazem and digoxin. Consider cardiology consult if SVT  persists. Currently NSR Urinary retention - foley in place CAD/Hx ofMI/history of PSVT - holdingPlavix last dose 10/16/17 Aortic and mitral regurgitation./ EF 60-65% 03/2017 Echo Carotid stenosis and occlusion with hx of CVA PVOD - s/p Left Fem-pop BPG Type II diabetes Hypertension Probable malnutrition and deconditioning Hx of tobacco use  FEN:IVF,Regular diet ID: 1 dose of Zosyn11/8then DC'ed. Ancef 10/21/17 x 1 dose WUJ:WJXBJYNVT:Heparin Foley:placed for urinary retention Follow up: TBD   Plan: colace, mag citrate. Enemas prn. Would have patient continue bowel regimen of daily senokot, colace and miralax as needed once discharged.    LOS: 8 days    Wells GuilesKelly Rayburn , [facility] 10/26/2017, 8:41 AM Pager: [phone] Consults: [phone] Mon-Fri 7:00 am-4:30 pm Sat-Sun 7:00 am-11:30 am

## 2017-10-26 NOTE — Progress Notes (Signed)
S/W [name], PT about pt and re-evaluation of needs.

## 2017-10-26 NOTE — Care Management Note (Signed)
Case Management Note  Patient Details  Name: Erin FullingWillie J Pen MRN: [id number] Date of Birth: 10/22/1951  Subjective/Objective:  Received call from daughter, asking how she can go about having father placed in AL. Pt has been evaluated by PT and ambulated 360', also refused HH services when offered. CM made several suggestions, 1) hhSW, which she declined stating he was unable to let them inside his apt, and he had no family to assist him, 2) private duty staff that can be hired to assist until she can call local facilities for availability, 3) friends, family and faith community that can assist until he can find an appropriate facility.   Daughter did not seem to accept any of these solutions and therefore CM offered no others.                 Action/Plan: CM will sign off for now but will be available should additional discharge needs arise or disposition change.    Expected Discharge Date:  10/26/17               Expected Discharge Plan:  Home w Home Health Services  In-House Referral:     Discharge planning Services  CM Consult  Post Acute Care Choice:  Home Health Choice offered to:  Patient, Adult Children  DME Arranged:    DME Agency:     HH Arranged:  Patient Refused HH HH Agency:     Status of Service:  Completed, signed off  If discussed at [employer] Length of Stay Meetings, dates discussed:    Additional Comments:  Yvone NeuCrutchfield, Evalyn Shultis M, RN 10/26/2017, 12:10 PM

## 2017-10-26 NOTE — Progress Notes (Signed)
Physical Therapy Treatment Patient Details Name: Nicholas [name] MRN: [id number] DOB: 03/27/1951 Today's Date: 10/26/2017    History of Present Illness Pt is a 66 y/o male admitted secondary to SBO from umbilical hernia. Pt is s/p hernia repair on 11/12. Pt also experiencing urinary retention, therefore, per urology notes will be d/c'd with foley catheter. PMH includes CAD, CVA, MI, HTN, PVD, DM, BPH, and s/p coronary angioplasty.     PT Comments    Pt progressing slowly towards physical therapy goals. Pt lives alone with intermittent support from family. Feel at this time he will require 24 hour supervision for safety, ADL's, and management of medications. Pt is at risk for falls, and reports several falls at home in the recent months. Unsure of the nature of his falls - when asked, pt reports he "lets go of the walker to get in his apartment before the bees get him". Pt with several comments regarding excessive numbers of bugs at home, but question how accurate this information is. Noted decreased cognition at baseline due to prior stroke, and at this time I am concerned with his ability to perform IADL's such as cooking (prolonged ability to stand at a stove, turning stove on but forgetting to turn the stove off, etc), medication management, and now catheter management. Pt admits to having difficulty with medications even with daughter sorting them into pill containers for him. Feel this patient would benefit from continued rehab at the SNF level to increase independence with mobility, self-care, and IADL's. Physician paged with updated discharge recommendations.   Follow Up Recommendations  SNF;Supervision/Assistance - 24 hour     Equipment Recommendations  Rolling walker with 5" wheels    Recommendations for Other Services       Precautions / Restrictions Precautions Precautions: Fall Precaution Comments: Has had multiple falls at home.  Restrictions Weight Bearing Restrictions: No     Mobility  Bed Mobility Overal bed mobility: Needs Assistance Bed Mobility: Supine to Sit;Sit to Supine     Supine to sit: Supervision Sit to supine: Supervision   General bed mobility comments: Supervision for safety - assist for management of catheter  Transfers Overall transfer level: Needs assistance Equipment used: Rolling walker (2 wheeled) Transfers: Sit to/from Stand Sit to Stand: Supervision         General transfer comment: Close supervision for safety. Pt requires immediate UE support upon stand, but no assist required to power-up.   Ambulation/Gait Ambulation/Gait assistance: Min guard Ambulation Distance (Feet): 200 Feet Assistive device: Rolling walker (2 wheeled) Gait Pattern/deviations: Step-through pattern;Decreased stride length;Shuffle;Festinating;Trunk flexed Gait velocity: Decreased Gait velocity interpretation: Below normal speed for age/gender General Gait Details: Intermittent festination with wide BOS during initiation of movement, turns, and navigation around obstacles.   Stairs            Wheelchair Mobility    Modified Rankin (Stroke Patients Only)       Balance Overall balance assessment: Needs assistance;No apparent balance deficits (not formally assessed) Sitting-balance support: No upper extremity supported;Feet supported Sitting balance-Leahy Scale: Good     Standing balance support: Bilateral upper extremity supported;During functional activity Standing balance-Leahy Scale: Fair Standing balance comment: Reliant on BUE support.                             Cognition Arousal/Alertness: Awake/alert Behavior During Therapy: WFL for tasks assessed/performed Overall Cognitive Status: No family/caregiver present to determine baseline cognitive functioning Area of Impairment: Orientation;Attention;Memory;Following  commands;Safety/judgement;Awareness;Problem solving                 Orientation Level:  Disoriented to;Time;Situation Current Attention Level: Selective Memory: Decreased recall of precautions Following Commands: Follows one step commands consistently;Follows one step commands with increased time;Follows multi-step commands inconsistently Safety/Judgement: Decreased awareness of safety;Decreased awareness of deficits Awareness: Emergent Problem Solving: Slow processing;Decreased initiation;Difficulty sequencing;Requires verbal cues General Comments: Reports some cognitive changes associated with remote CVA (agraphia, expressive aphasia), some of which has recovered, but to what extent in unclear at this time.       Exercises      General Comments        Pertinent Vitals/Pain Pain Assessment: No/denies pain    Home Living                      Prior Function            PT Goals (current goals can now be found in the care plan section) Acute Rehab PT Goals Patient Stated Goal: to get this catheter out  PT Goal Formulation: With patient Time For Goal Achievement: 11/05/17 Potential to Achieve Goals: Good Progress towards PT goals: Progressing toward goals    Frequency    Min 2X/week      PT Plan Discharge plan needs to be updated    Co-evaluation              AM-PAC PT "6 Clicks" Daily Activity  Outcome Measure  Difficulty turning over in bed (including adjusting bedclothes, sheets and blankets)?: None Difficulty moving from lying on back to sitting on the side of the bed? : None Difficulty sitting down on and standing up from a chair with arms (e.g., wheelchair, bedside commode, etc,.)?: A Little Help needed moving to and from a bed to chair (including a wheelchair)?: A Little Help needed walking in hospital room?: A Little Help needed climbing 3-5 steps with a railing? : A Lot 6 Click Score: 19    End of Session Equipment Utilized During Treatment: Gait belt Activity Tolerance: Patient tolerated treatment well Patient left: in  bed;with call bell/phone within reach;with bed alarm set Nurse Communication: Mobility status PT Visit Diagnosis: Unsteadiness on feet (R26.81);Muscle weakness (generalized) (M62.81);History of falling (Z91.81);Repeated falls (R29.6);Other abnormalities of gait and mobility (R26.89)     Time: 1430-1455 PT Time Calculation (min) (ACUTE ONLY): 25 min  Charges:  $Gait Training: 23-37 mins                    G Codes:       Nicholas [name], PT, DPT Acute Rehabilitation Services Pager: [phone]    Nicholas [name] 10/26/2017, 3:06 PM

## 2017-10-26 NOTE — Progress Notes (Signed)
S/W daughter about pt, she states he is unsafe at home (falling, medication management and forgets if he has already taken pills, possilby over medicating).  He has resolved within himself that he can no longer stay at home independently.  Gave dtr case management # to call and talk about any options available to them.

## 2017-10-26 NOTE — Progress Notes (Signed)
Paged Conni SlipperLaura Kirkman at [phone] regarding PT eval.

## 2017-10-26 NOTE — Progress Notes (Addendum)
   Subjective: [name] was seen laying in his bed this morning. He continued to be upset about being discharged. He stated that he did not have any bowel movements yesterday.  Objective:  Vital signs in last 24 hours: Vitals:   10/25/17 1130 10/25/17 1425 10/25/17 2036 10/26/17 0541  BP: (!) 155/44 (!) 131/48 (!) 118/41 (!) 138/46  Pulse: 60 64 61 60  Resp:  16 16 16   Temp:  98.5 F (36.9 C) 99 F (37.2 C) 98.8 F (37.1 C)  TempSrc:  Oral Oral Oral  SpO2: 98% 98% 99% 98%  Weight:      Height:       Physical Exam  Constitutional: He is oriented to person, place, and time. He appears well-developed and well-nourished. No distress.  HENT:  Head: Normocephalic and atraumatic.  Eyes: Conjunctivae are normal.  Cardiovascular: Normal rate, regular rhythm and normal heart sounds.  Pulmonary/Chest: Effort normal and breath sounds normal. No stridor. No respiratory distress.  Abdominal: Soft. Bowel sounds are normal. He exhibits no distension. There is no tenderness.  Neurological: He is alert and oriented to person, place, and time.  Skin: He is not diaphoretic.  Psychiatric: He has a normal mood and affect. His behavior is normal. Judgment and thought content normal.    Assessment/Plan:  Small bowel obstruction caused by incarcerated umbilical hernia  KUB 10/25/2017 did not show any presence of ileus or obstruction.  Patient needs extended duration of laxatives.  -Followed by surgery today who recommended continue a bowel regimen with daily senna, Colace, MiraLAX as needed -Mineral oil enema as needed -continued ambulation recommended   Supraventricular tachycardia The patient had a few beats of pvc's and bradycardia noted over past 24 hrs.  -Resumed home medication ofmetoprolol 50 mg twice daily, hydralazine 10 mg every 8 hours, diltiazem 360 XT daily, digoxin 0.25 mg daily at home. -Continue cardiac monitoring  Hypertension His blood pressureover the past 24 hours  has ranged 118-155/41-44, is normotensive.  -Continue metoprolol 50 mg twice daily -Continue hydralazine 10 mg every 8 hours -Continue diltiazem 360 mg daily -Continue digoxin 0.25 mg  Diabetes mellitus -ContinueCBG monitoring. The patient's cbg this morning 10/25/17 was 84 -Patient's home glipizide was held during this admission the patient has been having normal blood glucose levels.  Recommend following up with PCP and discussing whether she truly needs it.  Urinary Retention: The patient was placed on a coude catheter on 10/21/17 with difficulty.  -Continue oncoudecatheter -continue doxazosin 2mg  qd -Continueoxybutynin 5mg  tid -Discharge home with Foley catheter per urology recommendation and follow-up in 1-2 weeks for removal and trial of void.    Dispo: Anticipated discharge today.  Lorenso CourierVahini Chundi, MD Internal Medicine PGY1 Pager:[phone] 10/26/2017, 9:16 AM

## 2017-10-26 NOTE — Progress Notes (Signed)
    Subjective: Nicholas [name] was seen laying in his bed this morning. He continued to be upset about being discharged. He stated that he did not have any bowel movements yesterday.  Objective:  Vital signs in last 24 hours: Vitals:   10/25/17 1130 10/25/17 1425 10/25/17 2036 10/26/17 0541  BP: (!) 155/44 (!) 131/48 (!) 118/41 (!) 138/46  Pulse: 60 64 61 60  Resp:  16 16 16  Temp:  98.5 F (36.9 C) 99 F (37.2 C) 98.8 F (37.1 C)  TempSrc:  Oral Oral Oral  SpO2: 98% 98% 99% 98%  Weight:      Height:       Physical Exam  Constitutional: He is oriented to person, place, and time. He appears well-developed and well-nourished. No distress.  HENT:  Head: Normocephalic and atraumatic.  Eyes: Conjunctivae are normal.  Cardiovascular: Normal rate, regular rhythm and normal heart sounds.  Pulmonary/Chest: Effort normal and breath sounds normal. No stridor. No respiratory distress.  Abdominal: Soft. Bowel sounds are normal. He exhibits no distension. There is no tenderness.  Neurological: He is alert and oriented to person, place, and time.  Skin: He is not diaphoretic.  Psychiatric: He has a normal mood and affect. His behavior is normal. Judgment and thought content normal.    Assessment/Plan:  Small bowel obstruction caused by incarcerated umbilical hernia  KUB 10/25/2017 did not show any presence of ileus or obstruction.  Patient needs extended duration of laxatives.  -Followed by surgery today who recommended continue a bowel regimen with daily senna, Colace, MiraLAX as needed -Mineral oil enema as needed -continued ambulation recommended   Supraventricular tachycardia The patient had a few beats of pvc's and bradycardia noted over past 24 hrs.  -Resumed home medication ofmetoprolol 50 mg twice daily, hydralazine 10 mg every 8 hours, diltiazem 360 XT daily, digoxin 0.25 mg daily at home. -Continue cardiac monitoring  Hypertension His blood pressureover the past 24 hours  has ranged 118-155/41-44, is normotensive.  -Continue metoprolol 50 mg twice daily -Continue hydralazine 10 mg every 8 hours -Continue diltiazem 360 mg daily -Continue digoxin 0.25 mg  Diabetes mellitus -ContinueCBG monitoring. The patient's cbg this morning 10/25/17 was 84 -Patient's home glipizide was held during this admission the patient has been having normal blood glucose levels.  Recommend following up with PCP and discussing whether she truly needs it.  Urinary Retention: The patient was placed on a coude catheter on 10/21/17 with difficulty.  -Continue oncoudecatheter -continue doxazosin 2mg qd -Continueoxybutynin 5mg tid -Discharge home with Foley catheter per urology recommendation and follow-up in 1-2 weeks for removal and trial of void.    Dispo: Anticipated discharge today.  Jennie Hannay, MD Internal Medicine PGY1 Pager:[phone] 10/26/2017, 9:16 AM   

## 2017-10-26 NOTE — Progress Notes (Signed)
Called and talked with Dr. Delma Officerhundi about pt disposition.  Have talked to CSW, care management and the daughter about the pt.  Will try to get re-eval on PT.

## 2017-10-26 NOTE — Progress Notes (Signed)
Called from [facility] monitoring that pt having bigeminy, will continue to monitor this and told her to notify me if continues.

## 2017-10-26 NOTE — Progress Notes (Signed)
Spoke with the CSW about pt and his anxiety about going home with the catheter.  He states his rental apt is dirty and has dead bugs that he has killed all over.  [facility]  CSW states that if he feels that his apt is unsafe to live in, he can call the housing authority about that issue.

## 2017-10-26 NOTE — Progress Notes (Signed)
Spoke with Conni SlipperLaura Kirkman regarding PT re-evaluation for patient. The order was placed 10/25/17. He is imminent discharge. If patient meets criteria for snf then social work will work on placement. If the patient does not then he will be discharged. He has refused home health services.   Lorenso CourierVahini Shoshanna Mcquitty, MD Internal Medicine PGY1 Pager:[phone] 10/26/2017, 2:07 PM

## 2017-10-27 LAB — GLUCOSE, CAPILLARY: GLUCOSE-CAPILLARY: 76 mg/dL (ref 65–99)

## 2017-10-27 MED ORDER — HYDROCERIN EX CREA
TOPICAL_CREAM | Freq: Two times a day (BID) | CUTANEOUS | Status: DC
  Administered 2017-10-27 – 2017-10-28 (×2): via TOPICAL
  Filled 2017-10-27: qty 113

## 2017-10-27 NOTE — Progress Notes (Addendum)
    Subjective:   NAEON. Had bowel movements yesterday. Feeling well today- denies n/v. Tolerating diet. Stable for discharge to SNF which PT rec yest after repeat eval    Objective:  Vital signs in last 24 hours:       Vitals:   10/25/17 1130 10/25/17 1425 10/25/17 2036 10/26/17 0541  BP: (!) 155/44 (!) 131/48 (!) 118/41 (!) 138/46  Pulse: 60 64 61 60  Resp:  16 16 16   Temp:  98.5 F (36.9 C) 99 F (37.2 C) 98.8 F (37.1 C)  TempSrc:  Oral Oral Oral  SpO2: 98% 98% 99% 98%  Weight:      Height:       Physical Exam   General: Vital signs reviewed. Patient in no acute distress Cardiovascular: regular rate, rhythm, 2-3/6 systolic murmur Pulmonary/Chest: Clear to auscultation bilaterally, no wheezes, rales, or rhonchi. Abdominal: Soft, non-tender, non-distended, BS + Extremities: trace pitting edema     Assessment/Plan:  Small bowel obstruction caused by incarcerated umbilical hernia s/p umbilical hernia repair on 10/21/2017 KUB 10/25/2017 did not show any presence of ileus or obstruction.  Patient needs extended duration of laxatives. Surgery   -Followed by surgery who recommended continue a bowel regimen with  Colace, MiraLAX as needed, and Mineral oil enema as needed -continued ambulation recommended  -discussed with social work today- they said that while PT rec SNF, he has been walking 200 feet , so she is unsure if he would qualify- but she will try to find placement tomorrow   Hypertension- normotensive over last 24 hours  -Continue metoprolol 50 mg twice daily -Continue hydralazine 10 mg every 8 hours -Continue diltiazem 360 mg daily -Continue digoxin 0.25 mg   Urinary Retention: The patient was placed on a coude catheter on 10/21/17 with difficulty.  -Continue oncoudecatheter -continue doxazosin 2mg  qd -Continueoxybutynin 5mg  tid -Discharge home with Foley catheter per urology recommendation and follow-up in 1-2 weeks for  removal and trial of void.    Dispo: Anticipated discharge pending SNF placement  Signed Deneise LeverParth Laasia Arcos MD MPH IMTS

## 2017-10-27 NOTE — Final Consult Note (Signed)
[facility] Progress Note  6 Days Post-Op  Subjective: CC: constipation Patient states he is feeling constipated again. Had 3 BM yesterday, passing flatus. Denies abdominal pain, bloating, n/v. Tolerating diet. Patient feels like his constipation is related to all the medications that he is taking.  Discussed importance of bowel regimen, hydration and diet. UOP good. VSS.   Objective: Vital signs in last 24 hours: Temp:  [98.3 F (36.8 C)-99 F (37.2 C)] 99 F (37.2 C) (11/18 0504) Pulse Rate:  [56-66] 56 (11/18 0504) Resp:  [17-18] 18 (11/18 0504) BP: (124-132)/(48-61) 126/48 (11/18 0504) SpO2:  [94 %-100 %] 94 % (11/18 0504) Last BM Date: 10/26/17  Intake/Output from previous day: 11/17 0701 - 11/18 0700 In: 1015 [P.O.:1015] Out: 3250 [Urine:3250] Intake/Output this shift: No intake/output data recorded.  PE: Gen: Alert, NAD, pleasant Card: Regular rate and rhythm, pedal pulses 2+ BL Pulm: Normal effort, clear to auscultation bilaterally Abd: Soft, non-tender, non-distended, bowel sounds present, no HSM, incisions C/D/I Skin: warm and dry, no rashes  Psych: A&Ox3   Lab Results:  No results for input(s): WBC, HGB, HCT, PLT in the last 72 hours. BMET No results for input(s): NA, K, CL, CO2, GLUCOSE, BUN, CREATININE, CALCIUM in the last 72 hours. PT/INR No results for input(s): LABPROT, INR in the last 72 hours. CMP     Component Value Date/Time   NA 138 10/21/2017 0527   K 3.8 10/21/2017 0527   CL 105 10/21/2017 0527   CO2 27 10/21/2017 0527   GLUCOSE 102 (H) 10/21/2017 0527   BUN 5 (L) 10/21/2017 0527   CREATININE 0.91 10/21/2017 0527   CALCIUM 8.5 (L) 10/21/2017 0527   PROT 5.1 (L) 10/20/2017 0358   ALBUMIN 2.9 (L) 10/20/2017 0358   AST 24 10/20/2017 0358   ALT 20 10/20/2017 0358   ALKPHOS 72 10/20/2017 0358   BILITOT 1.0 10/20/2017 0358   GFRNONAA >60 10/21/2017 0527   GFRAA >60 10/21/2017 0527   Lipase     Component Value Date/Time    LIPASE 21 10/17/2017 1443       Studies/Results: Dg Abd 1 View  Result Date: 10/25/2017 CLINICAL DATA:  66 year old male with high-grade small bowel obstruction related to umbilical hernia on recent CT Abdomen and Pelvis. Status post umbilical hernia repair 10/21/2017. EXAM: ABDOMEN - 1 VIEW COMPARISON:  CT Abdomen and Pelvis 10/17/2017. FINDINGS: Supine view. Improved bowel gas pattern since 10/17/2017, including more large bowel gas (splenic flexure and rectum). Upper limits of normal to mildly dilated gas-filled small bowel still in the left abdomen. Elsewhere abdominal visceral contours are paired are stable. No acute osseous abnormality identified. Vascular calcifications in the pelvis. IMPRESSION: Improved bowel gas pattern since 10/17/2017. Residual upper limits of normal to mildly small bowel in the left abdomen might reflect ileus or less likely residual partial obstruction. Electronically Signed   By: Odessa FlemingH  Hall M.D.   On: 10/25/2017 13:56    Anti-infectives: Anti-infectives (From admission, onward)   Start     Dose/Rate Route Frequency Ordered Stop   10/21/17 0800  ceFAZolin (ANCEF) IVPB 2g/100 mL premix     2 g 200 mL/hr over 30 Minutes Intravenous To Short Stay 10/20/17 2314 10/21/17 0957   10/17/17 1630  piperacillin-tazobactam (ZOSYN) IVPB 3.375 g     3.375 g 100 mL/hr over 30 Minutes Intravenous  Once 10/17/17 1621 10/17/17 1716       Assessment/Plan Umbilical hernia with incarcerated omentum - s/pUmbilical hernia repair, 10/21/17 Dr. 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 Consider cardiology consult if SVT persists. Currently NSR Urinary retention - foley in place CAD/Hx ofMI/history of PSVT - holdingPlavix last dose 10/16/17 Aortic and mitral regurgitation./ EF 60-65% 03/2017 Echo Carotid stenosis and occlusion with hx of CVA PVOD - s/p Left Fem-pop BPG Type II diabetes Hypertension Probable malnutrition and deconditioning Hx of tobacco use  FEN:IVF,Regular diet ID: 1 dose of Zosyn11/8then DC'ed. Ancef 10/21/17 x 1 dose WUJ:WJXBJYNVT:Heparin Foley:placed for urinary retention Follow up: [facility] office 11/27   Plan:Continue bowel regimen at home, ensure proper hydration. We will sign off at this time, please call if questions or concerns arise. Patient will follow up for post-op in our office.    LOS: 9 days    Wells GuilesKelly Rayburn , [facility] 10/27/2017, 7:48 AM Pager: [phone] Consults: [phone] Mon-Fri 7:00 am-4:30 pm Sat-Sun 7:00 am-11:30 am

## 2017-10-28 DIAGNOSIS — L8990 Pressure ulcer of unspecified site, unspecified stage: Secondary | ICD-10-CM

## 2017-10-28 LAB — GLUCOSE, CAPILLARY: Glucose-Capillary: 78 mg/dL (ref 65–99)

## 2017-10-28 NOTE — Progress Notes (Signed)
  While I was explaining to patient and brother that per MD patient is discharge and that he would be considered a private pay if not discharge today unless he make an appeal to stay. Daughter showed up   with a bad attitude saying "So lets go.they've been calling me all day about you". This Clinical research associatewriter then explained to the daughter that CM and SW has been calling her to verify placement and daughter got mad saying "i've been communicating with everyone. Not happy about care. I explained to the daughter that patient is also refusing to go home with foley catheter and that multiple explanation was already given to the patient the importance of the catheter  but the daughter answer this writer back saying" He is old enough(referring to the patient ) to decide what to do. Vahini, Md on call stated that if he decide to take pull foley out then pull it out. Foley then pulled out per patient request. Daughter then asked about the wound in patient back. Explained to the patient that I changed the dressing and gave then couple of dressing to  use. I also explained to the patient that he needs to lay on his side to help the wound to heal. Daughter also asked  the home health need. I told her that CM was trying to work on it today but because they cant keep in touch with her they do not know what she really wants. This Clinical research associatewriter assured the daughter that we will follow up in AM with the case manager. Addressed of the patient was on file and verified with the daughter. DON made aware of the incident with this patient.

## 2017-10-28 NOTE — Care Management Note (Signed)
Case Management Note  Patient Details  Name: Erin FullingWillie J Mccarter MRN: [id number] Date of Birth: 09/02/1951  Subjective/Objective:                    Action/Plan:  Patient has been discharged. Reviewed Important message from Medicare with patient and daughter over phone. D[facility]   Reviewed [id number] with patient and daughter . Patient refused to sign. Read letter to daughter [name] over phone. [name] aware of discharge today.  Expected Discharge Date:  10/28/17               Expected Discharge Plan:  Home w Home Health Services  In-House Referral:     Discharge planning Services  CM Consult  Post Acute Care Choice:  Home Health Choice offered to:  Patient, Adult Children  DME Arranged:    DME Agency:     HH Arranged:  RN, Nurse's Aide, Social Work [employer] Agency:  [facility]  Status of Service:  Completed, signed off  If discussed at [employer] of [employer] Meetings, dates discussed:    Additional Comments:  Kingsley PlanWile, Ashlin Hidalgo Marie, RN 10/28/2017, 3:24 PM

## 2017-10-28 NOTE — Progress Notes (Signed)
MD made aware of the issue with the discharge. ADON aware as well. Patient refusing to go home at this time. Received  A call from SW that they will deal with this tomorrow.

## 2017-10-28 NOTE — Progress Notes (Signed)
Md in talked with the patient, patient continue to refused to be discharged with foley. Per MD patient to stay tonight.

## 2017-10-28 NOTE — Progress Notes (Signed)
   Subjective: [name] was seen sitting in his chair this morning. He stated that he last had a bowel movement 2 days ago. He is in no acute pain. No acute events overnight.   Objective:  Vital signs in last 24 hours: Vitals:   10/27/17 2131 10/27/17 2139 10/28/17 0532 10/28/17 0945  BP:  (!) 131/43 (!) 112/43   Pulse: (!) 59 63 (!) 57 62  Resp:  17    Temp:  98.3 F (36.8 C) 98.6 F (37 C)   TempSrc:  Oral Oral   SpO2:  97% 95%   Weight:      Height:       Physical Exam  Constitutional: He appears well-developed and well-nourished. No distress.  HENT:  Head: Normocephalic and atraumatic.  Eyes: Conjunctivae are normal.  Cardiovascular: Normal rate, regular rhythm and normal heart sounds.  Pulmonary/Chest: Effort normal and breath sounds normal. No stridor. No respiratory distress.  Abdominal: Soft. Bowel sounds are normal. He exhibits no distension. There is no tenderness.  Musculoskeletal: He exhibits no edema.  Neurological: He is alert.  Skin: He is not diaphoretic.  Psychiatric: He has a normal mood and affect. His behavior is normal. Judgment and thought content normal.   Assessment/Plan:  Small bowel obstruction 2/2 incarcerated umbilical hernia  Umbilical hernia repair on 10/21/2017, KUB 10/25/2017 no evidence of ileus or obstruction.   -senna bid, miralax bid, colace 100mg  bid -consider mineral oil enema today if patient does not defecate -ambulate daily  Supraventricular tachycardia Couplet PVCs noted overnight  -continue metoprolol 50 mg bid -continue hydralazine 10 mg q8 hrs -continue diltiazem 360 XT qd -Continue digoxin 0.25 mg qd  Hypertension- normotensive over last 24 hours  -Continue metoprolol 50 mg twice daily -Continue hydralazine 10 mg every 8 hours -Continue diltiazem 360 mg daily -Continue digoxin 0.25 mg  Urinary Retention: The patient was placed on a coude catheter on 10/21/17 with difficulty. Per urology recommendation cath  needs to stay in place for 1 more week and they will remove in their clinic.  -Continue oncoudecatheter -continue doxazosin 2mg  qd -Continueoxybutynin 5mg  tid -Discharge home with Foley catheter  Dispo: Anticipated discharge in approximately 0-1 day. Patient is pending placement to SNF. Social work assistance appreciated.   Lorenso CourierVahini Auriah Hollings, MD Internal Medicine PGY1 Pager:[phone] 10/28/2017, 10:21 AM

## 2017-10-28 NOTE — Progress Notes (Signed)
Brother in to pick up patient for discharge. Patient refusing to go home with foley. MD made aware. Md to come and talk with the patient.

## 2017-10-28 NOTE — Progress Notes (Signed)
CSW contacted by MD that patient continues to request SNF placement, and wondering what is happening in terms of obtaining placement for the patient. CSW provided update that the patient is no longer appropriate and SNF would not be covered by Medicare at this time. Per PT notes, patient is walking 200-350 feet with min guard and back to his baseline functioning at this time; Medicare will likely not pay for any rehabilitation for the patient. CSW met with patient to provide information; patient became agitated during discussion and told CSW to call his daughter and discuss. CSW called patient's daughter and left a voicemail.  CSW will continue to follow.   Laveda Abbe, [place] Clinical Social Worker [phone]

## 2017-10-28 NOTE — Care Management Important Message (Signed)
Important Message  Patient Details  Name: Nicholas [name] MRN: [id number] Date of Birth: 05/05/1951   Medicare Important Message Given:  Yes  Read to patient at bedside, read to patient's daughter over phone [facility] [name]  Kingsley PlanWile, Lelynd Poer Marie, RN 10/28/2017, 3:28 PM

## 2017-10-28 NOTE — Progress Notes (Signed)
Paged several times about patient refusing to leave with foley in place. Went to bedside to discuss with the patient. He is worried that his foley will become infected if he returns to his living establishment and would like it removed. We discussed that removing the foley places him at risk for urinary retention and possible rupture. He acknowledges the risks and does not want the catheter removed but does not want to return home with it in. Discussed with the patient and his brother Susann Givens([place]) that the day team has been working hard to find an alternative solution and unfortunately our only option is to discharge patient with home health. He again refuses to leave with the foley in place and would like better alternatives. That case was discussed with the day team and the discharge order will remain in place.

## 2017-10-28 NOTE — Progress Notes (Addendum)
The patient is medically stable for discharge and refuses to go home. He states that his house is not clean and has spiders and bugs in it and that he is worried about his catheter getting infected. The patient expressed that he wants the catheter out. On call IMTS resident went to bedside and explained the risks of taking cathter out and reinforced that he will be discharged.   I have offered to setup home health and nursing support for the patient at home. The patient is able to ambulate 200-350 feet with minimal guard and is back to his baseline functioning so he is medically stable for discharge. Dr. [name] (attending) was also made aware of the situation and agrees that discharge home is appropriate and if patient refuses then he will have to repeal discharge.   There is documented medicare message given to patient and read to patient's daughter over phone by Ms. Ronny FlurryHeather Wile, Case Management. The patient was notified about how to repeal his discharge if he chooses. Documentation about this is below   Patient has been discharged. Reviewed Important message from Medicare with patient and daughter over phone. [facility]   Reviewed [id number] with patient and daughter . Patient refused to sign. Read letter to daughter [name] over phone. [name] aware of discharge today.  Expected Discharge Date:  10/28/17                     Expected Discharge Plan:  Home w Home Health Services  In-House Referral:     Discharge planning Services  CM Consult  Post Acute Care Choice:  Home Health Choice offered to:  Patient, Adult Children  DME Arranged:    DME Agency:     HH Arranged:  RN, Nurse's Aide, Social Work [employer] Agency:  [facility]  Status of Service:  Completed, signed off    Nicholas CourierVahini Tasheem Elms, MD Internal Medicine PGY1 Pager:[phone] 10/28/2017, 7:07 PM

## 2018-02-17 ENCOUNTER — Inpatient Hospital Stay (HOSPITAL_COMMUNITY): Payer: Medicare Other

## 2018-02-17 ENCOUNTER — Emergency Department (HOSPITAL_COMMUNITY): Payer: Medicare Other

## 2018-02-17 ENCOUNTER — Encounter (HOSPITAL_COMMUNITY): Payer: Self-pay | Admitting: Emergency Medicine

## 2018-02-17 ENCOUNTER — Inpatient Hospital Stay (HOSPITAL_COMMUNITY)
Admission: EM | Admit: 2018-02-17 | Discharge: 2018-02-20 | DRG: 065 | Disposition: A | Discharge status: Rehabilitation Facility | Payer: Medicare Other | Attending: Internal Medicine | Admitting: Internal Medicine

## 2018-02-17 DIAGNOSIS — Z9181 History of falling: Secondary | ICD-10-CM

## 2018-02-17 DIAGNOSIS — I63532 Cerebral infarction due to unspecified occlusion or stenosis of left posterior cerebral artery: Secondary | ICD-10-CM | POA: Diagnosis present

## 2018-02-17 DIAGNOSIS — E7800 Pure hypercholesterolemia, unspecified: Secondary | ICD-10-CM | POA: Diagnosis not present

## 2018-02-17 DIAGNOSIS — R29707 NIHSS score 7: Secondary | ICD-10-CM | POA: Diagnosis present

## 2018-02-17 DIAGNOSIS — Z955 Presence of coronary angioplasty implant and graft: Secondary | ICD-10-CM

## 2018-02-17 DIAGNOSIS — N401 Enlarged prostate with lower urinary tract symptoms: Secondary | ICD-10-CM | POA: Diagnosis present

## 2018-02-17 DIAGNOSIS — E119 Type 2 diabetes mellitus without complications: Secondary | ICD-10-CM | POA: Diagnosis not present

## 2018-02-17 DIAGNOSIS — I2510 Atherosclerotic heart disease of native coronary artery without angina pectoris: Secondary | ICD-10-CM | POA: Diagnosis present

## 2018-02-17 DIAGNOSIS — R7989 Other specified abnormal findings of blood chemistry: Secondary | ICD-10-CM | POA: Diagnosis present

## 2018-02-17 DIAGNOSIS — R413 Other amnesia: Secondary | ICD-10-CM | POA: Diagnosis present

## 2018-02-17 DIAGNOSIS — R338 Other retention of urine: Secondary | ICD-10-CM | POA: Diagnosis present

## 2018-02-17 DIAGNOSIS — I350 Nonrheumatic aortic (valve) stenosis: Secondary | ICD-10-CM

## 2018-02-17 DIAGNOSIS — R32 Unspecified urinary incontinence: Secondary | ICD-10-CM | POA: Diagnosis not present

## 2018-02-17 DIAGNOSIS — I10 Essential (primary) hypertension: Secondary | ICD-10-CM | POA: Diagnosis present

## 2018-02-17 DIAGNOSIS — R748 Abnormal levels of other serum enzymes: Secondary | ICD-10-CM | POA: Diagnosis not present

## 2018-02-17 DIAGNOSIS — Q231 Congenital insufficiency of aortic valve: Secondary | ICD-10-CM

## 2018-02-17 DIAGNOSIS — I480 Paroxysmal atrial fibrillation: Secondary | ICD-10-CM | POA: Diagnosis present

## 2018-02-17 DIAGNOSIS — Z95828 Presence of other vascular implants and grafts: Secondary | ICD-10-CM | POA: Diagnosis not present

## 2018-02-17 DIAGNOSIS — I471 Supraventricular tachycardia, unspecified: Secondary | ICD-10-CM

## 2018-02-17 DIAGNOSIS — H53461 Homonymous bilateral field defects, right side: Secondary | ICD-10-CM | POA: Diagnosis present

## 2018-02-17 DIAGNOSIS — E1151 Type 2 diabetes mellitus with diabetic peripheral angiopathy without gangrene: Secondary | ICD-10-CM | POA: Diagnosis present

## 2018-02-17 DIAGNOSIS — I6622 Occlusion and stenosis of left posterior cerebral artery: Secondary | ICD-10-CM | POA: Diagnosis not present

## 2018-02-17 DIAGNOSIS — Z66 Do not resuscitate: Secondary | ICD-10-CM | POA: Diagnosis present

## 2018-02-17 DIAGNOSIS — I6522 Occlusion and stenosis of left carotid artery: Secondary | ICD-10-CM

## 2018-02-17 DIAGNOSIS — I639 Cerebral infarction, unspecified: Secondary | ICD-10-CM | POA: Diagnosis not present

## 2018-02-17 DIAGNOSIS — I6521 Occlusion and stenosis of right carotid artery: Secondary | ICD-10-CM | POA: Diagnosis not present

## 2018-02-17 DIAGNOSIS — I351 Nonrheumatic aortic (valve) insufficiency: Secondary | ICD-10-CM | POA: Diagnosis not present

## 2018-02-17 DIAGNOSIS — Z79899 Other long term (current) drug therapy: Secondary | ICD-10-CM

## 2018-02-17 DIAGNOSIS — F329 Major depressive disorder, single episode, unspecified: Secondary | ICD-10-CM | POA: Diagnosis not present

## 2018-02-17 DIAGNOSIS — R339 Retention of urine, unspecified: Secondary | ICD-10-CM

## 2018-02-17 DIAGNOSIS — D62 Acute posthemorrhagic anemia: Secondary | ICD-10-CM

## 2018-02-17 DIAGNOSIS — F17200 Nicotine dependence, unspecified, uncomplicated: Secondary | ICD-10-CM | POA: Diagnosis not present

## 2018-02-17 DIAGNOSIS — I69351 Hemiplegia and hemiparesis following cerebral infarction affecting right dominant side: Secondary | ICD-10-CM | POA: Diagnosis not present

## 2018-02-17 DIAGNOSIS — I5189 Other ill-defined heart diseases: Secondary | ICD-10-CM

## 2018-02-17 DIAGNOSIS — E785 Hyperlipidemia, unspecified: Secondary | ICD-10-CM | POA: Diagnosis present

## 2018-02-17 DIAGNOSIS — I6340 Cerebral infarction due to embolism of unspecified cerebral artery: Secondary | ICD-10-CM | POA: Diagnosis not present

## 2018-02-17 DIAGNOSIS — I252 Old myocardial infarction: Secondary | ICD-10-CM

## 2018-02-17 DIAGNOSIS — Z7902 Long term (current) use of antithrombotics/antiplatelets: Secondary | ICD-10-CM | POA: Diagnosis not present

## 2018-02-17 DIAGNOSIS — Z87820 Personal history of traumatic brain injury: Secondary | ICD-10-CM

## 2018-02-17 DIAGNOSIS — H538 Other visual disturbances: Secondary | ICD-10-CM | POA: Diagnosis present

## 2018-02-17 DIAGNOSIS — R778 Other specified abnormalities of plasma proteins: Secondary | ICD-10-CM | POA: Diagnosis present

## 2018-02-17 DIAGNOSIS — I69398 Other sequelae of cerebral infarction: Secondary | ICD-10-CM | POA: Diagnosis not present

## 2018-02-17 DIAGNOSIS — I739 Peripheral vascular disease, unspecified: Secondary | ICD-10-CM

## 2018-02-17 DIAGNOSIS — I484 Atypical atrial flutter: Secondary | ICD-10-CM | POA: Diagnosis present

## 2018-02-17 DIAGNOSIS — R4701 Aphasia: Secondary | ICD-10-CM | POA: Diagnosis present

## 2018-02-17 DIAGNOSIS — I519 Heart disease, unspecified: Secondary | ICD-10-CM | POA: Diagnosis not present

## 2018-02-17 DIAGNOSIS — M21372 Foot drop, left foot: Secondary | ICD-10-CM | POA: Diagnosis not present

## 2018-02-17 DIAGNOSIS — R269 Unspecified abnormalities of gait and mobility: Secondary | ICD-10-CM | POA: Diagnosis not present

## 2018-02-17 LAB — COMPREHENSIVE METABOLIC PANEL
ALK PHOS: 81 U/L (ref 38–126)
ALT: 49 U/L (ref 17–63)
AST: 45 U/L — ABNORMAL HIGH (ref 15–41)
Albumin: 3.9 g/dL (ref 3.5–5.0)
Anion gap: 10 (ref 5–15)
BILIRUBIN TOTAL: 1.8 mg/dL — AB (ref 0.3–1.2)
BUN: 13 mg/dL (ref 6–20)
CALCIUM: 9.5 mg/dL (ref 8.9–10.3)
CO2: 26 mmol/L (ref 22–32)
Chloride: 104 mmol/L (ref 101–111)
Creatinine, Ser: 1.03 mg/dL (ref 0.61–1.24)
Glucose, Bld: 86 mg/dL (ref 65–99)
POTASSIUM: 5.2 mmol/L — AB (ref 3.5–5.1)
Sodium: 140 mmol/L (ref 135–145)
TOTAL PROTEIN: 6.3 g/dL — AB (ref 6.5–8.1)

## 2018-02-17 LAB — URINALYSIS, ROUTINE W REFLEX MICROSCOPIC
BILIRUBIN URINE: NEGATIVE
Glucose, UA: NEGATIVE mg/dL
HGB URINE DIPSTICK: NEGATIVE
Ketones, ur: NEGATIVE mg/dL
Leukocytes, UA: NEGATIVE
NITRITE: NEGATIVE
PROTEIN: NEGATIVE mg/dL
SPECIFIC GRAVITY, URINE: 1.014 (ref 1.005–1.030)
pH: 8 (ref 5.0–8.0)

## 2018-02-17 LAB — RAPID URINE DRUG SCREEN, HOSP PERFORMED
Amphetamines: NOT DETECTED
BARBITURATES: NOT DETECTED
BENZODIAZEPINES: NOT DETECTED
Cocaine: NOT DETECTED
OPIATES: NOT DETECTED
Tetrahydrocannabinol: NOT DETECTED

## 2018-02-17 LAB — CBG MONITORING, ED: Glucose-Capillary: 88 mg/dL (ref 65–99)

## 2018-02-17 LAB — CBC
HEMATOCRIT: 36.4 % — AB (ref 39.0–52.0)
HEMOGLOBIN: 12.4 g/dL — AB (ref 13.0–17.0)
MCH: 34.5 pg — AB (ref 26.0–34.0)
MCHC: 34.1 g/dL (ref 30.0–36.0)
MCV: 101.4 fL — ABNORMAL HIGH (ref 78.0–100.0)
Platelets: 285 10*3/uL (ref 150–400)
RBC: 3.59 MIL/uL — ABNORMAL LOW (ref 4.22–5.81)
RDW: 12.5 % (ref 11.5–15.5)
WBC: 7.5 10*3/uL (ref 4.0–10.5)

## 2018-02-17 LAB — ETHANOL

## 2018-02-17 MED ORDER — IOPAMIDOL (ISOVUE-370) INJECTION 76%
INTRAVENOUS | Status: AC
  Administered 2018-02-17: 50 mL
  Filled 2018-02-17: qty 50

## 2018-02-17 NOTE — ED Triage Notes (Signed)
Per [facility] patient coming from home after patient's  brother called ems sating that he couldn't see and wasn't able to remember anything that happened today. Patient alert to self and place. Disoriented to time and situation. Neg stroke screen with ems. Patient reports previous stroke with no deficients. Ekg shows intermittent bigeminy. 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

## 2018-02-17 NOTE — ED Provider Notes (Signed)
[facility] EMERGENCY DEPARTMENT Provider Note   CSN: [id number] Arrival date & time: 02/17/18  1545     History   Chief Complaint Chief Complaint  Patient presents with  . Altered Mental Status   Level 5 caveat: Altered mental status  HPI ORLAND VISCONTI is a 67 y.o. male.  HPI Patient is a 67 year old male presents the emergency department with reported confusion and inability to remember the events of the day.  He does have a history of stroke.  He states he feels fine at this time but cannot remember any events today.  He does not know the date or the president.  He does not know the season.  He denies chest pain.  Denies neck pain.  Denies headache.  No abdominal pain.  Past Medical History:  Diagnosis Date  . At high risk for falls   . BPH (benign prostatic hyperplasia)   . BPH (benign prostatic hypertrophy)    HX RETENTION  . Carotid artery occlusion    S/P RIGHT CEA  . Coronary artery disease CARDIOLOGIST--  DR Johney Frame   S/P  PCI TO LAD 1993  . GERD (gastroesophageal reflux disease)   . History of CVA (cerebrovascular accident)    1993---  NO RESIDUALS  . History of head injury    CONCUSSION--  NO RESIDUAL  . History of myocardial infarction    1993--  NON-Q WAVE  S/P PCI TO LAD  . Hydrocele, left   . Hyperlipidemia   . Hypertension   . Left carotid artery stenosis    MILD  . Myocardial infarction (HCC)   . PSVT (paroxysmal supraventricular tachycardia) (HCC)   . PVD (peripheral vascular disease) (HCC)    S/P LEFT FEM-POP  . Right leg weakness    USES CANE--  SECONDARY TO PVD  . SBO (small bowel obstruction) (HCC)   . Type 2 diabetes mellitus (HCC)   . Wears glasses     Patient Active Problem List   Diagnosis Date Noted  . Pressure injury of skin 10/28/2017  . SBO (small bowel obstruction) (HCC) 10/17/2017  . Bacterial UTI 03/19/2017  . Adjustment disorder   . Slow transit constipation   . Weakness of both lower extremities     . Bilateral foot-drop   . Lumbar radiculopathy 03/13/2017  . Lumbar spinal stenosis 03/13/2017  . Foot drop, right   . Urinary retention due to benign prostatic hyperplasia   . Benign essential HTN   . History of CVA (cerebrovascular accident) 03/11/2017  . Cognitive deficits, late effect of cerebrovascular disease 07/23/2014  . 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 Aortic valve disorder 08/09/2010  . CLAUDICATION 08/09/2010  . Hyperlipidemia 08/08/2010  . TOBACCO ABUSE 08/08/2010  . Essential hypertension 08/08/2010  . CAD, NATIVE VESSEL 08/08/2010  . Occlusion and stenosis of carotid artery 08/08/2010    Past Surgical History:  Procedure Laterality Date  . CARDIAC CATHETERIZATION  01-18-2003   DR Eden Emms   NON-OBSTRUCTIVE CAD/  PREVIOIS ANGIOPLASTY SITE WIDELY PATENT  . CARDIOVASCULAR STRESS TEST  2007   SMALL ANTERO-APICAL SCAR/  NO ISCHEMIA  . CAROTID ENDARTERECTOMY Right 05-31-2003  . CORONARY ANGIOPLASTY  1993   PCI TO LAD  . FEMORAL-POPLITEAL BYPASS GRAFT Left 01-19-2003  . HYDROCELE EXCISION Left 10/27/2013   Procedure: HYDROCELECTOMY ADULT;  Surgeon: Lindaann Slough, MD;  Location: [facility];  Service: Urology;  Laterality: Left;  . RIGHT HYDROCELECTOMY  05-31-2003  . TRANSTHORACIC ECHOCARDIOGRAM  08-24-2010  EF 55%/  MILD AORTIC INSUFFICENCY/  MODERATE LVH  . UMBILICAL HERNIA REPAIR N/A 10/21/2017   Procedure: UMBILICAL HERNIA REPAIR;  Surgeon: Manus Rudd, MD;  Location: [facility] OR;  Service: General;  Laterality: N/A;       Home Medications    Prior to Admission medications   Medication Sig Start Date End Date Taking? Authorizing Provider  digoxin (LANOXIN) 0.25 MG tablet Take 1 tablet (0.25 mg total) by mouth daily. 03/21/17  Yes Love, Evlyn Kanner, PA-C  diltiazem (TAZTIA XT) 360 MG 24 hr capsule Take 1 capsule (360 mg total) by mouth daily. 03/21/17  Yes Love, Evlyn Kanner, PA-C  doxazosin (CARDURA) 2 MG tablet Take 1 tablet  (2 mg total) by mouth daily. 03/21/17  Yes Love, Evlyn Kanner, PA-C  hydrALAZINE (APRESOLINE) 10 MG tablet Take 1 tablet (10 mg total) by mouth every 8 (eight) hours. Patient taking differently: Take 10 mg by mouth at bedtime.  03/21/17  Yes Love, Evlyn Kanner, PA-C  metoprolol (LOPRESSOR) 50 MG tablet Take 1 tablet (50 mg total) by mouth 2 (two) times daily. 03/21/17  Yes Love, Evlyn Kanner, PA-C  rosuvastatin (CRESTOR) 10 MG tablet Take 1 tablet (10 mg total) by mouth daily. 03/21/17  Yes Love, Evlyn Kanner, PA-C  clopidogrel (PLAVIX) 75 MG tablet Take 1 tablet (75 mg total) by mouth daily with breakfast. Patient not taking: Reported on 02/17/2018 03/21/17   Love, Evlyn Kanner, PA-C  mineral oil enema Place 133 mLs (1 enema total) as needed rectally for severe constipation. Patient not taking: Reported on 02/17/2018 10/25/17   Deneise Lever, MD  polyethylene glycol [facility] / Ethelene Hal) packet Take 17 g daily as needed by mouth. Patient not taking: Reported on 02/17/2018 10/25/17   Deneise Lever, MD    Family History Family History  Problem Relation Age of Onset  . Stroke Mother   . Hypertension Mother   . Hypertension Father     Social History Social History   Tobacco Use  . Smoking status: Former Smoker    Years: 0.00    Types: Cigarettes  . Smokeless tobacco: Never Used  . Tobacco comment: very seldom   Substance Use Topics  . Alcohol use: No  . Drug use: No     Allergies   Patient has no known allergies.   Review of Systems Review of Systems  Unable to perform ROS: Mental status change     Physical Exam Updated Vital Signs BP (!) 154/58   Pulse 67   Temp 98.6 F (37 C) (Rectal)   Resp 16   SpO2 99%   Physical Exam  HENT:  Head: Normocephalic and atraumatic.  Eyes: EOM are normal.  Neck: Normal range of motion.  Cardiovascular: Normal rate, regular rhythm and intact distal pulses.  Pulmonary/Chest: Effort normal and breath sounds normal. No respiratory distress.  Abdominal: He  exhibits no distension. There is no tenderness.  Musculoskeletal: Normal range of motion.  Neurological: He is alert.  Moves all extremities.  Follows simple commands.  Unable to remember recent events.  No facial droop.  Skin: Skin is warm and dry.  Nursing note and vitals reviewed.    ED Treatments / Results  Labs (all labs ordered are listed, but only abnormal results are displayed) Labs Reviewed  CBC - Abnormal; Notable for the following components:      Result Value   RBC 3.59 (*)    Hemoglobin 12.4 (*)    HCT 36.4 (*)    MCV 101.4 (*)  MCH 34.5 (*)    All other components within normal limits  COMPREHENSIVE METABOLIC PANEL - Abnormal; Notable for the following components:   Potassium 5.2 (*)    Total Protein 6.3 (*)    AST 45 (*)    Total Bilirubin 1.8 (*)    All other components within normal limits  ETHANOL  RAPID URINE DRUG SCREEN, HOSP PERFORMED  CBG MONITORING, ED    EKG  EKG Interpretation  Date/Time:  Monday February 17 2018 15:54:29 EDT Ventricular Rate:  67 PR Interval:    QRS Duration: 82 QT Interval:  440 QTC Calculation: 465 R Axis:   62 Text Interpretation:  Sinus rhythm Left atrial enlargement Anteroseptal infarct, old No significant change was found Confirmed by Azalia Bilisampos, Josanne Boerema ([id number]) on 02/17/2018 8:29:03 PM       Radiology No results found.  Procedures Procedures (including critical care time)  Medications Ordered in ED Medications - No data to display   Initial Impression / Assessment and Plan / ED Course  I have reviewed the triage vital signs and the nursing notes.  Pertinent labs & imaging results that were available during my care of the patient were reviewed by me and considered in my medical decision making (see chart for details).     Patient with acute stroke on MRI.  Patient will need admission in the hospital.  Neurology consultation.  Hospitalist admission.  Stroke swallow screen to be completed.  Final Clinical  Impressions(s) / ED Diagnoses   Final diagnoses:  None    ED Discharge Orders    None       Azalia Bilisampos, Safire Gordin, MD 02/17/18 2030

## 2018-02-17 NOTE — H&P (Signed)
Nicholas [name] ZOX:[id number] DOB: 1951-12-08 DOA: 02/17/2018     PCP: [name], MD   Outpatient Specialists: [facility] Patient coming from:  home Lives alone,      Chief Complaint: Could not see out of 1 eye and had memory problems  HPI: Nicholas [name] is a 67 y.o. male with medical history significant of supraventricular tachycardia, urinary retention, small bowel obstruction DM 2 HTN CAD status post PCI to LAD, TIA PVD  Presented with confusion cannot remember what happened today patient was not fully oriented to the EMS arrived today his brother found him being confused and stated he could not see out of right eye denied any headaches, He has chronic  trouble walking for years. Unsure what time did this start History significant for mother stroke And is concerned of patient having trouble remembering recent events  Has hx of Carotid artery disease sp Endarterectomy he is unsure how long ago. Denies any chest pain. No fever chills.   Reports taking his Plavix he is unsure why he is on this.  Daughter states that he likely has not been taking this. Unable to provide detailed hx.   While in ER: MRI was done confirming CVA Significant initial  Findings: WBC 7.5 Hg 12.4 PLT 285 K5.2 hemolysed Hemoglobin 12.4  Acute large nonhemorrhagic temporal occipital lobe/PCA territory infarct  ER provider discussed case with:  Dr. Jerrell Belfast Who recommends: * We'll see patient in consult in the morning*    IN ER:  Temp (24hrs), Avg:98.5 F (36.9 C), Min:98.3 F (36.8 C), Max:98.6 F (37 C)      on arrival  ED Triage Vitals  Enc Vitals Group     BP 02/17/18 1552 (!) 150/58     Pulse Rate 02/17/18 1552 72     Resp 02/17/18 1552 14     Temp 02/17/18 1603 98.6 F (37 C)     Temp Source 02/17/18 1552 Rectal     SpO2 02/17/18 1552 97 %     Weight --      Height --      Head Circumference --      Peak Flow --      Pain Score 02/17/18 2119 0     Pain Loc --      Pain  Edu? --      Excl. in GC? --     Latest RR 14 sat 96% HR 68 BP 146/58  Following Medications were ordered in ER: Medications - No data to display   Hospitalist was called for admission for CVA  Regarding pertinent Chronic problems: Diabetes mellitus p.o. medications  Review of Systems:    Pertinent positives include: Confusion trouble memory formation loss of vision  Constitutional:  No weight loss, night sweats, Fevers, chills, fatigue, weight loss  HEENT:  No headaches, Difficulty swallowing,Tooth/dental problems,Sore throat,  No sneezing, itching, ear ache, nasal congestion, post nasal drip,  Cardio-vascular:  No chest pain, Orthopnea, PND, anasarca, dizziness, palpitations.no Bilateral lower extremity swelling  GI:  No heartburn, indigestion, abdominal pain, nausea, vomiting, diarrhea, change in bowel habits, loss of appetite, melena, blood in stool, hematemesis Resp:  no shortness of breath at rest. No dyspnea on exertion, No excess mucus, no productive cough, No non-productive cough, No coughing up of blood.No change in color of mucus.No wheezing. Skin:  no rash or lesions. No jaundice GU:  no dysuria, change in color of urine, no urgency or frequency. No straining to urinate.  No flank  pain.  Musculoskeletal:  No joint pain or no joint swelling. No decreased range of motion. No back pain.  Psych:  No change in mood or affect. No depression or anxiety. No memory loss.  Neuro: no localizing neurological complaints, no tingling, no weakness, no double vision, no gait abnormality, no slurred speech,    As per HPI otherwise 10 point review of systems negative.   Past Medical History: Past Medical History:  Diagnosis Date  . At high risk for falls   . BPH (benign prostatic hyperplasia)   . BPH (benign prostatic hypertrophy)    HX RETENTION  . Carotid artery occlusion    S/P RIGHT CEA  . Coronary artery disease CARDIOLOGIST--  DR Johney FrameALLRED   S/P  PCI TO LAD 1993  .  GERD (gastroesophageal reflux disease)   . History of CVA (cerebrovascular accident)    1993---  NO RESIDUALS  . History of head injury    CONCUSSION--  NO RESIDUAL  . History of myocardial infarction    1993--  NON-Q WAVE  S/P PCI TO LAD  . Hydrocele, left   . Hyperlipidemia   . Hypertension   . Left carotid artery stenosis    MILD  . Myocardial infarction (HCC)   . PSVT (paroxysmal supraventricular tachycardia) (HCC)   . PVD (peripheral vascular disease) (HCC)    S/P LEFT FEM-POP  . Right leg weakness    USES CANE--  SECONDARY TO PVD  . SBO (small bowel obstruction) (HCC)   . Type 2 diabetes mellitus (HCC)   . Wears glasses    Past Surgical History:  Procedure Laterality Date  . CARDIAC CATHETERIZATION  01-18-2003   DR Eden EmmsNISHAN   NON-OBSTRUCTIVE CAD/  PREVIOIS ANGIOPLASTY SITE WIDELY PATENT  . CARDIOVASCULAR STRESS TEST  2007   SMALL ANTERO-APICAL SCAR/  NO ISCHEMIA  . CAROTID ENDARTERECTOMY Right 05-31-2003  . CORONARY ANGIOPLASTY  1993   PCI TO LAD  . FEMORAL-POPLITEAL BYPASS GRAFT Left 01-19-2003  . HYDROCELE EXCISION Left 10/27/2013   Procedure: HYDROCELECTOMY ADULT;  Surgeon: Lindaann SloughMarc-Henry Nesi, MD;  [facility];  Service: Urology;  Laterality: Left;  . RIGHT HYDROCELECTOMY  05-31-2003  . TRANSTHORACIC ECHOCARDIOGRAM  08-24-2010   EF 55%/  MILD AORTIC INSUFFICENCY/  MODERATE LVH  . UMBILICAL HERNIA REPAIR N/A 10/21/2017   Procedure: UMBILICAL HERNIA REPAIR;  Surgeon: Manus Ruddsuei, Matthew, MD;  Location: [facility] OR;  Service: General;  Laterality: N/A;     Social History:  Ambulatory  walker       reports that he has quit smoking. His smoking use included cigarettes. He quit after 0.00 years of use. he has never used smokeless tobacco. He reports that he does not drink alcohol or use drugs.  Allergies:  No Known Allergies   Family History:   Family History  Problem Relation Age of Onset  . Stroke Mother   . Hypertension Mother   . Hypertension  Father     Medications: Prior to Admission medications   Medication Sig Start Date End Date Taking? Authorizing Provider  digoxin (LANOXIN) 0.25 MG tablet Take 1 tablet (0.25 mg total) by mouth daily. 03/21/17  Yes Love, Evlyn KannerPamela S, PA-C  diltiazem (TAZTIA XT) 360 MG 24 hr capsule Take 1 capsule (360 mg total) by mouth daily. 03/21/17  Yes Love, Evlyn KannerPamela S, PA-C  doxazosin (CARDURA) 2 MG tablet Take 1 tablet (2 mg total) by mouth daily. 03/21/17  Yes Love, Evlyn KannerPamela S, PA-C  hydrALAZINE (APRESOLINE) 10 MG tablet Take 1  tablet (10 mg total) by mouth every 8 (eight) hours. Patient taking differently: Take 10 mg by mouth at bedtime.  03/21/17  Yes Love, Evlyn Kanner, PA-C  metoprolol (LOPRESSOR) 50 MG tablet Take 1 tablet (50 mg total) by mouth 2 (two) times daily. 03/21/17  Yes Love, Evlyn Kanner, PA-C  rosuvastatin (CRESTOR) 10 MG tablet Take 1 tablet (10 mg total) by mouth daily. 03/21/17  Yes Love, Evlyn Kanner, PA-C  clopidogrel (PLAVIX) 75 MG tablet Take 1 tablet (75 mg total) by mouth daily with breakfast. Patient not taking: Reported on 02/17/2018 03/21/17   Love, Evlyn Kanner, PA-C  mineral oil enema Place 133 mLs (1 enema total) as needed rectally for severe constipation. Patient not taking: Reported on 02/17/2018 10/25/17   Deneise Lever, MD  polyethylene glycol [facility] / Ethelene Hal) packet Take 17 g daily as needed by mouth. Patient not taking: Reported on 02/17/2018 10/25/17   Deneise Lever, MD    Physical Exam: Patient Vitals for the past 24 hrs:  BP Temp Temp src Pulse Resp SpO2  02/17/18 2120 - 98.3 F (36.8 C) - - - -  02/17/18 2117 (!) 167/59 - - 66 14 100 %  02/17/18 1915 (!) 154/58 - - 67 - 99 %  02/17/18 1800 (!) 136/41 - - 66 - 98 %  02/17/18 1745 (!) 150/58 - - 74 16 96 %  02/17/18 1730 (!) 143/43 - - 67 11 97 %  02/17/18 1715 (!) 145/57 - - 65 17 98 %  02/17/18 1700 (!) 146/53 - - 64 11 98 %  02/17/18 1645 (!) 156/53 - - 64 19 96 %  02/17/18 1630 (!) 152/55 - - 66 17 98 %  02/17/18 1603 -  98.6 F (37 C) Rectal - - -  02/17/18 1600 (!) 146/61 - - 66 - 99 %  02/17/18 1555 - - - - - 99 %  02/17/18 1552 (!) 150/58 - Rectal 72 14 97 %    1. General:  in No Acute distress   Chronically ill  -appearing 2. Psychological: Alert and  Oriented to place and situation 3. Head/ENT:     Dry Mucous Membranes                          Head Non traumatic, neck supple                            Poor Dentition 4. SKIN: decreased Skin turgor,  Skin clean Dry and intact no rash 5. Heart: Regular rate and rhythm systolic Murmur, no Rub or gallop 6. Lungs:  Clear to auscultation bilaterally, no wheezes or crackles   7. Abdomen: Soft, suprapubic tenderness tender, Non distended  bowel sounds present 8. Lower extremities: no clubbing, cyanosis, or edema 9. Neurologically  strength 5 out of 5 in all 4 extremities not fully following commands appears to be confabulating right-sided neglect with likely right-sided hemianopsia 10. MSK: Normal range of motion   body mass index is unknown because there is no height or weight on file.  Labs on Admission:   Labs on Admission: I have personally reviewed following labs and imaging studies  CBC: Recent Labs  Lab 02/17/18 1609  WBC 7.5  HGB 12.4*  HCT 36.4*  MCV 101.4*  PLT 285   Basic Metabolic Panel: Recent Labs  Lab 02/17/18 1609  NA 140  K 5.2*  CL 104  CO2 26  GLUCOSE 86  BUN 13  CREATININE 1.03  CALCIUM 9.5   GFR: CrCl cannot be calculated (Unknown ideal weight.). Liver Function Tests: Recent Labs  Lab 02/17/18 1609  AST 45*  ALT 49  ALKPHOS 81  BILITOT 1.8*  PROT 6.3*  ALBUMIN 3.9   No results for input(s): LIPASE, AMYLASE in the last 168 hours. No results for input(s): AMMONIA in the last 168 hours. Coagulation Profile: No results for input(s): INR, PROTIME in the last 168 hours. Cardiac Enzymes: No results for input(s): CKTOTAL, CKMB, CKMBINDEX, TROPONINI in the last 168 hours. BNP (last 3 results) No results  for input(s): PROBNP in the last 8760 hours. HbA1C: No results for input(s): HGBA1C in the last 72 hours. CBG: Recent Labs  Lab 02/17/18 1553  GLUCAP 88   Lipid Profile: No results for input(s): CHOL, HDL, LDLCALC, TRIG, CHOLHDL, LDLDIRECT in the last 72 hours. Thyroid Function Tests: No results for input(s): TSH, T4TOTAL, FREET4, T3FREE, THYROIDAB in the last 72 hours. Anemia Panel: No results for input(s): VITAMINB12, FOLATE, FERRITIN, TIBC, IRON, RETICCTPCT in the last 72 hours. Urine analysis:    Component Value Date/Time   COLORURINE YELLOW 02/17/2018 2110   APPEARANCEUR CLEAR 02/17/2018 2110   LABSPEC 1.014 02/17/2018 2110   PHURINE 8.0 02/17/2018 2110   GLUCOSEU NEGATIVE 02/17/2018 2110   HGBUR NEGATIVE 02/17/2018 2110   BILIRUBINUR NEGATIVE 02/17/2018 2110   KETONESUR NEGATIVE 02/17/2018 2110   PROTEINUR NEGATIVE 02/17/2018 2110   UROBILINOGEN 0.2 10/09/2015 1232   NITRITE NEGATIVE 02/17/2018 2110   LEUKOCYTESUR NEGATIVE 02/17/2018 2110   Sepsis Labs: @LABRCNTIP (procalcitonin:4,lacticidven:4) )No results found for this or any previous visit (from the past 240 hour(s)).     UA    no evidence of UTI     Lab Results  Component Value Date   HGBA1C 5.4 03/12/2017    CrCl cannot be calculated (Unknown ideal weight.).  BNP (last 3 results) No results for input(s): PROBNP in the last 8760 hours.   ECG REPORT  Independently reviewed Rate: 67  Rhythm: sinus rhythm ST&T Change: No acute ischemic changes   QTC 465  There were no vitals filed for this visit.   Cultures:    Component Value Date/Time   SDES URINE, RANDOM 03/18/2017 1157   SPECREQUEST NONE 03/18/2017 1157   CULT >=100,000 COLONIES/mL PROTEUS MIRABILIS (A) 03/18/2017 1157   REPTSTATUS 03/20/2017 FINAL 03/18/2017 1157     Radiological Exams on Admission: Mr Brain Wo Contrast  Result Date: 02/17/2018 CLINICAL DATA:  Amnesia today and vision loss. Suspect stroke. History of hypertension,  diabetes, status post RIGHT carotid endarterectomy. EXAM: MRI HEAD WITHOUT CONTRAST TECHNIQUE: Multiplanar, multiecho pulse sequences of the brain and surrounding structures were obtained without intravenous contrast. COMPARISON:  MRI of the head March 11, 2017 FINDINGS: INTRACRANIAL CONTENTS: Confluent reduced diffusion LEFT mesial temporal occipital lobe with low ADC values. Small sub foci of reduced diffusion bilateral cerebellum and RIGHT occipital lobe. Regional mass effect without midline shift. Punctate focus of reduced diffusion LEFT mesial thalamus, bilateral mesial parietal lobes. No susceptibility artifact to suggest hemorrhage. Mild susceptibility artifact associated with biparietal and bifrontal infarcts. Mild parenchymal brain volume. No abnormal extra-axial loss. No hydrocephalus fluid collections. VASCULAR: Chronically occluded RIGHT internal carotid artery. Remaining major flow voids preserved. SKULL AND UPPER CERVICAL SPINE: No abnormal sellar expansion. No suspicious calvarial bone marrow signal. Craniocervical junction maintained. SINUSES/ORBITS: Moderate paranasal sinus mucosal thickening without air-fluid levels. Mastoid air cells are well aerated.The included ocular globes and orbital contents are  non-suspicious. OTHER: None. IMPRESSION: 1. Acute large nonhemorrhagic temporal occipital lobe/PCA territory infarct. Additional small acute posterior circulation and posterior watershed infarcts. 2. Old bifrontal and biparietal lobe infarcts in ACA and MCA territories. 3. Chronically occluded RIGHT internal carotid artery. 4. Critical Value/emergent results were called by telephone at the time of interpretation on 02/17/2018 at 8:25 pm to Dr. Azalia Bilis , who verbally acknowledged these results. Electronically Signed   By: Awilda Metro M.D.   On: 02/17/2018 20:28    Chart has been reviewed    Assessment/Plan  67 y.o. male with medical history significant of supraventricular  tachycardia, urinary retention, small bowel obstruction DM 2 HTN CAD status post PCI to LAD, TIA PVD Admitted for left posterior circulation CVA  Present on Admission: . CVA (cerebral vascular accident) (HCC) -  - will admit based on  CVA protocol, ordered a CTA angiogram of the head and neck and Echo, obtain cardiac enzymes,  ECG,   Lipid panel, TSH. Order PT/OT evaluation. Will make sure patient is on antiplatelet agent.  Continue Plavix neurology consulted.       . Hyperlipidemia - stable continue statin . Essential hypertension allow permissive hypertension for today PRN labetalol for systolic blood pressure above 161 . CAD, NATIVE VESSEL - continue plavix and statin . TOBACCO ABUSE spoke about the importance of quitting dm 2 -  - Order Sensitive   SSI   -  check TSH and HgA1C  - Hold by mouth medications    History of SVT would restart home medications as soon as able to tolerate to avoid rebound SVT Other plan as per orders.  DVT prophylaxis:  SCD   Code Status:   DNR/DNI as per patient patient has been asked on numerous occasions and being consistent in his answers  Family Communication:   Family not  at  Bedside    Disposition Plan:     likely will need placement for rehabilitation                                            Would benefit from PT/OT eval prior to DC  ordered                                           Consults called: neurosurgery   Admission status:    inpatient       Level of care     tele          I have spent a total of 66 min on this admission extra time was taken to discuss case with neurology Janea Schwenn 02/17/2018, 11:15 PM    [facility]  Pager [phone]   after 2 AM please page floor coverage PA If 7AM-7PM, please contact the day team taking care of the patient  [email or web address]  Password [email or web address]

## 2018-02-17 NOTE — Consult Note (Signed)
Requesting Physician: Dr. Patria Mane    Chief Complaint: Amnesia, Vision loss  History obtained from: Patient and Chart    HPI:                                                                                                                                       Nicholas [name] is an 67 y.o. male past medical history of CVA, right CEA, hypertension, diabetes, hyperlipidemia, SVT, coronary artery disease who was found by his brother confused. Patient is a poor historian and cannot remember anything. History was obtained by chart review. Last seen normal is unclear. On arrival to emergency room MRI brain showed a left PCA stroke. Patient was admitted to hospitalist team and neurology was consulted. Patient takes Plavix at home.  Date last known well: Unclear, possibly 02/16/2018 Time last known well: Unclear  tPA Given: No outside window NIHSS: 7 Baseline MRS 0     Past Medical History:  Diagnosis Date  . At high risk for falls   . BPH (benign prostatic hyperplasia)   . BPH (benign prostatic hypertrophy)    HX RETENTION  . Carotid artery occlusion    S/P RIGHT CEA  . Coronary artery disease CARDIOLOGIST--  DR Johney Frame   S/P  PCI TO LAD 1993  . GERD (gastroesophageal reflux disease)   . History of CVA (cerebrovascular accident)    1993---  NO RESIDUALS  . History of head injury    CONCUSSION--  NO RESIDUAL  . History of myocardial infarction    1993--  NON-Q WAVE  S/P PCI TO LAD  . Hydrocele, left   . Hyperlipidemia   . Hypertension   . Left carotid artery stenosis    MILD  . Myocardial infarction (HCC)   . PSVT (paroxysmal supraventricular tachycardia) (HCC)   . PVD (peripheral vascular disease) (HCC)    S/P LEFT FEM-POP  . Right leg weakness    USES CANE--  SECONDARY TO PVD  . SBO (small bowel obstruction) (HCC)   . Type 2 diabetes mellitus (HCC)   . Wears glasses     Past Surgical History:  Procedure Laterality Date  . CARDIAC CATHETERIZATION  01-18-2003   DR Eden Emms    NON-OBSTRUCTIVE CAD/  PREVIOIS ANGIOPLASTY SITE WIDELY PATENT  . CARDIOVASCULAR STRESS TEST  2007   SMALL ANTERO-APICAL SCAR/  NO ISCHEMIA  . CAROTID ENDARTERECTOMY Right 05-31-2003  . CORONARY ANGIOPLASTY  1993   PCI TO LAD  . FEMORAL-POPLITEAL BYPASS GRAFT Left 01-19-2003  . HYDROCELE EXCISION Left 10/27/2013   Procedure: HYDROCELECTOMY ADULT;  Surgeon: Lindaann Slough, MD;  Location: [facility];  Service: Urology;  Laterality: Left;  . RIGHT HYDROCELECTOMY  05-31-2003  . TRANSTHORACIC ECHOCARDIOGRAM  08-24-2010   EF 55%/  MILD AORTIC INSUFFICENCY/  MODERATE LVH  . UMBILICAL HERNIA REPAIR N/A 10/21/2017   Procedure: UMBILICAL HERNIA REPAIR;  Surgeon: Manus Rudd, MD;  Location: [facility]  OR;  Service: General;  Laterality: N/A;    Family History  Problem Relation Age of Onset  . Stroke Mother   . Hypertension Mother   . Hypertension Father    Social History:  reports that he has quit smoking. His smoking use included cigarettes. He quit after 0.00 years of use. he has never used smokeless tobacco. He reports that he does not drink alcohol or use drugs.  Allergies: No Known Allergies  Medications:                                                                                                                        I reviewed home medications   ROS:                                                                                                                                     14 systems reviewed and negative except above    Examination:                                                                                                      General: Appears well-developed and well-nourished.  Psych: Affect appropriate to situation Eyes: No scleral injection HENT: No OP obstrucion Head: Normocephalic.  Cardiovascular: Normal rate and regular rhythm.  Respiratory: Effort normal and breath sounds normal to anterior ascultation GI: Soft.  No distension. There is no  tenderness.  Skin: WDI   Neurological Examination Mental Status: Alert, not oriented to place or time. Speech fluent, has difficulty naming objects. Able to follow simple commands without difficulty. Cranial Nerves: II: Right homonynous hemianopsia III,IV, VI: ptosis not present, extra-ocular motions intact bilaterally, pupils equal, round, reactive to light and accommodation V,VII: smile symmetric, facial light touch sensation normal bilaterally VIII: hearing normal bilaterally IX,X: uvula rises symmetrically XI: bilateral shoulder shrug XII: midline tongue extension Motor: Right : Upper extremity   5/5    Left:     Upper extremity   5/5  Lower extremity  4/5     Lower extremity   4/5 Tone and bulk:normal tone throughout; no atrophy noted Sensory: Pinprick and light touch intact throughout, bilaterally Deep Tendon Reflexes: 2+ and symmetric throughout Plantars: Right: downgoing   Left: downgoing Cerebellar: normal finger-to-nose, normal rapid alternating movements and normal heel-to-shin test Gait: normal gait and station     Lab Results: Basic Metabolic Panel: Recent Labs  Lab 02/17/18 1609  NA 140  K 5.2*  CL 104  CO2 26  GLUCOSE 86  BUN 13  CREATININE 1.03  CALCIUM 9.5    CBC: Recent Labs  Lab 02/17/18 1609  WBC 7.5  HGB 12.4*  HCT 36.4*  MCV 101.4*  PLT 285    Coagulation Studies: No results for input(s): LABPROT, INR in the last 72 hours.  Imaging: Mr Brain Wo Contrast  Result Date: 02/17/2018 CLINICAL DATA:  Amnesia today and vision loss. Suspect stroke. History of hypertension, diabetes, status post RIGHT carotid endarterectomy. EXAM: MRI HEAD WITHOUT CONTRAST TECHNIQUE: Multiplanar, multiecho pulse sequences of the brain and surrounding structures were obtained without intravenous contrast. COMPARISON:  MRI of the head March 11, 2017 FINDINGS: INTRACRANIAL CONTENTS: Confluent reduced diffusion LEFT mesial temporal occipital lobe with low ADC  values. Small sub foci of reduced diffusion bilateral cerebellum and RIGHT occipital lobe. Regional mass effect without midline shift. Punctate focus of reduced diffusion LEFT mesial thalamus, bilateral mesial parietal lobes. No susceptibility artifact to suggest hemorrhage. Mild susceptibility artifact associated with biparietal and bifrontal infarcts. Mild parenchymal brain volume. No abnormal extra-axial loss. No hydrocephalus fluid collections. VASCULAR: Chronically occluded RIGHT internal carotid artery. Remaining major flow voids preserved. SKULL AND UPPER CERVICAL SPINE: No abnormal sellar expansion. No suspicious calvarial bone marrow signal. Craniocervical junction maintained. SINUSES/ORBITS: Moderate paranasal sinus mucosal thickening without air-fluid levels. Mastoid air cells are well aerated.The included ocular globes and orbital contents are non-suspicious. OTHER: None. IMPRESSION: 1. Acute large nonhemorrhagic temporal occipital lobe/PCA territory infarct. Additional small acute posterior circulation and posterior watershed infarcts. 2. Old bifrontal and biparietal lobe infarcts in ACA and MCA territories. 3. Chronically occluded RIGHT internal carotid artery. 4. Critical Value/emergent results were called by telephone at the time of interpretation on 02/17/2018 at 8:25 pm to Dr. Azalia BilisKEVIN CAMPOS , who verbally acknowledged these results. Electronically Signed   By: Awilda Metroourtnay  Bloomer M.D.   On: 02/17/2018 20:28     ASSESSMENT AND PLAN   67 y.o. male past medical history of CVA, right CEA, hypertension, diabetes, hyperlipidemia, SVT, coronary artery disease presents with aphasia, amnesia and right homonymous hemianopsia. MRI brain shows a left PCA stroke.  Acute left PCA stroke  Risk factors : CVA, right CEA, hypertension, diabetes, hyperlipidemia, SVT, coronary artery disease Etiology: needs evaluation  Recommend # CTA head and neck #Transthoracic Echo  # Continue Plavix 75 mg #Start or  continue Atorvastatin 80 mg/other high intensity statin # BP goal: permissive HTN upto 220/120  # HBAIC and Lipid profile # Telemetry monitoring # Frequent neuro checks # NPO until passes stroke swallow screen  Please page stroke NP  Or  PA  Or MD from 8am -4 pm  as this patient from this time will be  followed by the stroke.   You can look them up on [email or web address]  Password [email or web address]   Sushanth Aroor [facility] Pager Number [phone]

## 2018-02-17 NOTE — ED Notes (Signed)
Pt transported to MRI 

## 2018-02-18 ENCOUNTER — Inpatient Hospital Stay (HOSPITAL_COMMUNITY): Payer: Medicare Other

## 2018-02-18 DIAGNOSIS — R778 Other specified abnormalities of plasma proteins: Secondary | ICD-10-CM | POA: Diagnosis present

## 2018-02-18 DIAGNOSIS — I6521 Occlusion and stenosis of right carotid artery: Secondary | ICD-10-CM

## 2018-02-18 DIAGNOSIS — I6522 Occlusion and stenosis of left carotid artery: Secondary | ICD-10-CM

## 2018-02-18 DIAGNOSIS — R7989 Other specified abnormal findings of blood chemistry: Secondary | ICD-10-CM

## 2018-02-18 DIAGNOSIS — I6340 Cerebral infarction due to embolism of unspecified cerebral artery: Secondary | ICD-10-CM

## 2018-02-18 DIAGNOSIS — I351 Nonrheumatic aortic (valve) insufficiency: Secondary | ICD-10-CM

## 2018-02-18 LAB — LIPID PANEL
Cholesterol: 94 mg/dL (ref 0–200)
HDL: 50 mg/dL (ref >40)
LDL CALC: 40 mg/dL (ref 0–99)
Total CHOL/HDL Ratio: 1.9 RATIO
Triglycerides: 19 mg/dL (ref <150)
VLDL: 4 mg/dL (ref 0–40)

## 2018-02-18 LAB — BASIC METABOLIC PANEL
ANION GAP: 7 (ref 5–15)
BUN: 9 mg/dL (ref 6–20)
CHLORIDE: 103 mmol/L (ref 101–111)
CO2: 28 mmol/L (ref 22–32)
Calcium: 9 mg/dL (ref 8.9–10.3)
Creatinine, Ser: 0.86 mg/dL (ref 0.61–1.24)
Glucose, Bld: 76 mg/dL (ref 65–99)
POTASSIUM: 3.7 mmol/L (ref 3.5–5.1)
SODIUM: 138 mmol/L (ref 135–145)

## 2018-02-18 LAB — GLUCOSE, CAPILLARY
GLUCOSE-CAPILLARY: 95 mg/dL (ref 65–99)
GLUCOSE-CAPILLARY: 132 mg/dL — AB (ref 65–99)
Glucose-Capillary: 80 mg/dL (ref 65–99)
Glucose-Capillary: 73 mg/dL (ref 65–99)

## 2018-02-18 LAB — ECHOCARDIOGRAM COMPLETE
HEIGHTINCHES: 72 in
WEIGHTICAEL: 2761.9229 [oz_av]

## 2018-02-18 LAB — HEMOGLOBIN A1C
HEMOGLOBIN A1C: 4.8 % (ref 4.8–5.6)
Mean Plasma Glucose: 91.06 mg/dL

## 2018-02-18 LAB — TROPONIN I
Troponin I: 0.06 ng/mL (ref <0.03)
Troponin I: 0.06 ng/mL (ref <0.03)
Troponin I: 0.07 ng/mL (ref <0.03)
Troponin I: 0.08 ng/mL (ref <0.03)

## 2018-02-18 LAB — CBG MONITORING, ED: GLUCOSE-CAPILLARY: 102 mg/dL — AB (ref 65–99)

## 2018-02-18 MED ORDER — ACETAMINOPHEN 325 MG PO TABS: 650.0000 mg | ORAL_TABLET | ORAL | Status: DC | PRN

## 2018-02-18 MED ORDER — ROSUVASTATIN CALCIUM 5 MG PO TABS
10.0000 mg | ORAL_TABLET | Freq: Every day | ORAL | Status: DC
  Administered 2018-02-18 – 2018-02-20 (×3): 10 mg via ORAL
  Filled 2018-02-18: qty 2
  Filled 2018-02-18: qty 1
  Filled 2018-02-18 (×2): qty 2

## 2018-02-18 MED ORDER — STROKE: EARLY STAGES OF RECOVERY BOOK
Freq: Once | Status: AC
  Administered 2018-02-18: 05:00:00
  Filled 2018-02-18: qty 1

## 2018-02-18 MED ORDER — CLOPIDOGREL BISULFATE 75 MG PO TABS
75.0000 mg | ORAL_TABLET | Freq: Every day | ORAL | Status: DC
  Administered 2018-02-18 – 2018-02-20 (×3): 75 mg via ORAL
  Filled 2018-02-18 (×3): qty 1

## 2018-02-18 MED ORDER — SODIUM CHLORIDE 0.9 % IV SOLN
INTRAVENOUS | Status: DC
  Administered 2018-02-18 – 2018-02-19 (×4): via INTRAVENOUS

## 2018-02-18 MED ORDER — LABETALOL HCL 5 MG/ML IV SOLN: 10.0000 mg | INTRAVENOUS | Status: DC | PRN

## 2018-02-18 MED ORDER — ASPIRIN EC 325 MG PO TBEC
325.0000 mg | DELAYED_RELEASE_TABLET | Freq: Every day | ORAL | Status: DC
  Administered 2018-02-18 – 2018-02-20 (×3): 325 mg via ORAL
  Filled 2018-02-18 (×3): qty 1

## 2018-02-18 MED ORDER — METOPROLOL TARTRATE 5 MG/5ML IV SOLN: 5.0000 mg | INTRAVENOUS | Status: DC | PRN

## 2018-02-18 MED ORDER — ACETAMINOPHEN 650 MG RE SUPP: 650.0000 mg | RECTAL | Status: DC | PRN

## 2018-02-18 MED ORDER — INSULIN ASPART 100 UNIT/ML ~~LOC~~ SOLN
0.0000 [IU] | Freq: Three times a day (TID) | SUBCUTANEOUS | Status: DC
  Administered 2018-02-18: 1 [IU] via SUBCUTANEOUS

## 2018-02-18 MED ORDER — INSULIN ASPART 100 UNIT/ML ~~LOC~~ SOLN: 0.0000 [IU] | Freq: Every day | SUBCUTANEOUS | Status: DC

## 2018-02-18 MED ORDER — SENNOSIDES-DOCUSATE SODIUM 8.6-50 MG PO TABS: 1.0000 | ORAL_TABLET | Freq: Every evening | ORAL | Status: DC | PRN

## 2018-02-18 MED ORDER — ACETAMINOPHEN 160 MG/5ML PO SOLN: 650.0000 mg | ORAL | Status: DC | PRN

## 2018-02-18 NOTE — Evaluation (Signed)
Speech Language Pathology Evaluation Patient Details Name: Nicholas [name] MRN: [id number] DOB: 11-15-1951 Today's Date: 02/18/2018 Time: 4098-1191 SLP Time Calculation (min) (ACUTE ONLY): 20 min  Problem List:  Patient Active Problem List   Diagnosis Date Noted  . Elevated troponin 02/18/2018  . CVA (cerebral vascular accident) (HCC) 02/17/2018  . Pressure injury of skin 10/28/2017  . SBO (small bowel obstruction) (HCC) 10/17/2017  . Bacterial UTI 03/19/2017  . Adjustment disorder   . Slow transit constipation   . Weakness of both lower extremities   . Bilateral foot-drop   . Lumbar radiculopathy 03/13/2017  . Lumbar spinal stenosis 03/13/2017  . Foot drop, right   . Urinary retention due to benign prostatic hyperplasia   . Benign essential HTN   . History of CVA (cerebrovascular accident) 03/11/2017  . Cognitive deficits, late effect of cerebrovascular disease 07/23/2014  . 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 Aortic valve disorder 08/09/2010  . CLAUDICATION 08/09/2010  . Hyperlipidemia 08/08/2010  . TOBACCO ABUSE 08/08/2010  . Essential hypertension 08/08/2010  . CAD, NATIVE VESSEL 08/08/2010  . Occlusion and stenosis of carotid artery 08/08/2010   Past Medical History:  Past Medical History:  Diagnosis Date  . At high risk for falls   . BPH (benign prostatic hyperplasia)   . BPH (benign prostatic hypertrophy)    HX RETENTION  . Carotid artery occlusion    S/P RIGHT CEA  . Coronary artery disease CARDIOLOGIST--  DR Johney Frame   S/P  PCI TO LAD 1993  . GERD (gastroesophageal reflux disease)   . History of CVA (cerebrovascular accident)    1993---  NO RESIDUALS  . History of head injury    CONCUSSION--  NO RESIDUAL  . History of myocardial infarction    1993--  NON-Q WAVE  S/P PCI TO LAD  . Hydrocele, left   . Hyperlipidemia   . Hypertension   . Left carotid artery stenosis    MILD  . Myocardial infarction (HCC)   . PSVT (paroxysmal  supraventricular tachycardia) (HCC)   . PVD (peripheral vascular disease) (HCC)    S/P LEFT FEM-POP  . Right leg weakness    USES CANE--  SECONDARY TO PVD  . SBO (small bowel obstruction) (HCC)   . Type 2 diabetes mellitus (HCC)   . Wears glasses    Past Surgical History:  Past Surgical History:  Procedure Laterality Date  . CARDIAC CATHETERIZATION  01-18-2003   DR Eden Emms   NON-OBSTRUCTIVE CAD/  PREVIOIS ANGIOPLASTY SITE WIDELY PATENT  . CARDIOVASCULAR STRESS TEST  2007   SMALL ANTERO-APICAL SCAR/  NO ISCHEMIA  . CAROTID ENDARTERECTOMY Right 05-31-2003  . CORONARY ANGIOPLASTY  1993   PCI TO LAD  . FEMORAL-POPLITEAL BYPASS GRAFT Left 01-19-2003  . HYDROCELE EXCISION Left 10/27/2013   Procedure: HYDROCELECTOMY ADULT;  Surgeon: Lindaann Slough, MD;  [facility];  Service: Urology;  Laterality: Left;  . RIGHT HYDROCELECTOMY  05-31-2003  . TRANSTHORACIC ECHOCARDIOGRAM  08-24-2010   EF 55%/  MILD AORTIC INSUFFICENCY/  MODERATE LVH  . UMBILICAL HERNIA REPAIR N/A 10/21/2017   Procedure: UMBILICAL HERNIA REPAIR;  Surgeon: Manus Rudd, MD;  Location: [facility] OR;  Service: General;  Laterality: N/A;   HPI:  Nicholas [name] is a 67 y/o with HTN, HLD, SVT, CAD with PCI to LAD , PAD s/p left fem-pop bypass and right CEA, CVA  who present with amnesia and vision loss in the right eye. Imaging with an MRI revealed a stroke.  Assessment / Plan / Recommendation Clinical Impression   Pt presents with moderate cognitive impairment on today's evaluation; however, it is unclear whether these deficits are baseline from previous CVA or new given acute stroke.  Pt has left gaze preference and had difficulty scanning to midline despite cues from therapist.  He had difficulty naming common objects and needed up to min assist semantic cues for word finding at times.  This could also be related to visual changes post CVA or secondary to cognitive dysfunction.  Language was also notable  for poor topic maintenance and circumlocutory/tangential verbal output.  Again, it is unclear whether this is to compensate for underlying language impairment versus secondary to cognitive deficits.  Cognitive impairments are characterized by decreased recall of information and decreased sustained attention to tasks.  He seems to have relatively good awareness of his limitations stating, "I don't think I can take care of myself like this."  Question pt's willingness to participate in evaluation as he was quite verbose at the beginning of evaluation and became more quiet and withdrawn with more structured tasks.    Would recommend that pt have 24/7 supervision at discharge.  Given that it does not seem that family can provide this recommended level of care (per pt, daughter and brother both work and have helped take care of him in the past) pt may need SNF placement post discharge.      SLP Assessment  SLP Recommendation/Assessment: Patient needs continued Speech Lanaguage Pathology Services SLP Visit Diagnosis: Cognitive communication deficit (R41.841)    Follow Up Recommendations  24 hour supervision/assistance;Skilled Nursing facility    Frequency and Duration min 2x/week         SLP Evaluation Cognition  Overall Cognitive Status: No family/caregiver present to determine baseline cognitive functioning Arousal/Alertness: Awake/alert Orientation Level: Oriented to person;Oriented to place;Oriented to situation;Disoriented to time Attention: Sustained Sustained Attention: Impaired Sustained Attention Impairment: Verbal basic;Functional basic Memory: Impaired Memory Impairment: Decreased recall of new information Awareness: Appears intact Behaviors: Perseveration Safety/Judgment: Impaired       Comprehension  Auditory Comprehension Overall Auditory Comprehension: Appears within functional limits for tasks assessed    Expression Expression Primary Mode of Expression: Verbal Verbal  Expression Overall Verbal Expression: Impaired Initiation: No impairment Level of Generative/Spontaneous Verbalization: Conversation Naming: Impairment Responsive: 51-75% accurate Confrontation: Impaired Pragmatics: Impairment Impairments: Eye contact;Topic maintenance Interfering Components: Attention Written Expression Dominant Hand: Right   Oral / Motor  Oral Motor/Sensory Function Overall Oral Motor/Sensory Function: Within functional limits Motor Speech Overall Motor Speech: Appears within functional limits for tasks assessed   GO                    Maryjane HurterPage, Robbi Spells L 02/18/2018, 1:47 PM

## 2018-02-18 NOTE — Evaluation (Signed)
Physical Therapy Evaluation Patient Details Name: Nicholas [name] MRN: [id number] DOB: 13-Sep-1951 Today's Date: 02/18/2018   History of Present Illness  66 y.o. male admitted for AMS and amnesia. MRI brain shows left large acute PCA infarct. PMH includes CVA, right CEA, HTN, DM, hyperlipidemia, SVT, and CAD.   Clinical Impression  Pt presents with overall decrease in functional mobility secondary to above. He demonstrates decreased cognition, strength, vision, sensation, proprioception, and balance. Pt requires mod assist +2 at this time to perform stand step transfer and demonstrates overall poor motor planning. He is unable to see on the right side and was only able to briefly track towards the right on one occasion with max verbal cues during visual testing. He also demonstrates decreased orientation, awareness, and short term memory. Per pt, he was previously mod I with a cane and has a daughter and brother that live nearby and are able to help him at home. Pt is questionable historian. Pt is very motivated to get better and be able to care for himself. Pt requires the intensive therapies provided by a comprehensive inpatient therapies to maximize his safety, functional mobility, and independence. Will continue to follow acutely and progress as tolerated.      Follow Up Recommendations CIR    Equipment Recommendations  Other (comment)(defer to next venue of care)    Recommendations for Other Services Rehab consult     Precautions / Restrictions Precautions Precautions: Fall Precaution Comments: decreased safety awareness Restrictions Weight Bearing Restrictions: No      Mobility  Bed Mobility Overal bed mobility: Needs Assistance Bed Mobility: Supine to Sit     Supine to sit: HOB elevated;Min assist Sit to supine: Min assist   General bed mobility comments: use of rails to sit EOB, assist to bring LEs off bed, VCs for sequencing  Transfers Overall transfer level: Needs  assistance Equipment used: None Transfers: Sit to/from Stand Sit to Stand: Min assist Stand pivot transfers: Min assist       General transfer comment: min assist for safety and instability with right lateral lean. tactile and verbal cues for upright posture.  Ambulation/Gait Ambulation/Gait assistance: +2 physical assistance;Mod assist Ambulation Distance (Feet): 3 Feet Assistive device: 2 person hand held assist Gait Pattern/deviations: Step-to pattern;Shuffle;Decreased weight shift to right;Decreased weight shift to left;Drifts right/left;Narrow base of support Gait velocity: decreased Gait velocity interpretation: Below normal speed for age/gender General Gait Details: ambulation demonstrated with stand step transfer. pt with very short, choppy shuffling steps and decreased weight shift bil, no foot clearance noted. pt demonstrated decreased proprioception. mod HHA +2 with verbal and tactile cues required for safety, stability, sequencing.  Stairs            Wheelchair Mobility    Modified Rankin (Stroke Patients Only)       Balance Overall balance assessment: Needs assistance Sitting-balance support: No upper extremity supported;Feet supported Sitting balance-Leahy Scale: Fair Sitting balance - Comments: right lateral lean noted but does not fall over   Standing balance support: Single extremity supported;Bilateral upper extremity supported;During functional activity Standing balance-Leahy Scale: Poor Standing balance comment: required UE support to maintain static standing                             Pertinent Vitals/Pain Pain Assessment: No/denies pain    Home Living Family/patient expects to be discharged to:: Private residence Living Arrangements: Alone(daughter and brother come by) Available Help at Discharge: Family;Available PRN/intermittently Type  of Home: Apartment Home Access: Level entry     Home Layout: One level Home Equipment: Tub  bench;Walker - 4 wheels;Cane - single point;Wheelchair - manual;Bedside commode Additional Comments: Reports he has a daughter and brother that live nearby to assist him    Prior Function Level of Independence: Independent with assistive device(s)   Gait / Transfers Assistance Needed: Uses cane for ambulation into bathroom.   ADL's / Homemaking Assistance Needed: reports that he was independent with ADLs, dauhgter provided meals and grocery shopping. pt does not drive  Comments: Uses cane for ambulation into bathroom. Rollator outside of home      Hand Dominance   Dominant Hand: Right    Extremity/Trunk Assessment   Upper Extremity Assessment Upper Extremity Assessment: Defer to OT evaluation RUE Coordination: decreased fine motor    Lower Extremity Assessment Lower Extremity Assessment: RLE deficits/detail;Difficult to assess due to impaired cognition;LLE deficits/detail RLE Deficits / Details: hip 4/5, knee 5/5, ankle 3-/5 RLE Sensation: decreased light touch;decreased proprioception LLE Deficits / Details: hip 4/5, knee 4/5, ankle 3-/5 LLE Sensation: decreased light touch;decreased proprioception       Communication   Communication: Expressive difficulties  Cognition Arousal/Alertness: Lethargic Behavior During Therapy: Flat affect Overall Cognitive Status: Impaired/Different from baseline Area of Impairment: Orientation;Attention;Memory;Following commands;Safety/judgement;Awareness;Problem solving                 Orientation Level: Disoriented to;Place;Time;Situation Current Attention Level: Focused;Sustained Memory: Decreased recall of precautions;Decreased short-term memory Following Commands: Follows one step commands inconsistently;Follows one step commands with increased time Safety/Judgement: Decreased awareness of safety;Decreased awareness of deficits Awareness: Intellectual Problem Solving: Slow processing;Decreased initiation;Difficulty  sequencing;Requires verbal cues;Requires tactile cues General Comments: pt demonstrates no short term memory recall, perseverating on his condition, pt exremely lethargic and can barely keep eyes open, required upright sitting and standing for arousal and to follow some commands.      General Comments General comments (skin integrity, edema, etc.): pt presents with acute visual impairments. pt is unable to see on right side and was unable to visualize recliner chair at all when doing stand step transfer towards right from bed into shair. he was only able to track towards right once with visual testing. demonstrates left visual gaze preference with questionable vision on left. pt unable to visualize clock just left of midline.    Exercises     Assessment/Plan    PT Assessment Patient needs continued PT services  PT Problem List Decreased strength;Decreased range of motion;Decreased balance;Decreased mobility;Decreased coordination;Decreased cognition;Decreased safety awareness;Decreased knowledge of precautions;Impaired sensation       PT Treatment Interventions DME instruction;Gait training;Stair training;Functional mobility training;Therapeutic activities;Therapeutic exercise;Balance training;Neuromuscular re-education;Cognitive remediation;Patient/family education;Manual techniques;Modalities    PT Goals (Current goals can be found in the Care Plan section)  Acute Rehab PT Goals Patient Stated Goal: to get better PT Goal Formulation: With patient Time For Goal Achievement: 03/04/18 Potential to Achieve Goals: Good    Frequency Min 4X/week   Barriers to discharge        Co-evaluation               AM-PAC PT "6 Clicks" Daily Activity  Outcome Measure Difficulty turning over in bed (including adjusting bedclothes, sheets and blankets)?: None Difficulty moving from lying on back to sitting on the side of the bed? : Unable Difficulty sitting down on and standing up from a  chair with arms (e.g., wheelchair, bedside commode, etc,.)?: Unable Help needed moving to and from a bed to chair (including  a wheelchair)?: A Lot Help needed walking in hospital room?: A Lot Help needed climbing 3-5 steps with a railing? : A Lot 6 Click Score: 12    End of Session Equipment Utilized During Treatment: Gait belt Activity Tolerance: Patient limited by lethargy Patient left: in chair;with call bell/phone within reach;with chair alarm set Nurse Communication: Mobility status PT Visit Diagnosis: Unsteadiness on feet (R26.81);Other abnormalities of gait and mobility (R26.89);Muscle weakness (generalized) (M62.81);History of falling (Z91.81)    Time: 1610-9604 PT Time Calculation (min) (ACUTE ONLY): 26 min   Charges:   PT Evaluation $PT Eval Moderate Complexity: 1 Mod PT Treatments $Therapeutic Activity: 8-22 mins   PT G Codes:        Barrie Dunker, SPT  Barrie Dunker 02/18/2018, 4:35 PM

## 2018-02-18 NOTE — H&P (View-Only) (Signed)
    [facility] has been requested to perform a transesophageal echocardiogram on Nicholas [name] for stroke.  After careful review of history and examination, the risks and benefits of transesophageal echocardiogram have been explained including risks of esophageal damage, perforation (1:10,000 risk), bleeding, pharyngeal hematoma as well as other potential complications associated with conscious sedation including aspiration, arrhythmia, respiratory failure and death. Alternatives to treatment were discussed, questions were answered. Patient is willing to proceed.   Corine ShelterLuke Nam Vossler, [place] JerseyPA-C  02/18/2018 5:21 PM

## 2018-02-18 NOTE — Progress Notes (Signed)
    [facility] has been requested to perform a transesophageal echocardiogram on Nicholas [name] for stroke.  After careful review of history and examination, the risks and benefits of transesophageal echocardiogram have been explained including risks of esophageal damage, perforation (1:10,000 risk), bleeding, pharyngeal hematoma as well as other potential complications associated with conscious sedation including aspiration, arrhythmia, respiratory failure and death. Alternatives to treatment were discussed, questions were answered. Patient is willing to proceed.   Kelina Beauchamp, PA-C  02/18/2018 5:21 PM  

## 2018-02-18 NOTE — Progress Notes (Addendum)
NEUROHOSPITALISTS STROKE TEAM - DAILY PROGRESS NOTE   ADMISSION HISTORY: Nicholas [name] is an 67 y.o. male past medical history of CVA, right CEA, hypertension, diabetes, hyperlipidemia, SVT, coronary artery disease who was found by his brother confused. Patient is a poor historian and cannot remember anything. History was obtained by chart review. Last seen normal is unclear. On arrival to emergency room MRI brain showed a left PCA stroke. Patient was admitted to hospitalist team and neurology was consulted. Patient takes Plavix at home.  Date last known well: Unclear, possibly 02/16/2018 Time last known well: Unclear  tPA Given: No outside window NIHSS: 7 Baseline MRS 0  SUBJECTIVE (INTERVAL HISTORY) No family is at the bedside. Patient is found laying in bed in NAD. Overall he feels hiscondition is unchanged. Patient continues to complain of visual deficits. Appear to have Left sided visual deficits as well as right sided deficits. Difficult to determine if these are new findings. Patient remains a very poor historian. Unsure of mentation baseline - likely baseline cognitive issues. No family at bedside to verify.   Laboratory Results  CBC:  Recent Labs  Lab 02/17/18 1609  WBC 7.5  HGB 12.4*  HCT 36.4*  MCV 101.4*  PLT 285   BMP: Recent Labs  Lab 02/17/18 1609 02/18/18 0810  NA 140 138  K 5.2* 3.7  CL 104 103  CO2 26 28  GLUCOSE 86 76  BUN 13 9  CREATININE 1.03 0.86  CALCIUM 9.5 9.0   Cardiac Enzymes:  Recent Labs  Lab 02/18/18 0032 02/18/18 0549 02/18/18 1214  TROPONINI 0.06* 0.06* 0.07*   Urinalysis:  Recent Labs  Lab 02/17/18 2110  COLORURINE YELLOW  APPEARANCEUR CLEAR  LABSPEC 1.014  PHURINE 8.0  GLUCOSEU NEGATIVE  HGBUR NEGATIVE  BILIRUBINUR NEGATIVE  KETONESUR NEGATIVE  PROTEINUR NEGATIVE  NITRITE NEGATIVE  LEUKOCYTESUR NEGATIVE   Urine Drug Screen:     Component Value Date/Time   LABOPIA NONE DETECTED 02/17/2018 2110   COCAINSCRNUR NONE DETECTED 02/17/2018 2110   LABBENZ NONE DETECTED 02/17/2018 2110   AMPHETMU NONE DETECTED 02/17/2018 2110   THCU NONE DETECTED 02/17/2018 2110   LABBARB NONE DETECTED 02/17/2018 2110    Alcohol Level:  Recent Labs  Lab 02/17/18 1609  ETH <10    Physical Examination   Vitals:   02/18/18 0836 02/18/18 0859 02/18/18 1156 02/18/18 1251  BP: (!) 152/55 (!) 163/65  (!) 148/49  Pulse: 78 81  82  Resp: 17   17  Temp: 98.9 F (37.2 C)  99.4 F (37.4 C) 99.5 F (37.5 C)  TempSrc: Oral  Oral Oral  SpO2: 98%   98%  Weight:      Height:       General - Well nourished, well developed, in no apparent distress HEENT-  Normocephalic,  Cardiovascular - Regular rate and rhythm  Respiratory - Lungs clear bilaterally. No wheezing. Abdomen - soft and non-tender, BS normal Extremities- no edema or cyanosis  Neurological Examination  Mental Status: Alert, not oriented to place or time. Speech fluent, has difficulty naming objects. Able to follow simple commands without difficulty. Cranial Nerves: II: Right homonynous hemianopsia, ? Partial Left eye field cut, patient is a very poor historian and difficult to determine degree of vision loss bilaterally III,IV, VI: ptosis not present, extra-ocular motions intact bilaterally, pupils equal, round, reactive to light and accommodation V,VII: smile symmetric, facial light touch sensation normal bilaterally VIII: hearing normal bilaterally IX,X: uvula rises symmetrically XI: bilateral shoulder shrug XII:  midline tongue extension Motor: Right : Upper extremity   5/5    Left:     Upper extremity   5/5  Lower extremity   5/5     Lower extremity   5/5 Tone and bulk:normal tone throughout; no atrophy noted Sensory: Pinprick and light touch intact throughout, bilaterally Deep Tendon Reflexes: 2+ and symmetric throughout Plantars: Right: downgoing   Left: downgoing Cerebellar: normal  finger-to-nose, normal rapid alternating movements and normal heel-to-shin test Gait: normal gait and station  Imaging Results  Ct Angio Head W Or Wo Contrast  Result Date: 02/18/2018 CLINICAL DATA:  67 y/o M; confusion and stroke. History of left carotid artery stenosis, right carotid artery endarterectomy, carotid occlusion. EXAM: CT ANGIOGRAPHY HEAD AND NECK TECHNIQUE: Multidetector CT imaging of the head and neck was performed using the standard protocol during bolus administration of intravenous contrast. Multiplanar CT image reconstructions and MIPs were obtained to evaluate the vascular anatomy. Carotid stenosis measurements (when applicable) are obtained utilizing NASCET criteria, using the distal internal carotid diameter as the denominator. CONTRAST:  50mL ISOVUE-370 IOPAMIDOL (ISOVUE-370) INJECTION 76% COMPARISON:  02/17/2018 MRI of the head. 03/11/2017 MRA of the head. FINDINGS: CT HEAD FINDINGS Brain: Multiple small areas of hypodensity within the cerebellum and large left PCA area distribution acute infarctions as seen on MRI of the head. No interval hemorrhage identified. Large right and small left chronic parietal infarctions in bilateral chronic ACA distribution infarctions. Stable chronic microvascular ischemic changes and parenchymal volume loss of the brain. No hydrocephalus, extra-axial collection, or effacement of basilar cisterns. Vascular: As below. Skull: Normal. Negative for fracture or focal lesion. Sinuses: Mild paranasal sinus mucosal thickening greatest in the ethmoid air cells. Normal aeration of mastoid air cells. Orbits are unremarkable. Orbits: No acute finding. Review of the MIP images confirms the above findings CTA NECK FINDINGS Aortic arch: Bovine variant branching. Imaged portion shows no evidence of aneurysm or dissection. No significant stenosis of the major arch vessel origins. Mild calcific atherosclerosis. Right carotid system: Proximal occlusion of right common  carotid artery and complete occlusion of right internal carotid artery. Right external carotid branches are patent likely due to retrograde flow and collateralization. Chronic postsurgical changes related to prior right carotid endarterectomy. Left carotid system: Dense calcification of the carotid bifurcation with mild 50% common carotid stenosis just proximal to the bifurcation. No dissection or occlusion. Vertebral arteries: Codominant. No evidence of dissection, stenosis (50% or greater) or occlusion. 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 OPLL and degenerative changes results in moderate to severe C5-6 canal stenosis. Other neck: Negative. Upper chest: Negative. Review of the MIP images confirms the above findings CTA HEAD FINDINGS Anterior circulation: Occluded right ICA petrous, cavernous, and paraclinoid segments. Right ICA terminal segment is patent. Otherwise no large vessel occlusion, aneurysm, or high-grade stenosis. Calcific atherosclerosis of the left carotid siphon with mild less than 50% stenosis. Posterior circulation: Left P2 occlusion (series 12, image 140). Otherwise no large vessel occlusion, stenosis, or aneurysm in the posterior circulation identified. Venous sinuses: As permitted by contrast timing, patent. Anatomic variants: Complete circle-of-Willis. Delayed phase: No abnormal intracranial enhancement. Review of the MIP images confirms the above findings IMPRESSION: 1. Left posterior cerebral artery P2 segment occlusion. 2. Stable right common carotid and internal carotid artery occlusion to the terminus. Patent right ICA terminus. 3. Otherwise patent anterior and posterior intracranial circulation without large vessel occlusion, aneurysm, stenosis, or vascular malformation. 4. Patent left carotid system.  Calcified plaque of left carotid bifurcation with mild 50% distal CCA stenosis. 5. Patent right  dominant vertebrobasilar system. No significant stenosis, aneurysm, or dissection. 6. Acute infarcts in cerebellum and left PCA distribution as seen on MRI. No acute hemorrhage. 7. Multiple chronic infarcts, parenchymal volume loss, and chronic microvascular ischemic changes of the brain as above. 8. Cervical spondylosis and OPLL with moderate to severe C5-6 canal stenosis. These results were called by telephone at the time of interpretation on 02/18/2018 at 12:47 am to Dr. Therisa Doyne , who verbally acknowledged these results. Electronically Signed   By: Mitzi Hansen M.D.   On: 02/18/2018 00:47   Dg Chest 2 View  Result Date: 02/18/2018 CLINICAL DATA:  Status post CVA.  Acute onset of visual disturbance. EXAM: CHEST - 2 VIEW COMPARISON:  With chest radiograph performed 10/17/2017 FINDINGS: The lungs are well-aerated and clear. There is no evidence of focal opacification, pleural effusion or pneumothorax. The heart is normal in size; the mediastinal contour is within normal limits. No acute osseous abnormalities are seen. IMPRESSION: No acute cardiopulmonary process seen. Electronically Signed   By: Roanna Raider M.D.   On: 02/18/2018 00:58   Ct Angio Neck W Or Wo Contrast  Result Date: 02/18/2018 CLINICAL DATA:  66 y/o M; confusion and stroke. History of left carotid artery stenosis, right carotid artery endarterectomy, carotid occlusion. EXAM: CT ANGIOGRAPHY HEAD AND NECK TECHNIQUE: Multidetector CT imaging of the head and neck was performed using the standard protocol during bolus administration of intravenous contrast. Multiplanar CT image reconstructions and MIPs were obtained to evaluate the vascular anatomy. Carotid stenosis measurements (when applicable) are obtained utilizing NASCET criteria, using the distal internal carotid diameter as the denominator. CONTRAST:  50mL ISOVUE-370 IOPAMIDOL (ISOVUE-370) INJECTION 76% COMPARISON:  02/17/2018 MRI of the head. 03/11/2017 MRA of the  head. FINDINGS: CT HEAD FINDINGS Brain: Multiple small areas of hypodensity within the cerebellum and large left PCA area distribution acute infarctions as seen on MRI of the head. No interval hemorrhage identified. Large right and small left chronic parietal infarctions in bilateral chronic ACA distribution infarctions. Stable chronic microvascular ischemic changes and parenchymal volume loss of the brain. No hydrocephalus, extra-axial collection, or effacement of basilar cisterns. Vascular: As below. Skull: Normal. Negative for fracture or focal lesion. Sinuses: Mild paranasal sinus mucosal thickening greatest in the ethmoid air cells. Normal aeration of mastoid air cells. Orbits are unremarkable. Orbits: No acute finding. Review of the MIP images confirms the above findings CTA NECK FINDINGS Aortic arch: Bovine variant branching. Imaged portion shows no evidence of aneurysm or dissection. No significant stenosis of the major arch vessel origins. Mild calcific atherosclerosis. Right carotid system: Proximal occlusion of right common carotid artery and complete occlusion of right internal carotid artery. Right external carotid branches are patent likely due to retrograde flow and collateralization. Chronic postsurgical changes related to prior right carotid endarterectomy. Left carotid system: Dense calcification of the carotid bifurcation with mild 50% common carotid stenosis just proximal to the bifurcation. No dissection or occlusion. Vertebral arteries: Codominant. No evidence of dissection, stenosis (50% or greater) or occlusion. 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 OPLL and degenerative changes results in moderate to severe C5-6 canal stenosis. Other neck: Negative. Upper chest: Negative. Review of the MIP images confirms the above findings CTA HEAD FINDINGS Anterior circulation: Occluded right ICA petrous, cavernous, and  paraclinoid segments. Right ICA terminal segment is patent. Otherwise no large vessel occlusion,  aneurysm, or high-grade stenosis. Calcific atherosclerosis of the left carotid siphon with mild less than 50% stenosis. Posterior circulation: Left P2 occlusion (series 12, image 140). Otherwise no large vessel occlusion, stenosis, or aneurysm in the posterior circulation identified. Venous sinuses: As permitted by contrast timing, patent. Anatomic variants: Complete circle-of-Willis. Delayed phase: No abnormal intracranial enhancement. Review of the MIP images confirms the above findings IMPRESSION: 1. Left posterior cerebral artery P2 segment occlusion. 2. Stable right common carotid and internal carotid artery occlusion to the terminus. Patent right ICA terminus. 3. Otherwise patent anterior and posterior intracranial circulation without large vessel occlusion, aneurysm, stenosis, or vascular malformation. 4. Patent left carotid system. Calcified plaque of left carotid bifurcation with mild 50% distal CCA stenosis. 5. Patent right dominant vertebrobasilar system. No significant stenosis, aneurysm, or dissection. 6. Acute infarcts in cerebellum and left PCA distribution as seen on MRI. No acute hemorrhage. 7. Multiple chronic infarcts, parenchymal volume loss, and chronic microvascular ischemic changes of the brain as above. 8. Cervical spondylosis and OPLL with moderate to severe C5-6 canal stenosis. These results were called by telephone at the time of interpretation on 02/18/2018 at 12:47 am to Dr. Therisa Doyne , who verbally acknowledged these results. Electronically Signed   By: Mitzi Hansen M.D.   On: 02/18/2018 00:47   Mr Brain Wo Contrast  Result Date: 02/17/2018 CLINICAL DATA:  Amnesia today and vision loss. Suspect stroke. History of hypertension, diabetes, status post RIGHT carotid endarterectomy. EXAM: MRI HEAD WITHOUT CONTRAST TECHNIQUE: Multiplanar, multiecho pulse sequences of the  brain and surrounding structures were obtained without intravenous contrast. COMPARISON:  MRI of the head March 11, 2017 FINDINGS: INTRACRANIAL CONTENTS: Confluent reduced diffusion LEFT mesial temporal occipital lobe with low ADC values. Small sub foci of reduced diffusion bilateral cerebellum and RIGHT occipital lobe. Regional mass effect without midline shift. Punctate focus of reduced diffusion LEFT mesial thalamus, bilateral mesial parietal lobes. No susceptibility artifact to suggest hemorrhage. Mild susceptibility artifact associated with biparietal and bifrontal infarcts. Mild parenchymal brain volume. No abnormal extra-axial loss. No hydrocephalus fluid collections. VASCULAR: Chronically occluded RIGHT internal carotid artery. Remaining major flow voids preserved. SKULL AND UPPER CERVICAL SPINE: No abnormal sellar expansion. No suspicious calvarial bone marrow signal. Craniocervical junction maintained. SINUSES/ORBITS: Moderate paranasal sinus mucosal thickening without air-fluid levels. Mastoid air cells are well aerated.The included ocular globes and orbital contents are non-suspicious. OTHER: None. IMPRESSION: 1. Acute large nonhemorrhagic temporal occipital lobe/PCA territory infarct. Additional small acute posterior circulation and posterior watershed infarcts. 2. Old bifrontal and biparietal lobe infarcts in ACA and MCA territories. 3. Chronically occluded RIGHT internal carotid artery. 4. Critical Value/emergent results were called by telephone at the time of interpretation on 02/17/2018 at 8:25 pm to Dr. Azalia Bilis , who verbally acknowledged these results. Electronically Signed   By: Awilda Metro M.D.   On: 02/17/2018 20:28   Echocardiogram:                                            Study Conclusions - Left ventricle: The cavity size was normal. Wall thickness was   increased in a pattern of moderate LVH. Systolic function was   normal. The estimated ejection fraction was in the range  of 60%   to 65%. Wall motion was normal; there were no regional wall   motion abnormalities. Doppler parameters are consistent with   abnormal  left ventricular relaxation (grade 1 diastolic   dysfunction). - Aortic valve: Functionally bicuspid; severely thickened, severely   calcified leaflets. Valve mobility was restricted. There was   severe stenosis. There was mild regurgitation. Valve area (VTI):   0.81 cm^2. Valve area (Vmax): 0.75 cm^2. Valve area (Vmean): 0.87   cm^2. - Mitral valve: Mild prolapse, involving the anterior leaflet. - Left atrium: The atrium was mildly dilated.  TEE /Loop Recorder:                                        PENDING IN AM  IMPRESSION AND PLAN  Mr. RONTAE [name] is a 67 y.o. male with PMH of  CVA, right CEA, hypertension, diabetes, hyperlipidemia, SVT, coronary artery disease presents with aphasia, amnesia and right homonymous hemianopsia. MRI brain reveals:  Acute left PCA stroke Small, Acute posterior circulation and posterior watershed infarcts Chronically occluded RICA  Suspected Etiology: Likely cardioembolic versus small vessel disease Resultant Symptoms: aphasia, right homonymous hemianopsia.  Stroke Risk Factors: diabetes mellitus, hyperlipidemia and hypertension Other Stroke Risk Factors: Advanced age, CVA, right CEA, SVT, CAD   Outstanding Stroke Work-up Studies:     TEE /Loop Recorder:                                             PENDING  PLAN 02/18/2018  Continue Aspirin/ Plavix/ Statin Frequent neuro checks Telemetry monitoring PT/OT/SLP Consult PM & Rehab Consult Case Management /MSW TEE and Loop Recorder Placement in AM Ongoing aggressive stroke risk factor management Patient counseled to be compliant with his antithrombotic medications Patient counseled on Lifestyle modifications including, Diet, Exercise, and Stress Follow up with [facility] in 6 weeks  NEUROLOGICAL  ISSUES  HX OF STROKES: Old bifrontal  and biparietal lobe infarcts in ACA and MCA territories Baseline Findings: Walks with cane  Chronically occluded RICA Stenosis: On DAPT, continue for now  MEDICAL ISSUES   R/O AFIB: If A.FIB found on telemetry monitoring, the patient will need AC therapy, decision will be made pending imaging and stroke team decision. TEE and Loop Recorder Placement scheduled for AM Continue Metoprolol for Rate control as needed  HYPERTENSION: Stable Permissive hypertension (OK if <220/120) for 24-48 hours post stroke and then gradually normalized within 5-7 days. Long term BP goal normotensive. May slowly restart home B/P medications after 48 hours Home Meds: yes  HYPERLIPIDEMIA:    Component Value Date/Time   CHOL 94 02/18/2018 0549   TRIG 19 02/18/2018 0549   HDL 50 02/18/2018 0549   CHOLHDL 1.9 02/18/2018 0549   VLDL 4 02/18/2018 0549   LDLCALC 40 02/18/2018 0549  Home Meds:  Crestor 10 mg LDL  goal < 70 Continued on  Crestor 10 mg daily Continue statin at discharge  R/O DIABETES: Lab Results  Component Value Date   HGBA1C 4.8 02/18/2018  HgbA1c goal < 7.0    Hospital day # 1 VTE prophylaxis:  SCD's  Diet : Fall precautions Aspiration precautions Diet heart healthy/carb modified Room service appropriate? Yes; Fluid consistency: Thin     FAMILY UPDATES: No family at bedside  TEAM UPDATES: Calvert Cantor, MD STATUS:    Discharge Information  Prior Home Stroke Medications: clopidogrel 75 mg daily  Discharge Stroke Meds:  Please discharge patient on  aspirin 325 mg daily and clopidogrel 75 mg daily     Disposition: 01-Home or Self Care Therapy Recs:               SNF  Follow up Appointments  Follow Up:  Follow-up Information    [name], MD Follow up.   Specialty:  Family Medicine Contact information: [address] [place] [address] [phone]        Micki Riley, MD. Schedule an appointment as soon as possible for a visit in 6 week(s).     Specialties:  Neurology, Radiology Contact information: [address] [place] [address] [phone]          [name], MD -PCP Follow up in 1-2 weeks       Assessment & plan discussed with with attending physician and they are in agreement.    Beryl Meager, ANP-C Stroke Neurology Team 02/18/2018, 2:14 PM  02/18/2018 ATTENDING ASSESSMENT   I reviewed above note and agree with the assessment and plan. I have made any additions or clarifications directly to the above note. Pt was seen and examined.   67 year old male with history of right carotid artery disease status post right CEA, left carotid stenosis, CAD/MI status post PCI in 1983, CVA 1983, HLD, HTN, DM, paroxysmal SVT, PVD status post bypass admitted for confusion. MRI showed left large PCA infarct, b/l cerebellar punctate infarcts and b/l distal ACA/PCA infarcts, as well as old b/l MCA and ACA infarcts. CTA head and neck showed left P2 occlusion, chronic right CCA/ICA occlusion and left CCA/ICA 50% stenosis. EF 60-65%, LDL 40 and A1C 5.4. Pt stroke cardioembolic pattern, given hx of PSVT on digoxin and cardizem at home, highly suspicious for PAF, he needs TEE and loop for further cardiac evaluation.   Pt still has right hemianopia, disorientation, left foot DF/PF deficit. He is on plavix and crestor at home, will add ASA 325mg  into the regimen due to intracranial stenosis. Continue DAPT for 3 months and then plavix alone. Continue crestor. Will follow.  Marvel Plan, MD PhD Stroke Neurology 02/18/2018 2:57 PM   To contact Stroke Provider, please refer to [email or web address]. After hours, contact General Neurology

## 2018-02-18 NOTE — Progress Notes (Signed)
Rehab Admissions Coordinator Note:  Patient was screened by Clois DupesBoyette, Lexton Hidalgo Godwin for appropriateness for an Inpatient Acute Rehab Consult per PT recommendation. Noted OT rec SNF  At this time, we are recommending Inpatient Rehab consult.  Clois DupesBoyette, Aksh Swart Godwin 02/18/2018, 5:10 PM  I can be reached at [phone].

## 2018-02-18 NOTE — Care Management Note (Signed)
Case Management Note  Patient Details  Name: Nicholas [name] MRN: [id number] Date of Birth: 11/09/1951  Subjective/Objective:     Pt admitted with CVA. He is from home with family.                Action/Plan: Awaiting PT/OT evaluations. CM following for d/c needs, physician orders.  Expected Discharge Date:                  Expected Discharge Plan:     In-House Referral:     Discharge planning Services     Post Acute Care Choice:    Choice offered to:     DME Arranged:    DME Agency:     HH Arranged:    HH Agency:     Status of Service:  In process, will continue to follow  If discussed at Long Length of Stay Meetings, dates discussed:    Additional Comments:  Kermit BaloKelli F Indya Oliveria, RN 02/18/2018, 12:38 PM

## 2018-02-18 NOTE — Evaluation (Signed)
Occupational Therapy Evaluation Patient Details Name: Nicholas [name] Berling MRN: [id number] DOB: 06/04/1951 Today's Date: 02/18/2018    History of Present Illness Nicholas [name] Flow is an 67 y.o. male past medical history of CVA, right CEA, hypertension, diabetes, hyperlipidemia, SVT, coronary artery disease who was found by his brother confused. Patient is a poor historian and cannot remember anything.On arrival to emergency room MRI brain showed a left PCA stroke   Clinical Impression   Pt with decline in function and safety with ADLs and ADL mobility with decreased strength, balance, cognition and endurance. Pt also with B LE edema. Pt reports that he wants to go home but is concerned if he can take care of himself right now. Pt would benefit from acute OT services to address impairments to maximize level of function and safety  Follow Up Recommendations  SNF    Equipment Recommendations  None recommended by OT;Other (comment)(TBD at next venue of care)    Recommendations for Other Services       Precautions / Restrictions Precautions Precautions: Fall Precaution Comments: decreased safety awareness Restrictions Weight Bearing Restrictions: No      Mobility Bed Mobility Overal bed mobility: Needs Assistance Bed Mobility: Supine to Sit;Sit to Supine     Supine to sit: Min guard;HOB elevated Sit to supine: Min assist   General bed mobility comments: used rails to sit EOB, min A with B LEs back onto bed. Difficulty lateral scooting towards HOB sitting  Transfers Overall transfer level: Needs assistance Equipment used: Rolling walker (2 wheeled) Transfers: Sit to/from [employer] CorporationStand;Stand Pivot Transfers Sit to Stand: Min assist Stand pivot transfers: Min assist       General transfer comment: cues for correct hand placement    Balance Overall balance assessment: Needs assistance Sitting-balance support: No upper extremity supported;Feet supported Sitting balance-Leahy Scale:  Fair     Standing balance support: Single extremity supported;Bilateral upper extremity supported;During functional activity Standing balance-Leahy Scale: Poor                             ADL either performed or assessed with clinical judgement   ADL Overall ADL's : Needs assistance/impaired     Grooming: Minimal assistance;Standing;Cueing for safety   Upper Body Bathing: Min guard;Sitting   Lower Body Bathing: Moderate assistance   Upper Body Dressing : Min guard;Sitting   Lower Body Dressing: Moderate assistance   Toilet Transfer: Minimal assistance;BSC;RW;Cueing for safety   Toileting- Clothing Manipulation and Hygiene: Minimal assistance;Sit to/from stand       Functional mobility during ADLs: Minimal assistance;Cueing for safety General ADL Comments: pt reports that he wans to go home but is concerned if he can take care of himself right now     Vision Patient Visual Report: No change from baseline Vision Assessment?: Yes Ocular Range of Motion: Within Functional Limits Alignment/Gaze Preference: Within Defined Limits Tracking/Visual Pursuits: Able to track stimulus in all quads without difficulty Visual Fields: No apparent deficits Additional Comments: no diplopia reported or indicated with testing     Perception     Praxis      Pertinent Vitals/Pain Pain Assessment: 0-10 Pain Score: 8  Pain Descriptors / Indicators: Sore     Hand Dominance Right   Extremity/Trunk Assessment Upper Extremity Assessment Upper Extremity Assessment: Generalized weakness;RUE deficits/detail RUE Coordination: decreased fine motor   Lower Extremity Assessment Lower Extremity Assessment: Defer to PT evaluation       Communication Communication Communication: No  difficulties   Cognition Arousal/Alertness: Awake/alert Behavior During Therapy: WFL for tasks assessed/performed Overall Cognitive Status: Impaired/Different from baseline Area of Impairment:  Orientation;Safety/judgement;Problem solving;Following commands                 Orientation Level: Disoriented to;Place;Situation     Following Commands: Follows one step commands with increased time;Follows one step commands consistently     Problem Solving: Slow processing;Decreased initiation;Difficulty sequencing;Requires verbal cues;Requires tactile cues     General Comments   pt very pleasant and cooperative, motivated to participate    Exercises     Shoulder Instructions      Home Living Family/patient expects to be discharged to:: Private residence Living Arrangements: Children Available Help at Discharge: Family;Available PRN/intermittently Type of Home: Apartment Home Access: Level entry     Home Layout: One level     Bathroom Shower/Tub: Producer, television/film/video: Standard Bathroom Accessibility: Yes How Accessible: Accessible via walker Home Equipment: Tub bench;Walker - 4 wheels;Cane - single point;Wheelchair - manual;Bedside commode   Additional Comments: Reports he has a daughter and brother that live nearby to assist hime  Lives With: Alone    Prior Functioning/Environment Level of Independence: Independent with assistive device(s)  Gait / Transfers Assistance Needed: Uses cane for ambulation into bathroom. Rollator outside of home  ADL's / Homemaking Assistance Needed: reports that he was independent with ADLs, dauhgter provided meals and grocery shopping. pt does not drive   Comments: Uses cane for ambulation into bathroom. Rollator outside of home         OT Problem List: Decreased strength;Decreased activity tolerance;Increased edema;Impaired balance (sitting and/or standing);Decreased coordination;Decreased safety awareness      OT Treatment/Interventions: Self-care/ADL training;DME and/or AE instruction;Therapeutic activities;Balance training;Therapeutic exercise;Neuromuscular education;Patient/family education    OT  Goals(Current goals can be found in the care plan section) Acute Rehab OT Goals Patient Stated Goal: get better and go home OT Goal Formulation: With patient Time For Goal Achievement: 03/04/18 Potential to Achieve Goals: Good ADL Goals Pt Will Perform Grooming: with min guard assist;standing Pt Will Perform Upper Body Bathing: with supervision;with set-up;sitting Pt Will Perform Lower Body Bathing: with min assist;sit to/from stand Pt Will Perform Upper Body Dressing: with supervision;with set-up;sitting Pt Will Transfer to Toilet: with min guard assist;ambulating;regular height toilet;grab bars Pt Will Perform Toileting - Clothing Manipulation and hygiene: with min guard assist;sit to/from stand  OT Frequency: Min 2X/week   Barriers to D/C: Decreased caregiver support          Co-evaluation              AM-PAC PT "6 Clicks" Daily Activity     Outcome Measure Help from another person eating meals?: None Help from another person taking care of personal grooming?: A Little Help from another person toileting, which includes using toliet, bedpan, or urinal?: A Lot Help from another person bathing (including washing, rinsing, drying)?: A Lot Help from another person to put on and taking off regular upper body clothing?: A Little Help from another person to put on and taking off regular lower body clothing?: A Lot 6 Click Score: 16   End of Session Equipment Utilized During Treatment: Gait belt;Other (comment);Rolling walker(BSC)  Activity Tolerance: Patient tolerated treatment well Patient left: in bed;with call bell/phone within reach;with bed alarm set  OT Visit Diagnosis: Unsteadiness on feet (R26.81);Other abnormalities of gait and mobility (R26.89);Muscle weakness (generalized) (M62.81);Other symptoms and signs involving cognitive function  Time: 1610-9604 OT Time Calculation (min): 27 min Charges:  OT General Charges $OT Visit: 1 Visit OT Evaluation $OT  Eval Moderate Complexity: 1 Mod OT Treatments $Therapeutic Activity: 8-22 mins G-Codes: OT G-codes **NOT FOR INPATIENT CLASS** Functional Assessment Tool Used: AM-PAC 6 Clicks Daily Activity     Galen Manila 02/18/2018, 2:45 PM

## 2018-02-18 NOTE — Progress Notes (Signed)
PROGRESS NOTE    Dreshon Proffit Mensing   ZOX:[id number]  DOB: October 03, 1951  DOA: 02/17/2018 PCP: [name], MD   Brief Narrative:  KAMRIN SIBLEY is a 67 y/o with HTN, HLD, SVT, CAD with PCI to LAD , PAD s/p left fem-pop bypass and right CEA, CVA  who present with amnesia and vision loss in the right eye. Imaging with an MRI revealed a stroke.   Subjective: He states his vision is back to normal today. He has no other neurological complaints such as focal weakness, numbness or speech issues. He tells me he has been taking a baby aspirin at home daily. He quit smoking a number of years ago.  ROS: no complaints of nausea, vomiting, constipation diarrhea, cough, dyspnea or dysuria. No other complaints.   Assessment & Plan:   Principal Problem:   CVA  With h/o CVA in the past - presents with amenesia and right homonymous hemianopsia-  -MRI:  Acute large nonhemorrhagic temporal occipital lobe/PCA territory infarct. Additional small acute posterior circulation and posterior watershed infarcts. - CTA head and neck: Left posterior cerebral artery P2 segment occlusion.  - Stable right common carotid and internal carotid artery occlusion  - Calcified plaque of left carotid bifurcation with mild 50% distal CCA stenosis. - LDL: 40-  He takes Crestor at home - A1c 4.8 - await ECHO and therapy evals - states he takes ASA 81 mg at home - currently on Plavix - f/u stroke team eval  Active Problems:    Essential hypertension and SVT - allow for permissive HTN- takes Diltiazem, Doxazosin, Hydralazine and Metoprolol at home - watch for SVT and give PRN Metoprolol IV if it occurs     Elevated troponin - mild elevation with flat trend- nno need for further work up    PAD - h/o R CEA and left Fem-pop bypass graft  CAD  - h/o PCI to LAD   DVT prophylaxis: SCDs Code Status: Full code Family Communication: none Disposition Plan: f/u on stroke team eval, ECHO and therapy  evals Consultants:   Neuro Procedures:    Antimicrobials:  Anti-infectives (From admission, onward)   None       Objective: Vitals:   02/18/18 0430 02/18/18 0500 02/18/18 0642 02/18/18 0700  BP: (!) 161/49 (!) 119/59  (!) 164/50  Pulse: 76 76    Resp: 20 18    Temp: 98.8 F (37.1 C)   99.3 F (37.4 C)  TempSrc: Oral   Oral  SpO2: 98% 98%    Weight:   78.3 kg (172 lb 9.9 oz)   Height:   6' (1.829 m)     Intake/Output Summary (Last 24 hours) at 02/18/2018 0741 Last data filed at 02/18/2018 0500 Gross per 24 hour  Intake 330 ml  Output 925 ml  Net -595 ml   Filed Weights   02/18/18 0642  Weight: 78.3 kg (172 lb 9.9 oz)    Examination: General exam: Appears comfortable  HEENT: PERRLA, oral mucosa moist, no sclera icterus or thrush Respiratory system: Clear to auscultation. Respiratory effort normal. Cardiovascular system: S1 & S2 heard, RRR.  No murmurs  Gastrointestinal system: Abdomen soft, non-tender, nondistended. Normal bowel sound. No organomegaly Central nervous system: Alert and oriented. No focal neurological deficits. Extremities: No cyanosis, clubbing or edema Skin: No rashes or ulcers Psychiatry:  Mood & affect appropriate.     Data Reviewed: I have personally reviewed following labs and imaging studies  CBC: Recent Labs  Lab 02/17/18 1609  WBC 7.5  HGB 12.4*  HCT 36.4*  MCV 101.4*  PLT 285   Basic Metabolic Panel: Recent Labs  Lab 02/17/18 1609  NA 140  K 5.2*  CL 104  CO2 26  GLUCOSE 86  BUN 13  CREATININE 1.03  CALCIUM 9.5   GFR: Estimated Creatinine Clearance: 77.4 mL/min (by C-G formula based on SCr of 1.03 mg/dL). Liver Function Tests: Recent Labs  Lab 02/17/18 1609  AST 45*  ALT 49  ALKPHOS 81  BILITOT 1.8*  PROT 6.3*  ALBUMIN 3.9   No results for input(s): LIPASE, AMYLASE in the last 168 hours. No results for input(s): AMMONIA in the last 168 hours. Coagulation Profile: No results for input(s): INR, PROTIME  in the last 168 hours. Cardiac Enzymes: Recent Labs  Lab 02/18/18 0032 02/18/18 0549  TROPONINI 0.06* 0.06*   BNP (last 3 results) No results for input(s): PROBNP in the last 8760 hours. HbA1C: Recent Labs    02/18/18 0549  HGBA1C 4.8   CBG: Recent Labs  Lab 02/17/18 1553 02/18/18 0029 02/18/18 0641  GLUCAP 88 102* 80   Lipid Profile: Recent Labs    02/18/18 0549  CHOL 94  HDL 50  LDLCALC 40  TRIG 19  CHOLHDL 1.9   Thyroid Function Tests: No results for input(s): TSH, T4TOTAL, FREET4, T3FREE, THYROIDAB in the last 72 hours. Anemia Panel: No results for input(s): VITAMINB12, FOLATE, FERRITIN, TIBC, IRON, RETICCTPCT in the last 72 hours. Urine analysis:    Component Value Date/Time   COLORURINE YELLOW 02/17/2018 2110   APPEARANCEUR CLEAR 02/17/2018 2110   LABSPEC 1.014 02/17/2018 2110   PHURINE 8.0 02/17/2018 2110   GLUCOSEU NEGATIVE 02/17/2018 2110   HGBUR NEGATIVE 02/17/2018 2110   BILIRUBINUR NEGATIVE 02/17/2018 2110   KETONESUR NEGATIVE 02/17/2018 2110   PROTEINUR NEGATIVE 02/17/2018 2110   UROBILINOGEN 0.2 10/09/2015 1232   NITRITE NEGATIVE 02/17/2018 2110   LEUKOCYTESUR NEGATIVE 02/17/2018 2110   Sepsis Labs: @LABRCNTIP (procalcitonin:4,lacticidven:4) )No results found for this or any previous visit (from the past 240 hour(s)).       Radiology Studies: Ct Angio Head W Or Wo Contrast  Result Date: 02/18/2018 CLINICAL DATA:  67 y/o M; confusion and stroke. History of left carotid artery stenosis, right carotid artery endarterectomy, carotid occlusion. EXAM: CT ANGIOGRAPHY HEAD AND NECK TECHNIQUE: Multidetector CT imaging of the head and neck was performed using the standard protocol during bolus administration of intravenous contrast. Multiplanar CT image reconstructions and MIPs were obtained to evaluate the vascular anatomy. Carotid stenosis measurements (when applicable) are obtained utilizing NASCET criteria, using the distal internal carotid  diameter as the denominator. CONTRAST:  50mL ISOVUE-370 IOPAMIDOL (ISOVUE-370) INJECTION 76% COMPARISON:  02/17/2018 MRI of the head. 03/11/2017 MRA of the head. FINDINGS: CT HEAD FINDINGS Brain: Multiple small areas of hypodensity within the cerebellum and large left PCA area distribution acute infarctions as seen on MRI of the head. No interval hemorrhage identified. Large right and small left chronic parietal infarctions in bilateral chronic ACA distribution infarctions. Stable chronic microvascular ischemic changes and parenchymal volume loss of the brain. No hydrocephalus, extra-axial collection, or effacement of basilar cisterns. Vascular: As below. Skull: Normal. Negative for fracture or focal lesion. Sinuses: Mild paranasal sinus mucosal thickening greatest in the ethmoid air cells. Normal aeration of mastoid air cells. Orbits are unremarkable. Orbits: No acute finding. Review of the MIP images confirms the above findings CTA NECK FINDINGS Aortic arch: Bovine variant branching. Imaged portion shows no evidence of aneurysm or dissection. No  significant stenosis of the major arch vessel origins. Mild calcific atherosclerosis. Right carotid system: Proximal occlusion of right common carotid artery and complete occlusion of right internal carotid artery. Right external carotid branches are patent likely due to retrograde flow and collateralization. Chronic postsurgical changes related to prior right carotid endarterectomy. Left carotid system: Dense calcification of the carotid bifurcation with mild 50% common carotid stenosis just proximal to the bifurcation. No dissection or occlusion. Vertebral arteries: Codominant. No evidence of dissection, stenosis (50% or greater) or occlusion. 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 OPLL and degenerative changes results in moderate to severe C5-6 canal stenosis. Other neck: Negative.  Upper chest: Negative. Review of the MIP images confirms the above findings CTA HEAD FINDINGS Anterior circulation: Occluded right ICA petrous, cavernous, and paraclinoid segments. Right ICA terminal segment is patent. Otherwise no large vessel occlusion, aneurysm, or high-grade stenosis. Calcific atherosclerosis of the left carotid siphon with mild less than 50% stenosis. Posterior circulation: Left P2 occlusion (series 12, image 140). Otherwise no large vessel occlusion, stenosis, or aneurysm in the posterior circulation identified. Venous sinuses: As permitted by contrast timing, patent. Anatomic variants: Complete circle-of-Willis. Delayed phase: No abnormal intracranial enhancement. Review of the MIP images confirms the above findings IMPRESSION: 1. Left posterior cerebral artery P2 segment occlusion. 2. Stable right common carotid and internal carotid artery occlusion to the terminus. Patent right ICA terminus. 3. Otherwise patent anterior and posterior intracranial circulation without large vessel occlusion, aneurysm, stenosis, or vascular malformation. 4. Patent left carotid system. Calcified plaque of left carotid bifurcation with mild 50% distal CCA stenosis. 5. Patent right dominant vertebrobasilar system. No significant stenosis, aneurysm, or dissection. 6. Acute infarcts in cerebellum and left PCA distribution as seen on MRI. No acute hemorrhage. 7. Multiple chronic infarcts, parenchymal volume loss, and chronic microvascular ischemic changes of the brain as above. 8. Cervical spondylosis and OPLL with moderate to severe C5-6 canal stenosis. These results were called by telephone at the time of interpretation on 02/18/2018 at 12:47 am to Dr. Therisa Doyne , who verbally acknowledged these results. Electronically Signed   By: Mitzi Hansen M.D.   On: 02/18/2018 00:47   Dg Chest 2 View  Result Date: 02/18/2018 CLINICAL DATA:  Status post CVA.  Acute onset of visual disturbance. EXAM:  CHEST - 2 VIEW COMPARISON:  With chest radiograph performed 10/17/2017 FINDINGS: The lungs are well-aerated and clear. There is no evidence of focal opacification, pleural effusion or pneumothorax. The heart is normal in size; the mediastinal contour is within normal limits. No acute osseous abnormalities are seen. IMPRESSION: No acute cardiopulmonary process seen. Electronically Signed   By: Roanna Raider M.D.   On: 02/18/2018 00:58   Ct Angio Neck W Or Wo Contrast  Result Date: 02/18/2018 CLINICAL DATA:  67 y/o M; confusion and stroke. History of left carotid artery stenosis, right carotid artery endarterectomy, carotid occlusion. EXAM: CT ANGIOGRAPHY HEAD AND NECK TECHNIQUE: Multidetector CT imaging of the head and neck was performed using the standard protocol during bolus administration of intravenous contrast. Multiplanar CT image reconstructions and MIPs were obtained to evaluate the vascular anatomy. Carotid stenosis measurements (when applicable) are obtained utilizing NASCET criteria, using the distal internal carotid diameter as the denominator. CONTRAST:  50mL ISOVUE-370 IOPAMIDOL (ISOVUE-370) INJECTION 76% COMPARISON:  02/17/2018 MRI of the head. 03/11/2017 MRA of the head. FINDINGS: CT HEAD FINDINGS Brain: Multiple small areas of hypodensity within the cerebellum and large left PCA  area distribution acute infarctions as seen on MRI of the head. No interval hemorrhage identified. Large right and small left chronic parietal infarctions in bilateral chronic ACA distribution infarctions. Stable chronic microvascular ischemic changes and parenchymal volume loss of the brain. No hydrocephalus, extra-axial collection, or effacement of basilar cisterns. Vascular: As below. Skull: Normal. Negative for fracture or focal lesion. Sinuses: Mild paranasal sinus mucosal thickening greatest in the ethmoid air cells. Normal aeration of mastoid air cells. Orbits are unremarkable. Orbits: No acute finding. Review of  the MIP images confirms the above findings CTA NECK FINDINGS Aortic arch: Bovine variant branching. Imaged portion shows no evidence of aneurysm or dissection. No significant stenosis of the major arch vessel origins. Mild calcific atherosclerosis. Right carotid system: Proximal occlusion of right common carotid artery and complete occlusion of right internal carotid artery. Right external carotid branches are patent likely due to retrograde flow and collateralization. Chronic postsurgical changes related to prior right carotid endarterectomy. Left carotid system: Dense calcification of the carotid bifurcation with mild 50% common carotid stenosis just proximal to the bifurcation. No dissection or occlusion. Vertebral arteries: Codominant. No evidence of dissection, stenosis (50% or greater) or occlusion. 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 OPLL and degenerative changes results in moderate to severe C5-6 canal stenosis. Other neck: Negative. Upper chest: Negative. Review of the MIP images confirms the above findings CTA HEAD FINDINGS Anterior circulation: Occluded right ICA petrous, cavernous, and paraclinoid segments. Right ICA terminal segment is patent. Otherwise no large vessel occlusion, aneurysm, or high-grade stenosis. Calcific atherosclerosis of the left carotid siphon with mild less than 50% stenosis. Posterior circulation: Left P2 occlusion (series 12, image 140). Otherwise no large vessel occlusion, stenosis, or aneurysm in the posterior circulation identified. Venous sinuses: As permitted by contrast timing, patent. Anatomic variants: Complete circle-of-Willis. Delayed phase: No abnormal intracranial enhancement. Review of the MIP images confirms the above findings IMPRESSION: 1. Left posterior cerebral artery P2 segment occlusion. 2. Stable right common carotid and internal carotid artery occlusion to the terminus.  Patent right ICA terminus. 3. Otherwise patent anterior and posterior intracranial circulation without large vessel occlusion, aneurysm, stenosis, or vascular malformation. 4. Patent left carotid system. Calcified plaque of left carotid bifurcation with mild 50% distal CCA stenosis. 5. Patent right dominant vertebrobasilar system. No significant stenosis, aneurysm, or dissection. 6. Acute infarcts in cerebellum and left PCA distribution as seen on MRI. No acute hemorrhage. 7. Multiple chronic infarcts, parenchymal volume loss, and chronic microvascular ischemic changes of the brain as above. 8. Cervical spondylosis and OPLL with moderate to severe C5-6 canal stenosis. These results were called by telephone at the time of interpretation on 02/18/2018 at 12:47 am to Dr. Therisa Doyne , who verbally acknowledged these results. Electronically Signed   By: Mitzi Hansen M.D.   On: 02/18/2018 00:47   Mr Brain Wo Contrast  Result Date: 02/17/2018 CLINICAL DATA:  Amnesia today and vision loss. Suspect stroke. History of hypertension, diabetes, status post RIGHT carotid endarterectomy. EXAM: MRI HEAD WITHOUT CONTRAST TECHNIQUE: Multiplanar, multiecho pulse sequences of the brain and surrounding structures were obtained without intravenous contrast. COMPARISON:  MRI of the head March 11, 2017 FINDINGS: INTRACRANIAL CONTENTS: Confluent reduced diffusion LEFT mesial temporal occipital lobe with low ADC values. Small sub foci of reduced diffusion bilateral cerebellum and RIGHT occipital lobe. Regional mass effect without midline shift. Punctate focus of reduced diffusion LEFT mesial thalamus, bilateral mesial parietal lobes. No susceptibility artifact to  suggest hemorrhage. Mild susceptibility artifact associated with biparietal and bifrontal infarcts. Mild parenchymal brain volume. No abnormal extra-axial loss. No hydrocephalus fluid collections. VASCULAR: Chronically occluded RIGHT internal carotid artery.  Remaining major flow voids preserved. SKULL AND UPPER CERVICAL SPINE: No abnormal sellar expansion. No suspicious calvarial bone marrow signal. Craniocervical junction maintained. SINUSES/ORBITS: Moderate paranasal sinus mucosal thickening without air-fluid levels. Mastoid air cells are well aerated.The included ocular globes and orbital contents are non-suspicious. OTHER: None. IMPRESSION: 1. Acute large nonhemorrhagic temporal occipital lobe/PCA territory infarct. Additional small acute posterior circulation and posterior watershed infarcts. 2. Old bifrontal and biparietal lobe infarcts in ACA and MCA territories. 3. Chronically occluded RIGHT internal carotid artery. 4. Critical Value/emergent results were called by telephone at the time of interpretation on 02/17/2018 at 8:25 pm to Dr. Azalia Bilis , who verbally acknowledged these results. Electronically Signed   By: Awilda Metro M.D.   On: 02/17/2018 20:28      Scheduled Meds: . clopidogrel  75 mg Oral Q breakfast  . insulin aspart  0-5 Units Subcutaneous QHS  . insulin aspart  0-9 Units Subcutaneous TID WC  . rosuvastatin  10 mg Oral Daily   Continuous Infusions: . sodium chloride 75 mL/hr at 02/18/18 0500     LOS: 1 day    Time spent in minutes: 35    Calvert Cantor, MD [facility] Pager: [email or web address] Password [facility] 02/18/2018, 7:41 AM

## 2018-02-18 NOTE — ED Notes (Signed)
Informed provider of positive trop  

## 2018-02-18 NOTE — Progress Notes (Signed)
Patient admitted from ED. Patient alert and oriented x 2. Patient oriented to room, monitor placed.

## 2018-02-18 NOTE — Progress Notes (Signed)
  Echocardiogram 2D Echocardiogram has been performed.  Nicholas [name] [name] 02/18/2018, 11:22 AM

## 2018-02-19 ENCOUNTER — Inpatient Hospital Stay (HOSPITAL_COMMUNITY): Payer: Medicare Other | Admitting: Certified Registered Nurse Anesthetist

## 2018-02-19 ENCOUNTER — Inpatient Hospital Stay (HOSPITAL_COMMUNITY): Payer: Medicare Other

## 2018-02-19 ENCOUNTER — Encounter (HOSPITAL_COMMUNITY): Payer: Self-pay | Admitting: Certified Registered Nurse Anesthetist

## 2018-02-19 ENCOUNTER — Encounter (HOSPITAL_COMMUNITY)
Admission: EM | Disposition: A | Discharge status: Rehabilitation Facility | Payer: Self-pay | Source: Home / Self Care | Attending: Internal Medicine

## 2018-02-19 DIAGNOSIS — I6521 Occlusion and stenosis of right carotid artery: Secondary | ICD-10-CM

## 2018-02-19 DIAGNOSIS — I10 Essential (primary) hypertension: Secondary | ICD-10-CM

## 2018-02-19 DIAGNOSIS — E7800 Pure hypercholesterolemia, unspecified: Secondary | ICD-10-CM

## 2018-02-19 DIAGNOSIS — I350 Nonrheumatic aortic (valve) stenosis: Secondary | ICD-10-CM

## 2018-02-19 DIAGNOSIS — R748 Abnormal levels of other serum enzymes: Secondary | ICD-10-CM

## 2018-02-19 DIAGNOSIS — I471 Supraventricular tachycardia: Secondary | ICD-10-CM

## 2018-02-19 DIAGNOSIS — I639 Cerebral infarction, unspecified: Secondary | ICD-10-CM

## 2018-02-19 DIAGNOSIS — R339 Retention of urine, unspecified: Secondary | ICD-10-CM

## 2018-02-19 DIAGNOSIS — I5189 Other ill-defined heart diseases: Secondary | ICD-10-CM

## 2018-02-19 DIAGNOSIS — E119 Type 2 diabetes mellitus without complications: Secondary | ICD-10-CM

## 2018-02-19 DIAGNOSIS — D62 Acute posthemorrhagic anemia: Secondary | ICD-10-CM

## 2018-02-19 DIAGNOSIS — I6522 Occlusion and stenosis of left carotid artery: Secondary | ICD-10-CM

## 2018-02-19 DIAGNOSIS — I519 Heart disease, unspecified: Secondary | ICD-10-CM

## 2018-02-19 DIAGNOSIS — I2510 Atherosclerotic heart disease of native coronary artery without angina pectoris: Secondary | ICD-10-CM

## 2018-02-19 HISTORY — PX: TEE WITHOUT CARDIOVERSION: SHX5443

## 2018-02-19 LAB — GLUCOSE, CAPILLARY
GLUCOSE-CAPILLARY: 76 mg/dL (ref 65–99)
GLUCOSE-CAPILLARY: 72 mg/dL (ref 65–99)
Glucose-Capillary: 68 mg/dL (ref 65–99)
Glucose-Capillary: 117 mg/dL — ABNORMAL HIGH (ref 65–99)

## 2018-02-19 SURGERY — ECHOCARDIOGRAM, TRANSESOPHAGEAL
Anesthesia: Monitor Anesthesia Care

## 2018-02-19 MED ORDER — PHENYLEPHRINE HCL 10 MG/ML IJ SOLN
INTRAMUSCULAR | Status: DC | PRN
  Administered 2018-02-19: 25 ug/min via INTRAVENOUS

## 2018-02-19 MED ORDER — PROPOFOL 500 MG/50ML IV EMUL
INTRAVENOUS | Status: DC | PRN
  Administered 2018-02-19: 75 ug/kg/min via INTRAVENOUS

## 2018-02-19 MED ORDER — LIDOCAINE 2% (20 MG/ML) 5 ML SYRINGE
INTRAMUSCULAR | Status: DC | PRN
  Administered 2018-02-19: 60 mg via INTRAVENOUS

## 2018-02-19 MED ORDER — PROPOFOL 10 MG/ML IV BOLUS
INTRAVENOUS | Status: DC | PRN
  Administered 2018-02-19 (×2): 20 mg via INTRAVENOUS

## 2018-02-19 MED ORDER — PHENYLEPHRINE 40 MCG/ML (10ML) SYRINGE FOR IV PUSH (FOR BLOOD PRESSURE SUPPORT)
PREFILLED_SYRINGE | INTRAVENOUS | Status: DC | PRN
  Administered 2018-02-19: 80 ug via INTRAVENOUS
  Administered 2018-02-19 (×2): 40 ug via INTRAVENOUS
  Administered 2018-02-19: 80 ug via INTRAVENOUS

## 2018-02-19 MED ORDER — SODIUM CHLORIDE 0.9% FLUSH: 3.0000 mL | INTRAVENOUS | Status: DC | PRN

## 2018-02-19 NOTE — Progress Notes (Signed)
NEUROHOSPITALISTS STROKE TEAM - DAILY PROGRESS NOTE   SUBJECTIVE (INTERVAL HISTORY) Brother is at the bedside. Patient is found laying in bed in NAD. TEE negative. Pending 30 day cardiac event monitoring vs. Loop recorder.    Laboratory Results  CBC:  Recent Labs  Lab 02/17/18 1609  WBC 7.5  HGB 12.4*  HCT 36.4*  MCV 101.4*  PLT 285   BMP: Recent Labs  Lab 02/17/18 1609 02/18/18 0810  NA 140 138  K 5.2* 3.7  CL 104 103  CO2 26 28  GLUCOSE 86 76  BUN 13 9  CREATININE 1.03 0.86  CALCIUM 9.5 9.0   Cardiac Enzymes:  Recent Labs  Lab 02/18/18 0032 02/18/18 0549 02/18/18 1214 02/18/18 1822  TROPONINI 0.06* 0.06* 0.07* 0.08*   Urinalysis:  Recent Labs  Lab 02/17/18 2110  COLORURINE YELLOW  APPEARANCEUR CLEAR  LABSPEC 1.014  PHURINE 8.0  GLUCOSEU NEGATIVE  HGBUR NEGATIVE  BILIRUBINUR NEGATIVE  KETONESUR NEGATIVE  PROTEINUR NEGATIVE  NITRITE NEGATIVE  LEUKOCYTESUR NEGATIVE   Urine Drug Screen:     Component Value Date/Time   LABOPIA NONE DETECTED 02/17/2018 2110   COCAINSCRNUR NONE DETECTED 02/17/2018 2110   LABBENZ NONE DETECTED 02/17/2018 2110   AMPHETMU NONE DETECTED 02/17/2018 2110   THCU NONE DETECTED 02/17/2018 2110   LABBARB NONE DETECTED 02/17/2018 2110    Alcohol Level:  Recent Labs  Lab 02/17/18 1609  ETH <10    Physical Examination   Vitals:   02/19/18 1116 02/19/18 1323 02/19/18 1443 02/19/18 1510  BP: (!) 143/50 (!) 168/55 (!) 140/46 (!) 152/48  Pulse:  75 69 65  Resp:  13 12 14   Temp: 98.4 F (36.9 C) 98.6 F (37 C) 98.1 F (36.7 C) 98.6 F (37 C)  TempSrc: Oral Oral Oral Oral  SpO2:  99% 99% 98%  Weight:      Height:       General - Well nourished, well developed, in no apparent distress HEENT-  Normocephalic,  Cardiovascular - Regular rate and rhythm  Respiratory - Lungs clear bilaterally. No wheezing. Abdomen - soft and non-tender, BS normal Extremities- no  edema or cyanosis  Neurological Examination  Mental Status: Alert, not oriented to place and self, but not to time. Speech fluent, has difficulty naming objects. Able to follow simple commands without difficulty. Cranial Nerves: II: Right homonynous hemianopsia, ? Partial Left eye field cut, patient is a very poor historian and difficult to determine degree of vision loss bilaterally III,IV, VI: ptosis not present, extra-ocular motions intact bilaterally, pupils equal, round, reactive to light and accommodation V,VII: smile symmetric, facial light touch sensation normal bilaterally VIII: hearing normal bilaterally IX,X: uvula rises symmetrically XI: bilateral shoulder shrug XII: midline tongue extension Motor: Right : Upper extremity   5/5    Left:     Upper extremity   5/5  Lower extremity   5/5     Lower extremity   5/5 Tone and bulk:normal tone throughout; no atrophy noted Sensory: Pinprick and light touch intact throughout, bilaterally Deep Tendon Reflexes: 2+ and symmetric throughout Plantars: Right: downgoing   Left: downgoing Cerebellar: normal finger-to-nose, normal rapid alternating movements and normal heel-to-shin test Gait: normal gait and station  Imaging Results  Ct Angio Head W Or Wo Contrast  Result Date: 02/18/2018 CLINICAL DATA:  67 y/o M; confusion and stroke. History of left carotid artery stenosis, right carotid artery endarterectomy, carotid occlusion. EXAM: CT ANGIOGRAPHY HEAD AND NECK TECHNIQUE: Multidetector CT imaging of the head and  neck was performed using the standard protocol during bolus administration of intravenous contrast. Multiplanar CT image reconstructions and MIPs were obtained to evaluate the vascular anatomy. Carotid stenosis measurements (when applicable) are obtained utilizing NASCET criteria, using the distal internal carotid diameter as the denominator. CONTRAST:  50mL ISOVUE-370 IOPAMIDOL (ISOVUE-370) INJECTION 76% COMPARISON:  02/17/2018 MRI  of the head. 03/11/2017 MRA of the head. FINDINGS: CT HEAD FINDINGS Brain: Multiple small areas of hypodensity within the cerebellum and large left PCA area distribution acute infarctions as seen on MRI of the head. No interval hemorrhage identified. Large right and small left chronic parietal infarctions in bilateral chronic ACA distribution infarctions. Stable chronic microvascular ischemic changes and parenchymal volume loss of the brain. No hydrocephalus, extra-axial collection, or effacement of basilar cisterns. Vascular: As below. Skull: Normal. Negative for fracture or focal lesion. Sinuses: Mild paranasal sinus mucosal thickening greatest in the ethmoid air cells. Normal aeration of mastoid air cells. Orbits are unremarkable. Orbits: No acute finding. Review of the MIP images confirms the above findings CTA NECK FINDINGS Aortic arch: Bovine variant branching. Imaged portion shows no evidence of aneurysm or dissection. No significant stenosis of the major arch vessel origins. Mild calcific atherosclerosis. Right carotid system: Proximal occlusion of right common carotid artery and complete occlusion of right internal carotid artery. Right external carotid branches are patent likely due to retrograde flow and collateralization. Chronic postsurgical changes related to prior right carotid endarterectomy. Left carotid system: Dense calcification of the carotid bifurcation with mild 50% common carotid stenosis just proximal to the bifurcation. No dissection or occlusion. Vertebral arteries: Codominant. No evidence of dissection, stenosis (50% or greater) or occlusion. 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 OPLL and degenerative changes results in moderate to severe C5-6 canal stenosis. Other neck: Negative. Upper chest: Negative. Review of the MIP images confirms the above findings CTA HEAD FINDINGS Anterior circulation: Occluded  right ICA petrous, cavernous, and paraclinoid segments. Right ICA terminal segment is patent. Otherwise no large vessel occlusion, aneurysm, or high-grade stenosis. Calcific atherosclerosis of the left carotid siphon with mild less than 50% stenosis. Posterior circulation: Left P2 occlusion (series 12, image 140). Otherwise no large vessel occlusion, stenosis, or aneurysm in the posterior circulation identified. Venous sinuses: As permitted by contrast timing, patent. Anatomic variants: Complete circle-of-Willis. Delayed phase: No abnormal intracranial enhancement. Review of the MIP images confirms the above findings IMPRESSION: 1. Left posterior cerebral artery P2 segment occlusion. 2. Stable right common carotid and internal carotid artery occlusion to the terminus. Patent right ICA terminus. 3. Otherwise patent anterior and posterior intracranial circulation without large vessel occlusion, aneurysm, stenosis, or vascular malformation. 4. Patent left carotid system. Calcified plaque of left carotid bifurcation with mild 50% distal CCA stenosis. 5. Patent right dominant vertebrobasilar system. No significant stenosis, aneurysm, or dissection. 6. Acute infarcts in cerebellum and left PCA distribution as seen on MRI. No acute hemorrhage. 7. Multiple chronic infarcts, parenchymal volume loss, and chronic microvascular ischemic changes of the brain as above. 8. Cervical spondylosis and OPLL with moderate to severe C5-6 canal stenosis. These results were called by telephone at the time of interpretation on 02/18/2018 at 12:47 am to Dr. Therisa Doyne , who verbally acknowledged these results. Electronically Signed   By: Mitzi Hansen M.D.   On: 02/18/2018 00:47   Dg Chest 2 View  Result Date: 02/18/2018 CLINICAL DATA:  Status post CVA.  Acute onset of visual disturbance. EXAM: CHEST - 2  VIEW COMPARISON:  With chest radiograph performed 10/17/2017 FINDINGS: The lungs are well-aerated and clear. There is  no evidence of focal opacification, pleural effusion or pneumothorax. The heart is normal in size; the mediastinal contour is within normal limits. No acute osseous abnormalities are seen. IMPRESSION: No acute cardiopulmonary process seen. Electronically Signed   By: Roanna Raider M.D.   On: 02/18/2018 00:58   Ct Angio Neck W Or Wo Contrast  Result Date: 02/18/2018 CLINICAL DATA:  67 y/o M; confusion and stroke. History of left carotid artery stenosis, right carotid artery endarterectomy, carotid occlusion. EXAM: CT ANGIOGRAPHY HEAD AND NECK TECHNIQUE: Multidetector CT imaging of the head and neck was performed using the standard protocol during bolus administration of intravenous contrast. Multiplanar CT image reconstructions and MIPs were obtained to evaluate the vascular anatomy. Carotid stenosis measurements (when applicable) are obtained utilizing NASCET criteria, using the distal internal carotid diameter as the denominator. CONTRAST:  50mL ISOVUE-370 IOPAMIDOL (ISOVUE-370) INJECTION 76% COMPARISON:  02/17/2018 MRI of the head. 03/11/2017 MRA of the head. FINDINGS: CT HEAD FINDINGS Brain: Multiple small areas of hypodensity within the cerebellum and large left PCA area distribution acute infarctions as seen on MRI of the head. No interval hemorrhage identified. Large right and small left chronic parietal infarctions in bilateral chronic ACA distribution infarctions. Stable chronic microvascular ischemic changes and parenchymal volume loss of the brain. No hydrocephalus, extra-axial collection, or effacement of basilar cisterns. Vascular: As below. Skull: Normal. Negative for fracture or focal lesion. Sinuses: Mild paranasal sinus mucosal thickening greatest in the ethmoid air cells. Normal aeration of mastoid air cells. Orbits are unremarkable. Orbits: No acute finding. Review of the MIP images confirms the above findings CTA NECK FINDINGS Aortic arch: Bovine variant branching. Imaged portion shows no  evidence of aneurysm or dissection. No significant stenosis of the major arch vessel origins. Mild calcific atherosclerosis. Right carotid system: Proximal occlusion of right common carotid artery and complete occlusion of right internal carotid artery. Right external carotid branches are patent likely due to retrograde flow and collateralization. Chronic postsurgical changes related to prior right carotid endarterectomy. Left carotid system: Dense calcification of the carotid bifurcation with mild 50% common carotid stenosis just proximal to the bifurcation. No dissection or occlusion. Vertebral arteries: Codominant. No evidence of dissection, stenosis (50% or greater) or occlusion. 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 OPLL and degenerative changes results in moderate to severe C5-6 canal stenosis. Other neck: Negative. Upper chest: Negative. Review of the MIP images confirms the above findings CTA HEAD FINDINGS Anterior circulation: Occluded right ICA petrous, cavernous, and paraclinoid segments. Right ICA terminal segment is patent. Otherwise no large vessel occlusion, aneurysm, or high-grade stenosis. Calcific atherosclerosis of the left carotid siphon with mild less than 50% stenosis. Posterior circulation: Left P2 occlusion (series 12, image 140). Otherwise no large vessel occlusion, stenosis, or aneurysm in the posterior circulation identified. Venous sinuses: As permitted by contrast timing, patent. Anatomic variants: Complete circle-of-Willis. Delayed phase: No abnormal intracranial enhancement. Review of the MIP images confirms the above findings IMPRESSION: 1. Left posterior cerebral artery P2 segment occlusion. 2. Stable right common carotid and internal carotid artery occlusion to the terminus. Patent right ICA terminus. 3. Otherwise patent anterior and posterior intracranial circulation without large vessel  occlusion, aneurysm, stenosis, or vascular malformation. 4. Patent left carotid system. Calcified plaque of left carotid bifurcation with mild 50% distal CCA stenosis. 5. Patent right dominant vertebrobasilar system. No significant stenosis,  aneurysm, or dissection. 6. Acute infarcts in cerebellum and left PCA distribution as seen on MRI. No acute hemorrhage. 7. Multiple chronic infarcts, parenchymal volume loss, and chronic microvascular ischemic changes of the brain as above. 8. Cervical spondylosis and OPLL with moderate to severe C5-6 canal stenosis. These results were called by telephone at the time of interpretation on 02/18/2018 at 12:47 am to Dr. Therisa Doyne , who verbally acknowledged these results. Electronically Signed   By: Mitzi Hansen M.D.   On: 02/18/2018 00:47   Mr Brain Wo Contrast  Result Date: 02/17/2018 CLINICAL DATA:  Amnesia today and vision loss. Suspect stroke. History of hypertension, diabetes, status post RIGHT carotid endarterectomy. EXAM: MRI HEAD WITHOUT CONTRAST TECHNIQUE: Multiplanar, multiecho pulse sequences of the brain and surrounding structures were obtained without intravenous contrast. COMPARISON:  MRI of the head March 11, 2017 FINDINGS: INTRACRANIAL CONTENTS: Confluent reduced diffusion LEFT mesial temporal occipital lobe with low ADC values. Small sub foci of reduced diffusion bilateral cerebellum and RIGHT occipital lobe. Regional mass effect without midline shift. Punctate focus of reduced diffusion LEFT mesial thalamus, bilateral mesial parietal lobes. No susceptibility artifact to suggest hemorrhage. Mild susceptibility artifact associated with biparietal and bifrontal infarcts. Mild parenchymal brain volume. No abnormal extra-axial loss. No hydrocephalus fluid collections. VASCULAR: Chronically occluded RIGHT internal carotid artery. Remaining major flow voids preserved. SKULL AND UPPER CERVICAL SPINE: No abnormal sellar expansion. No suspicious  calvarial bone marrow signal. Craniocervical junction maintained. SINUSES/ORBITS: Moderate paranasal sinus mucosal thickening without air-fluid levels. Mastoid air cells are well aerated.The included ocular globes and orbital contents are non-suspicious. OTHER: None. IMPRESSION: 1. Acute large nonhemorrhagic temporal occipital lobe/PCA territory infarct. Additional small acute posterior circulation and posterior watershed infarcts. 2. Old bifrontal and biparietal lobe infarcts in ACA and MCA territories. 3. Chronically occluded RIGHT internal carotid artery. 4. Critical Value/emergent results were called by telephone at the time of interpretation on 02/17/2018 at 8:25 pm to Dr. Azalia Bilis , who verbally acknowledged these results. Electronically Signed   By: Awilda Metro M.D.   On: 02/17/2018 20:28   Echocardiogram:                                            Study Conclusions - Left ventricle: The cavity size was normal. Wall thickness was   increased in a pattern of moderate LVH. Systolic function was   normal. The estimated ejection fraction was in the range of 60%   to 65%. Wall motion was normal; there were no regional wall   motion abnormalities. Doppler parameters are consistent with   abnormal left ventricular relaxation (grade 1 diastolic   dysfunction). - Aortic valve: Functionally bicuspid; severely thickened, severely   calcified leaflets. Valve mobility was restricted. There was   severe stenosis. There was mild regurgitation. Valve area (VTI):   0.81 cm^2. Valve area (Vmax): 0.75 cm^2. Valve area (Vmean): 0.87   cm^2. - Mitral valve: Mild prolapse, involving the anterior leaflet. - Left atrium: The atrium was mildly dilated.  TEE - No embolic source  - SEVERE AORTIC STENOSIS (4.2-5.45m/s peak velocity with severe thickening and calcification of trileaflet aortic valve)  - Trace MR  - Mild TR  - Normal EF 55%    IMPRESSION AND PLAN  Nicholas [name] is a 67 y.o.  male with PMH of  CVA, right CEA, hypertension, diabetes, hyperlipidemia, SVT, coronary  artery disease presents with aphasia, amnesia and right homonymous hemianopsia. MRI brain reveals:  Acute left PCA stroke Small, Acute posterior circulation and posterior watershed infarcts Chronically occluded RICA  Suspected Etiology: Likely cardioembolic versus small vessel disease Resultant Symptoms: aphasia, right homonymous hemianopsia.  Stroke Risk Factors: diabetes mellitus, hyperlipidemia and hypertension Other Stroke Risk Factors: Advanced age, CVA, right CEA, SVT, CAD   Outstanding Stroke Work-up Studies:     TEE unremarkable  PLAN 02/19/2018  Continue Aspirin/ Plavix/ Statin Frequent neuro checks Telemetry monitoring PT/OT/SLP Consult PM & Rehab Consult Case Management /MSW TEE and Loop Recorder Placement in AM Ongoing aggressive stroke risk factor management Patient counseled to be compliant with his antithrombotic medications Patient counseled on Lifestyle modifications including, Diet, Exercise, and Stress Follow up with [facility] in 6 weeks  NEUROLOGICAL  ISSUES  HX OF STROKES: Old bifrontal and biparietal lobe infarcts in ACA and MCA territories Baseline Findings: Walks with cane  Chronically occluded RICA Stenosis: On DAPT, continue for now  MEDICAL ISSUES   R/O AFIB: If A.FIB found on telemetry monitoring, the patient will need AC therapy, decision will be made pending imaging and stroke team decision. TEE and Loop Recorder Placement scheduled for AM Continue Metoprolol for Rate control as needed  HYPERTENSION: Stable Permissive hypertension (OK if <220/120) for 24-48 hours post stroke and then gradually normalized within 5-7 days. Long term BP goal normotensive. May slowly restart home B/P medications after 48 hours Home Meds: yes  HYPERLIPIDEMIA:    Component Value Date/Time   CHOL 94 02/18/2018 0549   TRIG 19 02/18/2018 0549   HDL 50  02/18/2018 0549   CHOLHDL 1.9 02/18/2018 0549   VLDL 4 02/18/2018 0549   LDLCALC 40 02/18/2018 0549  Home Meds:  Crestor 10 mg LDL  goal < 70 Continued on  Crestor 10 mg daily Continue statin at discharge  R/O DIABETES: Lab Results  Component Value Date   HGBA1C 4.8 02/18/2018  HgbA1c goal < 7.0    Hospital day # 2 VTE prophylaxis:  SCD's  Diet : Fall precautions Aspiration precautions Diet Carb Modified Fluid consistency: Thin; Room service appropriate? Yes     FAMILY UPDATES: No family at bedside  TEAM UPDATES: Glade Lloyd, MD STATUS:    Discharge Information  Prior Home Stroke Medications: clopidogrel 75 mg daily  Discharge Stroke Meds:  Please discharge patient on aspirin 325 mg daily and clopidogrel 75 mg daily     Disposition: 01-Home or Self Care Therapy Recs:               SNF  Follow up Appointments  Follow Up:  Follow-up Information    [name], MD Follow up.   Specialty:  Family Medicine Contact information: [address] [place] [address] [phone]        Micki Riley, MD. Schedule an appointment as soon as possible for a visit in 6 week(s).   Specialties:  Neurology, Radiology Contact information: [address] [place] [address] [phone]          [name], MD -PCP Follow up in 1-2 weeks       Assessment & plan discussed with with attending physician and they are in agreement.    Beryl Meager, ANP-C Stroke Neurology Team 02/19/2018, 5:59 PM  02/19/2018 ATTENDING ASSESSMENT   I reviewed above note and agree with the assessment and plan. I have made any additions or clarifications directly to the above note. Pt  was seen and examined.   67 year old male with history of right carotid artery disease status post right CEA, left carotid stenosis, CAD/MI status post PCI in 1983, CVA 1983, HLD, HTN, DM, paroxysmal SVT, PVD status post bypass admitted for confusion. MRI showed left large PCA  infarct, b/l cerebellar punctate infarcts and b/l distal ACA/PCA infarcts, as well as old b/l MCA and ACA infarcts. CTA head and neck showed left P2 occlusion, chronic right CCA/ICA occlusion and left CCA/ICA 50% stenosis. EF 60-65%, LDL 40 and A1C 5.4. Pt stroke cardioembolic pattern, given hx of PSVT on digoxin and cardizem at home, highly suspicious for PAF, TEE unremarkable. EP saw pt and recommend 30 day cardiac event monitoring first and if negative, then consider loop recorder.   Pt still has right hemianopia, disorientation, left foot DF/PF deficit. He is on plavix and crestor at home, will add ASA 325mg  into the regimen due to intracranial stenosis. Continue DAPT for 3 months and then plavix alone. Continue crestor.   Neurology will sign off. Please call with questions. [facility]. Thanks for the consult.   Marvel PlanJindong Dequane Strahan, MD PhD Stroke Neurology 02/19/2018 5:59 PM   To contact Stroke Provider, please refer to [email or web address]. After hours, contact General Neurology

## 2018-02-19 NOTE — Consult Note (Signed)
Physical Medicine and Rehabilitation Consult Reason for Consult: Decreased functional mobility Referring Physician: [facility]   HPI: Nicholas [name] is a 67 y.o. right handed male with history of supraventricular tachycardia, CAD/PCI maintained on Plavix, urinary retention, small bowel obstruction, diabetes mellitus. Per chart review, patient lives alone. Used a cane prior to admission. He does not drive. One level home. He has a daughter and brother in the area who work. Presented 02/17/2018 with altered mental status as well as blurred vision. MRI reviewed, showing R>L CVA. Per report, acute large nonhemorrhagic temporal occipital lobe PCA territory infarcts as well as additional small acute posterior circulation and posterior watershed infarcts. Old bifrontal and biparietal lobe infarction the ACA and MCA territories. Chronically occluded right internal carotid artery. Patient did not receive TPA. CT angiogram head and neck showed left posterior cerebral artery P2 segment occlusion. No significant aneurysms stenosis or dissection. Echocardiogram with ejection fraction of 65% grade 1 diastolic dysfunction. Await plan for TEE/loop recorder. Currently maintained on aspirin and Plavix for CVA prophylaxis. Physical therapy evaluation completed 02/18/2018 with recommendations of physical medicine rehabilitation consult.   Review of Systems  Constitutional: Negative for chills and fever.  HENT: Negative for hearing loss.   Eyes: Positive for blurred vision.  Respiratory: Negative for cough and shortness of breath.   Cardiovascular: Negative for chest pain.  Gastrointestinal: Positive for constipation. Negative for nausea and vomiting.  Genitourinary: Negative for flank pain and hematuria.       Urinary retention  Musculoskeletal: Positive for myalgias.  Neurological: Negative for seizures.  All other systems reviewed and are negative.  Past Medical History:  Diagnosis Date  . At high risk  for falls   . BPH (benign prostatic hyperplasia)   . BPH (benign prostatic hypertrophy)    HX RETENTION  . Carotid artery occlusion    S/P RIGHT CEA  . Coronary artery disease CARDIOLOGIST--  DR Johney Frame   S/P  PCI TO LAD 1993  . GERD (gastroesophageal reflux disease)   . History of CVA (cerebrovascular accident)    1993---  NO RESIDUALS  . History of head injury    CONCUSSION--  NO RESIDUAL  . History of myocardial infarction    1993--  NON-Q WAVE  S/P PCI TO LAD  . Hydrocele, left   . Hyperlipidemia   . Hypertension   . Left carotid artery stenosis    MILD  . Myocardial infarction (HCC)   . PSVT (paroxysmal supraventricular tachycardia) (HCC)   . PVD (peripheral vascular disease) (HCC)    S/P LEFT FEM-POP  . Right leg weakness    USES CANE--  SECONDARY TO PVD  . SBO (small bowel obstruction) (HCC)   . Type 2 diabetes mellitus (HCC)   . Wears glasses    Past Surgical History:  Procedure Laterality Date  . CARDIAC CATHETERIZATION  01-18-2003   DR Eden Emms   NON-OBSTRUCTIVE CAD/  PREVIOIS ANGIOPLASTY SITE WIDELY PATENT  . CARDIOVASCULAR STRESS TEST  2007   SMALL ANTERO-APICAL SCAR/  NO ISCHEMIA  . CAROTID ENDARTERECTOMY Right 05-31-2003  . CORONARY ANGIOPLASTY  1993   PCI TO LAD  . FEMORAL-POPLITEAL BYPASS GRAFT Left 01-19-2003  . HYDROCELE EXCISION Left 10/27/2013   Procedure: HYDROCELECTOMY ADULT;  Surgeon: Lindaann Slough, MD;  Location: [facility];  Service: Urology;  Laterality: Left;  . RIGHT HYDROCELECTOMY  05-31-2003  . TRANSTHORACIC ECHOCARDIOGRAM  08-24-2010   EF 55%/  MILD AORTIC INSUFFICENCY/  MODERATE LVH  . UMBILICAL HERNIA  REPAIR N/A 10/21/2017   Procedure: UMBILICAL HERNIA REPAIR;  Surgeon: Manus Rudd, MD;  Location[facility] OR;  Service: General;  Laterality: N/A;   Family History  Problem Relation Age of Onset  . Stroke Mother   . Hypertension Mother   . Hypertension Father    Social History:  reports that he has quit smoking. His  smoking use included cigarettes. He quit after 0.00 years of use. he has never used smokeless tobacco. He reports that he does not drink alcohol or use drugs. Allergies: No Known Allergies Medications Prior to Admission  Medication Sig Dispense Refill  . digoxin (LANOXIN) 0.25 MG tablet Take 1 tablet (0.25 mg total) by mouth daily. 30 tablet 0  . diltiazem (TAZTIA XT) 360 MG 24 hr capsule Take 1 capsule (360 mg total) by mouth daily. 30 capsule 0  . doxazosin (CARDURA) 2 MG tablet Take 1 tablet (2 mg total) by mouth daily. 30 tablet 0  . hydrALAZINE (APRESOLINE) 10 MG tablet Take 1 tablet (10 mg total) by mouth every 8 (eight) hours. (Patient taking differently: Take 10 mg by mouth at bedtime. ) 90 tablet 0  . metoprolol (LOPRESSOR) 50 MG tablet Take 1 tablet (50 mg total) by mouth 2 (two) times daily. 60 tablet 0  . rosuvastatin (CRESTOR) 10 MG tablet Take 1 tablet (10 mg total) by mouth daily. 30 tablet 0  . clopidogrel (PLAVIX) 75 MG tablet Take 1 tablet (75 mg total) by mouth daily with breakfast. (Patient not taking: Reported on 02/17/2018) 30 tablet 0  . mineral oil enema Place 133 mLs (1 enema total) as needed rectally for severe constipation. (Patient not taking: Reported on 02/17/2018) 133 mL 3  . polyethylene glycol (MIRALAX / GLYCOLAX) packet Take 17 g daily as needed by mouth. (Patient not taking: Reported on 02/17/2018) 14 each 0    Home: Home Living Family/patient expects to be discharged to:: Private residence Living Arrangements: Alone(daughter and brother come by) Available Help at Discharge: Family, Available PRN/intermittently Type of Home: Apartment Home Access: Level entry Home Layout: One level Bathroom Shower/Tub: Health visitor: Standard Bathroom Accessibility: Yes Home Equipment: Tub bench, Environmental consultant - 4 wheels, Cane - single point, Wheelchair - manual, Bedside commode Additional Comments: Reports he has a daughter and brother that live nearby to assist  him  Lives With: Alone  Functional History: Prior Function Level of Independence: Independent with assistive device(s) Gait / Transfers Assistance Needed: Uses cane for ambulation into bathroom.  ADL's / Homemaking Assistance Needed: reports that he was independent with ADLs, dauhgter provided meals and grocery shopping. pt does not drive Comments: Uses cane for ambulation into bathroom. Rollator outside of home  Functional Status:  Mobility: Bed Mobility Overal bed mobility: Needs Assistance Bed Mobility: Supine to Sit Supine to sit: HOB elevated, Min assist Sit to supine: Min assist General bed mobility comments: use of rails to sit EOB, assist to bring LEs off bed, VCs for sequencing Transfers Overall transfer level: Needs assistance Equipment used: None Transfers: Sit to/from Stand Sit to Stand: Min assist Stand pivot transfers: Min assist General transfer comment: min assist for safety and instability with right lateral lean. tactile and verbal cues for upright posture. Ambulation/Gait Ambulation/Gait assistance: +2 physical assistance, Mod assist Ambulation Distance (Feet): 3 Feet Assistive device: 2 person hand held assist Gait Pattern/deviations: Step-to pattern, Shuffle, Decreased weight shift to right, Decreased weight shift to left, Drifts right/left, Narrow base of support General Gait Details: ambulation demonstrated with stand step transfer.  pt with very short, choppy shuffling steps and decreased weight shift bil, no foot clearance noted. pt demonstrated decreased proprioception. mod HHA +2 with verbal and tactile cues required for safety, stability, sequencing. Gait velocity: decreased Gait velocity interpretation: Below normal speed for age/gender    ADL: ADL Overall ADL's : Needs assistance/impaired Grooming: Minimal assistance, Standing, Cueing for safety Upper Body Bathing: Min guard, Sitting Lower Body Bathing: Moderate assistance Upper Body Dressing : Min  guard, Sitting Lower Body Dressing: Moderate assistance Toilet Transfer: Minimal assistance, BSC, RW, Cueing for safety Toileting- Clothing Manipulation and Hygiene: Minimal assistance, Sit to/from stand Functional mobility during ADLs: Minimal assistance, Cueing for safety General ADL Comments: pt reports that he wans to go home but is concerned if he can take care of himself right now  Cognition: Cognition Overall Cognitive Status: Impaired/Different from baseline Arousal/Alertness: Awake/alert Orientation Level: Oriented to person, Oriented to place, Disoriented to time, Disoriented to situation Attention: Sustained Sustained Attention: Impaired Sustained Attention Impairment: Verbal basic, Functional basic Memory: Impaired Memory Impairment: Decreased recall of new information Awareness: Appears intact Behaviors: Perseveration Safety/Judgment: Impaired Cognition Arousal/Alertness: Lethargic Behavior During Therapy: Flat affect Overall Cognitive Status: Impaired/Different from baseline Area of Impairment: Orientation, Attention, Memory, Following commands, Safety/judgement, Awareness, Problem solving Orientation Level: Disoriented to, Place, Time, Situation Current Attention Level: Focused, Sustained Memory: Decreased recall of precautions, Decreased short-term memory Following Commands: Follows one step commands inconsistently, Follows one step commands with increased time Safety/Judgement: Decreased awareness of safety, Decreased awareness of deficits Awareness: Intellectual Problem Solving: Slow processing, Decreased initiation, Difficulty sequencing, Requires verbal cues, Requires tactile cues General Comments: pt demonstrates no short term memory recall, perseverating on his condition, pt exremely lethargic and can barely keep eyes open, required upright sitting and standing for arousal and to follow some commands.  Blood pressure (!) 153/53, pulse 80, temperature 99.4 F  (37.4 C), temperature source Oral, resp. rate 20, height 6' (1.829 m), weight 78.3 kg (172 lb 9.9 oz), SpO2 97 %. Physical Exam  Vitals reviewed. Constitutional: He appears well-developed and well-nourished.  HENT:  Head: Normocephalic and atraumatic.  Eyes: EOM are normal. Right eye exhibits no discharge. Left eye exhibits no discharge.  Neck: Normal range of motion. Neck supple. No thyromegaly present.  Cardiovascular: Normal rate and regular rhythm.  Respiratory: Effort normal and breath sounds normal. No respiratory distress.  GI: Soft. Bowel sounds are normal. He exhibits no distension.  Musculoskeletal:  No edema or tenderness in extremities  Neurological: He is alert.  Patient provides his date of birth but could not give appropriate age.  Follow simple commands.  Limited medical historian. Motor: 4+/5 grossly throughout  Skin: Skin is warm and dry.  Psychiatric: His affect is blunt. His speech is delayed and slurred. He is slowed.    Results for orders placed or performed during the hospital encounter of 02/17/18 (from the past 24 hour(s))  Basic metabolic panel     Status: None   Collection Time: 02/18/18  8:10 AM  Result Value Ref Range   Sodium 138 135 - 145 mmol/L   Potassium 3.7 3.5 - 5.1 mmol/L   Chloride 103 101 - 111 mmol/L   CO2 28 22 - 32 mmol/L   Glucose, Bld 76 65 - 99 mg/dL   BUN 9 6 - 20 mg/dL   Creatinine, Ser 6.96 0.61 - 1.24 mg/dL   Calcium 9.0 8.9 - 29.5 mg/dL   GFR calc non Af Amer >60 >60 mL/min   GFR calc Af Amer >  60 >60 mL/min   Anion gap 7 5 - 15  Glucose, capillary     Status: None   Collection Time: 02/18/18 11:58 AM  Result Value Ref Range   Glucose-Capillary 95 65 - 99 mg/dL   Comment 1 Notify RN    Comment 2 Document in Chart   Troponin I (q 6hr x 3)     Status: Abnormal   Collection Time: 02/18/18 12:14 PM  Result Value Ref Range   Troponin I 0.07 (HH) <0.03 ng/mL  Glucose, capillary     Status: Abnormal   Collection Time: 02/18/18   4:28 PM  Result Value Ref Range   Glucose-Capillary 132 (H) 65 - 99 mg/dL   Comment 1 Notify RN    Comment 2 Document in Chart   Troponin I (q 6hr x 3)     Status: Abnormal   Collection Time: 02/18/18  6:22 PM  Result Value Ref Range   Troponin I 0.08 (HH) <0.03 ng/mL  Glucose, capillary     Status: None   Collection Time: 02/18/18 10:19 PM  Result Value Ref Range   Glucose-Capillary 73 65 - 99 mg/dL  Glucose, capillary     Status: None   Collection Time: 02/19/18  7:01 AM  Result Value Ref Range   Glucose-Capillary 76 65 - 99 mg/dL   Ct Angio Head W Or Wo Contrast  Result Date: 02/18/2018 CLINICAL DATA:  67 y/o M; confusion and stroke. History of left carotid artery stenosis, right carotid artery endarterectomy, carotid occlusion. EXAM: CT ANGIOGRAPHY HEAD AND NECK TECHNIQUE: Multidetector CT imaging of the head and neck was performed using the standard protocol during bolus administration of intravenous contrast. Multiplanar CT image reconstructions and MIPs were obtained to evaluate the vascular anatomy. Carotid stenosis measurements (when applicable) are obtained utilizing NASCET criteria, using the distal internal carotid diameter as the denominator. CONTRAST:  50mL ISOVUE-370 IOPAMIDOL (ISOVUE-370) INJECTION 76% COMPARISON:  02/17/2018 MRI of the head. 03/11/2017 MRA of the head. FINDINGS: CT HEAD FINDINGS Brain: Multiple small areas of hypodensity within the cerebellum and large left PCA area distribution acute infarctions as seen on MRI of the head. No interval hemorrhage identified. Large right and small left chronic parietal infarctions in bilateral chronic ACA distribution infarctions. Stable chronic microvascular ischemic changes and parenchymal volume loss of the brain. No hydrocephalus, extra-axial collection, or effacement of basilar cisterns. Vascular: As below. Skull: Normal. Negative for fracture or focal lesion. Sinuses: Mild paranasal sinus mucosal thickening greatest in the  ethmoid air cells. Normal aeration of mastoid air cells. Orbits are unremarkable. Orbits: No acute finding. Review of the MIP images confirms the above findings CTA NECK FINDINGS Aortic arch: Bovine variant branching. Imaged portion shows no evidence of aneurysm or dissection. No significant stenosis of the major arch vessel origins. Mild calcific atherosclerosis. Right carotid system: Proximal occlusion of right common carotid artery and complete occlusion of right internal carotid artery. Right external carotid branches are patent likely due to retrograde flow and collateralization. Chronic postsurgical changes related to prior right carotid endarterectomy. Left carotid system: Dense calcification of the carotid bifurcation with mild 50% common carotid stenosis just proximal to the bifurcation. No dissection or occlusion. Vertebral arteries: Codominant. No evidence of dissection, stenosis (50% or greater) or occlusion. 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 OPLL and degenerative changes results in moderate to severe C5-6 canal stenosis. Other neck: Negative. Upper chest: Negative. Review of the MIP images confirms the  above findings CTA HEAD FINDINGS Anterior circulation: Occluded right ICA petrous, cavernous, and paraclinoid segments. Right ICA terminal segment is patent. Otherwise no large vessel occlusion, aneurysm, or high-grade stenosis. Calcific atherosclerosis of the left carotid siphon with mild less than 50% stenosis. Posterior circulation: Left P2 occlusion (series 12, image 140). Otherwise no large vessel occlusion, stenosis, or aneurysm in the posterior circulation identified. Venous sinuses: As permitted by contrast timing, patent. Anatomic variants: Complete circle-of-Willis. Delayed phase: No abnormal intracranial enhancement. Review of the MIP images confirms the above findings IMPRESSION: 1. Left posterior  cerebral artery P2 segment occlusion. 2. Stable right common carotid and internal carotid artery occlusion to the terminus. Patent right ICA terminus. 3. Otherwise patent anterior and posterior intracranial circulation without large vessel occlusion, aneurysm, stenosis, or vascular malformation. 4. Patent left carotid system. Calcified plaque of left carotid bifurcation with mild 50% distal CCA stenosis. 5. Patent right dominant vertebrobasilar system. No significant stenosis, aneurysm, or dissection. 6. Acute infarcts in cerebellum and left PCA distribution as seen on MRI. No acute hemorrhage. 7. Multiple chronic infarcts, parenchymal volume loss, and chronic microvascular ischemic changes of the brain as above. 8. Cervical spondylosis and OPLL with moderate to severe C5-6 canal stenosis. These results were called by telephone at the time of interpretation on 02/18/2018 at 12:47 am to Dr. Therisa Doyne , who verbally acknowledged these results. Electronically Signed   By: Mitzi Hansen M.D.   On: 02/18/2018 00:47   Dg Chest 2 View  Result Date: 02/18/2018 CLINICAL DATA:  Status post CVA.  Acute onset of visual disturbance. EXAM: CHEST - 2 VIEW COMPARISON:  With chest radiograph performed 10/17/2017 FINDINGS: The lungs are well-aerated and clear. There is no evidence of focal opacification, pleural effusion or pneumothorax. The heart is normal in size; the mediastinal contour is within normal limits. No acute osseous abnormalities are seen. IMPRESSION: No acute cardiopulmonary process seen. Electronically Signed   By: Roanna Raider M.D.   On: 02/18/2018 00:58   Ct Angio Neck W Or Wo Contrast  Result Date: 02/18/2018 CLINICAL DATA:  67 y/o M; confusion and stroke. History of left carotid artery stenosis, right carotid artery endarterectomy, carotid occlusion. EXAM: CT ANGIOGRAPHY HEAD AND NECK TECHNIQUE: Multidetector CT imaging of the head and neck was performed using the standard protocol  during bolus administration of intravenous contrast. Multiplanar CT image reconstructions and MIPs were obtained to evaluate the vascular anatomy. Carotid stenosis measurements (when applicable) are obtained utilizing NASCET criteria, using the distal internal carotid diameter as the denominator. CONTRAST:  50mL ISOVUE-370 IOPAMIDOL (ISOVUE-370) INJECTION 76% COMPARISON:  02/17/2018 MRI of the head. 03/11/2017 MRA of the head. FINDINGS: CT HEAD FINDINGS Brain: Multiple small areas of hypodensity within the cerebellum and large left PCA area distribution acute infarctions as seen on MRI of the head. No interval hemorrhage identified. Large right and small left chronic parietal infarctions in bilateral chronic ACA distribution infarctions. Stable chronic microvascular ischemic changes and parenchymal volume loss of the brain. No hydrocephalus, extra-axial collection, or effacement of basilar cisterns. Vascular: As below. Skull: Normal. Negative for fracture or focal lesion. Sinuses: Mild paranasal sinus mucosal thickening greatest in the ethmoid air cells. Normal aeration of mastoid air cells. Orbits are unremarkable. Orbits: No acute finding. Review of the MIP images confirms the above findings CTA NECK FINDINGS Aortic arch: Bovine variant branching. Imaged portion shows no evidence of aneurysm or dissection. No significant stenosis of the major arch vessel origins. Mild calcific atherosclerosis. Right carotid system: Proximal  occlusion of right common carotid artery and complete occlusion of right internal carotid artery. Right external carotid branches are patent likely due to retrograde flow and collateralization. Chronic postsurgical changes related to prior right carotid endarterectomy. Left carotid system: Dense calcification of the carotid bifurcation with mild 50% common carotid stenosis just proximal to the bifurcation. No dissection or occlusion. Vertebral arteries: Codominant. No evidence of dissection,  stenosis (50% or greater) or occlusion. 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 OPLL and degenerative changes results in moderate to severe C5-6 canal stenosis. Other neck: Negative. Upper chest: Negative. Review of the MIP images confirms the above findings CTA HEAD FINDINGS Anterior circulation: Occluded right ICA petrous, cavernous, and paraclinoid segments. Right ICA terminal segment is patent. Otherwise no large vessel occlusion, aneurysm, or high-grade stenosis. Calcific atherosclerosis of the left carotid siphon with mild less than 50% stenosis. Posterior circulation: Left P2 occlusion (series 12, image 140). Otherwise no large vessel occlusion, stenosis, or aneurysm in the posterior circulation identified. Venous sinuses: As permitted by contrast timing, patent. Anatomic variants: Complete circle-of-Willis. Delayed phase: No abnormal intracranial enhancement. Review of the MIP images confirms the above findings IMPRESSION: 1. Left posterior cerebral artery P2 segment occlusion. 2. Stable right common carotid and internal carotid artery occlusion to the terminus. Patent right ICA terminus. 3. Otherwise patent anterior and posterior intracranial circulation without large vessel occlusion, aneurysm, stenosis, or vascular malformation. 4. Patent left carotid system. Calcified plaque of left carotid bifurcation with mild 50% distal CCA stenosis. 5. Patent right dominant vertebrobasilar system. No significant stenosis, aneurysm, or dissection. 6. Acute infarcts in cerebellum and left PCA distribution as seen on MRI. No acute hemorrhage. 7. Multiple chronic infarcts, parenchymal volume loss, and chronic microvascular ischemic changes of the brain as above. 8. Cervical spondylosis and OPLL with moderate to severe C5-6 canal stenosis. These results were called by telephone at the time of interpretation on 02/18/2018 at 12:47 am to Dr.  Therisa DoyneANASTASSIA DOUTOVA , who verbally acknowledged these results. Electronically Signed   By: Mitzi HansenLance  Furusawa-Stratton M.D.   On: 02/18/2018 00:47   Mr Brain Wo Contrast  Result Date: 02/17/2018 CLINICAL DATA:  Amnesia today and vision loss. Suspect stroke. History of hypertension, diabetes, status post RIGHT carotid endarterectomy. EXAM: MRI HEAD WITHOUT CONTRAST TECHNIQUE: Multiplanar, multiecho pulse sequences of the brain and surrounding structures were obtained without intravenous contrast. COMPARISON:  MRI of the head March 11, 2017 FINDINGS: INTRACRANIAL CONTENTS: Confluent reduced diffusion LEFT mesial temporal occipital lobe with low ADC values. Small sub foci of reduced diffusion bilateral cerebellum and RIGHT occipital lobe. Regional mass effect without midline shift. Punctate focus of reduced diffusion LEFT mesial thalamus, bilateral mesial parietal lobes. No susceptibility artifact to suggest hemorrhage. Mild susceptibility artifact associated with biparietal and bifrontal infarcts. Mild parenchymal brain volume. No abnormal extra-axial loss. No hydrocephalus fluid collections. VASCULAR: Chronically occluded RIGHT internal carotid artery. Remaining major flow voids preserved. SKULL AND UPPER CERVICAL SPINE: No abnormal sellar expansion. No suspicious calvarial bone marrow signal. Craniocervical junction maintained. SINUSES/ORBITS: Moderate paranasal sinus mucosal thickening without air-fluid levels. Mastoid air cells are well aerated.The included ocular globes and orbital contents are non-suspicious. OTHER: None. IMPRESSION: 1. Acute large nonhemorrhagic temporal occipital lobe/PCA territory infarct. Additional small acute posterior circulation and posterior watershed infarcts. 2. Old bifrontal and biparietal lobe infarcts in ACA and MCA territories. 3. Chronically occluded RIGHT internal carotid artery. 4. Critical Value/emergent results were called by telephone at the time of  interpretation on  02/17/2018 at 8:25 pm to Dr. Azalia Bilis , who verbally acknowledged these results. Electronically Signed   By: Awilda Metro M.D.   On: 02/17/2018 20:28    Assessment/Plan: Diagnosis: Acute large nonhemorrhagic temporal occipital lobe PCA territory infarcts, additional small acute posterior circulation and posterior watershed infarcts Labs and images independently reviewed.  Records reviewed and summated above. Stroke: Continue secondary stroke prophylaxis and Risk Factor Modification listed below:   Antiplatelet therapy:   Blood Pressure Management:  Continue current medication with prn's with permisive HTN per primary team Statin Agent:    1. Does the need for close, 24 hr/day medical supervision in concert with the patient's rehab needs make it unreasonable for this patient to be served in a less intensive setting? Yes  2. Co-Morbidities requiring supervision/potential complications: supraventricular tachycardia (monitor HR with increased physical activity), CAD/PCI (cont meds), urinary retention (I/O cath as necessary), small bowel obstruction (monitor), diabetes mellitus (Monitor in accordance with exercise and adjust meds as necessary), diastolic dysfunction (monitor for signs/symptoms of fluid overload), ABLA (transfuse if necessary to ensure appropriate perfusion for increased activity tolerance) 3. Due to bladder management, safety, disease management, medication administration and patient education, does the patient require 24 hr/day rehab nursing? Yes 4. Does the patient require coordinated care of a physician, rehab nurse, PT (1-2 hrs/day, 5 days/week), OT (1-2 hrs/day, 5 days/week) and SLP (1-2 hrs/day, 5 days/week) to address physical and functional deficits in the context of the above medical diagnosis(es)? Yes Addressing deficits in the following areas: balance, endurance, locomotion, strength, transferring, bowel/bladder control, feeding, toileting, cognition and psychosocial  support 5. Can the patient actively participate in an intensive therapy program of at least 3 hrs of therapy per day at least 5 days per week? Potentially 6. The potential for patient to make measurable gains while on inpatient rehab is excellent 7. Anticipated functional outcomes upon discharge from inpatient rehab are supervision and min assist  with PT, supervision with OT, supervision with SLP. 8. Estimated rehab length of stay to reach the above functional goals is: 12-16days. 9. Anticipated D/C setting: Home 10. Anticipated post D/C treatments: HH therapy and Home excercise program 11. Overall Rehab/Functional Prognosis: good  RECOMMENDATIONS: This patient's condition is appropriate for continued rehabilitative care in the following setting: CIR after completion of medical workup. Patient has agreed to participate in recommended program. Yes Note that insurance prior authorization may be required for reimbursement for recommended care.  Comment: Rehab Admissions Coordinator to follow up.   Maryla Morrow, MD, ABPMR Mcarthur Rossetti Angiulli, PA-C 02/19/2018

## 2018-02-19 NOTE — Anesthesia Procedure Notes (Signed)
Procedure Name: MAC Date/Time: 02/19/2018 1:55 PM Performed by: Candis Shine, CRNA Pre-anesthesia Checklist: Patient identified, Emergency Drugs available, Suction available, Patient being monitored and Timeout performed Patient Re-evaluated:Patient Re-evaluated prior to induction Oxygen Delivery Method: Nasal cannula Dental Injury: Teeth and Oropharynx as per pre-operative assessment

## 2018-02-19 NOTE — Progress Notes (Signed)
  Echocardiogram Echocardiogram Transesophageal has been performed.  Nicholas [name] 02/19/2018, 3:01 PM

## 2018-02-19 NOTE — Anesthesia Postprocedure Evaluation (Signed)
Anesthesia Post Note  Patient: Nicholas [name]  Procedure(s) Performed: TRANSESOPHAGEAL ECHOCARDIOGRAM (TEE) (N/A )     Patient location during evaluation: PACU Anesthesia Type: MAC Level of consciousness: awake and alert Pain management: pain level controlled Vital Signs Assessment: post-procedure vital signs reviewed and stable Respiratory status: spontaneous breathing, nonlabored ventilation and respiratory function stable Cardiovascular status: stable and blood pressure returned to baseline Postop Assessment: no apparent nausea or vomiting Anesthetic complications: no    Last Vitals:  Vitals:   02/19/18 1323 02/19/18 1443  BP: (!) 168/55 (!) 140/46  Pulse: 75 69  Resp: 13 12  Temp: 37 C 36.7 C  SpO2: 99% 99%    Last Pain:  Vitals:   02/19/18 1443  TempSrc: Oral  PainSc:                  Mickenzie Stolar,W. EDMOND

## 2018-02-19 NOTE — Consult Note (Signed)
ELECTROPHYSIOLOGY CONSULT NOTE  Patient ID: Nicholas [name] MRN: [id number], DOB/AGE: Jun 09, 1951   Admit date: 02/17/2018 Date of Consult: 02/19/2018  Primary Physician: [name], MD Primary Cardiologist: Allred (2011) Reason for Consultation: Cryptogenic stroke; recommendations regarding Implantable Loop Recorder  History of Present Illness EP has been asked to evaluate Nicholas [name] for placement of an implantable loop recorder to monitor for atrial fibrillation by Dr Roda Shutters.  The patient was admitted on 02/17/2018 with confusion.  Imaging demonstrated left PCA stroke.  he has undergone workup for stroke including echocardiogram and carotid dopplers.  The patient has been monitored on telemetry which has demonstrated sinus rhythm with no arrhythmias.  Inpatient stroke work-up is to be completed with a TEE.   Echocardiogram this admission demonstrated EF 60-65%, no RWMA, grade 1 diastolic dysfunction, severe bicuspid AS, LA 38.  Lab work is reviewed.  Prior to admission, the patient denies chest pain, shortness of breath, dizziness, palpitations, or syncope.  He has had prior SVT back in 2011 and declined EPS at that time. He has not had recent recurrence.    Past Medical History:  Diagnosis Date  . At high risk for falls   . BPH (benign prostatic hyperplasia)   . BPH (benign prostatic hypertrophy)    HX RETENTION  . Carotid artery occlusion    S/P RIGHT CEA  . Coronary artery disease CARDIOLOGIST--  DR Johney Frame   S/P  PCI TO LAD 1993  . GERD (gastroesophageal reflux disease)   . History of CVA (cerebrovascular accident)    1993---  NO RESIDUALS  . History of head injury    CONCUSSION--  NO RESIDUAL  . History of myocardial infarction    1993--  NON-Q WAVE  S/P PCI TO LAD  . Hydrocele, left   . Hyperlipidemia   . Hypertension   . Left carotid artery stenosis    MILD  . Myocardial infarction (HCC)   . PSVT (paroxysmal supraventricular tachycardia) (HCC)   . PVD  (peripheral vascular disease) (HCC)    S/P LEFT FEM-POP  . Right leg weakness    USES CANE--  SECONDARY TO PVD  . SBO (small bowel obstruction) (HCC)   . Type 2 diabetes mellitus (HCC)   . Wears glasses      Surgical History:  Past Surgical History:  Procedure Laterality Date  . CARDIAC CATHETERIZATION  01-18-2003   DR Eden Emms   NON-OBSTRUCTIVE CAD/  PREVIOIS ANGIOPLASTY SITE WIDELY PATENT  . CARDIOVASCULAR STRESS TEST  2007   SMALL ANTERO-APICAL SCAR/  NO ISCHEMIA  . CAROTID ENDARTERECTOMY Right 05-31-2003  . CORONARY ANGIOPLASTY  1993   PCI TO LAD  . FEMORAL-POPLITEAL BYPASS GRAFT Left 01-19-2003  . HYDROCELE EXCISION Left 10/27/2013   Procedure: HYDROCELECTOMY ADULT;  Surgeon: Lindaann Slough, MD;  Location: [facility];  Service: Urology;  Laterality: Left;  . RIGHT HYDROCELECTOMY  05-31-2003  . TRANSTHORACIC ECHOCARDIOGRAM  08-24-2010   EF 55%/  MILD AORTIC INSUFFICENCY/  MODERATE LVH  . UMBILICAL HERNIA REPAIR N/A 10/21/2017   Procedure: UMBILICAL HERNIA REPAIR;  Surgeon: Manus Rudd, MD;  Location: [facility] OR;  Service: General;  Laterality: N/A;     Medications Prior to Admission  Medication Sig Dispense Refill Last Dose  . digoxin (LANOXIN) 0.25 MG tablet Take 1 tablet (0.25 mg total) by mouth daily. 30 tablet 0 unknown  . diltiazem (TAZTIA XT) 360 MG 24 hr capsule Take 1 capsule (360 mg total) by mouth daily. 30 capsule 0 unknown  .  doxazosin (CARDURA) 2 MG tablet Take 1 tablet (2 mg total) by mouth daily. 30 tablet 0 unknown  . hydrALAZINE (APRESOLINE) 10 MG tablet Take 1 tablet (10 mg total) by mouth every 8 (eight) hours. (Patient taking differently: Take 10 mg by mouth at bedtime. ) 90 tablet 0 unknown  . metoprolol (LOPRESSOR) 50 MG tablet Take 1 tablet (50 mg total) by mouth 2 (two) times daily. 60 tablet 0 few days ago  . rosuvastatin (CRESTOR) 10 MG tablet Take 1 tablet (10 mg total) by mouth daily. 30 tablet 0 unknown  . clopidogrel (PLAVIX) 75 MG  tablet Take 1 tablet (75 mg total) by mouth daily with breakfast. (Patient not taking: Reported on 02/17/2018) 30 tablet 0 Not Taking at Unknown time  . mineral oil enema Place 133 mLs (1 enema total) as needed rectally for severe constipation. (Patient not taking: Reported on 02/17/2018) 133 mL 3 Not Taking at Unknown time  . polyethylene glycol (MIRALAX / GLYCOLAX) packet Take 17 g daily as needed by mouth. (Patient not taking: Reported on 02/17/2018) 14 each 0 Not Taking at Unknown time    Inpatient Medications:  . aspirin EC  325 mg Oral Daily  . clopidogrel  75 mg Oral Q breakfast  . insulin aspart  0-5 Units Subcutaneous QHS  . insulin aspart  0-9 Units Subcutaneous TID WC  . rosuvastatin  10 mg Oral Daily    Allergies: No Known Allergies  Social History   Socioeconomic History  . Marital status: Legally Separated    Spouse name: Not on file  . Number of children: Not on file  . Years of education: Not on file  . Highest education level: Not on file  Social Needs  . Financial resource strain: Not on file  . Food insecurity - worry: Not on file  . Food insecurity - inability: Not on file  . Transportation needs - medical: Not on file  . Transportation needs - non-medical: Not on file  Occupational History  . Not on file  Tobacco Use  . Smoking status: Former Smoker    Years: 0.00    Types: Cigarettes  . Smokeless tobacco: Never Used  . Tobacco comment: very seldom   Substance and Sexual Activity  . Alcohol use: No  . Drug use: No  . Sexual activity: Not on file  Other Topics Concern  . Not on file  Social History Narrative  . Not on file     Family History  Problem Relation Age of Onset  . Stroke Mother   . Hypertension Mother   . Hypertension Father       Review of Systems: All other systems reviewed and are otherwise negative except as noted above.  Physical Exam: Vitals:   02/19/18 0007 02/19/18 0300 02/19/18 0814 02/19/18 1116  BP: (!) 185/53 (!)  153/53 (!) 154/55 (!) 143/50  Pulse: 83 80    Resp: 14 20    Temp: 99.5 F (37.5 C) 99.4 F (37.4 C) 98 F (36.7 C) 98.4 F (36.9 C)  TempSrc: Oral Oral Oral Oral  SpO2: 98% 97%    Weight:      Height:        GEN- The patient is well appearing, alert and oriented to person and place Head- normocephalic, atraumatic Eyes-  Sclera clear, conjunctiva pink Ears- hearing intact Oropharynx- clear Neck- supple Lungs- Clear to ausculation bilaterally, normal work of breathing Heart- Regular rate and rhythm  GI- soft, NT, ND, + BS Extremities-  no clubbing, cyanosis, or edema MS- no significant deformity or atrophy Skin- no rash or lesion Psych- euthymic mood, full affect   Labs:   Lab Results  Component Value Date   WBC 7.5 02/17/2018   HGB 12.4 (L) 02/17/2018   HCT 36.4 (L) 02/17/2018   MCV 101.4 (H) 02/17/2018   PLT 285 02/17/2018    Recent Labs  Lab 02/17/18 1609 02/18/18 0810  NA 140 138  K 5.2* 3.7  CL 104 103  CO2 26 28  BUN 13 9  CREATININE 1.03 0.86  CALCIUM 9.5 9.0  PROT 6.3*  --   BILITOT 1.8*  --   ALKPHOS 81  --   ALT 49  --   AST 45*  --   GLUCOSE 86 76     Radiology/Studies: Ct Angio Head W Or Wo Contrast  Result Date: 02/18/2018 CLINICAL DATA:  67 y/o M; confusion and stroke. History of left carotid artery stenosis, right carotid artery endarterectomy, carotid occlusion. EXAM: CT ANGIOGRAPHY HEAD AND NECK TECHNIQUE: Multidetector CT imaging of the head and neck was performed using the standard protocol during bolus administration of intravenous contrast. Multiplanar CT image reconstructions and MIPs were obtained to evaluate the vascular anatomy. Carotid stenosis measurements (when applicable) are obtained utilizing NASCET criteria, using the distal internal carotid diameter as the denominator. CONTRAST:  50mL ISOVUE-370 IOPAMIDOL (ISOVUE-370) INJECTION 76% COMPARISON:  02/17/2018 MRI of the head. 03/11/2017 MRA of the head. FINDINGS: CT HEAD FINDINGS  Brain: Multiple small areas of hypodensity within the cerebellum and large left PCA area distribution acute infarctions as seen on MRI of the head. No interval hemorrhage identified. Large right and small left chronic parietal infarctions in bilateral chronic ACA distribution infarctions. Stable chronic microvascular ischemic changes and parenchymal volume loss of the brain. No hydrocephalus, extra-axial collection, or effacement of basilar cisterns. Vascular: As below. Skull: Normal. Negative for fracture or focal lesion. Sinuses: Mild paranasal sinus mucosal thickening greatest in the ethmoid air cells. Normal aeration of mastoid air cells. Orbits are unremarkable. Orbits: No acute finding. Review of the MIP images confirms the above findings CTA NECK FINDINGS Aortic arch: Bovine variant branching. Imaged portion shows no evidence of aneurysm or dissection. No significant stenosis of the major arch vessel origins. Mild calcific atherosclerosis. Right carotid system: Proximal occlusion of right common carotid artery and complete occlusion of right internal carotid artery. Right external carotid branches are patent likely due to retrograde flow and collateralization. Chronic postsurgical changes related to prior right carotid endarterectomy. Left carotid system: Dense calcification of the carotid bifurcation with mild 50% common carotid stenosis just proximal to the bifurcation. No dissection or occlusion. Vertebral arteries: Codominant. No evidence of dissection, stenosis (50% or greater) or occlusion. 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 OPLL and degenerative changes results in moderate to severe C5-6 canal stenosis. Other neck: Negative. Upper chest: Negative. Review of the MIP images confirms the above findings CTA HEAD FINDINGS Anterior circulation: Occluded right ICA petrous, cavernous, and paraclinoid segments. Right ICA  terminal segment is patent. Otherwise no large vessel occlusion, aneurysm, or high-grade stenosis. Calcific atherosclerosis of the left carotid siphon with mild less than 50% stenosis. Posterior circulation: Left P2 occlusion (series 12, image 140). Otherwise no large vessel occlusion, stenosis, or aneurysm in the posterior circulation identified. Venous sinuses: As permitted by contrast timing, patent. Anatomic variants: Complete circle-of-Willis. Delayed phase: No abnormal intracranial enhancement. Review of the MIP images confirms the above  findings IMPRESSION: 1. Left posterior cerebral artery P2 segment occlusion. 2. Stable right common carotid and internal carotid artery occlusion to the terminus. Patent right ICA terminus. 3. Otherwise patent anterior and posterior intracranial circulation without large vessel occlusion, aneurysm, stenosis, or vascular malformation. 4. Patent left carotid system. Calcified plaque of left carotid bifurcation with mild 50% distal CCA stenosis. 5. Patent right dominant vertebrobasilar system. No significant stenosis, aneurysm, or dissection. 6. Acute infarcts in cerebellum and left PCA distribution as seen on MRI. No acute hemorrhage. 7. Multiple chronic infarcts, parenchymal volume loss, and chronic microvascular ischemic changes of the brain as above. 8. Cervical spondylosis and OPLL with moderate to severe C5-6 canal stenosis. These results were called by telephone at the time of interpretation on 02/18/2018 at 12:47 am to Dr. Therisa Doyne , who verbally acknowledged these results. Electronically Signed   By: Mitzi Hansen M.D.   On: 02/18/2018 00:47   Dg Chest 2 View  Result Date: 02/18/2018 CLINICAL DATA:  Status post CVA.  Acute onset of visual disturbance. EXAM: CHEST - 2 VIEW COMPARISON:  With chest radiograph performed 10/17/2017 FINDINGS: The lungs are well-aerated and clear. There is no evidence of focal opacification, pleural effusion or  pneumothorax. The heart is normal in size; the mediastinal contour is within normal limits. No acute osseous abnormalities are seen. IMPRESSION: No acute cardiopulmonary process seen. Electronically Signed   By: Roanna Raider M.D.   On: 02/18/2018 00:58   Ct Angio Neck W Or Wo Contrast  Result Date: 02/18/2018 CLINICAL DATA:  67 y/o M; confusion and stroke. History of left carotid artery stenosis, right carotid artery endarterectomy, carotid occlusion. EXAM: CT ANGIOGRAPHY HEAD AND NECK TECHNIQUE: Multidetector CT imaging of the head and neck was performed using the standard protocol during bolus administration of intravenous contrast. Multiplanar CT image reconstructions and MIPs were obtained to evaluate the vascular anatomy. Carotid stenosis measurements (when applicable) are obtained utilizing NASCET criteria, using the distal internal carotid diameter as the denominator. CONTRAST:  50mL ISOVUE-370 IOPAMIDOL (ISOVUE-370) INJECTION 76% COMPARISON:  02/17/2018 MRI of the head. 03/11/2017 MRA of the head. FINDINGS: CT HEAD FINDINGS Brain: Multiple small areas of hypodensity within the cerebellum and large left PCA area distribution acute infarctions as seen on MRI of the head. No interval hemorrhage identified. Large right and small left chronic parietal infarctions in bilateral chronic ACA distribution infarctions. Stable chronic microvascular ischemic changes and parenchymal volume loss of the brain. No hydrocephalus, extra-axial collection, or effacement of basilar cisterns. Vascular: As below. Skull: Normal. Negative for fracture or focal lesion. Sinuses: Mild paranasal sinus mucosal thickening greatest in the ethmoid air cells. Normal aeration of mastoid air cells. Orbits are unremarkable. Orbits: No acute finding. Review of the MIP images confirms the above findings CTA NECK FINDINGS Aortic arch: Bovine variant branching. Imaged portion shows no evidence of aneurysm or dissection. No significant stenosis  of the major arch vessel origins. Mild calcific atherosclerosis. Right carotid system: Proximal occlusion of right common carotid artery and complete occlusion of right internal carotid artery. Right external carotid branches are patent likely due to retrograde flow and collateralization. Chronic postsurgical changes related to prior right carotid endarterectomy. Left carotid system: Dense calcification of the carotid bifurcation with mild 50% common carotid stenosis just proximal to the bifurcation. No dissection or occlusion. Vertebral arteries: Codominant. No evidence of dissection, stenosis (50% or greater) or occlusion. 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 OPLL and degenerative  changes results in moderate to severe C5-6 canal stenosis. Other neck: Negative. Upper chest: Negative. Review of the MIP images confirms the above findings CTA HEAD FINDINGS Anterior circulation: Occluded right ICA petrous, cavernous, and paraclinoid segments. Right ICA terminal segment is patent. Otherwise no large vessel occlusion, aneurysm, or high-grade stenosis. Calcific atherosclerosis of the left carotid siphon with mild less than 50% stenosis. Posterior circulation: Left P2 occlusion (series 12, image 140). Otherwise no large vessel occlusion, stenosis, or aneurysm in the posterior circulation identified. Venous sinuses: As permitted by contrast timing, patent. Anatomic variants: Complete circle-of-Willis. Delayed phase: No abnormal intracranial enhancement. Review of the MIP images confirms the above findings IMPRESSION: 1. Left posterior cerebral artery P2 segment occlusion. 2. Stable right common carotid and internal carotid artery occlusion to the terminus. Patent right ICA terminus. 3. Otherwise patent anterior and posterior intracranial circulation without large vessel occlusion, aneurysm, stenosis, or vascular malformation. 4. Patent  left carotid system. Calcified plaque of left carotid bifurcation with mild 50% distal CCA stenosis. 5. Patent right dominant vertebrobasilar system. No significant stenosis, aneurysm, or dissection. 6. Acute infarcts in cerebellum and left PCA distribution as seen on MRI. No acute hemorrhage. 7. Multiple chronic infarcts, parenchymal volume loss, and chronic microvascular ischemic changes of the brain as above. 8. Cervical spondylosis and OPLL with moderate to severe C5-6 canal stenosis. These results were called by telephone at the time of interpretation on 02/18/2018 at 12:47 am to Dr. Therisa Doyne , who verbally acknowledged these results. Electronically Signed   By: Mitzi Hansen M.D.   On: 02/18/2018 00:47   Mr Brain Wo Contrast  Result Date: 02/17/2018 CLINICAL DATA:  Amnesia today and vision loss. Suspect stroke. History of hypertension, diabetes, status post RIGHT carotid endarterectomy. EXAM: MRI HEAD WITHOUT CONTRAST TECHNIQUE: Multiplanar, multiecho pulse sequences of the brain and surrounding structures were obtained without intravenous contrast. COMPARISON:  MRI of the head March 11, 2017 FINDINGS: INTRACRANIAL CONTENTS: Confluent reduced diffusion LEFT mesial temporal occipital lobe with low ADC values. Small sub foci of reduced diffusion bilateral cerebellum and RIGHT occipital lobe. Regional mass effect without midline shift. Punctate focus of reduced diffusion LEFT mesial thalamus, bilateral mesial parietal lobes. No susceptibility artifact to suggest hemorrhage. Mild susceptibility artifact associated with biparietal and bifrontal infarcts. Mild parenchymal brain volume. No abnormal extra-axial loss. No hydrocephalus fluid collections. VASCULAR: Chronically occluded RIGHT internal carotid artery. Remaining major flow voids preserved. SKULL AND UPPER CERVICAL SPINE: No abnormal sellar expansion. No suspicious calvarial bone marrow signal. Craniocervical junction maintained.  SINUSES/ORBITS: Moderate paranasal sinus mucosal thickening without air-fluid levels. Mastoid air cells are well aerated.The included ocular globes and orbital contents are non-suspicious. OTHER: None. IMPRESSION: 1. Acute large nonhemorrhagic temporal occipital lobe/PCA territory infarct. Additional small acute posterior circulation and posterior watershed infarcts. 2. Old bifrontal and biparietal lobe infarcts in ACA and MCA territories. 3. Chronically occluded RIGHT internal carotid artery. 4. Critical Value/emergent results were called by telephone at the time of interpretation on 02/17/2018 at 8:25 pm to Dr. Azalia Bilis , who verbally acknowledged these results. Electronically Signed   By: Awilda Metro M.D.   On: 02/17/2018 20:28    12-lead ECG sinus rhythm, PVC's (personally reviewed) All prior EKG's in EPIC reviewed with no documented atrial fibrillation  Telemetry sinus rhythm, PAC, PVC (personally reviewed)  Assessment and Plan:  1. Cryptogenic stroke The patient presents with cryptogenic stroke.  The patient has a TEE planned for later today.  He has bicuspid aortic valve with AS  on echo this admission. Will need further evaluation by TEE today. With confusion, may be most reasonable to place 30 day monitor followed by ILR once he has a chance to recover neurologically. Will discuss with daughter when she arrives.   Dr Graciela Husbands to see later today Please call with questions.   Gypsy Balsam, NP 02/19/2018 11:39 AM

## 2018-02-19 NOTE — Progress Notes (Signed)
Patient ID: Nicholas [name], male   DOB: 02/25/1951, 67 y.o.   MRN: [id number]  PROGRESS NOTE    Melodye PedWillie J Smoots  WUJ:[id number] DOB: 10/16/1951 DOA: 02/17/2018 PCP: Leilani Ableeese, Betti, MD   Brief Narrative:  67 year old male with history of hypertension, hyperlipidemia, SVT, CAD with PCI to LAD, PAD status post left femoropopliteal bypass and right CEA, CVA presented on 02/17/2018 with amnesia and vision loss in the right eye.  Imaging with an MRI revealed a stroke.  Neurology was consulted.   Assessment & Plan:   Principal Problem:   Ischemic stroke (HCC) Active Problems:   Hyperlipidemia   TOBACCO ABUSE   Essential hypertension   CAD, NATIVE VESSEL   PSVT (paroxysmal supraventricular tachycardia) (HCC)   PVD (peripheral vascular disease) (HCC)   Elevated troponin   Stenosis of left carotid artery   Carotid occlusion, right   Diabetes mellitus (HCC)   CVA  with h/o CVA in the past -MRI:  Acute large nonhemorrhagic temporal occipital lobe/PCA territory infarct. Additional small acute posterior circulation and posterior watershed infarcts. - CTA head and neck:Left posterior cerebral artery P2 segment occlusion.  - Stable right common carotid and internal carotid artery occlusion  - Calcified plaque of left carotid bifurcation with mild 50% distal CCA stenosis. -Echo showed EF of 60-65% with grade 1 diastolic dysfunction - A1c 4.8 -Continue aspirin, Plavix and Crestor -Neurology following.  Outpatient follow-up with neurology -PT/OT recommending CIR.  Will consult CIR -For probable TEE and loop recorder placement today   Essential hypertension and SVT - allow for permissive HTN- takes Diltiazem, Doxazosin, Hydralazine and Metoprolol at home - watch for SVT and give PRN Metoprolol IV if it occurs -We will restart home antihypertensives probably tomorrow     Elevated troponin - mild elevation with flat trend - no need for further work up    PAD - h/o R CEA and left  Fem-pop bypass graft  CAD  - h/o PCI to LAD   DVT prophylaxis: SCDs Code Status: Full code Family Communication:  None at bedside Disposition Plan:  Probable CIR in 1-2 days   Consultants: Neurology Procedures: Study Conclusions  - Left ventricle: The cavity size was normal. Wall thickness was   increased in a pattern of moderate LVH. Systolic function was   normal. The estimated ejection fraction was in the range of 60%   to 65%. Wall motion was normal; there were no regional wall   motion abnormalities. Doppler parameters are consistent with   abnormal left ventricular relaxation (grade 1 diastolic   dysfunction). - Aortic valve: Functionally bicuspid; severely thickened, severely   calcified leaflets. Valve mobility was restricted. There was   severe stenosis. There was mild regurgitation. Valve area (VTI):   0.81 cm^2. Valve area (Vmax): 0.75 cm^2. Valve area (Vmean): 0.87   cm^2. - Mitral valve: Mild prolapse, involving the anterior leaflet. - Left atrium: The atrium was mildly dilated.  Antimicrobials: None   Subjective: Patient seen and examined at bedside.  He denies overnight fever, nausea or vomiting.  Objective: Vitals:   02/19/18 0300 02/19/18 0814 02/19/18 1116 02/19/18 1323  BP: (!) 153/53 (!) 154/55 (!) 143/50 (!) 168/55  Pulse: 80   75  Resp: 20   13  Temp: 99.4 F (37.4 C) 98 F (36.7 C) 98.4 F (36.9 C) 98.6 F (37 C)  TempSrc: Oral Oral Oral Oral  SpO2: 97%   99%  Weight:      Height:  Intake/Output Summary (Last 24 hours) at 02/19/2018 1417 Last data filed at 02/19/2018 1034 Gross per 24 hour  Intake -  Output 3475 ml  Net -3475 ml   Filed Weights   02/18/18 1610  Weight: 78.3 kg (172 lb 9.9 oz)    Examination:  General exam: Appears calm and comfortable  Respiratory system: Bilateral decreased breath sound at bases Cardiovascular system: S1 & S2 heard, rate controlled  gastrointestinal system: Abdomen is  nondistended, soft and nontender. Normal bowel sounds heard. Extremities: No cyanosis, clubbing, edema      Data Reviewed: I have personally reviewed following labs and imaging studies  CBC: Recent Labs  Lab 02/17/18 1609  WBC 7.5  HGB 12.4*  HCT 36.4*  MCV 101.4*  PLT 285   Basic Metabolic Panel: Recent Labs  Lab 02/17/18 1609 02/18/18 0810  NA 140 138  K 5.2* 3.7  CL 104 103  CO2 26 28  GLUCOSE 86 76  BUN 13 9  CREATININE 1.03 0.86  CALCIUM 9.5 9.0   GFR: Estimated Creatinine Clearance: 92.7 mL/min (by C-G formula based on SCr of 0.86 mg/dL). Liver Function Tests: Recent Labs  Lab 02/17/18 1609  AST 45*  ALT 49  ALKPHOS 81  BILITOT 1.8*  PROT 6.3*  ALBUMIN 3.9   No results for input(s): LIPASE, AMYLASE in the last 168 hours. No results for input(s): AMMONIA in the last 168 hours. Coagulation Profile: No results for input(s): INR, PROTIME in the last 168 hours. Cardiac Enzymes: Recent Labs  Lab 02/18/18 0032 02/18/18 0549 02/18/18 1214 02/18/18 1822  TROPONINI 0.06* 0.06* 0.07* 0.08*   BNP (last 3 results) No results for input(s): PROBNP in the last 8760 hours. HbA1C: Recent Labs    02/18/18 0549  HGBA1C 4.8   CBG: Recent Labs  Lab 02/18/18 1158 02/18/18 1628 02/18/18 2219 02/19/18 0701 02/19/18 1114  GLUCAP 95 132* 73 76 72   Lipid Profile: Recent Labs    02/18/18 0549  CHOL 94  HDL 50  LDLCALC 40  TRIG 19  CHOLHDL 1.9   Thyroid Function Tests: No results for input(s): TSH, T4TOTAL, FREET4, T3FREE, THYROIDAB in the last 72 hours. Anemia Panel: No results for input(s): VITAMINB12, FOLATE, FERRITIN, TIBC, IRON, RETICCTPCT in the last 72 hours. Sepsis Labs: No results for input(s): PROCALCITON, LATICACIDVEN in the last 168 hours.  No results found for this or any previous visit (from the past 240 hour(s)).       Radiology Studies: Ct Angio Head W Or Wo Contrast  Result Date: 02/18/2018 CLINICAL DATA:  67 y/o M;  confusion and stroke. History of left carotid artery stenosis, right carotid artery endarterectomy, carotid occlusion. EXAM: CT ANGIOGRAPHY HEAD AND NECK TECHNIQUE: Multidetector CT imaging of the head and neck was performed using the standard protocol during bolus administration of intravenous contrast. Multiplanar CT image reconstructions and MIPs were obtained to evaluate the vascular anatomy. Carotid stenosis measurements (when applicable) are obtained utilizing NASCET criteria, using the distal internal carotid diameter as the denominator. CONTRAST:  50mL ISOVUE-370 IOPAMIDOL (ISOVUE-370) INJECTION 76% COMPARISON:  02/17/2018 MRI of the head. 03/11/2017 MRA of the head. FINDINGS: CT HEAD FINDINGS Brain: Multiple small areas of hypodensity within the cerebellum and large left PCA area distribution acute infarctions as seen on MRI of the head. No interval hemorrhage identified. Large right and small left chronic parietal infarctions in bilateral chronic ACA distribution infarctions. Stable chronic microvascular ischemic changes and parenchymal volume loss of the brain. No hydrocephalus, extra-axial collection, or  effacement of basilar cisterns. Vascular: As below. Skull: Normal. Negative for fracture or focal lesion. Sinuses: Mild paranasal sinus mucosal thickening greatest in the ethmoid air cells. Normal aeration of mastoid air cells. Orbits are unremarkable. Orbits: No acute finding. Review of the MIP images confirms the above findings CTA NECK FINDINGS Aortic arch: Bovine variant branching. Imaged portion shows no evidence of aneurysm or dissection. No significant stenosis of the major arch vessel origins. Mild calcific atherosclerosis. Right carotid system: Proximal occlusion of right common carotid artery and complete occlusion of right internal carotid artery. Right external carotid branches are patent likely due to retrograde flow and collateralization. Chronic postsurgical changes related to prior right  carotid endarterectomy. Left carotid system: Dense calcification of the carotid bifurcation with mild 50% common carotid stenosis just proximal to the bifurcation. No dissection or occlusion. Vertebral arteries: Codominant. No evidence of dissection, stenosis (50% or greater) or occlusion. 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 OPLL and degenerative changes results in moderate to severe C5-6 canal stenosis. Other neck: Negative. Upper chest: Negative. Review of the MIP images confirms the above findings CTA HEAD FINDINGS Anterior circulation: Occluded right ICA petrous, cavernous, and paraclinoid segments. Right ICA terminal segment is patent. Otherwise no large vessel occlusion, aneurysm, or high-grade stenosis. Calcific atherosclerosis of the left carotid siphon with mild less than 50% stenosis. Posterior circulation: Left P2 occlusion (series 12, image 140). Otherwise no large vessel occlusion, stenosis, or aneurysm in the posterior circulation identified. Venous sinuses: As permitted by contrast timing, patent. Anatomic variants: Complete circle-of-Willis. Delayed phase: No abnormal intracranial enhancement. Review of the MIP images confirms the above findings IMPRESSION: 1. Left posterior cerebral artery P2 segment occlusion. 2. Stable right common carotid and internal carotid artery occlusion to the terminus. Patent right ICA terminus. 3. Otherwise patent anterior and posterior intracranial circulation without large vessel occlusion, aneurysm, stenosis, or vascular malformation. 4. Patent left carotid system. Calcified plaque of left carotid bifurcation with mild 50% distal CCA stenosis. 5. Patent right dominant vertebrobasilar system. No significant stenosis, aneurysm, or dissection. 6. Acute infarcts in cerebellum and left PCA distribution as seen on MRI. No acute hemorrhage. 7. Multiple chronic infarcts, parenchymal volume  loss, and chronic microvascular ischemic changes of the brain as above. 8. Cervical spondylosis and OPLL with moderate to severe C5-6 canal stenosis. These results were called by telephone at the time of interpretation on 02/18/2018 at 12:47 am to Dr. Therisa Doyne , who verbally acknowledged these results. Electronically Signed   By: Mitzi Hansen M.D.   On: 02/18/2018 00:47   Dg Chest 2 View  Result Date: 02/18/2018 CLINICAL DATA:  Status post CVA.  Acute onset of visual disturbance. EXAM: CHEST - 2 VIEW COMPARISON:  With chest radiograph performed 10/17/2017 FINDINGS: The lungs are well-aerated and clear. There is no evidence of focal opacification, pleural effusion or pneumothorax. The heart is normal in size; the mediastinal contour is within normal limits. No acute osseous abnormalities are seen. IMPRESSION: No acute cardiopulmonary process seen. Electronically Signed   By: Roanna Raider M.D.   On: 02/18/2018 00:58   Ct Angio Neck W Or Wo Contrast  Result Date: 02/18/2018 CLINICAL DATA:  67 y/o M; confusion and stroke. History of left carotid artery stenosis, right carotid artery endarterectomy, carotid occlusion. EXAM: CT ANGIOGRAPHY HEAD AND NECK TECHNIQUE: Multidetector CT imaging of the head and neck was performed using the standard protocol during bolus administration of intravenous contrast. Multiplanar CT  image reconstructions and MIPs were obtained to evaluate the vascular anatomy. Carotid stenosis measurements (when applicable) are obtained utilizing NASCET criteria, using the distal internal carotid diameter as the denominator. CONTRAST:  50mL ISOVUE-370 IOPAMIDOL (ISOVUE-370) INJECTION 76% COMPARISON:  02/17/2018 MRI of the head. 03/11/2017 MRA of the head. FINDINGS: CT HEAD FINDINGS Brain: Multiple small areas of hypodensity within the cerebellum and large left PCA area distribution acute infarctions as seen on MRI of the head. No interval hemorrhage identified. Large right  and small left chronic parietal infarctions in bilateral chronic ACA distribution infarctions. Stable chronic microvascular ischemic changes and parenchymal volume loss of the brain. No hydrocephalus, extra-axial collection, or effacement of basilar cisterns. Vascular: As below. Skull: Normal. Negative for fracture or focal lesion. Sinuses: Mild paranasal sinus mucosal thickening greatest in the ethmoid air cells. Normal aeration of mastoid air cells. Orbits are unremarkable. Orbits: No acute finding. Review of the MIP images confirms the above findings CTA NECK FINDINGS Aortic arch: Bovine variant branching. Imaged portion shows no evidence of aneurysm or dissection. No significant stenosis of the major arch vessel origins. Mild calcific atherosclerosis. Right carotid system: Proximal occlusion of right common carotid artery and complete occlusion of right internal carotid artery. Right external carotid branches are patent likely due to retrograde flow and collateralization. Chronic postsurgical changes related to prior right carotid endarterectomy. Left carotid system: Dense calcification of the carotid bifurcation with mild 50% common carotid stenosis just proximal to the bifurcation. No dissection or occlusion. Vertebral arteries: Codominant. No evidence of dissection, stenosis (50% or greater) or occlusion. 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 OPLL and degenerative changes results in moderate to severe C5-6 canal stenosis. Other neck: Negative. Upper chest: Negative. Review of the MIP images confirms the above findings CTA HEAD FINDINGS Anterior circulation: Occluded right ICA petrous, cavernous, and paraclinoid segments. Right ICA terminal segment is patent. Otherwise no large vessel occlusion, aneurysm, or high-grade stenosis. Calcific atherosclerosis of the left carotid siphon with mild less than 50% stenosis. Posterior  circulation: Left P2 occlusion (series 12, image 140). Otherwise no large vessel occlusion, stenosis, or aneurysm in the posterior circulation identified. Venous sinuses: As permitted by contrast timing, patent. Anatomic variants: Complete circle-of-Willis. Delayed phase: No abnormal intracranial enhancement. Review of the MIP images confirms the above findings IMPRESSION: 1. Left posterior cerebral artery P2 segment occlusion. 2. Stable right common carotid and internal carotid artery occlusion to the terminus. Patent right ICA terminus. 3. Otherwise patent anterior and posterior intracranial circulation without large vessel occlusion, aneurysm, stenosis, or vascular malformation. 4. Patent left carotid system. Calcified plaque of left carotid bifurcation with mild 50% distal CCA stenosis. 5. Patent right dominant vertebrobasilar system. No significant stenosis, aneurysm, or dissection. 6. Acute infarcts in cerebellum and left PCA distribution as seen on MRI. No acute hemorrhage. 7. Multiple chronic infarcts, parenchymal volume loss, and chronic microvascular ischemic changes of the brain as above. 8. Cervical spondylosis and OPLL with moderate to severe C5-6 canal stenosis. These results were called by telephone at the time of interpretation on 02/18/2018 at 12:47 am to Dr. Therisa Doyne , who verbally acknowledged these results. Electronically Signed   By: Mitzi Hansen M.D.   On: 02/18/2018 00:47   Mr Brain Wo Contrast  Result Date: 02/17/2018 CLINICAL DATA:  Amnesia today and vision loss. Suspect stroke. History of hypertension, diabetes, status post RIGHT carotid endarterectomy. EXAM: MRI HEAD WITHOUT CONTRAST TECHNIQUE: Multiplanar, multiecho pulse sequences of the  brain and surrounding structures were obtained without intravenous contrast. COMPARISON:  MRI of the head March 11, 2017 FINDINGS: INTRACRANIAL CONTENTS: Confluent reduced diffusion LEFT mesial temporal occipital lobe with low ADC  values. Small sub foci of reduced diffusion bilateral cerebellum and RIGHT occipital lobe. Regional mass effect without midline shift. Punctate focus of reduced diffusion LEFT mesial thalamus, bilateral mesial parietal lobes. No susceptibility artifact to suggest hemorrhage. Mild susceptibility artifact associated with biparietal and bifrontal infarcts. Mild parenchymal brain volume. No abnormal extra-axial loss. No hydrocephalus fluid collections. VASCULAR: Chronically occluded RIGHT internal carotid artery. Remaining major flow voids preserved. SKULL AND UPPER CERVICAL SPINE: No abnormal sellar expansion. No suspicious calvarial bone marrow signal. Craniocervical junction maintained. SINUSES/ORBITS: Moderate paranasal sinus mucosal thickening without air-fluid levels. Mastoid air cells are well aerated.The included ocular globes and orbital contents are non-suspicious. OTHER: None. IMPRESSION: 1. Acute large nonhemorrhagic temporal occipital lobe/PCA territory infarct. Additional small acute posterior circulation and posterior watershed infarcts. 2. Old bifrontal and biparietal lobe infarcts in ACA and MCA territories. 3. Chronically occluded RIGHT internal carotid artery. 4. Critical Value/emergent results were called by telephone at the time of interpretation on 02/17/2018 at 8:25 pm to Dr. Azalia Bilis , who verbally acknowledged these results. Electronically Signed   By: Awilda Metro M.D.   On: 02/17/2018 20:28        Scheduled Meds: . [MAR Hold] aspirin EC  325 mg Oral Daily  . [MAR Hold] clopidogrel  75 mg Oral Q breakfast  . [MAR Hold] insulin aspart  0-5 Units Subcutaneous QHS  . [MAR Hold] insulin aspart  0-9 Units Subcutaneous TID WC  . [MAR Hold] rosuvastatin  10 mg Oral Daily   Continuous Infusions: . sodium chloride 0  (02/19/18 0400)     LOS: 2 days        Glade Lloyd, MD [facility] Pager [phone]  If 7PM-7AM, please contact  night-coverage [email or web address] Password [facility] 02/19/2018, 2:17 PM

## 2018-02-19 NOTE — Progress Notes (Signed)
Physical Therapy Treatment Patient Details Name: Nicholas [name] MRN: [id number] DOB: [birth year] Today's Date: 02/19/2018    History of Present Illness 67 y.o. male admitted for AMS and amnesia. MRI brain shows left large acute PCA infarct. PMH includes CVA, right CEA, HTN, DM, hyperlipidemia, SVT, and CAD.     PT Comments    Patient more alert with eyes opened today. Progressing well. Continues to demonstrate poor short term memory even with information relayed within minutes in session; able to recall things <25% of the time. Also with severe visual deficits in both right/left visual fields. Able to gaze right with max cues but not sustain.  Continues to exhibit poor motor planning, impaired proprioception and imbalance but did tolerate gait training today with mod A for balance/safety. Highly motivated to return to PLOF. Great CIR candidate. Will follow.   Follow Up Recommendations  CIR     Equipment Recommendations  Other (comment)(defer to next venue)    Recommendations for Other Services       Precautions / Restrictions Precautions Precautions: Fall Restrictions Weight Bearing Restrictions: No    Mobility  Bed Mobility Overal bed mobility: Needs Assistance Bed Mobility: Supine to Sit     Supine to sit: Min guard;HOB elevated     General bed mobility comments: use of rails to sit EOB, no assist needed.   Transfers Overall transfer level: Needs assistance Equipment used: None Transfers: Sit to/from Stand Sit to Stand: Min assist         General transfer comment: Min A to steady in standing with wide BoS. Mild right lean. Able to find chair in right visual field today with cues.   Ambulation/Gait Ambulation/Gait assistance: Mod assist Ambulation Distance (Feet): 7 Feet(+4') Assistive device: 1 person hand held assist Gait Pattern/deviations: Step-to pattern;Shuffle;Decreased weight shift to right;Decreased weight shift to left;Wide base of  support;Ataxic;Festinating;Leaning posteriorly Gait velocity: decreased Gait velocity interpretation: Below normal speed for age/gender General Gait Details: Short shuffling steps with cues to increase step length bilaterally; decreased weight shift bil and no foot clearance. Poor proprioception and sequencing. HHA for support and to help with advancing LE with chair follow.   Stairs            Wheelchair Mobility    Modified Rankin (Stroke Patients Only) Modified Rankin (Stroke Patients Only) Pre-Morbid Rankin Score: Slight disability Modified Rankin: Moderately severe disability     Balance Overall balance assessment: Needs assistance Sitting-balance support: Feet supported;No upper extremity supported Sitting balance-Leahy Scale: Fair Sitting balance - Comments: Good static sitting balance.    Standing balance support: During functional activity;Single extremity supported Standing balance-Leahy Scale: Poor Standing balance comment: required external support to maintain dynamic standing                            Cognition Arousal/Alertness: Awake/alert Behavior During Therapy: Flat affect Overall Cognitive Status: Impaired/Different from baseline Area of Impairment: Orientation;Memory;Following commands;Problem solving;Awareness;Safety/judgement                 Orientation Level: Disoriented to;Place;Time;Situation Current Attention Level: Sustained Memory: Decreased recall of precautions;Decreased short-term memory Following Commands: Follows one step commands with increased time Safety/Judgement: Decreased awareness of safety;Decreased awareness of deficits Awareness: Intellectual Problem Solving: Slow processing;Decreased initiation;Difficulty sequencing;Requires verbal cues;Requires tactile cues General Comments: Able to demonstrate some short memory recall within session today inconsistently and with contextual cues about 25% of the time. States  it is "October" each time asked date  even when told earlier the correct month. [facility] being told multiple times during session. Able to gaze right with max cues for short periods but not sustain. Continues to have left/right visual field deficits. Poor motor planning.       Exercises      General Comments General comments (skin integrity, edema, etc.): Continues to have visual impairments in right/left visual fields. Able to find correct number of fingers inconsistently on left side but hard finding anything in right visual field. Able to get gaze to midline today and to right with max cues but not sustain.       Pertinent Vitals/Pain Pain Assessment: No/denies pain    Home Living                      Prior Function            PT Goals (current goals can now be found in the care plan section) Progress towards PT goals: Progressing toward goals    Frequency    Min 4X/week      PT Plan Current plan remains appropriate    Co-evaluation              AM-PAC PT "6 Clicks" Daily Activity  Outcome Measure  Difficulty turning over in bed (including adjusting bedclothes, sheets and blankets)?: None Difficulty moving from lying on back to sitting on the side of the bed? : None Difficulty sitting down on and standing up from a chair with arms (e.g., wheelchair, bedside commode, etc,.)?: A Little Help needed moving to and from a bed to chair (including a wheelchair)?: A Lot Help needed walking in hospital room?: A Lot Help needed climbing 3-5 steps with a railing? : A Lot 6 Click Score: 17    End of Session Equipment Utilized During Treatment: Gait belt Activity Tolerance: Patient tolerated treatment well Patient left: in chair;with call bell/phone within reach;with chair alarm set;Other (comment)(with SLP present in room) Nurse Communication: Mobility status PT Visit Diagnosis: Unsteadiness on feet (R26.81);Other abnormalities of gait and  mobility (R26.89);Muscle weakness (generalized) (M62.81);History of falling (Z91.81)     Time: 1040-1100 PT Time Calculation (min) (ACUTE ONLY): 20 min  Charges:  $Neuromuscular Re-education: 8-22 mins                    G Codes:       Mylo RedShauna Juliya Magill, PT, DPT [phone]     Blake DivineShauna A Kelliann Pendergraph 02/19/2018, 11:48 AM

## 2018-02-19 NOTE — CV Procedure (Signed)
   TEE  Indication: stroke  Time out performed  Anesthesia: Propofol  Findings:   - No embolic source  - SEVERE AORTIC STENOSIS (4.2-5.6045m/s peak velocity with severe thickening and calcification of trileaflet aortic valve)  - Trace MR  - Mild TR  - Normal EF 55%  Donato SchultzMark Takera Rayl, MD

## 2018-02-19 NOTE — Interval H&P Note (Signed)
History and Physical Interval Note:  02/19/2018 1:37 PM  Nicholas [name]  has presented today for surgery, with the diagnosis of stroke  The various methods of treatment have been discussed with the patient and family. After consideration of risks, benefits and other options for treatment, the patient has consented to  Procedure(s): TRANSESOPHAGEAL ECHOCARDIOGRAM (TEE) (N/A) as a surgical intervention .  The patient's history has been reviewed, patient examined, no change in status, stable for surgery.  I have reviewed the patient's chart and labs.  Questions were answered to the patient's satisfaction.     [employer]

## 2018-02-19 NOTE — Consult Note (Addendum)
Cardiology Consult  Admit date: 02/17/2018 Referring Physician  Dr. Glade Lloyd Primary Physician  Dr. [name] Primary Cardiologist  Former patient of Dr. Daleen Squibb Reason for Consultation  Severe AS  HPI: Nicholas [name] is a 67 y.o. male who is being seen today for the evaluation of severe AS noted on TEE done for acute CVA at the request of Dr. Pecola Leisure.  This is a 67 year old African-American male with a history of ASCAD with prior MI status post PCI of the LAD in December 1993 which, per the patient daughter, was complicated by a CVA.  There were no residual effects.  Last cath was in 2004 which showed a 20% left main, multiple 20% lesions in the proximal and mid LAD with widely patent LAD stents, 40% proximal left circumflex and 40% ostial OM1 and a codominant system.  There was a 30% proximal RCA that was small but codominant.  At that time he was referred to EP for consideration of ablation given recurrent atypical atrial flutter.  He also has a history of paroxysmal atrial fibrillation.  He was seen by Dr. Graciela Husbands with EP he has a history of CVA remotely and underwent right carotid atherectomy.    He has a history of PVD and is status post left femoropopliteal bypass surgery in 2004 he has a history of hypertension, hyperlipidemia.  He seem to have been lost to follow-up after his cardiac catheterization in 2004.    He was admitted 04/26/2014 with acute mental status changes and head CT showed acute to  nonhemorrhagic left ACA territory infarct and remote right parietal lobe infarct.  MRI at that time showed an occluded right ICA with reconstitution at the level of PCA.  2D echocardiogram at that time showed mild to moderate aortic stenosis but no source of embolism.  Was noted to have 1-39% stenosis involving the right and left ICA.   He was readmitted 4/4 2018 with leg weakness felt to be due to PVD/CVA and spinal stenosis and uses a walker.  2D echocardiogram at that time revealed a  likely bicuspid aortic valve that was severely thickened and calcified with moderate aortic insufficiency.  Unfortunately the echo was terminated for some reason early and aortic stenosis was not assessed.  He was readmitted 10/22/2017 with a small bowel obstruction and hospitalization was complicated by SVT although no cardiology consult was obtained.    He never had any other 2D echocardiograms until his presentation his hospitalization for acute CVA.  He lives alone and ambulates with a walker according to his daughter.  She says that he is very forgetful but is able to take care of himself.  Apparently on the day of admission his brother found him confused and the patient stated he could not see out of his right eye.  He was found to have an acute large nonhemorrhagic temporal occipital lobe/PCA territory infarct.   He underwent TEE today which showed normal LV function with EF 60-65% with severe aortic valve stenosis and moderate regurgitation.  The peak velocity was 529 cm/s with a mean gradient of 46 mmHg.    Neurology has seen the patient and feels that he likely is having cardioembolic events versus small vessel disease.  He now has aphasia and right homonymous hemianopsia.  The patient denies any chest pain or pressure, shortness of breath, dyspnea on exertion, PND, orthopnea or syncope.  Given his history of spinal stenosis, PVD and prior strokes he has difficulty walking and ambulates with a walker.  He does take care of himself and lives alone at home but his daughter brings in his meals. 1  PMH:   Past Medical History:  Diagnosis Date  . At high risk for falls   . BPH (benign prostatic hyperplasia)   . BPH (benign prostatic hypertrophy)    HX RETENTION  . Carotid artery occlusion    S/P RIGHT CEA  . Coronary artery disease CARDIOLOGIST--  DR Johney Frame   S/P  PCI TO LAD 1993  . GERD (gastroesophageal reflux disease)   . History of CVA (cerebrovascular accident)    1993---  NO RESIDUALS   . History of head injury    CONCUSSION--  NO RESIDUAL  . History of myocardial infarction    1993--  NON-Q WAVE  S/P PCI TO LAD  . Hydrocele, left   . Hyperlipidemia   . Hypertension   . Left carotid artery stenosis    MILD  . Myocardial infarction (HCC)   . PSVT (paroxysmal supraventricular tachycardia) (HCC)   . PVD (peripheral vascular disease) (HCC)    S/P LEFT FEM-POP  . Right leg weakness    USES CANE--  SECONDARY TO PVD  . SBO (small bowel obstruction) (HCC)   . Type 2 diabetes mellitus (HCC)   . Wears glasses      PSH:   Past Surgical History:  Procedure Laterality Date  . CARDIAC CATHETERIZATION  01-18-2003   DR Eden Emms   NON-OBSTRUCTIVE CAD/  PREVIOIS ANGIOPLASTY SITE WIDELY PATENT  . CARDIOVASCULAR STRESS TEST  2007   SMALL ANTERO-APICAL SCAR/  NO ISCHEMIA  . CAROTID ENDARTERECTOMY Right 05-31-2003  . CORONARY ANGIOPLASTY  1993   PCI TO LAD  . FEMORAL-POPLITEAL BYPASS GRAFT Left 01-19-2003  . HYDROCELE EXCISION Left 10/27/2013   Procedure: HYDROCELECTOMY ADULT;  Surgeon: Lindaann Slough, MD;  Location: [facility];  Service: Urology;  Laterality: Left;  . RIGHT HYDROCELECTOMY  05-31-2003  . TRANSTHORACIC ECHOCARDIOGRAM  08-24-2010   EF 55%/  MILD AORTIC INSUFFICENCY/  MODERATE LVH  . UMBILICAL HERNIA REPAIR N/A 10/21/2017   Procedure: UMBILICAL HERNIA REPAIR;  Surgeon: Manus Rudd, MD;  Location: [facility] OR;  Service: General;  Laterality: N/A;    Allergies:  Patient has no known allergies. Prior to Admit Meds:   Medications Prior to Admission  Medication Sig Dispense Refill Last Dose  . digoxin (LANOXIN) 0.25 MG tablet Take 1 tablet (0.25 mg total) by mouth daily. 30 tablet 0 unknown  . diltiazem (TAZTIA XT) 360 MG 24 hr capsule Take 1 capsule (360 mg total) by mouth daily. 30 capsule 0 unknown  . doxazosin (CARDURA) 2 MG tablet Take 1 tablet (2 mg total) by mouth daily. 30 tablet 0 unknown  . hydrALAZINE (APRESOLINE) 10 MG tablet Take 1 tablet  (10 mg total) by mouth every 8 (eight) hours. (Patient taking differently: Take 10 mg by mouth at bedtime. ) 90 tablet 0 unknown  . metoprolol (LOPRESSOR) 50 MG tablet Take 1 tablet (50 mg total) by mouth 2 (two) times daily. 60 tablet 0 few days ago  . rosuvastatin (CRESTOR) 10 MG tablet Take 1 tablet (10 mg total) by mouth daily. 30 tablet 0 unknown  . clopidogrel (PLAVIX) 75 MG tablet Take 1 tablet (75 mg total) by mouth daily with breakfast. (Patient not taking: Reported on 02/17/2018) 30 tablet 0 Not Taking at Unknown time  . mineral oil enema Place 133 mLs (1 enema total) as needed rectally for severe constipation. (Patient not taking: Reported on 02/17/2018) 133 mL  3 Not Taking at Unknown time  . polyethylene glycol (MIRALAX / GLYCOLAX) packet Take 17 g daily as needed by mouth. (Patient not taking: Reported on 02/17/2018) 14 each 0 Not Taking at Unknown time   Fam HX:    Family History  Problem Relation Age of Onset  . Stroke Mother   . Hypertension Mother   . Hypertension Father    Social HX:    Social History   Socioeconomic History  . Marital status: Legally Separated    Spouse name: Not on file  . Number of children: Not on file  . Years of education: Not on file  . Highest education level: Not on file  Social Needs  . Financial resource strain: Not on file  . Food insecurity - worry: Not on file  . Food insecurity - inability: Not on file  . Transportation needs - medical: Not on file  . Transportation needs - non-medical: Not on file  Occupational History  . Not on file  Tobacco Use  . Smoking status: Former Smoker    Years: 0.00    Types: Cigarettes  . Smokeless tobacco: Never Used  . Tobacco comment: very seldom   Substance and Sexual Activity  . Alcohol use: No  . Drug use: No  . Sexual activity: Not on file  Other Topics Concern  . Not on file  Social History Narrative  . Not on file     ROS:  All  ROS were addressed and are negative except what is stated  in the HPI  Physical Exam: Blood pressure (!) 152/48, pulse 65, temperature 98.6 F (37 C), temperature source Oral, resp. rate 14, height 6' (1.829 m), weight 172 lb 9.9 oz (78.3 kg), SpO2 98 %.    General: Well developed, well nourished, in no acute distress Head: Eyes PERRLA, No xanthomas.   Normal cephalic and atramatic  Lungs:   Clear bilaterally to auscultation and percussion. Heart:   HRRR S1 S2 Pulses are 2+ & equal.  There is a loud 3/6 late peaking systolic murmur at the right upper sternal border rating to the left lower sternal border and an early diastolic murmur heard at the left lower sternal border             No carotid bruit. No JVD.  No abdominal bruits. No femoral bruits. Abdomen: Bowel sounds are positive, abdomen soft and non-tender without masses or                  Hernia's noted. Msk:  Back normal, normal gait. Normal strength and tone for age. Extremities:   No clubbing, cyanosis or edema.  DP +1 Neuro: Alert and oriented X 3. Psych:  Good affect, responds appropriately    Labs:   Lab Results  Component Value Date   WBC 7.5 02/17/2018   HGB 12.4 (L) 02/17/2018   HCT 36.4 (L) 02/17/2018   MCV 101.4 (H) 02/17/2018   PLT 285 02/17/2018    Recent Labs  Lab 02/17/18 1609 02/18/18 0810  NA 140 138  K 5.2* 3.7  CL 104 103  CO2 26 28  BUN 13 9  CREATININE 1.03 0.86  CALCIUM 9.5 9.0  PROT 6.3*  --   BILITOT 1.8*  --   ALKPHOS 81  --   ALT 49  --   AST 45*  --   GLUCOSE 86 76   No results found for: PTT Lab Results  Component Value Date   INR 1.13 10/17/2017  INR 1.12 03/10/2017   INR 1.15 10/09/2015   Lab Results  Component Value Date   CKTOTAL 265 (H) 05/07/2014   CKMB 3.1 05/07/2014   TROPONINI 0.08 (HH) 02/18/2018     Lab Results  Component Value Date   CHOL 94 02/18/2018   CHOL 111 03/12/2017   CHOL 112 04/27/2014   Lab Results  Component Value Date   HDL 50 02/18/2018   HDL 42 03/12/2017   HDL 38 (L) 04/27/2014   Lab  Results  Component Value Date   LDLCALC 40 02/18/2018   LDLCALC 57 03/12/2017   LDLCALC 53 04/27/2014   Lab Results  Component Value Date   TRIG 19 02/18/2018   TRIG 61 03/12/2017   TRIG 103 04/27/2014   Lab Results  Component Value Date   CHOLHDL 1.9 02/18/2018   CHOLHDL 2.6 03/12/2017   CHOLHDL 2.9 04/27/2014   No results found for: LDLDIRECT    Radiology:  Ct Angio Head W Or Wo Contrast  Result Date: 02/18/2018 CLINICAL DATA:  67 y/o M; confusion and stroke. History of left carotid artery stenosis, right carotid artery endarterectomy, carotid occlusion. EXAM: CT ANGIOGRAPHY HEAD AND NECK TECHNIQUE: Multidetector CT imaging of the head and neck was performed using the standard protocol during bolus administration of intravenous contrast. Multiplanar CT image reconstructions and MIPs were obtained to evaluate the vascular anatomy. Carotid stenosis measurements (when applicable) are obtained utilizing NASCET criteria, using the distal internal carotid diameter as the denominator. CONTRAST:  50mL ISOVUE-370 IOPAMIDOL (ISOVUE-370) INJECTION 76% COMPARISON:  02/17/2018 MRI of the head. 03/11/2017 MRA of the head. FINDINGS: CT HEAD FINDINGS Brain: Multiple small areas of hypodensity within the cerebellum and large left PCA area distribution acute infarctions as seen on MRI of the head. No interval hemorrhage identified. Large right and small left chronic parietal infarctions in bilateral chronic ACA distribution infarctions. Stable chronic microvascular ischemic changes and parenchymal volume loss of the brain. No hydrocephalus, extra-axial collection, or effacement of basilar cisterns. Vascular: As below. Skull: Normal. Negative for fracture or focal lesion. Sinuses: Mild paranasal sinus mucosal thickening greatest in the ethmoid air cells. Normal aeration of mastoid air cells. Orbits are unremarkable. Orbits: No acute finding. Review of the MIP images confirms the above findings CTA NECK FINDINGS  Aortic arch: Bovine variant branching. Imaged portion shows no evidence of aneurysm or dissection. No significant stenosis of the major arch vessel origins. Mild calcific atherosclerosis. Right carotid system: Proximal occlusion of right common carotid artery and complete occlusion of right internal carotid artery. Right external carotid branches are patent likely due to retrograde flow and collateralization. Chronic postsurgical changes related to prior right carotid endarterectomy. Left carotid system: Dense calcification of the carotid bifurcation with mild 50% common carotid stenosis just proximal to the bifurcation. No dissection or occlusion. Vertebral arteries: Codominant. No evidence of dissection, stenosis (50% or greater) or occlusion. 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 OPLL and degenerative changes results in moderate to severe C5-6 canal stenosis. Other neck: Negative. Upper chest: Negative. Review of the MIP images confirms the above findings CTA HEAD FINDINGS Anterior circulation: Occluded right ICA petrous, cavernous, and paraclinoid segments. Right ICA terminal segment is patent. Otherwise no large vessel occlusion, aneurysm, or high-grade stenosis. Calcific atherosclerosis of the left carotid siphon with mild less than 50% stenosis. Posterior circulation: Left P2 occlusion (series 12, image 140). Otherwise no large vessel occlusion, stenosis, or aneurysm in the posterior circulation identified. Venous  sinuses: As permitted by contrast timing, patent. Anatomic variants: Complete circle-of-Willis. Delayed phase: No abnormal intracranial enhancement. Review of the MIP images confirms the above findings IMPRESSION: 1. Left posterior cerebral artery P2 segment occlusion. 2. Stable right common carotid and internal carotid artery occlusion to the terminus. Patent right ICA terminus. 3. Otherwise patent anterior and  posterior intracranial circulation without large vessel occlusion, aneurysm, stenosis, or vascular malformation. 4. Patent left carotid system. Calcified plaque of left carotid bifurcation with mild 50% distal CCA stenosis. 5. Patent right dominant vertebrobasilar system. No significant stenosis, aneurysm, or dissection. 6. Acute infarcts in cerebellum and left PCA distribution as seen on MRI. No acute hemorrhage. 7. Multiple chronic infarcts, parenchymal volume loss, and chronic microvascular ischemic changes of the brain as above. 8. Cervical spondylosis and OPLL with moderate to severe C5-6 canal stenosis. These results were called by telephone at the time of interpretation on 02/18/2018 at 12:47 am to Dr. Therisa Doyne , who verbally acknowledged these results. Electronically Signed   By: Mitzi Hansen M.D.   On: 02/18/2018 00:47   Dg Chest 2 View  Result Date: 02/18/2018 CLINICAL DATA:  Status post CVA.  Acute onset of visual disturbance. EXAM: CHEST - 2 VIEW COMPARISON:  With chest radiograph performed 10/17/2017 FINDINGS: The lungs are well-aerated and clear. There is no evidence of focal opacification, pleural effusion or pneumothorax. The heart is normal in size; the mediastinal contour is within normal limits. No acute osseous abnormalities are seen. IMPRESSION: No acute cardiopulmonary process seen. Electronically Signed   By: Roanna Raider M.D.   On: 02/18/2018 00:58   Ct Angio Neck W Or Wo Contrast  Result Date: 02/18/2018 CLINICAL DATA:  67 y/o M; confusion and stroke. History of left carotid artery stenosis, right carotid artery endarterectomy, carotid occlusion. EXAM: CT ANGIOGRAPHY HEAD AND NECK TECHNIQUE: Multidetector CT imaging of the head and neck was performed using the standard protocol during bolus administration of intravenous contrast. Multiplanar CT image reconstructions and MIPs were obtained to evaluate the vascular anatomy. Carotid stenosis measurements (when  applicable) are obtained utilizing NASCET criteria, using the distal internal carotid diameter as the denominator. CONTRAST:  50mL ISOVUE-370 IOPAMIDOL (ISOVUE-370) INJECTION 76% COMPARISON:  02/17/2018 MRI of the head. 03/11/2017 MRA of the head. FINDINGS: CT HEAD FINDINGS Brain: Multiple small areas of hypodensity within the cerebellum and large left PCA area distribution acute infarctions as seen on MRI of the head. No interval hemorrhage identified. Large right and small left chronic parietal infarctions in bilateral chronic ACA distribution infarctions. Stable chronic microvascular ischemic changes and parenchymal volume loss of the brain. No hydrocephalus, extra-axial collection, or effacement of basilar cisterns. Vascular: As below. Skull: Normal. Negative for fracture or focal lesion. Sinuses: Mild paranasal sinus mucosal thickening greatest in the ethmoid air cells. Normal aeration of mastoid air cells. Orbits are unremarkable. Orbits: No acute finding. Review of the MIP images confirms the above findings CTA NECK FINDINGS Aortic arch: Bovine variant branching. Imaged portion shows no evidence of aneurysm or dissection. No significant stenosis of the major arch vessel origins. Mild calcific atherosclerosis. Right carotid system: Proximal occlusion of right common carotid artery and complete occlusion of right internal carotid artery. Right external carotid branches are patent likely due to retrograde flow and collateralization. Chronic postsurgical changes related to prior right carotid endarterectomy. Left carotid system: Dense calcification of the carotid bifurcation with mild 50% common carotid stenosis just proximal to the bifurcation. No dissection or occlusion. Vertebral arteries: Codominant. No evidence of  dissection, stenosis (50% or greater) or occlusion. 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 OPLL and degenerative  changes results in moderate to severe C5-6 canal stenosis. Other neck: Negative. Upper chest: Negative. Review of the MIP images confirms the above findings CTA HEAD FINDINGS Anterior circulation: Occluded right ICA petrous, cavernous, and paraclinoid segments. Right ICA terminal segment is patent. Otherwise no large vessel occlusion, aneurysm, or high-grade stenosis. Calcific atherosclerosis of the left carotid siphon with mild less than 50% stenosis. Posterior circulation: Left P2 occlusion (series 12, image 140). Otherwise no large vessel occlusion, stenosis, or aneurysm in the posterior circulation identified. Venous sinuses: As permitted by contrast timing, patent. Anatomic variants: Complete circle-of-Willis. Delayed phase: No abnormal intracranial enhancement. Review of the MIP images confirms the above findings IMPRESSION: 1. Left posterior cerebral artery P2 segment occlusion. 2. Stable right common carotid and internal carotid artery occlusion to the terminus. Patent right ICA terminus. 3. Otherwise patent anterior and posterior intracranial circulation without large vessel occlusion, aneurysm, stenosis, or vascular malformation. 4. Patent left carotid system. Calcified plaque of left carotid bifurcation with mild 50% distal CCA stenosis. 5. Patent right dominant vertebrobasilar system. No significant stenosis, aneurysm, or dissection. 6. Acute infarcts in cerebellum and left PCA distribution as seen on MRI. No acute hemorrhage. 7. Multiple chronic infarcts, parenchymal volume loss, and chronic microvascular ischemic changes of the brain as above. 8. Cervical spondylosis and OPLL with moderate to severe C5-6 canal stenosis. These results were called by telephone at the time of interpretation on 02/18/2018 at 12:47 am to Dr. Therisa Doyne , who verbally acknowledged these results. Electronically Signed   By: Mitzi Hansen M.D.   On: 02/18/2018 00:47   Mr Brain Wo Contrast  Result Date:  02/17/2018 CLINICAL DATA:  Amnesia today and vision loss. Suspect stroke. History of hypertension, diabetes, status post RIGHT carotid endarterectomy. EXAM: MRI HEAD WITHOUT CONTRAST TECHNIQUE: Multiplanar, multiecho pulse sequences of the brain and surrounding structures were obtained without intravenous contrast. COMPARISON:  MRI of the head March 11, 2017 FINDINGS: INTRACRANIAL CONTENTS: Confluent reduced diffusion LEFT mesial temporal occipital lobe with low ADC values. Small sub foci of reduced diffusion bilateral cerebellum and RIGHT occipital lobe. Regional mass effect without midline shift. Punctate focus of reduced diffusion LEFT mesial thalamus, bilateral mesial parietal lobes. No susceptibility artifact to suggest hemorrhage. Mild susceptibility artifact associated with biparietal and bifrontal infarcts. Mild parenchymal brain volume. No abnormal extra-axial loss. No hydrocephalus fluid collections. VASCULAR: Chronically occluded RIGHT internal carotid artery. Remaining major flow voids preserved. SKULL AND UPPER CERVICAL SPINE: No abnormal sellar expansion. No suspicious calvarial bone marrow signal. Craniocervical junction maintained. SINUSES/ORBITS: Moderate paranasal sinus mucosal thickening without air-fluid levels. Mastoid air cells are well aerated.The included ocular globes and orbital contents are non-suspicious. OTHER: None. IMPRESSION: 1. Acute large nonhemorrhagic temporal occipital lobe/PCA territory infarct. Additional small acute posterior circulation and posterior watershed infarcts. 2. Old bifrontal and biparietal lobe infarcts in ACA and MCA territories. 3. Chronically occluded RIGHT internal carotid artery. 4. Critical Value/emergent results were called by telephone at the time of interpretation on 02/17/2018 at 8:25 pm to Dr. Azalia Bilis , who verbally acknowledged these results. Electronically Signed   By: Awilda Metro M.D.   On: 02/17/2018 20:28     Telemetry    NSR -  Personally Reviewed  ECG    Normal sinus rhythm with occasional PVC, septal infarct and left atrial enlargement - Personally Reviewed   ASSESSMENT/PLAN:  1.  Severe aortic stenosis on TEE  -Review of 2D echocardiograms in the past have shown a bicuspid valve initially with mild to moderate aortic stenosis in 2015 but clearly this has progressed to severe aortic stenosis. -Peak aortic valve velocity is 529 cm/s with a mean aortic valve gradient of 49 mmHg and a calculated aortic valve area by VTI of 0.81 cm -There is also evidence of moderate aortic insufficiency with pressure half-time 373 ms -Management of this patient with severe aortic stenosis in the setting of multiple CVAs in the past and now another large acute CVA is extremely difficult.  First of all he is asymptomatic from his aortic stenosis at this time but is very sedentary at home and also has cognitive issues including memory problems so unsure his history is adequate. -I suspect that the risk of CVA would be similar between a TAVR as well as open AVR, but I am very concerned about the patient's ability to go through successful rehabilitation after an open chest procedure and therefore TAVR may be a better option once he has recovered from his CVA. - Clearly his risk of recurrent CVA is high with either cardiac procedure  given his underlying comorbidities.  -I have spoken to Dr. Excell Seltzerooper and feel that he would best benefit from a multidisciplinary evaluation by the structural heart team. -I think he needs to recover from his acute CVA and then be further evaluated -Dr. Excell Seltzerooper will arrange follow-up in his office once the patient has recovered from his CVA and progressed through rehab  2.  Acute CVA -This is a very difficult situation given that the patient has had several CVAs in the past.  It is been unclear in the past the etiology of these and is felt to be either cardioembolic or small vessel disease given that he has had  evidence of cerebrovascular disease on prior MRI as well as carotid disease.  - He is also, though, had atrial arrhythmias documented in the past with mention in notes dating clear back to 2004 that he had runs of atypical atrial flutter as well as atrial fibrillation and he never followed up with the EP. -His CHADS2VASC score is 6 -I feel there are 2 ways to approach this, either go ahead and start anticoagulation with Eliquis when okay with neuro versus place a loop recorder to evaluate for silent atrial fibrillation.  I am  concerned that there was apparently atrial fibrillation documented in  past notes on H&P's but I cannot actually find any EKG showing atrial fibrillation.   -He has been on Plavix prior to admission and now with recurrent stroke would consider proceeding with NOAC therapy when cleared by neuro  -continue statin therapy -We will continue to follow on telemetry for arrhythmias  3.  History of SVT with some notes dated back as far as 2004 showing paroxysmal atypical atrial flutter and atrial fibrillation. -The patient was supposed to follow-up with EP but never did.  -His home medications include digoxin, Cardizem and metoprolol which I suspect are for prior SVT and atrial arrhythmias. -In setting of normal LV function I would stop his digoxin and continue Cardizem and metoprolol.    4.  Hypertension -Blood pressure well controlled currently. -Home BP medicines include Lopressor, hydralazine, Cardura, Cardizem  -blood pressure management per neurology  5  Hyperlipidemia with LDL goal less than 70. -Continue statin  -His LDL is at goal at 40and HDL 50  6.  ASCAD -status post remote MI in  1993 with PCI of the LAD at that time -Last cardiac cath in 2004 showed 20% left main, multiple 20% lesions in the proximal and mid LAD with widely patent LAD stents, 40% proximal left circumflex and 40% ostial OM1 and a codominant system.  There was a 30% proximal RCA that was small but  codominant.  -He is not had any anginal symptoms although he is fairly sedentary but does use a walker to ambulate throughout his house -He is on Plavix, statin and beta-blocker.  I have spent a total of 90 minutes with patient reviewing hospital notes dating back to 2004, prior cardiac caths, prior echoes dating back to 2015, prior admissions for CVAs, TEE , telemetry, EKGs, labs and examining patient as well as establishing an assessment and plan that was discussed with the patient.  > 50% of time was spent in direct patient care.    Armanda Magic, MD  02/19/2018  5:50 PM

## 2018-02-19 NOTE — Progress Notes (Signed)
  Speech Language Pathology Treatment: Cognitive-Linquistic  Patient Details Name: Nicholas [name] MRN: [id number] DOB: 01/24/1951 Today's Date: 02/19/2018 Time: [id number] SLP Time Calculation (min) (ACUTE ONLY): 10 min  Assessment / Plan / Recommendation Clinical Impression  Pt was seen for skilled ST targeting cognitive goals.  Pt was received in recliner from PT therapy session.  He was more alert in comparison to yesterday's evaluation but still disoriented to date and situation.  Pt had improved but inconsistent visual scanning to the right of midline and could identify certain things on his right such as whether the blinds to his room were open or closed or whether it was a sunny day outside.  Visual acuity also appears diminished on the left as well.  Pt needed max to total assist to identify current date on a simple calendar.  Pt reports he can not read at baseline but could make out some words and numbers.  Pt could return demonstration of use of call bell with max assist multimodal cues.  Pt was left in recliner with chair alarm set and call bell within reach.  Continue per current plan of care.    HPI HPI: Nicholas FullingWillie J Allebach is a 67 y/o with HTN, HLD, SVT, CAD with PCI to LAD , PAD s/p left fem-pop bypass and right CEA, CVA  who present with amnesia and vision loss in the right eye. Imaging with an MRI revealed a stroke.      SLP Plan  Continue with current plan of care       Recommendations                   Follow up Recommendations: 24 hour supervision/assistance;Skilled Nursing facility SLP Visit Diagnosis: Cognitive communication deficit (Y78.295(R41.841) Plan: Continue with current plan of care       GO                Rasheda Ledger, Melanee Spryicole L 02/19/2018, 11:25 AM

## 2018-02-19 NOTE — Anesthesia Preprocedure Evaluation (Addendum)
Anesthesia Evaluation  Patient identified by MRN, date of birth, ID band Patient awake    Reviewed: Allergy & Precautions, H&P , NPO status , Patient's Chart, lab work & pertinent test results  Airway Mallampati: II  TM Distance: >3 FB Neck ROM: Full    Dental no notable dental hx. (+) Teeth Intact, Dental Advisory Given   Pulmonary neg pulmonary ROS, former smoker,    Pulmonary exam normal breath sounds clear to auscultation       Cardiovascular hypertension, Pt. on medications and Pt. on home beta blockers + CAD, + Past MI and + Peripheral Vascular Disease  + Valvular Problems/Murmurs AS  Rhythm:Regular Rate:Normal     Neuro/Psych CVA, Residual Symptoms negative psych ROS   GI/Hepatic negative GI ROS, Neg liver ROS,   Endo/Other  diabetes  Renal/GU negative Renal ROS  negative genitourinary   Musculoskeletal   Abdominal   Peds  Hematology negative hematology ROS (+)   Anesthesia Other Findings   Reproductive/Obstetrics negative OB ROS                            Anesthesia Physical Anesthesia Plan  ASA: IV  Anesthesia Plan: MAC   Post-op Pain Management:    Induction: Intravenous  PONV Risk Score and Plan: 1 and Propofol infusion  Airway Management Planned: Nasal Cannula  Additional Equipment:   Intra-op Plan:   Post-operative Plan:   Informed Consent: I have reviewed the patients History and Physical, chart, labs and discussed the procedure including the risks, benefits and alternatives for the proposed anesthesia with the patient or authorized representative who has indicated his/her understanding and acceptance.   Dental advisory given  Plan Discussed with: CRNA  Anesthesia Plan Comments:         Anesthesia Quick Evaluation

## 2018-02-19 NOTE — Transfer of Care (Signed)
Immediate Anesthesia Transfer of Care Note  Patient: Nicholas [name]  Procedure(s) Performed: TRANSESOPHAGEAL ECHOCARDIOGRAM (TEE) (N/A )  Patient Location: Endoscopy Unit  Anesthesia Type:MAC  Level of Consciousness: awake, alert  and oriented  Airway & Oxygen Therapy: Patient Spontanous Breathing and Patient connected to nasal cannula oxygen  Post-op Assessment: Report given to RN and Post -op Vital signs reviewed and stable  Post vital signs: Reviewed and stable  Last Vitals:  Vitals:   02/19/18 1116 02/19/18 1323  BP: (!) 143/50 (!) 168/55  Pulse:  75  Resp:  13  Temp: 36.9 C 37 C  SpO2:  99%    Last Pain:  Vitals:   02/19/18 1323  TempSrc: Oral  PainSc:          Complications: No apparent anesthesia complications

## 2018-02-20 ENCOUNTER — Other Ambulatory Visit: Payer: Self-pay

## 2018-02-20 ENCOUNTER — Inpatient Hospital Stay (HOSPITAL_COMMUNITY)
Admission: RE | Admit: 2018-02-20 | Discharge: 2018-03-07 | DRG: 057 | Disposition: A | Discharge status: Skilled Nursing Facility | Payer: Medicare Other | Source: Intra-hospital | Attending: Physical Medicine & Rehabilitation | Admitting: Physical Medicine & Rehabilitation

## 2018-02-20 ENCOUNTER — Encounter (HOSPITAL_COMMUNITY): Payer: Self-pay | Admitting: *Deleted

## 2018-02-20 ENCOUNTER — Other Ambulatory Visit: Payer: Self-pay | Admitting: Cardiology

## 2018-02-20 DIAGNOSIS — I639 Cerebral infarction, unspecified: Secondary | ICD-10-CM | POA: Diagnosis present

## 2018-02-20 DIAGNOSIS — R339 Retention of urine, unspecified: Secondary | ICD-10-CM | POA: Diagnosis present

## 2018-02-20 DIAGNOSIS — I2510 Atherosclerotic heart disease of native coronary artery without angina pectoris: Secondary | ICD-10-CM | POA: Diagnosis present

## 2018-02-20 DIAGNOSIS — R32 Unspecified urinary incontinence: Secondary | ICD-10-CM | POA: Diagnosis not present

## 2018-02-20 DIAGNOSIS — Z87891 Personal history of nicotine dependence: Secondary | ICD-10-CM | POA: Diagnosis not present

## 2018-02-20 DIAGNOSIS — K5900 Constipation, unspecified: Secondary | ICD-10-CM | POA: Diagnosis present

## 2018-02-20 DIAGNOSIS — Z9861 Coronary angioplasty status: Secondary | ICD-10-CM | POA: Diagnosis not present

## 2018-02-20 DIAGNOSIS — Z8249 Family history of ischemic heart disease and other diseases of the circulatory system: Secondary | ICD-10-CM | POA: Diagnosis not present

## 2018-02-20 DIAGNOSIS — I739 Peripheral vascular disease, unspecified: Secondary | ICD-10-CM

## 2018-02-20 DIAGNOSIS — I6622 Occlusion and stenosis of left posterior cerebral artery: Secondary | ICD-10-CM | POA: Diagnosis not present

## 2018-02-20 DIAGNOSIS — E785 Hyperlipidemia, unspecified: Secondary | ICD-10-CM | POA: Diagnosis present

## 2018-02-20 DIAGNOSIS — I350 Nonrheumatic aortic (valve) stenosis: Secondary | ICD-10-CM | POA: Diagnosis present

## 2018-02-20 DIAGNOSIS — I69351 Hemiplegia and hemiparesis following cerebral infarction affecting right dominant side: Principal | ICD-10-CM

## 2018-02-20 DIAGNOSIS — I4891 Unspecified atrial fibrillation: Secondary | ICD-10-CM

## 2018-02-20 DIAGNOSIS — H5340 Unspecified visual field defects: Secondary | ICD-10-CM | POA: Diagnosis present

## 2018-02-20 DIAGNOSIS — I471 Supraventricular tachycardia: Secondary | ICD-10-CM | POA: Diagnosis present

## 2018-02-20 DIAGNOSIS — H53461 Homonymous bilateral field defects, right side: Secondary | ICD-10-CM

## 2018-02-20 DIAGNOSIS — M21372 Foot drop, left foot: Secondary | ICD-10-CM | POA: Diagnosis present

## 2018-02-20 DIAGNOSIS — Z79899 Other long term (current) drug therapy: Secondary | ICD-10-CM

## 2018-02-20 DIAGNOSIS — R269 Unspecified abnormalities of gait and mobility: Secondary | ICD-10-CM

## 2018-02-20 DIAGNOSIS — I69398 Other sequelae of cerebral infarction: Secondary | ICD-10-CM

## 2018-02-20 DIAGNOSIS — I252 Old myocardial infarction: Secondary | ICD-10-CM

## 2018-02-20 DIAGNOSIS — E1151 Type 2 diabetes mellitus with diabetic peripheral angiopathy without gangrene: Secondary | ICD-10-CM | POA: Diagnosis present

## 2018-02-20 DIAGNOSIS — Z7902 Long term (current) use of antithrombotics/antiplatelets: Secondary | ICD-10-CM

## 2018-02-20 DIAGNOSIS — R4189 Other symptoms and signs involving cognitive functions and awareness: Secondary | ICD-10-CM | POA: Diagnosis present

## 2018-02-20 LAB — CBC WITH DIFFERENTIAL/PLATELET
BASOS ABS: 0 10*3/uL (ref 0.0–0.1)
Basophils Relative: 0 %
Eosinophils Absolute: 0.1 10*3/uL (ref 0.0–0.7)
Eosinophils Relative: 1 %
HEMATOCRIT: 34.9 % — AB (ref 39.0–52.0)
HEMOGLOBIN: 12 g/dL — AB (ref 13.0–17.0)
Lymphocytes Relative: 10 %
Lymphs Abs: 1.3 10*3/uL (ref 0.7–4.0)
MCH: 34.6 pg — ABNORMAL HIGH (ref 26.0–34.0)
MCHC: 34.4 g/dL (ref 30.0–36.0)
MCV: 100.6 fL — ABNORMAL HIGH (ref 78.0–100.0)
MONO ABS: 1.7 10*3/uL — AB (ref 0.1–1.0)
Monocytes Relative: 13 %
NEUTROS ABS: 10.2 10*3/uL — AB (ref 1.7–7.7)
NEUTROS PCT: 76 %
Platelets: 265 10*3/uL (ref 150–400)
RBC: 3.47 MIL/uL — ABNORMAL LOW (ref 4.22–5.81)
RDW: 12.3 % (ref 11.5–15.5)
WBC: 13.4 10*3/uL — ABNORMAL HIGH (ref 4.0–10.5)

## 2018-02-20 LAB — BASIC METABOLIC PANEL
ANION GAP: 7 (ref 5–15)
BUN: 11 mg/dL (ref 6–20)
CHLORIDE: 101 mmol/L (ref 101–111)
CO2: 29 mmol/L (ref 22–32)
Calcium: 8.7 mg/dL — ABNORMAL LOW (ref 8.9–10.3)
Creatinine, Ser: 1.03 mg/dL (ref 0.61–1.24)
GFR calc non Af Amer: >60 mL/min (ref >60)
GLUCOSE: 81 mg/dL (ref 65–99)
Potassium: 3.8 mmol/L (ref 3.5–5.1)
Sodium: 137 mmol/L (ref 135–145)

## 2018-02-20 LAB — GLUCOSE, CAPILLARY
GLUCOSE-CAPILLARY: 74 mg/dL (ref 65–99)
GLUCOSE-CAPILLARY: 120 mg/dL — AB (ref 65–99)
GLUCOSE-CAPILLARY: 78 mg/dL (ref 65–99)
GLUCOSE-CAPILLARY: 84 mg/dL (ref 65–99)
Glucose-Capillary: 76 mg/dL (ref 65–99)

## 2018-02-20 LAB — MAGNESIUM: Magnesium: 1.9 mg/dL (ref 1.7–2.4)

## 2018-02-20 MED ORDER — ACETAMINOPHEN 160 MG/5ML PO SOLN
650.0000 mg | ORAL | Status: DC | PRN
  Filled 2018-02-20: qty 20.3

## 2018-02-20 MED ORDER — ROSUVASTATIN CALCIUM 10 MG PO TABS
10.0000 mg | ORAL_TABLET | Freq: Every day | ORAL | Status: DC
  Administered 2018-02-21 – 2018-03-07 (×15): 10 mg via ORAL
  Filled 2018-02-20 (×15): qty 1

## 2018-02-20 MED ORDER — ONDANSETRON HCL 4 MG/2ML IJ SOLN
4.0000 mg | Freq: Four times a day (QID) | INTRAMUSCULAR | Status: DC | PRN
  Filled 2018-02-20: qty 2

## 2018-02-20 MED ORDER — APIXABAN 5 MG PO TABS
5.0000 mg | ORAL_TABLET | Freq: Two times a day (BID) | ORAL | Status: DC
  Administered 2018-02-23 – 2018-03-07 (×25): 5 mg via ORAL
  Filled 2018-02-20 (×25): qty 1

## 2018-02-20 MED ORDER — CLOPIDOGREL BISULFATE 75 MG PO TABS: 75.0000 mg | ORAL_TABLET | Freq: Every day | ORAL | Status: DC

## 2018-02-20 MED ORDER — APIXABAN 5 MG PO TABS: 5.0000 mg | ORAL_TABLET | Freq: Two times a day (BID) | ORAL | Status: DC

## 2018-02-20 MED ORDER — INSULIN ASPART 100 UNIT/ML ~~LOC~~ SOLN: 0.0000 [IU] | Freq: Every day | SUBCUTANEOUS | Status: DC

## 2018-02-20 MED ORDER — CLOPIDOGREL BISULFATE 75 MG PO TABS
75.0000 mg | ORAL_TABLET | Freq: Every day | ORAL | Status: DC
  Filled 2018-02-20: qty 1

## 2018-02-20 MED ORDER — ACETAMINOPHEN 325 MG PO TABS
650.0000 mg | ORAL_TABLET | ORAL | Status: DC | PRN
  Administered 2018-02-26 – 2018-03-03 (×2): 650 mg via ORAL
  Filled 2018-02-20 (×2): qty 2

## 2018-02-20 MED ORDER — SENNOSIDES-DOCUSATE SODIUM 8.6-50 MG PO TABS
1.0000 | ORAL_TABLET | Freq: Every evening | ORAL | Status: DC | PRN
  Administered 2018-02-23: 1 via ORAL
  Filled 2018-02-20: qty 1

## 2018-02-20 MED ORDER — ASPIRIN EC 325 MG PO TBEC
325.0000 mg | DELAYED_RELEASE_TABLET | Freq: Every day | ORAL | Status: AC
  Administered 2018-02-21 – 2018-02-22 (×2): 325 mg via ORAL
  Filled 2018-02-20 (×2): qty 1

## 2018-02-20 MED ORDER — ACETAMINOPHEN 650 MG RE SUPP: 650.0000 mg | RECTAL | Status: DC | PRN

## 2018-02-20 MED ORDER — METOPROLOL TARTRATE 5 MG/5ML IV SOLN
5.0000 mg | INTRAVENOUS | Status: DC | PRN
  Filled 2018-02-20: qty 5

## 2018-02-20 MED ORDER — SENNOSIDES-DOCUSATE SODIUM 8.6-50 MG PO TABS: 1.0000 | ORAL_TABLET | Freq: Every evening | ORAL | Status: DC | PRN

## 2018-02-20 MED ORDER — SORBITOL 70 % SOLN
30.0000 mL | Freq: Every day | Status: DC | PRN
  Administered 2018-02-23 – 2018-02-24 (×2): 30 mL via ORAL
  Filled 2018-02-20 (×2): qty 30

## 2018-02-20 MED ORDER — ASPIRIN 325 MG PO TBEC: 325.0000 mg | DELAYED_RELEASE_TABLET | Freq: Every day | ORAL | Status: DC

## 2018-02-20 MED ORDER — ONDANSETRON HCL 4 MG PO TABS: 4.0000 mg | ORAL_TABLET | Freq: Four times a day (QID) | ORAL | Status: DC | PRN

## 2018-02-20 NOTE — Progress Notes (Addendum)
Progress Note  Patient Name: Nicholas [name] Date of Encounter: 02/20/2018  Primary Cardiologist: No primary care provider on file.   Subjective   No new complaints today denies chest pain or shortness of breath..    Inpatient Medications    Scheduled Meds: . aspirin EC  325 mg Oral Daily  . clopidogrel  75 mg Oral Q breakfast  . insulin aspart  0-5 Units Subcutaneous QHS  . insulin aspart  0-9 Units Subcutaneous TID WC  . rosuvastatin  10 mg Oral Daily   Continuous Infusions: . sodium chloride Stopped (02/20/18 0730)   PRN Meds: acetaminophen **OR** acetaminophen (TYLENOL) oral liquid 160 mg/5 mL **OR** acetaminophen, metoprolol tartrate, senna-docusate   Vital Signs    Vitals:   02/20/18 0001 02/20/18 0435 02/20/18 0739 02/20/18 0800  BP: (!) 140/50 (!) 143/44 (!) 115/52   Pulse: 73 74  74  Resp: 18 18  19   Temp: 98.5 F (36.9 C) 98.6 F (37 C) (!) 97.4 F (36.3 C)   TempSrc: Oral Oral Oral   SpO2: 98% 99%  98%  Weight:      Height:        Intake/Output Summary (Last 24 hours) at 02/20/2018 1034 Last data filed at 02/20/2018 9562 Gross per 24 hour  Intake 885 ml  Output 800 ml  Net 85 ml   Filed Weights   02/18/18 0642  Weight: 172 lb 9.9 oz (78.3 kg)    Telemetry    Normal sinus rhythm- Personally Reviewed  ECG    No new EKG to review- Personally Reviewed  Physical Exam   GEN: No acute distress.   Neck: No JVD Cardiac: RRR, no  rubs or gallops. 3/6 late peaking SB and RUSB and LLSB Respiratory: Clear to auscultation bilaterally. GI: Soft, nontender, non-distended  MS: No edema; No deformity. Neuro:  Nonfocal  Psych: Normal affect   Labs    Chemistry Recent Labs  Lab 02/17/18 1609 02/18/18 0810 02/20/18 0428  NA 140 138 137  K 5.2* 3.7 3.8  CL 104 103 101  CO2 26 28 29   GLUCOSE 86 76 81  BUN 13 9 11   CREATININE 1.03 0.86 1.03  CALCIUM 9.5 9.0 8.7*  PROT 6.3*  --   --   ALBUMIN 3.9  --   --   AST 45*  --   --   ALT 49   --   --   ALKPHOS 81  --   --   BILITOT 1.8*  --   --   GFRNONAA >60 >60 >60  GFRAA >60 >60 >60  ANIONGAP 10 7 7      Hematology Recent Labs  Lab 02/17/18 1609 02/20/18 0428  WBC 7.5 13.4*  RBC 3.59* 3.47*  HGB 12.4* 12.0*  HCT 36.4* 34.9*  MCV 101.4* 100.6*  MCH 34.5* 34.6*  MCHC 34.1 34.4  RDW 12.5 12.3  PLT 285 265    Cardiac Enzymes Recent Labs  Lab 02/18/18 0032 02/18/18 0549 02/18/18 1214 02/18/18 1822  TROPONINI 0.06* 0.06* 0.07* 0.08*   No results for input(s): TROPIPOC in the last 168 hours.   BNPNo results for input(s): BNP, PROBNP in the last 168 hours.   DDimer No results for input(s): DDIMER in the last 168 hours.   Radiology    No results found.  Cardiac Studies   2D echo 02/19/2018 Study Conclusions  - Left ventricle: The cavity size was normal. Wall thickness was   normal. Systolic function was normal. The estimated  ejection   fraction was in the range of 60% to 65%. Wall motion was normal;   there were no regional wall motion abnormalities. - Aortic valve: Valve mobility was restricted. There was severe   stenosis. There was moderate regurgitation. Peak velocity (S):   529 cm/s. Mean gradient (S): 46 mm Hg. - Mitral valve: No evidence of vegetation. - Left atrium: No evidence of thrombus in the appendage. - Right atrium: No evidence of thrombus in the atrial cavity or   appendage. - Atrial septum: No defect or patent foramen ovale was identified.   Echo contrast study showed no right-to-left atrial level shunt,   following an increase in RA pressure induced by provocative   maneuvers. Echo contrast study showed no right-to-left atrial   level shunt, at baseline or with provocation. - Tricuspid valve: No evidence of vegetation. - Pulmonic valve: No evidence of vegetation. - Superior vena cava: The study excluded a thrombus.  Patient Profile     67 y.o. male with history of multiple CVAs in the past, carotid artery stenosis s/p RCEA,  ASCAD s/p remote MI and PCI of the LAD in 1993, HTN, hyperlipidemia, PSVT, DM and now with new CVA and echo showing severe AS.  Assessment & Plan    1.  Severe aortic stenosis on TEE  -Review of 2D echocardiograms in the past have shown a bicuspid valve initially with mild to moderate aortic stenosis in 2015 but clearly this has progressed to severe aortic stenosis. -echo this admission shows peak aortic valve velocity is 529 cm/s with a mean aortic valve gradient of 49 mmHg and a calculated aortic valve area by VTI of 0.81 cm -There is also evidence of moderate aortic insufficiency with pressure half-time 373 ms -Management of this patient with severe aortic stenosis in the setting of multiple CVAs in the past and now another large acute CVA is extremely difficult.  First of all he is asymptomatic from his aortic stenosis at this time but is very sedentary at home and also has cognitive issues including memory problems so unsure his history is adequate. -I suspect that the risk of CVA would be similar between a TAVR as well as open AVR, but I am very concerned about the patient's ability to go through successful rehabilitation after an open chest procedure and therefore TAVR may be a better option once he has recovered from his CVA. - Clearly his risk of recurrent CVA is high with either cardiac procedure  given his underlying comorbidities.  -I have spoken to Dr. Excell Seltzerooper and feel that he would best benefit from a multidisciplinary evaluation by the structural heart team. -I think he needs to recover from his acute CVA and then be further evaluated -Dr. Excell Seltzerooper will arrange follow-up in his office once the patient has recovered from his CVA and progressed through rehab -I have explained this to the patient although I do not know how much he comprehends at this time - he has not CP or SOB at this time and does not appear volume overloaded on exam today.  2.  Acute CVA -This is a very difficult  situation given that the patient has had several CVAs in the past.  It is been unclear in the past the etiology of these and is felt to be either cardioembolic or small vessel disease given that he has had evidence of cerebrovascular disease on prior MRI as well as carotid disease.  - He is also, though, had atrial arrhythmias documented in  the past with mention in notes dating clear back to 2004 that he had runs of atypical atrial flutter as well as atrial fibrillation and he never followed up with the EP. -His CHADS2VASC score is 6 - I am  concerned that there was apparently atrial fibrillation documented in  past notes on H&P's but I cannot actually find any EKG showing atrial fibrillation.   -He has been on Plavix prior to admission and now with recurrent stroke would consider proceeding with NOAC therapy if ok with neuro  -continue statin therapy -no atrial arrhythmias noted on tele today -will set up outpt event monitor and if negative then loop recorder  3.  History of SVT with some notes dated back as far as 2004 showing paroxysmal atypical atrial flutter and atrial fibrillation. -The patient was supposed to follow-up with EP but never did.  -His home medications include digoxin, Cardizem and metoprolol which I suspect are for prior SVT and atrial arrhythmias. -In setting of normal LV function I would stop his digoxin and continue Cardizem and metoprolol.    4.  Hypertension -Blood pressure well controlled at 115/67mmHg today -Home BP medicines include Lopressor, hydralazine, Cardura, Cardizem  -blood pressure management per neurology  5  Hyperlipidemia with LDL goal less than 70. -Continue statin  -His LDL is at goal at 40 and HDL 50  6.  ASCAD -status post remote MI in 1993 with PCI of the LAD at that time -Last cardiac cath in 2004 showed 20% left main, multiple 20% lesions in the proximal and mid LAD with widely patent LAD stents, 40% proximal left circumflex and 40% ostial  OM1 and a codominant system.  There was a 30% proximal RCA that was small but codominant.  -He is not had any anginal symptoms although he is fairly sedentary but does use a walker to ambulate throughout his house -He is on Plavix, statin and beta-blocker. -trop mildly elevated this admission but LVF normal with no wall motion abnormalities and likely related to acute CVA.  He is asymptomatic from cardiac standpoint.  No further ischemic workup indicated.  We will sign off at this time.  Our office will arrange followup with Dr. Excell Seltzer to evaluate AS and with EP to address history of SVT/?afib following event monitor.  For questions or updates, please contact [facility] Please consult [email or web address] for contact info under Cardiology/STEMI.      Signed, Armanda Magic, MD  02/20/2018, 10:34 AM

## 2018-02-20 NOTE — Progress Notes (Signed)
  Speech Language Pathology Treatment: Cognitive-Linquistic  Patient Details Name: Nicholas [name] MRN: [id number] DOB: 08/21/1951 Today's Date: 02/20/2018 Time: 1520-1540 SLP Time Calculation (min) (ACUTE ONLY): 20 min  Assessment / Plan / Recommendation Clinical Impression  Pt was seen for skilled ST targeting cognitive-linguistic goals.  Pt asleep upon arrival but easily awakened to voice and light touch.  Pt was drowsy but pleasantly participatory throughout therapy session.  CIR admissions coordinator stopped by to discuss admission to CIR this afternoon and verified baseline information.  Pt has almost no reading or writing ability and his daughter helped him load his pill box and pay his bills every week.  Additionally, pt's daughter would prepare pt's meals for him.  Pt had improved visual scanning for locating items to the right of midline with min assist verbal cues.  Pt was able to name basic objects around the room without difficulty.  Suspect word finding difficulty noted on evaluation to be secondary to cognition versus primary impairment and is now improved as a function of improved mentation.  Pt was able to identify values of coins and complete simple mental math calculations with mod assist verbal cues.  Would recommend ST follow up at CIR to address cognition in order to maximize functional independence prior to discharge.    HPI HPI: Nicholas FullingWillie J Daleo is a 67 y/o with HTN, HLD, SVT, CAD with PCI to LAD , PAD s/p left fem-pop bypass and right CEA, CVA  who present with amnesia and vision loss in the right eye. Imaging with an MRI revealed a stroke.      SLP Plan  Continue with current plan of care       Recommendations                   Follow up Recommendations: Inpatient Rehab SLP Visit Diagnosis: Cognitive communication deficit (N02.725(R41.841) Plan: Continue with current plan of care       GO                Autumm Hattery, Melanee Spryicole L 02/20/2018, 3:45 PM

## 2018-02-20 NOTE — Care Management Note (Signed)
Case Management Note  Patient Details  Name: Erin FullingWillie J Buccieri MRN: [id number] Date of Birth: 04/09/1951  Subjective/Objective:                    Action/Plan: Pt discharging to CIR today. No further needs per CM.   Expected Discharge Date:  02/20/18               Expected Discharge Plan:  IP Rehab Facility  In-House Referral:     Discharge planning Services  CM Consult  Post Acute Care Choice:    Choice offered to:     DME Arranged:    DME Agency:     HH Arranged:    HH Agency:     Status of Service:  Completed, signed off  If discussed at [employer] Meetings, dates discussed:    Additional Comments:  Kermit BaloKelli F Treniece Holsclaw, RN 02/20/2018, 2:26 PM

## 2018-02-20 NOTE — Progress Notes (Signed)
Physical Therapy Treatment Patient Details Name: Nicholas [name] MRN: [id number] DOB: 09-Sep-1951 Today's Date: 02/20/2018    History of Present Illness 67 y.o. male admitted for AMS and amnesia. MRI brain shows left large acute PCA infarct. PMH includes CVA, right CEA, HTN, DM, hyperlipidemia, SVT, and CAD.     PT Comments    Patient familiar to this PT from prior admission. Pt received up in chair, having recently finished meal, reports he has been in chair for several hours, but HR reports only since OT was in the room before lunch. Pt reports he feels a bit worse today than yesterday, but given little detail. He does report some  Discomfort at the buttocks from the chair. Pt reports he continues to have difficulty with vision, unable to focus on text to read at this time. Transfers and bed mobility are performed at supervision level, AMB is trialed without AD as per reported baseline, but patient in unable to AMB more than a few steps without RW, and requires BUE for self stabilization. AMB with RW is more successful, ultimately covering 256ft, but gait speed is very slow, indicative of elevated falls risk. The patient has significant BLE gait deviations including some festination at gait initiation, wide based gait, poor foot clearance and functional foot drop, and flexed trunk, all of which are baseline, congenital purportedly per patient, and consistent with gait at prior admission. Continually limiting and impaired today as well as compared to baseline are Right field visual deficits, which limit the ability of the patient to navigate obstacle in hallway on right side without heavy verbal cuing; gait speed is 25% of PLOF, and pt is unable to manage RW independently, struggling to maintain a straight line of progress, and Mod-MaxA +1 for turns Bilat in hallway. Pt reports little fatigue at end of session, but is agreeable that management of RW is very difficult for him. As patient was an  independent AMB without AD PTA and now requires mod-maxA +RW for slow unsteady gait, he is still a good candidate for CIR. After today's session I do not anticipate any difficulty in tolerating high volume therapy at DC. Will continue to follow acutely.    Follow Up Recommendations  CIR     Equipment Recommendations  None recommended by PT    Recommendations for Other Services Rehab consult     Precautions / Restrictions Precautions Precautions: Fall Precaution Comments: pt with double vision Restrictions Weight Bearing Restrictions: No    Mobility  Bed Mobility Overal bed mobility: Modified Independent Bed Mobility: Sit to Supine     Supine to sit: Min guard;HOB elevated Sit to supine: Modified independent (Device/Increase time)   General bed mobility comments: PT assists with management of lines  Transfers Overall transfer level: Needs assistance Equipment used: Rolling walker (2 wheeled) Transfers: Sit to/from Stand Sit to Stand: Min guard Stand pivot transfers: Mod assist       General transfer comment: Uses hands on chair for rise, with eventual slow and safe transition to hands on RW   Ambulation/Gait Ambulation/Gait assistance: Mod assist Ambulation Distance (Feet): 200 Feet Assistive device: Rolling walker (2 wheeled)   Gait velocity: 0.72m/s (>0.35m/s in Nov 2018)    General Gait Details: Shuffling at gait initiation, wide based, with decreased foot clearance, difficulty managing straight plain AMB and RW management with out mod-maxA for A/E, poor navigation around obstacles in Right visual field.    Stairs  Wheelchair Mobility    Modified Rankin (Stroke Patients Only) Modified Rankin (Stroke Patients Only) Pre-Morbid Rankin Score: Slight disability Modified Rankin: Moderately severe disability     Balance Overall balance assessment: Needs assistance Sitting-balance support: Bilateral upper extremity supported;No upper extremity  supported Sitting balance-Leahy Scale: Good Sitting balance - Comments: Good static sitting balance.    Standing balance support: Bilateral upper extremity supported;During functional activity Standing balance-Leahy Scale: Poor Standing balance comment: required external support to maintain dynamic standing             High level balance activites: Side stepping;Direction changes;Turns;Backward walking(LOB with all above. )              Cognition Arousal/Alertness: Awake/alert Behavior During Therapy: WFL for tasks assessed/performed Overall Cognitive Status: Impaired/Different from baseline Area of Impairment: (continues to have some ST memory deficits per nursing. )                 Orientation Level: Time;Situation Current Attention Level: Sustained Memory: Decreased recall of precautions;Decreased short-term memory Following Commands: Follows one step commands with increased time Safety/Judgement: Decreased awareness of safety;Decreased awareness of deficits Awareness: Intellectual Problem Solving: Slow processing;Decreased initiation;Difficulty sequencing;Requires verbal cues;Requires tactile cues General Comments: Pt reporting month is "October" and year is 2018 when given multiple choice; requires multimodal cues and increased time to follow instructions, suspect increased need for multimodal cues also due to visual deficits; decreased motor planning noted       Exercises General Exercises - Lower Extremity Long Arc Quad: AROM;Both;15 reps Hip Flexion/Marching: AROM;Both;10 reps    General Comments General comments (skin integrity, edema, etc.): pt's sister present during session       Pertinent Vitals/Pain Pain Assessment: No/denies pain Pain Location: reports his bottom is sore from sitting in chair for so long.  Pain Intervention(s): Monitored during session;Repositioned;Limited activity within patient's tolerance    Home Living                       Prior Function            PT Goals (current goals can now be found in the care plan section) Acute Rehab PT Goals Patient Stated Goal: to get better PT Goal Formulation: With patient Time For Goal Achievement: 03/04/18 Potential to Achieve Goals: Good Progress towards PT goals: Progressing toward goals    Frequency    Min 4X/week      PT Plan Current plan remains appropriate    Co-evaluation              AM-PAC PT "6 Clicks" Daily Activity  Outcome Measure  Difficulty turning over in bed (including adjusting bedclothes, sheets and blankets)?: A Little Difficulty moving from lying on back to sitting on the side of the bed? : None Difficulty sitting down on and standing up from a chair with arms (e.g., wheelchair, bedside commode, etc,.)?: A Little Help needed moving to and from a bed to chair (including a wheelchair)?: A Lot Help needed walking in hospital room?: Total Help needed climbing 3-5 steps with a railing? : A Lot 6 Click Score: 15    End of Session Equipment Utilized During Treatment: Gait belt Activity Tolerance: Patient tolerated treatment well;No increased pain Patient left: in bed;with bed alarm set;with family/visitor present Nurse Communication: Mobility status PT Visit Diagnosis: Unsteadiness on feet (R26.81);Other abnormalities of gait and mobility (R26.89);Muscle weakness (generalized) (M62.81);History of falling (Z91.81)     Time: 1610-9604 PT Time Calculation (min) (ACUTE  ONLY): 29 min  Charges:  $Therapeutic Activity: 23-37 mins                    G Codes:       2:22 PM, 02/20/18 Rosamaria LintsAllan C Buccola, PT, DPT Physical Therapist - [place] [phone] (Pager)  [phone] (Office)       Buccola,Allan C 02/20/2018, 2:11 PM

## 2018-02-20 NOTE — Progress Notes (Signed)
Physical Medicine and Rehabilitation Consult Reason for Consult: Decreased functional mobility Referring Physician: [facility]   HPI: Nicholas [name] is a 67 y.o. right handed male with history of supraventricular tachycardia, CAD/PCI maintained on Plavix, urinary retention, small bowel obstruction, diabetes mellitus. Per chart review, patient lives alone. Used a cane prior to admission. He does not drive. One level home. He has a daughter and brother in the area who work. Presented 02/17/2018 with altered mental status as well as blurred vision. MRI reviewed, showing R>L CVA. Per report, acute large nonhemorrhagic temporal occipital lobe PCA territory infarcts as well as additional small acute posterior circulation and posterior watershed infarcts. Old bifrontal and biparietal lobe infarction the ACA and MCA territories. Chronically occluded right internal carotid artery. Patient did not receive TPA. CT angiogram head and neck showed left posterior cerebral artery P2 segment occlusion. No significant aneurysms stenosis or dissection. Echocardiogram with ejection fraction of 65% grade 1 diastolic dysfunction. Await plan for TEE/loop recorder. Currently maintained on aspirin and Plavix for CVA prophylaxis. Physical therapy evaluation completed 02/18/2018 with recommendations of physical medicine rehabilitation consult.   Review of Systems  Constitutional: Negative for chills and fever.  HENT: Negative for hearing loss.   Eyes: Positive for blurred vision.  Respiratory: Negative for cough and shortness of breath.   Cardiovascular: Negative for chest pain.  Gastrointestinal: Positive for constipation. Negative for nausea and vomiting.  Genitourinary: Negative for flank pain and hematuria.       Urinary retention  Musculoskeletal: Positive for myalgias.  Neurological: Negative for seizures.  All other systems reviewed and are negative.      Past Medical History:  Diagnosis Date  . At high risk  for falls   . BPH (benign prostatic hyperplasia)   . BPH (benign prostatic hypertrophy)    HX RETENTION  . Carotid artery occlusion    S/P RIGHT CEA  . Coronary artery disease CARDIOLOGIST--  DR Johney Frame   S/P  PCI TO LAD 1993  . GERD (gastroesophageal reflux disease)   . History of CVA (cerebrovascular accident)    1993---  NO RESIDUALS  . History of head injury    CONCUSSION--  NO RESIDUAL  . History of myocardial infarction    1993--  NON-Q WAVE  S/P PCI TO LAD  . Hydrocele, left   . Hyperlipidemia   . Hypertension   . Left carotid artery stenosis    MILD  . Myocardial infarction (HCC)   . PSVT (paroxysmal supraventricular tachycardia) (HCC)   . PVD (peripheral vascular disease) (HCC)    S/P LEFT FEM-POP  . Right leg weakness    USES CANE--  SECONDARY TO PVD  . SBO (small bowel obstruction) (HCC)   . Type 2 diabetes mellitus (HCC)   . Wears glasses         Past Surgical History:  Procedure Laterality Date  . CARDIAC CATHETERIZATION  01-18-2003   DR Eden Emms   NON-OBSTRUCTIVE CAD/  PREVIOIS ANGIOPLASTY SITE WIDELY PATENT  . CARDIOVASCULAR STRESS TEST  2007   SMALL ANTERO-APICAL SCAR/  NO ISCHEMIA  . CAROTID ENDARTERECTOMY Right 05-31-2003  . CORONARY ANGIOPLASTY  1993   PCI TO LAD  . FEMORAL-POPLITEAL BYPASS GRAFT Left 01-19-2003  . HYDROCELE EXCISION Left 10/27/2013   Procedure: HYDROCELECTOMY ADULT;  Surgeon: Lindaann Slough, MD;  Location: [facility];  Service: Urology;  Laterality: Left;  . RIGHT HYDROCELECTOMY  05-31-2003  . TRANSTHORACIC ECHOCARDIOGRAM  08-24-2010   EF 55%/  MILD AORTIC INSUFFICENCY/  MODERATE  LVH  . UMBILICAL HERNIA REPAIR N/A 10/21/2017   Procedure: UMBILICAL HERNIA REPAIR;  Surgeon: Manus Ruddsuei, Matthew, MD;  Location: [facility] OR;  Service: General;  Laterality: N/A;        Family History  Problem Relation Age of Onset  . Stroke Mother   . Hypertension Mother   . Hypertension Father     Social History:  reports that he has quit smoking. His smoking use included cigarettes. He quit after 0.00 years of use. he has never used smokeless tobacco. He reports that he does not drink alcohol or use drugs. Allergies: No Known Allergies       Medications Prior to Admission  Medication Sig Dispense Refill  . digoxin (LANOXIN) 0.25 MG tablet Take 1 tablet (0.25 mg total) by mouth daily. 30 tablet 0  . diltiazem (TAZTIA XT) 360 MG 24 hr capsule Take 1 capsule (360 mg total) by mouth daily. 30 capsule 0  . doxazosin (CARDURA) 2 MG tablet Take 1 tablet (2 mg total) by mouth daily. 30 tablet 0  . hydrALAZINE (APRESOLINE) 10 MG tablet Take 1 tablet (10 mg total) by mouth every 8 (eight) hours. (Patient taking differently: Take 10 mg by mouth at bedtime. ) 90 tablet 0  . metoprolol (LOPRESSOR) 50 MG tablet Take 1 tablet (50 mg total) by mouth 2 (two) times daily. 60 tablet 0  . rosuvastatin (CRESTOR) 10 MG tablet Take 1 tablet (10 mg total) by mouth daily. 30 tablet 0  . clopidogrel (PLAVIX) 75 MG tablet Take 1 tablet (75 mg total) by mouth daily with breakfast. (Patient not taking: Reported on 02/17/2018) 30 tablet 0  . mineral oil enema Place 133 mLs (1 enema total) as needed rectally for severe constipation. (Patient not taking: Reported on 02/17/2018) 133 mL 3  . polyethylene glycol (MIRALAX / GLYCOLAX) packet Take 17 g daily as needed by mouth. (Patient not taking: Reported on 02/17/2018) 14 each 0    Home: Home Living Family/patient expects to be discharged to:: Private residence Living Arrangements: Alone(daughter and brother come by) Available Help at Discharge: Family, Available PRN/intermittently Type of Home: Apartment Home Access: Level entry Home Layout: One level Bathroom Shower/Tub: Health visitorWalk-in shower Bathroom Toilet: Standard Bathroom Accessibility: Yes Home Equipment: Tub bench, Environmental consultantWalker - 4 wheels, Cane - single point, Wheelchair - manual, Bedside commode Additional  Comments: Reports he has a daughter and brother that live nearby to assist him  Lives With: Alone  Functional History: Prior Function Level of Independence: Independent with assistive device(s) Gait / Transfers Assistance Needed: Uses cane for ambulation into bathroom.  ADL's / Homemaking Assistance Needed: reports that he was independent with ADLs, dauhgter provided meals and grocery shopping. pt does not drive Comments: Uses cane for ambulation into bathroom. Rollator outside of home  Functional Status:  Mobility: Bed Mobility Overal bed mobility: Needs Assistance Bed Mobility: Supine to Sit Supine to sit: HOB elevated, Min assist Sit to supine: Min assist General bed mobility comments: use of rails to sit EOB, assist to bring LEs off bed, VCs for sequencing Transfers Overall transfer level: Needs assistance Equipment used: None Transfers: Sit to/from Stand Sit to Stand: Min assist Stand pivot transfers: Min assist General transfer comment: min assist for safety and instability with right lateral lean. tactile and verbal cues for upright posture. Ambulation/Gait Ambulation/Gait assistance: +2 physical assistance, Mod assist Ambulation Distance (Feet): 3 Feet Assistive device: 2 person hand held assist Gait Pattern/deviations: Step-to pattern, Shuffle, Decreased weight shift to right, Decreased weight shift to  left, Drifts right/left, Narrow base of support General Gait Details: ambulation demonstrated with stand step transfer. pt with very short, choppy shuffling steps and decreased weight shift bil, no foot clearance noted. pt demonstrated decreased proprioception. mod HHA +2 with verbal and tactile cues required for safety, stability, sequencing. Gait velocity: decreased Gait velocity interpretation: Below normal speed for age/gender  ADL: ADL Overall ADL's : Needs assistance/impaired Grooming: Minimal assistance, Standing, Cueing for safety Upper Body Bathing: Min guard,  Sitting Lower Body Bathing: Moderate assistance Upper Body Dressing : Min guard, Sitting Lower Body Dressing: Moderate assistance Toilet Transfer: Minimal assistance, BSC, RW, Cueing for safety Toileting- Clothing Manipulation and Hygiene: Minimal assistance, Sit to/from stand Functional mobility during ADLs: Minimal assistance, Cueing for safety General ADL Comments: pt reports that he wans to go home but is concerned if he can take care of himself right now  Cognition: Cognition Overall Cognitive Status: Impaired/Different from baseline Arousal/Alertness: Awake/alert Orientation Level: Oriented to person, Oriented to place, Disoriented to time, Disoriented to situation Attention: Sustained Sustained Attention: Impaired Sustained Attention Impairment: Verbal basic, Functional basic Memory: Impaired Memory Impairment: Decreased recall of new information Awareness: Appears intact Behaviors: Perseveration Safety/Judgment: Impaired Cognition Arousal/Alertness: Lethargic Behavior During Therapy: Flat affect Overall Cognitive Status: Impaired/Different from baseline Area of Impairment: Orientation, Attention, Memory, Following commands, Safety/judgement, Awareness, Problem solving Orientation Level: Disoriented to, Place, Time, Situation Current Attention Level: Focused, Sustained Memory: Decreased recall of precautions, Decreased short-term memory Following Commands: Follows one step commands inconsistently, Follows one step commands with increased time Safety/Judgement: Decreased awareness of safety, Decreased awareness of deficits Awareness: Intellectual Problem Solving: Slow processing, Decreased initiation, Difficulty sequencing, Requires verbal cues, Requires tactile cues General Comments: pt demonstrates no short term memory recall, perseverating on his condition, pt exremely lethargic and can barely keep eyes open, required upright sitting and standing for arousal and to follow  some commands.  Blood pressure (!) 153/53, pulse 80, temperature 99.4 F (37.4 C), temperature source Oral, resp. rate 20, height 6' (1.829 m), weight 78.3 kg (172 lb 9.9 oz), SpO2 97 %. Physical Exam  Vitals reviewed. Constitutional: He appears well-developed and well-nourished.  HENT:  Head: Normocephalic and atraumatic.  Eyes: EOM are normal. Right eye exhibits no discharge. Left eye exhibits no discharge.  Neck: Normal range of motion. Neck supple. No thyromegaly present.  Cardiovascular: Normal rate and regular rhythm.  Respiratory: Effort normal and breath sounds normal. No respiratory distress.  GI: Soft. Bowel sounds are normal. He exhibits no distension.  Musculoskeletal:  No edema or tenderness in extremities  Neurological: He is alert.  Patient provides his date of birth but could not give appropriate age.  Follow simple commands.  Limited medical historian. Motor: 4+/5 grossly throughout  Skin: Skin is warm and dry.  Psychiatric: His affect is blunt. His speech is delayed and slurred. He is slowed.  Assessment/Plan: Diagnosis: Acute large nonhemorrhagic temporal occipital lobe PCA territory infarcts, additional small acute posterior circulation and posterior watershed infarcts Labs and images independently reviewed.  Records reviewed and summated above. Stroke: Continue secondary stroke prophylaxis and Risk Factor Modification listed below:   Antiplatelet therapy:   Blood Pressure Management:  Continue current medication with prn's with permisive HTN per primary team Statin Agent:    1. Does the need for close, 24 hr/day medical supervision in concert with the patient's rehab needs make it unreasonable for this patient to be served in a less intensive setting? Yes  2. Co-Morbidities requiring supervision/potential complications: supraventricular tachycardia (monitor HR  with increased physical activity), CAD/PCI (cont meds), urinary retention (I/O cath as necessary),  small bowel obstruction (monitor), diabetes mellitus (Monitor in accordance with exercise and adjust meds as necessary), diastolic dysfunction (monitor for signs/symptoms of fluid overload), ABLA (transfuse if necessary to ensure appropriate perfusion for increased activity tolerance) 3. Due to bladder management, safety, disease management, medication administration and patient education, does the patient require 24 hr/day rehab nursing? Yes 4. Does the patient require coordinated care of a physician, rehab nurse, PT (1-2 hrs/day, 5 days/week), OT (1-2 hrs/day, 5 days/week) and SLP (1-2 hrs/day, 5 days/week) to address physical and functional deficits in the context of the above medical diagnosis(es)? Yes Addressing deficits in the following areas: balance, endurance, locomotion, strength, transferring, bowel/bladder control, feeding, toileting, cognition and psychosocial support 5. Can the patient actively participate in an intensive therapy program of at least 3 hrs of therapy per day at least 5 days per week? Potentially 6. The potential for patient to make measurable gains while on inpatient rehab is excellent 7. Anticipated functional outcomes upon discharge from inpatient rehab are supervision and min assist  with PT, supervision with OT, supervision with SLP. 8. Estimated rehab length of stay to reach the above functional goals is: 12-16days. 9. Anticipated D/C setting: Home 10. Anticipated post D/C treatments: HH therapy and Home excercise program 11. Overall Rehab/Functional Prognosis: good  RECOMMENDATIONS: This patient's condition is appropriate for continued rehabilitative care in the following setting: CIR after completion of medical workup. Patient has agreed to participate in recommended program. Yes Note that insurance prior authorization may be required for reimbursement for recommended care.  Comment: Rehab Admissions Coordinator to follow up.   Maryla Morrow, MD,  ABPMR Mcarthur Rossetti Angiulli, PA-C 02/19/2018          Revision History                        Routing History

## 2018-02-20 NOTE — H&P (Addendum)
Physical Medicine and Rehabilitation Admission H&P        Chief Complaint  Patient presents with  . Altered Mental Status  : HPI: Nicholas [name] is a 67 y.o. right handed male with history of CVA and received inpatient rehabilitation services August 2015,, concussion, back injury with left foot drop with lumbar stenosis received inpatient rehabilitation services April 2018, supraventricular tachycardia, CAD/PCI maintained on Plavix, urinary retention, small bowel obstruction, diabetes mellitus. Per chart review, patient lives alone. Used a cane prior to admission. He does not drive. One level home. He has a daughter and brother in the area who work. Presented 02/17/2018 with altered mental status as well as blurred vision. MRI reviewed, showing R>L CVA. Per report, acute large nonhemorrhagic temporal occipital lobe PCA territory infarcts as well as additional small acute posterior circulation and posterior watershed infarcts. Old bifrontal and biparietal lobe infarction the ACA and MCA territories. Chronically occluded right internal carotid artery. Patient did not receive TPA. CT angiogram head and neck showed left posterior cerebral artery P2 segment occlusion. No significant aneurysms stenosis or dissection. Echocardiogram with ejection fraction of 51% grade 1 diastolic dysfunction.Tee 02/19/2018 per Dr Radford Pax with severe aortic stenosis and currently no plan for intervention and he would follow-up with Dr. Burt Knack cardiology services as an outpatient to discuss further care after he recovers from CVA and progressed through rehabilitation . Plan 30 day event monitor as outpatient. Currently maintained on aspirin and Plavix for CVA prophylaxis however due to large size of stroke in recommended to start Eliquis no sooner than 02/23/2018 to avoid hemorrhagic conversion. Patient can remain on aspirin 81 mg at that time and Plavix can be discontinued.Marland Kitchen Physical and occupational therapy evaluations completed  02/18/2018 with recommendations of physical medicine rehabilitation consult.patient was admitted for a comprehensive rehab program.       Review of Systems  Constitutional: Negative for chills and fever.  HENT: Negative for hearing loss.   Eyes: Positive for blurred vision.  Respiratory: Negative for cough and shortness of breath.   Cardiovascular: Negative for chest pain, palpitations and leg swelling.  Gastrointestinal: Positive for constipation. Negative for nausea.       GERD  Genitourinary: Negative for dysuria and hematuria.       Urinary retention  Musculoskeletal: Positive for myalgias.  Skin: Negative for rash.  Psychiatric/Behavioral: Positive for memory loss.  All other systems reviewed and are negative.       Past Medical History:  Diagnosis Date  . At high risk for falls    . BPH (benign prostatic hyperplasia)    . BPH (benign prostatic hypertrophy)      HX RETENTION  . Carotid artery occlusion      S/P RIGHT CEA  . Coronary artery disease CARDIOLOGIST--  DR Rayann Heman    S/P  PCI TO LAD 1993  . GERD (gastroesophageal reflux disease)    . History of CVA (cerebrovascular accident)      1993---  NO RESIDUALS  . History of head injury      CONCUSSION--  NO RESIDUAL  . History of myocardial infarction      1993--  NON-Q WAVE  S/P PCI TO LAD  . Hydrocele, left    . Hyperlipidemia    . Hypertension    . Left carotid artery stenosis      MILD  . Myocardial infarction ([place])    . PSVT (paroxysmal supraventricular tachycardia) ([place])    . PVD (peripheral vascular disease) ([place])  S/P LEFT FEM-POP  . Right leg weakness      USES CANE--  SECONDARY TO PVD  . SBO (small bowel obstruction) ([place])    . Type 2 diabetes mellitus ([place])    . Wears glasses      Past Surgical History:  Procedure Laterality Date  . CARDIAC CATHETERIZATION   01-18-2003   DR Johnsie Cancel    NON-OBSTRUCTIVE CAD/  PREVIOIS ANGIOPLASTY SITE WIDELY PATENT  . CARDIOVASCULAR STRESS TEST   2007    SMALL  ANTERO-APICAL SCAR/  NO ISCHEMIA  . CAROTID ENDARTERECTOMY Right 05-31-2003  . CORONARY ANGIOPLASTY   1993    PCI TO LAD  . FEMORAL-POPLITEAL BYPASS GRAFT Left 01-19-2003  . HYDROCELE EXCISION Left 10/27/2013    Procedure: HYDROCELECTOMY ADULT;  Surgeon: Hanley Ben, MD;  [facility]  Service: Urology;  Laterality: Left;  . RIGHT HYDROCELECTOMY   05-31-2003  . TEE WITHOUT CARDIOVERSION N/A 02/19/2018    Procedure: TRANSESOPHAGEAL ECHOCARDIOGRAM (TEE);  Surgeon: Jerline Pain, MD;  Location[facility] ENDOSCOPY;  Service: Cardiovascular;  Laterality: N/A;  . TRANSTHORACIC ECHOCARDIOGRAM   08-24-2010    EF 55%/  MILD AORTIC INSUFFICENCY/  MODERATE LVH  . UMBILICAL HERNIA REPAIR N/A 10/21/2017    Procedure: UMBILICAL HERNIA REPAIR;  Surgeon: Donnie Mesa, MD;  Location: [facility] OR;  Service: General;  Laterality: N/A;         Family History  Problem Relation Age of Onset  . Stroke Mother    . Hypertension Mother    . Hypertension Father      Social History:  reports that he has quit smoking. His smoking use included cigarettes. He quit after 0.00 years of use. he has never used smokeless tobacco. He reports that he does not drink alcohol or use drugs. Allergies: No Known Allergies       Medications Prior to Admission  Medication Sig Dispense Refill  . digoxin (LANOXIN) 0.25 MG tablet Take 1 tablet (0.25 mg total) by mouth daily. 30 tablet 0  . diltiazem (TAZTIA XT) 360 MG 24 hr capsule Take 1 capsule (360 mg total) by mouth daily. 30 capsule 0  . doxazosin (CARDURA) 2 MG tablet Take 1 tablet (2 mg total) by mouth daily. 30 tablet 0  . hydrALAZINE (APRESOLINE) 10 MG tablet Take 1 tablet (10 mg total) by mouth every 8 (eight) hours. (Patient taking differently: Take 10 mg by mouth at bedtime. ) 90 tablet 0  . metoprolol (LOPRESSOR) 50 MG tablet Take 1 tablet (50 mg total) by mouth 2 (two) times daily. 60 tablet 0  . rosuvastatin (CRESTOR) 10 MG tablet Take 1 tablet (10  mg total) by mouth daily. 30 tablet 0  . clopidogrel (PLAVIX) 75 MG tablet Take 1 tablet (75 mg total) by mouth daily with breakfast. (Patient not taking: Reported on 02/17/2018) 30 tablet 0  . mineral oil enema Place 133 mLs (1 enema total) as needed rectally for severe constipation. (Patient not taking: Reported on 02/17/2018) 133 mL 3  . polyethylene glycol (MIRALAX / GLYCOLAX) packet Take 17 g daily as needed by mouth. (Patient not taking: Reported on 02/17/2018) 14 each 0      Drug Regimen Review Drug regimen was reviewed and remains appropriate with no significant issues identified   Home: Home Living Family/patient expects to be discharged to:: Private residence Living Arrangements: Alone(daughter and brother come by) Available Help at Discharge: Family, Available PRN/intermittently Type of Home: Apartment Home Access: Level entry Home Layout: One level Bathroom  Shower/Tub: Multimedia programmer: Standard Bathroom Accessibility: Yes Home Equipment: Tub bench, Environmental consultant - 4 wheels, Cane - single point, Wheelchair - manual, Bedside commode Additional Comments: Reports he has a daughter and brother that live nearby to assist him  Lives With: Alone   Functional History: Prior Function Level of Independence: Independent with assistive device(s) Gait / Transfers Assistance Needed: Uses cane for ambulation into bathroom.  ADL's / Homemaking Assistance Needed: reports that he was independent with ADLs, dauhgter provided meals and grocery shopping. pt does not drive Comments: Uses cane for ambulation into bathroom. Rollator outside of home    Functional Status:  Mobility: Bed Mobility Overal bed mobility: Needs Assistance Bed Mobility: Supine to Sit Supine to sit: Min guard, HOB elevated Sit to supine: Min assist General bed mobility comments: use of rails to sit EOB, no assist needed.  Transfers Overall transfer level: Needs assistance Equipment used: None Transfers: Sit  to/from Stand Sit to Stand: Min assist Stand pivot transfers: Min assist General transfer comment: Min A to steady in standing with wide BoS. Mild right lean. Able to find chair in right visual field today with cues.  Ambulation/Gait Ambulation/Gait assistance: Mod assist Ambulation Distance (Feet): 7 Feet(+4') Assistive device: 1 person hand held assist Gait Pattern/deviations: Step-to pattern, Shuffle, Decreased weight shift to right, Decreased weight shift to left, Wide base of support, Ataxic, Festinating, Leaning posteriorly General Gait Details: Short shuffling steps with cues to increase step length bilaterally; decreased weight shift bil and no foot clearance. Poor proprioception and sequencing. HHA for support and to help with advancing LE with chair follow. Gait velocity: decreased Gait velocity interpretation: Below normal speed for age/gender   ADL: ADL Overall ADL's : Needs assistance/impaired Grooming: Minimal assistance, Standing, Cueing for safety Upper Body Bathing: Min guard, Sitting Lower Body Bathing: Moderate assistance Upper Body Dressing : Min guard, Sitting Lower Body Dressing: Moderate assistance Toilet Transfer: Minimal assistance, BSC, RW, Cueing for safety Toileting- Clothing Manipulation and Hygiene: Minimal assistance, Sit to/from stand Functional mobility during ADLs: Minimal assistance, Cueing for safety General ADL Comments: pt reports that he wans to go home but is concerned if he can take care of himself right now   Cognition: Cognition Overall Cognitive Status: Impaired/Different from baseline Arousal/Alertness: Awake/alert Orientation Level: Oriented to person, Oriented to place, Disoriented to time, Disoriented to situation Attention: Sustained Sustained Attention: Impaired Sustained Attention Impairment: Verbal basic, Functional basic Memory: Impaired Memory Impairment: Decreased recall of new information Awareness: Appears intact Behaviors:  Perseveration Safety/Judgment: Impaired Cognition Arousal/Alertness: Awake/alert Behavior During Therapy: Flat affect Overall Cognitive Status: Impaired/Different from baseline Area of Impairment: Orientation, Memory, Following commands, Problem solving, Awareness, Safety/judgement Orientation Level: Disoriented to, Place, Time, Situation Current Attention Level: Sustained Memory: Decreased recall of precautions, Decreased short-term memory Following Commands: Follows one step commands with increased time Safety/Judgement: Decreased awareness of safety, Decreased awareness of deficits Awareness: Intellectual Problem Solving: Slow processing, Decreased initiation, Difficulty sequencing, Requires verbal cues, Requires tactile cues General Comments: Able to demonstrate some short memory recall within session today inconsistently and with contextual cues about 25% of the time. States it is "October" each time asked date even when told earlier the correct month. [facility] after being told multiple times during session. Able to gaze right with max cues for short periods but not sustain. Continues to have left/right visual field deficits. Poor motor planning.    Physical Exam: Blood pressure (!) 115/52, pulse 74, temperature (!) 97.4 F (36.3 C), temperature source Oral,  resp. rate 19, height 6' (1.829 m), weight 78.3 kg (172 lb 9.9 oz), SpO2 98 %. Physical Exam Vitals reviewed. Constitutional: He appears well-developed and well-nourished.  HENT:  Head: Normocephalic and atraumatic.  Eyes: EOM are normal. Right eye exhibits no discharge. Left eye exhibits no discharge.  Neck: Normal range of motion. Neck supple. No thyromegaly present.  Cardiovascular: Normal rate and regular rhythm.  Respiratory: Effort normal and breath sounds normal. No respiratory distress.  GI: Soft. Bowel sounds are normal. He exhibits no distension/nontender Skin. Patient with large scar right side of neck from  history of CVA Musculoskeletal:  No edema or tenderness in extremities  Neurological: He is alert.  Patient provides his date of birth but could not give appropriate age.  Follow simple commands.  Limited medical historian. Motor: 4+/5 grossly throughout except 0/5 left ankle dorsiflexors  Right homonymous hemianopsia and Right neglect  Sensation reported as equal to Light touch in BUE and BLE     Lab Results Last 48 Hours        Results for orders placed or performed during the hospital encounter of 02/17/18 (from the past 48 hour(s))  Glucose, capillary     Status: None    Collection Time: 02/18/18 11:58 AM  Result Value Ref Range    Glucose-Capillary 95 65 - 99 mg/dL    Comment 1 Notify RN      Comment 2 Document in Chart    Troponin I (q 6hr x 3)     Status: Abnormal    Collection Time: 02/18/18 12:14 PM  Result Value Ref Range    Troponin I 0.07 (HH) <0.03 ng/mL      Comment: CRITICAL VALUE NOTED.  VALUE IS CONSISTENT WITH PREVIOUSLY REPORTED AND CALLED VALUE. Performed at [facility], [place] [address] [place], [place] [address]    Glucose, capillary     Status: Abnormal    Collection Time: 02/18/18  4:28 PM  Result Value Ref Range    Glucose-Capillary 132 (H) 65 - 99 mg/dL    Comment 1 Notify RN      Comment 2 Document in Chart    Troponin I (q 6hr x 3)     Status: Abnormal    Collection Time: 02/18/18  6:22 PM  Result Value Ref Range    Troponin I 0.08 (HH) <0.03 ng/mL      Comment: CRITICAL VALUE NOTED.  VALUE IS CONSISTENT WITH PREVIOUSLY REPORTED AND CALLED VALUE. Performed at [facility], [place] [address], [place], [place] [address]    Glucose, capillary     Status: None    Collection Time: 02/18/18 10:19 PM  Result Value Ref Range    Glucose-Capillary 73 65 - 99 mg/dL  Glucose, capillary     Status: None    Collection Time: 02/19/18  7:01 AM  Result Value Ref Range    Glucose-Capillary 76 65 - 99 mg/dL  Glucose, capillary     Status: None     Collection Time: 02/19/18 11:14 AM  Result Value Ref Range    Glucose-Capillary 72 65 - 99 mg/dL  Glucose, capillary     Status: None    Collection Time: 02/19/18  4:31 PM  Result Value Ref Range    Glucose-Capillary 68 65 - 99 mg/dL    Comment 1 Notify RN      Comment 2 Document in Chart    Glucose, capillary     Status: Abnormal    Collection Time: 02/19/18  9:16 PM  Result Value Ref Range    Glucose-Capillary 117 (H) 65 - 99 mg/dL    Comment 1 Notify RN      Comment 2 Document in Chart    CBC with Differential/Platelet     Status: Abnormal    Collection Time: 02/20/18  4:28 AM  Result Value Ref Range    WBC 13.4 (H) 4.0 - 10.5 K/uL    RBC 3.47 (L) 4.22 - 5.81 MIL/uL    Hemoglobin 12.0 (L) 13.0 - 17.0 g/dL    HCT 34.9 (L) 39.0 - 52.0 %    MCV 100.6 (H) 78.0 - 100.0 fL    MCH 34.6 (H) 26.0 - 34.0 pg    MCHC 34.4 30.0 - 36.0 g/dL    RDW 12.3 11.5 - 15.5 %    Platelets 265 150 - 400 K/uL    Neutrophils Relative % 76 %    Neutro Abs 10.2 (H) 1.7 - 7.7 K/uL    Lymphocytes Relative 10 %    Lymphs Abs 1.3 0.7 - 4.0 K/uL    Monocytes Relative 13 %    Monocytes Absolute 1.7 (H) 0.1 - 1.0 K/uL    Eosinophils Relative 1 %    Eosinophils Absolute 0.1 0.0 - 0.7 K/uL    Basophils Relative 0 %    Basophils Absolute 0.0 0.0 - 0.1 K/uL      Comment: Performed at [facility], [address] [place], [place] [address]  Basic metabolic panel     Status: Abnormal    Collection Time: 02/20/18  4:28 AM  Result Value Ref Range    Sodium 137 135 - 145 mmol/L    Potassium 3.8 3.5 - 5.1 mmol/L    Chloride 101 101 - 111 mmol/L    CO2 29 22 - 32 mmol/L    Glucose, Bld 81 65 - 99 mg/dL    BUN 11 6 - 20 mg/dL    Creatinine, Ser 1.03 0.61 - 1.24 mg/dL    Calcium 8.7 (L) 8.9 - 10.3 mg/dL    GFR calc non Af Amer >60 >60 mL/min    GFR calc Af Amer >60 >60 mL/min      Comment: (NOTE) The eGFR has been calculated using the CKD EPI equation. This calculation has not been validated in all  clinical situations. eGFR's persistently <60 mL/min signify possible Chronic Kidney Disease.      Anion gap 7 5 - 15      Comment: Performed at [facility] [address] [place], [place] [address]  Magnesium     Status: None    Collection Time: 02/20/18  4:28 AM  Result Value Ref Range    Magnesium 1.9 1.7 - 2.4 mg/dL      Comment: Performed at [facility] [address], [place], [place] [address]  Glucose, capillary     Status: None    Collection Time: 02/20/18  6:17 AM  Result Value Ref Range    Glucose-Capillary 74 65 - 99 mg/dL  Glucose, capillary     Status: Abnormal    Collection Time: 02/20/18  7:34 AM  Result Value Ref Range    Glucose-Capillary 120 (H) 65 - 99 mg/dL      Imaging Results (Last 48 hours)  No results found.           Medical Problem List and Plan: 1.  Decreased functional mobility with altered mental status secondary to acute large nonhemorrhagic temporal  occipital lobe PCA territory infarct, additional small acute posterior circulation and posterior watershed infarcts. Plan 30 day event monitor as outpatient. 2.  DVT Prophylaxis/Anticoagulation: SCDs. Monitor for any signs of DVT 3. Pain Management: Tylenol as needed 4. Mood: Provide emotional support 5. Neuropsych: This patient is capable of making decisions on his own behalf. 6. Skin/Wound Care: Routine skin checks 7. Fluids/Electrolytes/Nutrition: Routine I&O's with follow-up chemistries 8. Supraventricular tachycardia with history of CAD/ PCI and noted findings of aortic stenosis. Current plan to follow up outpatient with cardiology services. Presently on aspirin and Plavix WITH PLAN TO BEGIN ELIQUIS 5 mg twice daily 02/23/2018 and decrease aspirin 81 mg daily and discontinue Plavix at that time 9. Diet controlled diabetes mellitus. Hemoglobin A1c 4.8. Currently with SSI. 10. History of urinary retention. Check PVR 3 11. Hyperlipidemia. Crestor 12. Constipation. Laxative  assistance     Post Admission Physician Evaluation: 1. Functional deficits secondary  toacute large nonhemorrhagic temporal occipital lobe PCA territory infarct, . 2. Patient is admitted to receive collaborative, interdisciplinary care between the physiatrist, rehab nursing staff, and therapy team. 3. Patient's level of medical complexity and substantial therapy needs in context of that medical necessity cannot be provided at a lesser intensity of care such as a SNF. 4. Patient has experienced substantial functional loss from his/her baseline which was documented above under the "Functional History" and "Functional Status" headings.  Judging by the patient's diagnosis, physical exam, and functional history, the patient has potential for functional progress which will result in measurable gains while on inpatient rehab.  These gains will be of substantial and practical use upon discharge  in facilitating mobility and self-care at the household level. 5. Physiatrist will provide 24 hour management of medical needs as well as oversight of the therapy plan/treatment and provide guidance as appropriate regarding the interaction of the two. 6. The Preadmission Screening has been reviewed and patient status is unchanged unless otherwise stated above. 7. 24 hour rehab nursing will assist with bladder management, bowel management, safety, skin/wound care, disease management, medication administration, pain management and patient education  and help integrate therapy concepts, techniques,education, etc. 8. PT will assess and treat for/with: pre gait, gait training, endurance , safety, equipment, neuromuscular re education.   Goals are: Supervision. 9. OT will assess and treat for/with: ADLs, Cognitive perceptual skills, Neuromuscular re education, safety, endurance, equipment.   Goals are: Supervision. Therapy may proceed with showering this patient. 10. SLP will assess and treat for/with: cognition, reading, med  management.  Goals are: compensate for visual field loss. 11. Case Management and Social Worker will assess and treat for psychological issues and discharge planning. 12. Team conference will be held weekly to assess progress toward goals and to determine barriers to discharge. 13. Patient will receive at least 3 hours of therapy per day at least 5 days per week. 14. ELOS: 12-16d       15. Prognosis:  good         Charlett Blake M.D. [employer] FAAPM&R (Sports Med, Neuromuscular Med) Diplomate Am Board of Electrodiagnostic Med  Elizabeth Sauer 02/20/2018

## 2018-02-20 NOTE — Progress Notes (Signed)
Inpatient Rehabilitation  Note patient's increased ability to tolerate IP Rehab.  Discussed plan of care with MD and received clearance to admit patient to IP Rehab today.  Will proceed with admit today.  Call if questions.   Charlane FerrettiMelissa Marquisa Salih, M.A., CCC/SLP Admission Coordinator  [facility]  Cell [phone]

## 2018-02-20 NOTE — Progress Notes (Signed)
Pt admitted to 4W10. Pt alert and oriented. Pt is in no pain. Continue plan of care./

## 2018-02-20 NOTE — Progress Notes (Addendum)
Occupational Therapy Treatment Patient Details Name: Nicholas [name] MRN: [id number] DOB: 1951-04-27 Today's Date: 02/20/2018    History of present illness 67 y.o. male admitted for AMS and amnesia. MRI brain shows left large acute PCA infarct. PMH includes CVA, right CEA, HTN, DM, hyperlipidemia, SVT, and CAD.    OT comments  Pt progressing towards goals, presents supine in bed pleasant and willing to work with therapy this session. Pt tolerating treatment session well, maintaining static sitting EOB with overall MinGuard assist and completing stand pivot transfer with ModA. Pt requires increased time and multimodal cues to follow one step commands throughout (suspect partly due to cognition as well as visual deficits). Continues to demonstrate visual deficits including diplopia which appears to significantly impact pt's overall functional performance. Feel pt remains appropriate candidate for CIR level services and demonstrates good activity tolerance to meet this level of therapy services (d/c recs have been updated to reflect). Will continue to follow acutely to further address pt's visual deficits and to maximize his overall safety and independence with ADLs and mobility prior to discharge.   Follow Up Recommendations  CIR    Equipment Recommendations  Other (comment)(TBD in next venue )          Precautions / Restrictions Precautions Precautions: Fall Precaution Comments: pt with double vision Restrictions Weight Bearing Restrictions: No       Mobility Bed Mobility Overal bed mobility: Needs Assistance Bed Mobility: Supine to Sit     Supine to sit: Min guard;HOB elevated     General bed mobility comments: increased time and cues to scoot to EOB, no physical assist needed   Transfers Overall transfer level: Needs assistance Equipment used: 1 person hand held assist Transfers: Sit to/from Stand Sit to Stand: Min assist Stand pivot transfers: Mod assist        General transfer comment: MinA to rise and steady; multimodal cues for pt hand placement over therapist elbows for increased stability; pt with wide BoS, taking very small pivotal steps (with multimodal cues to do so) and with increased time to complete stand pivot to recliner    Balance Overall balance assessment: Needs assistance Sitting-balance support: Feet supported;No upper extremity supported Sitting balance-Leahy Scale: Good Sitting balance - Comments: Good static sitting balance.    Standing balance support: During functional activity;Single extremity supported Standing balance-Leahy Scale: Poor Standing balance comment: required external support to maintain dynamic standing                           ADL either performed or assessed with clinical judgement   ADL Overall ADL's : Needs assistance/impaired Eating/Feeding: Set up;Sitting;Bed level                   Lower Body Dressing: Moderate assistance Lower Body Dressing Details (indicate cue type and reason): pt reaching to adjust socks seated EOB               General ADL Comments: pt performing bed mobility, sitting EOB approx 15 min with supervision for static sitting balace; further assessed vision while sitting EOB including trial of partial occlusion      Vision Patient Visual Report: Diplopia Additional Comments: pt with reports of double vision (vertical), disappears with L eye occluded; pt's vision appears to significantly impact his functional performance               Cognition Arousal/Alertness: Awake/alert Behavior During Therapy: Flat affect Overall Cognitive Status:  Impaired/Different from baseline Area of Impairment: Orientation;Memory;Following commands;Problem solving;Awareness;Safety/judgement                 Orientation Level: Time;Situation Current Attention Level: Sustained Memory: Decreased recall of precautions;Decreased short-term memory Following Commands:  Follows one step commands with increased time Safety/Judgement: Decreased awareness of safety;Decreased awareness of deficits Awareness: Intellectual Problem Solving: Slow processing;Decreased initiation;Difficulty sequencing;Requires verbal cues;Requires tactile cues General Comments: Pt reporting month is "October" and year is 2018 when given multiple choice; requires multimodal cues and increased time to follow instructions, suspect increased need for multimodal cues also due to visual deficits; decreased motor planning noted                     General Comments pt's sister present during session     Pertinent Vitals/ Pain       Pain Assessment: No/denies pain                                                          Frequency  Min 2X/week        Progress Toward Goals  OT Goals(current goals can now be found in the care plan section)  Progress towards OT goals: Progressing toward goals  Acute Rehab OT Goals Patient Stated Goal: to get better OT Goal Formulation: With patient Time For Goal Achievement: 03/04/18 Potential to Achieve Goals: Good  Plan Discharge plan needs to be updated                     AM-PAC PT "6 Clicks" Daily Activity     Outcome Measure   Help from another person eating meals?: None Help from another person taking care of personal grooming?: A Little Help from another person toileting, which includes using toliet, bedpan, or urinal?: A Lot Help from another person bathing (including washing, rinsing, drying)?: A Lot Help from another person to put on and taking off regular upper body clothing?: A Little Help from another person to put on and taking off regular lower body clothing?: A Lot 6 Click Score: 16    End of Session Equipment Utilized During Treatment: Gait belt  OT Visit Diagnosis: Unsteadiness on feet (R26.81);Other abnormalities of gait and mobility (R26.89);Muscle weakness (generalized)  (M62.81);Other symptoms and signs involving cognitive function   Activity Tolerance Patient tolerated treatment well   Patient Left in chair;with call bell/phone within reach;with chair alarm set;with family/visitor present   Nurse Communication Mobility status        Time: [id number] OT Time Calculation (min): 25 min  Charges: OT General Charges $OT Visit: 1 Visit OT Treatments $Therapeutic Activity: 8-22 mins  Marcy SirenBreanna Derin Matthes, OT Pager [phone] 02/20/2018    Orlando PennerBreanna L Jazmarie Biever 02/20/2018, 1:51 PM

## 2018-02-20 NOTE — Plan of Care (Signed)
Discussed with Dr. Hanley BenAlekh. He referred to Dr. Norris Crossurner's note that pt likely to have afib documented in previous HPIs. If there is afib documented, I feel pt should be put on DOACs for stroke prevention given embolic stroke pattern and hx of other strokes. Due to large size of stroke, we recommend to start DOAC such as eliquis 5mg  bid no sooner than 02/23/18 to avoid hemorrhagic conversion. Also due to intracranial and extracranial atherosclerosis, we recommend ASA 81mg  daily on top of eliquis. Plavix can be stopped once eliquis started.   Nicholas PlanJindong Johnattan Strassman, MD PhD Stroke Neurology 02/20/2018 11:57 AM

## 2018-02-20 NOTE — Progress Notes (Signed)
PMR Admission Coordinator Pre-Admission Assessment  Patient: Nicholas [name] is an 67 y.o., male MRN: [id number] DOB: [birth year] Height: 6' (182.9 cm) Weight: 78.3 kg (172 lb 9.9 oz)                                                                                                                                                  Insurance Information HMO:     PPO:      PCP:      IPA:      80/20:      OTHER:  PRIMARY: Medicare A & B      Policy#: [id number]      Subscriber: Self CM Name:       Phone#:      Fax#:  Pre-Cert#: Eligible       Employer: Retired  Benefits:  Phone #: Verified online     Name: [employer] Portal  Eff. Date: 06/09/02     Deduct: $1364      Out of Pocket Max: N/A      Life Max: N/A CIR: 100%      SNF: 100% days 1-20; 80% days 21-100 Outpatient: 80%     Co-Pay: 20% Home Health: 100%      Co-Pay: $0 DME: 80%     Co-Pay: 20% Providers: Patient's choice  SECONDARY: None        Medicaid Application Date:       Case Manager:  Disability Application Date:       Case Worker:   Emergency Contact Information        Contact Information    Name Relation Home Work Mobile   [name],[name] Daughter [phone]  [phone]   [name],[name] Relative   [phone]   Alan, Drummer [phone]       Current Medical History  Patient Admitting Diagnosis: Acute large nonhemorrhagic temporal occipital lobe PCA territory infarcts,additional small acute posterior circulation and posterior watershed infarcts   History of Present Illness: Nicholas [name] a 67 y.o.right handed malewith history ofCVA and received inpatient rehabilitation services August 2015, concussion, back injury with left foot drop with lumbar stenosis received inpatient rehabilitation services April 2018,supraventricular tachycardia, CAD/PCI maintained on Plavix, urinary retention, small bowel obstruction, diabetes mellitus. Per chart review,patient lives alone in an  apartment. Used a cane and Rollator walker prior to admission.He does not drive.One level home. He has a daughter and brother in the area who work. Presented 02/17/2018 with altered mental status as well as blurred vision. MRIreviewed, showing R>L CVA. Per report,acute large nonhemorrhagic temporal occipital lobe PCA territory infarcts as well as additional small acute posterior circulation and posterior watershed infarcts. Old bifrontal and biparietal lobe infarction the ACA and MCA territories. Chronically occluded right internal carotid artery. Patient did not receive TPA. CT angiogram head and neck showed left posterior cerebral artery P2 segment occlusion.  No significant aneurysms stenosis or dissection. Echocardiogram with ejection fraction of 65% grade 1 diastolic dysfunction. TEE 02/19/2018 per Dr. Mayford Knifeurner with severe aortic stenosisand currently no plan for intervention and he would follow-up with Dr. Excell Seltzerooper cardiology services as an outpatient to discuss further care after he recovers from CVA and progressed through rehabilitation. Plan 30 day event monitor as outpatient.Currently maintained on aspirin and Plavix for CVA prophylaxishowever due to large size of stroke in recommended to start Eliquisno sooner than 02/23/2018 to avoid hemorrhagic conversion. Patient can remain on aspirin81 mgat that time and Plavix can be discontinued. Physicaland occupationaltherapy evaluationscompleted 02/18/2018 with recommendations of physical medicine rehabilitation consult. Patient was admitted for a comprehensive rehab program 02/20/18.   NIH Total: 4  Past Medical History      Past Medical History:  Diagnosis Date  . At high risk for falls   . BPH (benign prostatic hyperplasia)   . BPH (benign prostatic hypertrophy)    HX RETENTION  . Carotid artery occlusion    S/P RIGHT CEA  . Coronary artery disease CARDIOLOGIST--  DR Johney FrameALLRED   S/P  PCI TO LAD 1993  . GERD (gastroesophageal  reflux disease)   . History of CVA (cerebrovascular accident)    1993---  NO RESIDUALS  . History of head injury    CONCUSSION--  NO RESIDUAL  . History of myocardial infarction    1993--  NON-Q WAVE  S/P PCI TO LAD  . Hydrocele, left   . Hyperlipidemia   . Hypertension   . Left carotid artery stenosis    MILD  . Myocardial infarction (HCC)   . PSVT (paroxysmal supraventricular tachycardia) (HCC)   . PVD (peripheral vascular disease) (HCC)    S/P LEFT FEM-POP  . Right leg weakness    USES CANE--  SECONDARY TO PVD  . SBO (small bowel obstruction) (HCC)   . Type 2 diabetes mellitus (HCC)   . Wears glasses     Family History  family history includes Hypertension in his father and mother; Stroke in his mother.  Prior Rehab/Hospitalizations:  Has the patient had major surgery during 100 days prior to admission? Yes  Current Medications   Current Facility-Administered Medications:  .  0.9 %  sodium chloride infusion, , Intravenous, Continuous, Doutova, Anastassia, MD, Stopped at 02/20/18 0730 .  acetaminophen (TYLENOL) tablet 650 mg, 650 mg, Oral, Q4H PRN **OR** acetaminophen (TYLENOL) solution 650 mg, 650 mg, Per Tube, Q4H PRN **OR** acetaminophen (TYLENOL) suppository 650 mg, 650 mg, Rectal, Q4H PRN, Doutova, Anastassia, MD .  aspirin EC tablet 325 mg, 325 mg, Oral, Daily, Costello, Mary A, NP, 325 mg at 02/20/18 1044 .  clopidogrel (PLAVIX) tablet 75 mg, 75 mg, Oral, Q breakfast, Doutova, Anastassia, MD, 75 mg at 02/20/18 0833 .  insulin aspart (novoLOG) injection 0-5 Units, 0-5 Units, Subcutaneous, QHS, Doutova, Anastassia, MD .  insulin aspart (novoLOG) injection 0-9 Units, 0-9 Units, Subcutaneous, TID WC, Doutova, Anastassia, MD, 1 Units at 02/18/18 1815 .  metoprolol tartrate (LOPRESSOR) injection 5 mg, 5 mg, Intravenous, Q4H PRN, Rizwan, Saima, MD .  rosuvastatin (CRESTOR) tablet 10 mg, 10 mg, Oral, Daily, Doutova, Anastassia, MD, 10 mg at 02/20/18  1044 .  senna-docusate (Senokot-S) tablet 1 tablet, 1 tablet, Oral, QHS PRN, Adela Glimpseoutova, Anastassia, MD  Patients Current Diet: Fall precautions Aspiration precautions Diet Carb Modified Fluid consistency: Thin; Room service appropriate? Yes Diet - low sodium heart healthy  Precautions / Restrictions Precautions Precautions: Fall Precaution Comments: pt with double vision Restrictions Weight  Bearing Restrictions: No   Has the patient had 2 or more falls or a fall with injury in the past year?Yes  Prior Activity Level Limited Community (1-2x/wk): Prior to admission patient independnet at home with assist from daughter for meal prep, medication, and money mangement tasks.  Patient unable to read and write at baseline, but would initial things, such as bills after she read them to him.  He got out weekly to shop with his daughter.    Home Assistive Devices / Equipment Home Assistive Devices/Equipment: Wheelchair Home Equipment: Tub bench, Environmental consultant - 4 wheels, Cane - single point, Wheelchair - manual, Bedside commode  Prior Device Use: Indicate devices/aids used by the patient prior to current illness, exacerbation or injury? Rollator walker and single point cane   Prior Functional Level Prior Function Level of Independence: Independent with assistive device(s) Gait / Transfers Assistance Needed: Uses cane for ambulation into bathroom.  ADL's / Homemaking Assistance Needed: reports that he was independent with ADLs, dauhgter provided meals and grocery shopping. pt does not drive Comments: Uses cane for ambulation into bathroom. Rollator outside of home   Self Care: Did the patient need help bathing, dressing, using the toilet or eating? Independent  Indoor Mobility: Did the patient need assistance with walking from room to room (with or without device)? Independent  Stairs: Did the patient need assistance with internal or external stairs (with or without device)?  Dependent  Functional Cognition: Did the patient need help planning regular tasks such as shopping or remembering to take medications? Needed some help, took meds once loaded in pill box by daughter and paid bills together   Current Functional Level Cognition  Arousal/Alertness: Awake/alert Overall Cognitive Status: Impaired/Different from baseline Current Attention Level: Sustained Orientation Level: Oriented to person, Oriented to place, Disoriented to time, Disoriented to situation Following Commands: Follows one step commands with increased time Safety/Judgement: Decreased awareness of safety, Decreased awareness of deficits General Comments: Pt reporting month is "October" and year is 2018 when given multiple choice; requires multimodal cues and increased time to follow instructions, suspect increased need for multimodal cues also due to visual deficits; decreased motor planning noted  Attention: Sustained Sustained Attention: Impaired Sustained Attention Impairment: Verbal basic, Functional basic Memory: Impaired Memory Impairment: Decreased recall of new information Awareness: Appears intact Behaviors: Perseveration Safety/Judgment: Impaired    Extremity Assessment (includes Sensation/Coordination)  Upper Extremity Assessment: Defer to OT evaluation RUE Coordination: decreased fine motor  Lower Extremity Assessment: RLE deficits/detail, Difficult to assess due to impaired cognition, LLE deficits/detail RLE Deficits / Details: hip 4/5, knee 5/5, ankle 3-/5 RLE Sensation: decreased light touch, decreased proprioception LLE Deficits / Details: hip 4/5, knee 4/5, ankle 3-/5 LLE Sensation: decreased light touch, decreased proprioception    ADLs  Overall ADL's : Needs assistance/impaired Eating/Feeding: Set up, Sitting, Bed level Grooming: Minimal assistance, Standing, Cueing for safety Upper Body Bathing: Min guard, Sitting Lower Body Bathing: Moderate assistance Upper  Body Dressing : Min guard, Sitting Lower Body Dressing: Moderate assistance Lower Body Dressing Details (indicate cue type and reason): pt reaching to adjust socks seated EOB Toilet Transfer: Minimal assistance, BSC, RW, Cueing for safety Toileting- Clothing Manipulation and Hygiene: Minimal assistance, Sit to/from stand Functional mobility during ADLs: Minimal assistance, Cueing for safety General ADL Comments: pt performing bed mobility, sitting EOB approx 15 min with supervision for static sitting balace; further assessed vision while sitting EOB including trial of partial occlusion     Mobility  Overal bed mobility: Modified Independent Bed  Mobility: Sit to Supine Supine to sit: Min guard, HOB elevated Sit to supine: Modified independent (Device/Increase time) General bed mobility comments: PT assists with management of lines    Transfers  Overall transfer level: Needs assistance Equipment used: Rolling walker (2 wheeled) Transfers: Sit to/from Stand Sit to Stand: Min guard Stand pivot transfers: Mod assist General transfer comment: Uses hands on chair for rise, with eventual slow and safe transition to hands on RW     Ambulation / Gait / Stairs / Wheelchair Mobility  Ambulation/Gait Ambulation/Gait assistance: Mod assist Ambulation Distance (Feet): 200 Feet Assistive device: Rolling walker (2 wheeled) Gait Pattern/deviations: Step-to pattern, Shuffle, Decreased weight shift to right, Decreased weight shift to left, Wide base of support, Ataxic, Festinating, Leaning posteriorly General Gait Details: Shuffling at gait initiation, wide based, with decreased foot clearance, difficulty managing straight plain AMB and RW management with out mod-maxA for A/E, poor navigation around obstacles in Right visual field.  Gait velocity: 0.18m/s (>0.34m/s in Nov 2018)  Gait velocity interpretation: Below normal speed for age/gender    Posture / Balance Dynamic Sitting Balance Sitting  balance - Comments: Good static sitting balance.  Balance Overall balance assessment: Needs assistance Sitting-balance support: Bilateral upper extremity supported, No upper extremity supported Sitting balance-Leahy Scale: Good Sitting balance - Comments: Good static sitting balance.  Standing balance support: Bilateral upper extremity supported, During functional activity Standing balance-Leahy Scale: Poor Standing balance comment: required external support to maintain dynamic standing High level balance activites: Side stepping, Direction changes, Turns, Backward walking(LOB with all above. )    Special needs/care consideration BiPAP/CPAP: No CPM: No Continuous Drip IV: No Dialysis: No         Life Vest: No Oxygen: No Special Bed: No Trach Size: No Wound Vac (area): No       Skin: Dry, scar on right side of neck                                Bowel mgmt: None documented since admission on 02/17/18 Bladder mgmt: Continent, but has an external catheter; TBD Diabetic mgmt: HgbA1c 4.8     Previous Home Environment Living Arrangements: Alone  Lives With: Alone Available Help at Discharge: Family, Available 24 hours/day Type of Home: Apartment Home Layout: One level Home Access: Level entry Bathroom Shower/Tub: Health visitor: Standard Bathroom Accessibility: Yes How Accessible: Accessible via walker Home Care Services: No Additional Comments: Reports he has a daughter lives nearby who can assist him  Discharge Living Setting Plans for Discharge Living Setting: Other (Comment)(Daughter's home priro to transition back to his apt.) Type of Home at Discharge: House Discharge Home Layout: Two level, Able to live on main level with bedroom/bathroom Discharge Home Access: Level entry(threshold ) Discharge Bathroom Shower/Tub: Tub/shower unit Discharge Bathroom Toilet: Standard Discharge Bathroom Accessibility: Yes How Accessible: Accessible via walker Does  the patient have any problems obtaining your medications?: No  Social/Family/Support Systems Patient Roles: Parent, Other (Comment)(Sibling ) Contact Information: Daughter: Bard Herbert  Anticipated Caregiver: daughter and her spouse  Anticipated Caregiver's Contact Information: [phone] Ability/Limitations of Caregiver: she works days, spouse able to assist  Caregiver Availability: 24/7 Discharge Plan Discussed with Primary Caregiver: Yes Is Caregiver In Agreement with Plan?: Yes Does Caregiver/Family have Issues with Lodging/Transportation while Pt is in Rehab?: No  Goals/Additional Needs Patient/Family Goal for Rehab: NW:[id number] A; OT/SLP:Supervision- Expected length of stay: 12-16 days Cultural Considerations: None Dietary Needs: Carb Mod.  diet restrictions  Equipment Needs: TBD Pt/Family Agrees to Admission and willing to participate: Yes Program Orientation Provided & Reviewed with Pt/Caregiver Including Roles  & Responsibilities: Yes  Decrease burden of Care through IP rehab admission: No  Possible need for SNF placement upon discharge: Potentially   Patient Condition: This patient's medical and functional status has changed since the consult dated 02/19/18 in which the Rehabilitation Physician determined and documented that the patient was potentially appropriate for intensive rehabilitative care in an inpatient rehabilitation facility. Issues have been addressed and update has been discussed with Dr. Wynn Banker  and patient now appropriate for inpatient rehabilitation. Will admit to inpatient rehab today.   Preadmission Screen Completed By:  Fae Pippin, 02/20/2018 2:30 PM ______________________________________________________________________   Discussed status with Dr. Wynn Banker on 02/20/18 at 1435 and received telephone approval for admission today.  Admission Coordinator:  Fae Pippin, time 1435/Date 02/20/18             Cosigned by: Erick Colace, MD at 02/20/2018 2:52 PM  Revision History

## 2018-02-20 NOTE — PMR Pre-admission (Signed)
PMR Admission Coordinator Pre-Admission Assessment  Patient: Nicholas [name] is an 67 y.o., male MRN: [id number] DOB: 12/29/1950 Height: 6' (182.9 cm) Weight: 78.3 kg (172 lb 9.9 oz)              Insurance Information HMO:     PPO:      PCP:      IPA:      80/20:      OTHER:  PRIMARY: Medicare A & B      Policy#: [id number]      Subscriber: Self CM Name:       Phone#:      Fax#:  Pre-Cert#: Eligible       Employer: Retired  Benefits:  Phone #: Verified online     Name: [employer]  Eff. Date: 06/09/02     Deduct: $1364      Out of Pocket Max: N/A      Life Max: N/A CIR: 100%      SNF: 100% days 1-20; 80% days 21-100 Outpatient: 80%     Co-Pay: 20% Home Health: 100%      Co-Pay: $0 DME: 80%     Co-Pay: 20% Providers: Patient's choice  SECONDARY: None        Medicaid Application Date:       Case Manager:  Disability Application Date:       Case Worker:   Emergency Contact Information Contact Information    Name Relation Home Work Mobile   Nicholas [name] Daughter [phone]  [phone]   Nicholas [name] Relative   [phone]   Nicholas [name] Brother [phone]       Current Medical History  Patient Admitting Diagnosis: 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 Per chart review,patient lives alone in an apartment. Used a cane and Rollator walker prior to admission.He does not drive.One level home. He has a daughter and brother in the area who work. Presented  02/17/2018 with altered mental status as well as blurred vision. MRIreviewed, showing R>L CVA. Per report,acute large nonhemorrhagic temporal occipital lobe PCA territory infarcts as well as additional small acute posterior circulation and posterior watershed infarcts. Old bifrontal and biparietal lobe infarction the ACA and MCA territories. Chronically occluded right internal carotid artery. Patient did not receive TPA. CT angiogram head and neck showed left posterior cerebral artery P2 segment occlusion. No significant aneurysms stenosis or dissection. Echocardiogram with ejection fraction of 65% grade 1 diastolic dysfunction. TEE 02/19/2018 per Dr. Mayford Knifeurner with severe aortic stenosis and currently no plan for intervention and he would follow-up with Dr. Excell Seltzerooper cardiology services as an outpatient to discuss further care after he recovers from CVA and progressed through rehabilitation. Plan 30 day event monitor as outpatient. Currently maintained on aspirin and Plavix for CVA prophylaxis however due to large size of stroke in recommended to start Eliquis no sooner than 02/23/2018 to avoid hemorrhagic conversion. Patient can remain on aspirin 81 mg at that time and Plavix can be discontinued. Physical and occupational therapy evaluations completed 02/18/2018 with recommendations of physical medicine rehabilitation consult. Patient was admitted for a comprehensive rehab program 02/20/18.   NIH Total: 4  Past Medical History  Past Medical History:  Diagnosis Date  . At high risk for falls   . BPH (benign prostatic hyperplasia)   . BPH (benign prostatic hypertrophy)    HX RETENTION  . Carotid artery occlusion    S/P RIGHT CEA  . Coronary artery disease CARDIOLOGIST--  DR Johney Frame   S/P  PCI TO LAD 1993  . GERD (gastroesophageal reflux disease)   . History of CVA (cerebrovascular accident)    1993---  NO RESIDUALS  . History of head injury    CONCUSSION--  NO RESIDUAL  . History of myocardial  infarction    1993--  NON-Q WAVE  S/P PCI TO LAD  . Hydrocele, left   . Hyperlipidemia   . Hypertension   . Left carotid artery stenosis    MILD  . Myocardial infarction (HCC)   . PSVT (paroxysmal supraventricular tachycardia) (HCC)   . PVD (peripheral vascular disease) (HCC)    S/P LEFT FEM-POP  . Right leg weakness    USES CANE--  SECONDARY TO PVD  . SBO (small bowel obstruction) (HCC)   . Type 2 diabetes mellitus (HCC)   . Wears glasses     Family History  family history includes Hypertension in his father and mother; Stroke in his mother.  Prior Rehab/Hospitalizations:  Has the patient had major surgery during 100 days prior to admission? Yes  Current Medications   Current Facility-Administered Medications:  .  0.9 %  sodium chloride infusion, , Intravenous, Continuous, Doutova, Anastassia, MD, Stopped at 02/20/18 0730 .  acetaminophen (TYLENOL) tablet 650 mg, 650 mg, Oral, Q4H PRN **OR** acetaminophen (TYLENOL) solution 650 mg, 650 mg, Per Tube, Q4H PRN **OR** acetaminophen (TYLENOL) suppository 650 mg, 650 mg, Rectal, Q4H PRN, Doutova, Anastassia, MD .  aspirin EC tablet 325 mg, 325 mg, Oral, Daily, Costello, Mary A, NP, 325 mg at 02/20/18 1044 .  clopidogrel (PLAVIX) tablet 75 mg, 75 mg, Oral, Q breakfast, Doutova, Anastassia, MD, 75 mg at 02/20/18 0833 .  insulin aspart (novoLOG) injection 0-5 Units, 0-5 Units, Subcutaneous, QHS, Doutova, Anastassia, MD .  insulin aspart (novoLOG) injection 0-9 Units, 0-9 Units, Subcutaneous, TID WC, Doutova, Anastassia, MD, 1 Units at 02/18/18 1815 .  metoprolol tartrate (LOPRESSOR) injection 5 mg, 5 mg, Intravenous, Q4H PRN, Rizwan, Saima, MD .  rosuvastatin (CRESTOR) tablet 10 mg, 10 mg, Oral, Daily, Doutova, Anastassia, MD, 10 mg at 02/20/18 1044 .  senna-docusate (Senokot-S) tablet 1 tablet, 1 tablet, Oral, QHS PRN, Adela Glimpse, Anastassia, MD  Patients Current Diet: Fall precautions Aspiration precautions Diet Carb Modified Fluid  consistency: Thin; Room service appropriate? Yes Diet - low sodium heart healthy  Precautions / Restrictions Precautions Precautions: Fall Precaution Comments: pt with double vision Restrictions Weight Bearing Restrictions: No   Has the patient had 2 or more falls or a fall with injury in the past year?Yes  Prior Activity Level Limited Community (1-2x/wk): Prior to admission patient independnet at home with assist from daughter for meal prep, medication, and money mangement tasks.  Patient unable to read and write at baseline, but would initial things, such as bills after she read them to him.  He got out weekly to shop with his daughter.    Home Assistive Devices / Equipment Home Assistive Devices/Equipment: Wheelchair Home Equipment: Tub bench, Environmental consultant - 4 wheels, Cane - single point, Wheelchair - manual, Bedside commode  Prior Device Use: Indicate devices/aids used by the patient prior to current illness, exacerbation or injury? Rollator walker and single point  cane   Prior Functional Level Prior Function Level of Independence: Independent with assistive device(s) Gait / Transfers Assistance Needed: Uses cane for ambulation into bathroom.  ADL's / Homemaking Assistance Needed: reports that he was independent with ADLs, dauhgter provided meals and grocery shopping. pt does not drive Comments: Uses cane for ambulation into bathroom. Rollator outside of home   Self Care: Did the patient need help bathing, dressing, using the toilet or eating? Independent  Indoor Mobility: Did the patient need assistance with walking from room to room (with or without device)? Independent  Stairs: Did the patient need assistance with internal or external stairs (with or without device)? Dependent  Functional Cognition: Did the patient need help planning regular tasks such as shopping or remembering to take medications? Needed some help, took meds once loaded in pill box by daughter and paid bills  together   Current Functional Level Cognition  Arousal/Alertness: Awake/alert Overall Cognitive Status: Impaired/Different from baseline Current Attention Level: Sustained Orientation Level: Oriented to person, Oriented to place, Disoriented to time, Disoriented to situation Following Commands: Follows one step commands with increased time Safety/Judgement: Decreased awareness of safety, Decreased awareness of deficits General Comments: Pt reporting month is "October" and year is 2018 when given multiple choice; requires multimodal cues and increased time to follow instructions, suspect increased need for multimodal cues also due to visual deficits; decreased motor planning noted  Attention: Sustained Sustained Attention: Impaired Sustained Attention Impairment: Verbal basic, Functional basic Memory: Impaired Memory Impairment: Decreased recall of new information Awareness: Appears intact Behaviors: Perseveration Safety/Judgment: Impaired    Extremity Assessment (includes Sensation/Coordination)  Upper Extremity Assessment: Defer to OT evaluation RUE Coordination: decreased fine motor  Lower Extremity Assessment: RLE deficits/detail, Difficult to assess due to impaired cognition, LLE deficits/detail RLE Deficits / Details: hip 4/5, knee 5/5, ankle 3-/5 RLE Sensation: decreased light touch, decreased proprioception LLE Deficits / Details: hip 4/5, knee 4/5, ankle 3-/5 LLE Sensation: decreased light touch, decreased proprioception    ADLs  Overall ADL's : Needs assistance/impaired Eating/Feeding: Set up, Sitting, Bed level Grooming: Minimal assistance, Standing, Cueing for safety Upper Body Bathing: Min guard, Sitting Lower Body Bathing: Moderate assistance Upper Body Dressing : Min guard, Sitting Lower Body Dressing: Moderate assistance Lower Body Dressing Details (indicate cue type and reason): pt reaching to adjust socks seated EOB Toilet Transfer: Minimal assistance, BSC, RW,  Cueing for safety Toileting- Clothing Manipulation and Hygiene: Minimal assistance, Sit to/from stand Functional mobility during ADLs: Minimal assistance, Cueing for safety General ADL Comments: pt performing bed mobility, sitting EOB approx 15 min with supervision for static sitting balace; further assessed vision while sitting EOB including trial of partial occlusion     Mobility  Overal bed mobility: Modified Independent Bed Mobility: Sit to Supine Supine to sit: Min guard, HOB elevated Sit to supine: Modified independent (Device/Increase time) General bed mobility comments: PT assists with management of lines    Transfers  Overall transfer level: Needs assistance Equipment used: Rolling walker (2 wheeled) Transfers: Sit to/from Stand Sit to Stand: Min guard Stand pivot transfers: Mod assist General transfer comment: Uses hands on chair for rise, with eventual slow and safe transition to hands on RW     Ambulation / Gait / Stairs / Wheelchair Mobility  Ambulation/Gait Ambulation/Gait assistance: Mod assist Ambulation Distance (Feet): 200 Feet Assistive device: Rolling walker (2 wheeled) Gait Pattern/deviations: Step-to pattern, Shuffle, Decreased weight shift to right, Decreased weight shift to left, Wide base of support, Ataxic, Festinating, Leaning posteriorly General Gait  Details: Shuffling at gait initiation, wide based, with decreased foot clearance, difficulty managing straight plain AMB and RW management with out mod-maxA for A/E, poor navigation around obstacles in Right visual field.  Gait velocity: 0.71m/s (>0.12m/s in Nov 2018)  Gait velocity interpretation: Below normal speed for age/gender    Posture / Balance Dynamic Sitting Balance Sitting balance - Comments: Good static sitting balance.  Balance Overall balance assessment: Needs assistance Sitting-balance support: Bilateral upper extremity supported, No upper extremity supported Sitting balance-Leahy Scale:  Good Sitting balance - Comments: Good static sitting balance.  Standing balance support: Bilateral upper extremity supported, During functional activity Standing balance-Leahy Scale: Poor Standing balance comment: required external support to maintain dynamic standing High level balance activites: Side stepping, Direction changes, Turns, Backward walking(LOB with all above. )    Special needs/care consideration BiPAP/CPAP: No CPM: No Continuous Drip IV: No Dialysis: No         Life Vest: No Oxygen: No Special Bed: No Trach Size: No Wound Vac (area): No       Skin: Dry, scar on right side of neck                                Bowel mgmt: None documented since admission on 02/17/18 Bladder mgmt: Continent, but has an external catheter; TBD Diabetic mgmt: HgbA1c 4.8     Previous Home Environment Living Arrangements: Alone  Lives With: Alone Available Help at Discharge: Family, Available 24 hours/day Type of Home: Apartment Home Layout: One level Home Access: Level entry Bathroom Shower/Tub: Health visitor: Standard Bathroom Accessibility: Yes How Accessible: Accessible via walker Home Care Services: No Additional Comments: Reports he has a daughter lives nearby who can assist him  Discharge Living Setting Plans for Discharge Living Setting: Other (Comment)(Daughter's home priro to transition back to his apt.) Type of Home at Discharge: House Discharge Home Layout: Two level, Able to live on main level with bedroom/bathroom Discharge Home Access: Level entry(threshold ) Discharge Bathroom Shower/Tub: Tub/shower unit Discharge Bathroom Toilet: Standard Discharge Bathroom Accessibility: Yes How Accessible: Accessible via walker Does the patient have any problems obtaining your medications?: No  Social/Family/Support Systems Patient Roles: Parent, Other (Comment)(Sibling ) Contact Information: Daughter: Bard Herbert  Anticipated Caregiver: daughter and her spouse   Anticipated Caregiver's Contact Information: [phone] Ability/Limitations of Caregiver: she works days, spouse able to assist  Caregiver Availability: 24/7 Discharge Plan Discussed with Primary Caregiver: Yes Is Caregiver In Agreement with Plan?: Yes Does Caregiver/Family have Issues with Lodging/Transportation while Pt is in Rehab?: No  Goals/Additional Needs Patient/Family Goal for Rehab: UJ:[id number] A; OT/SLP:Supervision- Expected length of stay: 12-16 days Cultural Considerations: None Dietary Needs: Carb Mod. diet restrictions  Equipment Needs: TBD Pt/Family Agrees to Admission and willing to participate: Yes Program Orientation Provided & Reviewed with Pt/Caregiver Including Roles  & Responsibilities: Yes  Decrease burden of Care through IP rehab admission: No  Possible need for SNF placement upon discharge: Potentially   Patient Condition: This patient's medical and functional status has changed since the consult dated 02/19/18 in which the Rehabilitation Physician determined and documented that the patient was potentially appropriate for intensive rehabilitative care in an inpatient rehabilitation facility. Issues have been addressed and update has been discussed with Dr. Wynn Banker  and patient now appropriate for inpatient rehabilitation. Will admit to inpatient rehab today.   Preadmission Screen Completed By:  Fae Pippin, 02/20/2018 2:30 PM ______________________________________________________________________   Discussed status with  Dr. Wynn Banker on 02/20/18 at 1435 and received telephone approval for admission today.  Admission Coordinator:  Fae Pippin, time 1435/Date 02/20/18

## 2018-02-20 NOTE — Discharge Summary (Signed)
Physician Discharge Summary  Nicholas [name] [name]:[id number] DOB: Jul 22, 1951 DOA: 02/17/2018  PCP: Nicholas Able, MD  Admit date: 02/17/2018 Discharge date: 02/20/2018  Admitted From: Home Disposition: CIR  Recommendations for Outpatient Follow-up:  1. Follow up with CIR provider at her earliest convenience 2. Follow-up with cardiology and electrophysiology in 1-2 weeks 3. Follow-up with neurology in 4-6 weeks   Home Health: No Equipment/Devices: None  Discharge Condition: Stable CODE STATUS: Full Diet recommendation: Heart Healthy   Brief/Interim Summary: 67 year old male with history of hypertension, hyperlipidemia, SVT, CAD with PCI to LAD, PAD status post left femoropopliteal bypass and right CEA, CVA presented on 02/17/2018 with amnesia and vision loss in the right eye.  Imaging with an MRI revealed a stroke.  Neurology was consulted.  Patient had TEE which showed severe aortic stenosis.  Cardiology was consulted.  Cardiology recommended outpatient follow-up with cardiology and electrophysiology.  Patient will be discharged to CIR.    Discharge Diagnoses:  Principal Problem:   CVA (cerebral vascular accident) (HCC) Active Problems:   Hyperlipidemia   TOBACCO ABUSE   Essential hypertension   CAD, NATIVE VESSEL   PSVT (paroxysmal supraventricular tachycardia) (HCC)   PVD (peripheral vascular disease) (HCC)   Elevated troponin   Stenosis of left carotid artery   Carotid occlusion, right   Diabetes mellitus (HCC)   Urinary retention   Diabetes mellitus type 2 in nonobese (HCC)   Acute blood loss anemia   Diastolic dysfunction   Nonrheumatic aortic valve stenosis   Aortic stenosis  CVA with h/o CVA in the past -OZH:YQMVH large nonhemorrhagic temporal occipital lobe/PCA territory infarct. Additional small acute posterior circulation and posterior watershed infarcts. - CTA head and neck:Left posterior cerebral artery P2 segment occlusion. -Stable right common  carotid and internal carotid artery occlusion -Calcified plaque of left carotid bifurcation with mild 50% distal CCA stenosis. -Echo showed EF of 60-65% with grade 1 diastolic dysfunction - A1c4.8 -Continue aspirin 325 mg daily, Plavix 75 mg daily until 02/23/2018.  Cardiology is recommending to start NOAC if ok with neurology as there is a question of Afib in the past. Patient will be switched to oral Eliquis 5 mg twice a day along with aspirin 81 mg daily from 02/23/18 onwards as per neurology recommendations.  Continue  Crestor -Outpatient follow-up with neurology -PT/OT recommending CIR.   discharge to CIR today  Severe aortic stenosis on TEE -Cardiology evaluation appreciated.  Outpatient follow-up with cardiology  Essential hypertension and SVT -Restart diltiazem and metoprolol.  Digoxin will be discontinued as per cardiology recommendations.  Hydralazine will be on hold for now and this can be started her blood pressure remains elevated.  Outpatient follow-up with electrophysiology  Elevated troponin - mildelevationwith flat trend -no need for further work up  PAD - h/o R CEA and left Fem-pop bypass graft  CAD  - h/o PCI to LAD -Outpatient follow-up    Discharge Instructions  Discharge Instructions    Ambulatory referral to Cardiac Electrophysiology   Complete by:  As directed    Recent stroke; history of atrial arrhythmia?   Ambulatory referral to Cardiology   Complete by:  As directed    Severe AS   Ambulatory referral to Neurology   Complete by:  As directed    An appointment is requested in approximately: 6 weeks Follow up with stroke clinic NP (Jessica Vanschaick or Darrol Angel, if both not available, consider Manson Allan, or Ahern) at [facility] in about 4 weeks. Thanks.   Call MD for:  difficulty breathing, headache or visual disturbances   Complete by:  As directed    Call MD for:  extreme fatigue   Complete by:  As directed    Call MD for:  hives    Complete by:  As directed    Call MD for:  persistant dizziness or light-headedness   Complete by:  As directed    Call MD for:  persistant nausea and vomiting   Complete by:  As directed    Call MD for:  severe uncontrolled pain   Complete by:  As directed    Call MD for:  temperature >100.4   Complete by:  As directed    Diet - low sodium heart healthy   Complete by:  As directed    Discharge instructions   Complete by:  As directed    Diet as per SLP recommendations   Increase activity slowly   Complete by:  As directed      Allergies as of 02/20/2018   No Known Allergies     Medication List    STOP taking these medications   digoxin 0.25 MG tablet Commonly known as:  LANOXIN   hydrALAZINE 10 MG tablet Commonly known as:  APRESOLINE   mineral oil enema   polyethylene glycol packet Commonly known as:  MIRALAX / GLYCOLAX     TAKE these medications   apixaban 5 MG Tabs tablet Commonly known as:  ELIQUIS Take 1 tablet (5 mg total) by mouth 2 (two) times daily. Start taking on:  02/23/2018   aspirin 325 MG EC tablet Take 1 tablet (325 mg total) by mouth daily for 2 days. Switch to aspirin 81 mg daily from 02/23/2018 onwards Start taking on:  02/21/2018   clopidogrel 75 MG tablet Commonly known as:  PLAVIX Take 1 tablet (75 mg total) by mouth daily with breakfast. Stop Plavix on 02/23/2018 when Eliquis will be started What changed:  additional instructions   diltiazem 360 MG 24 hr capsule Commonly known as:  TAZTIA XT Take 1 capsule (360 mg total) by mouth daily.   doxazosin 2 MG tablet Commonly known as:  CARDURA Take 1 tablet (2 mg total) by mouth daily.   metoprolol tartrate 50 MG tablet Commonly known as:  LOPRESSOR Take 1 tablet (50 mg total) by mouth 2 (two) times daily.   rosuvastatin 10 MG tablet Commonly known as:  CRESTOR Take 1 tablet (10 mg total) by mouth daily.   senna-docusate 8.6-50 MG tablet Commonly known as:  Senokot-S Take 1 tablet  by mouth at bedtime as needed for mild constipation.      Follow-up Information    Nicholas Able, MD Follow up.   Specialty:  Family Medicine Contact information: [address] [place] [address] [phone]        Micki Riley, MD. Schedule an appointment as soon as possible for a visit in 6 week(s).   Specialties:  Neurology, Radiology Contact information: [address] [place] [address] [phone]        Tonny Bollman, MD. Schedule an appointment as soon as possible for a visit in 1 week(s).   Specialty:  Cardiology Contact information: [address] [place] [address] [phone]          No Known Allergies  Consultations:  Cardiology and neurology   Procedures/Studies: Ct Angio Head W Or Wo Contrast  Result Date: 02/18/2018 CLINICAL DATA:  67 y/o M; confusion and stroke. History of left  carotid artery stenosis, right carotid artery endarterectomy, carotid occlusion. EXAM: CT ANGIOGRAPHY HEAD AND NECK TECHNIQUE: Multidetector CT imaging of the head and neck was performed using the standard protocol during bolus administration of intravenous contrast. Multiplanar CT image reconstructions and MIPs were obtained to evaluate the vascular anatomy. Carotid stenosis measurements (when applicable) are obtained utilizing NASCET criteria, using the distal internal carotid diameter as the denominator. CONTRAST:  50mL ISOVUE-370 IOPAMIDOL (ISOVUE-370) INJECTION 76% COMPARISON:  02/17/2018 MRI of the head. 03/11/2017 MRA of the head. FINDINGS: CT HEAD FINDINGS Brain: Multiple small areas of hypodensity within the cerebellum and large left PCA area distribution acute infarctions as seen on MRI of the head. No interval hemorrhage identified. Large right and small left chronic parietal infarctions in bilateral chronic ACA distribution infarctions. Stable chronic microvascular ischemic changes and parenchymal volume loss of the  brain. No hydrocephalus, extra-axial collection, or effacement of basilar cisterns. Vascular: As below. Skull: Normal. Negative for fracture or focal lesion. Sinuses: Mild paranasal sinus mucosal thickening greatest in the ethmoid air cells. Normal aeration of mastoid air cells. Orbits are unremarkable. Orbits: No acute finding. Review of the MIP images confirms the above findings CTA NECK FINDINGS Aortic arch: Bovine variant branching. Imaged portion shows no evidence of aneurysm or dissection. No significant stenosis of the major arch vessel origins. Mild calcific atherosclerosis. Right carotid system: Proximal occlusion of right common carotid artery and complete occlusion of right internal carotid artery. Right external carotid branches are patent likely due to retrograde flow and collateralization. Chronic postsurgical changes related to prior right carotid endarterectomy. Left carotid system: Dense calcification of the carotid bifurcation with mild 50% common carotid stenosis just proximal to the bifurcation. No dissection or occlusion. Vertebral arteries: Codominant. No evidence of dissection, stenosis (50% or greater) or occlusion. 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 OPLL and degenerative changes results in moderate to severe C5-6 canal stenosis. Other neck: Negative. Upper chest: Negative. Review of the MIP images confirms the above findings CTA HEAD FINDINGS Anterior circulation: Occluded right ICA petrous, cavernous, and paraclinoid segments. Right ICA terminal segment is patent. Otherwise no large vessel occlusion, aneurysm, or high-grade stenosis. Calcific atherosclerosis of the left carotid siphon with mild less than 50% stenosis. Posterior circulation: Left P2 occlusion (series 12, image 140). Otherwise no large vessel occlusion, stenosis, or aneurysm in the posterior circulation identified. Venous sinuses: As  permitted by contrast timing, patent. Anatomic variants: Complete circle-of-Willis. Delayed phase: No abnormal intracranial enhancement. Review of the MIP images confirms the above findings IMPRESSION: 1. Left posterior cerebral artery P2 segment occlusion. 2. Stable right common carotid and internal carotid artery occlusion to the terminus. Patent right ICA terminus. 3. Otherwise patent anterior and posterior intracranial circulation without large vessel occlusion, aneurysm, stenosis, or vascular malformation. 4. Patent left carotid system. Calcified plaque of left carotid bifurcation with mild 50% distal CCA stenosis. 5. Patent right dominant vertebrobasilar system. No significant stenosis, aneurysm, or dissection. 6. Acute infarcts in cerebellum and left PCA distribution as seen on MRI. No acute hemorrhage. 7. Multiple chronic infarcts, parenchymal volume loss, and chronic microvascular ischemic changes of the brain as above. 8. Cervical spondylosis and OPLL with moderate to severe C5-6 canal stenosis. These results were called by telephone at the time of interpretation on 02/18/2018 at 12:47 am to Dr. Therisa DoyneANASTASSIA DOUTOVA , who verbally acknowledged these results. Electronically Signed   By: Mitzi HansenLance  Furusawa-Stratton M.D.   On: 02/18/2018 00:47   Dg  Chest 2 View  Result Date: 02/18/2018 CLINICAL DATA:  Status post CVA.  Acute onset of visual disturbance. EXAM: CHEST - 2 VIEW COMPARISON:  With chest radiograph performed 10/17/2017 FINDINGS: The lungs are well-aerated and clear. There is no evidence of focal opacification, pleural effusion or pneumothorax. The heart is normal in size; the mediastinal contour is within normal limits. No acute osseous abnormalities are seen. IMPRESSION: No acute cardiopulmonary process seen. Electronically Signed   By: Roanna Raider M.D.   On: 02/18/2018 00:58   Ct Angio Neck W Or Wo Contrast  Result Date: 02/18/2018 CLINICAL DATA:  67 y/o M; confusion and stroke. History of  left carotid artery stenosis, right carotid artery endarterectomy, carotid occlusion. EXAM: CT ANGIOGRAPHY HEAD AND NECK TECHNIQUE: Multidetector CT imaging of the head and neck was performed using the standard protocol during bolus administration of intravenous contrast. Multiplanar CT image reconstructions and MIPs were obtained to evaluate the vascular anatomy. Carotid stenosis measurements (when applicable) are obtained utilizing NASCET criteria, using the distal internal carotid diameter as the denominator. CONTRAST:  50mL ISOVUE-370 IOPAMIDOL (ISOVUE-370) INJECTION 76% COMPARISON:  02/17/2018 MRI of the head. 03/11/2017 MRA of the head. FINDINGS: CT HEAD FINDINGS Brain: Multiple small areas of hypodensity within the cerebellum and large left PCA area distribution acute infarctions as seen on MRI of the head. No interval hemorrhage identified. Large right and small left chronic parietal infarctions in bilateral chronic ACA distribution infarctions. Stable chronic microvascular ischemic changes and parenchymal volume loss of the brain. No hydrocephalus, extra-axial collection, or effacement of basilar cisterns. Vascular: As below. Skull: Normal. Negative for fracture or focal lesion. Sinuses: Mild paranasal sinus mucosal thickening greatest in the ethmoid air cells. Normal aeration of mastoid air cells. Orbits are unremarkable. Orbits: No acute finding. Review of the MIP images confirms the above findings CTA NECK FINDINGS Aortic arch: Bovine variant branching. Imaged portion shows no evidence of aneurysm or dissection. No significant stenosis of the major arch vessel origins. Mild calcific atherosclerosis. Right carotid system: Proximal occlusion of right common carotid artery and complete occlusion of right internal carotid artery. Right external carotid branches are patent likely due to retrograde flow and collateralization. Chronic postsurgical changes related to prior right carotid endarterectomy. Left  carotid system: Dense calcification of the carotid bifurcation with mild 50% common carotid stenosis just proximal to the bifurcation. No dissection or occlusion. Vertebral arteries: Codominant. No evidence of dissection, stenosis (50% or greater) or occlusion. 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 OPLL and degenerative changes results in moderate to severe C5-6 canal stenosis. Other neck: Negative. Upper chest: Negative. Review of the MIP images confirms the above findings CTA HEAD FINDINGS Anterior circulation: Occluded right ICA petrous, cavernous, and paraclinoid segments. Right ICA terminal segment is patent. Otherwise no large vessel occlusion, aneurysm, or high-grade stenosis. Calcific atherosclerosis of the left carotid siphon with mild less than 50% stenosis. Posterior circulation: Left P2 occlusion (series 12, image 140). Otherwise no large vessel occlusion, stenosis, or aneurysm in the posterior circulation identified. Venous sinuses: As permitted by contrast timing, patent. Anatomic variants: Complete circle-of-Willis. Delayed phase: No abnormal intracranial enhancement. Review of the MIP images confirms the above findings IMPRESSION: 1. Left posterior cerebral artery P2 segment occlusion. 2. Stable right common carotid and internal carotid artery occlusion to the terminus. Patent right ICA terminus. 3. Otherwise patent anterior and posterior intracranial circulation without large vessel occlusion, aneurysm, stenosis, or vascular malformation. 4. Patent left carotid  system. Calcified plaque of left carotid bifurcation with mild 50% distal CCA stenosis. 5. Patent right dominant vertebrobasilar system. No significant stenosis, aneurysm, or dissection. 6. Acute infarcts in cerebellum and left PCA distribution as seen on MRI. No acute hemorrhage. 7. Multiple chronic infarcts, parenchymal volume loss, and chronic  microvascular ischemic changes of the brain as above. 8. Cervical spondylosis and OPLL with moderate to severe C5-6 canal stenosis. These results were called by telephone at the time of interpretation on 02/18/2018 at 12:47 am to Dr. Therisa Doyne , who verbally acknowledged these results. Electronically Signed   By: Mitzi Hansen M.D.   On: 02/18/2018 00:47   Mr Brain Wo Contrast  Result Date: 02/17/2018 CLINICAL DATA:  Amnesia today and vision loss. Suspect stroke. History of hypertension, diabetes, status post RIGHT carotid endarterectomy. EXAM: MRI HEAD WITHOUT CONTRAST TECHNIQUE: Multiplanar, multiecho pulse sequences of the brain and surrounding structures were obtained without intravenous contrast. COMPARISON:  MRI of the head March 11, 2017 FINDINGS: INTRACRANIAL CONTENTS: Confluent reduced diffusion LEFT mesial temporal occipital lobe with low ADC values. Small sub foci of reduced diffusion bilateral cerebellum and RIGHT occipital lobe. Regional mass effect without midline shift. Punctate focus of reduced diffusion LEFT mesial thalamus, bilateral mesial parietal lobes. No susceptibility artifact to suggest hemorrhage. Mild susceptibility artifact associated with biparietal and bifrontal infarcts. Mild parenchymal brain volume. No abnormal extra-axial loss. No hydrocephalus fluid collections. VASCULAR: Chronically occluded RIGHT internal carotid artery. Remaining major flow voids preserved. SKULL AND UPPER CERVICAL SPINE: No abnormal sellar expansion. No suspicious calvarial bone marrow signal. Craniocervical junction maintained. SINUSES/ORBITS: Moderate paranasal sinus mucosal thickening without air-fluid levels. Mastoid air cells are well aerated.The included ocular globes and orbital contents are non-suspicious. OTHER: None. IMPRESSION: 1. Acute large nonhemorrhagic temporal occipital lobe/PCA territory infarct. Additional small acute posterior circulation and posterior watershed  infarcts. 2. Old bifrontal and biparietal lobe infarcts in ACA and MCA territories. 3. Chronically occluded RIGHT internal carotid artery. 4. Critical Value/emergent results were called by telephone at the time of interpretation on 02/17/2018 at 8:25 pm to Dr. Azalia Bilis , who verbally acknowledged these results. Electronically Signed   By: Awilda Metro M.D.   On: 02/17/2018 20:28     Echo Study Conclusions  - Left ventricle: The cavity size was normal. Wall thickness was increased in a pattern of moderate LVH. Systolic function was normal. The estimated ejection fraction was in the range of 60% to 65%. Wall motion was normal; there were no regional wall motion abnormalities. Doppler parameters are consistent with abnormal left ventricular relaxation (grade 1 diastolic dysfunction). - Aortic valve: Functionally bicuspid; severely thickened, severely calcified leaflets. Valve mobility was restricted. There was severe stenosis. There was mild regurgitation. Valve area (VTI): 0.81 cm^2. Valve area (Vmax): 0.75 cm^2. Valve area (Vmean): 0.87 cm^2. - Mitral valve: Mild prolapse, involving the anterior leaflet. - Left atrium: The atrium was mildly dilated.  TEE on 03/08/2018  Findings:   - No embolic source  - SEVERE AORTIC STENOSIS (4.2-5.21m/s peak velocity with severe thickening and calcification of trileaflet aortic valve)  - Trace MR  - Mild TR  - Normal EF 55%    Subjective: Patient seen and examined at bedside.  No overnight fever, nausea or vomiting.  He is tolerating diet.  Discharge Exam: Vitals:   02/20/18 0800 02/20/18 1128  BP:  (!) 132/49  Pulse: 74   Resp: 19   Temp:  98.5 F (36.9 C)  SpO2: 98%    Vitals:  02/20/18 0435 02/20/18 0739 02/20/18 0800 02/20/18 1128  BP: (!) 143/44 (!) 115/52  (!) 132/49  Pulse: 74  74   Resp: 18  19   Temp: 98.6 F (37 C) (!) 97.4 F (36.3 C)  98.5 F (36.9 C)  TempSrc: Oral Oral  Oral  SpO2:  99%  98%   Weight:      Height:        General: Pt is alert, awake, not in acute distress Cardiovascular: Rate controlled, S1/S2 + Respiratory: Bilateral decreased breath sounds at bases  abdominal: Soft, NT, ND, bowel sounds + Extremities: no edema, no cyanosis    The results of significant diagnostics from this hospitalization (including imaging, microbiology, ancillary and laboratory) are listed below for reference.     Microbiology: No results found for this or any previous visit (from the past 240 hour(s)).   Labs: BNP (last 3 results) No results for input(s): BNP in the last 8760 hours. Basic Metabolic Panel: Recent Labs  Lab 02/17/18 1609 02/18/18 0810 02/20/18 0428  NA 140 138 137  K 5.2* 3.7 3.8  CL 104 103 101  CO2 26 28 29   GLUCOSE 86 76 81  BUN 13 9 11   CREATININE 1.03 0.86 1.03  CALCIUM 9.5 9.0 8.7*  MG  --   --  1.9   Liver Function Tests: Recent Labs  Lab 02/17/18 1609  AST 45*  ALT 49  ALKPHOS 81  BILITOT 1.8*  PROT 6.3*  ALBUMIN 3.9   No results for input(s): LIPASE, AMYLASE in the last 168 hours. No results for input(s): AMMONIA in the last 168 hours. CBC: Recent Labs  Lab 02/17/18 1609 02/20/18 0428  WBC 7.5 13.4*  NEUTROABS  --  10.2*  HGB 12.4* 12.0*  HCT 36.4* 34.9*  MCV 101.4* 100.6*  PLT 285 265   Cardiac Enzymes: Recent Labs  Lab 02/18/18 0032 02/18/18 0549 02/18/18 1214 02/18/18 1822  TROPONINI 0.06* 0.06* 0.07* 0.08*   BNP: Invalid input(s): POCBNP CBG: Recent Labs  Lab 02/19/18 1631 02/19/18 2116 02/20/18 0617 02/20/18 0734 02/20/18 1108  GLUCAP 68 117* 74 120* 78   D-Dimer No results for input(s): DDIMER in the last 72 hours. Hgb A1c Recent Labs    02/18/18 0549  HGBA1C 4.8   Lipid Profile Recent Labs    02/18/18 0549  CHOL 94  HDL 50  LDLCALC 40  TRIG 19  CHOLHDL 1.9   Thyroid function studies No results for input(s): TSH, T4TOTAL, T3FREE, THYROIDAB in the last 72 hours.  Invalid  input(s): FREET3 Anemia work up No results for input(s): VITAMINB12, FOLATE, FERRITIN, TIBC, IRON, RETICCTPCT in the last 72 hours. Urinalysis    Component Value Date/Time   COLORURINE YELLOW 02/17/2018 2110   APPEARANCEUR CLEAR 02/17/2018 2110   LABSPEC 1.014 02/17/2018 2110   PHURINE 8.0 02/17/2018 2110   GLUCOSEU NEGATIVE 02/17/2018 2110   HGBUR NEGATIVE 02/17/2018 2110   BILIRUBINUR NEGATIVE 02/17/2018 2110   KETONESUR NEGATIVE 02/17/2018 2110   PROTEINUR NEGATIVE 02/17/2018 2110   UROBILINOGEN 0.2 10/09/2015 1232   NITRITE NEGATIVE 02/17/2018 2110   LEUKOCYTESUR NEGATIVE 02/17/2018 2110   Sepsis Labs Invalid input(s): PROCALCITONIN,  WBC,  LACTICIDVEN Microbiology No results found for this or any previous visit (from the past 240 hour(s)).   Time coordinating discharge: 35 minutes  SIGNED:   Glade Lloyd, MD  [facility] 02/20/2018, 2:25 PM Pager: [phone]  If 7PM-7AM, please contact night-coverage [email or web address] Password [email or web address]

## 2018-02-20 NOTE — Progress Notes (Addendum)
Inpatient Rehabilitation  Met with patient at bedside to discuss team's recommendation for IP Rehab.  Shared booklets, insurance verification letter, and answered initial questions.  Called daughter, Darnelle Bos at [phone] and left a voicemail with request for her to call me back and discuss post acute rehab options.  Plan to follow for timing of medical readiness, patient/family decision, and IP Rehab bed availability.  Call if questions.    Update: Discussed case with daughter, Darnelle Bos and she prefers IP Rehab in order to maximize her father's independence.  Hopeful that he can stay with her and her spouse short term prior to returning to his apartment.  She did express realistic concerns about his double vision and the impact of that on his independence.    I await updated therapy notes for tolerance as well as timing of medical readiness prior to hopeful admission. Cal if questions.      Carmelia Roller., CCC/SLP Admission Coordinator  [facility]  Cell [phone]

## 2018-02-20 NOTE — Progress Notes (Addendum)
Pt discharge to CIR and [facility]. Pt telemetry removed; IV remains intact; pt condom cath remains on and intact. Pt to be transported off unit via bed with belongings to the side. Dionne BucyP. Amo Anslie Spadafora RN

## 2018-02-21 ENCOUNTER — Inpatient Hospital Stay (HOSPITAL_COMMUNITY): Payer: Medicare Other | Admitting: Speech Pathology

## 2018-02-21 ENCOUNTER — Inpatient Hospital Stay (HOSPITAL_COMMUNITY): Payer: Medicare Other | Admitting: Physical Therapy

## 2018-02-21 ENCOUNTER — Inpatient Hospital Stay (HOSPITAL_COMMUNITY): Payer: Medicare Other | Admitting: Occupational Therapy

## 2018-02-21 LAB — CBC WITH DIFFERENTIAL/PLATELET
Basophils Absolute: 0 10*3/uL (ref 0.0–0.1)
Basophils Relative: 0 %
EOS ABS: 0.3 10*3/uL (ref 0.0–0.7)
Eosinophils Relative: 3 %
HEMATOCRIT: 33.9 % — AB (ref 39.0–52.0)
HEMOGLOBIN: 11.6 g/dL — AB (ref 13.0–17.0)
LYMPHS ABS: 1.9 10*3/uL (ref 0.7–4.0)
LYMPHS PCT: 20 %
MCH: 34.2 pg — AB (ref 26.0–34.0)
MCHC: 34.2 g/dL (ref 30.0–36.0)
MCV: 100 fL (ref 78.0–100.0)
MONOS PCT: 10 %
Monocytes Absolute: 0.9 10*3/uL (ref 0.1–1.0)
NEUTROS PCT: 67 %
Neutro Abs: 6.4 10*3/uL (ref 1.7–7.7)
Platelets: 273 10*3/uL (ref 150–400)
RBC: 3.39 MIL/uL — ABNORMAL LOW (ref 4.22–5.81)
RDW: 12.4 % (ref 11.5–15.5)
WBC: 9.5 10*3/uL (ref 4.0–10.5)

## 2018-02-21 LAB — COMPREHENSIVE METABOLIC PANEL
ALK PHOS: 81 U/L (ref 38–126)
ALT: 40 U/L (ref 17–63)
ANION GAP: 7 (ref 5–15)
AST: 30 U/L (ref 15–41)
Albumin: 3.1 g/dL — ABNORMAL LOW (ref 3.5–5.0)
BILIRUBIN TOTAL: 1.6 mg/dL — AB (ref 0.3–1.2)
BUN: 15 mg/dL (ref 6–20)
CO2: 30 mmol/L (ref 22–32)
CREATININE: 1.05 mg/dL (ref 0.61–1.24)
Calcium: 9 mg/dL (ref 8.9–10.3)
Chloride: 104 mmol/L (ref 101–111)
Glucose, Bld: 77 mg/dL (ref 65–99)
Potassium: 4.1 mmol/L (ref 3.5–5.1)
Sodium: 141 mmol/L (ref 135–145)
TOTAL PROTEIN: 5.7 g/dL — AB (ref 6.5–8.1)

## 2018-02-21 LAB — GLUCOSE, CAPILLARY
GLUCOSE-CAPILLARY: 66 mg/dL (ref 65–99)
Glucose-Capillary: 76 mg/dL (ref 65–99)
Glucose-Capillary: 115 mg/dL — ABNORMAL HIGH (ref 65–99)

## 2018-02-21 MED ORDER — CLOPIDOGREL BISULFATE 75 MG PO TABS
75.0000 mg | ORAL_TABLET | Freq: Every day | ORAL | Status: AC
  Administered 2018-02-21 – 2018-02-22 (×2): 75 mg via ORAL
  Filled 2018-02-21: qty 1

## 2018-02-21 MED ORDER — ASPIRIN EC 81 MG PO TBEC
81.0000 mg | DELAYED_RELEASE_TABLET | Freq: Every day | ORAL | Status: DC
  Administered 2018-02-23 – 2018-03-07 (×13): 81 mg via ORAL
  Filled 2018-02-21 (×13): qty 1

## 2018-02-21 NOTE — Evaluation (Signed)
Occupational Therapy Assessment and Plan  Patient Details  Name: Nicholas [name] MRN: [id number] Date of Birth: [birth year]  OT Diagnosis: abnormal posture, ataxia, cognitive deficits, disturbance of vision, hemiplegia affecting dominant side, muscle weakness (generalized) and coordination disorder Rehab Potential: Rehab Potential (ACUTE ONLY): Good ELOS: 14-16 days   Today's Date: 02/21/2018 OT Individual Time: 4742-5956 OT Individual Time Calculation (min): 61 min     Problem List:  Patient Active Problem List   Diagnosis Date Noted  . Aortic stenosis 02/20/2018  . Occipital stroke ([place]) 02/20/2018  . Posterior cerebral artery embolism, left   . Right homonymous hemianopsia   . Gait disturbance, post-stroke   . Acquired left foot drop   . Urinary retention   . Diabetes mellitus type 2 in nonobese (HCC)   . Acute blood loss anemia   . Diastolic dysfunction   . Nonrheumatic aortic valve stenosis   . Elevated troponin 02/18/2018  . Stenosis of left carotid artery   . Carotid occlusion, right   . Diabetes mellitus ([place])   . CVA (cerebral vascular accident) ([place]) 02/17/2018  . Pressure injury of skin 10/28/2017  . SBO (small bowel obstruction) ([place]) 10/17/2017  . Bacterial UTI 03/19/2017  . Adjustment disorder   . Slow transit constipation   . Weakness of both lower extremities   . Bilateral foot-drop   . Lumbar radiculopathy 03/13/2017  . Lumbar spinal stenosis 03/13/2017  . Foot drop, right   . Urinary retention due to benign prostatic hyperplasia   . Benign essential HTN   . History of CVA (cerebrovascular accident) 03/11/2017  . Cognitive deficits, late effect of cerebrovascular disease 07/23/2014  . 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 PSVT (paroxysmal supraventricular tachycardia) ([place]) 09/15/2010  . Aortic valve disorder 08/09/2010  . PVD (peripheral vascular disease) ([place]) 08/09/2010  . Hyperlipidemia 08/08/2010  . TOBACCO ABUSE  08/08/2010  . Essential hypertension 08/08/2010  . CAD, NATIVE VESSEL 08/08/2010  . Occlusion and stenosis of carotid artery 08/08/2010    Past Medical History:  Past Medical History:  Diagnosis Date  . At high risk for falls   . BPH (benign prostatic hyperplasia)   . BPH (benign prostatic hypertrophy)    HX RETENTION  . Carotid artery occlusion    S/P RIGHT CEA  . Coronary artery disease CARDIOLOGIST--  DR Rayann Heman   S/P  PCI TO LAD 1993  . GERD (gastroesophageal reflux disease)   . History of CVA (cerebrovascular accident)    1993---  NO RESIDUALS  . History of head injury    CONCUSSION--  NO RESIDUAL  . History of myocardial infarction    1993--  NON-Q WAVE  S/P PCI TO LAD  . Hydrocele, left   . Hyperlipidemia   . Hypertension   . Left carotid artery stenosis    MILD  . Myocardial infarction ([place])   . PSVT (paroxysmal supraventricular tachycardia) ([place])   . PVD (peripheral vascular disease) ([place])    S/P LEFT FEM-POP  . Right leg weakness    USES CANE--  SECONDARY TO PVD  . SBO (small bowel obstruction) ([place])   . Type 2 diabetes mellitus ([place])   . Wears glasses    Past Surgical History:  Past Surgical History:  Procedure Laterality Date  . CARDIAC CATHETERIZATION  01-18-2003   DR Johnsie Cancel   NON-OBSTRUCTIVE CAD/  PREVIOIS ANGIOPLASTY SITE WIDELY PATENT  . CARDIOVASCULAR STRESS TEST  2007   SMALL ANTERO-APICAL SCAR/  NO ISCHEMIA  . CAROTID ENDARTERECTOMY Right  05-31-2003  . CORONARY ANGIOPLASTY  1993   PCI TO LAD  . FEMORAL-POPLITEAL BYPASS GRAFT Left 01-19-2003  . HYDROCELE EXCISION Left 10/27/2013   Procedure: HYDROCELECTOMY ADULT;  Surgeon: Hanley Ben, MD;  Location[facility];  Service: Urology;  Laterality: Left;  . RIGHT HYDROCELECTOMY  05-31-2003  . TEE WITHOUT CARDIOVERSION N/A 02/19/2018   Procedure: TRANSESOPHAGEAL ECHOCARDIOGRAM (TEE);  Surgeon: Jerline Pain, MD;  Location[facility] ENDOSCOPY;  Service: Cardiovascular;  Laterality: N/A;   . TRANSTHORACIC ECHOCARDIOGRAM  08-24-2010   EF 55%/  MILD AORTIC INSUFFICENCY/  MODERATE LVH  . UMBILICAL HERNIA REPAIR N/A 10/21/2017   Procedure: UMBILICAL HERNIA REPAIR;  Surgeon: Donnie Mesa, MD;  Location: [place];  Service: General;  Laterality: N/A;    Assessment & Plan Clinical Impression: Nicholas Njie Mayfieldis a 67 y.o.right handed malewith history ofCVA and received inpatient rehabilitation services August 2015,, concussion, back injury with left foot drop with lumbar stenosis received inpatient rehabilitation services April 2018,supraventricular tachycardia, CAD/PCI maintained on Plavix, urinary retention, small bowel obstruction, diabetes mellitus. Per chart review,patient lives alone. Used a cane prior to admission.He does not drive.One level home. He has a daughter and brother in the area who work. Presented 02/17/2018 with altered mental status as well as blurred vision. MRIreviewed, showing R>L CVA. Per report,acute large nonhemorrhagic temporal occipital lobe PCA territory infarcts as well as additional small acute posterior circulation and posterior watershed infarcts. Old bifrontal and biparietal lobe infarction the ACA and MCA territories. Chronically occluded right internal carotid artery. Patient did not receive TPA. CT angiogram head and neck showed left posterior cerebral artery P2 segment occlusion. No significant aneurysms stenosis or dissection. Echocardiogram with ejection fraction of 01% grade 1 diastolic dysfunction.Tee 02/19/2018 per Dr Radford Pax with severe aortic stenosisand currently no plan for intervention and he would follow-up with Dr. Burt Knack cardiology services as an outpatient to discuss further care after he recovers from CVA and progressed through rehabilitation. Plan 30 day event monitor as outpatient.Currently maintained on aspirin and Plavix for CVA prophylaxishowever due to large size of stroke in recommended to start Eliquisno sooner than 02/23/2018  to avoid hemorrhagic conversion. Patient can remain on aspirin81 mgat that time and Plavix can be discontinued.Marland Kitchen Physicaland occupationaltherapy evaluationscompleted 02/18/2018 with recommendations of physical medicine rehabilitation consult.patient was admitted for a comprehensive rehab program.  Patient currently requires min- mod with basic self-care skills secondary to muscle weakness, decreased cardiorespiratoy endurance, ataxia, decreased coordination and decreased motor planning, decreased visual perceptual skills, field cut and hemianopsia, decreased midline orientation, decreased attention to right, right side neglect and decreased motor planning, decreased initiation, decreased attention, decreased awareness, decreased problem solving, decreased safety awareness, decreased memory and delayed processing and decreased sitting balance, decreased standing balance, decreased postural control and hemiplegia.  Prior to hospitalization, patient could complete BADLs with modified independent .  Patient will benefit from skilled intervention to increase independence with basic self-care skills prior to discharge home with supervision assist from brother + sister.  Anticipate patient will require 24 hour supervision and follow up home health.  OT - End of Session Endurance Deficit: Yes OT Assessment Rehab Potential (ACUTE ONLY): Good OT Barriers to Discharge: Decreased caregiver support OT Barriers to Discharge Comments: will need 24/7 supervision at d/c OT Patient demonstrates impairments in the following area(s): Balance;Perception;Cognition;Safety;Endurance;Motor;Vision OT Basic ADL's Functional Problem(s): Eating;Grooming;Bathing;Dressing;Toileting OT Advanced ADL's Functional Problem(s): Simple Meal Preparation OT Transfers Functional Problem(s): Toilet;Tub/Shower OT Additional Impairment(s): Fuctional Use of Upper Extremity OT Plan OT Intensity: Minimum of  1-2 x/day, 45 to 90 minutes OT  Frequency: 5 out of 7 days OT Duration/Estimated Length of Stay: 14-16 days OT Treatment/Interventions: Balance/vestibular training;Discharge planning;Functional electrical stimulation;Pain management;Self Care/advanced ADL retraining;Therapeutic Activities;UE/LE Coordination activities;Visual/perceptual remediation/compensation;Therapeutic Exercise;Skin care/wound managment;Patient/family education;Functional mobility training;Disease mangement/prevention;Cognitive remediation/compensation;Community reintegration;DME/adaptive equipment instruction;Neuromuscular re-education;Psychosocial support;UE/LE Strength taining/ROM;Splinting/orthotics;Wheelchair propulsion/positioning OT Self Feeding Anticipated Outcome(s): Supervision/cuing  OT Basic Self-Care Anticipated Outcome(s): Supervision/cuing  OT Toileting Anticipated Outcome(s): Supervision/cuing  OT Bathroom Transfers Anticipated Outcome(s): Supervision/cuing  OT Recommendation Recommendations for Other Services: Speech consult Patient destination: Home Follow Up Recommendations: Home health OT Equipment Recommended: To be determined   Skilled Therapeutic Intervention Skilled OT session completed with focus on initial evaluation, education on OT role/POC, and establishment of patient-centered goals.   Pt greeted supine in bed, head turned to Lt while conversing with therapist. Reported pain was manageable and motivated to shower. Mod A and multimodal cues provided for pt to transfer to Rt side of bed and complete stand pivot<w/c with cues for technique and hand placement. Similar cues provided when transferring to TTB, with manual placement for bilateral UEs on grab bars. He bathed at sit<stand level with overall Min A for buttocks with mod vcs on sequencing due to perseveration tendencies. Decreased proprioceptive awareness on Rt side with vision occluded while washing foot/arm. Significantly increased time and tactile cues required for pt to  motor plan transfer out of shower going towards Rt side. He then dressed EOB with close supervision for sitting balance. Unable to independently scan to Rt for necessary items, and required cues for orientation, sequencing, integrating R UE at dominant level, and safety during sit<stand transitions. Mild Lt lean while standing with improved alignment with vcs. Afterwards he transferred to recliner placed at [facility] with Mod A. He was repositioned for comfort with LEs elevated and safety belt fastened. Educated him on use of call bell for OOB needs with verbalized understanding. He was left with all needs within reach.     OT Evaluation Precautions/Restrictions  Precautions Precautions: Fall Precaution Comments: Rt visual field cut/Rt inattention  Restrictions Weight Bearing Restrictions: No General Chart Reviewed: Yes Family/Caregiver Present: No  Pain Pain Assessment Pain Assessment: No/denies pain Home Living/Prior Functioning Home Living Family/patient expects to be discharged to:: Private residence Living Arrangements: Alone Available Help at Discharge: Available PRN/intermittently, Family Type of Home: Apartment Home Access: Level entry  Home Layout: One level Bathroom Shower/Tub: Walk-in shower(with TTB) Bathroom Toilet: Standard Bathroom Accessibility: Yes  Lives With: Alone IADL History Homemaking Responsibilities: No(Dtr provides meals + Dtr/brother provide driving assistance ) Occupation: On disability Leisure and Hobbies: Pt reports he has no hobbies  Prior Function Level of Independence: Independent with basic ADLs, Independent with transfers, Requires assistive device for independence  Able to Take Stairs?: Yes Driving: No Leisure: Hobbies-no Comments: Uses cane for ambulation ADL ADL ADL Comments: Please see functional navigator for ADL status Vision Baseline Vision/History: Wears glasses Wears Glasses: At all times Patient Visual Report: Blurring of  vision;Peripheral vision impairment Vision Assessment?: Vision impaired- to be further tested in functional context(Rt homonymous hemianopsia ) Perception  Perception: Impaired(Rt inattention/neglect) Praxis Praxis: Impaired Praxis Impairment Details: Initiation;Motor planning Cognition Overall Cognitive Status: No family/caregiver present to determine baseline cognitive functioning Arousal/Alertness: Awake/alert Orientation Level: Person;Place;Situation Person: Oriented Place: Oriented Situation: Disoriented Year: 2016 Month: January Day of Week: Correct Memory: Impaired Memory Impairment: Decreased recall of new information Immediate Memory Recall: Sock;Blue;Bed Memory Recall: (0/3 recall with cues) Attention: Sustained Sustained Attention: Impaired Awareness: Impaired Problem Solving: Impaired Behaviors: Perseveration Safety/Judgment:  Impaired Sensation Sensation Light Touch Impaired Details: Impaired RLE;Impaired RUE Stereognosis: Not tested Hot/Cold: Not tested Proprioception: Impaired Detail Proprioception Impaired Details: Impaired RLE;Impaired RUE Additional Comments: Noted while bathing Rt side when vision was occluded in shower Coordination Gross Motor Movements are Fluid and Coordinated: No Fine Motor Movements are Fluid and Coordinated: No Coordination and Movement Description: Ataxic during BADLs + functional transfers  Motor  Motor Motor: Abnormal postural alignment and control;Ataxia Motor - Skilled Clinical Observations: Lt foot drop Mobility  Transfers Transfers: Sit to Stand;Stand to Sit Sit to Stand: From bed(during dressing) Stand to Sit: To bed(during dressing)  Moderate Assistance  Trunk/Postural Assessment  Cervical Assessment Cervical Assessment: Within Functional Limits Thoracic Assessment Thoracic Assessment: Exceptions to WFL(rounded shoulders) Lumbar Assessment Lumbar Assessment: Within Functional Limits Postural Control Postural  Control: Deficits on evaluation Balance Balance Balance Assessed: Yes Dynamic Sitting Balance Dynamic Sitting - Level of Assistance: 5: Stand by assistance Sitting balance - Comments: bathing feet while in shower  Dynamic Standing Balance Dynamic Standing - Level of Assistance: 3: Mod assist(LB bathing + dressing) Dynamic Standing - Comments: max A Extremity/Trunk Assessment RUE Assessment RUE Assessment: (FMC/proprioceptive deficits + ataxia) LUE Assessment LUE Assessment: Exceptions to WFL(Ataxic)   See Function Navigator for Current Functional Status.   Refer to Care Plan for Long Term Goals  Recommendations for other services: None    Discharge Criteria: Patient will be discharged from OT if patient refuses treatment 3 consecutive times without medical reason, if treatment goals not met, if there is a change in medical status, if patient makes no progress towards goals or if patient is discharged from hospital.  The above assessment, treatment plan, treatment alternatives and goals were discussed and mutually agreed upon: by patient  Skeet Simmer 02/21/2018, 12:40 PM

## 2018-02-21 NOTE — Care Management (Signed)
Inpatient Rehabilitation Center Individual Statement of Services  Patient Name:  Nicholas [name]  Date:  02/21/2018  Welcome to the [facility]patient Rehabilitation Center.  Our goal is to provide you with an individualized program based on your diagnosis and situation, designed to meet your specific needs.  With this comprehensive rehabilitation program, you will be expected to participate in at least 3 hours of rehabilitation therapies Monday-Friday, with modified therapy programming on the weekends.  Your rehabilitation program will include the following services:  Physical Therapy (PT), Occupational Therapy (OT), Speech Therapy (ST), 24 hour per day rehabilitation nursing, Therapeutic Recreaction (TR), Neuropsychology, Case Management (Social Worker), Rehabilitation Medicine, Nutrition Services and Pharmacy Services  Weekly team conferences will be held on Tuesdays to discuss your progress.  Your Social Worker will talk with you frequently to get your input and to update you on team discussions.  Team conferences with you and your family in attendance may also be held.  Expected length of stay: 14-17 days    Overall anticipated outcome: supervision  Depending on your progress and recovery, your program may change. Your Social Worker will coordinate services and will keep you informed of any changes. Your Social Worker's name and contact numbers are listed  below.  The following services may also be recommended but are not provided by the Inpatient Rehabilitation Center:   Driving Evaluations  Home Health Rehabiltiation Services  Outpatient Rehabilitation Services    Arrangements will be made to provide these services after discharge if needed.  Arrangements include referral to agencies that provide these services.  Your insurance has been verified to be:  Medicare Your primary doctor is:  Leilani AbleBetti [name]  Pertinent information will be shared with your doctor and your insurance  company.  Social Worker:  MathisLucy Amity Roes, [place] [phone] or [phone]) [phone]   Information discussed with and copy given to patient by: [name], [name], 02/21/2018, 3:30 PM

## 2018-02-21 NOTE — Evaluation (Signed)
Physical Therapy Assessment and Plan  Patient Details  Name: Nicholas [name] MRN: [id number] Date of Birth: 1951/01/20  PT Diagnosis: Abnormality of gait, Cognitive deficits, Difficulty walking, Impaired cognition, Impaired sensation and Muscle weakness Rehab Potential: Good ELOS: 14-17 days   Today's Date: 02/21/2018 PT Individual Time: 1100-1157  And 1345-1405 PT Individual Time Calculation (min): 57 min  And 20 minutes  Problem List:  Patient Active Problem List   Diagnosis Date Noted  . Aortic stenosis 02/20/2018  . Occipital stroke ([place]) 02/20/2018  . Posterior cerebral artery embolism, left   . Right homonymous hemianopsia   . Gait disturbance, post-stroke   . Acquired left foot drop   . Urinary retention   . Diabetes mellitus type 2 in nonobese (HCC)   . Acute blood loss anemia   . Diastolic dysfunction   . Nonrheumatic aortic valve stenosis   . Elevated troponin 02/18/2018  . Stenosis of left carotid artery   . Carotid occlusion, right   . Diabetes mellitus ([place])   . CVA (cerebral vascular accident) ([place]) 02/17/2018  . Pressure injury of skin 10/28/2017  . SBO (small bowel obstruction) ([place]) 10/17/2017  . Bacterial UTI 03/19/2017  . Adjustment disorder   . Slow transit constipation   . Weakness of both lower extremities   . Bilateral foot-drop   . Lumbar radiculopathy 03/13/2017  . Lumbar spinal stenosis 03/13/2017  . Foot drop, right   . Urinary retention due to benign prostatic hyperplasia   . Benign essential HTN   . History of CVA (cerebrovascular accident) 03/11/2017  . Cognitive deficits, late effect of cerebrovascular disease 07/23/2014  . 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 PSVT (paroxysmal supraventricular tachycardia) ([place]) 09/15/2010  . Aortic valve disorder 08/09/2010  . PVD (peripheral vascular disease) ([place]) 08/09/2010  . Hyperlipidemia 08/08/2010  . TOBACCO ABUSE 08/08/2010  . Essential hypertension 08/08/2010  .  CAD, NATIVE VESSEL 08/08/2010  . Occlusion and stenosis of carotid artery 08/08/2010    Past Medical History:  Past Medical History:  Diagnosis Date  . At high risk for falls   . BPH (benign prostatic hyperplasia)   . BPH (benign prostatic hypertrophy)    HX RETENTION  . Carotid artery occlusion    S/P RIGHT CEA  . Coronary artery disease CARDIOLOGIST--  DR Rayann Heman   S/P  PCI TO LAD 1993  . GERD (gastroesophageal reflux disease)   . History of CVA (cerebrovascular accident)    1993---  NO RESIDUALS  . History of head injury    CONCUSSION--  NO RESIDUAL  . History of myocardial infarction    1993--  NON-Q WAVE  S/P PCI TO LAD  . Hydrocele, left   . Hyperlipidemia   . Hypertension   . Left carotid artery stenosis    MILD  . Myocardial infarction ([place])   . PSVT (paroxysmal supraventricular tachycardia) ([place])   . PVD (peripheral vascular disease) ([place])    S/P LEFT FEM-POP  . Right leg weakness    USES CANE--  SECONDARY TO PVD  . SBO (small bowel obstruction) ([place])   . Type 2 diabetes mellitus ([place])   . Wears glasses    Past Surgical History:  Past Surgical History:  Procedure Laterality Date  . CARDIAC CATHETERIZATION  01-18-2003   DR Johnsie Cancel   NON-OBSTRUCTIVE CAD/  PREVIOIS ANGIOPLASTY SITE WIDELY PATENT  . CARDIOVASCULAR STRESS TEST  2007   SMALL ANTERO-APICAL SCAR/  NO ISCHEMIA  . CAROTID ENDARTERECTOMY Right 05-31-2003  . CORONARY  ANGIOPLASTY  1993   PCI TO LAD  . FEMORAL-POPLITEAL BYPASS GRAFT Left 01-19-2003  . HYDROCELE EXCISION Left 10/27/2013   Procedure: HYDROCELECTOMY ADULT;  Surgeon: Hanley Ben, MD;  Location[facility];  Service: Urology;  Laterality: Left;  . RIGHT HYDROCELECTOMY  05-31-2003  . TEE WITHOUT CARDIOVERSION N/A 02/19/2018   Procedure: TRANSESOPHAGEAL ECHOCARDIOGRAM (TEE);  Surgeon: Jerline Pain, MD;  Location: [facility] ENDOSCOPY;  Service: Cardiovascular;  Laterality: N/A;  . TRANSTHORACIC ECHOCARDIOGRAM  08-24-2010   EF 55%/   MILD AORTIC INSUFFICENCY/  MODERATE LVH  . UMBILICAL HERNIA REPAIR N/A 10/21/2017   Procedure: UMBILICAL HERNIA REPAIR;  Surgeon: Donnie Mesa, MD;  Location: [place];  Service: General;  Laterality: N/A;    Assessment & Plan Clinical Impression: Patient is a 67 y.o. year old male with recent admission to the hospital on 02/17/2018 with altered mental status as well as blurred vision. MRIreviewed, showing R>L CVA. Per report,acute large nonhemorrhagic temporal occipital lobe PCA territory infarcts as well as additional small acute posterior circulation and posterior watershed infarcts. Old bifrontal and biparietal lobe infarction the ACA and MCA territories. Chronically occluded right internal carotid artery. Patient did not receive TPA. CT angiogram head and neck showed left posterior cerebral artery P2 segment occlusion. No significant aneurysms stenosis or dissection. Echocardiogram with ejection fraction of 09% grade 1 diastolic dysfunction.Tee 02/19/2018 per Dr Radford Pax with severe aortic stenosis.  Patient transferred to CIR on 02/20/2018 .   Patient currently requires mod with mobility secondary to muscle weakness, decreased coordination and decreased motor planning, decreased attention to right and decreased motor planning, decreased attention, decreased awareness, decreased safety awareness, decreased memory and delayed processing and decreased standing balance and decreased balance strategies.  Prior to hospitalization, patient was modified independent  with mobility and lived with Alone in a [place] home.  Home access is 2Stairs to enter.  Patient will benefit from skilled PT intervention to maximize safe functional mobility, minimize fall risk and decrease caregiver burden for planned discharge home with 24 hour supervision.  Anticipate patient will benefit from follow up [place] at discharge.  PT - End of Session Activity Tolerance: Tolerates 30+ min activity with multiple rests Endurance  Deficit: Yes PT Assessment Rehab Potential (ACUTE/IP ONLY): Good PT Barriers to Discharge: Decreased caregiver support PT Barriers to Discharge Comments: will need 24/7 supervision PT Patient demonstrates impairments in the following area(s): Balance;Sensory;Endurance;Motor;Pain;Safety PT Transfers Functional Problem(s): Bed Mobility;Bed to Chair;Car;Furniture;Floor PT Locomotion Functional Problem(s): Stairs;Wheelchair Mobility;Ambulation PT Plan PT Intensity: Minimum of 1-2 x/day ,45 to 90 minutes PT Frequency: 5 out of 7 days PT Duration Estimated Length of Stay: 14-17 days PT Treatment/Interventions: Ambulation/gait training;Functional mobility training;Discharge planning;Therapeutic Activities;Wheelchair propulsion/positioning;Therapeutic Exercise;Neuromuscular re-education;Balance/vestibular training;Cognitive remediation/compensation;DME/adaptive equipment instruction;Pain management;Splinting/orthotics;UE/LE Strength taining/ROM;UE/LE Coordination activities;Stair training;Patient/family education;Community reintegration PT Transfers Anticipated Outcome(s): supervision PT Locomotion Anticipated Outcome(s): supervision with gait PT Recommendation Follow Up Recommendations: 24 hour supervision/assistance;Home health PT Patient destination: Home Equipment Recommended: To be determined  Skilled Therapeutic Intervention Pt participated in skilled PT eval and was educated on PT POC and goals.  Pt attempted gait without AD and was able to walk 3-4 steps with mod/max A. Pt then given RW and was able to walk initially with mod A, progressing to min A. Pt requires max cuing for staying within RW and requires max cuing due to Rt inattention/field cut.  Simulated car transfer with mod A with total cuing to exit car to Rt side.  Simulated bed mobility in ADL apartment with min A  for supine<> sit.  Stair negotiation x 4 stairs with bilat handrails with min A, max cuing.  Pt left in recliner with needs  at hand, quick release belt donned.  Session 2: PT enters room as pt is tearful/crying, stating that "I urinated on myself again".  Pt perseverative on "this is all too much, this is all too hard".  Pt performs sit to stand with min A, min A standing balance with total A for hygiene and changing brief.  Pt then asks to lay down.  Pt max A for stand pivot transfer to Rt and mod A for sidestepping to Rt to get to head of bed.  Pt left in bed with needs at hand, alarm set.  PT Evaluation Precautions/Restrictions Precautions Precautions: Fall(Simultaneous filing. User may not have seen previous data.) Precaution Comments: Rt visual field cut(Simultaneous filing. User may not have seen previous data.) Restrictions Weight Bearing Restrictions: No(Simultaneous filing. User may not have seen previous data.) Pain Pain Assessment Pain Assessment: No/denies pain Home Living/Prior Functioning Home Living Available Help at Discharge: Available PRN/intermittently;Family Type of Home: Apartment Home Access: Stairs to enter Technical brewer of Steps: 2 Entrance Stairs-Rails: None Home Layout: One level Bathroom Shower/Tub: Walk-in shower(with TTB) Bathroom Toilet: Standard Bathroom Accessibility: Yes Additional Comments: Reports he has a daughter lives nearby who can assist him  Lives With: Alone Prior Function Level of Independence: Independent with basic ADLs;Independent with transfers;Requires assistive device for independence  Able to Take Stairs?: Yes Driving: No Vocation: Unemployed Leisure: Hobbies-no Comments: Uses cane for ambulation Vision/Perception  Perception Perception: Impaired(Rt inattention/neglect) Praxis Praxis: Impaired Praxis Impairment Details: Initiation;Motor planning  Cognition Overall Cognitive Status: No family/caregiver present to determine baseline cognitive functioning(Simultaneous filing. User may not have seen previous data.) Arousal/Alertness:  Awake/alert Attention: Sustained Sustained Attention: Impaired(Simultaneous filing. User may not have seen previous data.) Memory: Impaired(Simultaneous filing. User may not have seen previous data.) Memory Impairment: Decreased recall of new information Awareness: Impaired(Simultaneous filing. User may not have seen previous data.) Awareness Impairment: Emergent impairment Problem Solving: Impaired(Simultaneous filing. User may not have seen previous data.) Behaviors: Perseveration Safety/Judgment: Impaired(Simultaneous filing. User may not have seen previous data.) Sensation Sensation Light Touch: Not tested(Simultaneous filing. User may not have seen previous data.) Light Touch Impaired Details: Impaired RLE;Impaired RUE Stereognosis: Not tested Hot/Cold: Not tested Proprioception: Impaired Detail(Simultaneous filing. User may not have seen previous data.) Proprioception Impaired Details: Impaired RLE;Impaired RUE Additional Comments: Noted while bathing Rt side when vision was occluded in shower Coordination Gross Motor Movements are Fluid and Coordinated: No Fine Motor Movements are Fluid and Coordinated: No Coordination and Movement Description: Ataxic during BADLs + functional transfers  Motor  Motor Motor: Abnormal postural alignment and control;Ataxia Motor - Skilled Clinical Observations: Lt foot drop   Trunk/Postural Assessment  Cervical Assessment Cervical Assessment: Within Functional Limits Thoracic Assessment Thoracic Assessment: Exceptions to WFL(rounded shoulders) Lumbar Assessment Lumbar Assessment: Within Functional Limits Postural Control Postural Control: Deficits on evaluation Righting Reactions: delayed  Balance Balance Balance Assessed: Yes Dynamic Sitting Balance Dynamic Sitting - Level of Assistance: 5: Stand by assistance Sitting balance - Comments: bathing feet while in shower  Static Standing Balance Static Standing - Comment/# of Minutes: min  A Dynamic Standing Balance Dynamic Standing - Level of Assistance: 3: Mod assist(LB bathing + dressing) Dynamic Standing - Comments: max A Extremity Assessment  RUE Assessment RUE Assessment: (FMC/proprioceptive deficits + ataxia) LUE Assessment LUE Assessment: Exceptions to WFL(Ataxic) RLE Assessment RLE Assessment: (grossly 3/5) LLE Assessment LLE Assessment: (0/5  DF, hip and knee 3/5)   See Function Navigator for Current Functional Status.   Refer to Care Plan for Long Term Goals  Recommendations for other services: None   Discharge Criteria: Patient will be discharged from PT if patient refuses treatment 3 consecutive times without medical reason, if treatment goals not met, if there is a change in medical status, if patient makes no progress towards goals or if patient is discharged from hospital.  The above assessment, treatment plan, treatment alternatives and goals were discussed and mutually agreed upon: by patient  [facility] 02/21/2018, 12:39 PM

## 2018-02-21 NOTE — Evaluation (Signed)
Speech Language Pathology Assessment and Plan  Patient Details  Name: Nicholas [name] MRN: [id number] Date of Birth: 07-29-1951  SLP Diagnosis: Cognitive Impairments  Rehab Potential: Good ELOS: 14-16 days    Today's Date: 02/21/2018 SLP Individual Time: 6160-7371 SLP Individual Time Calculation (min): 60 min   Problem List:  Patient Active Problem List   Diagnosis Date Noted  . Aortic stenosis 02/20/2018  . Occipital stroke ([place]) 02/20/2018  . Posterior cerebral artery embolism, left   . Right homonymous hemianopsia   . Gait disturbance, post-stroke   . Acquired left foot drop   . Urinary retention   . Diabetes mellitus type 2 in nonobese (HCC)   . Acute blood loss anemia   . Diastolic dysfunction   . Nonrheumatic aortic valve stenosis   . Elevated troponin 02/18/2018  . Stenosis of left carotid artery   . Carotid occlusion, right   . Diabetes mellitus ([place])   . CVA (cerebral vascular accident) ([place]) 02/17/2018  . Pressure injury of skin 10/28/2017  . SBO (small bowel obstruction) ([place]) 10/17/2017  . Bacterial UTI 03/19/2017  . Adjustment disorder   . Slow transit constipation   . Weakness of both lower extremities   . Bilateral foot-drop   . Lumbar radiculopathy 03/13/2017  . Lumbar spinal stenosis 03/13/2017  . Foot drop, right   . Urinary retention due to benign prostatic hyperplasia   . Benign essential HTN   . History of CVA (cerebrovascular accident) 03/11/2017  . Cognitive deficits, late effect of cerebrovascular disease 07/23/2014  . 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 PSVT (paroxysmal supraventricular tachycardia) ([place]) 09/15/2010  . Aortic valve disorder 08/09/2010  . PVD (peripheral vascular disease) ([place]) 08/09/2010  . Hyperlipidemia 08/08/2010  . TOBACCO ABUSE 08/08/2010  . Essential hypertension 08/08/2010  . CAD, NATIVE VESSEL 08/08/2010  . Occlusion and stenosis of carotid artery 08/08/2010   Past Medical History:   Past Medical History:  Diagnosis Date  . At high risk for falls   . BPH (benign prostatic hyperplasia)   . BPH (benign prostatic hypertrophy)    HX RETENTION  . Carotid artery occlusion    S/P RIGHT CEA  . Coronary artery disease CARDIOLOGIST--  DR Rayann Heman   S/P  PCI TO LAD 1993  . GERD (gastroesophageal reflux disease)   . History of CVA (cerebrovascular accident)    1993---  NO RESIDUALS  . History of head injury    CONCUSSION--  NO RESIDUAL  . History of myocardial infarction    1993--  NON-Q WAVE  S/P PCI TO LAD  . Hydrocele, left   . Hyperlipidemia   . Hypertension   . Left carotid artery stenosis    MILD  . Myocardial infarction ([place])   . PSVT (paroxysmal supraventricular tachycardia) ([place])   . PVD (peripheral vascular disease) ([place])    S/P LEFT FEM-POP  . Right leg weakness    USES CANE--  SECONDARY TO PVD  . SBO (small bowel obstruction) ([place])   . Type 2 diabetes mellitus ([place])   . Wears glasses    Past Surgical History:  Past Surgical History:  Procedure Laterality Date  . CARDIAC CATHETERIZATION  01-18-2003   DR Johnsie Cancel   NON-OBSTRUCTIVE CAD/  PREVIOIS ANGIOPLASTY SITE WIDELY PATENT  . CARDIOVASCULAR STRESS TEST  2007   SMALL ANTERO-APICAL SCAR/  NO ISCHEMIA  . CAROTID ENDARTERECTOMY Right 05-31-2003  . CORONARY ANGIOPLASTY  1993   PCI TO LAD  . FEMORAL-POPLITEAL BYPASS GRAFT Left 01-19-2003  .  HYDROCELE EXCISION Left 10/27/2013   Procedure: HYDROCELECTOMY ADULT;  Surgeon: Hanley Ben, MD;  Location: [facility];  Service: Urology;  Laterality: Left;  . RIGHT HYDROCELECTOMY  05-31-2003  . TEE WITHOUT CARDIOVERSION N/A 02/19/2018   Procedure: TRANSESOPHAGEAL ECHOCARDIOGRAM (TEE);  Surgeon: Jerline Pain, MD;  Location: [facility] ENDOSCOPY;  Service: Cardiovascular;  Laterality: N/A;  . TRANSTHORACIC ECHOCARDIOGRAM  08-24-2010   EF 55%/  MILD AORTIC INSUFFICENCY/  MODERATE LVH  . UMBILICAL HERNIA REPAIR N/A 10/21/2017   Procedure: UMBILICAL  HERNIA REPAIR;  Surgeon: Donnie Mesa, MD;  Location: [place];  Service: General;  Laterality: N/A;    Assessment / Plan / Recommendation Clinical Impression Patient is a 67 y.o.right handed malewith history ofCVA and received inpatient rehabilitation services August 2015,, concussion, back injury with left foot drop with lumbar stenosis received inpatient rehabilitation services April 2018,supraventricular tachycardia, CAD/PCI maintained on Plavix, urinary retention, small bowel obstruction, diabetes mellitus. Per chart review,patient lives alone. Used a cane prior to admission.He does not drive.One level home. He has a daughter and brother in the area who work. Presented 02/17/2018 with altered mental status as well as blurred vision. MRIreviewed, showing R>L CVA. Per report,acute large nonhemorrhagic temporal occipital lobe PCA territory infarcts as well as additional small acute posterior circulation and posterior watershed infarcts. Old bifrontal and biparietal lobe infarction the ACA and MCA territories. Chronically occluded right internal carotid artery. Patient did not receive TPA. CT angiogram head and neck showed left posterior cerebral artery P2 segment occlusion. No significant aneurysms stenosis or dissection. Echocardiogram with ejection fraction of 88% grade 1 diastolic dysfunction.Tee 02/19/2018 per Dr Radford Pax with severe aortic stenosisand currently no plan for intervention and he would follow-up with Dr. Burt Knack cardiology services as an outpatient to discuss further care after he recovers from CVA and progressed through rehabilitation. Plan 30 day event monitor as outpatient.Currently maintained on aspirin and Plavix for CVA prophylaxishowever due to large size of stroke in recommended to start Eliquisno sooner than 02/23/2018 to avoid hemorrhagic conversion. Patient can remain on aspirin81 mgat that time and Plavix can be discontinued. Physicaland occupationaltherapy  evaluationscompleted 02/18/2018 with recommendations of physical medicine rehabilitation consult.patient was admitted for a comprehensive rehab program. Pt admitted to [facility] on 02/20/2018.  Pt presents with moderate - severe cognitive deficits, which impacts his safety with functional and familiar tasks. However, unclear of true cognitive baseline at this time. Pt administered MOCA version 7.3 and scored 11 out of 30 points with 26 and above considered normal. Pt demonstrates deficits in problem solving, recall, attention, and intellectual awareness. Throughout the evaluation, pt required Max A verbal cues for motivation and sustained attention to task, as pt stated multiple times that the task was "too much information" to him. Pt demonstrates left gaze preference and visual deficits; however, unable to determine impact vision has on function this time. Throughout evaluation, pt utilized several predictable phrases in response to questions, but it is unclear if this is related to his cognitive impairments versus a language impairment. Continued diagnostic treatment is needed at this time. Patient would benefit from skilled SLP intervention to maximize his cognitive function in order to maximize his overall functional independence prior to discharge.      Skilled Therapeutic Interventions          Patient administered MOCA version 7.3. Please see above for details. Educated patient in regards to his current cognitive deficits and goals of skilled SLP intervention. Pt verbalized understanding and agreement.     SLP  Assessment  Patient will need skilled [facility] during CIR admission    Recommendations  Oral Care Recommendations: Oral care BID Recommendations for Other Services: Neuropsych consult Patient destination: Home Follow up Recommendations: Home Health SLP;24 hour supervision/assistance Equipment Recommended: None recommended by SLP    SLP Frequency 3 to 5 out of 7  days   SLP Duration  SLP Intensity  SLP Treatment/Interventions 14-16 days  Minumum of 1-2 x/day, 30 to 90 minutes  Cognitive remediation/compensation;Multimodal communication approach;Cueing hierarchy;Functional tasks;Therapeutic Activities;Internal/external aids;Patient/family education    Pain Pain Assessment Pain Assessment: No/denies pain  Prior Functioning Type of Home: Apartment  Lives With: Alone Available Help at Discharge: Available PRN/intermittently;Family Vocation: Unemployed  Function:  Cognition Comprehension Comprehension assist level: Understands basic 90% of the time/cues < 10% of the time  Expression   Expression assist level: Expresses basic 75 - 89% of the time/requires cueing 10 - 24% of the time. Needs helper to occlude trach/needs to repeat words.  Social Interaction Social Interaction assist level: Interacts appropriately 25 - 49% of time - Needs frequent redirection.  Problem Solving Problem solving assist level: Solves basic 25 - 49% of the time - needs direction more than half the time to initiate, plan or complete simple activities  Memory Memory assist level: Recognizes or recalls 25 - 49% of the time/requires cueing 50 - 75% of the time    Short Term Goals: Week 1: SLP Short Term Goal 1 (Week 1): Pt will demonstrate intellectual awareness by identifying 2 physical and cognitive deficits with Mod A verbal cues.  SLP Short Term Goal 2 (Week 1): Pt will complete basic problem solving tasks with Mod A verbal cues.  SLP Short Term Goal 3 (Week 1): Pt will recall daily new information with utilization of external aids with Mod A verbal cues.  SLP Short Term Goal 4 (Week 1): Pt will demonstrate sustained attention for 10 minutes during functional tasks with Mod A verbal cues.  SLP Short Term Goal 5 (Week 1): Pt will attend to right field of environment during functional tasks with Mod A verbal cues.    Refer to Care Plan for Long Term  Goals  Recommendations for other services: Neuropsych  Discharge Criteria: Patient will be discharged from SLP if patient refuses treatment 3 consecutive times without medical reason, if treatment goals not met, if there is a change in medical status, if patient makes no progress towards goals or if patient is discharged from hospital.  The above assessment, treatment plan, treatment alternatives and goals were discussed and mutually agreed upon: by patient  Meredeth Ide  SLP - Student 02/21/2018, 1:36 PM

## 2018-02-21 NOTE — Plan of Care (Signed)
LTGs established 02/21/18

## 2018-02-21 NOTE — Progress Notes (Signed)
Social Work  Social Work Assessment and Plan  Patient Details  Name: Nicholas [name] MRN: [id number] Date of Birth: Jan 03, 1951  Today's Date: 02/21/2018  Problem List:  Patient Active Problem List   Diagnosis Date Noted  . Aortic stenosis 02/20/2018  . Occipital stroke (HCC) 02/20/2018  . Posterior cerebral artery embolism, left   . Right homonymous hemianopsia   . Gait disturbance, post-stroke   . Acquired left foot drop   . Urinary retention   . Diabetes mellitus type 2 in nonobese (HCC)   . Acute blood loss anemia   . Diastolic dysfunction   . Nonrheumatic aortic valve stenosis   . Elevated troponin 02/18/2018  . Stenosis of left carotid artery   . Carotid occlusion, right   . Diabetes mellitus (HCC)   . CVA (cerebral vascular accident) (HCC) 02/17/2018  . Pressure injury of skin 10/28/2017  . SBO (small bowel obstruction) (HCC) 10/17/2017  . Bacterial UTI 03/19/2017  . Adjustment disorder   . Slow transit constipation   . Weakness of both lower extremities   . Bilateral foot-drop   . Lumbar radiculopathy 03/13/2017  . Lumbar spinal stenosis 03/13/2017  . Foot drop, right   . Urinary retention due to benign prostatic hyperplasia   . Benign essential HTN   . History of CVA (cerebrovascular accident) 03/11/2017  . Cognitive deficits, late effect of cerebrovascular disease 07/23/2014  . 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 PSVT (paroxysmal supraventricular tachycardia) (HCC) 09/15/2010  . Aortic valve disorder 08/09/2010  . PVD (peripheral vascular disease) (HCC) 08/09/2010  . Hyperlipidemia 08/08/2010  . TOBACCO ABUSE 08/08/2010  . Essential hypertension 08/08/2010  . CAD, NATIVE VESSEL 08/08/2010  . Occlusion and stenosis of carotid artery 08/08/2010   Past Medical History:  Past Medical History:  Diagnosis Date  . At high risk for falls   . BPH (benign prostatic hyperplasia)   . BPH (benign prostatic hypertrophy)    HX RETENTION  .  Carotid artery occlusion    S/P RIGHT CEA  . Coronary artery disease CARDIOLOGIST--  DR Johney Frame   S/P  PCI TO LAD 1993  . GERD (gastroesophageal reflux disease)   . History of CVA (cerebrovascular accident)    1993---  NO RESIDUALS  . History of head injury    CONCUSSION--  NO RESIDUAL  . History of myocardial infarction    1993--  NON-Q WAVE  S/P PCI TO LAD  . Hydrocele, left   . Hyperlipidemia   . Hypertension   . Left carotid artery stenosis    MILD  . Myocardial infarction (HCC)   . PSVT (paroxysmal supraventricular tachycardia) (HCC)   . PVD (peripheral vascular disease) (HCC)    S/P LEFT FEM-POP  . Right leg weakness    USES CANE--  SECONDARY TO PVD  . SBO (small bowel obstruction) (HCC)   . Type 2 diabetes mellitus (HCC)   . Wears glasses    Past Surgical History:  Past Surgical History:  Procedure Laterality Date  . CARDIAC CATHETERIZATION  01-18-2003   DR Eden Emms   NON-OBSTRUCTIVE CAD/  PREVIOIS ANGIOPLASTY SITE WIDELY PATENT  . CARDIOVASCULAR STRESS TEST  2007   SMALL ANTERO-APICAL SCAR/  NO ISCHEMIA  . CAROTID ENDARTERECTOMY Right 05-31-2003  . CORONARY ANGIOPLASTY  1993   PCI TO LAD  . FEMORAL-POPLITEAL BYPASS GRAFT Left 01-19-2003  . HYDROCELE EXCISION Left 10/27/2013   Procedure: HYDROCELECTOMY ADULT;  Surgeon: Lindaann Slough, MD;  [facility];  Service: Urology;  Laterality: Left;  . RIGHT HYDROCELECTOMY  05-31-2003  . TEE WITHOUT CARDIOVERSION N/A 02/19/2018   Procedure: TRANSESOPHAGEAL ECHOCARDIOGRAM (TEE);  Surgeon: Jake Bathe, MD;  Location: [facility] ENDOSCOPY;  Service: Cardiovascular;  Laterality: N/A;  . TRANSTHORACIC ECHOCARDIOGRAM  08-24-2010   EF 55%/  MILD AORTIC INSUFFICENCY/  MODERATE LVH  . UMBILICAL HERNIA REPAIR N/A 10/21/2017   Procedure: UMBILICAL HERNIA REPAIR;  Surgeon: Manus Rudd, MD;  Location: [facility] OR;  Service: General;  Laterality: N/A;   Social History:  reports that he has quit smoking. His smoking use  included cigarettes. He quit after 0.00 years of use. he has never used smokeless tobacco. He reports that he does not drink alcohol or use drugs.  Family / Support Systems Marital Status: Divorced Patient Roles: Parent Children: daughter, Lynford Humphrey @ [phone]) [phone] Other Supports: daughter's boyfriend (live together), Mamie Levers @ (C) [phone];  pt's brother, Alejos Reinhardt (local) @ (C) [phone] Anticipated Caregiver: daughter and her bf  Ability/Limitations of Caregiver: she works days, bf able to assist  Caregiver Availability: 24/7 Family Dynamics: Pt describes good support from daughter/ family and denies any concerns about support at home.  Social History Preferred language: English Religion: Baptist Cultural Background: NA Read: Yes Write: Yes Employment Status: Disabled Date Retired/Disabled/Unemployed: 2015 Fish farm manager Issues: None Guardian/Conservator: None- per MD, pt is capable of making decisions on his own behalf.   Abuse/Neglect Abuse/Neglect Assessment Can Be Completed: Yes Physical Abuse: Denies Verbal Abuse: Denies Sexual Abuse: Denies Exploitation of patient/patient's resources: Denies Self-Neglect: Denies  Emotional Status Pt's affect, behavior adn adjustment status: Pt familiar to this SW from prior CIR stay in 2018.  Pt is very soft-spoken and answers questions with brief responses.  He appears fatigued from morning therapies.  He denies any significant emotional distress, however, alerted by therapists this afternoon that pt was tearful and embarassed due to incont episode.  Will follow for support and refer for neuropsychology consult as well. Recent Psychosocial Issues: Prior CVA 2015 and back surgery 2018. Pyschiatric History: None Substance Abuse History: None  Patient / Family Perceptions, Expectations & Goals Pt/Family understanding of illness & functional limitations: Pt and daughter with good understanding of  this CVA and resulting functional limitations/ need for further CIR. Premorbid pt/family roles/activities: Pt had been able to regain his independence and was living alone in his apartment.  Does not drive. Anticipated changes in roles/activities/participation: Per goals, daughter will resume caregiver support role. Pt/family expectations/goals: Pt states his main goal is to be able to return to his home at an independent level again.  Community [employer] Agencies: None Premorbid Home Care/DME Agencies: Other (Comment)(Has had both [facility] and [facility] in the past.) Transportation available at discharge: yes Resource referrals recommended: Neuropsychology, Support group (specify)  Discharge Planning Living Arrangements: Alone Support Systems: Children, Other relatives, Friends/neighbors Type of Residence: Private residence Insurance Resources: [employer] Financial Resources: Restaurant manager, fast food Screen Referred: No Living Expenses: Rent Money Management: Patient Does the patient have any problems obtaining your medications?: No Home Management: pt Patient/Family Preliminary Plans: Pt expects to d/c to daughter's home where 24/7 support available as he has done after prior hospital discharges. Social Work Anticipated Follow Up Needs: HH/OP Expected length of stay: 12-16 days  Clinical Impression Very pleasant gentleman who returns to CIR for a third time following another stroke.  He is very soft spoken and provide brief responses to questions.  When questioned, patient denies any significant emotional distress, however, therapies later report  he had been tearful in some sessions.  Patient intends to discharge home with daughter where 24/7 support can be provided.  Social work to follow for support and discharge planning, but,  will also refer for neuropsychology support.  Marsia Cino 02/21/2018, 3:26 PM

## 2018-02-21 NOTE — Plan of Care (Signed)
  Not Progressing RH BLADDER ELIMINATION RH STG MANAGE BLADDER WITH ASSISTANCE Description STG Manage Bladder With Min Assistance  02/21/2018 0931 - Not Progressing by Dani Gobbleeardon, Icela Glymph J, RN   Requires max assist for incontinence.

## 2018-02-21 NOTE — Progress Notes (Signed)
[facility]IC[facility]ILIT[facility]     PROGRESS NOTE    Subjective/Complaints: No new issues. Slept ok. A little down about stroke. deneis pain or headache  ROS: Patient denies fever, rash, sore throat, blurred vision, nausea, vomiting, diarrhea, cough, shortness of breath or chest pain, joint or back pain, headache, or mood change.   Objective: Vital Signs: Blood pressure (!) 136/46, pulse 65, temperature 98.4 F (36.9 C), temperature source Oral, resp. rate 16, height 5\' 11"  (1.803 m), weight 78.2 kg (172 lb 4.8 oz), SpO2 99 %. No results found. Recent Labs    02/20/18 0428 02/21/18 0624  WBC 13.4* 9.5  HGB 12.0* 11.6*  HCT 34.9* 33.9*  PLT 265 273   Recent Labs    02/20/18 0428 02/21/18 0624  NA 137 141  K 3.8 4.1  CL 101 104  GLUCOSE 81 77  BUN 11 15  CREATININE 1.03 1.05  CALCIUM 8.7* 9.0   CBG (last 3)  Recent Labs    02/20/18 1621 02/20/18 2152 02/21/18 0659  GLUCAP 76 84 76    Wt Readings from Last 3 Encounters:  02/20/18 78.2 kg (172 lb 4.8 oz)  02/18/18 78.3 kg (172 lb 9.9 oz)  10/17/17 85.5 kg (188 lb 7.9 oz)    Physical Exam:  Constitutional: No distress . Vital signs reviewed. HEENT: EOMI, oral membranes moist Cardiovascular: RRR without murmur. No JVD    Respiratory: CTA Bilaterally without wheezes or rales. Normal effort    GI: BS +, non-tender, non-distended  Skin. Patient with large scar right side of neck from history of CVA Musculoskeletal: No edema or tenderness in extremities Neurological: He isalert. Patient provides his date of birth but could not give appropriate age. Delayed processing.  Dense right HH. ?neglect Motor: 4+/5 grossly throughoutexcept 0/5left ankle dorsiflexors Senses LT and pain in all 4's.  Psych:affect flat   Assessment/Plan: 1. Decreased functional mobility and visual-spatial deficits secondary to left temporal-occipital infarct which require 3+ hours per day of interdisciplinary therapy  in a comprehensive inpatient rehab setting. Physiatrist is providing close team supervision and 24 hour management of active medical problems listed below. Physiatrist and rehab team continue to assess barriers to discharge/monitor patient progress toward functional and medical goals.  Function:  Bathing Bathing position Bathing activity did not occur: N/A Position: Shower  Bathing parts Body parts bathed by patient: Right arm, Left arm, Chest, Abdomen, Front perineal area, Right upper leg, Left upper leg, Right lower leg, Left lower leg Body parts bathed by helper: Buttocks, Back  Bathing assist Assist Level: Touching or steadying assistance(Pt > 75%)      Upper Body Dressing/Undressing Upper body dressing   What is the patient wearing?: Pull over shirt/dress     Pull over shirt/dress - Perfomed by patient: Thread/unthread right sleeve, Thread/unthread left sleeve, Put head through opening, Pull shirt over trunk          Upper body assist Assist Level: Supervision or verbal cues      Lower Body Dressing/Undressing Lower body dressing   What is the patient wearing?: Underwear, Pants, Non-skid slipper socks Underwear - Performed by patient: Thread/unthread right underwear leg, Thread/unthread left underwear leg, Pull underwear up/down   Pants- Performed by patient: Thread/unthread right pants leg, Thread/unthread left pants leg, Pull pants up/down     Non-skid slipper socks- Performed by helper: Don/doff right sock, Don/doff left sock                  Lower  body assist Assist for lower body dressing: Touching or steadying assistance (Pt > 75%)      Toileting Toileting Toileting activity did not occur: No continent bowel/bladder event   Toileting steps completed by helper: Adjust clothing prior to toileting, Performs perineal hygiene, Adjust clothing after toileting    Toileting assist Assist level: Touching or steadying assistance (Pt.75%)   Transfers Chair/bed  transfer             Locomotion Ambulation           Wheelchair          Cognition Comprehension Comprehension assist level: Understands basic 75 - 89% of the time/ requires cueing 10 - 24% of the time  Expression Expression assist level: Expresses basic 75 - 89% of the time/requires cueing 10 - 24% of the time. Needs helper to occlude trach/needs to repeat words.  Social Interaction Social Interaction assist level: Interacts appropriately 90% of the time - Needs monitoring or encouragement for participation or interaction.  Problem Solving Problem solving assist level: Solves basic 50 - 74% of the time/requires cueing 25 - 49% of the time  Memory Memory assist level: Recognizes or recalls 50 - 74% of the time/requires cueing 25 - 49% of the time   Medical Problem List and Plan: 1.Decreased functional mobility with altered mental statussecondary to acute large nonhemorrhagic temporal occipital lobe PCA territory infarct, additional small acute posterior circulation and posterior watershed infarcts.Plan 30 day event monitor as outpatient.   -beginning therapies today 2. DVT Prophylaxis/Anticoagulation: SCDs. Monitor for any signs of DVT 3. Pain Management:Tylenol as needed 4. Mood:Provide emotional support as possible. Discussed mood with patient this morning. Denies depression 5. Neuropsych: This patientiscapable of making decisions on hisown behalf. 6. Skin/Wound Care:Routine skin checks 7. Fluids/Electrolytes/Nutrition:encourage PO   -I personally reviewed the patient's labs today.   8.Supraventricular tachycardia with history of CAD/PCI and noted findings of aortic stenosis. Current plan to follow up outpatient with cardiology services. Presently on aspirin and PlavixWITH PLAN TO BEGIN ELIQUIS5 mg twice daily 02/23/2018 anddecrease aspirin 81 mg daily anddiscontinue Plavix at that time 9.Diet controlled diabetes mellitus. Hemoglobin A1c 4.8. Currently with  SSI. 10.History of urinary retention. Check PVR 3--urine reported as malodorous 11.Hyperlipidemia. Crestor 12.Constipation. Laxative assistance   LOS (Days) 1 A FACE TO FACE EVALUATION WAS PERFORMED  Ranelle Oyster, MD 02/21/2018 10:57 AM

## 2018-02-22 ENCOUNTER — Inpatient Hospital Stay (HOSPITAL_COMMUNITY): Payer: Medicare Other | Admitting: Occupational Therapy

## 2018-02-22 ENCOUNTER — Inpatient Hospital Stay (HOSPITAL_COMMUNITY): Payer: Medicare Other | Admitting: Physical Therapy

## 2018-02-22 ENCOUNTER — Inpatient Hospital Stay (HOSPITAL_COMMUNITY): Payer: Medicare Other | Admitting: Speech Pathology

## 2018-02-22 LAB — GLUCOSE, CAPILLARY
GLUCOSE-CAPILLARY: 73 mg/dL (ref 65–99)
GLUCOSE-CAPILLARY: 85 mg/dL (ref 65–99)
GLUCOSE-CAPILLARY: 98 mg/dL (ref 65–99)
Glucose-Capillary: 94 mg/dL (ref 65–99)
Glucose-Capillary: 59 mg/dL — ABNORMAL LOW (ref 65–99)
Glucose-Capillary: 65 mg/dL (ref 65–99)
Glucose-Capillary: 69 mg/dL (ref 65–99)

## 2018-02-22 NOTE — Progress Notes (Signed)
Blood sugar 66. Snack of one cup of apple sauce and some graham crackers and blood sugar came up to 115. Patient then given another cup of apple sauce and graham crackers.

## 2018-02-22 NOTE — Progress Notes (Signed)
Speech Language Pathology Daily Session Note  Patient Details  Name: Nicholas [name] J Friedlander MRN: [id number] Date of Birth: 07/29/1951  Today's Date: 02/22/2018 SLP Individual Time: 0900-0930 SLP Individual Time Calculation (min): 30 min  Short Term Goals: Week 1: SLP Short Term Goal 1 (Week 1): Pt will demonstrate intellectual awareness by identifying 2 physical and cognitive deficits with Mod A verbal cues.  SLP Short Term Goal 2 (Week 1): Pt will complete basic problem solving tasks with Mod A verbal cues.  SLP Short Term Goal 3 (Week 1): Pt will recall daily new information with utilization of external aids with Mod A verbal cues.  SLP Short Term Goal 4 (Week 1): Pt will demonstrate sustained attention for 10 minutes during functional tasks with Mod A verbal cues.  SLP Short Term Goal 5 (Week 1): Pt will attend to right field of environment during functional tasks with Mod A verbal cues.   Skilled Therapeutic Interventions: Skilled treatment session focused on cognitive goals. SLP facilitated session by providing total A for recall of events from previous therapy session. Patient also located functional items within all visual fields with extra time and Mod A verbal cues needed to locate items within his right, lower visual field. However, patient independently utilized tactile cues to help locate items and identify them correctly. Patient left upright in wheelchair with alarm on, quick release belt in place and all needs within reach. Continue with current plan of care.      Function:   Cognition Comprehension Comprehension assist level: Understands basic 90% of the time/cues < 10% of the time  Expression   Expression assist level: Expresses basic 75 - 89% of the time/requires cueing 10 - 24% of the time. Needs helper to occlude trach/needs to repeat words.  Social Interaction Social Interaction assist level: Interacts appropriately 75 - 89% of the time - Needs redirection for appropriate  language or to initiate interaction.  Problem Solving Problem solving assist level: Solves basic 25 - 49% of the time - needs direction more than half the time to initiate, plan or complete simple activities  Memory Memory assist level: Recognizes or recalls 25 - 49% of the time/requires cueing 50 - 75% of the time    Pain Pain Assessment Pain Assessment: No/denies pain  Therapy/Group: Individual Therapy  Paislie Tessler 02/22/2018, 11:20 AM

## 2018-02-22 NOTE — IPOC Note (Signed)
Overall Plan of Care [facility](IPOC) Patient Details Name: Nicholas FullingWillie J [name] MRN: [id number] DOB: 11/30/1951  Admitting Diagnosis: <principal problem not specified> left PCA infarct  Hospital Problems: Active Problems:   Occipital stroke [facility](HCC)   Posterior cerebral artery embolism, left   Right homonymous hemianopsia   Gait disturbance, post-stroke   Acquired left foot drop     Functional Problem List: Nursing Bladder, Endurance, Medication Management, Motor, Perception, Sensory, Safety  PT Balance, Sensory, Endurance, Motor, Pain, Safety  OT Balance, Perception, Cognition, Safety, Endurance, Motor, Vision  SLP Cognition  TR         Basic ADL's: OT Eating, Grooming, Bathing, Dressing, Toileting     Advanced  ADL's: OT Simple Meal Preparation     Transfers: PT Bed Mobility, Bed to Chair, Car, Furniture, Floor  OT Toilet, Tub/Shower     Locomotion: PT Stairs, Psychologist, prison and probation servicesWheelchair Mobility, Ambulation     Additional Impairments: OT Fuctional Use of Upper Extremity  SLP Social Cognition   Problem Solving, Memory, Attention, Awareness  TR      Anticipated Outcomes Item Anticipated Outcome  Self Feeding Supervision/cuing   Swallowing      Basic self-care  Supervision/cuing   Toileting  Supervision/cuing    Bathroom Transfers Supervision/cuing   Bowel/Bladder  Pt will manage bowel and bladder with min assistance at discharge  Transfers  supervision  Locomotion  supervision with gait  Communication     Cognition  Min A  Pain  Pt will manage pain at 3 or less on a scale of 0-10 while in rehab.   Safety/Judgment  Pt will remain free of falls while in rehab with min assist    Therapy Plan: PT Intensity: Minimum of 1-2 x/day ,45 to 90 minutes PT Frequency: 5 out of 7 days PT Duration Estimated Length of Stay: 14-17 days OT Intensity: Minimum of 1-2 x/day, 45 to 90 minutes OT Frequency: 5 out of 7 days OT Duration/Estimated Length of Stay: 14-16 days SLP Intensity: Minumum  of 1-2 x/day, 30 to 90 minutes SLP Frequency: 3 to 5 out of 7 days SLP Duration/Estimated Length of Stay: 14-16 days    Team Interventions: Nursing Interventions Patient/Family Education, Bladder Management, Disease Management/Prevention, Pain Management, Discharge Planning, Medication Management, Skin Care/Wound Management, Cognitive Remediation/Compensation  PT interventions Ambulation/gait training, Functional mobility training, Discharge planning, Therapeutic Activities, Wheelchair propulsion/positioning, Therapeutic Exercise, Neuromuscular re-education, Balance/vestibular training, Cognitive remediation/compensation, DME/adaptive equipment instruction, Pain management, Splinting/orthotics, UE/LE Strength taining/ROM, UE/LE Coordination activities, Stair training, Patient/family education, Community reintegration  OT Interventions Warden/rangerBalance/vestibular training, Discharge planning, Functional electrical stimulation, Pain management, Self Care/advanced ADL retraining, Therapeutic Activities, UE/LE Coordination activities, Visual/perceptual remediation/compensation, Therapeutic Exercise, Skin care/wound managment, Patient/family education, Functional mobility training, Disease mangement/prevention, Cognitive remediation/compensation, FirefighterCommunity reintegration, Fish farm managerDME/adaptive equipment instruction, Neuromuscular re-education, Psychosocial support, UE/LE Strength taining/ROM, Splinting/orthotics, Wheelchair propulsion/positioning  SLP Interventions Cognitive remediation/compensation, Multimodal communication approach, Cueing hierarchy, Functional tasks, Therapeutic Activities, Internal/external aids, Patient/family education  TR Interventions    SW/CM Interventions Discharge Planning, Psychosocial Support, Patient/Family Education   Barriers to Discharge MD  Medical stability  Nursing      PT Decreased caregiver support will need 24/7 supervision  OT Decreased caregiver support will need 24/7 supervision  at d/c  SLP      SW       Team Discharge Planning: Destination: PT-Home ,OT- Home , SLP-Home Projected Follow-up: PT-24 hour supervision/assistance, Home health PT, OT-  Home health OT, SLP-Home Health SLP, 24 hour supervision/assistance Projected Equipment Needs: PT-To be determined, OT- To be determined,  SLP-None recommended by SLP Equipment Details: PT- , OT-  Patient/family involved in discharge planning: PT- Patient,  OT-Patient, SLP-Patient  MD ELOS: 14-17 days Medical Rehab Prognosis:  Excellent Assessment: The patient has been admitted for CIR therapies with the diagnosis of left PCA infarct. The team will be addressing functional mobility, strength, stamina, balance, safety, adaptive techniques and equipment, self-care, bowel and bladder mgt, patient and caregiver education, visual-spatial awareness, ego support, community reentry, cognition, NMR. Goals have been set at supervision with self-care and ADL's, supervision for mobility, and supervision to min assist for cognition.    Ranelle Oyster, MD, FAAPMR      See Team Conference Notes for weekly updates to the plan of care

## 2018-02-22 NOTE — Progress Notes (Signed)
Hypoglycemic Event  CBG:59  Treatment: 15 GM carbohydrate snack and Dinner  Symptoms: None  Follow-up CBG: Time:1738 CBG Result:85  Possible Reasons for Event: Unknown  Comments/MD notified: Standing order followed-1 time intervention     Nicholas [name], Sylvie FarrierAshley Marie

## 2018-02-22 NOTE — Plan of Care (Signed)
  Progressing RH SKIN INTEGRITY RH STG SKIN FREE OF INFECTION/BREAKDOWN Description No new breakdown with min assist   02/22/2018 0901 - Progressing by Nayali Talerico, Danella MaiersAshley M, RN RH SAFETY RH STG ADHERE TO SAFETY PRECAUTIONS W/ASSISTANCE/DEVICE Description STG Adhere to Safety Precautions With min Assistance/Device.  02/22/2018 0901 - Progressing by Davin Archuletta, Danella MaiersAshley M, RN RH COGNITION-NURSING RH STG ANTICIPATES NEEDS/CALLS FOR ASSIST W/ASSIST/CUES Description STG Anticipates Needs/Calls for Assist With min Assistance/Cues.  02/22/2018 0901 - Progressing by Deyonte Cadden, Danella MaiersAshley M, RN

## 2018-02-22 NOTE — Progress Notes (Signed)
[facility] PHYS[facility] MEDICINE & REHABILITATION     PROGRESS NOTE    Subjective/Complaints: No new issues overnight. Slept well. Sugars did drop  ROS: Patient denies fever, rash, sore throat, blurred vision, nausea, vomiting, diarrhea, cough, shortness of breath or chest pain, joint or back pain, headache, or mood change. .   Objective: Vital Signs: Blood pressure (!) 134/57, pulse 71, temperature 99.6 F (37.6 C), temperature source Oral, resp. rate 18, height 5\' 11"  (1.803 m), weight 78.2 kg (172 lb 4.8 oz), SpO2 99 %. No results found. Recent Labs    02/20/18 0428 02/21/18 0624  WBC 13.4* 9.5  HGB 12.0* 11.6*  HCT 34.9* 33.9*  PLT 265 273   Recent Labs    02/20/18 0428 02/21/18 0624  NA 137 141  K 3.8 4.1  CL 101 104  GLUCOSE 81 77  BUN 11 15  CREATININE 1.03 1.05  CALCIUM 8.7* 9.0   CBG (last 3)  Recent Labs    02/21/18 2242 02/21/18 2318 02/22/18 0656  GLUCAP 66 115* 94    Wt Readings from Last 3 Encounters:  02/20/18 78.2 kg (172 lb 4.8 oz)  02/18/18 78.3 kg (172 lb 9.9 oz)  10/17/17 85.5 kg (188 lb 7.9 oz)    Physical Exam:  Constitutional: No distress . Vital signs reviewed. HEENT: EOMI, oral membranes moist Cardiovascular: RRR without murmur. No JVD    Respiratory: CTA Bilaterally without wheezes or rales. Normal effort    GI: BS +, non-tender, non-distended  Skin. Patient with large scar right side of neck from history of CVA Musculoskeletal: No edema or tenderness in extremities Neurological: He isalert. Patient provides his date of birth but could not give appropriate age. Delayed processing.  Dense right HH. ?neglect Motor: 4+/5 grossly throughoutexcept 0/5left ankle dorsiflexors--no change Senses LT and pain in all 4's.  Psych: flat   Assessment/Plan: 1. Decreased functional mobility and visual-spatial deficits secondary to left temporal-occipital infarct which require 3+ hours per day of interdisciplinary therapy in a  comprehensive inpatient rehab setting. Physiatrist is providing close team supervision and 24 hour management of active medical problems listed below. Physiatrist and rehab team continue to assess barriers to discharge/monitor patient progress toward functional and medical goals.  Function:  Bathing Bathing position Bathing activity did not occur: N/A Position: Shower  Bathing parts Body parts bathed by patient: Right arm, Left arm, Chest, Abdomen, Front perineal area, Right upper leg, Left upper leg, Right lower leg, Left lower leg Body parts bathed by helper: Buttocks, Back  Bathing assist Assist Level: Touching or steadying assistance(Pt > 75%)      Upper Body Dressing/Undressing Upper body dressing   What is the patient wearing?: Pull over shirt/dress     Pull over shirt/dress - Perfomed by patient: Thread/unthread right sleeve, Thread/unthread left sleeve, Put head through opening, Pull shirt over trunk          Upper body assist Assist Level: Supervision or verbal cues      Lower Body Dressing/Undressing Lower body dressing   What is the patient wearing?: Underwear, Pants, Non-skid slipper socks Underwear - Performed by patient: Thread/unthread right underwear leg, Thread/unthread left underwear leg, Pull underwear up/down   Pants- Performed by patient: Thread/unthread right pants leg, Thread/unthread left pants leg, Pull pants up/down     Non-skid slipper socks- Performed by helper: Don/doff right sock, Don/doff left sock                  Lower body assist Assist  for lower body dressing: Touching or steadying assistance (Pt > 75%)      Toileting Toileting Toileting activity did not occur: No continent bowel/bladder event   Toileting steps completed by helper: Adjust clothing prior to toileting, Performs perineal hygiene, Adjust clothing after toileting    Toileting assist Assist level: Touching or steadying assistance (Pt.75%)   Transfers Chair/bed  transfer   Chair/bed transfer method: Ambulatory Chair/bed transfer assist level: Touching or steadying assistance (Pt > 75%) Chair/bed transfer assistive device: Armrests, Patent attorney     Max distance: (62 ft) Assist level: Touching or steadying assistance (Pt > 75%)   Wheelchair   Type: Manual Max wheelchair distance: (15 ft) Assist Level: Touching or steadying assistance (Pt > 75%)  Cognition Comprehension Comprehension assist level: Understands basic 90% of the time/cues < 10% of the time  Expression Expression assist level: Expresses basic 75 - 89% of the time/requires cueing 10 - 24% of the time. Needs helper to occlude trach/needs to repeat words.  Social Interaction Social Interaction assist level: Interacts appropriately 25 - 49% of time - Needs frequent redirection.  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   -continue therapies 2. DVT Prophylaxis/Anticoagulation: SCDs. Monitor for any signs of DVT 3. Pain Management:Tylenol as needed 4. Mood:Provide emotional support as possible. Discussed mood with patient this morning. Denies depression 5. Neuropsych: This patientiscapable of making decisions on hisown behalf. 6. Skin/Wound Care:Routine skin checks 7. Fluids/Electrolytes/Nutrition:encourage PO   -appetite fair.   8.Supraventricular tachycardia with history of CAD/PCI and noted findings of aortic stenosis.  Current plan to follow up outpatient with cardiology services. Presently on aspirin and PlavixWITH PLAN TO BEGIN ELIQUIS5 mg twice daily 02/23/2018 anddecrease aspirin 81 mg daily anddiscontinue Plavix at that time 9.Diet controlled diabetes mellitus. Hemoglobin A1c 4.8. Currently with SSI.   -sugars under tight control.  10.History of urinary retention. Check PVR 3--urine now clear 11.Hyperlipidemia. Crestor 12.Constipation. Laxative assistance   LOS (Days) 2 A FACE TO FACE EVALUATION WAS PERFORMED  Ranelle Oyster, MD 02/22/2018 8:57 AM

## 2018-02-22 NOTE — Progress Notes (Signed)
Occupational Therapy Session Note  Patient Details  Name: Nicholas [name] MRN: [id number] Date of Birth: 07/13/1951  Today's Date: 02/22/2018 OT Individual Time: [id number] OT Individual Time Calculation (min): 69 min    Short Term Goals: Week 1:  OT Short Term Goal 1 (Week 1): Pt will complete toilet transfer with Min A and LRAD OT Short Term Goal 2 (Week 1): Pt will doff/don footwear with close supervision  OT Short Term Goal 3 (Week 1): Pt will scan to Rt for grooming items with mod multimodal cues   Skilled Therapeutic Interventions/Progress Updates:    Upon entering the room, pt seated in wheelchair and tearful. Pt reporting he was incontinent prior to OT arrival. OT encouraged pt to shower this session and pt agreeable. Pt performed sit >stand with min A and ambulating 10' with RW into bathroom with min A for balance, assistance to advance RW safely, and mod multimodal cues for safety awareness. Pt seated on TTB and therapist assisting pt with doffing clothing. Pt engaged in bathing from seated position and pt utilized B UE's to wash self. Min cues for initiation during session and pt standing with min A while therapist assisting pt with washing buttocks. Pt drying self and ambulating back to wheelchair to don clothing items. Pt requires increased time and min multimodal cues to locate and don appropriate clothing items this session. Pt required min A for standing balance during LB clothing management. Pt remained in wheelchair at sink for grooming tasks with focus on locating items on the R of sink. Pt required mod cues for scanning strategies to locate needed items. Pt remained in wheelchair at end of session with chair alarm activated, quick release belt donned, and call bell within reach.    Therapy Documentation Precautions:  Precautions Precautions: Fall(Simultaneous filing. User may not have seen previous data.) Precaution Comments: Rt visual field cut(Simultaneous filing. User may  not have seen previous data.) Restrictions Weight Bearing Restrictions: No ADL: ADL ADL Comments: Please see functional navigator for ADL status  See Function Navigator for Current Functional Status.   Therapy/Group: Individual Therapy  Alen BleacherBradsher, Vonne Mcdanel P 02/22/2018, 3:03 PM

## 2018-02-22 NOTE — Progress Notes (Signed)
Physical Therapy Session Note  Patient Details  Name: Nicholas [name] MRN: [id number] Date of Birth: [birth year]  Today's Date: 02/22/2018 PT Individual Time: 0806-0901 PT Individual Time Calculation (min): 55 min     Skilled Therapeutic Interventions/Progress Updates:    Session 1:  0806-0901:Session focused on promoting independence and problem solving with gait and right-sided awareness. Upon therapist entering room, pt reports that he thinks he is wet.  Therapist assisted pt in changing depends with max A and supervision and set up for peri care.  Then then performed supine to sit with min assist and transferred to w/c with min A and RW.  During gait, pt requires manual cues for weight shifting and v/c for problem solving environment due to R side neglect and general poor problem solving.  Pt also given vc for increasing step length.  Pt ambulated with RW 4 x 60 to  67 ft with RW and performed errorless learning for sit to stand transfers.  Pt denied pain at onset of session.  BP: 131/41;  HR: 61 bpm.  Session 7:3532-9924:  Vitals pre:  114/50.  Pt denies pain and voices being very happy that he could get "cleaned up" today.  Session focused on promoting independence with gait and right-sided awareness.  Pt was propelled to gym by PT to save time and ambulated again working on problem solving environmental obstacles for 2 x 70 ft with RW and then performed sit to stand for 1 x 5 with errrorless learning.  Attempted having pt draw a picture while turning towards R side of body, however, pt was unable to problem solve drawing.  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  Pt returned to bed via ambulation with RW and left in bed with needs met, 3 rails up, and call bell in reach with bed alarm set.  Therapy Documentation Precautions:  Precautions Precautions: Fall(Simultaneous filing. User may not have seen previous data.) Precaution Comments: Rt visual field  cut(Simultaneous filing. User may not have seen previous data.) Restrictions Weight Bearing Restrictions: No  See Function Navigator for Current Functional Status.   Therapy/Group: Individual Therapy  [name] Hilario Quarry 02/22/2018, 1:01 PM

## 2018-02-23 ENCOUNTER — Inpatient Hospital Stay (HOSPITAL_COMMUNITY): Payer: Medicare Other | Admitting: Physical Therapy

## 2018-02-23 LAB — GLUCOSE, CAPILLARY
GLUCOSE-CAPILLARY: 89 mg/dL (ref 65–99)
GLUCOSE-CAPILLARY: 108 mg/dL — AB (ref 65–99)
Glucose-Capillary: 66 mg/dL (ref 65–99)

## 2018-02-23 NOTE — Plan of Care (Signed)
  Progressing RH SKIN INTEGRITY RH STG SKIN FREE OF INFECTION/BREAKDOWN Description No new breakdown with min assist   02/23/2018 1114 - Progressing by Darnise Montag, Danella MaiersAshley M, RN RH SAFETY RH STG ADHERE TO SAFETY PRECAUTIONS W/ASSISTANCE/DEVICE Description STG Adhere to Safety Precautions With min Assistance/Device.  02/23/2018 1114 - Progressing by Margy Sumler, Danella MaiersAshley M, RN RH COGNITION-NURSING RH STG ANTICIPATES NEEDS/CALLS FOR ASSIST W/ASSIST/CUES Description STG Anticipates Needs/Calls for Assist With min Assistance/Cues.  02/23/2018 1114 - Progressing by Yolonda Kidaoyal, Jhett Fretwell M, RN   Not Progressing RH BLADDER ELIMINATION RH STG MANAGE BLADDER WITH ASSISTANCE Description STG Manage Bladder With Min Assistance  02/23/2018 1114 - Not Progressing by Khylie Larmore, Danella MaiersAshley M, RN

## 2018-02-23 NOTE — Progress Notes (Signed)
Hypoglycemic Event  CBG: 69  Treatment: 15 GM carbohydrate snack  Symptoms: None  Follow-up CBG: Time:2144 CBG Result:982  Possible Reasons for Event: Unknown  Comments/MD notified:treated per protocol    Alfredo MartinezMurray, Piper Albro A

## 2018-02-23 NOTE — Progress Notes (Signed)
[facility] PH[facility]ICAL MEDICINE & REHABILITATION     PROGRESS NOTE    Subjective/Complaints: Patient upset that he had some urinary incontinence overnight.  Denies urgency.  Seems frustrated and upset regarding his perceived lack of progress after the stroke.  ROS: Patient denies fever, rash, sore throat, blurred vision, nausea, vomiting, diarrhea, cough, shortness of breath or chest pain, joint or back pain, headache, or mood change. .   Objective: Vital Signs: Blood pressure (!) 130/55, pulse 75, temperature 98 F (36.7 C), temperature source Oral, resp. rate 18, height 5\' 11"  (1.803 m), weight 78.2 kg (172 lb 4.8 oz), SpO2 98 %. No results found. Recent Labs    02/21/18 0624  WBC 9.5  HGB 11.6*  HCT 33.9*  PLT 273   Recent Labs    02/21/18 0624  NA 141  K 4.1  CL 104  GLUCOSE 77  BUN 15  CREATININE 1.05  CALCIUM 9.0   CBG (last 3)  Recent Labs    02/22/18 2144 02/23/18 0635 02/23/18 0658  GLUCAP 98 66 89    Wt Readings from Last 3 Encounters:  02/20/18 78.2 kg (172 lb 4.8 oz)  02/18/18 78.3 kg (172 lb 9.9 oz)  10/17/17 85.5 kg (188 lb 7.9 oz)    Physical Exam:  Constitutional: No distress . Vital signs reviewed. HEENT: EOMI, oral membranes moist Cardiovascular: RRR without murmur. No JVD    Respiratory: CTA Bilaterally without wheezes or rales. Normal effort    GI: BS +, non-tender, non-distended  Skin. Patient with large scar right side of neck from history of CVA Musculoskeletal: No edema or tenderness in extremities Neurological: He isalert. Patient provides his date of birth but could not give appropriate age. Delayed processing.  Ongoing dense right homonymous hemianopsia Motor: 4+/5 grossly throughoutexcept 0/5left ankle dorsiflexors--no change Senses LT and pain in all 4's.  Psych: Flat and disengaged at times   Assessment/Plan: 1. Decreased functional mobility and visual-spatial deficits secondary to left temporal-occipital infarct  which require 3+ hours per day of interdisciplinary therapy in a comprehensive inpatient rehab setting. Physiatrist is providing close team supervision and 24 hour management of active medical problems listed below. Physiatrist and rehab team continue to assess barriers to discharge/monitor patient progress toward functional and medical goals.  Function:  Bathing Bathing position Bathing activity did not occur: N/A Position: Shower  Bathing parts Body parts bathed by patient: Right arm, Left arm, Chest, Abdomen, Front perineal area, Right upper leg, Left upper leg, Right lower leg, Left lower leg Body parts bathed by helper: Buttocks, Back  Bathing assist Assist Level: Touching or steadying assistance(Pt > 75%)      Upper Body Dressing/Undressing Upper body dressing   What is the patient wearing?: Pull over shirt/dress     Pull over shirt/dress - Perfomed by patient: Thread/unthread right sleeve, Thread/unthread left sleeve, Put head through opening, Pull shirt over trunk          Upper body assist Assist Level: Supervision or verbal cues, Set up   Set up : To obtain clothing/put away  Lower Body Dressing/Undressing Lower body dressing   What is the patient wearing?: Pants, Non-skid slipper socks Underwear - Performed by patient: Thread/unthread right underwear leg, Thread/unthread left underwear leg, Pull underwear up/down   Pants- Performed by patient: Thread/unthread right pants leg, Thread/unthread left pants leg, Pull pants up/down   Non-skid slipper socks- Performed by patient: Don/doff right sock, Don/doff left sock Non-skid slipper socks- Performed by helper: Don/doff right sock, Don/doff  left sock                  Lower body assist Assist for lower body dressing: Touching or steadying assistance (Pt > 75%)      Toileting Toileting Toileting activity did not occur: No continent bowel/bladder event   Toileting steps completed by helper: Adjust clothing prior  to toileting, Performs perineal hygiene, Adjust clothing after toileting    Toileting assist Assist level: Touching or steadying assistance (Pt.75%)   Transfers Chair/bed transfer   Chair/bed transfer method: Ambulatory Chair/bed transfer assist level: Touching or steadying assistance (Pt > 75%) Chair/bed transfer assistive device: Armrests, Patent attorney     Max distance: (62 ft) Assist level: Touching or steadying assistance (Pt > 75%)   Wheelchair   Type: Manual Max wheelchair distance: (15 ft) Assist Level: Touching or steadying assistance (Pt > 75%)  Cognition Comprehension Comprehension assist level: Understands basic 90% of the time/cues < 10% of the time  Expression Expression assist level: Expresses basic 75 - 89% of the time/requires cueing 10 - 24% of the time. Needs helper to occlude trach/needs to repeat words.  Social Interaction Social Interaction assist level: Interacts appropriately 75 - 89% of the time - Needs redirection for appropriate language or to initiate interaction.  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   -continue therapies 2. DVT Prophylaxis/Anticoagulation: SCDs. Monitor for any signs of DVT 3. Pain Management:Tylenol as needed 4. Mood:Provide emotional support as possible. Discussed mood with patient this morning. Denies depression   -Suspect some coping issues.  Will request neuropsychological  evaluation this week 5. Neuropsych: This patientiscapable of making decisions on hisown behalf. 6. Skin/Wound Care:Routine skin checks 7. Fluids/Electrolytes/Nutrition:encourage PO   -appetite fair.   8.Supraventricular tachycardia with history of CAD/PCI and noted findings of aortic stenosis. Current plan to follow up outpatient with cardiology services.  Begin Eliquis 5 mg twice daily today and decrease aspirin to 81 mg.  Stop Plavix.    9.Diet controlled diabetes mellitus. Hemoglobin A1c 4.8. Currently with SSI.   -sugars under tight control.    -DC CBG checks. 10.History of urinary retention.  Intermittent urinary incontinence reported by patient but not by the nurse.  Observe for pattern. 11.Hyperlipidemia. Crestor 12.Constipation. Laxative assistance   LOS (Days) 3 A FACE TO FACE EVALUATION WAS PERFORMED  Ranelle Oyster, MD 02/23/2018 8:05 AM

## 2018-02-23 NOTE — Plan of Care (Signed)
  Not Progressing RH BLADDER ELIMINATION RH STG MANAGE BLADDER WITH ASSISTANCE Description STG Manage Bladder With Min Assistance  Incontinent-requiring max assist  02/23/2018 0247 - Not Progressing by Martina SinnerMurray, Elian Gloster A, RN

## 2018-02-23 NOTE — Progress Notes (Signed)
Hypoglycemic Event  CBG: 65  Treatment: 15 GM carbohydrate snack  Symptoms: None  Follow-up CBG: [id number]:2124 CBG Result:69  Possible Reasons for Event: Unknown  Comments/MD notified:treated per protocol    Nicholas MartinezMurray, Nicholas [name]

## 2018-02-23 NOTE — Progress Notes (Signed)
Physical Therapy Session Note  Patient Details  Name: Nicholas [name] FullingWillie J Tan MRN: [id number] Date of Birth: 05/19/1951  Today's Date: 02/23/2018 PT Individual Time: 0700-0715 PT Individual Time Calculation (min): 15 min   Short Term Goals: Week 1:  PT Short Term Goal 1 (Week 1): pt will consistently perform functional transfers with min A PT Short Term Goal 2 (Week 1): pt will perform gait x 50' in controlled environment with min A  Skilled Therapeutic Interventions/Progress Updates: Pt received in bed, awake but slow to engage with therapist. Reports he slept well, but states vague frustration with unnamed staff member. Pt very emotional, stating he "doesn't even understand what's going on with his body, I can't remember all the things they want me to do, this isn't supposed to be happening to me, and they're making me feel like I'm doing all these things wrong". Offered patient emotional support, attempted to unravel the patient's vague complaints to determine if he meant "not doing the things I'm supposed to" referred to transfer method, not getting in or out of bed by himself, etc, however pt offering very little additional information, stating he didn't want to "out" anyone or get anyone in trouble. Encouraged pt to express concerns and advocate for himself, and to allow our staff an opportunity to communicate better with each other regarding expectations and ongoing education for the patient. Therapist encouraged pt to sit up and eat breakfast; pt initiated sitting up in bed but became emotional again, returning to lying down and requesting time to be left alone. Alerted RN to patient's concerns and emotional status. Will continue to follow per POC as able.      Therapy Documentation Precautions:  Precautions Precautions: Fall(Simultaneous filing. User may not have seen previous data.) Precaution Comments: Rt visual field cut(Simultaneous filing. User may not have seen previous  data.) Restrictions Weight Bearing Restrictions: No General: PT Amount of Missed Time (min): 45 Minutes PT Missed Treatment Reason: Other (Comment)(pt emotional, requesting to not participate at this time)  See Function Navigator for Current Functional Status.   Therapy/Group: Individual Therapy  Vista Lawmanlizabeth J Tygielski 02/23/2018, 7:35 AM

## 2018-02-24 ENCOUNTER — Inpatient Hospital Stay (HOSPITAL_COMMUNITY): Payer: Medicare Other | Admitting: Physical Therapy

## 2018-02-24 ENCOUNTER — Inpatient Hospital Stay (HOSPITAL_COMMUNITY): Payer: Medicare Other | Admitting: Speech Pathology

## 2018-02-24 ENCOUNTER — Inpatient Hospital Stay (HOSPITAL_COMMUNITY): Payer: Medicare Other | Admitting: Occupational Therapy

## 2018-02-24 LAB — GLUCOSE, CAPILLARY
GLUCOSE-CAPILLARY: 92 mg/dL (ref 65–99)
GLUCOSE-CAPILLARY: 132 mg/dL — AB (ref 65–99)
Glucose-Capillary: 62 mg/dL — ABNORMAL LOW (ref 65–99)
Glucose-Capillary: 77 mg/dL (ref 65–99)
Glucose-Capillary: 85 mg/dL (ref 65–99)

## 2018-02-24 MED ORDER — SENNOSIDES-DOCUSATE SODIUM 8.6-50 MG PO TABS
2.0000 | ORAL_TABLET | Freq: Every day | ORAL | Status: DC
  Administered 2018-02-24 – 2018-03-06 (×10): 2 via ORAL
  Filled 2018-02-24 (×11): qty 2

## 2018-02-24 MED ORDER — FLEET ENEMA 7-19 GM/118ML RE ENEM
1.0000 | ENEMA | Freq: Once | RECTAL | Status: AC
  Administered 2018-02-24: 1 via RECTAL
  Filled 2018-02-24: qty 1

## 2018-02-24 NOTE — Plan of Care (Deleted)
Patient is incontinent 

## 2018-02-24 NOTE — Plan of Care (Signed)
  RH BLADDER ELIMINATION RH STG MANAGE BLADDER WITH ASSISTANCE Description STG Manage Bladder With Min Assistance  02/24/2018 1246 - Not Progressing by Ross LudwigMabe, Alexi Geibel M, LPN  Pt is incont at times. Encouraged pt to call for assistance to use urinal or go to the bathroom

## 2018-02-24 NOTE — Progress Notes (Signed)
Patient very down and out needed encouragement.  Listened, offered encouragement-song [employer] by Nicholas [name].   Patient very into the conversation and expressed his feelings and anxiety over his body going through so many things.  Very positive visit.  Nicholas [name], Chaplain   02/24/18 1100  Clinical Encounter Type  Visited With Patient  Visit Type Initial;Spiritual support  Referral From Nurse  Consult/Referral To Chaplain  Spiritual Encounters  Spiritual Needs Sacred text;Prayer;Emotional (Patient needed encouragement)  Stress Factors  Patient Stress Factors Exhausted;Health changes  Family Stress Factors None identified

## 2018-02-24 NOTE — Progress Notes (Signed)
Physical Therapy Note  Patient Details  Name: Nicholas [name] MRN: [id number] Date of Birth: 10/30/1951 Today's Date: 02/24/2018    Time: 825-939 74 minutes  1:1 No c/o pain.  Pt c/o constipation, RN aware and meds given during session.  Pt performs gait with RW with min A for balance, wide BOS and shuffling steps.  Pt given visual cues of line on floor to attempt to have pt gait in straight line (pt usually veers Lt due to Rt inattention and visual deficits). Pt requires max cues to follow visual and auditory cues for straight line gait and obstacle negotiation.  Standing reaching task with focus on Rt attention with pt able to perform with close supervision for balance with 1 UE support, mod verbal cues.  Standing writing task with pt able to trace shapes with 50% accuracy, pt unable to copy and write his initials despite max multimodal cues.  nustep transfers with max cuing and min A. nustep x 6 minutes level 4 with 4 instances of needing Lt foot placed back on pedal with pt with no awareness that it had slid off.  Gait 110' to room with max cuing for directions and obstacle negotiation, min A for balance with RW.  In room pt requests to use urinal. Pt able to stand with min A, total A to hold urinal.  Pt left in bed with needs at hand, alarm on.   Azarias Chiou 02/24/2018, 9:40 AM

## 2018-02-24 NOTE — Progress Notes (Signed)
Hypoglycemic Event  CBG: 62 Treatment: 15 GM carbohydrate snack  Symptoms: Hungry  Follow-up CBG: Time:0656 CBG Result: 77  Possible Reasons for Event: Inadequate meal intake  Comments/MD notified:will monitor.     Nicholas [name], Nicholas [name]

## 2018-02-24 NOTE — Progress Notes (Signed)
Occupational Therapy Session Note  Patient Details  Name: Nicholas [name] MRN: [id number] Date of Birth: 09/30/1951  Today's Date: 02/24/2018 OT Individual Time: 0703-0800 OT Individual Time Calculation (min): 57 min    Short Term Goals: Week 1:  OT Short Term Goal 1 (Week 1): Pt will complete toilet transfer with Min A and LRAD OT Short Term Goal 2 (Week 1): Pt will doff/don footwear with close supervision  OT Short Term Goal 3 (Week 1): Pt will scan to Rt for grooming items with mod multimodal cues   Skilled Therapeutic Interventions/Progress Updates:    Upon entering the room, pt supine in bed but attempting to sit up for breakfast. Pt performed supine >sit with steady assistance to EOB. Pt seated with supervision for balance and pt requiring mod multimodal cues for visual scanning to locate food items on R side of tray. Pt having difficulty locating utensils as well if sitting them down on tray table. Pt reports, " Everything just looks like a blur and I can't tell what I am eating." Pt performed sit >stand with min A and ambulating to bathroom with verbal cues to stay within RW and for RW advancement. Pt displaying very shuffled gait for 10' into bathroom. Pt performed toilet transfer, clothing management, and hygiene with overall steady assistance for balance and min cues for sequencing and initiation. Pt unable to have BM this session. Pt donning clean pants and socks while seated on commode chair this session. Pt ambulating in same manner back to wheelchair at end of session. Chair alarm activated and quick release belt donned. Call bell within reach.  Therapy Documentation Precautions:  Precautions Precautions: Fall(Simultaneous filing. User may not have seen previous data.) Precaution Comments: Rt visual field cut(Simultaneous filing. User may not have seen previous data.) Restrictions Weight Bearing Restrictions: No ADL: ADL ADL Comments: Please see functional navigator for ADL  status  See Function Navigator for Current Functional Status.   Therapy/Group: Individual Therapy  Alen BleacherBradsher, Jozie Wulf P 02/24/2018, 8:49 AM

## 2018-02-24 NOTE — Progress Notes (Signed)
SLP Cancellation Note  Patient Details Name: Erin FullingWillie J Hunsucker MRN: [id number] DOB: 03/04/1951   Cancelled treatment:        Pt missed 60 minutes of skilled ST d/t report of "having a cold." Max support given but pt refused. SLP attempts a second time with another SLP (known to pt). Pt also refused this attempt despite support.                                                                                                 Britanie Harshman 02/24/2018, 2:22 PM

## 2018-02-24 NOTE — Progress Notes (Signed)
PHYSICAL MEDICINE & REHABILITATION     PROGRESS NOTE    Subjective/Complaints: Up in bathroom.  Complains of constipation.  ROS: Patient denies fever, rash, sore throat, blurred vision, nausea, vomiting, diarrhea, cough, shortness of breath or chest pain, joint or back pain, headache, or mood change.   Objective: Vital Signs: Blood pressure (!) 135/48, pulse 66, temperature 98.1 F (36.7 C), temperature source Oral, resp. rate 18, height 5\' 11"  (1.803 m), weight 78.2 kg (172 lb 4.8 oz), SpO2 100 %. No results found. No results for input(s): WBC, HGB, HCT, PLT in the last 72 hours. No results for input(s): NA, K, CL, GLUCOSE, BUN, CREATININE, CALCIUM in the last 72 hours.  Invalid input(s): CO CBG (last 3)  Recent Labs    02/23/18 2125 02/24/18 0624 02/24/18 0700  GLUCAP 108* 62* 77    Wt Readings from Last 3 Encounters:  02/20/18 78.2 kg (172 lb 4.8 oz)  02/18/18 78.3 kg (172 lb 9.9 oz)  10/17/17 85.5 kg (188 lb 7.9 oz)    Physical Exam:  Constitutional: No distress . Vital signs reviewed. HEENT: EOMI, oral membranes moist Cardiovascular: RRR without murmur. No JVD    Respiratory: CTA Bilaterally without wheezes or rales. Normal effort    GI: BS +, non-tender, non-distended  Skin. Patient with large scar right side of neck  Musculoskeletal: No edema or tenderness in extremities Neurological: He isalert. Delayed processing.  Ongoing dense right homonymous hemianopsia Motor: 4+/5 grossly throughoutexcept 0/5left ankle dorsiflexors--no changes Senses LT and pain in all 4's.  Psych: Flat and disengaged at times   Assessment/Plan: 1. Decreased functional mobility and visual-spatial deficits secondary to left temporal-occipital infarct which require 3+ hours per day of interdisciplinary therapy in a comprehensive inpatient rehab setting. Physiatrist is providing close team supervision and 24 hour management of active medical problems listed  below. Physiatrist and rehab team continue to assess barriers to discharge/monitor patient progress toward functional and medical goals.  Function:  Bathing Bathing position Bathing activity did not occur: N/A Position: Shower  Bathing parts Body parts bathed by patient: Right arm, Left arm, Chest, Abdomen, Front perineal area, Right upper leg, Left upper leg, Right lower leg, Left lower leg Body parts bathed by helper: Buttocks, Back  Bathing assist Assist Level: Touching or steadying assistance(Pt > 75%)      Upper Body Dressing/Undressing Upper body dressing   What is the patient wearing?: Pull over shirt/dress     Pull over shirt/dress - Perfomed by patient: Thread/unthread right sleeve, Thread/unthread left sleeve, Put head through opening, Pull shirt over trunk          Upper body assist Assist Level: Supervision or verbal cues, Set up   Set up : To obtain clothing/put away  Lower Body Dressing/Undressing Lower body dressing   What is the patient wearing?: Pants, Non-skid slipper socks Underwear - Performed by patient: Thread/unthread right underwear leg, Thread/unthread left underwear leg, Pull underwear up/down   Pants- Performed by patient: Thread/unthread right pants leg, Thread/unthread left pants leg, Pull pants up/down   Non-skid slipper socks- Performed by patient: Don/doff right sock, Don/doff left sock Non-skid slipper socks- Performed by helper: Don/doff right sock, Don/doff left sock                  Lower body assist Assist for lower body dressing: Touching or steadying assistance (Pt > 75%)      Toileting Toileting Toileting activity did not occur: No continent bowel/bladder event Toileting steps completed  by patient: Adjust clothing prior to toileting, Performs perineal hygiene, Adjust clothing after toileting Toileting steps completed by helper: Adjust clothing prior to toileting, Performs perineal hygiene, Adjust clothing after toileting     Toileting assist Assist level: Touching or steadying assistance (Pt.75%)   Transfers Chair/bed transfer   Chair/bed transfer method: Ambulatory Chair/bed transfer assist level: Touching or steadying assistance (Pt > 75%) Chair/bed transfer assistive device: Armrests, Patent attorney     Max distance: 10' Assist level: Touching or steadying assistance (Pt > 75%)   Wheelchair   Type: Manual Max wheelchair distance: (15 ft) Assist Level: Touching or steadying assistance (Pt > 75%)  Cognition Comprehension Comprehension assist level: Understands basic 90% of the time/cues < 10% of the time  Expression Expression assist level: Expresses basic 75 - 89% of the time/requires cueing 10 - 24% of the time. Needs helper to occlude trach/needs to repeat words.  Social Interaction Social Interaction assist level: Interacts appropriately 75 - 89% of the time - Needs redirection for appropriate language or to initiate interaction.  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   -continue therapies 2. DVT Prophylaxis/Anticoagulation: SCDs. Monitor for any signs of DVT 3. Pain Management:Tylenol as needed 4. Mood:Provide emotional support as possible. Discussed mood with patient this morning. Denies depression   -Suspect some coping issues.  Will request neuropsychological evaluation this week   -Consider antidepressant 5. Neuropsych: This  patientiscapable of making decisions on hisown behalf. 6. Skin/Wound Care:Routine skin checks 7. Fluids/Electrolytes/Nutrition:encourage PO   -appetite fair.   8.Supraventricular tachycardia with history of CAD/PCI and noted findings of aortic stenosis. Current plan to follow up outpatient with cardiology services.  Began Eliquis 5 mg twice daily on 3/17 with decreased aspirin to 81 mg.  Stop Plavix.    9.Diet controlled diabetes mellitus. Hemoglobin A1c 4.8. Currently with SSI.   -sugars under tight control.    -Stopped CBG checks.  Probably can liberalize his diet to regular 10.History of urinary retention.  Intermittent urinary incontinence reported by patient but not by the nurse.  Observe for pattern. 11.Hyperlipidemia. Crestor 12.Constipation. Laxative assistance   -Change Senokot-S to 2 tablets scheduled at bedtime   LOS (Days) 4 A FACE TO FACE EVALUATION WAS PERFORMED  Ranelle Oyster, MD 02/24/2018 9:02 AM

## 2018-02-25 ENCOUNTER — Inpatient Hospital Stay (HOSPITAL_COMMUNITY): Payer: Medicare Other | Admitting: Speech Pathology

## 2018-02-25 ENCOUNTER — Inpatient Hospital Stay (HOSPITAL_COMMUNITY): Payer: Medicare Other | Admitting: Physical Therapy

## 2018-02-25 ENCOUNTER — Inpatient Hospital Stay (HOSPITAL_COMMUNITY): Payer: Medicare Other | Admitting: Occupational Therapy

## 2018-02-25 DIAGNOSIS — R32 Unspecified urinary incontinence: Secondary | ICD-10-CM

## 2018-02-25 DIAGNOSIS — F329 Major depressive disorder, single episode, unspecified: Secondary | ICD-10-CM

## 2018-02-25 LAB — GLUCOSE, CAPILLARY: Glucose-Capillary: 74 mg/dL (ref 65–99)

## 2018-02-25 LAB — URINALYSIS, COMPLETE (UACMP) WITH MICROSCOPIC
BACTERIA UA: NONE SEEN
Bilirubin Urine: NEGATIVE
GLUCOSE, UA: NEGATIVE mg/dL
HGB URINE DIPSTICK: NEGATIVE
KETONES UR: NEGATIVE mg/dL
Leukocytes, UA: NEGATIVE
NITRITE: NEGATIVE
PROTEIN: NEGATIVE mg/dL
Specific Gravity, Urine: 1.016 (ref 1.005–1.030)
Squamous Epithelial / LPF: NONE SEEN
pH: 6 (ref 5.0–8.0)

## 2018-02-25 MED ORDER — ESCITALOPRAM OXALATE 10 MG PO TABS
5.0000 mg | ORAL_TABLET | Freq: Every day | ORAL | Status: DC
  Administered 2018-02-25 – 2018-02-27 (×3): 5 mg via ORAL
  Filled 2018-02-25 (×3): qty 1

## 2018-02-25 NOTE — Progress Notes (Signed)
Patient expressed he is in pain.  He isnt comfortable lying down or sitting up in bed.  He expressed frustration that people don't understand he is in terrible pain.  He shared how he was supposed to go to therapy and how he wanted to but his pain was so bad he could not do it.  He expressed concern that no one understands that his bones feel like they are rubbing together.  He feels as if people think he is trying to be uncooperative but it is not that.  He just cant endure the pain.  I  Listened, shared scripture, anointed his back with oil and prayed for relief from the pain and the ability to do his therapy when the therapists come to work with him. He was very concerned that he isnt trying to be hard to work with, it's just the pain is terrible. Phebe CollaDonna S Camrin Lapre, Chaplain   02/25/18 1600  Clinical Encounter Type  Visited With Patient  Visit Type Follow-up;Spiritual support;Other (Comment) (very discouraged)  Referral From Chaplain  Consult/Referral To Chaplain  Spiritual Encounters  Spiritual Needs Sacred text;Ritual;Other (Comment)  Stress Factors  Patient Stress Factors Other (Comment) (in terrible pain)

## 2018-02-25 NOTE — Plan of Care (Signed)
  RH BLADDER ELIMINATION RH STG MANAGE BLADDER WITH ASSISTANCE Description STG Manage Bladder With Min Assistance  02/25/2018 1202 - Not Progressing by Ross LudwigMabe, Kylina Vultaggio M, LPN  Pt has had incont episode this shift while in bed. Linens changed and pt OOB in [place]. Encouraged to use call bell to call for assistance to use urinal or go to bathroom.

## 2018-02-25 NOTE — Progress Notes (Signed)
Patient awaken with urinary incontinent episodes bed alarm on but found attempting to stand up at bedside. Disoriented to time, year and situation. Reoriented x2 ,stating he ]feel that staff is keeping him here against his will Reinforced with this patient as to why he is here in the Rehab department,states " I'm, not suppose to be here, this was not suppose to happen to me". Emotional,  And anxious support provided. Incontinent care provided, bed alarm and side railing up x3, Closely monitor. High Safety Risk

## 2018-02-25 NOTE — Progress Notes (Signed)
Physical Therapy Note  Patient Details  Name: Nicholas [name] J Molino MRN: [id number] Date of Birth: 07/18/1951 Today's Date: 02/25/2018    Time: [id number] 55 minutes  1:1 No c/o pain.  Pt states "i've been better" but is agreeable to therapy.  Pt performs gait training with attempts to have pt focus on green line on floor or objects in the environment to assist with vision and mobility.  Pt continues to states "I can't see much of anything".  PT attempts to assess vision but pt refuses to open eyes stating "this is just too much".  Pt able to gait with min A and mod/max multimodal cues with RW 3 x 150'.  Bean bag reaching task with focus on visual awareness, pt uses touch and auditory clues to find bean bags, continues to refuse to open eyes.  Attempt to have pt perform nustep.  Pt keeps stating "I just can't do this, it's too much".  Pt performs gait back to room with RW and min A, max cues.  Pt seated able to eat graham crackers and peanut butter, using touch instead of vision to find crackers on his tray.  Pt left in bed with needs at hand, call bell reviewed, alarm set.  Missed 20 minutes skilled PT due to refusal, fatigue.   [name] 02/25/2018, 10:52 AM

## 2018-02-25 NOTE — Progress Notes (Signed)
Occupational Therapy Session Note  Patient Details  Name: Nicholas [name] MRN: [id number] Date of Birth: 11/04/1951  Today's Date: 02/25/2018 OT Individual Time: 1300-1330 OT Individual Time Calculation (min): 30 min    Short Term Goals: Week 1:  OT Short Term Goal 1 (Week 1): Pt will complete toilet transfer with Min A and LRAD OT Short Term Goal 2 (Week 1): Pt will doff/don footwear with close supervision  OT Short Term Goal 3 (Week 1): Pt will scan to Rt for grooming items with mod multimodal cues   Skilled Therapeutic Interventions/Progress Updates:    Pt received in room supine in bed. Pt agreeable to OT session and transferred supine to sitting EOB with manual cues required for UE placement on bed rails to facilitate transfer. Pt then performed stand pivot transfer to w/c with significant motor planning deficits observed, and heavy tactile/verbal cues required for UE placement on w/c and sequencing LE movement. Pt performed 50f of self-propulsion in w/c with manual cues required for initiation and object maneuvering. Pt transferred from w/c to mat in therapy gym with similar motor planning deficits, requiring facilitation cues at his core to turn toward mat. Pt participated in bilateral manipulation task with a focus on shoulder abduction with frequent vc for visual attention to task. Attempted to engage pt in bimanual badmitton game, with pt perseverating on end of session and exhibiting difficulty initiating task bimanually. Pt returned to room, with bed alarm activated and all needs met.    Therapy Documentation Precautions:  Precautions Precautions: Fall(Simultaneous filing. User may not have seen previous data.) Precaution Comments: Rt visual field cut(Simultaneous filing. User may not have seen previous data.) Restrictions Weight Bearing Restrictions: No   Vital Signs: Therapy Vitals Temp: 98.1 F (36.7 C) Temp Source: Oral Pulse Rate: 71 Resp: 18 BP: (!)  115/48 Patient Position (if appropriate): Lying Oxygen Therapy SpO2: 98 % O2 Device: Room Air Pain: Pain Assessment Pain Assessment: No/denies pain Pain Score: 0-No pain ADL: ADL ADL Comments: Please see functional navigator for ADL status  See Function Navigator for Current Functional Status.   Therapy/Group: Individual Therapy  SCurtis Sites3/19/2019, 2:37 PM

## 2018-02-25 NOTE — Progress Notes (Signed)
Speech Language Pathology Daily Session Note  Patient Details  Name: Nicholas [name] MRN: [id number] Date of Birth: 04/24/1951  Today's Date: 02/25/2018 SLP Individual Time: [id number] SLP Individual Time Calculation (min): 45 min  Short Term Goals: Week 1: SLP Short Term Goal 1 (Week 1): Pt will demonstrate intellectual awareness by identifying 2 physical and cognitive deficits with Mod A verbal cues.  SLP Short Term Goal 2 (Week 1): Pt will complete basic problem solving tasks with Mod A verbal cues.  SLP Short Term Goal 3 (Week 1): Pt will recall daily new information with utilization of external aids with Mod A verbal cues.  SLP Short Term Goal 4 (Week 1): Pt will demonstrate sustained attention for 10 minutes during functional tasks with Mod A verbal cues.  SLP Short Term Goal 5 (Week 1): Pt will attend to right field of environment during functional tasks with Mod A verbal cues.    Skilled Therapeutic Interventions: Skilled treatment session focused on cognitive goals. SLP facilitated session by providing Mod A verbal cues for visual scanning and problem solving during self-feeding. Pt able to attend to right field of environment with extra time and Mod A verbal cues throughout session. Pt demonstrated sustained attention to breakfast for ~20 minutes with Min A verbal cues. Pt left upright in wheelchair with alarm on and quick release belt in place. Continue with current plan of care.      Function:  Cognition Comprehension Comprehension assist level: Understands basic 90% of the time/cues < 10% of the time  Expression   Expression assist level: Expresses basic 75 - 89% of the time/requires cueing 10 - 24% of the time. Needs helper to occlude trach/needs to repeat words.  Social Interaction Social Interaction assist level: Interacts appropriately 75 - 89% of the time - Needs redirection for appropriate language or to initiate interaction.  Problem Solving Problem solving assist  level: Solves basic 50 - 74% of the time/requires cueing 25 - 49% of the time  Memory Memory assist level: Recognizes or recalls 25 - 49% of the time/requires cueing 50 - 75% of the time    Pain Pain Assessment: No/denies pain   Therapy/Group: Individual Therapy  Roxan HockeyYahui Edwen Mclester  SLP - Student 02/25/2018, 12:24 PM

## 2018-02-25 NOTE — Progress Notes (Signed)
[place] PHYSICAL MEDICINE & REHABILITATION     PROGRESS NOTE    Subjective/Complaints: Pt at bedside eating breakfast with SLP. Up last night with incontinence, disorientation per RN  ROS: Limited due to cognitive/behavioral    Objective: Vital Signs: Blood pressure (!) 136/54, pulse 73, temperature 98.2 F (36.8 C), temperature source Oral, resp. rate 18, height 5\' 11"  (1.803 m), weight 78.2 kg (172 lb 4.8 oz), SpO2 98 %. No results found. No results for input(s): WBC, HGB, HCT, PLT in the last 72 hours. No results for input(s): NA, K, CL, GLUCOSE, BUN, CREATININE, CALCIUM in the last 72 hours.  Invalid input(s): CO CBG (last 3)  Recent Labs    02/24/18 1642 02/24/18 2053 02/25/18 0636  GLUCAP 92 132* 74    Wt Readings from Last 3 Encounters:  02/20/18 78.2 kg (172 lb 4.8 oz)  02/18/18 78.3 kg (172 lb 9.9 oz)  10/17/17 85.5 kg (188 lb 7.9 oz)    Physical Exam:  Constitutional: No distress . Vital signs reviewed. HEENT: EOMI, oral membranes moist Cardiovascular: RRR without murmur. No JVD    Respiratory: CTA Bilaterally without wheezes or rales. Normal effort    GI: BS +, non-tender, non-distended   Skin. Patient with large scar right side of neck  Musculoskeletal: No edema or tenderness in extremities Neurological: He isalert. Delayed processing.  dense right homonymous hemianopsia Motor: 4+/5 grossly throughoutexcept 0/5left ankle dorsiflexors--no changes Senses LT and pain in all 4's.  Psych: Flat but cooperative   Assessment/Plan: 1. Decreased functional mobility and visual-spatial deficits secondary to left temporal-occipital infarct which require 3+ hours per day of interdisciplinary therapy in a comprehensive inpatient rehab setting. Physiatrist is providing close team supervision and 24 hour management of active medical problems listed below. Physiatrist and rehab team continue to assess barriers to discharge/monitor patient progress toward  functional and medical goals.  Function:  Bathing Bathing position Bathing activity did not occur: N/A Position: Shower  Bathing parts Body parts bathed by patient: Right arm, Left arm, Chest, Abdomen, Front perineal area, Right upper leg, Left upper leg, Right lower leg, Left lower leg Body parts bathed by helper: Buttocks, Back  Bathing assist Assist Level: Touching or steadying assistance(Pt > 75%)      Upper Body Dressing/Undressing Upper body dressing   What is the patient wearing?: Pull over shirt/dress     Pull over shirt/dress - Perfomed by patient: Thread/unthread right sleeve, Thread/unthread left sleeve, Put head through opening, Pull shirt over trunk          Upper body assist Assist Level: Supervision or verbal cues, Set up   Set up : To obtain clothing/put away  Lower Body Dressing/Undressing Lower body dressing   What is the patient wearing?: Pants, Non-skid slipper socks Underwear - Performed by patient: Thread/unthread right underwear leg, Thread/unthread left underwear leg, Pull underwear up/down   Pants- Performed by patient: Thread/unthread right pants leg, Thread/unthread left pants leg, Pull pants up/down   Non-skid slipper socks- Performed by patient: Don/doff right sock, Don/doff left sock Non-skid slipper socks- Performed by helper: Don/doff right sock, Don/doff left sock                  Lower body assist Assist for lower body dressing: Touching or steadying assistance (Pt > 75%)      Toileting Toileting Toileting activity did not occur: No continent bowel/bladder event Toileting steps completed by patient: Adjust clothing prior to toileting, Performs perineal hygiene, Adjust clothing after toileting Toileting  steps completed by helper: Adjust clothing prior to toileting, Adjust clothing after toileting(per Lenord Fellers, NT)    Toileting assist Assist level: Touching or steadying assistance (Pt.75%)   Transfers Chair/bed transfer    Chair/bed transfer method: Ambulatory Chair/bed transfer assist level: Touching or steadying assistance (Pt > 75%) Chair/bed transfer assistive device: Armrests, Patent attorney     Max distance: 10' Assist level: Touching or steadying assistance (Pt > 75%)   Wheelchair   Type: Manual Max wheelchair distance: (15 ft) Assist Level: Touching or steadying assistance (Pt > 75%)  Cognition Comprehension Comprehension assist level: Understands basic 90% of the time/cues < 10% of the time  Expression Expression assist level: Expresses basic 75 - 89% of the time/requires cueing 10 - 24% of the time. Needs helper to occlude trach/needs to repeat words.  Social Interaction Social Interaction assist level: Interacts appropriately 75 - 89% of the time - Needs redirection for appropriate language or to initiate interaction.  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   -continue therapies 2. DVT Prophylaxis/Anticoagulation: SCDs. Monitor for any signs of DVT 3. Pain Management:Tylenol as needed 4. Mood:Provide emotional support as possible. Discussed mood with patient this morning. Denies depression   -Suspect some coping issues.  Will request neuropsychological evaluation this week   -add low dose lexapro at HS 5. Neuropsych: This patientiscapable of making decisions on hisown behalf. 6. Skin/Wound Care:Routine skin checks 7.  Fluids/Electrolytes/Nutrition:encourage PO   -appetite fair.   8.Supraventricular tachycardia with history of CAD/PCI and noted findings of aortic stenosis. Current plan to follow up outpatient with cardiology services.  Began Eliquis 5 mg twice daily on 3/17 with decreased aspirin to 81 mg.  Stop Plavix.    9.Diet controlled diabetes mellitus. Hemoglobin A1c 4.8. Currently with SSI.   -sugars under tight control.    -Stopped CBG checks.  Diet changed to regular 10.History of urinary retention.  Intermittent urinary incontinence.    -check ua/ucx 11.Hyperlipidemia. Crestor 12.Constipation. Laxative assistance   -Changed Senokot-S to 2 tablets scheduled at bedtime   LOS (Days) 5 A FACE TO FACE EVALUATION WAS PERFORMED  Ranelle Oyster, MD 02/25/2018 9:30 AM

## 2018-02-25 NOTE — Progress Notes (Signed)
Physical Therapy Session Note  Patient Details  Name: Nicholas [name] MRN: [id number] Date of Birth: 10/24/1951  Today's Date: 02/25/2018 PT Individual Time: 1536-1600 PT Individual Time Calculation (min): 24 min   Short Term Goals: Week 1:  PT Short Term Goal 1 (Week 1): pt will consistently perform functional transfers with min A PT Short Term Goal 2 (Week 1): pt will perform gait x 50' in controlled environment with min A  Skilled Therapeutic Interventions/Progress Updates:  Pt received in bed & agreeable to tx. Pt transferred OOB with extra time and transferred bed>w/c via stand pivot with min assist with decreased weight shifting and ability to advance feet. Transported pt to gym where pt reported c/o low back pain while sitting in w/c -- RN made aware & meds requested. Pt reports "I'll do whatever you want me to do" but when encouraged to ambulate or perform stair negotiation pt reports c/o pain. Pt agreeable to use nu-step and transported to dayroom via w/c total assist. Pt completes w/c<>nu-step with min assist with max multimodal cuing for pivoting and to safely sit in seat 2/2 impaired vision. Pt requires max assist for LE placement on pedals and encouraged to use device with BLE only. Pt unable to fully extend L knee and continues to report low back pain, also reporting "y'all don't know how much pain I'm in". Pt assisted back to room via w/c total assist. Pt completes sit>supine with supervision and significantly extra time. Pt left in bed with alarm set & needs within reach.  Therapy Documentation Precautions:  Precautions Precautions: Fall(Simultaneous filing. User may not have seen previous data.) Precaution Comments: Rt visual field cut(Simultaneous filing. User may not have seen previous data.) Restrictions Weight Bearing Restrictions: No   See Function Navigator for Current Functional Status.   Therapy/Group: Individual Therapy  Sandi MariscalVictoria M Tecumseh Yeagley 02/25/2018, 4:22 PM

## 2018-02-26 ENCOUNTER — Inpatient Hospital Stay (HOSPITAL_COMMUNITY): Payer: Medicare Other | Admitting: Speech Pathology

## 2018-02-26 ENCOUNTER — Inpatient Hospital Stay (HOSPITAL_COMMUNITY): Payer: Medicare Other | Admitting: Physical Therapy

## 2018-02-26 ENCOUNTER — Other Ambulatory Visit: Payer: Self-pay | Admitting: Cardiology

## 2018-02-26 ENCOUNTER — Inpatient Hospital Stay (HOSPITAL_COMMUNITY): Payer: Medicare Other | Admitting: Occupational Therapy

## 2018-02-26 DIAGNOSIS — I6310 Cerebral infarction due to embolism of unspecified precerebral artery: Secondary | ICD-10-CM

## 2018-02-26 LAB — URINE CULTURE

## 2018-02-26 NOTE — Patient Care Conference (Addendum)
Inpatient RehabilitationTeam Conference and Plan of Care Update Date: 02/25/2018   Time: 2:40 PM    Patient Name: Nicholas [name]      Medical Record Number: [id number]  Date of Birth: [birth year] Sex: Male         Room/Bed: 4W10C/4W10C-01 Payor Info: Payor: MEDICARE / Plan: MEDICARE PART A AND B / Product Type: *No Product type* /    Admitting Diagnosis: CVA  Admit Date/Time:  02/20/2018  5:47 PM Admission Comments: No comment available   Primary Diagnosis:  <principal problem not specified> Principal Problem: <principal problem not specified>  Patient Active Problem List   Diagnosis Date Noted  . Aortic stenosis 02/20/2018  . Occipital stroke (HCC) 02/20/2018  . Posterior cerebral artery embolism, left   . Right homonymous hemianopsia   . Gait disturbance, post-stroke   . Acquired left foot drop   . Urinary retention   . Diabetes mellitus type 2 in nonobese (HCC)   . Acute blood loss anemia   . Diastolic dysfunction   . Nonrheumatic aortic valve stenosis   . Elevated troponin 02/18/2018  . Stenosis of left carotid artery   . Carotid occlusion, right   . Diabetes mellitus (HCC)   . CVA (cerebral vascular accident) (HCC) 02/17/2018  . Pressure injury of skin 10/28/2017  . SBO (small bowel obstruction) (HCC) 10/17/2017  . Bacterial UTI 03/19/2017  . Adjustment disorder   . Slow transit constipation   . Weakness of both lower extremities   . Bilateral foot-drop   . Lumbar radiculopathy 03/13/2017  . Lumbar spinal stenosis 03/13/2017  . Foot drop, right   . Urinary retention due to benign prostatic hyperplasia   . Benign essential HTN   . History of CVA (cerebrovascular accident) 03/11/2017  . Cognitive deficits, late effect of cerebrovascular disease 07/23/2014  . 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 PSVT (paroxysmal supraventricular tachycardia) (HCC) 09/15/2010  . Aortic valve disorder 08/09/2010  . PVD (peripheral vascular disease) (HCC)  08/09/2010  . Hyperlipidemia 08/08/2010  . TOBACCO ABUSE 08/08/2010  . Essential hypertension 08/08/2010  . CAD, NATIVE VESSEL 08/08/2010  . Occlusion and stenosis of carotid artery 08/08/2010    Expected Discharge Date: Expected Discharge Date: 03/04/18  Team Members Present: Physician leading conference: Dr. Faith Rogue Social Worker Present: Amada Jupiter, LCSW Nurse Present: Allayne Stack, RN PT Present: Judieth Keens, PT OT Present: Roney Mans, OT SLP Present: Feliberto Gottron, SLP PPS Coordinator present : Tora Duck, RN, CRRN     Current Status/Progress Goal Weekly Team Focus  Medical   Occipital stroke with right homonymous hemianopsia as well as associated cognitive deficits.  Suspect reactive depression.  Severe aortic stenosis  Optimizing nutrition and sleep.  Intermittent urinary incontinence with history of urine retention.  Checking postvoid residuals and urine sample   Bowel/Bladder   reported Continency during day, incontinent during HS    Mantain continency throughtout  Assess and offer urinal Q2hrs and prn,   Swallow/Nutrition/ Hydration             ADL's   min A overall with functional transfers and self care- mod cues secondary to visual cut  Supervision overall  ADL retraining, balnce, functional transfers/mobility, cognition, vision strategies   Mobility   min A with gait, mod/max cuing due to visual cut, attention and awareness  supervision overall  functional activities, gait, awareness   Communication             Safety/Cognition/ Behavioral Observations  Max A  Min A   orientation, attention, visual scanning/attention, problem solving    Pain   denies   < 2  Assess QS and prn, provide medication as prdered and reassess outcomes    Skin   no skin issues  maintain skin integrity while on rehab  Assess QS and prn report changes and provived adequate treament and followup    Rehab Goals Patient on target to meet rehab goals: Yes *See Care  Plan and progress notes for long and short-term goals.     Barriers to Discharge  Current Status/Progress Possible Resolutions Date Resolved   Physician    Medical stability        Treat medical conditions above      Nursing                  PT                    OT                  SLP                SW                Discharge Planning/Teaching Needs:  Pt plans to d/c to daughter's home.  She and her spouse work alternate shifts and can provide "close to" 24/7 support.    Teaching needs TBD closer to d/c.   Team Discussion:  Pt continues to present very depressed;  Significant visual/ spatial deficits.  Tx report that if not doing a functional task then he can "shut down" his participation.  Currently min assist with rw and MAX cueing.  Will need 24/7 assistance and may still have some incontinence at d/c.  SW to follow up with family about whether they feel they can meet these care needs for pt.  Revisions to Treatment Plan:  None    Continued Need for Acute Rehabilitation Level of Care: The patient requires daily medical management by a physician with specialized training in physical medicine and rehabilitation for the following conditions: Daily direction of a multidisciplinary physical rehabilitation program to ensure safe treatment while eliciting the highest outcome that is of practical value to the patient.: Yes Daily medical management of patient stability for increased activity during participation in an intensive rehabilitation regime.: Yes Daily analysis of laboratory values and/or radiology reports with any subsequent need for medication adjustment of medical intervention for : Urological problems  Nicholas [name] 02/26/2018, 12:11 PM

## 2018-02-26 NOTE — Consult Note (Signed)
WOC Nurse wound consult note Reason for Consult: Consult requested for sacrum.  Pt is very thin with protruding sacrum bone.  He is frequently incontinent of loose stools and it is difficult to keep wound from becoming soiled related to close proximity to the rectum. Wound type: Stage 2  Pressure Injury POA: No Measurement: .5X.5X.1cm Wound bed: pink and moist Drainage (amount, consistency, odor) small amt tan drainage, no odor   Periwound: intact skin surrounding Dressing procedure/placement/frequency: Foam dressing to protect from further injury and promote healing.  Discussed plan of care with patient who verbalized understanding. Please re-consult if further assistance is needed.  Thank-you,  Cammie Mcgeeawn Alashia Brownfield MSN, RN, CWOCN, [place], CNS [phone]

## 2018-02-26 NOTE — Discharge Instructions (Signed)
STROKE/TIA DISCHARGE INSTRUCTIONS SMOKING Cigarette smoking nearly doubles your risk of having a stroke & is the single most alterable risk factor  If you smoke or have smoked in the last 12 months, you are advised to quit smoking for your health.  Most of the excess cardiovascular risk related to smoking disappears within a year of stopping.  Ask you doctor about anti-smoking medications  [place] Quit Line: 1-800-QUIT NOW  Free Smoking Cessation Classes [phone]  CHOLESTEROL Know your levels; limit fat & cholesterol in your diet  Lipid Panel     Component Value Date/Time   CHOL 94 02/18/2018 0549   TRIG 19 02/18/2018 0549   HDL 50 02/18/2018 0549   CHOLHDL 1.9 02/18/2018 0549   VLDL 4 02/18/2018 0549   LDLCALC 40 02/18/2018 0549      Many patients benefit from treatment even if their cholesterol is at goal.  Goal: Total Cholesterol (CHOL) less than 160  Goal:  Triglycerides (TRIG) less than 150  Goal:  HDL greater than 40  Goal:  LDL (LDLCALC) less than 100   BLOOD PRESSURE American Stroke Association blood pressure target is less that 120/80 mm/Hg  Your discharge blood pressure is:  BP: (!) 135/58  Monitor your blood pressure  Limit your salt and alcohol intake  Many individuals will require more than one medication for high blood pressure  DIABETES (A1c is a blood sugar average for last 3 months) Goal HGBA1c is under 7% (HBGA1c is blood sugar average for last 3 months)  Diabetes: No known diagnosis of diabetes    Lab Results  Component Value Date   HGBA1C 4.8 02/18/2018     Your HGBA1c can be lowered with medications, healthy diet, and exercise.  Check your blood sugar as directed by your physician  Call your physician if you experience unexplained or low blood sugars.  PHYSICAL ACTIVITY/REHABILITATION Goal is 30 minutes at least 4 days per week  Activity: Increase activity slowly, Therapies: Physical Therapy: Home Health Return to work:   Activity  decreases your risk of heart attack and stroke and makes your heart stronger.  It helps control your weight and blood pressure; helps you relax and can improve your mood.  Participate in a regular exercise program.  Talk with your doctor about the best form of exercise for you (dancing, walking, swimming, cycling).  DIET/WEIGHT Goal is to maintain a healthy weight  Your discharge diet is: Fall precautions Diet regular Room service appropriate? Yes; Fluid consistency: Thin  liquids Your height is:  Height: 5\' 11"  (180.3 cm) Your current weight is: Weight: 75 kg (165 lb 5.5 oz) Your Body Mass Index (BMI) is:  BMI (Calculated): 23.07  Following the type of diet specifically designed for you will help prevent another stroke.  Your goal weight range is:    Your goal Body Mass Index (BMI) is 19-24.  Healthy food habits can help reduce 3 risk factors for stroke:  High cholesterol, hypertension, and excess weight.  RESOURCES Stroke/Support Group:  Call [phone]   STROKE EDUCATION PROVIDED/REVIEWED AND GIVEN TO PATIENT Stroke warning signs and symptoms How to activate emergency medical system (call 911). Medications prescribed at discharge. Need for follow-up after discharge. Personal risk factors for stroke. Pneumonia vaccine given:  Flu vaccine given:  My questions have been answered, the writing is legible, and I understand these instructions.  I will adhere to these goals & educational materials that have been provided to me after my discharge from the hospital.  Inpatient Rehab Discharge Instructions  Aqil Goetting [place] Discharge date and time: No discharge date for patient encounter.   Activities/Precautions/ Functional Status: Activity: activity as tolerated Diet: regular diet Wound Care: none needed Functional status:  ___ No restrictions     ___ Walk up steps independently ___ 24/7 supervision/assistance   ___ Walk up steps with assistance ___ Intermittent  supervision/assistance  ___ Bathe/dress independently ___ Walk with walker     _x__ Bathe/dress with assistance ___ Walk Independently    ___ Shower independently ___ Walk with assistance    ___ Shower with assistance ___ No alcohol     ___ Return to work/school ________  Special Instructions: No driving   My questions have been answered and I understand these instructions. I will adhere to these goals and the provided educational materials after my discharge from the hospital.  Patient/Caregiver Signature _______________________________ Date __________  Clinician Signature _______________________________________ Date __________  Please bring this form and your medication list with you to all your follow-up doctor's appointments. Information on my medicine - ELIQUIS (apixaban)  This medication education was reviewed with me or my healthcare representative as part of my discharge preparation.    Why was Eliquis prescribed for you? Eliquis was prescribed for you to reduce the risk of forming blood clots that can cause a stroke.  What do You need to know about Eliquis ? Take your Eliquis TWICE DAILY - one tablet in the morning and one tablet in the evening with or without food.  It would be best to take the doses about the same time each day.  If you have difficulty swallowing the tablet whole please discuss with your pharmacist how to take the medication safely.  Take Eliquis exactly as prescribed by your doctor and DO NOT stop taking Eliquis without talking to the doctor who prescribed the medication.  Stopping may increase your risk of developing a new clot or stroke.  Refill your prescription before you run out.  After discharge, you should have regular check-up appointments with your healthcare provider that is prescribing your Eliquis.  In the future your dose may need to be changed if your kidney function or weight changes by a significant amount or as you get older.  What  do you do if you miss a dose? If you miss a dose, take it as soon as you remember on the same day and resume taking twice daily.  Do not take more than one dose of ELIQUIS at the same time.  Important Safety Information A possible side effect of Eliquis is bleeding. You should call your healthcare provider right away if you experience any of the following: ? Bleeding from an injury or your nose that does not stop. ? Unusual colored urine (red or dark brown) or unusual colored stools (red or black). ? Unusual bruising for unknown reasons. ? A serious fall or if you hit your head (even if there is no bleeding).  Some medicines may interact with Eliquis and might increase your risk of bleeding or clotting while on Eliquis. To help avoid this, consult your healthcare provider or pharmacist prior to using any new prescription or non-prescription medications, including herbals, vitamins, non-steroidal anti-inflammatory drugs (NSAIDs) and supplements.  This website has more information on Eliquis (apixaban): [email or web address].com.cy.

## 2018-02-26 NOTE — Progress Notes (Signed)
Social Work Patient ID: Nicholas FullingWillie J [name], male   DOB: 02/09/1951, 67 y.o.   MRN: [id number]    Nicholas [name], Jarrett Chicoine R, LCSW  Social Worker  General Practice  Patient Care Conference  Signed  Date of Service:  02/26/2018 12:11 PM          Signed          [] Hide copied text  [] Hover for details   Inpatient RehabilitationTeam Conference and Plan of Care Update Date: 02/25/2018   Time: 2:40 PM      Patient Name: Nicholas [name]      Medical Record Number: [id number]  Date of Birth: 10/16/1951 Sex: Male         Room/Bed: 4W10C/4W10C-01 Payor Info: Payor: MEDICARE / Plan: MEDICARE PART A AND B / Product Type: *No Product type* /     Admitting Diagnosis: CVA  Admit Date/Time:  02/20/2018  5:47 PM Admission Comments: No comment available    Primary Diagnosis:  <principal problem not specified> Principal Problem: <principal problem not specified>       Patient Active Problem List    Diagnosis Date Noted  . Aortic stenosis 02/20/2018  . Occipital stroke (HCC) 02/20/2018  . Posterior cerebral artery embolism, left    . Right homonymous hemianopsia    . Gait disturbance, post-stroke    . Acquired left foot drop    . Urinary retention    . Diabetes mellitus type 2 in nonobese (HCC)    . Acute blood loss anemia    . Diastolic dysfunction    . Nonrheumatic aortic valve stenosis    . Elevated troponin 02/18/2018  . Stenosis of left carotid artery    . Carotid occlusion, right    . Diabetes mellitus (HCC)    . CVA (cerebral vascular accident) (HCC) 02/17/2018  . Pressure injury of skin 10/28/2017  . SBO (small bowel obstruction) (HCC) 10/17/2017  . Bacterial UTI 03/19/2017  . Adjustment disorder    . Slow transit constipation    . Weakness of both lower extremities    . Bilateral foot-drop    . Lumbar radiculopathy 03/13/2017  . Lumbar spinal stenosis 03/13/2017  . Foot drop, right    . Urinary retention due to benign prostatic hyperplasia    . Benign essential HTN    .  History of CVA (cerebrovascular accident) 03/11/2017  . Cognitive deficits, late effect of cerebrovascular disease 07/23/2014  . 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 PSVT (paroxysmal supraventricular tachycardia) (HCC) 09/15/2010  . Aortic valve disorder 08/09/2010  . PVD (peripheral vascular disease) (HCC) 08/09/2010  . Hyperlipidemia 08/08/2010  . TOBACCO ABUSE 08/08/2010  . Essential hypertension 08/08/2010  . CAD, NATIVE VESSEL 08/08/2010  . Occlusion and stenosis of carotid artery 08/08/2010      Expected Discharge Date: Expected Discharge Date: 03/08/18   Team Members Present: Physician leading conference: Dr. Faith RogueZachary Swartz Social Worker Present: Amada JupiterLucy Dorethea Strubel, LCSW Nurse Present: Allayne Stackhelsey Evans, RN PT Present: Judieth KeensKaren Donaworth, PT OT Present: Roney MansJennifer Smith, OT SLP Present: Feliberto Gottronourtney Payne, SLP PPS Coordinator present : Tora DuckMarie Noel, RN, CRRN       Current Status/Progress Goal Weekly Team Focus  Medical     Occipital stroke with right homonymous hemianopsia as well as associated cognitive deficits.  Suspect reactive depression.  Severe aortic stenosis  Optimizing nutrition and sleep.  Intermittent urinary incontinence with history of urine retention.  Checking postvoid residuals and urine sample   Bowel/Bladder     reported Continency during  day, incontinent during HS    Mantain continency throughtout  Assess and offer urinal Q2hrs and prn,   Swallow/Nutrition/ Hydration               ADL's     min A overall with functional transfers and self care- mod cues secondary to visual cut  Supervision overall  ADL retraining, balnce, functional transfers/mobility, cognition, vision strategies   Mobility     min A with gait, mod/max cuing due to visual cut, attention and awareness  supervision overall  functional activities, gait, awareness   Communication               Safety/Cognition/ Behavioral Observations   Max A  Min A   orientation, attention, visual  scanning/attention, problem solving    Pain     denies   < 2  Assess QS and prn, provide medication as prdered and reassess outcomes    Skin     no skin issues  maintain skin integrity while on rehab  Assess QS and prn report changes and provived adequate treament and followup     Rehab Goals Patient on target to meet rehab goals: Yes *See Care Plan and progress notes for long and short-term goals.      Barriers to Discharge   Current Status/Progress Possible Resolutions Date Resolved   Physician     Medical stability        Treat medical conditions above      Nursing                 PT                    OT                 SLP            SW              Discharge Planning/Teaching Needs:  Pt plans to d/c to daughter's home.  She and her spouse work alternate shifts and can provide "close to" 24/7 support.    Teaching needs TBD closer to d/c.   Team Discussion:  Pt continues to present very depressed;  Significant visual/ spatial deficits.  Tx report that if not doing a functional task then he can "shut down" his participation.  Currently min assist with rw and MAX cueing.  Will need 24/7 assistance and may still have some incontinence at d/c.  SW to follow up with family about whether they feel they can meet these care needs for pt.  Revisions to Treatment Plan:  None    Continued Need for Acute Rehabilitation Level of Care: The patient requires daily medical management by a physician with specialized training in physical medicine and rehabilitation for the following conditions: Daily direction of a multidisciplinary physical rehabilitation program to ensure safe treatment while eliciting the highest outcome that is of practical value to the patient.: Yes Daily medical management of patient stability for increased activity during participation in an intensive rehabilitation regime.: Yes Daily analysis of laboratory values and/or radiology reports with any subsequent need for  medication adjustment of medical intervention for : Urological problems   [name] 02/26/2018, 12:11 PM

## 2018-02-26 NOTE — Progress Notes (Signed)
[place] PHYSICAL MEDICINE & REHABILITATION     PROGRESS NOTE    Subjective/Complaints: Up with therapy. Has questions about vision. Asked when it might return  ROS: Patient denies fever, rash, sore throat, blurred vision, nausea, vomiting, diarrhea, cough, shortness of breath or chest pain, joint or back pain, headache, or mood change.   Objective: Vital Signs: Blood pressure (!) 106/54, pulse 68, temperature 98.2 F (36.8 C), temperature source Oral, resp. rate 17, height 5\' 11"  (1.803 m), weight 75 kg (165 lb 5.5 oz), SpO2 99 %. No results found. No results for input(s): WBC, HGB, HCT, PLT in the last 72 hours. No results for input(s): NA, K, CL, GLUCOSE, BUN, CREATININE, CALCIUM in the last 72 hours.  Invalid input(s): CO CBG (last 3)  Recent Labs    02/24/18 1642 02/24/18 2053 02/25/18 0636  GLUCAP 92 132* 74    Wt Readings from Last 3 Encounters:  02/26/18 75 kg (165 lb 5.5 oz)  02/18/18 78.3 kg (172 lb 9.9 oz)  10/17/17 85.5 kg (188 lb 7.9 oz)    Physical Exam:  Constitutional: No distress . Vital signs reviewed. HEENT: EOMI, oral membranes moist Cardiovascular: RRR with extrasystolic murmur. No JVD    Respiratory: CTA Bilaterally without wheezes or rales. Normal effort    GI: BS +, non-tender, non-distended   Skin. Patient with large scar right side of neck  Musculoskeletal: No edema or tenderness in extremities Neurological: He isalert. Delayed processing. Impaired insight and awareness dense right homonymous hemianopsia which is unchanged Motor: 4+/5 grossly throughoutexcept 0/5left ankle dorsiflexors--stable exam Senses LT and pain in all 4's.  Psych: Flat but cooperative   Assessment/Plan: 1. Decreased functional mobility and visual-spatial deficits secondary to left temporal-occipital infarct which require 3+ hours per day of interdisciplinary therapy in a comprehensive inpatient rehab setting. Physiatrist is providing close team supervision  and 24 hour management of active medical problems listed below. Physiatrist and rehab team continue to assess barriers to discharge/monitor patient progress toward functional and medical goals.  Function:  Bathing Bathing position Bathing activity did not occur: N/A Position: Shower  Bathing parts Body parts bathed by patient: Right arm, Left arm, Chest, Abdomen, Front perineal area, Right upper leg, Left upper leg, Right lower leg, Left lower leg Body parts bathed by helper: Buttocks, Back  Bathing assist Assist Level: Touching or steadying assistance(Pt > 75%)      Upper Body Dressing/Undressing Upper body dressing   What is the patient wearing?: Pull over shirt/dress     Pull over shirt/dress - Perfomed by patient: Thread/unthread right sleeve, Thread/unthread left sleeve, Put head through opening, Pull shirt over trunk          Upper body assist Assist Level: Supervision or verbal cues, Set up   Set up : To obtain clothing/put away  Lower Body Dressing/Undressing Lower body dressing   What is the patient wearing?: Pants, Non-skid slipper socks Underwear - Performed by patient: Thread/unthread right underwear leg, Thread/unthread left underwear leg, Pull underwear up/down   Pants- Performed by patient: Thread/unthread right pants leg, Thread/unthread left pants leg, Pull pants up/down   Non-skid slipper socks- Performed by patient: Don/doff right sock, Don/doff left sock Non-skid slipper socks- Performed by helper: Don/doff right sock, Don/doff left sock                  Lower body assist Assist for lower body dressing: Touching or steadying assistance (Pt > 75%)      Toileting Toileting Toileting activity  did not occur: No continent bowel/bladder event Toileting steps completed by patient: Adjust clothing prior to toileting, Performs perineal hygiene, Adjust clothing after toileting Toileting steps completed by helper: Adjust clothing prior to toileting, Adjust  clothing after toileting Toileting Assistive Devices: Grab bar or rail  Toileting assist Assist level: Touching or steadying assistance (Pt.75%)   Transfers Chair/bed transfer   Chair/bed transfer method: Stand pivot Chair/bed transfer assist level: Touching or steadying assistance (Pt > 75%) Chair/bed transfer assistive device: Armrests     Locomotion Ambulation     Max distance: 10' Assist level: Touching or steadying assistance (Pt > 75%)   Wheelchair   Type: Manual Max wheelchair distance: 36ft Assist Level: Touching or steadying assistance (Pt > 75%)  Cognition Comprehension Comprehension assist level: Understands basic 90% of the time/cues < 10% of the time  Expression Expression assist level: Expresses basic 75 - 89% of the time/requires cueing 10 - 24% of the time. Needs helper to occlude trach/needs to repeat words.  Social Interaction Social Interaction assist level: Interacts appropriately 50 - 74% of the time - May be physically or verbally inappropriate.  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   -continue therapies   -reviewed prognosis re: vision/stroke recovery with patient today 2. DVT Prophylaxis/Anticoagulation: SCDs. Monitor for any signs of DVT 3. Pain Management:Tylenol as needed 4. Mood:Provide emotional support as possible. Discussed mood with patient this morning. Denies depression   -Suspect some coping issues.  Will  request neuropsychological evaluation this week   -added low dose lexapro at HS 5. Neuropsych: This patientiscapable of making decisions on hisown behalf. 6. Skin/Wound Care:Routine skin checks 7. Fluids/Electrolytes/Nutrition:encourage PO   -appetite fair.   8.Supraventricular tachycardia with history of CAD/PCI and noted findings of aortic stenosis. Current plan to follow up outpatient with cardiology services.  Began Eliquis 5 mg twice daily on 3/17 with decreased aspirin to 81 mg.  Stop Plavix.    9.Diet controlled diabetes mellitus. Hemoglobin A1c 4.8. Currently with SSI.   -sugars under tight control.    -Stopped CBG checks.  Diet changed to regular 10.History of urinary retention.  Intermittent urinary incontinence.    - ua/ucx reviewed and are negative 11.Hyperlipidemia. Crestor 12.Constipation. Laxative assistance   -Changed Senokot-S to 2 tablets scheduled at bedtime    -no bm since 3/18  LOS (Days) 6 A FACE TO FACE EVALUATION WAS PERFORMED  Ranelle Oyster, MD 02/26/2018 6:25 PM

## 2018-02-26 NOTE — Progress Notes (Signed)
Speech Language Pathology Daily Session Note  Patient Details  Name: Nicholas [name] FullingWillie J Belongia MRN: [id number] Date of Birth: 05/16/1951  Today's Date: 02/26/2018 SLP Individual Time: [id number] SLP Individual Time Calculation (min): 45 min  Short Term Goals: Week 1: SLP Short Term Goal 1 (Week 1): Pt will demonstrate intellectual awareness by identifying 2 physical and cognitive deficits with Mod A verbal cues.  SLP Short Term Goal 2 (Week 1): Pt will complete basic problem solving tasks with Mod A verbal cues.  SLP Short Term Goal 3 (Week 1): Pt will recall daily new information with utilization of external aids with Mod A verbal cues.  SLP Short Term Goal 4 (Week 1): Pt will demonstrate sustained attention for 10 minutes during functional tasks with Mod A verbal cues.  SLP Short Term Goal 5 (Week 1): Pt will attend to right field of environment during functional tasks with Mod A verbal cues.   Skilled Therapeutic Interventions: Skilled treatment session focused on cognitive goals. SLP facilitated session by providing Mod A verbal cues and extra time for problem solving during tray set-up and self-feeding. Pt demonstrated increased attention to right field of environment compared to yesterday's session; however, demonstrated decreased attention to left field of environment. SLP further facilitated session by providing Max A verbal cues for recall of date and previous therapy sessions with the utilization of external aids. Pt demonstrated sustained attention to breakfast for ~20 minutes with Min A verbal cues; however, pt can only attend to more structured tasks for approximately 5 minutes due to low frustration tolerance. Pt left supine in bed with alarm on and all needs within reach. Continue with current plan of care.      Function:  Cognition Comprehension Comprehension assist level: Understands basic 90% of the time/cues < 10% of the time  Expression   Expression assist level: Expresses basic 75  - 89% of the time/requires cueing 10 - 24% of the time. Needs helper to occlude trach/needs to repeat words.  Social Interaction Social Interaction assist level: Interacts appropriately 50 - 74% of the time - May be physically or verbally inappropriate.  Problem Solving Problem solving assist level: Solves basic 25 - 49% of the time - needs direction more than half the time to initiate, plan or complete simple activities  Memory Memory assist level: Recognizes or recalls 25 - 49% of the time/requires cueing 50 - 75% of the time    Pain Pain Assessment Pain Location: Buttocks Pain Intervention(s): RN made aware  Therapy/Group: Individual Therapy  Roxan HockeyYahui Janiah Devinney  SLP - Student 02/26/2018, 3:20 PM

## 2018-02-26 NOTE — Progress Notes (Signed)
Occupational Therapy Session Note  Patient Details  Name: Nicholas [name] MRN: [id number] Date of Birth: 01/26/1951  Today's Date: 02/26/2018 OT Individual Time: [id number] OT Individual Time Calculation (min): 57 min    Short Term Goals: Week 1:  OT Short Term Goal 1 (Week 1): Pt will complete toilet transfer with Min A and LRAD OT Short Term Goal 2 (Week 1): Pt will doff/don footwear with close supervision  OT Short Term Goal 3 (Week 1): Pt will scan to Rt for grooming items with mod multimodal cues   Skilled Therapeutic Interventions/Progress Updates:    Pt seen for OT ADL bathing/dressing session. Pt sitting EOB upon arrival eating lunch. While pt finished meal, therapist worked on Database administratoraddressing rapport with pt as pt refusing therapy initially and refused tx sessions earlier in day. He required min cuing and assist for set-up during self feeding activity as pt with difficulty scooping items onto utensil and perceptual deficits with border of plate. Pt agreeable to bathing at shower level this afternoon. He ambulated throughout room with min A, requiring max cuing and tactile cues for RW management and navigation within bathroom and cuing for larger steps as pt shuffling feet with ataxic movements. He bathed seated on tub bench, required VCs to progress with task as pt washing B LEs multiple times and not addressing UB. He was able to locate self care items in shower and manipulate small bottles with slightly increased time.  He returned to w/c to dress, assist for set-up of clothing orientation and steadying assist when standing at RW to pull pants up. Pt agreeable to stay sitting up in w/c at end of session, left seated with all needs in reach, QRB donned, and chair alarm activated.   Therapy Documentation Precautions:  Precautions Precautions: Fall(Simultaneous filing. User may not have seen previous data.) Precaution Comments: Rt visual field cut(Simultaneous filing. User may not have  seen previous data.) Restrictions Weight Bearing Restrictions: No Pain:   No/denies pain ADL: ADL ADL Comments: Please see functional navigator for ADL status  See Function Navigator for Current Functional Status.   Therapy/Group: Individual Therapy  Tienna Bienkowski L 02/26/2018, 3:23 PM

## 2018-02-26 NOTE — Progress Notes (Signed)
Error- monitor already ordered for 3/28  Nicholas ShelterLUKE Bulmaro Feagans PA-C 02/26/2018 9:28 AM

## 2018-02-26 NOTE — Progress Notes (Signed)
Physical Therapy Note  Patient Details  Name: Erin FullingWillie J Clardy MRN: [id number] Date of Birth: 07/12/1951 Today's Date: 02/26/2018    830-935 65 minutes  1:1 Pt c/o pain in buttocks, RN aware and pt given meds prior to session.  Pt provided with new w/c cushion due to stage 2 pressure wound.  Pt agreeable to get up in w/c.  Pt mod A for stand pivot transfer.  Pt performs w/c mobility with min A due to pt impulsive and with visual deficits.  Pt then upset and asks to go back to bed.  Pt then agreeable to attempt toileting. Pt min A toilet transfers, total A for clothing management to don pants and brief, min A to don shirt.  Pt then performs hand washing and teeth brushing at sink with mod auditory cues to locate items at sink.  Pt then states "I think I want to try some exercise" .  Pt performs gait 65' x 2, 100'.  With min A and RW.  Furniture transfer to low couch with min A to stand.  Pt left in bed with needs at hand, alarm on.   Sheila Gervasi 02/26/2018, 9:39 AM

## 2018-02-26 NOTE — Progress Notes (Signed)
Physical Therapy Note  Patient Details  Name: Nicholas [name] MRN: [id number] Date of Birth: 20-Jun-1951 Today's Date: 02/26/2018   No c/o pain, but unwilling to participate in therapy session.  PT provided education on importance of participating, but pt reports he just "is feeling everything."  When prompted by therapist for "sick?" or "pain?" pt just states "everything.  Just everything.  I thought if I closed my eyes you'd be gone but you're still here."  Pt left supine in bed with alarm activated and needs met.    Michel Santee 02/26/2018, 11:58 AM

## 2018-02-27 ENCOUNTER — Inpatient Hospital Stay (HOSPITAL_COMMUNITY): Payer: Medicare Other | Admitting: Speech Pathology

## 2018-02-27 ENCOUNTER — Inpatient Hospital Stay (HOSPITAL_COMMUNITY): Payer: Medicare Other

## 2018-02-27 ENCOUNTER — Inpatient Hospital Stay (HOSPITAL_COMMUNITY): Payer: Medicare Other | Admitting: Physical Therapy

## 2018-02-27 ENCOUNTER — Inpatient Hospital Stay (HOSPITAL_COMMUNITY): Payer: Medicare Other | Admitting: Occupational Therapy

## 2018-02-27 NOTE — Progress Notes (Signed)
Speech Language Pathology Daily Session Note  Patient Details  Name: Nicholas [name] MRN: [id number] Date of Birth: 01/09/1951  Today's Date: 02/27/2018 SLP Individual Time: 0700-0730 SLP Individual Time Calculation (min): 30 min  Short Term Goals: Week 1: SLP Short Term Goal 1 (Week 1): Pt will demonstrate intellectual awareness by identifying 2 physical and cognitive deficits with Mod A verbal cues.  SLP Short Term Goal 2 (Week 1): Pt will complete basic problem solving tasks with Mod A verbal cues.  SLP Short Term Goal 3 (Week 1): Pt will recall daily new information with utilization of external aids with Mod A verbal cues.  SLP Short Term Goal 4 (Week 1): Pt will demonstrate sustained attention for 10 minutes during functional tasks with Mod A verbal cues.  SLP Short Term Goal 5 (Week 1): Pt will attend to right field of environment during functional tasks with Mod A verbal cues.   Skilled Therapeutic Interventions: Skilled treatment session focused on cognition goals.l SLP received pt in bed and he was agreeable to working with SLP. SLP facilitated session by providing supervision cues for safe bed mobility for use of urinal. SLP also facilitated session by providing Min A cues to wear glasses for better success in setting up breakfast tray and Mod A cues to locate items to left of tray. Pt required Max A cues for intellectual awareness. Pt was left upright in bed, bed alarm on and all needs within reach.      Function:    Cognition Comprehension Comprehension assist level: Understands basic 90% of the time/cues < 10% of the time  Expression   Expression assist level: Expresses basic 90% of the time/requires cueing < 10% of the time.  Social Interaction Social Interaction assist level: Interacts appropriately 75 - 89% of the time - Needs redirection for appropriate language or to initiate interaction.  Problem Solving Problem solving assist level: Solves basic 25 - 49% of the time -  needs direction more than half the time to initiate, plan or complete simple activities;Solves basic 50 - 74% of the time/requires cueing 25 - 49% of the time  Memory Memory assist level: Recognizes or recalls 25 - 49% of the time/requires cueing 50 - 75% of the time    Pain    Therapy/Group: Individual Therapy  Amour Cutrone 02/27/2018, 8:55 AM

## 2018-02-27 NOTE — Progress Notes (Signed)
Occupational Therapy Session Note  Patient Details  Name: Nicholas [name] MRN: [id number] Date of Birth: 04/03/1951  Today's Date: 02/27/2018 OT Individual Time: 1001-1058 OT Individual Time Calculation (min): 57 min    Short Term Goals: Week 1:  OT Short Term Goal 1 (Week 1): Pt will complete toilet transfer with Min A and LRAD OT Short Term Goal 2 (Week 1): Pt will doff/don footwear with close supervision  OT Short Term Goal 3 (Week 1): Pt will scan to Rt for grooming items with mod multimodal cues   Skilled Therapeutic Interventions/Progress Updates:    Upon entering the room, pt transitioning from PT session with no c/o pain. Pt requesting to shower this session. OT insisting pt attempt toileting first. Pt ambulating 10' with RW to bathroom with min A and assistance with RW advancement. Pt seated on toilet and able to have BM this session. Pt removed clothing while seated on toilet for safety awareness with steady assistance. Pt standing and performing hygiene needs with overall steady assistance for balance. Pt transferred onto TTB for bathing with min A for sit <>stand to wash buttocks and peri area. Pt returning to sit in wheelchair to don clothing items. Pt seated in wheelchair with min multimodal cues to locate items on the R for grooming tasks at sink. Pt remained seated in wheelchair with quick release belt donned and chair alarm activated. Call bell within reach.   Therapy Documentation Precautions:  Precautions Precautions: Fall(Simultaneous filing. User may not have seen previous data.) Precaution Comments: Rt visual field cut(Simultaneous filing. User may not have seen previous data.) Restrictions Weight Bearing Restrictions: No   Pain: Pain Assessment Pain Score: 0-No pain ADL: ADL ADL Comments: Please see functional navigator for ADL status \ See Function Navigator for Current Functional Status.   Therapy/Group: Individual Therapy  Nicholas [name], Nicholas [name]  P 02/27/2018, 12:46 PM

## 2018-02-27 NOTE — Progress Notes (Signed)
[place] PHYSICAL MEDICINE & REHABILITATION     PROGRESS NOTE    Subjective/Complaints: Just finished breakfast. Says he slept better.   ROS: Patient denies fever, rash, sore throat, blurred vision, nausea, vomiting, diarrhea, cough, shortness of breath or chest pain, joint or back pain, headache, or mood change.   Objective: Vital Signs: Blood pressure (!) 137/55, pulse 71, temperature 98.1 F (36.7 C), temperature source Oral, resp. rate 16, height 5\' 11"  (1.803 m), weight 75 kg (165 lb 5.5 oz), SpO2 99 %. No results found. No results for input(s): WBC, HGB, HCT, PLT in the last 72 hours. No results for input(s): NA, K, CL, GLUCOSE, BUN, CREATININE, CALCIUM in the last 72 hours.  Invalid input(s): CO CBG (last 3)  Recent Labs    02/24/18 1642 02/24/18 2053 02/25/18 0636  GLUCAP 92 132* 74    Wt Readings from Last 3 Encounters:  02/26/18 75 kg (165 lb 5.5 oz)  02/18/18 78.3 kg (172 lb 9.9 oz)  10/17/17 85.5 kg (188 lb 7.9 oz)    Physical Exam:  Constitutional: No distress . Vital signs reviewed. HEENT: EOMI, oral membranes moist Cardiovascular: RRR with ES murmur. No JVD    Respiratory: CTA Bilaterally without wheezes or rales. Normal effort    GI: BS +, non-tender, non-distended  Skin. Patient with large scar right side of neck  Musculoskeletal: No edema or tenderness in extremities Neurological: He isalert. Delayed processing. Impaired insight and awareness dense right homonymous hemianopsia is persistent Motor: 4+/5 grossly throughoutexcept 0/5left ankle dorsiflexors-no changes Senses LT and pain in all 4's.  Psych: Flat but cooperative. Perhaps a little more interactive today   Assessment/Plan: 1. Decreased functional mobility and visual-spatial deficits secondary to left temporal-occipital infarct which require 3+ hours per day of interdisciplinary therapy in a comprehensive inpatient rehab setting. Physiatrist is providing close team supervision and  24 hour management of active medical problems listed below. Physiatrist and rehab team continue to assess barriers to discharge/monitor patient progress toward functional and medical goals.  Function:  Bathing Bathing position Bathing activity did not occur: N/A Position: Shower  Bathing parts Body parts bathed by patient: Right arm, Left arm, Chest, Abdomen, Front perineal area, Right upper leg, Left upper leg, Right lower leg, Left lower leg Body parts bathed by helper: Buttocks, Back  Bathing assist Assist Level: Touching or steadying assistance(Pt > 75%)      Upper Body Dressing/Undressing Upper body dressing   What is the patient wearing?: Pull over shirt/dress     Pull over shirt/dress - Perfomed by patient: Thread/unthread right sleeve, Thread/unthread left sleeve, Put head through opening, Pull shirt over trunk          Upper body assist Assist Level: Supervision or verbal cues, Set up   Set up : To obtain clothing/put away  Lower Body Dressing/Undressing Lower body dressing   What is the patient wearing?: Pants, Non-skid slipper socks Underwear - Performed by patient: Thread/unthread right underwear leg, Thread/unthread left underwear leg, Pull underwear up/down   Pants- Performed by patient: Thread/unthread right pants leg, Thread/unthread left pants leg, Pull pants up/down   Non-skid slipper socks- Performed by patient: Don/doff right sock, Don/doff left sock Non-skid slipper socks- Performed by helper: Don/doff right sock, Don/doff left sock                  Lower body assist Assist for lower body dressing: Touching or steadying assistance (Pt > 75%)      Toileting Toileting Toileting activity  did not occur: No continent bowel/bladder event Toileting steps completed by patient: Adjust clothing prior to toileting, Performs perineal hygiene, Adjust clothing after toileting Toileting steps completed by helper: Adjust clothing prior to toileting, Adjust  clothing after toileting Toileting Assistive Devices: Grab bar or rail  Toileting assist Assist level: Touching or steadying assistance (Pt.75%)   Transfers Chair/bed transfer   Chair/bed transfer method: Stand pivot Chair/bed transfer assist level: Touching or steadying assistance (Pt > 75%) Chair/bed transfer assistive device: Armrests     Locomotion Ambulation     Max distance: 10' Assist level: Touching or steadying assistance (Pt > 75%)   Wheelchair   Type: Manual Max wheelchair distance: 5320ft Assist Level: Touching or steadying assistance (Pt > 75%)  Cognition Comprehension Comprehension assist level: Understands basic 90% of the time/cues < 10% of the time  Expression Expression assist level: Expresses basic 75 - 89% of the time/requires cueing 10 - 24% of the time. Needs helper to occlude trach/needs to repeat words.  Social Interaction Social Interaction assist level: Interacts appropriately 50 - 74% of the time - May be physically or verbally inappropriate.  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   -continue therapies   -PT, OT, SLP 2. DVT Prophylaxis/Anticoagulation: SCDs. Monitor for any signs of DVT 3. Pain Management:Tylenol as needed 4. Mood:Provide emotional support as possible. Discussed mood with patient this morning. Denies depression   -Suspect some coping issues.  Will request neuropsychological evaluation as avaailbe   -added  low dose lexapro at HS 5mg  5. Neuropsych: This patientiscapable of making decisions on hisown behalf. 6. Skin/Wound Care:Routine skin checks 7. Fluids/Electrolytes/Nutrition:encourage PO   -appetite fair.   8.Supraventricular tachycardia with history of CAD/PCI and noted findings of aortic stenosis. Current plan to follow up outpatient with cardiology services.  Began Eliquis 5 mg twice daily on 3/17 with decreased aspirin to 81 mg.  Stop Plavix.    9.Diet controlled diabetes mellitus. Hemoglobin A1c 4.8. Currently with SSI.   -sugars under tight control.    -Stopped CBG checks.  Diet changed to regular 10.History of urinary retention.  Intermittent urinary incontinence.    - ua/ucx reviewed and are negative 11.Hyperlipidemia. Crestor 12.Constipation. Laxative assistance   -Changed Senokot-S to 2 tablets scheduled at bedtime    -no bm since 3/18--needs one today  LOS (Days) 7 A FACE TO FACE EVALUATION WAS PERFORMED  Ranelle OysterZachary T Swartz, MD 02/27/2018 8:46 AM

## 2018-02-27 NOTE — Progress Notes (Signed)
Physical Therapy Session Note  Patient Details  Name: Nicholas [name] MRN: [id number] Date of Birth: 01/28/1951  Today's Date: 02/27/2018 PT Individual Time: 1445-1515 PT Individual Time Calculation (min): 30 min   Short Term Goals: Week 1:  PT Short Term Goal 1 (Week 1): pt will consistently perform functional transfers with min A PT Short Term Goal 2 (Week 1): pt will perform gait x 50' in controlled environment with min A  Skilled Therapeutic Interventions/Progress Updates:    Pt seated in w/c in room, agreeable to participate in therapy session. Pt reports 5/10 pain in L side, is not more specific than that. Sit to stand with min assist to RW. Ambulation 2 x 150 ft with RW and min assist, multimodal cues for balance, safe use of RW, and navigation of obstacles in environment. Pt exhibits Parkinson-like gait pattern with small, short steps. Pt is able to increase step length with cues for "bigger step". Pt also requires cues to keep RW closer to his body during gait for improved safety and balance. Standing balance activities: side-steps with rail in hallway and min assist for balance, cues to slow down and increase step length; Romberg stance and modified tandem stance with no UE support and mod assist for balance. Pt left seated in w/c in room with needs in reach, quick release belt in place, chair alarm in place.  Therapy Documentation Precautions:  Precautions Precautions: Fall(Simultaneous filing. User may not have seen previous data.) Precaution Comments: Rt visual field cut(Simultaneous filing. User may not have seen previous data.) Restrictions Weight Bearing Restrictions: No  See Function Navigator for Current Functional Status.   Therapy/Group: Individual Therapy  [name], PT, DPT  02/27/2018, 3:23 PM

## 2018-02-27 NOTE — Progress Notes (Signed)
Social Work Patient ID: Nicholas [name], male   DOB: [birth year], 67 y.o.   MRN: [id number]   Met with pt yesterday and today to discuss team conference and have spoken with his daughter today.  Yesterday, pt was very distraught when I entered and upset that he had  urinated in the bed and kept repeating, "This is terrible.  I can't believe this is happening!  Just give me a gun 'cause I can't keep living like this."  Attempted to calm and console, however, pt was so internally distracted by his frustration that I was unable to calm.   Today, pt much calmer with me and we talked about how upset he was yesterday.  Pt was participating better in therapies today as well.  Pt was informed that team feels he will need to have 24/7 at his daughter's home. Spoke with daughter who reports that she does not feel she and her spouse could manage patient's care needs in their home and that their home was only a short -term option anyway.  She states, "his memory is the problem...that's why he gets so upset."  Daughter states that she would like for me to pursue SNF for her father and that she will discuss with him this evening.  I plan to follow up with pt tomorrow to discuss change in plan.  Alerting tx team and MD.  Lennart Pall, LCSW

## 2018-02-27 NOTE — Progress Notes (Signed)
Physical Therapy Note  Patient Details  Name: Nicholas [name] MRN: [id number] Date of Birth: 02/16/1951 Today's Date: 02/27/2018   This note composed with input from [facility] McFaddin, SPT  Tx 1: 0920-1001, 41 min individual tx Pain: reports pain over sacrum due to unhealed sore attended to by RN, Misty StanleyStacey  Pt received lying with head of bed elevated, positioned to the far R side of bed.  Pt could not recall the day of week, date, season, or year. Perseverated on having worked with PT previously (had not). However had remembered where w/c brakes were by end of session.  Visual alterations noted. Pt required verbal cueing, guiding, and increased time to be able to name room number outside of room with head turned to L.  Stand<>pivot transfers with min A for guarding, verbal and tactile cueing and visual assistance.  NMR via NuStep 5' lvl 5. Pt keeps eyes closed, and has R trunk lean. Pt required multimodal cueing to fully extend legs and keep L foot on pedal. Able to loosen handles with guided assistance to the knob.  Gait training x 180' with RW, R and L turns, and orientation of middle of hallway. Contact guard and multimodal cueing for visual orientation and facilitation of usage of R visual field. Pt showing Parkinson-like stuttering steps upon initiation of ambulation and with turns to the R/L. Short shuffle gait and slight R trunk lean noted throughout gait.  Pt left in w/c with attendance of OT, Katie for tx.  Tx 2: 1300-1345, 45 min individual tx Pain: non per pt  Pt received asleep in bed, but easily aroused. Pt initially did not want to do anything, but upon sitting EOB was agreeable to PT.  Pt stated "I'm coming down with a cold", Misty StanleyStacey, RN notified.  Neuromuscular reeducation included static standing while doing pegboard activities x 20'. PT held pegs eye level to the R side of pt to facilitate use of R visual field. Color identification (blue/red), clinical reasoning  (which color comes next), object manipulation (sticking pegs in hole), motor control of RUE, and depth perception were challenged. Pt often under/over reached and identified correct color < 25% of time. Pt required multimodal cueing to identify color and place pegs in correct spot towards R side of pegboard.   Gait training in home environment with RW x 40' (over 20 min) and min A requiring multimodal cueing to scan R visual field and critically reason. Pt operated door, crossed thresholds, carpet, tile, washed/dried hands. At end of session pt spontaneously scanned to the R when given time and objects were not immediately observed.  Pt left in w/c with quick release belt, chair alarm, and all needs at hand.  See function navigator for current status. Shanel Prazak 02/27/2018, 7:50 AM

## 2018-02-28 ENCOUNTER — Inpatient Hospital Stay (HOSPITAL_COMMUNITY): Payer: Medicare Other | Admitting: Physical Therapy

## 2018-02-28 ENCOUNTER — Inpatient Hospital Stay (HOSPITAL_COMMUNITY): Payer: Medicare Other | Admitting: Occupational Therapy

## 2018-02-28 ENCOUNTER — Inpatient Hospital Stay (HOSPITAL_COMMUNITY): Payer: Medicare Other | Admitting: Speech Pathology

## 2018-02-28 MED ORDER — ESCITALOPRAM OXALATE 10 MG PO TABS
10.0000 mg | ORAL_TABLET | Freq: Every day | ORAL | Status: DC
  Administered 2018-02-28 – 2018-03-06 (×7): 10 mg via ORAL
  Filled 2018-02-28 (×7): qty 1

## 2018-02-28 NOTE — Progress Notes (Signed)
Occupational Therapy Session Note  Patient Details  Name: Nicholas [name] MRN: [id number] Date of Birth: 09/15/1951  Today's Date: 02/28/2018 OT Individual Time: [id number] OT Individual Time Calculation (min): 60 min   Short Term Goals: Week 1:  OT Short Term Goal 1 (Week 1): Pt will complete toilet transfer with Min A and LRAD OT Short Term Goal 2 (Week 1): Pt will doff/don footwear with close supervision  OT Short Term Goal 3 (Week 1): Pt will scan to Rt for grooming items with mod multimodal cues   Skilled Therapeutic Interventions/Progress Updates:    Pt greeted supine in bed. He c/o pain in ribs/back, fatigue, and frustrated by perceived lack of therapy progress. Provided pt with therapeutic listening, emotional support, and tried to address stated concerns. Pt particularly perseverating on never feeling comfortable in bed. Tx focus on bed positioning to maximize comfort and restfulness, as well as managing pain. Worked on positioning pt with props in side-lying positions Rt and Lt with pt reporting significant improvement in comfort. Pt scooting to Lt and Rt aspects of bed and boosting himself up throughout tx with instruction. Also supplied him with MHP for back while in sidelying with reporting improvement in back pain. RN made aware and recommended for order of k pad in room. At end of session pt verbalizing need to use restroom, and ambulated with RW and steady assist with cues for "big steps," avoiding obstacles on Rt, and DME safety. After voiding bladder, pt completed stand pivot<w/c. Agreeable to remain up in w/c for lunch. Pt left with safety belt fastened and all needs within reach.    Therapy Documentation Precautions:  Precautions Precautions: Fall(Simultaneous filing. User may not have seen previous data.) Precaution Comments: Rt visual field cut(Simultaneous filing. User may not have seen previous data.) Restrictions Weight Bearing Restrictions: No Pain: Stated/addressed  above   ADL: ADL ADL Comments: Please see functional navigator for ADL status    See Function Navigator for Current Functional Status.   Therapy/Group: Individual Therapy  Karrin Eisenmenger A Lorelei Heikkila 02/28/2018, 12:46 PM

## 2018-02-28 NOTE — Progress Notes (Signed)
Physical Therapy Note  Patient Details  Name: Nicholas [name] MRN: [id number] Date of Birth: 07/17/1951 Today's Date: 02/28/2018    Time: 830-925 55 minutes  1:1 Pt agreeable to therapy with minimal encouragement today.  Pt performs gait throughout unit with close supervision, occasional min A with RW with assist for avoiding obstacles due to low vision, pt improving ability to follow instructions for Rt/Lt turns.  Step ups with alternating LEs to 4'' step for strengthening 2 x 10.  Standing clothespin task with focus on Lt UE manipulation and visual scanning with min cues.  Stair negotiation x 4 stairs with bilat handrails with min A.  nustep x 5 minutes with pt able to keep Lt LE on pedal without assist.  PT encouraged pt about to improvement, pt states he feels "a little better".  Pt left in bed with alarm on, needs at hand.   Keary Waterson 02/28/2018, 9:25 AM

## 2018-02-28 NOTE — Progress Notes (Signed)
[place] PHYSICAL MEDICINE & REHABILITATION     PROGRESS NOTE    Subjective/Complaints: No new issues. Has good appetite. Still down about his perceived lack of progress and poor progrnosis  ROS: Patient denies fever, rash, sore throat, blurred vision, nausea, vomiting, diarrhea, cough, shortness of breath or chest pain, joint or back pain, headache.   Objective: Vital Signs: Blood pressure (!) 137/57, pulse 74, temperature 97.8 F (36.6 C), temperature source Oral, resp. rate 17, height 5\' 11"  (1.803 m), weight 75 kg (165 lb 5.5 oz), SpO2 99 %. No results found. No results for input(s): WBC, HGB, HCT, PLT in the last 72 hours. No results for input(s): NA, K, CL, GLUCOSE, BUN, CREATININE, CALCIUM in the last 72 hours.  Invalid input(s): CO CBG (last 3)  No results for input(s): GLUCAP in the last 72 hours.  Wt Readings from Last 3 Encounters:  02/26/18 75 kg (165 lb 5.5 oz)  02/18/18 78.3 kg (172 lb 9.9 oz)  10/17/17 85.5 kg (188 lb 7.9 oz)    Physical Exam:  Constitutional: No distress . Vital signs reviewed. HEENT: EOMI, oral membranes moist Cardiovascular: RRR without murmur. No JVD    Respiratory: CTA Bilaterally without wheezes or rales. Normal effort    GI: BS +, non-tender, non-distended  Skin. Patient with large scar right side of neck  Musculoskeletal: No edema or tenderness in extremities Neurological: He isalert. Delayed processing. Impaired insight and awareness dense right homonymous hemianopsia is unchanged Motor: 4+/5 grossly throughoutexcept 0/5left ankle dorsiflexors-stable exam Senses LT and pain in all 4's.  Psych: flat, did crack a smile today   Assessment/Plan: 1. Decreased functional mobility and visual-spatial deficits secondary to left temporal-occipital infarct which require 3+ hours per day of interdisciplinary therapy in a comprehensive inpatient rehab setting. Physiatrist is providing close team supervision and 24 hour management of  active medical problems listed below. Physiatrist and rehab team continue to assess barriers to discharge/monitor patient progress toward functional and medical goals.  Function:  Bathing Bathing position Bathing activity did not occur: N/A Position: Shower  Bathing parts Body parts bathed by patient: Right arm, Left arm, Chest, Abdomen, Front perineal area, Right upper leg, Left upper leg, Right lower leg, Left lower leg, Buttocks Body parts bathed by helper: Buttocks, Back  Bathing assist Assist Level: Touching or steadying assistance(Pt > 75%)      Upper Body Dressing/Undressing Upper body dressing   What is the patient wearing?: Pull over shirt/dress     Pull over shirt/dress - Perfomed by patient: Thread/unthread right sleeve, Thread/unthread left sleeve, Put head through opening, Pull shirt over trunk          Upper body assist Assist Level: Supervision or verbal cues, Set up   Set up : To obtain clothing/put away  Lower Body Dressing/Undressing Lower body dressing   What is the patient wearing?: Pants, Non-skid slipper socks Underwear - Performed by patient: Thread/unthread right underwear leg, Thread/unthread left underwear leg, Pull underwear up/down   Pants- Performed by patient: Thread/unthread right pants leg, Thread/unthread left pants leg, Pull pants up/down   Non-skid slipper socks- Performed by patient: Don/doff right sock, Don/doff left sock Non-skid slipper socks- Performed by helper: Don/doff right sock, Don/doff left sock                  Lower body assist Assist for lower body dressing: Touching or steadying assistance (Pt > 75%)      Toileting Toileting Toileting activity did not occur: No continent  bowel/bladder event Toileting steps completed by patient: Adjust clothing prior to toileting, Performs perineal hygiene, Adjust clothing after toileting Toileting steps completed by helper: Adjust clothing prior to toileting, Adjust clothing after  toileting Toileting Assistive Devices: Grab bar or rail  Toileting assist Assist level: Touching or steadying assistance (Pt.75%)   Transfers Chair/bed transfer   Chair/bed transfer method: Ambulatory Chair/bed transfer assist level: Touching or steadying assistance (Pt > 75%) Chair/bed transfer assistive device: Armrests, Patent attorney     Max distance: 40 Assist level: Touching or steadying assistance (Pt > 75%)   Wheelchair   Type: Manual Max wheelchair distance: 57ft Assist Level: Touching or steadying assistance (Pt > 75%)  Cognition Comprehension Comprehension assist level: Understands basic 90% of the time/cues < 10% of the time  Expression Expression assist level: Expresses basic 90% of the time/requires cueing < 10% of the time.  Social Interaction Social Interaction assist level: Interacts appropriately 75 - 89% of the time - Needs redirection for appropriate language or to initiate interaction.  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   -continue PT, OT, SLP 2. DVT Prophylaxis/Anticoagulation: SCDs. Monitor for any signs of DVT 3. Pain Management:Tylenol as needed 4. Mood:team providing emotional support as possible. Ongoing depression   -increase lexapro to 10mg  qhs 5. Neuropsych: This patientiscapable of making decisions on hisown behalf. 6. Skin/Wound Care:Routine skin checks 7.  Fluids/Electrolytes/Nutrition:encourage PO   -appetite fair.   8.Supraventricular tachycardia with history of CAD/PCI and noted findings of aortic stenosis. Current plan to follow up outpatient with cardiology services.  Began Eliquis 5 mg twice daily on 3/17 with decreased aspirin to 81 mg.  Now off plavix.    9.Diet controlled diabetes mellitus. Hemoglobin A1c 4.8. Currently with SSI.   -sugars under tight control.    -Stopped CBG checks.  Diet changed to regular 10.History of urinary retention.  Intermittent urinary incontinence.    - ua/ucx reviewed and are negative 11.Hyperlipidemia. Crestor 12.Constipation. Laxative assistance   -Changed Senokot-S to 2 tablets scheduled at bedtime    -+BM 3/21   LOS (Days) 8 A FACE TO FACE EVALUATION WAS PERFORMED  Ranelle Oyster, MD 02/28/2018 9:49 AM

## 2018-02-28 NOTE — Progress Notes (Signed)
Speech Language Pathology Weekly Progress and Session Note  Patient Details  Name: Nicholas [name] MRN: [id number] Date of Birth: 03-19-1951  Beginning of progress report period:  February 21, 2018 End of progress report period:  February 28, 2018  Today's Date: 02/28/2018 SLP Individual Time: 1349-1410 SLP Individual Time Calculation (min): 21 min  Short Term Goals: Week 1: SLP Short Term Goal 1 (Week 1): Pt will demonstrate intellectual awareness by identifying 2 physical and cognitive deficits with Mod A verbal cues.  SLP Short Term Goal 1 - Progress (Week 1): Met SLP Short Term Goal 2 (Week 1): Pt will complete basic problem solving tasks with Mod A verbal cues.  SLP Short Term Goal 2 - Progress (Week 1): Met SLP Short Term Goal 3 (Week 1): Pt will recall daily new information with utilization of external aids with Mod A verbal cues.  SLP Short Term Goal 3 - Progress (Week 1): Progressing toward goal SLP Short Term Goal 4 (Week 1): Pt will demonstrate sustained attention for 10 minutes during functional tasks with Mod A verbal cues.  SLP Short Term Goal 4 - Progress (Week 1): Met SLP Short Term Goal 5 (Week 1): Pt will attend to right field of environment during functional tasks with Mod A verbal cues.  SLP Short Term Goal 5 - Progress (Week 1): Met    New Short Term Goals: Week 2: SLP Short Term Goal 1 (Week 2): Pt will demonstrate intellectual awareness by identifying 2 physical and cognitive deficits with Min A verbal cues.  SLP Short Term Goal 2 (Week 2): Pt will complete basic problem solving tasks with Min A verbal cues.  SLP Short Term Goal 3 (Week 2): Pt will recall daily new information with utilization of external aids with Mod A verbal cues.  SLP Short Term Goal 4 (Week 2): Pt will demonstrate sustained attention for 30 minutes during functional tasks with Min A verbal cues.  SLP Short Term Goal 5 (Week 2): Pt will attend to right field of environment during functional tasks  with Min A verbal cues.   Weekly Progress Updates:   Pt has made limited functional gains this reporting period and has met 4 out of 5 short term goals.  Pt is currently mod-max assist for tasks due to moderately severe cognitive deficits and limited participation in therapy.  Pt has demonstrated improved visual scanning and attention to tasks.  Pt education is ongoing.  Pt would continue to benefit from skilled ST while inpatient in order to maximize functional independence and reduce burden of care prior to discharge.  Anticipate that pt will need 24/7 supervision at discharge in addition to [place] follow at next level of care.        Intensity: Minumum of 1-2 x/day, 30 to 90 minutes Frequency: 3 to 5 out of 7 days Duration/Length of Stay: 14-16 days Treatment/Interventions: Cognitive remediation/compensation;Multimodal communication approach;Cueing hierarchy;Functional tasks;Therapeutic Activities;Internal/external aids;Patient/family education   Daily Session  Skilled Therapeutic Interventions: Pt was seen for skilled ST targeting cognitive goals.  Pt was awake and alert upon arrival.  He verbalized frustration at his perceived lack of progress in therapies.  SLP provided support and encouragement by providing examples of how pt has improved.  Attempted to redirect pt to more productive conversations regarding goals of therapy but pt persisted with negative self talk.  Offered to take pt out of room to a more sunny and less clinical environment; however, pt declined.  Pt was left in wheelchair with quick  release belt donned and call bell within reach.  Continue per current plan of care.         Function:   Eating Eating                 Cognition Comprehension Comprehension assist level: Understands basic 90% of the time/cues < 10% of the time  Expression   Expression assist level: Expresses basic 90% of the time/requires cueing < 10% of the time.  Social Interaction Social  Interaction assist level: Interacts appropriately 50 - 74% of the time - May be physically or verbally inappropriate.  Problem Solving Problem solving assist level: Solves basic 50 - 74% of the time/requires cueing 25 - 49% of the time  Memory Memory assist level: Recognizes or recalls 25 - 49% of the time/requires cueing 50 - 75% of the time   General    Pain Pain Assessment Pain Scale: 0-10 Pain Score: 0-No pain  Therapy/Group: Individual Therapy  [place] Haydon, Selinda Orion 02/28/2018, 3:24 PM

## 2018-02-28 NOTE — Progress Notes (Signed)
Physical Therapy Weekly Progress Note  Patient Details  Name: Nicholas [name] MRN: [id number] Date of Birth: 27-Dec-1950  Beginning of progress report period: February 21, 2018 End of progress report period: February 28, 2018  Patient has met 2 of 2 short term goals.  Pt improving activity tolerance and participation with therapy.  Pt is min A with mobility mostly due to visual deficits requiring physical and auditory cuing vs visual cues for path finding and functional tasks.  Patient continues to demonstrate the following deficits muscle weakness, decreased visual acuity and decreased visual perceptual skills, decreased awareness, decreased problem solving, decreased safety awareness, decreased memory and delayed processing and decreased standing balance and decreased balance strategies and therefore will continue to benefit from skilled PT intervention to increase functional independence with mobility.  Patient progressing toward long term goals..  Continue plan of care.  PT Short Term Goals Week 1:  PT Short Term Goal 1 (Week 1): pt will consistently perform functional transfers with min A PT Short Term Goal 1 - Progress (Week 1): Met PT Short Term Goal 2 (Week 1): pt will perform gait x 50' in controlled environment with min A PT Short Term Goal 2 - Progress (Week 1): Met Week 2:  PT Short Term Goal 1 (Week 2): = LTG  Skilled Therapeutic Interventions/Progress Updates:  Ambulation/gait training;Functional mobility training;Discharge planning;Therapeutic Activities;Wheelchair propulsion/positioning;Therapeutic Exercise;Neuromuscular re-education;Balance/vestibular training;Cognitive remediation/compensation;DME/adaptive equipment instruction;Pain management;Splinting/orthotics;UE/LE Strength taining/ROM;UE/LE Coordination activities;Stair training;Patient/family education;Community reintegration     See Function Navigator for Current Functional Status.  DONAWERTH,KAREN 02/28/2018, 8:23 AM

## 2018-02-28 NOTE — Progress Notes (Signed)
Occupational Therapy Session Note  Patient Details  Name: Nicholas [name] FullingWillie J Stingley MRN: [id number] Date of Birth: 01/20/1951  Today's Date: 02/28/2018 OT Individual Time: 1446-1530 OT Individual Time Calculation (min): 44 min   Short Term Goals: Week 1:  OT Short Term Goal 1 (Week 1): Pt will complete toilet transfer with Min A and LRAD OT Short Term Goal 2 (Week 1): Pt will doff/don footwear with close supervision  OT Short Term Goal 3 (Week 1): Pt will scan to Rt for grooming items with mod multimodal cues   Skilled Therapeutic Interventions/Progress Updates:    Pt greeted in w/c, no c/o pain and agreeable to tx. "I'll do whatever you want to do." Worked on Rt scanning and balance while pt completed grooming tasks standing at sink. Pt becoming increasingly agitated when encouraged to scan for items. He reports his vision will never get better and neither will his body so he didn't see the point in therapy. "If you don't tell me where it is, I'll sit and shut down." Pt then sat down in w/c and stopped talking with therapist. With extra time, redirected pt to donning clean shirt. He did so with supervision assist. OT applied lotion to feet and noted pitting edema. Consulted RN who consented use of Teds. While OT applied Teds, educated pt on importance of diabetes mgt/daily foot care with verbalized understanding. He donned gripper socks utilizing figure 4 position. Pt throughout session verbalizing how hopeless he felt about his present situation. Dismissive of education given by OT regarding his rehab potential and therapy goals. Provided emotional support and encouragement throughout session. At end of tx pt was left in w/c, repositioned for comfort with safety belt fastened, and all needs within reach.    Therapy Documentation Precautions:  Precautions Precautions: Fall(Simultaneous filing. User may not have seen previous data.) Precaution Comments: Rt visual field cut(Simultaneous filing. User may  not have seen previous data.) Restrictions Weight Bearing Restrictions: No Vital Signs: Therapy Vitals Temp: 98.3 F (36.8 C) Temp Source: Oral Pulse Rate: 73 Resp: 16 BP: (!) 122/52 Patient Position (if appropriate): Lying Oxygen Therapy SpO2: 98 % O2 Device: Room Air Pain: Pain Assessment Pain Scale: 0-10 Pain Score: 0-No pain ADL: ADL ADL Comments: Please see functional navigator for ADL status     See Function Navigator for Current Functional Status.   Therapy/Group: Individual Therapy  Bijan Ridgley A Omaree Fuqua 02/28/2018, 4:10 PM

## 2018-03-01 ENCOUNTER — Inpatient Hospital Stay (HOSPITAL_COMMUNITY): Payer: Medicare Other | Admitting: Occupational Therapy

## 2018-03-01 LAB — GLUCOSE, CAPILLARY: Glucose-Capillary: 65 mg/dL (ref 65–99)

## 2018-03-01 NOTE — Progress Notes (Signed)
Occupational Therapy Session Note  Patient Details  Name: Nicholas [name] MRN: [id number] Date of Birth: 07/01/1951  Today's Date: 03/01/2018 OT Individual Time: 1415-1500 OT Individual Time Calculation (min): 45 min    Short Term Goals: Week 1:  OT Short Term Goal 1 (Week 1): Pt will complete toilet transfer with Min A and LRAD OT Short Term Goal 2 (Week 1): Pt will doff/don footwear with close supervision  OT Short Term Goal 3 (Week 1): Pt will scan to Rt for grooming items with mod multimodal cues      Skilled Therapeutic Interventions/Progress Updates:  Pt seen this session to focus on standing balance, wt shifts, stepping patterns to improve ambulation.  Pt participated well in therapy.  He reported that his vision is very blurry "I feel like I am blind" and this is making the visual scanning activities challenging for him.  Pt was able to fully scan to his right side to see his wc on his R side.  Overall S with bed mobility, steadying A sit to stand and standing with RW.  Pt resting in w/c with quick release belt on and chair alarm.  His dtr was present for the session. Discussed visual adaptation strategies.     Therapy Documentation Precautions:  Precautions Precautions: Fall(Simultaneous filing. User may not have seen previous data.) Precaution Comments: Rt visual field cut(Simultaneous filing. User may not have seen previous data.) Restrictions Weight Bearing Restrictions: No  Pain:   ADL: ADL ADL Comments: Please see functional navigator for ADL status   See Function Navigator for Current Functional Status.   Therapy/Group: Individual Therapy  SAGUIER,JULIA 03/01/2018, 12:12 PM

## 2018-03-01 NOTE — Progress Notes (Signed)
Erin FullingWillie J Barrick is a 67 y.o. male 08/11/1951 [id number]  Subjective: No new complaints. No new problems.   Objective: Vital signs in last 24 hours: Temp:  [98.3 F (36.8 C)-98.7 F (37.1 C)] 98.7 F (37.1 C) (03/23 0435) Pulse Rate:  [73-76] 76 (03/23 0435) Resp:  [16-18] 18 (03/23 0435) BP: (122-127)/(44-52) 127/44 (03/23 0435) SpO2:  [97 %-98 %] 97 % (03/23 0435) Weight change:  Last BM Date: 02/27/18  Intake/Output from previous day: 03/22 0701 - 03/23 0700 In: 480 [P.O.:480] Out: 550 [Urine:550] Last cbgs: CBG (last 3)  Recent Labs    03/01/18 0733  GLUCAP 65     Physical Exam General: No apparent distress   HEENT: not dry Lungs: Normal effort. Lungs clear to auscultation, no crackles or wheezes. Cardiovascular: Regular rate and rhythm, no edema Abdomen: S/NT/ND; BS(+) Musculoskeletal:  unchanged Neurological: No new neurological deficits Wounds: N/A    Skin: clear  Aging changes Mental state: Asleep    Lab Results: BMET    Component Value Date/Time   NA 141 02/21/2018 0624   K 4.1 02/21/2018 0624   CL 104 02/21/2018 0624   CO2 30 02/21/2018 0624   GLUCOSE 77 02/21/2018 0624   BUN 15 02/21/2018 0624   CREATININE 1.05 02/21/2018 0624   CALCIUM 9.0 02/21/2018 0624   GFRNONAA >60 02/21/2018 0624   GFRAA >60 02/21/2018 0624   CBC    Component Value Date/Time   WBC 9.5 02/21/2018 0624   RBC 3.39 (L) 02/21/2018 0624   HGB 11.6 (L) 02/21/2018 0624   HCT 33.9 (L) 02/21/2018 0624   PLT 273 02/21/2018 0624   MCV 100.0 02/21/2018 0624   MCH 34.2 (H) 02/21/2018 0624   MCHC 34.2 02/21/2018 0624   RDW 12.4 02/21/2018 0624   LYMPHSABS 1.9 02/21/2018 0624   MONOABS 0.9 02/21/2018 0624   EOSABS 0.3 02/21/2018 0624   BASOSABS 0.0 02/21/2018 0624    Studies/Results: No results found.  Medications: I have reviewed the patient's current medications.  Assessment/Plan:  1.  Acute large nonhemorrhagic temporal occipital lobe PCA territory CVA with  additional small acute posterior circulation and posterior watershed infarcts. CIR 2.  DVT prophylaxis with SCD.  On Eliquis 3.  Pain management with Tylenol as needed 4.  Depression.  Continue with Lexapro 5.  Supraventricular tachycardia with a history of coronary artery disease/PCI and with a history of aortic stenosis.  Eliquis 5 mg twice a day.  Aspirin 81 mg a day 6.  Diabetes mellitus type 2.  On diet.  Sliding scale if needed 7.  History of urinary retention 8.  Hyperlipidemia on Crestor 9.  Constipation on laxative assistance          Length of stay, days: 9  Sonda PrimesAlex Sahian Kerney , MD 03/01/2018, 2:36 PM

## 2018-03-02 ENCOUNTER — Inpatient Hospital Stay (HOSPITAL_COMMUNITY): Payer: Medicare Other | Admitting: Occupational Therapy

## 2018-03-02 NOTE — Progress Notes (Signed)
Nicholas [name] is a 67 y.o. male 01/12/1951 [id number]  Subjective: No new complaints. No new problems. Slept well. Feeling OK.  Objective: Vital signs in last 24 hours: Temp:  [98.3 F (36.8 C)-98.4 F (36.9 C)] 98.4 F (36.9 C) (03/24 0314) Pulse Rate:  [61-73] 73 (03/24 0314) Resp:  [17-18] 17 (03/24 0314) BP: (139-146)/(44-51) 146/51 (03/24 0314) SpO2:  [99 %] 99 % (03/24 0314) Weight change:  Last BM Date: 02/27/18  Intake/Output from previous day: 03/23 0701 - 03/24 0700 In: 960 [P.O.:960] Out: 300 [Urine:300] Last cbgs: CBG (last 3)  Recent Labs    03/01/18 0733  GLUCAP 65     Physical Exam General: No apparent distress   HEENT: not dry Lungs: Normal effort. Lungs clear to auscultation, no crackles or wheezes. Cardiovascular: Regular rate and rhythm, no edema Abdomen: S/NT/ND; BS(+) Musculoskeletal:  unchanged Neurological: No new neurological deficits Wounds: N/A    Skin: clear  Aging changes Mental state: Alert, cooperative    Lab Results: BMET    Component Value Date/Time   NA 141 02/21/2018 0624   K 4.1 02/21/2018 0624   CL 104 02/21/2018 0624   CO2 30 02/21/2018 0624   GLUCOSE 77 02/21/2018 0624   BUN 15 02/21/2018 0624   CREATININE 1.05 02/21/2018 0624   CALCIUM 9.0 02/21/2018 0624   GFRNONAA >60 02/21/2018 0624   GFRAA >60 02/21/2018 0624   CBC    Component Value Date/Time   WBC 9.5 02/21/2018 0624   RBC 3.39 (L) 02/21/2018 0624   HGB 11.6 (L) 02/21/2018 0624   HCT 33.9 (L) 02/21/2018 0624   PLT 273 02/21/2018 0624   MCV 100.0 02/21/2018 0624   MCH 34.2 (H) 02/21/2018 0624   MCHC 34.2 02/21/2018 0624   RDW 12.4 02/21/2018 0624   LYMPHSABS 1.9 02/21/2018 0624   MONOABS 0.9 02/21/2018 0624   EOSABS 0.3 02/21/2018 0624   BASOSABS 0.0 02/21/2018 0624    Studies/Results: No results found.  Medications: I have reviewed the patient's current medications.  Assessment/Plan:    1.  Status post acute large nonhemorrhagic  temporal occipital lobe PCA territory stroke with additional acute posterior circulation posterior watershed infarcts.  CIR 2.  DVT prophylaxis.  SCDs.  Eliquis 3.  Pain management-Tylenol 4.  Depression-on Lexapro 5.  SVT.  The rate is controlled.  On Eliquis and aspirin 6.  Type 2 diabetes.  Sliding scale insulin 7.  History of urinary retention 8.  Hyperlipidemia-Crestor 9.  Constipation-laxatives as needed       Length of stay, days: 10  Sonda PrimesAlex Plotnikov , MD 03/02/2018, 11:46 AM

## 2018-03-02 NOTE — Progress Notes (Signed)
Patient complained of pain on his bottom stage II noted foam changed. WOC was consulted per progress notes.. Encouraged patient to stay on his side . Pillow wedge used on his back and bottom.

## 2018-03-02 NOTE — Progress Notes (Signed)
Occupational Therapy Weekly Progress Note  Patient Details  Name: Nicholas [name] MRN: [id number] Date of Birth: 1951/03/25  Beginning of progress report period: 02/21/18 End of progress report period: 03/02/18  Today's Date: 03/02/2018 OT Individual Time: 2778-2423 OT Individual Time Calculation (min): 60 min   Patient has met 3 of 3 short term goals.    Pt has made steady progress at time of report. Functionally, he has improved from time of eval where he required Mod A for stand pivot transfers, to now completing bathroom transfers at ambulatory level using RW with Min A. Pt initially had significant difficultly scanning to Rt for functional transfers and for locating necessary ADL items. At time of report, he can now meet visual task demands with min vcs, modifications, and extra time. He continues to verbalize feelings of frustration and discouragement regarding his current level of functioning, especially visual changes. The interprofessional team has provided him with emotional support and encouragement to increase feelings of self efficacy and volition during CIR stay. He is on target for LTG achievement, with d/c plan of going SNF at this time. Continue POC.  Patient continues to demonstrate the following deficits: muscle weakness, decreased cardiorespiratoy endurance, impaired timing and sequencing, ataxia and decreased coordination, decreased visual acuity, field cut and hemianopsia, decreased awareness, decreased problem solving, decreased safety awareness and decreased memory and decreased standing balance, decreased postural control and decreased balance strategies and therefore will continue to benefit from skilled OT intervention to enhance overall performance with BADL.  Patient progressing toward long term goals..  Continue plan of care.  OT Short Term Goals Week 1:  OT Short Term Goal 1 (Week 1): Pt will complete toilet transfer with Min A and LRAD OT Short Term Goal 1 -  Progress (Week 1): Met OT Short Term Goal 2 (Week 1): Pt will doff/don footwear with close supervision  OT Short Term Goal 2 - Progress (Week 1): Met OT Short Term Goal 3 (Week 1): Pt will scan to Rt for grooming items with mod multimodal cues  OT Short Term Goal 3 - Progress (Week 1): Met Week 2:  OT Short Term Goal 1 (Week 2): STGs=LTGs due to ELOS  Skilled Therapeutic Interventions/Progress Updates:    Pt greeted EOB, verbalizing feeling very frustrated due to urinary incontinence at night. Per RN, they're establishing timed toileting to address this due to frequency of occurrence. Pt motivated to shower today. Tx focus on balance, functional ambulation with device, activity tolerance, and Rt attention. OT modified bathroom to increase visual contrast/ease of scanning. Pt continues to report that he feels like he's "going blind" and feels discouraged by this. He reported modifications helped him locate importance features of bathroom and locate soap bottles/HH shower hose. Educated pt throughout on field cut and vision changes post CVA. Reinforced implementation of compensatory strategies to address this. Overall, he completed bathing, dressing, and functional transfers at ambulatory level with steady assist using RW. 1 posterior LOB while backing up to recliner near end of session, with pt requiring Mod A and step by step cues to recover. Pt left in recliner with LEs elevated, safety belt fastened, and all needs within reach.   Therapy Documentation Precautions:  Precautions Precautions: Fall(Simultaneous filing. User may not have seen previous data.) Precaution Comments: Rt visual field cut(Simultaneous filing. User may not have seen previous data.) Restrictions Weight Bearing Restrictions: No Pain: No c/o pain during session   ADL: ADL ADL Comments: Please see functional navigator for ADL status  See Function Navigator for Current Functional Status.   Therapy/Group: Individual  Therapy  Sameen Leas A Hayleigh Bawa 03/02/2018, 10:40 AM

## 2018-03-03 ENCOUNTER — Inpatient Hospital Stay (HOSPITAL_COMMUNITY): Payer: Medicare Other | Admitting: Speech Pathology

## 2018-03-03 ENCOUNTER — Inpatient Hospital Stay (HOSPITAL_COMMUNITY): Payer: Medicare Other | Admitting: Occupational Therapy

## 2018-03-03 ENCOUNTER — Inpatient Hospital Stay (HOSPITAL_COMMUNITY): Payer: Medicare Other | Admitting: Physical Therapy

## 2018-03-03 NOTE — Progress Notes (Signed)
Physical Therapy Note  Patient Details  Name: Nicholas [name] J Sharman MRN: [id number] Date of Birth: 08/24/1951 Today's Date: 03/03/2018    Time: [id number] 70 minutes  1:1 No c/o pain.  Pt able to gait throughout unit with close supervision/min A with RW, cues for visual scanning and to avoid obstacles.  Pt still with wide BOS, parkinsonian like gait.  Standing squat and reach task with visual scanning task with min cues to locate objects, pt able to use sense of touch to find and maneuver horseshoes for task, min guard for balance.  Step ups to 6'' step 2 x 10 with alternating LEs with min A with RW.  Tap ups to improve coordination with max cuing for Lt LE coordination, min A for balance.  Coordination with adductor squeeze with alternating LAQ and HS Curl with theraband with min/mod manual facilitation for Lt LE alignment and control. Standing kinetron 4 x 10 reps with pt requiring prolonged rest breaks in between, min manual facilitation for Lt LE alignment and hip control.  Pt left in room with needs at hand, chair alarm and quick release belt in place.   Yilin Weedon 03/03/2018, 10:41 AM

## 2018-03-03 NOTE — Progress Notes (Signed)
Pt has stage II to sacrum with foam in place. RN ordered air mattress overlay for prevention of further breakdown. Pt does not like bed and requesting to have the other bed back. RN educated on importance of air mattress with prevention of skin breakdown. Pt does not care and wants the centrella bed back. Order for air mattress d/c.

## 2018-03-03 NOTE — Progress Notes (Signed)
Speech Language Pathology Daily Session Note  Patient Details  Name: Nicholas [name] MRN: [id number] Date of Birth: 10/06/1951  Today's Date: 03/03/2018 SLP Individual Time: 1300-1400 SLP Individual Time Calculation (min): 60 min  Short Term Goals: Week 2: SLP Short Term Goal 1 (Week 2): Pt will demonstrate intellectual awareness by identifying 2 physical and cognitive deficits with Min A verbal cues.  SLP Short Term Goal 2 (Week 2): Pt will complete basic problem solving tasks with Min A verbal cues.  SLP Short Term Goal 3 (Week 2): Pt will recall daily new information with utilization of external aids with Mod A verbal cues.  SLP Short Term Goal 4 (Week 2): Pt will demonstrate sustained attention for 30 minutes during functional tasks with Min A verbal cues.  SLP Short Term Goal 5 (Week 2): Pt will attend to right field of environment during functional tasks with Min A verbal cues.   Skilled Therapeutic Interventions: Skilled treatment session focused on cognitive goals. SLP facilitated session by providing Supervision verbal cues for problem solving during a basic task (war card game). Pt required Min A verbal cues for right attention and for sustained attention for ~30 minutes with Min A verbal cues for redirection. Pt demonstrated recall of new information with Max A verbal cues throughout session. Pt left upright in wheelchair with quick release belt on and all needs within reach. Continue with current plan of care.      Function:  Cognition Comprehension Comprehension assist level: Understands basic 90% of the time/cues < 10% of the time  Expression   Expression assist level: Expresses basic 90% of the time/requires cueing < 10% of the time.  Social Interaction Social Interaction assist level: Interacts appropriately 75 - 89% of the time - Needs redirection for appropriate language or to initiate interaction.  Problem Solving Problem solving assist level: Solves basic 90% of the  time/requires cueing < 10% of the time  Memory Memory assist level: Recognizes or recalls 25 - 49% of the time/requires cueing 50 - 75% of the time    Pain Pain Assessment Pain Score: 0-No pain  Therapy/Group: Individual Therapy  Roxan HockeyYahui Baylon Santelli  SLP - Student 03/03/2018, 3:02 PM

## 2018-03-03 NOTE — Progress Notes (Addendum)
Occupational Therapy Session Note  Patient Details  Name: Nicholas [name] MRN: [id number] Date of Birth: 04/27/1951    Today's Date: 03/03/2018 OT Individual Time: 0800-0910 and 1450-1520 OT Individual Time Calculation (min): 70 min and 30 min  Short Term Goals: Week 2:  OT Short Term Goal 1 (Week 2): STGs=LTGs due to ELOS  Skilled Therapeutic Interventions/Progress Updates:    Pt greeted EOB with RN present. Agreeable to shower. Tx focus on visual compensatory strategies, Rt attention, dynamic balance, activity tolerance, and DME safety during bathing, dressing, toileting, and grooming tasks. Pt completed all functional transfers using RW at ambulatory level with steady assist. Mod cues for DME mgt throughout, especially while backing up to surfaces. Pt scanning for necessary items with min vcs. He reports vision changes daily, some days better than others. Pt also appeared more receptive to education and encouragement today. Was a little more positive, reporting "I'll do whatever you all tell me to do" when challenged by a task instead of giving up. He completed BADLs with steady assist using RW for sit<stands. Pt left in w/c with safety belt fastened and all needs within reach.     2nd Session 1:1 tx (30 min) Pt greeted in w/c. Motivated to shave. Worked on visual scanning/compensatory strategies, activity tolerance, and ADL retraining. Pt able to complete task with overall Min A for thoroughness/safety around neck scar. At session exit he was left in w/c with safety belt fastened and all needs within reach.   Therapy Documentation Precautions:  Precautions Precautions: Fall(Simultaneous filing. User may not have seen previous data.) Precaution Comments: Rt visual field cut(Simultaneous filing. User may not have seen previous data.) Restrictions Weight Bearing Restrictions: No  Pain: Pain Assessment Pain Scale: 0-10 Pain Score: 0-No pain ADL: ADL ADL Comments: Please see functional  navigator for ADL status     See Function Navigator for Current Functional Status.   Therapy/Group: Individual Therapy  Joscelyne Renville A Ovie Cornelio 03/03/2018, 12:44 PM

## 2018-03-03 NOTE — NC FL2 (Signed)
[place] MEDICAID FL2 LEVEL OF CARE SCREENING TOOL     IDENTIFICATION  Patient Name: Nicholas [name] Birthdate: 02/17/1951 Sex: male Admission Date (Current Location): 02/20/2018  [facility] [place] [place]   Facility and Address:  [facility], [address], [place], [place] [address]      Provider Number: [id number]  Attending Physician Name and Address:  Ranelle OysterSwartz, Zachary T, MD  Relative Name and Phone Number:       Current Level of Care: Other (Comment)(Acute Inpatient Rehab Unit) Recommended Level of Care: Skilled Nursing Facility Prior Approval Number:    Date Approved/Denied:   PASRR Number: [id number]  Discharge Plan: SNF    Current Diagnoses: Patient Active Problem List   Diagnosis Date Noted  . Aortic stenosis 02/20/2018  . Occipital stroke (HCC) 02/20/2018  . Posterior cerebral artery embolism, left   . Right homonymous hemianopsia   . Gait disturbance, post-stroke   . Acquired left foot drop   . Urinary retention   . Diabetes mellitus type 2 in nonobese (HCC)   . Acute blood loss anemia   . Diastolic dysfunction   . Nonrheumatic aortic valve stenosis   . Elevated troponin 02/18/2018  . Stenosis of left carotid artery   . Carotid occlusion, right   . Diabetes mellitus (HCC)   . CVA (cerebral vascular accident) (HCC) 02/17/2018  . Pressure injury of skin 10/28/2017  . SBO (small bowel obstruction) (HCC) 10/17/2017  . Bacterial UTI 03/19/2017  . Adjustment disorder   . Slow transit constipation   . Weakness of both lower extremities   . Bilateral foot-drop   . Lumbar radiculopathy 03/13/2017  . Lumbar spinal stenosis 03/13/2017  . Foot drop, right   . Urinary retention due to benign prostatic hyperplasia   . Benign essential HTN   . History of CVA (cerebrovascular accident) 03/11/2017  . Cognitive deficits, late effect of cerebrovascular disease 07/23/2014  . Ataxia, late effect of cerebrovascular disease  07/23/2014  . PSVT (paroxysmal supraventricular tachycardia) (HCC) 09/15/2010  . Aortic valve disorder 08/09/2010  . PVD (peripheral vascular disease) (HCC) 08/09/2010  . Hyperlipidemia 08/08/2010  . TOBACCO ABUSE 08/08/2010  . Essential hypertension 08/08/2010  . CAD, NATIVE VESSEL 08/08/2010  . Occlusion and stenosis of carotid artery 08/08/2010    Orientation RESPIRATION BLADDER Height & Weight     Self, Time, Situation, Place  Normal Continent(Occasional incont at night) Weight: 75 kg (165 lb 5.5 oz) Height:  5\' 11"  (180.3 cm)  BEHAVIORAL SYMPTOMS/MOOD NEUROLOGICAL BOWEL NUTRITION STATUS      Continent Diet(regular and thin liquids)  AMBULATORY STATUS COMMUNICATION OF NEEDS Skin   Limited Assist Verbally PU Stage and Appropriate Care   PU Stage 2 Dressing: (foam dressing to sacrum; change q 3 days)                   Personal Care Assistance Level of Assistance  Bathing, Feeding, Dressing Bathing Assistance: Limited assistance Feeding assistance: Limited assistance Dressing Assistance: Limited assistance     Functional Limitations Info             SPECIAL CARE FACTORS FREQUENCY  PT (By licensed PT), OT (By licensed OT), Speech therapy     PT Frequency: 5x/wk OT Frequency: 5x/wk     Speech Therapy Frequency: 5x/wk      Contractures Contractures Info: Not present    Additional Factors Info  Code Status, Psychotropic, Allergies Code Status Info: full Allergies Info: NKDA Psychotropic Info:  see MAR         Current Medications (03/03/2018):  This is the current hospital active medication list Current Facility-Administered Medications  Medication Dose Route Frequency Provider Last Rate Last Dose  . acetaminophen (TYLENOL) tablet 650 mg  650 mg Oral Q4H PRN Charlton Amor, PA-C   650 mg at 02/26/18 1610   Or  . acetaminophen (TYLENOL) solution 650 mg  650 mg Per Tube Q4H PRN Angiulli, Mcarthur Rossetti, PA-C       Or  . acetaminophen (TYLENOL) suppository  650 mg  650 mg Rectal Q4H PRN Angiulli, Mcarthur Rossetti, PA-C      . apixaban (ELIQUIS) tablet 5 mg  5 mg Oral BID AngiulliMcarthur Rossetti, PA-C   5 mg at 03/03/18 9604  . aspirin EC tablet 81 mg  81 mg Oral Daily Ranelle Oyster, MD   81 mg at 03/03/18 5409  . escitalopram (LEXAPRO) tablet 10 mg  10 mg Oral QHS Ranelle Oyster, MD   10 mg at 03/02/18 2101  . metoprolol tartrate (LOPRESSOR) injection 5 mg  5 mg Intravenous Q4H PRN Angiulli, Mcarthur Rossetti, PA-C      . ondansetron [facility]) tablet 4 mg  4 mg Oral Q6H PRN Angiulli, Mcarthur Rossetti, PA-C       Or  . ondansetron [facility]) injection 4 mg  4 mg Intravenous Q6H PRN Angiulli, Mcarthur Rossetti, PA-C      . rosuvastatin (CRESTOR) tablet 10 mg  10 mg Oral Daily Charlton Amor, PA-C   10 mg at 03/03/18 8119  . senna-docusate (Senokot-S) tablet 2 tablet  2 tablet Oral QHS Ranelle Oyster, MD   2 tablet at 03/01/18 2253  . sorbitol 70 % solution 30 mL  30 mL Oral Daily PRN Charlton Amor, PA-C   30 mL at 02/24/18 1478     Discharge Medications: Please see discharge summary for a list of discharge medications.  Relevant Imaging Results:  Relevant Lab Results:   Additional Information SS#: [id number]  Amada Jupiter, LCSW

## 2018-03-03 NOTE — Progress Notes (Signed)
[facility] PHYSICAL MEDICINE & REHABILITATION     PROGRESS NOTE    Subjective/Complaints: Seems to be in good spirits this morning. Excited to get in shower. Denies pain. Appetite [name]  ROS: Patient denies fever, rash, sore throat, blurred vision, nausea, vomiting, diarrhea, cough, shortness of breath or chest pain, joint or back pain, headache, or mood change.    Objective: Vital Signs: Blood pressure (!) 150/53, pulse 73, temperature 98.5 F (36.9 C), temperature source Oral, resp. rate 16, height 5\' 11"  (1.803 m), weight 75 kg (165 lb 5.5 oz), SpO2 97 %. No results found. No results for input(s): WBC, HGB, HCT, PLT in the last 72 hours. No results for input(s): NA, K, CL, GLUCOSE, BUN, CREATININE, CALCIUM in the last 72 hours.  Invalid input(s): CO CBG (last 3)  Recent Labs    03/01/18 0733  GLUCAP 65    Wt Readings from Last 3 Encounters:  02/26/18 75 kg (165 lb 5.5 oz)  02/18/18 78.3 kg (172 lb 9.9 oz)  10/17/17 85.5 kg (188 lb 7.9 oz)    Physical Exam:  Constitutional: No distress . Vital signs reviewed. HEENT: EOMI, oral membranes moist Cardiovascular: RRR without murmur. No JVD    Respiratory: CTA Bilaterally without wheezes or rales. Normal effort    GI: BS +, non-tender, non-distended  Skin. Patient with large scar right side of neck  Musculoskeletal: No edema or tenderness in extremities Neurological: He isalert. Delayed processing. Impaired insight and awareness dense right homonymous hemianopsia is unchanged Motor: 4+/5 grossly throughoutexcept 0/5left ankle dorsiflexors-stable exam Senses LT and pain in all 4's.  Psych: smiling, more animated today   Assessment/Plan: 1. Decreased functional mobility and visual-spatial deficits secondary to left temporal-occipital infarct which require 3+ hours per day of interdisciplinary therapy in a comprehensive inpatient rehab setting. Physiatrist is providing close team supervision and 24 hour management of  active medical problems listed below. Physiatrist and rehab team continue to assess barriers to discharge/monitor patient progress toward functional and medical goals.  Function:  Bathing Bathing position Bathing activity did not occur: N/A Position: Shower  Bathing parts Body parts bathed by patient: Right arm, Left arm, Chest, Abdomen, Front perineal area, Right upper leg, Left upper leg, Right lower leg, Left lower leg, Buttocks Body parts bathed by helper: Back  Bathing assist Assist Level: Touching or steadying assistance(Pt > 75%)      Upper Body Dressing/Undressing Upper body dressing   What is the patient wearing?: Pull over shirt/dress     Pull over shirt/dress - Perfomed by patient: Thread/unthread right sleeve, Thread/unthread left sleeve, Put head through opening, Pull shirt over trunk          Upper body assist Assist Level: Supervision or verbal cues, Set up   Set up : To obtain clothing/put away  Lower Body Dressing/Undressing Lower body dressing   What is the patient wearing?: Pants, Non-skid slipper socks Underwear - Performed by patient: Thread/unthread right underwear leg, Thread/unthread left underwear leg, Pull underwear up/down   Pants- Performed by patient: Thread/unthread right pants leg, Thread/unthread left pants leg, Pull pants up/down   Non-skid slipper socks- Performed by patient: Don/doff right sock, Don/doff left sock Non-skid slipper socks- Performed by helper: Don/doff right sock, Don/doff left sock                  Lower body assist Assist for lower body dressing: Touching or steadying assistance (Pt > 75%)      Toileting Toileting Toileting activity did not  occur: No continent bowel/bladder event Toileting steps completed by patient: Adjust clothing prior to toileting, Performs perineal hygiene Toileting steps completed by helper: Adjust clothing after toileting Toileting Assistive Devices: Grab bar or rail  Toileting assist Assist  level: Touching or steadying assistance (Pt.75%)   Transfers Chair/bed transfer   Chair/bed transfer method: Ambulatory Chair/bed transfer assist level: Touching or steadying assistance (Pt > 75%) Chair/bed transfer assistive device: Armrests, Patent attorneyWalker     Locomotion Ambulation     Max distance: 40 Assist level: Touching or steadying assistance (Pt > 75%)   Wheelchair   Type: Manual Max wheelchair distance: 6520ft Assist Level: Touching or steadying assistance (Pt > 75%)  Cognition Comprehension Comprehension assist level: Understands basic 90% of the time/cues < 10% of the time  Expression Expression assist level: Expresses basic 90% of the time/requires cueing < 10% of the time.  Social Interaction Social Interaction assist level: Interacts appropriately 50 - 74% of the time - May be physically or verbally inappropriate.  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   -continue PT, OT, SLP to address cognitive/visual-spatial deficits 2. DVT Prophylaxis/Anticoagulation: SCDs. Monitor for any signs of DVT 3. Pain Management:Tylenol as needed 4. Mood:team providing emotional support as possible. Ongoing depression   -increased lexapro to 10mg  qhs 5. Neuropsych: This patientiscapable of making decisions on hisown behalf. 6. Skin/Wound Care:Routine skin checks 7. Fluids/Electrolytes/Nutrition:encourage PO   -appetite fair.   8.Supraventricular tachycardia with history of CAD/PCI and noted findings of  aortic stenosis. Current plan to follow up outpatient with cardiology services.  Began Eliquis 5 mg twice daily on 3/17 with decreased aspirin to 81 mg.  Now off plavix.    9.Diet controlled diabetes mellitus. Hemoglobin A1c 4.8. Currently with SSI.   -sugars under tight control.    -Stopped CBG checks.  Diet changed to regular 10.History of urinary retention.  Intermittent urinary incontinence.    - ua/ucx reviewed and are negative 11.Hyperlipidemia. Crestor 12.Constipation. Laxative assistance   -Changed Senokot-S to 2 tablets scheduled at bedtime    -+BM 3/21---understands importance of regular bm   LOS (Days) 11 A FACE TO FACE EVALUATION WAS PERFORMED  Ranelle OysterZachary T Lincoln Kleiner, MD 03/03/2018 9:17 AM

## 2018-03-04 ENCOUNTER — Inpatient Hospital Stay (HOSPITAL_COMMUNITY): Payer: Medicare Other | Admitting: Physical Therapy

## 2018-03-04 ENCOUNTER — Inpatient Hospital Stay (HOSPITAL_COMMUNITY): Payer: Medicare Other | Admitting: Speech Pathology

## 2018-03-04 NOTE — Progress Notes (Signed)
Physical Therapy Note  Patient Details  Name: Nicholas [name] MRN: [id number] Date of Birth: 08/31/1951 Today's Date: 03/04/2018    Time: 830-927 57 minutes  1:1 No c/o pain.  Pt excited to make chocolate chip cookies this session.  Pt performs gait in home environment with min A for obstacle negotiation, supervision in controlled environment with RW.  Standing cooking task with pt able to open packages, pour and mix ingredients with set up assist, supervision for balance.  Pt requires mod cues for problem solving kitchen tasks.  Pt stands to wash dishes with supervision, cues due to visual deficits.  Pt left in room in good spirits after baking task with needs at hand, alarm and quick release belt in place.   Larysa Pall 03/04/2018, 9:29 AM

## 2018-03-04 NOTE — Progress Notes (Signed)
Speech Language Pathology Daily Session Note  Patient Details  Name: Nicholas [name] J Stick MRN: [id number] Date of Birth: 09/05/1951  Today's Date: 03/04/2018 SLP Individual Time: 0730-0830 SLP Individual Time Calculation (min): 60 min  Short Term Goals: Week 2: SLP Short Term Goal 1 (Week 2): Pt will demonstrate intellectual awareness by identifying 2 physical and cognitive deficits with Min A verbal cues.  SLP Short Term Goal 2 (Week 2): Pt will complete basic problem solving tasks with Min A verbal cues.  SLP Short Term Goal 3 (Week 2): Pt will recall daily new information with utilization of external aids with Mod A verbal cues.  SLP Short Term Goal 4 (Week 2): Pt will demonstrate sustained attention for 30 minutes during functional tasks with Min A verbal cues.  SLP Short Term Goal 5 (Week 2): Pt will attend to right field of environment during functional tasks with Min A verbal cues.   Skilled Therapeutic Interventions: Skilled treatment session focused on cognitive goals. SLP facilitated session by providing Min-Mod A verbal cues and extra time for visual scanning during tray set-up, self-feeding and basic self-care tasks. SLP also facilitated session by providing Max A verbal and visual cues for problem solving for patient to locate letters in order to spell his name. Patient left upright in wheelchair with alarm on and quick release belt in place. Continue with current plan of care.      Function:   Cognition Comprehension Comprehension assist level: Understands basic 90% of the time/cues < 10% of the time  Expression   Expression assist level: Expresses basic 90% of the time/requires cueing < 10% of the time.  Social Interaction Social Interaction assist level: Interacts appropriately 75 - 89% of the time - Needs redirection for appropriate language or to initiate interaction.  Problem Solving Problem solving assist level: Solves basic 50 - 74% of the time/requires cueing 25 - 49% of  the time  Memory Memory assist level: Recognizes or recalls 25 - 49% of the time/requires cueing 50 - 75% of the time    Pain Pain Assessment Pain Score: 0-No pain  Therapy/Group: Individual Therapy  Draxton Luu 03/04/2018, 12:38 PM

## 2018-03-04 NOTE — Progress Notes (Signed)
Physical Therapy Session Note  Patient Details  Name: Nicholas [name] FullingWillie J Schnabel MRN: [id number] Date of Birth: 10/03/1951  Today's Date: 03/04/2018 PT Individual Time: 1100-1200 PT Individual Time Calculation (min): 60 min   Short Term Goals: Week 2:  PT Short Term Goal 1 (Week 2): = LTG  Skilled Therapeutic Interventions/Progress Updates:    Pt seated in w/c in room, agreeable to participate in therapy session. Pt reports no pain this AM. Sit to stand with SBA to RW, v/c for hand placement for safety. Ambulation 3 x 150 ft with RW and SBA, verbal cues to avoid obstacles due to visual deficits. Pt exhibits shuffling gait pattern and wide BOS and is not able to correct with verbal cues. Standing balance: bean bag toss with one UE support and SBA, Romberg and modified tandem stance with no UE support and min assist 3 x 30 sec each. Pt becomes easily frustrated during therapy session due to visual deficits and fear of being a burden to his family. Educated pt on progress he has made during therapy sessions and encouraged him to focus on progress that has been made. Standing balance while using urinal, min assist for balance. Pt requires assist with clothing management before and after urinal use. Pt left seated in w/c in room with needs in reach, chair alarm in place, quick release belt in place.  Therapy Documentation Precautions:  Precautions Precautions: Fall(Simultaneous filing. User may not have seen previous data.) Precaution Comments: Rt visual field cut(Simultaneous filing. User may not have seen previous data.) Restrictions Weight Bearing Restrictions: No  See Function Navigator for Current Functional Status.   Therapy/Group: Individual Therapy  Peter Congoaylor Elizar Alpern, PT, DPT  03/04/2018, 2:00 PM

## 2018-03-04 NOTE — Plan of Care (Signed)
Stage II to sacrum, foam drsg in place. Pt in bed on side repositioned q2h and prn.

## 2018-03-04 NOTE — Progress Notes (Signed)
[facility]IC[facility] & REHABILITATION     PROGRESS NOTE    Subjective/Complaints: Patient working on breakfast.  Speech therapy present in room.  Focused on how much food he is eating and that we are "fattening him up".  States he slept fairly well last night  ROS: Patient denies fever, rash, sore throat, blurred vision, nausea, vomiting, diarrhea, cough, shortness of breath or chest pain, joint or back pain, headache, or mood change.   Objective: Vital Signs: Blood pressure (!) 124/42, pulse 74, temperature 97.8 F (36.6 C), temperature source Oral, resp. rate 17, height 5\' 11"  (1.803 m), weight 75 kg (165 lb 5.5 oz), SpO2 98 %. No results found. No results for input(s): WBC, HGB, HCT, PLT in the last 72 hours. No results for input(s): NA, K, CL, GLUCOSE, BUN, CREATININE, CALCIUM in the last 72 hours.  Invalid input(s): CO CBG (last 3)  No results for input(s): GLUCAP in the last 72 hours.  Wt Readings from Last 3 Encounters:  02/26/18 75 kg (165 lb 5.5 oz)  02/18/18 78.3 kg (172 lb 9.9 oz)  10/17/17 85.5 kg (188 lb 7.9 oz)    Physical Exam:  Constitutional: No distress . Vital signs reviewed. HEENT: EOMI, oral membranes moist Cardiovascular: RRR without murmur. No JVD    Respiratory: CTA Bilaterally without wheezes or rales. Normal effort    GI: BS +, non-tender, non-distended  Skin. Patient with large scar right side of neck  Musculoskeletal: No edema or tenderness in extremities Neurological: He isalert. Delayed processing. Impaired insight and awareness dense right homonymous hemianopsia essentially stable but patient seems to be more aware and scanning to the right side more effectively. Motor: 4+/5 grossly throughout---except 0/5left ankle dorsiflexors-stable exam Senses LT and pain in all 4's.  Psych: Notably more positive and upbeat today.   Assessment/Plan: 1. Decreased functional mobility and visual-spatial deficits secondary to left  temporal-occipital infarct which require 3+ hours per day of interdisciplinary therapy in a comprehensive inpatient rehab setting. Physiatrist is providing close team supervision and 24 hour management of active medical problems listed below. Physiatrist and rehab team continue to assess barriers to discharge/monitor patient progress toward functional and medical goals.  Function:  Bathing Bathing position Bathing activity did not occur: N/A Position: Shower  Bathing parts Body parts bathed by patient: Right arm, Left arm, Chest, Abdomen, Front perineal area, Right upper leg, Left upper leg, Right lower leg, Left lower leg, Buttocks Body parts bathed by helper: Back  Bathing assist Assist Level: Touching or steadying assistance(Pt > 75%)      Upper Body Dressing/Undressing Upper body dressing   What is the patient wearing?: Pull over shirt/dress     Pull over shirt/dress - Perfomed by patient: Thread/unthread right sleeve, Thread/unthread left sleeve, Put head through opening, Pull shirt over trunk          Upper body assist Assist Level: Supervision or verbal cues, Set up   Set up : To obtain clothing/put away  Lower Body Dressing/Undressing Lower body dressing   What is the patient wearing?: Pants, Non-skid slipper socks Underwear - Performed by patient: Thread/unthread right underwear leg, Thread/unthread left underwear leg, Pull underwear up/down   Pants- Performed by patient: Thread/unthread right pants leg, Thread/unthread left pants leg, Pull pants up/down   Non-skid slipper socks- Performed by patient: Don/doff right sock, Don/doff left sock Non-skid slipper socks- Performed by helper: Don/doff right sock, Don/doff left sock  Lower body assist Assist for lower body dressing: Touching or steadying assistance (Pt > 75%)      Toileting Toileting Toileting activity did not occur: No continent bowel/bladder event Toileting steps completed by  patient: Adjust clothing prior to toileting, Performs perineal hygiene Toileting steps completed by helper: Adjust clothing after toileting Toileting Assistive Devices: Grab bar or rail  Toileting assist Assist level: Touching or steadying assistance (Pt.75%)   Transfers Chair/bed transfer   Chair/bed transfer method: Ambulatory Chair/bed transfer assist level: Touching or steadying assistance (Pt > 75%) Chair/bed transfer assistive device: Armrests, Patent attorney     Max distance: 40 Assist level: Touching or steadying assistance (Pt > 75%)   Wheelchair   Type: Manual Max wheelchair distance: 45ft Assist Level: Touching or steadying assistance (Pt > 75%)  Cognition Comprehension Comprehension assist level: Understands basic 90% of the time/cues < 10% of the time  Expression Expression assist level: Expresses basic 90% of the time/requires cueing < 10% of the time.  Social Interaction Social Interaction assist level: Interacts appropriately 75 - 89% of the time - Needs redirection for appropriate language or to initiate interaction.  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   -continue PT, OT, SLP   -Team conference today 2. DVT Prophylaxis/Anticoagulation: SCDs. Monitor for any signs of DVT 3. Pain Management:Tylenol as needed 4. Mood/reactive depression:team providing emotional support as possible.     -increased lexapro to 10mg  qhs with notable improvements 5. Neuropsych: This  patientiscapable of making decisions on hisown behalf. 6. Skin/Wound Care:Routine skin checks 7. Fluids/Electrolytes/Nutrition:encourage PO   -appetite fair.   8.Supraventricular tachycardia with history of CAD/PCI and noted findings of aortic stenosis. Current plan to follow up outpatient with cardiology services.  Began Eliquis 5 mg twice daily on 3/17 with decreased aspirin to 81 mg.  Now off plavix.    9.Diet controlled diabetes mellitus. Hemoglobin A1c 4.8. Currently with SSI.   -sugars under tight control.    -Stopped CBG checks.  Diet changed to regular.  He is eating quite well 10.History of urinary retention.  Intermittent urinary incontinence.    - ua/ucx reviewed and are negative 11.Hyperlipidemia. Crestor 12.Constipation. Laxative assistance   -Changed Senokot-S to 2 tablets scheduled at bedtime    -+BM 3/24---understands importance of regular bm   LOS (Days) 12 A FACE TO FACE EVALUATION WAS PERFORMED  Ranelle Oyster, MD 03/04/2018 8:53 AM

## 2018-03-05 ENCOUNTER — Inpatient Hospital Stay (HOSPITAL_COMMUNITY): Payer: Medicare Other | Admitting: Occupational Therapy

## 2018-03-05 ENCOUNTER — Inpatient Hospital Stay (HOSPITAL_COMMUNITY): Payer: Medicare Other | Admitting: Physical Therapy

## 2018-03-05 ENCOUNTER — Inpatient Hospital Stay (HOSPITAL_COMMUNITY): Payer: Medicare Other | Admitting: Speech Pathology

## 2018-03-05 NOTE — Progress Notes (Signed)
Occupational Therapy Session Note  Patient Details  Name: Nicholas [name] MRN: [id number] Date of Birth: 02/03/1951  Today's Date: 03/05/2018 OT Individual Time: 1000-1045 and 1300-1356 OT Individual Time Calculation (min): 45 min and 56 min    Short Term Goals: Week 2:  OT Short Term Goal 1 (Week 2): STGs=LTGs due to ELOS  Skilled Therapeutic Interventions/Progress Updates:   Session 1: Upon entering the room, pt seated in wheelchair awaiting therapist arrival. Pt requesting to use bathroom. Pt ambulating short distance with close supervision and RW into bathroom. Pt required steady assistance for standing balance during LB clothing management and hygiene after having BM. Pt exiting the bathroom and performing hand hygiene and grooming while standing at sink with increased time to locate items on sink with min verbal guidance cues . Pt returning to wheelchair at end of session with chair alarm on and quick release belt donned.   Session 2: Upon entering the room,  Pt seated in wheelchair with no c/o pain and agreeable to OT intervention. Pt ambulating to bathroom with RW with close supervision and transferred onto TTB. Pt removing clothing from seated position with min verbal cues for safety awareness. Pt required min multimodal cues for initiation and sequence while bathing at shower level. Pt standing to wash buttocks and peri area while standing and holding onto grab bar with steady assistance from therapist. Pt drying and ambulating to sit on EOB to don clothing items. Pt with supervision for UB self care and min A balance while standing for LB clothing management. Pt returning to supine position in bed at end of session secondary to fatigue. Pillows places to protect hemiplegic side and for comfort. Bed alarm activated and call bell within reach upon exiting the room.   Therapy Documentation Precautions:  Precautions Precautions: Fall(Simultaneous filing. User may not have seen previous  data.) Precaution Comments: Rt visual field cut(Simultaneous filing. User may not have seen previous data.) Restrictions Weight Bearing Restrictions: No    ADL: ADL ADL Comments: Please see functional navigator for ADL status  See Function Navigator for Current Functional Status.   Therapy/Group: Individual Therapy  Alen BleacherBradsher, Cohl Behrens P 03/05/2018, 2:00 PM

## 2018-03-05 NOTE — Progress Notes (Signed)
[place] PHYSICAL MEDICINE & REHABILITATION     PROGRESS NOTE    Subjective/Complaints: Pt up with therapies. Upset because phone may have been taken away with linens.   ROS: Patient denies fever, rash, sore throat, blurred vision, nausea, vomiting, diarrhea, cough, shortness of breath or chest pain, joint or back pain, headache, or mood change.    Objective: Vital Signs: Blood pressure (!) 149/54, pulse 74, temperature 99.4 F (37.4 C), temperature source Oral, resp. rate 16, height 5\' 11"  (1.803 m), weight 75.1 kg (165 lb 9.1 oz), SpO2 96 %. No results found. No results for input(s): WBC, HGB, HCT, PLT in the last 72 hours. No results for input(s): NA, K, CL, GLUCOSE, BUN, CREATININE, CALCIUM in the last 72 hours.  Invalid input(s): CO CBG (last 3)  No results for input(s): GLUCAP in the last 72 hours.  Wt Readings from Last 3 Encounters:  03/05/18 75.1 kg (165 lb 9.1 oz)  02/18/18 78.3 kg (172 lb 9.9 oz)  10/17/17 85.5 kg (188 lb 7.9 oz)    Physical Exam:  Constitutional: No distress . Vital signs reviewed. HEENT: EOMI, oral membranes moist Cardiovascular: RRR without murmur. No JVD    Respiratory: CTA Bilaterally without wheezes or rales. Normal effort    GI: BS +, non-tender, non-distended  Skin. Patient with large scar right side of neck  Musculoskeletal: No edema or tenderness in extremities Neurological: He isalert. Delayed processing. Impaired insight and awareness dense right homonymous hemianopsia essentially stable but patient seems to be more aware and scanning to the right side more effectively. Motor: 4+/5 grossly throughout---except 0/5left ankle dorsiflexors-stable exam Senses LT and pain in all 4's.  Psych: Notably more positive and upbeat today.   Assessment/Plan: 1. Decreased functional mobility and visual-spatial deficits secondary to left temporal-occipital infarct which require 3+ hours per day of interdisciplinary therapy in a  comprehensive inpatient rehab setting. Physiatrist is providing close team supervision and 24 hour management of active medical problems listed below. Physiatrist and rehab team continue to assess barriers to discharge/monitor patient progress toward functional and medical goals.  Function:  Bathing Bathing position Bathing activity did not occur: N/A Position: Shower  Bathing parts Body parts bathed by patient: Right arm, Left arm, Chest, Abdomen, Front perineal area, Right upper leg, Left upper leg, Right lower leg, Left lower leg, Buttocks Body parts bathed by helper: Back  Bathing assist Assist Level: Touching or steadying assistance(Pt > 75%)      Upper Body Dressing/Undressing Upper body dressing   What is the patient wearing?: Pull over shirt/dress     Pull over shirt/dress - Perfomed by patient: Thread/unthread right sleeve, Thread/unthread left sleeve, Put head through opening, Pull shirt over trunk          Upper body assist Assist Level: Supervision or verbal cues, Set up   Set up : To obtain clothing/put away  Lower Body Dressing/Undressing Lower body dressing   What is the patient wearing?: Pants, Non-skid slipper socks Underwear - Performed by patient: Thread/unthread right underwear leg, Thread/unthread left underwear leg, Pull underwear up/down   Pants- Performed by patient: Thread/unthread right pants leg, Thread/unthread left pants leg, Pull pants up/down   Non-skid slipper socks- Performed by patient: Don/doff right sock, Don/doff left sock Non-skid slipper socks- Performed by helper: Don/doff right sock, Don/doff left sock                  Lower body assist Assist for lower body dressing: Touching or steadying assistance (Pt >  75%)      Toileting Toileting Toileting activity did not occur: No continent bowel/bladder event Toileting steps completed by patient: Performs perineal hygiene Toileting steps completed by helper: Adjust clothing prior to  toileting, Adjust clothing after toileting Toileting Assistive Devices: Grab bar or rail  Toileting assist Assist level: Touching or steadying assistance (Pt.75%)   Transfers Chair/bed transfer   Chair/bed transfer method: Ambulatory Chair/bed transfer assist level: Supervision or verbal cues Chair/bed transfer assistive device: Armrests, Patent attorneyWalker     Locomotion Ambulation     Max distance: 150 ft Assist level: Supervision or verbal cues   Wheelchair   Type: Manual Max wheelchair distance: 6720ft Assist Level: Touching or steadying assistance (Pt > 75%)  Cognition Comprehension Comprehension assist level: Understands basic 90% of the time/cues < 10% of the time  Expression Expression assist level: Expresses basic 90% of the time/requires cueing < 10% of the time.  Social Interaction Social Interaction assist level: Interacts appropriately 75 - 89% of the time - Needs redirection for appropriate language or to initiate interaction.  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   -continue PT, OT, SLP   -making gains in insight 2. DVT Prophylaxis/Anticoagulation: SCDs. Monitor for any signs of DVT 3. Pain Management:Tylenol as needed 4. Mood/reactive depression:team providing emotional support as possible.     -increased lexapro to 10mg  qhs with notable improvements 5. Neuropsych: This patientiscapable of making decisions on hisown behalf. 6. Skin/Wound Care:Routine skin checks 7.  Fluids/Electrolytes/Nutrition:encourage PO   -appetite fair.   8.Supraventricular tachycardia with history of CAD/PCI and noted findings of aortic stenosis. Current plan to follow up outpatient with cardiology services.  Began Eliquis 5 mg twice daily on 3/17 with decreased aspirin to 81 mg.  Now off plavix.    9.Diet controlled diabetes mellitus. Hemoglobin A1c 4.8. Currently with SSI.   -sugars under tight control.    -Stopped CBG checks.  Diet changed to regular.  He is eating quite well 10.History of urinary retention.  Intermittent urinary incontinence.    - ua/ucx reviewed and are negative 11.Hyperlipidemia. Crestor 12.Constipation. Laxative assistance   -Changed Senokot-S to 2 tablets scheduled at bedtime    -+BM 3/24---understands importance of regular bm   LOS (Days) 13 A FACE TO FACE EVALUATION WAS PERFORMED  Ranelle OysterZachary T Swartz, MD 03/05/2018 8:44 AM

## 2018-03-05 NOTE — Progress Notes (Signed)
Physical Therapy Note  Patient Details  Name: Nicholas [name] MRN: [id number] Date of Birth: 04/15/1951 Today's Date: 03/05/2018    Time: 830-915 45 minutes  1:1 No c/o pain.  Pt performs gait throughout unit with supervision in controlled environment.  Stair negotiation x 12 stairs with bilat handrails with min A, cues for Lt LE placement and safety with turning to come down stairs.  Sit to stand blocked practice with adductor squeeze with pt with increased difficulty, requires min A to keep ball in place, supervision to stand.  Step ups to 4'' step with Lt LE for strengthening x 20 reps with bilat UE assist. Attempt to have pt step up with LT LE with 1 UE assist, pt unable despite encouragement and assist for PT.  Tap ups with alternating LEs initially with bialt UE support, progressing to 1 UE support with min A. Pt left in room with needs at hand, alarm set.   Amaris Garrette 03/05/2018, 9:14 AM

## 2018-03-05 NOTE — Progress Notes (Signed)
Speech Language Pathology Daily Session Note  Patient Details  Name: Nicholas [name] MRN: [id number] Date of Birth: 12/11/1950  Today's Date: 03/05/2018 SLP Individual Time: 0730-0830 SLP Individual Time Calculation (min): 60 min  Short Term Goals: Week 2: SLP Short Term Goal 1 (Week 2): Pt will demonstrate intellectual awareness by identifying 2 physical and cognitive deficits with Min A verbal cues.  SLP Short Term Goal 2 (Week 2): Pt will complete basic problem solving tasks with Min A verbal cues.  SLP Short Term Goal 3 (Week 2): Pt will recall daily new information with utilization of external aids with Mod A verbal cues.  SLP Short Term Goal 4 (Week 2): Pt will demonstrate sustained attention for 30 minutes during functional tasks with Min A verbal cues.  SLP Short Term Goal 5 (Week 2): Pt will attend to right field of environment during functional tasks with Min A verbal cues.   Skilled Therapeutic Interventions: Skilled treatment session focused on cognitive goals. Upon arrival, NTs were transferring pt to bathroom using RW. Pt had a bowel movement and transferred back to wheelchair for breakfast. SLP facilitated session by providing Min A verbal cues for problem solving during self-feeding and Mod A verbal cues and extra time for visual scanning during breakfast. SLP also facilitated session by providing Mod A verbal and visual cues for problem solving for patient to locate letters in order to read therapists' names. Pt demonstrated recall of yesterday's baking task independently, but provided incorrect information regarding what he baked (saying it was brownies instead of cookies). Pt left upright in wheelchair with quick release belt on and all needs within reach. Continue with current plan of care.      Function:  Cognition Comprehension Comprehension assist level: Understands basic 90% of the time/cues < 10% of the time  Expression   Expression assist level: Expresses basic 90%  of the time/requires cueing < 10% of the time.  Social Interaction Social Interaction assist level: Interacts appropriately 75 - 89% of the time - Needs redirection for appropriate language or to initiate interaction.  Problem Solving Problem solving assist level: Solves basic 50 - 74% of the time/requires cueing 25 - 49% of the time  Memory Memory assist level: Recognizes or recalls 25 - 49% of the time/requires cueing 50 - 75% of the time    Pain Pain Assessment Pain Scale: 0-10 Pain Score: 0-No pain  Therapy/Group: Individual Therapy  Roxan HockeyYahui Malee Grays  SLP - Student 03/05/2018, 11:03 AM

## 2018-03-06 ENCOUNTER — Inpatient Hospital Stay (HOSPITAL_COMMUNITY): Payer: Medicare Other

## 2018-03-06 ENCOUNTER — Inpatient Hospital Stay (HOSPITAL_COMMUNITY): Payer: Medicare Other | Admitting: Speech Pathology

## 2018-03-06 ENCOUNTER — Inpatient Hospital Stay (HOSPITAL_COMMUNITY): Payer: Medicare Other | Admitting: Occupational Therapy

## 2018-03-06 ENCOUNTER — Encounter: Payer: Self-pay | Admitting: *Deleted

## 2018-03-06 NOTE — Progress Notes (Signed)
Speech Language Pathology Daily Session Note  Patient Details  Name: Nicholas [name] MRN: [id number] Date of Birth: 05/28/1951  Today's Date: 03/06/2018 SLP Individual Time: 0730-0830 SLP Individual Time Calculation (min): 60 min  Short Term Goals: Week 2: SLP Short Term Goal 1 (Week 2): Pt will demonstrate intellectual awareness by identifying 2 physical and cognitive deficits with Min A verbal cues.  SLP Short Term Goal 2 (Week 2): Pt will complete basic problem solving tasks with Min A verbal cues.  SLP Short Term Goal 3 (Week 2): Pt will recall daily new information with utilization of external aids with Mod A verbal cues.  SLP Short Term Goal 4 (Week 2): Pt will demonstrate sustained attention for 30 minutes during functional tasks with Min A verbal cues.  SLP Short Term Goal 5 (Week 2): Pt will attend to right field of environment during functional tasks with Min A verbal cues.   Skilled Therapeutic Interventions: Skilled treatment session focused on cognitive goals. SLP facilitated session by providing Min A verbal and tactile cues for problem solving and visual scanning during self-care and hygiene tasks. SLP further facilitated session by providing Max A verbal and tactile cues for problem solving during a basic calendar making task. Pt demonstrated sustained attention to self-care and hygiene tasks for ~15 minutes with Mod I; however, pt required Max verbal cues for sustained attention to more structured tasks for ~20 minutes due to low frustration tolerance. Pt also able to recall one event from yesterday's speech session independently. Pt left upright in wheelchair with alarm on, quick release belt on, and all needs within reach. Continue with current plan of care.      Function:  Cognition Comprehension Comprehension assist level: Understands basic 90% of the time/cues < 10% of the time  Expression   Expression assist level: Expresses basic 90% of the time/requires cueing < 10%  of the time.  Social Interaction Social Interaction assist level: Interacts appropriately 50 - 74% of the time - May be physically or verbally inappropriate.  Problem Solving Problem solving assist level: Solves basic 25 - 49% of the time - needs direction more than half the time to initiate, plan or complete simple activities  Memory Memory assist level: Recognizes or recalls 25 - 49% of the time/requires cueing 50 - 75% of the time    Pain Pain Assessment Pain Scale: 0-10 Pain Score: 0-No pain  Therapy/Group: Individual Therapy  Roxan HockeyYahui Sricharan Lacomb  SLP - Student 03/06/2018, 10:24 AM

## 2018-03-06 NOTE — Patient Care Conference (Signed)
Inpatient RehabilitationTeam Conference and Plan of Care Update Date: 03/05/2018   Time: 2:40 PM    Patient Name: Nicholas [name]      Medical Record Number: [id number]  Date of Birth: [birth year] Sex: Male         Room/Bed: 4W10C/4W10C-01 Payor Info: Payor: MEDICARE / Plan: MEDICARE PART A AND B / Product Type: *No Product type* /    Admitting Diagnosis: CVA  Admit Date/Time:  02/20/2018  5:47 PM Admission Comments: No comment available   Primary Diagnosis:  <principal problem not specified> Principal Problem: <principal problem not specified>  Patient Active Problem List   Diagnosis Date Noted  . Aortic stenosis 02/20/2018  . Occipital stroke (HCC) 02/20/2018  . Posterior cerebral artery embolism, left   . Right homonymous hemianopsia   . Gait disturbance, post-stroke   . Acquired left foot drop   . Urinary retention   . Diabetes mellitus type 2 in nonobese (HCC)   . Acute blood loss anemia   . Diastolic dysfunction   . Nonrheumatic aortic valve stenosis   . Elevated troponin 02/18/2018  . Stenosis of left carotid artery   . Carotid occlusion, right   . Diabetes mellitus (HCC)   . CVA (cerebral vascular accident) (HCC) 02/17/2018  . Pressure injury of skin 10/28/2017  . SBO (small bowel obstruction) (HCC) 10/17/2017  . Bacterial UTI 03/19/2017  . Adjustment disorder   . Slow transit constipation   . Weakness of both lower extremities   . Bilateral foot-drop   . Lumbar radiculopathy 03/13/2017  . Lumbar spinal stenosis 03/13/2017  . Foot drop, right   . Urinary retention due to benign prostatic hyperplasia   . Benign essential HTN   . History of CVA (cerebrovascular accident) 03/11/2017  . Cognitive deficits, late effect of cerebrovascular disease 07/23/2014  . 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 PSVT (paroxysmal supraventricular tachycardia) (HCC) 09/15/2010  . Aortic valve disorder 08/09/2010  . PVD (peripheral vascular disease) (HCC)  08/09/2010  . Hyperlipidemia 08/08/2010  . TOBACCO ABUSE 08/08/2010  . Essential hypertension 08/08/2010  . CAD, NATIVE VESSEL 08/08/2010  . Occlusion and stenosis of carotid artery 08/08/2010    Expected Discharge Date: Expected Discharge Date: (SNF)  Team Members Present: Physician leading conference: Dr. Faith Rogue Social Worker Present: Amada Jupiter, LCSW Nurse Present: Kennon Portela, RN PT Present: Judieth Keens, PT OT Present: Roney Mans, OT SLP Present: Feliberto Gottron, SLP PPS Coordinator present : Tora Duck, RN, CRRN     Current Status/Progress Goal Weekly Team Focus  Medical   Patient with ongoing right sided field cut.  Aware of deficits however.  Dissipating in therapy.  Mood seems improved with Lexapro  Ongoing management of blood pressure and cardiovascular parameters.  Mood, cv mgt as noted in chart   Bowel/Bladder   cont of B&B W occ incont overnight.  Mantain continency throughtout  Asses and offer urinal q 2 hrs and Prn   Swallow/Nutrition/ Hydration             ADL's   Steady assist overall using RW at ambulatory level. Still challenged by blurred vision/field cut. Exhibits depressive symptoms.   Supervision overall  ADL retraining, balance, vision strategies, functional transfers, cognitive remediation   Mobility   close supervision gait and transfers, mod cuing due to visual deficits  supervision overall  awareness, functional mobility   Communication             Safety/Cognition/ Behavioral Observations  Max  Min-Mod  A  attention, problem solving, visual scanning    Pain   co pain in buttocks DT Bed and Stg 2 wound   maintain adequate pain relief w prescribed pain regimine   asses qs and PRN. utilize prescribed analgesics    Skin   stg 2 wound to sacral area   maintain and improve  skin integrity while on rehab  asses Q shift and prn  wound care as needed     Rehab Goals Patient on target to meet rehab goals: Yes *See Care Plan and  progress notes for long and short-term goals.     Barriers to Discharge  Current Status/Progress Possible Resolutions Date Resolved   Physician    Medical stability        Continue daily management of medical issues.      Nursing                  PT                    OT Insurance for SNF coverage                SLP                SW                Discharge Planning/Teaching Needs:  Plan changed to SNF as daughter does not feel she can provided needed assistanc.e  NA   Team Discussion:  Started anti-dep last week - some brighter.  Stage 2 on bottom and still with occasional incont at night.  Supervision to min assist with ambulation.  Steady assist ADLs.  SW notes likely d/c end of week to SNF.  Revisions to Treatment Plan:  Change in plan to SNF.    Continued Need for Acute Rehabilitation Level of Care: The patient requires daily medical management by a physician with specialized training in physical medicine and rehabilitation for the following conditions: Daily direction of a multidisciplinary physical rehabilitation program to ensure safe treatment while eliciting the highest outcome that is of practical value to the patient.: Yes Daily medical management of patient stability for increased activity during participation in an intensive rehabilitation regime.: Yes Daily analysis of laboratory values and/or radiology reports with any subsequent need for medication adjustment of medical intervention for : Neurological problems;Mood/behavior problems  Sayf Kerner 03/06/2018, 1:42 PM

## 2018-03-06 NOTE — Progress Notes (Signed)
Physical Therapy Note  Patient Details  Name: Nicholas [name] FullingWillie J Rimel MRN: [id number] Date of Birth: 02/23/1951 Today's Date: 03/06/2018  1509-1609, 60  min individual tx Pain: none per pt  Pt doffed socks and donned shoes in sitting in w/c with min assist due to low vision.  Stand pivot w/c > NeStep with min assist, mulitmodal cues for technique.  Pt with shuffling steps as he moved.  neuromuscular re-education via forced use for alternating reciprocal movement x 4 extremities at level 4 x 6 minutes.   Valentina GuLucy, CSW arrived to state that pt would d/c tomorrow to [facility].  Remainder of tx consisted of simulated car transfer, gait on level terrain and steps and mulched ground.  Pt left resting in w/c with quick release belt applied, alarm set and all needs within reach.  See function navigator for current status.  Turhan Chill 03/06/2018, 12:33 PM

## 2018-03-06 NOTE — Plan of Care (Signed)
  Problem: Consults Goal: RH STROKE PATIENT EDUCATION Description See Patient Education module for education specifics  Outcome: Progressing   Problem: RH BLADDER ELIMINATION Goal: RH STG MANAGE BLADDER WITH ASSISTANCE Description STG Manage Bladder With Min Assistance  Outcome: Progressing   Problem: RH SKIN INTEGRITY Goal: RH STG SKIN FREE OF INFECTION/BREAKDOWN Description No new breakdown with min assist   Outcome: Progressing   Problem: RH SAFETY Goal: RH STG ADHERE TO SAFETY PRECAUTIONS W/ASSISTANCE/DEVICE Description STG Adhere to Safety Precautions With min Assistance/Device.  Outcome: Progressing   Problem: RH COGNITION-NURSING Goal: RH STG ANTICIPATES NEEDS/CALLS FOR ASSIST W/ASSIST/CUES Description STG Anticipates Needs/Calls for Assist With min Assistance/Cues.  Outcome: Progressing

## 2018-03-06 NOTE — Progress Notes (Signed)
Social Work Patient ID: Nicholas [name], male   DOB: [birth year], 67 y.o.   MRN: [id number]    Nicholas [name]  Social Worker  General Practice  Patient Care Conference  Signed  Date of Service:  03/06/2018  1:41 PM          Signed           [] Hide copied text  [] Hover for details   Inpatient RehabilitationTeam Conference and Plan of Care Update Date: 03/05/2018   Time: 2:40 PM      Patient Name: Nicholas [name]      Medical Record Number: [id number]  Date of Birth: Dec 03, 1951 Sex: Male         Room/Bed: 4W10C/4W10C-01 Payor Info: Payor: MEDICARE / Plan: MEDICARE PART A AND B / Product Type: *No Product type* /     Admitting Diagnosis: CVA  Admit Date/Time:  02/20/2018  5:47 PM Admission Comments: No comment available    Primary Diagnosis:  <principal problem not specified> Principal Problem: <principal problem not specified>       Patient Active Problem List    Diagnosis Date Noted  . Aortic stenosis 02/20/2018  . Occipital stroke (HCC) 02/20/2018  . Posterior cerebral artery embolism, left    . Right homonymous hemianopsia    . Gait disturbance, post-stroke    . Acquired left foot drop    . Urinary retention    . Diabetes mellitus type 2 in nonobese (HCC)    . Acute blood loss anemia    . Diastolic dysfunction    . Nonrheumatic aortic valve stenosis    . Elevated troponin 02/18/2018  . Stenosis of left carotid artery    . Carotid occlusion, right    . Diabetes mellitus (HCC)    . CVA (cerebral vascular accident) (HCC) 02/17/2018  . Pressure injury of skin 10/28/2017  . SBO (small bowel obstruction) (HCC) 10/17/2017  . Bacterial UTI 03/19/2017  . Adjustment disorder    . Slow transit constipation    . Weakness of both lower extremities    . Bilateral foot-drop    . Lumbar radiculopathy 03/13/2017  . Lumbar spinal stenosis 03/13/2017  . Foot drop, right    . Urinary retention due to benign prostatic hyperplasia    . Benign essential HTN    .  History of CVA (cerebrovascular accident) 03/11/2017  . Cognitive deficits, late effect of cerebrovascular disease 07/23/2014  . 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 PSVT (paroxysmal supraventricular tachycardia) (HCC) 09/15/2010  . Aortic valve disorder 08/09/2010  . PVD (peripheral vascular disease) (HCC) 08/09/2010  . Hyperlipidemia 08/08/2010  . TOBACCO ABUSE 08/08/2010  . Essential hypertension 08/08/2010  . CAD, NATIVE VESSEL 08/08/2010  . Occlusion and stenosis of carotid artery 08/08/2010      Expected Discharge Date: Expected Discharge Date: (SNF)   Team Members Present: Physician leading conference: Dr. Faith Rogue Social Worker Present: Nicholas Jupiter, LCSW Nurse Present: Nicholas Portela, RN PT Present: Nicholas [name], PT OT Present: Nicholas [name], OT SLP Present: Nicholas [name], SLP PPS Coordinator present : Nicholas Duck, RN, CRRN       Current Status/Progress Goal Weekly Team Focus  Medical     Patient with ongoing right sided field cut.  Aware of deficits however.  Dissipating in therapy.  Mood seems improved with Lexapro  Ongoing management of blood pressure and cardiovascular parameters.  Mood, cv mgt as noted in chart   Bowel/Bladder     cont of B&B  W occ incont overnight.  Mantain continency throughtout  Asses and offer urinal q 2 hrs and Prn   Swallow/Nutrition/ Hydration               ADL's     Steady assist overall using RW at ambulatory level. Still challenged by blurred vision/field cut. Exhibits depressive symptoms.   Supervision overall  ADL retraining, balance, vision strategies, functional transfers, cognitive remediation   Mobility     close supervision gait and transfers, mod cuing due to visual deficits  supervision overall  awareness, functional mobility   Communication               Safety/Cognition/ Behavioral Observations   Max  Min-Mod A  attention, problem solving, visual scanning    Pain     co pain in buttocks DT  Bed and Stg 2 wound   maintain adequate pain relief w prescribed pain regimine   asses qs and PRN. utilize prescribed analgesics    Skin     stg 2 wound to sacral area   maintain and improve  skin integrity while on rehab  asses Q shift and prn  wound care as needed      Rehab Goals Patient on target to meet rehab goals: Yes *See Care Plan and progress notes for long and short-term goals.      Barriers to Discharge   Current Status/Progress Possible Resolutions Date Resolved   Physician     Medical stability        Continue daily management of medical issues.      Nursing                 PT                    OT Insurance for SNF coverage               SLP            SW              Discharge Planning/Teaching Needs:  Plan changed to SNF as daughter does not feel she can provided needed assistanc.e  NA   Team Discussion:  Started anti-dep last week - some brighter.  Stage 2 on bottom and still with occasional incont at night.  Supervision to min assist with ambulation.  Steady assist ADLs.  SW notes likely d/c end of week to SNF.  Revisions to Treatment Plan:  Change in plan to SNF.    Continued Need for Acute Rehabilitation Level of Care: The patient requires daily medical management by a physician with specialized training in physical medicine and rehabilitation for the following conditions: Daily direction of a multidisciplinary physical rehabilitation program to ensure safe treatment while eliciting the highest outcome that is of practical value to the patient.: Yes Daily medical management of patient stability for increased activity during participation in an intensive rehabilitation regime.: Yes Daily analysis of laboratory values and/or radiology reports with any subsequent need for medication adjustment of medical intervention for : Neurological problems;Mood/behavior problems   Nicholas [name] 03/06/2018, 1:42 PM                  Nicholas Pancoast, LCSW  Social  Worker  General Practice  Patient Care Conference  Signed  Date of Service:  02/26/2018 12:11 PM          Signed          [] Hide copied text  []   Hover for details   Inpatient RehabilitationTeam Conference and Plan of Care Update Date: 02/25/2018   Time: 2:40 PM      Patient Name: Nicholas [name]      Medical Record Number: [id number]  Date of Birth: [birth year] Sex: Male         Room/Bed: 4W10C/4W10C-01 Payor Info: Payor: MEDICARE / Plan: MEDICARE PART A AND B / Product Type: *No Product type* /     Admitting Diagnosis: CVA  Admit Date/Time:  02/20/2018  5:47 PM Admission Comments: No comment available    Primary Diagnosis:  <principal problem not specified> Principal Problem: <principal problem not specified>       Patient Active Problem List    Diagnosis Date Noted  . Aortic stenosis 02/20/2018  . Occipital stroke (HCC) 02/20/2018  . Posterior cerebral artery embolism, left    . Right homonymous hemianopsia    . Gait disturbance, post-stroke    . Acquired left foot drop    . Urinary retention    . Diabetes mellitus type 2 in nonobese (HCC)    . Acute blood loss anemia    . Diastolic dysfunction    . Nonrheumatic aortic valve stenosis    . Elevated troponin 02/18/2018  . Stenosis of left carotid artery    . Carotid occlusion, right    . Diabetes mellitus (HCC)    . CVA (cerebral vascular accident) (HCC) 02/17/2018  . Pressure injury of skin 10/28/2017  . SBO (small bowel obstruction) (HCC) 10/17/2017  . Bacterial UTI 03/19/2017  . Adjustment disorder    . Slow transit constipation    . Weakness of both lower extremities    . Bilateral foot-drop    . Lumbar radiculopathy 03/13/2017  . Lumbar spinal stenosis 03/13/2017  . Foot drop, right    . Urinary retention due to benign prostatic hyperplasia    . Benign essential HTN    . History of CVA (cerebrovascular accident) 03/11/2017  . Cognitive deficits, late effect of cerebrovascular disease 07/23/2014    . 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 PSVT (paroxysmal supraventricular tachycardia) (HCC) 09/15/2010  . Aortic valve disorder 08/09/2010  . PVD (peripheral vascular disease) (HCC) 08/09/2010  . Hyperlipidemia 08/08/2010  . TOBACCO ABUSE 08/08/2010  . Essential hypertension 08/08/2010  . CAD, NATIVE VESSEL 08/08/2010  . Occlusion and stenosis of carotid artery 08/08/2010      Expected Discharge Date: Expected Discharge Date: 03/08/18   Team Members Present: Physician leading conference: Dr. Faith Rogue Social Worker Present: Nicholas Jupiter, LCSW Nurse Present: Allayne Stack, RN PT Present: Nicholas [name], PT OT Present: Nicholas [name], OT SLP Present: Nicholas [name], SLP PPS Coordinator present : Nicholas Duck, RN, CRRN       Current Status/Progress Goal Weekly Team Focus  Medical     Occipital stroke with right homonymous hemianopsia as well as associated cognitive deficits.  Suspect reactive depression.  Severe aortic stenosis  Optimizing nutrition and sleep.  Intermittent urinary incontinence with history of urine retention.  Checking postvoid residuals and urine sample   Bowel/Bladder     reported Continency during day, incontinent during HS    Mantain continency throughtout  Assess and offer urinal Q2hrs and prn,   Swallow/Nutrition/ Hydration               ADL's     min A overall with functional transfers and self care- mod cues secondary to visual cut  Supervision overall  ADL retraining, balnce, functional transfers/mobility, cognition, vision  strategies   Mobility     min A with gait, mod/max cuing due to visual cut, attention and awareness  supervision overall  functional activities, gait, awareness   Communication               Safety/Cognition/ Behavioral Observations   Max A  Min A   orientation, attention, visual scanning/attention, problem solving    Pain     denies   < 2  Assess QS and prn, provide medication as prdered and reassess  outcomes    Skin     no skin issues  maintain skin integrity while on rehab  Assess QS and prn report changes and provived adequate treament and followup     Rehab Goals Patient on target to meet rehab goals: Yes *See Care Plan and progress notes for long and short-term goals.      Barriers to Discharge   Current Status/Progress Possible Resolutions Date Resolved   Physician     Medical stability        Treat medical conditions above      Nursing                 PT                    OT                 SLP            SW              Discharge Planning/Teaching Needs:  Pt plans to d/c to daughter's home.  She and her spouse work alternate shifts and can provide "close to" 24/7 support.    Teaching needs TBD closer to d/c.   Team Discussion:  Pt continues to present very depressed;  Significant visual/ spatial deficits.  Tx report that if not doing a functional task then he can "shut down" his participation.  Currently min assist with rw and MAX cueing.  Will need 24/7 assistance and may still have some incontinence at d/c.  SW to follow up with family about whether they feel they can meet these care needs for pt.  Revisions to Treatment Plan:  None    Continued Need for Acute Rehabilitation Level of Care: The patient requires daily medical management by a physician with specialized training in physical medicine and rehabilitation for the following conditions: Daily direction of a multidisciplinary physical rehabilitation program to ensure safe treatment while eliciting the highest outcome that is of practical value to the patient.: Yes Daily medical management of patient stability for increased activity during participation in an intensive rehabilitation regime.: Yes Daily analysis of laboratory values and/or radiology reports with any subsequent need for medication adjustment of medical intervention for : Urological problems    [place] 02/26/2018, 12:11 PM

## 2018-03-06 NOTE — Progress Notes (Signed)
Occupational Therapy Session Note  Patient Details  Name: Erin FullingWillie J Pickerel MRN: [id number] Date of Birth: 10/24/1951  Today's Date: 03/06/2018 OT Individual Time: 1400-1430 OT Individual Time Calculation (min): 30 min    Short Term Goals: Week 2:  OT Short Term Goal 1 (Week 2): STGs=LTGs due to ELOS  Skilled Therapeutic Interventions/Progress Updates:    OT treatment session focused on UB there-ex. Pt greeted sitting in wc and agreeable to OT. B UE strength/coordination with wc propulsion to dayroom. Min A stand-pivot to chair. Pt completed 12 mins on sci Fit arm bike on level 8 with RPMs above 25. Stand-pivot back to wc in simular fashion. Returned pt to room and pt left seated in wc with chair alarm and safety belt on.   Therapy Documentation Precautions:  Precautions Precautions: Fall(Simultaneous filing. User may not have seen previous data.) Precaution Comments: Rt visual field cut(Simultaneous filing. User may not have seen previous data.) Restrictions Weight Bearing Restrictions: No Pain: Pain Assessment Pain Score: 0-No pain Faces Pain Scale: No hurt ADL: ADL ADL Comments: Please see functional navigator for ADL status  See Function Navigator for Current Functional Status.   Therapy/Group: Individual Therapy  Mal Amabilelisabeth S Benancio Osmundson 03/06/2018, 2:16 PM

## 2018-03-06 NOTE — Progress Notes (Signed)
[facility]IC[facility]ABILITATION     PROGRESS NOTE    Subjective/Complaints: In reasonable spirits this morning. Eating breakfast.   ROS: Patient denies fever, rash, sore throat, blurred vision, nausea, vomiting, diarrhea, cough, shortness of breath or chest pain, joint or back pain, headache, or mood change.   Objective: Vital Signs: Blood pressure (!) 151/58, pulse 78, temperature 98.6 F (37 C), temperature source Oral, resp. rate 16, height 5\' 11"  (1.803 m), weight 75.1 kg (165 lb 9.1 oz), SpO2 95 %. No results found. No results for input(s): WBC, HGB, HCT, PLT in the last 72 hours. No results for input(s): NA, K, CL, GLUCOSE, BUN, CREATININE, CALCIUM in the last 72 hours.  Invalid input(s): CO CBG (last 3)  No results for input(s): GLUCAP in the last 72 hours.  Wt Readings from Last 3 Encounters:  03/05/18 75.1 kg (165 lb 9.1 oz)  02/18/18 78.3 kg (172 lb 9.9 oz)  10/17/17 85.5 kg (188 lb 7.9 oz)    Physical Exam:  Constitutional: No distress . Vital signs reviewed. HEENT: EOMI, oral membranes moist Cardiovascular: RRR without murmur. No JVD    Respiratory: CTA Bilaterally without wheezes or rales. Normal effort    GI: BS +, non-tender, non-distended   Skin. Neck scar Musculoskeletal: No edema or tenderness in extremities Neurological: He isalert. Impaired insight and awareness dense right homonymous hemianopsia essentially stable but patient seems to be more aware and scanning to the right side more effectively. Motor: 4+/5 grossly throughout---except 0/5left ankle dorsiflexors-no changes Senses LT and pain in all 4's.  Psych: Notably more positive and upbeat today.   Assessment/Plan: 1. Decreased functional mobility and visual-spatial deficits secondary to left temporal-occipital infarct which require 3+ hours per day of interdisciplinary therapy in a comprehensive inpatient rehab setting. Physiatrist is providing close team supervision and 24  hour management of active medical problems listed below. Physiatrist and rehab team continue to assess barriers to discharge/monitor patient progress toward functional and medical goals.  Function:  Bathing Bathing position Bathing activity did not occur: N/A Position: Shower  Bathing parts Body parts bathed by patient: Right arm, Left arm, Chest, Abdomen, Front perineal area, Right upper leg, Left upper leg, Right lower leg, Left lower leg, Buttocks Body parts bathed by helper: Back  Bathing assist Assist Level: Supervision or verbal cues      Upper Body Dressing/Undressing Upper body dressing   What is the patient wearing?: Pull over shirt/dress     Pull over shirt/dress - Perfomed by patient: Thread/unthread right sleeve, Thread/unthread left sleeve, Put head through opening, Pull shirt over trunk          Upper body assist Assist Level: Supervision or verbal cues, Set up   Set up : To obtain clothing/put away  Lower Body Dressing/Undressing Lower body dressing   What is the patient wearing?: Pants, Non-skid slipper socks, Underwear Underwear - Performed by patient: Pull underwear up/down, Thread/unthread right underwear leg, Thread/unthread left underwear leg   Pants- Performed by patient: Thread/unthread right pants leg, Thread/unthread left pants leg, Pull pants up/down   Non-skid slipper socks- Performed by patient: Don/doff right sock, Don/doff left sock Non-skid slipper socks- Performed by helper: Don/doff right sock, Don/doff left sock                  Lower body assist Assist for lower body dressing: Touching or steadying assistance (Pt > 75%)      Toileting Toileting Toileting activity did not occur: No continent bowel/bladder  event Toileting steps completed by patient: Adjust clothing prior to toileting, Adjust clothing after toileting Toileting steps completed by helper: Adjust clothing prior to toileting, Adjust clothing after toileting Toileting  Assistive Devices: Grab bar or rail, Other (comment)(RW)  Toileting assist Assist level: Supervision or verbal cues   Transfers Chair/bed transfer   Chair/bed transfer method: Ambulatory Chair/bed transfer assist level: Touching or steadying assistance (Pt > 75%) Chair/bed transfer assistive device: Armrests, Patent attorneyWalker     Locomotion Ambulation     Max distance: 150 ft Assist level: Supervision or verbal cues   Wheelchair   Type: Manual Max wheelchair distance: 8020ft Assist Level: Touching or steadying assistance (Pt > 75%)  Cognition Comprehension Comprehension assist level: Understands basic 90% of the time/cues < 10% of the time  Expression Expression assist level: Expresses basic 90% of the time/requires cueing < 10% of the time.  Social Interaction Social Interaction assist level: Interacts appropriately 50 - 74% of the time - May be physically or verbally inappropriate.  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   -continue PT, OT, SLP   -gradual gains 2. DVT Prophylaxis/Anticoagulation: SCDs. Monitor for any signs of DVT 3. Pain Management:Tylenol as needed 4. Mood/reactive depression:team providing emotional support as possible.     -increased lexapro to 10mg  qhs with notable improvements 5. Neuropsych: This patientiscapable of making decisions on hisown behalf. 6. Skin/Wound Care:Routine skin checks 7.  Fluids/Electrolytes/Nutrition:encourage PO   -good appetite.   8.Supraventricular tachycardia with history of CAD/PCI and noted findings of aortic stenosis. Current plan to follow up outpatient with cardiology services.  Began Eliquis 5 mg twice daily on 3/17 with decreased aspirin to 81 mg.  Now off plavix.      -schedule for 30 day cardiac event monitor (apparnetly appt today) 9.Diet controlled diabetes mellitus. Hemoglobin A1c 4.8. Currently with SSI.   -sugars under tight control.    -Stopped CBG checks.  On regular diet 10.History of urinary retention.  Intermittent urinary incontinence.    - ua/ucx reviewed and are negative 11.Hyperlipidemia. Crestor 12.Constipation. Laxative assistance   -Changed Senokot-S to 2 tablets scheduled at bedtime    -+BM 3/27---understands importance of regular bm   LOS (Days) 14 A FACE TO FACE EVALUATION WAS PERFORMED  Ranelle OysterZachary T Yamil Oelke, MD 03/06/2018 11:52 AM

## 2018-03-06 NOTE — Progress Notes (Signed)
Social Work Patient ID: Nicholas FullingWillie J [name], male   DOB: 01/26/1951, 67 y.o.   MRN: [id number]   [facility].  Pt and family are accepting this offer. Will alert tx team.  [name], Karma Ansley, LCSW

## 2018-03-06 NOTE — Progress Notes (Signed)
Patient ID: Nicholas [name], male   DOB: 07/26/1951, 67 y.o.   MRN: [id number] Patient was not transported for his 03/06/18, 9:30 AM appointment to have a 30 day cardiac event monitor applied.

## 2018-03-06 NOTE — Progress Notes (Signed)
I think pt still hospitalized for inpt rehab. Could you please contact him after his discharge? Thanks much.   Marvel PlanJindong Alyas Creary, MD PhD Stroke Neurology 03/06/2018 10:14 AM

## 2018-03-06 NOTE — Discharge Summary (Signed)
Physician Discharge Summary  Patient ID: Nicholas [name] MRN: [id number] DOB/AGE: 68/07/1951 67 y.o.  Admit date: 02/20/2018 Discharge date: 03/07/2018  Discharge Diagnoses:  Principal Problem:   Occipital stroke [facility](HCC) Active Problems:   Posterior cerebral artery embolism, left   Right homonymous hemianopsia   Gait disturbance, post-stroke   Acquired left foot drop   Discharged Condition: stable   Significant Diagnostic Studies: N/A   Labs:  Basic Metabolic Panel: BMP Latest Ref Rng & Units 02/21/2018 02/20/2018 02/18/2018  Glucose 65 - 99 mg/dL 77 81 76  BUN 6 - 20 mg/dL 15 11 9   Creatinine 0.61 - 1.24 mg/dL 7.821.05 9.561.03 2.130.86  Sodium 135 - 145 mmol/L 141 137 138  Potassium 3.5 - 5.1 mmol/L 4.1 3.8 3.7  Chloride 101 - 111 mmol/L 104 101 103  CO2 22 - 32 mmol/L 30 29 28   Calcium 8.9 - 10.3 mg/dL 9.0 0.8(M8.7(L) 9.0    CBC: CBC Latest Ref Rng & Units 02/21/2018 02/20/2018 02/17/2018  WBC 4.0 - 10.5 K/uL 9.5 13.4(H) 7.5  Hemoglobin 13.0 - 17.0 g/dL 11.6(L) 12.0(L) 12.4(L)  Hematocrit 39.0 - 52.0 % 33.9(L) 34.9(L) 36.4(L)  Platelets 150 - 400 K/uL 273 265 285    CBG: Recent Labs  Lab 03/01/18 0733  GLUCAP 65    Brief HPI:   Nicholas [name] is a 67 y.o. RH male with history of CVA, concussion, back injury with left foot drop, SVT, CAD/PCI maintained on Plavix, T2DM who was admitted on 02/17/2018 with altered mental status as well as blurred vision. MRI brain revealed acute large nonhemorrhagic temporal occipital lobe PCA territory infarcts,small acute posterior circulation and posterior watershed infarcts and old bifrontal and biparietal lobe infarction. TEE revealed severe aortic stenosis  with recommendations to follow up with Dr. Excell Seltzerooper to discuss further care after he recovers from CVA. 30 day event monitor recommended after discharge. Neurology recommended aspirin and Plavix for CVA prophylaxis and to Eliquis no sooner than 02/23/2018. Therapy ongoing and CIR recommended due to  functional deficits.      Hospital Course: Nicholas [name] was admitted to rehab 02/20/2018 for inpatient therapies to consist of PT, ST and OT at least three hours five days a week. Past admission physiatrist, therapy team and rehab RN have worked together to provide customized collaborative inpatient rehab. Blood pressures have been stable. He was transitioned to Eliquis 5 mg bid on 3/17 and ASA was decreased to 81 mg daily. He is tolerating this without side effects. Po intake has been fair and nutritional supplements were offered between meals. Blood sugars were checked briefly and were well controlled therefore CBG checks were discontinued. Bowel program was augmented to help manage constipation. He has had intermittent issue with bladder incontinence and UA/UCS was checked and was negative for infection. Follow up labs showed that renal status is stable and leucocytosis has resolved. He continues to be limited by visual deficits and anxiety. He requires close supervision to min assist and has elected on SNF for follow up therapy. [facility] 03/07/18.     Rehab course: During patient's stay in rehab weekly team conferences were held to monitor patient's progress, set goals and discuss barriers to discharge. At admission, patient required mod assist with mobility and min to mod assist for ADL tasks. He exhibited moderate to severe cognitive deficits impacting functional and familiar tasks with MoCA score 11/30. He needed max cues for motivation and attention. He has had improvement in activity tolerance, balance,  postural control as well as ability to compensate for deficits. He requires min assist with bathing and dressing tasks.  He requires supervision for transfers and to ambulate 150' with RW.    Disposition: Skilled Nursing facility.   Diet: Heart Healthy.   Special Instructions: 1. Needs to follow up with cardiology for 30 day event monitor.   2. Needs CBC  rechecked in 5-7 days.   Allergies as of 03/07/2018   No Known Allergies     Medication List    STOP taking these medications   clopidogrel 75 MG tablet Commonly known as:  PLAVIX   diltiazem 360 MG 24 hr capsule Commonly known as:  TAZTIA XT   doxazosin 2 MG tablet Commonly known as:  CARDURA   metoprolol tartrate 50 MG tablet Commonly known as:  LOPRESSOR     TAKE these medications   acetaminophen 325 MG tablet Commonly known as:  TYLENOL Take 2 tablets (650 mg total) by mouth every 4 (four) hours as needed for mild pain (or temp > 37.5 C (99.5 F)).   apixaban 5 MG Tabs tablet Commonly known as:  ELIQUIS Take 1 tablet (5 mg total) by mouth 2 (two) times daily.   aspirin 81 MG EC tablet Take 1 tablet (81 mg total) by mouth daily. What changed:    medication strength  how much to take  additional instructions   escitalopram 10 MG tablet Commonly known as:  LEXAPRO Take 1 tablet (10 mg total) by mouth at bedtime.   rosuvastatin 10 MG tablet Commonly known as:  CRESTOR Take 1 tablet (10 mg total) by mouth daily.   senna-docusate 8.6-50 MG tablet Commonly known as:  Senokot-S Take 2 tablets by mouth at bedtime. What changed:    how much to take  when to take this  reasons to take this       Contact information for follow-up providers    Ranelle Oyster, MD Follow up.   Specialty:  Physical Medicine and Rehabilitation Why:  office to call for appointment Contact information: [address] [place] [address] [phone]        Marvel Plan, MD Follow up.   Specialty:  Neurology Why:  call for appointment 6 weeks Contact information: [address] [place] [address] [phone]        Jake Bathe, MD. Call.   Specialty:  Cardiology Why:  call for appointment and for placement of 30 day cardiac monitor.  Contact information: [address] [place] [address] [phone]             Contact information for after-discharge care    Destination    [facility] .   Service:  Skilled Nursing Contact information: [address] [place] [address] [phone]                  Signed: Jacquelynn Cree 03/07/2018, 1:06 PM

## 2018-03-06 NOTE — Progress Notes (Signed)
Occupational Therapy Session Note  Patient Details  Name: Nicholas [name] MRN: [id number] Date of Birth: 03-Apr-1951  Today's Date: 03/06/2018 OT Individual Time: 1100-1154 OT Individual Time Calculation (min): 54 min    Short Term Goals: Week 2:  OT Short Term Goal 1 (Week 2): STGs=LTGs due to ELOS  Skilled Therapeutic Interventions/Progress Updates:    Pt received supine in bed, with no c/o pain and agreeable to therapy. Pt completed bed mobility with (S) and vc provided for sequencing. Pt stand pivot-ed to w/c with CGA. Pt transferred to shower bench with CGA, requiring tactile and vc for use of grab bars with B UE. Pt was able to doff UB/LE clothing with min A for removing distally. Pt then completed UB/LB bathing at seated level, requiring intermittent vc for thoroughness. Pt stood in shower with use of anterior grab bars and completed posterior/anterior peri-care, requiring vc for termination of task. Vc provided for scanning of environment to locate soap. Pt then sat on the edge of the shower bench and donned LB clothing, requiring min A to complete threading of pants/underwear, getting stuck on his feet, but standing with SBA and pulling up pants. Pt donned shirt while seated, requiring vc for problem solving through getting arm/head hole mixed up. Pt then transferred back to w/c, and requested to use urinal in standing, requiring only SBA to complete all steps. Pt completed oral hygiene with set up seated at sink. Pt educated re pressure relief positioning to reduce sacral wound as reported by nursing. Pt left sitting up in w.c with chair alarm activated, quick release belt donned, and all needs met.   Therapy Documentation Precautions:  Precautions Precautions: Fall(Simultaneous filing. User may not have seen previous data.) Precaution Comments: Rt visual field cut(Simultaneous filing. User may not have seen previous data.) Restrictions Weight Bearing Restrictions: No Pain: Pain  Assessment Pain Scale: 0-10 Pain Score: 0-No pain Faces Pain Scale: No hurt ADL: ADL ADL Comments: Please see functional navigator for ADL status  See Function Navigator for Current Functional Status.   Therapy/Group: Individual Therapy  Curtis Sites 03/06/2018, 11:54 AM

## 2018-03-06 NOTE — Progress Notes (Signed)
Physical Therapy Discharge Summary  Patient Details  Name: Nicholas [name] MRN: [id number] Date of Birth: September 30, 1951  Patient has met 7 of 7 long term goals due to improved activity tolerance, improved balance, improved postural control, increased strength, ability to compensate for deficits, functional use of  right upper extremity, right lower extremity, left upper extremity and left lower extremity and improved awareness.  Patient to discharge at an ambulatory level, close Supervision, due to low vision and fear.  Patient's care partner unavailable to provide the necessary cognitive assistance at discharge.  Reasons goals not met: n/a  Recommendation:  Patient will benefit from ongoing skilled PT services in skilled nursing facility setting to continue to advance safe functional mobility, address ongoing impairments in vision, balance, gait, motor control, and minimize fall risk.  Equipment: No equipment provided  Reasons for discharge: treatment goals met and discharge from hospital  Patient/family agrees with progress made and goals achieved: Yes  PT Discharge Precautions/Restrictions Precautions Precautions: Fall Precaution Comments: significantly low vision, including color discrimination Restrictions Weight Bearing Restrictions: No Vital Signs Therapy Vitals Pulse Rate: 73 after ex Pain Pain Assessment Pain Score: 0-No pain Vision/Perception  Praxis Praxis: Impaired Praxis Impairment Details: Initiation;Motor planning  Cognition Overall Cognitive Status: History of cognitive impairments - at baseline Arousal/Alertness: Awake/alert Orientation Level: Oriented to person;Oriented to place;Oriented to situation Attention: Sustained Sustained Attention: Impaired Memory: Impaired Sensation Sensation Light Touch: Appears Intact Proprioception: Appears Intact Coordination Gross Motor Movements are Fluid and Coordinated: No Fine Motor Movements are Fluid and  Coordinated: No Motor  Motor Motor: Abnormal postural alignment and control  Mobility Bed Mobility Bed Mobility: (modified independent for all) Transfers Transfers: Yes Sit to Stand: 5: Supervision Sit to Stand Details: Verbal cues for technique Sit to Stand Details (indicate cue type and reason): low vision Stand to Sit: 5: Supervision Stand Pivot Transfers: 5: Supervision Stand Pivot Transfer Details (indicate cue type and reason): due to low vision, pt needs VCs and tactile cues at times to locate surface he is moving to Locomotion  Ambulation Ambulation: Yes Ambulation/Gait Assistance: 5: Supervision Ambulation Distance (Feet): 150 Feet Assistive device: Rolling walker Ambulation/Gait Assistance Details: Verbal cues for technique Ambulation/Gait Assistance Details: low vision Gait Gait: Yes Gait Pattern: Impaired Gait Pattern: Lateral trunk lean to right;Festinating;Abducted - left;Step-through pattern;Decreased step length - right;Decreased step length - left High Level Ambulation High Level Ambulation: Backwards walking Backwards Walking: min assist Stairs / Additional Locomotion Stairs: Yes Stairs Assistance: 4: Min guard Number of Stairs: 12 Height of Stairs: 6(and 3) Ramp: 3: Mod assist Wheelchair Mobility Wheelchair Mobility: No  Trunk/Postural Assessment  Cervical Assessment Cervical Assessment: Within Functional Limits Thoracic Assessment Thoracic Assessment: Exceptions to WFL(rounded shoulders) Lumbar Assessment Lumbar Assessment: Within Functional Limits Postural Control Postural Control: Deficits on evaluation Righting Reactions: delayed and inadequate Postural Limitations: R trunk lean  Balance Balance Balance Assessed: Yes Dynamic Sitting Balance Dynamic Sitting - Level of Assistance: 5: Stand by assistance Static Standing Balance Static Standing - Comment/# of Minutes: supervision with 1UE support Dynamic Standing Balance Dynamic Standing -  Level of Assistance: 5: Stand by assistance(with 1 UE support) Extremity Assessment      RLE Assessment RLE Assessment: Not tested(NT due to time constraints; functional for gait wiht RW) LLE Assessment LLE Assessment: Not tested(NT due to time constraints; sufficient for gait with Rw)   See Function Navigator for Current Functional Status.  Nicholas [name] 03/06/2018, 4:27 PM

## 2018-03-07 ENCOUNTER — Inpatient Hospital Stay (HOSPITAL_COMMUNITY): Payer: Medicare Other | Admitting: Occupational Therapy

## 2018-03-07 ENCOUNTER — Inpatient Hospital Stay (HOSPITAL_COMMUNITY): Payer: Medicare Other | Admitting: Speech Pathology

## 2018-03-07 ENCOUNTER — Inpatient Hospital Stay (HOSPITAL_COMMUNITY): Payer: Medicare Other | Admitting: Physical Therapy

## 2018-03-07 MED ORDER — ACETAMINOPHEN 325 MG PO TABS
650.0000 mg | ORAL_TABLET | ORAL | Status: AC | PRN
Start: 2018-03-07 — End: ?

## 2018-03-07 MED ORDER — SENNOSIDES-DOCUSATE SODIUM 8.6-50 MG PO TABS: 2.0000 | ORAL_TABLET | Freq: Every day | ORAL | Status: AC

## 2018-03-07 MED ORDER — ESCITALOPRAM OXALATE 10 MG PO TABS: 10.0000 mg | ORAL_TABLET | Freq: Every day | ORAL | Status: AC

## 2018-03-07 MED ORDER — ASPIRIN 81 MG PO TBEC: 81.0000 mg | DELAYED_RELEASE_TABLET | Freq: Every day | ORAL | Status: AC

## 2018-03-07 NOTE — Progress Notes (Signed)
Speech Language Pathology Daily Session and Discharge Summary  Patient Details  Name: Nicholas [name] MRN: [id number] Date of Birth: 1951/01/03  Today's Date: 03/07/2018 SLP Individual Time: 0730-0830 SLP Individual Time Calculation (min): 60 min   Skilled Therapeutic Interventions: Skilled treatment session focused on cognitive goals. Upon arrival, pt was consuming breakfast in wheelchair. SLP facilitated session by providing Min A verbal cues for problem solving during tray set-up and self-feeding. Pt required Mod A verbal and tactile cues for visual scanning and attend to right field of environment. Pt demonstrated recall of new information with Total A verbal cues throughout session. Pt left upright in wheelchair with alarm on, quick release belt on, and all needs within reach. Continue with current plan of care.    Patient has met 3 of 4 long term goals.  Patient to discharge at overall Mod A level.   Reasons goals not met: Pt continues to require Total A verbal cues for recall of events.    Clinical Impression/Discharge Summary: Pt has made functional gains and has met 3 out of 4 LTG's this admission due to improved cognitive functioning. Currently, pt requires overall Min-Mod A verbal cues for basic problem solving and Min A verbal cues for sustained attention for ~30 minutes. Pt also able to identify his physical and cognitive deficits with Min A verbal cues. However, pt continues to require Total A verbal cues to recall new information. Pt's family is unable to provide the necessary assistance needed at this time, therefore, pt will discharge to SNF. Pt would benefit from f/u SLP services to maximize cognitive function in order to improve his overall functional independence and reduce caregiver burden.      Care Partner:  Caregiver Able to Provide Assistance: No  Type of Caregiver Assistance: Physical;Cognitive  Recommendation:  Skilled Nursing facility;24 hour  supervision/assistance  Rationale for SLP Follow Up: Maximize cognitive function and independence;Reduce caregiver burden   Equipment: N/A   Reasons for discharge: Discharged from hospital   Patient/Family Agrees with Progress Made and Goals Achieved: Yes   Function:  Cognition Comprehension Comprehension assist level: Follows basic conversation/direction with extra time/assistive device  Expression   Expression assist level: Expresses basic needs/ideas: With extra time/assistive device  Social Interaction Social Interaction assist level: Interacts appropriately 50 - 74% of the time - May be physically or verbally inappropriate.  Problem Solving Problem solving assist level: Solves basic 75 - 89% of the time/requires cueing 10 - 24% of the time  Memory Memory assist level: Recognizes or recalls less than 25% of the time/requires cueing greater than 75% of the time   Meredeth Ide  SLP - Student 03/07/2018, 3:12 PM

## 2018-03-07 NOTE — Progress Notes (Signed)
Report called  and given to Gustavo Lahindy Stokes [facility]. All questions answered. Pts belongings packed. Ronnette JuniperKatlynn E Lashala Laser, LPN

## 2018-03-07 NOTE — Progress Notes (Signed)
Social Work  Discharge Note  The overall goal for the admission was met for:   Discharge location: No - plan changed to SNF as family unable to provide 24/7 support.  Length of Stay: Yes - 15 days  Discharge activity level: Yes - supervision to min assist overall  Home/community participation: No  Services provided included: MD, RD, PT, OT, SLP, RN, TR, Pharmacy and SW  Financial Services: Medicare  Follow-up services arranged: Other: SNF at [facility]  Comments (or additional information):  Patient/Family verbalized understanding of follow-up arrangements: Yes  Individual responsible for coordination of the follow-up plan: pt/ daughter  Confirmed correct DME delivered: NA    Claiborne Stroble

## 2018-03-07 NOTE — Progress Notes (Signed)
Pt left unit via EMS to transport to SNF, no s/s distress at this time. Belongings in tow. Ronnette JuniperKatlynn E Aribelle Mccosh, LPN

## 2018-03-07 NOTE — Plan of Care (Signed)
  Problem: Consults Goal: RH STROKE PATIENT EDUCATION Description See Patient Education module for education specifics  Outcome: Progressing   Problem: RH BLADDER ELIMINATION Goal: RH STG MANAGE BLADDER WITH ASSISTANCE Description STG Manage Bladder With Min Assistance  Outcome: Progressing   Problem: RH SKIN INTEGRITY Goal: RH STG SKIN FREE OF INFECTION/BREAKDOWN Description No new breakdown with min assist   Outcome: Progressing   Problem: RH SAFETY Goal: RH STG ADHERE TO SAFETY PRECAUTIONS W/ASSISTANCE/DEVICE Description STG Adhere to Safety Precautions With min Assistance/Device.  Outcome: Progressing   Problem: RH COGNITION-NURSING Goal: RH STG ANTICIPATES NEEDS/CALLS FOR ASSIST W/ASSIST/CUES Description STG Anticipates Needs/Calls for Assist With min Assistance/Cues.  Outcome: Progressing   

## 2018-03-07 NOTE — Plan of Care (Signed)
10/10 LTGs achieved 03/07/18

## 2018-03-07 NOTE — Progress Notes (Signed)
[facility]     PROGRESS NOTE    Subjective/Complaints: Head down eating breakfast. Denies any new issues. Knows he's going somewhere else today  ROS: Limited due to cognitive/behavioral   Objective: Vital Signs: Blood pressure 120/77, pulse 77, temperature 99 F (37.2 C), temperature source Oral, resp. rate 17, height 5\' 11"  (1.803 m), weight 75.1 kg (165 lb 9.1 oz), SpO2 99 %. No results found. No results for input(s): WBC, HGB, HCT, PLT in the last 72 hours. No results for input(s): NA, K, CL, GLUCOSE, BUN, CREATININE, CALCIUM in the last 72 hours.  Invalid input(s): CO CBG (last 3)  No results for input(s): GLUCAP in the last 72 hours.  Wt Readings from Last 3 Encounters:  03/05/18 75.1 kg (165 lb 9.1 oz)  02/18/18 78.3 kg (172 lb 9.9 oz)  10/17/17 85.5 kg (188 lb 7.9 oz)    Physical Exam:  Constitutional: No distress . Vital signs reviewed. HEENT: EOMI, oral membranes moist Cardiovascular: RRR without murmur. No JVD    Respiratory: CTA Bilaterally without wheezes or rales. Normal effort    GI: BS +, non-tender, non-distended  Skin. Neck scar Musculoskeletal: No edema or tenderness in extremities Neurological: He isalert. Impaired insight and awareness dense right homonymous hemianopsia essentially stable but patient seems to be more aware and scanning to the right side more effectively. Motor: 4+/5 grossly throughout---except 0/5left ankle dorsiflexors-stable Senses LT and pain in all 4's.  Psych: flat and difficult to engage today   Assessment/Plan: 1. Decreased functional mobility and visual-spatial deficits secondary to left temporal-occipital infarct which require 3+ hours per day of interdisciplinary therapy in a comprehensive inpatient rehab setting. Physiatrist is providing close team supervision and 24 hour management of active medical problems listed below. Physiatrist and rehab team continue to assess barriers to  discharge/monitor patient progress toward functional and medical goals.  Function:  Bathing Bathing position Bathing activity did not occur: N/A Position: Shower  Bathing parts Body parts bathed by patient: Right arm, Left arm, Chest, Abdomen, Front perineal area, Right upper leg, Left upper leg, Right lower leg, Left lower leg, Buttocks Body parts bathed by helper: Back  Bathing assist Assist Level: Supervision or verbal cues      Upper Body Dressing/Undressing Upper body dressing   What is the patient wearing?: Pull over shirt/dress     Pull over shirt/dress - Perfomed by patient: Thread/unthread right sleeve, Thread/unthread left sleeve, Put head through opening, Pull shirt over trunk          Upper body assist Assist Level: Supervision or verbal cues, Set up   Set up : To obtain clothing/put away  Lower Body Dressing/Undressing Lower body dressing   What is the patient wearing?: Pants, Non-skid slipper socks, Underwear Underwear - Performed by patient: Pull underwear up/down, Thread/unthread right underwear leg, Thread/unthread left underwear leg   Pants- Performed by patient: Thread/unthread right pants leg, Thread/unthread left pants leg, Pull pants up/down   Non-skid slipper socks- Performed by patient: Don/doff right sock, Don/doff left sock Non-skid slipper socks- Performed by helper: Don/doff right sock, Don/doff left sock                  Lower body assist Assist for lower body dressing: Touching or steadying assistance (Pt > 75%)      Toileting Toileting Toileting activity did not occur: No continent bowel/bladder event Toileting steps completed by patient: Adjust clothing prior to toileting, Adjust clothing after toileting Toileting steps completed by  helper: Adjust clothing prior to toileting, Adjust clothing after toileting Toileting Assistive Devices: Grab bar or rail  Toileting assist Assist level: Touching or steadying assistance (Pt.75%)    Transfers Chair/bed transfer   Chair/bed transfer method: Ambulatory Chair/bed transfer assist level: Supervision or verbal cues Chair/bed transfer assistive device: Armrests, Patent attorney     Max distance: 150 ft Assist level: Supervision or verbal cues   Wheelchair   Type: Manual Max wheelchair distance: 10ft Assist Level: Touching or steadying assistance (Pt > 75%)  Cognition Comprehension Comprehension assist level: Understands basic 90% of the time/cues < 10% of the time  Expression Expression assist level: Expresses basic 90% of the time/requires cueing < 10% of the time.  Social Interaction Social Interaction assist level: Interacts appropriately 50 - 74% of the time - May be physically or verbally inappropriate.  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   -continue PT, OT, SLP   -to SNF today   -I can see in follow up in 4-6 weeks 2. DVT Prophylaxis/Anticoagulation: SCDs. Monitor for any signs of DVT 3. Pain Management:Tylenol as needed 4. Mood/reactive depression:team providing emotional support as possible.     -increased lexapro to 10mg  qhs with general improvements 5. Neuropsych: This patientiscapable of making decisions on hisown behalf. 6. Skin/Wound Care:Routine skin checks 7. Fluids/Electrolytes/Nutrition:encourage PO   -good appetite.   8.Supraventricular tachycardia with history of CAD/PCI and noted  findings of aortic stenosis. Current plan to follow up outpatient with cardiology services.  Began Eliquis 5 mg twice daily on 3/17 with decreased aspirin to 81 mg.  Now off plavix.      -needs to be scheduled for 30 day cardiac event monitor  9.Diet controlled diabetes mellitus. Hemoglobin A1c 4.8. Currently with SSI.   -sugars under tight control.    -Stopped CBG checks.  On regular diet 10.History of urinary retention.  Intermittent urinary incontinence.    - ua/ucx reviewed and are negative 11.Hyperlipidemia. Crestor 12.Constipation. Laxative assistance   -Changed Senokot-S to 2 tablets scheduled at bedtime    -+BM 3/27    LOS (Days) 15 A FACE TO FACE EVALUATION WAS PERFORMED  Ranelle Oyster, MD 03/07/2018 8:37 AM

## 2018-03-07 NOTE — Discharge Summary (Signed)
Occupational Therapy Discharge Summary  Patient Details  Name: Nicholas [name] MRN: [id number] Date of Birth: 1950/12/14  Today's Date: 03/07/2018 OT Individual Time: 1000-1100 OT Individual Time Calculation (min): 60 min   Patient has met 10 of 10 long term goals due to improved activity tolerance, improved balance, postural control, ability to compensate for deficits, improved attention, improved awareness and improved coordination.  Patient to discharge at overall Supervision level.  Pt to be d/c to SNF for next venue of care.   All goals met.   Recommendation:  Patient will benefit from ongoing skilled OT services in skilled nursing facility setting to continue to advance functional skills in the area of BADL and iADL.  Equipment: No equipment provided  Reasons for discharge: treatment goals met and discharge from hospital  Patient/family agrees with progress made and goals achieved: Yes   Skilled Therapeutic Intervention:  Pt greeted in w/c, no c/o pain. Tx focus on dynamic balance, cognitive remediation, Rt NMR/attention, and functional stand pivot transfers during bathing and dressing completion. Pt completed all tasks with close supervision. He continues to require min vcs to attend to Rt side in shower, and also safety cuing for transferring due to visual deficits. Continued to educate him on visual compensatory strategies to maximize functional independence. At end of session pt was left in w/c with safety belt fastened, all needs within reach, and dtr present. Educated both pt and dtr on his overall progress and goal achievement.   OT Discharge Precautions/Restrictions  Precautions Precautions: Fall Precaution Comments: visual deficits Restrictions Weight Bearing Restrictions: No Pain Pain Assessment Pain Scale: 0-10 Pain Score: 0-No pain ADL ADL ADL Comments: Please see functional navigator for ADL status Vision Baseline Vision/History: Wears glasses Wears  Glasses: At all times Patient Visual Report: Blurring of vision;Peripheral vision impairment(Rt homonymous hemianopsia) Additional Comments: Visual impairment impacts his ability to scan for ADL items and locate grab bars and armrests during functional transfers Perception  Perception: Impaired(improved since time of eval) Inattention/Neglect: Does not attend to right side of body Praxis Praxis: Impaired Praxis Impairment Details: Initiation;Motor planning Cognition Overall Cognitive Status: History of cognitive impairments - at baseline Arousal/Alertness: Awake/alert Orientation Level: Oriented to person;Oriented to place;Disoriented to time;Oriented to situation Attention: Sustained Sustained Attention: Impaired Memory: Impaired Awareness: Impaired Problem Solving: Impaired Behaviors: Poor frustration tolerance Safety/Judgment: Impaired Sensation Sensation Light Touch: Appears Intact Proprioception: Appears Intact Coordination Gross Motor Movements are Fluid and Coordinated: No Fine Motor Movements are Fluid and Coordinated: No Motor  Motor Motor: Abnormal postural alignment and control Mobility  Transfers Transfers: Sit to Stand;Stand to Sit Sit to Stand: 5: Supervision;From toilet Stand to Sit: To toilet  Trunk/Postural Assessment  Cervical Assessment Cervical Assessment: Within Functional Limits Thoracic Assessment Thoracic Assessment: Exceptions to WFL(rounded shoulders) Lumbar Assessment Lumbar Assessment: Within Functional Limits Postural Control Postural Control: Deficits on evaluation(Rt lean when standing during LB self care)  Balance Balance Balance Assessed: Yes Dynamic Sitting Balance Dynamic Sitting - Level of Assistance: 5: Stand by assistance Sitting balance - Comments: Bathing on TTB Dynamic Standing Balance Dynamic Standing - Level of Assistance: 5: Stand by assistance Dynamic Standing - Comments: LB bathing+ dressing Extremity/Trunk  Assessment RUE Assessment RUE Assessment: Within Functional Limits(Pt using Rt UE at dominant level during self care) LUE Assessment LUE Assessment: Within Functional Limits   See Function Navigator for Current Functional Status.  Vala Raffo A Gabbrielle Mcnicholas 03/07/2018, 12:28 PM

## 2018-03-10 ENCOUNTER — Non-Acute Institutional Stay (SKILLED_NURSING_FACILITY): Payer: Medicare Other | Admitting: Adult Health

## 2018-03-10 ENCOUNTER — Encounter: Payer: Self-pay | Admitting: Adult Health

## 2018-03-10 DIAGNOSIS — R269 Unspecified abnormalities of gait and mobility: Secondary | ICD-10-CM | POA: Diagnosis not present

## 2018-03-10 DIAGNOSIS — I6622 Occlusion and stenosis of left posterior cerebral artery: Secondary | ICD-10-CM | POA: Diagnosis not present

## 2018-03-10 DIAGNOSIS — I639 Cerebral infarction, unspecified: Secondary | ICD-10-CM

## 2018-03-10 DIAGNOSIS — H53461 Homonymous bilateral field defects, right side: Secondary | ICD-10-CM | POA: Diagnosis not present

## 2018-03-10 DIAGNOSIS — I69398 Other sequelae of cerebral infarction: Secondary | ICD-10-CM

## 2018-03-10 NOTE — Progress Notes (Addendum)
Location:  [facility] Room Number: 308-A Place of Service:  SNF (31) Provider:  Kenard Gower, NP  Patient Care Team: [name], MD as PCP - General [facility] Medicine)  Extended Emergency Contact Information Primary Emergency Contact: [name],[name] Address: [address]          [place], [place] [address] [place] Home Phone: [phone] Mobile Phone: [phone] Relation: Daughter Secondary Emergency Contact: Hart [place] Mobile Phone: [phone] Relation: Relative  Code Status:  DNR  Goals of care: Advanced Directive information Advanced Directives 02/20/2018  Does Patient Have a Medical Advance Directive? No  Would patient like information on creating a medical advance directive? Yes (Inpatient - patient defers creating a medical advance directive at this time)  Pre-existing out of facility DNR order (yellow form or pink MOST form) -     Chief Complaint  Patient presents with  . Acute Visit    Hospital followup, status post admission at [facility] 3/14-3/29/19 for occipital stroke.    HPI:  Pt is a 67 y.o. male seen today for hospital followup.  He was admitted to [facility] 03/07/18 for short-term rehabilitation following an admission at [facility] 3/14-3/29/19 for an occipital stroke.  He has a PMH of BPH, CAD, history of CVA and MI, and HLD. He had altered mental status and blurred vision. MRI brain revealed acute large nonhemorrhagic temporal occipital lobe PCA territory infarcts, small acute posterior circulation and posterior watershed infarcts and old bifrontal and biparietal lobe infarction. Tee revealed severe aortic stenosis. Neurology was consulted and recommended ASA and Plavix. He was admitted to inpatient rehab on 02/20/18. He was transitioned from started on Eliquis on 02/23/18 and decreased ASA to 81 mg daily.  He was seen in his room today. He verbalized that he  continues to have blurred vision.    Past Medical History:  Diagnosis Date  . At high risk for falls   . BPH (benign prostatic hypertrophy)    HX RETENTION  . Coronary artery disease CARDIOLOGIST--  DR Johney Frame   S/P  PCI TO LAD 1993  . GERD (gastroesophageal reflux disease)   . History of CVA (cerebrovascular accident)    1993---  NO RESIDUALS  . History of head injury    CONCUSSION--  NO RESIDUAL  . History of myocardial infarction    1993--  NON-Q WAVE  S/P PCI TO LAD  . Hydrocele, left   . Hyperlipidemia   . Left carotid artery stenosis    MILD  . Myocardial infarction (HCC)   . PSVT (paroxysmal supraventricular tachycardia) (HCC)   . PVD (peripheral vascular disease) (HCC)    S/P LEFT FEM-POP  . SBO (small bowel obstruction) (HCC)   . Wears glasses    Past Surgical History:  Procedure Laterality Date  . CARDIAC CATHETERIZATION  01-18-2003   DR Eden Emms   NON-OBSTRUCTIVE CAD/  PREVIOIS ANGIOPLASTY SITE WIDELY PATENT  . CARDIOVASCULAR STRESS TEST  2007   SMALL ANTERO-APICAL SCAR/  NO ISCHEMIA  . CAROTID ENDARTERECTOMY Right 05-31-2003  . CORONARY ANGIOPLASTY  1993   PCI TO LAD  . FEMORAL-POPLITEAL BYPASS GRAFT Left 01-19-2003  . HYDROCELE EXCISION Left 10/27/2013   Procedure: HYDROCELECTOMY ADULT;  Surgeon: Lindaann Slough, MD;  Location: [facility];  Service: Urology;  Laterality: Left;  . RIGHT HYDROCELECTOMY  05-31-2003  . TEE WITHOUT CARDIOVERSION N/A 02/19/2018   Procedure: TRANSESOPHAGEAL ECHOCARDIOGRAM (TEE);  Surgeon: Jake Bathe, MD;  Location[facility]COPY;  Service: Cardiovascular;  Laterality: N/A;  . TRANSTHORACIC ECHOCARDIOGRAM  08-24-2010   EF 55%/  MILD AORTIC INSUFFICENCY/  MODERATE LVH  . UMBILICAL HERNIA REPAIR N/A 10/21/2017   Procedure: UMBILICAL HERNIA REPAIR;  Surgeon: Manus Rudd, MD;  Location: [facility] OR;  Service: General;  Laterality: N/A;    No Known Allergies  Outpatient Encounter Medications as of 03/10/2018  Medication  Sig  . acetaminophen (TYLENOL) 325 MG tablet Take 2 tablets (650 mg total) by mouth every 4 (four) hours as needed for mild pain (or temp > 37.5 C (99.5 F)).  Marland Kitchen apixaban (ELIQUIS) 5 MG TABS tablet Take 1 tablet (5 mg total) by mouth 2 (two) times daily.  Marland Kitchen aspirin 81 MG EC tablet Take 1 tablet (81 mg total) by mouth daily.  Marland Kitchen escitalopram (LEXAPRO) 10 MG tablet Take 1 tablet (10 mg total) by mouth at bedtime.  Marland Kitchen NUTRITIONAL SUPPLEMENT LIQD Take 120 mLs by mouth 2 (two) times daily. MedPass  . rosuvastatin (CRESTOR) 10 MG tablet Take 1 tablet (10 mg total) by mouth daily.  Marland Kitchen senna-docusate (SENOKOT-S) 8.6-50 MG tablet Take 2 tablets by mouth at bedtime.   No facility-administered encounter medications on file as of 03/10/2018.     Review of Systems  GENERAL: No change in appetite, no fatigue, no weight changes, no fever, chills or weakness MOUTH and THROAT: Denies oral discomfort, gingival pain or bleeding, pain from teeth or hoarseness   RESPIRATORY: no cough, SOB, DOE, wheezing, hemoptysis CARDIAC: No chest pain, or palpitations GI: No abdominal pain, diarrhea, constipation, heart burn, nausea or vomiting GU: Denies dysuria, frequency, hematuria, incontinence, or discharge PSYCHIATRIC: Denies feelings of depression or anxiety. No report of hallucinations, insomnia, paranoia, or agitation   Immunization History  Administered Date(s) Administered  . Pneumococcal-Unspecified 09/09/2016   Pertinent  Health Maintenance Due  Topic Date Due  . FOOT EXAM  09/16/1961  . OPHTHALMOLOGY EXAM  09/16/1961  . URINE MICROALBUMIN  09/16/1961  . COLONOSCOPY  09/16/2001  . PNA vac Low Risk Adult (1 of 2 - PCV13) 09/09/2017  . INFLUENZA VACCINE  07/10/2018  . HEMOGLOBIN A1C  08/21/2018   Fall Risk  07/23/2014  Falls in the past year? Yes  Number falls in past yr: 2 or more  Risk Factor Category  High Fall Risk  Risk for fall due to : Mental status change     Vitals:   03/10/18 1109  BP:  130/80  Pulse: 88  Resp: 18  Temp: 98 F (36.7 C)  TempSrc: Oral  SpO2: 98%  Weight: 168 lb (76.2 kg)  Height: 5\' 11"  (1.803 m)   Body mass index is 23.43 kg/m.  Physical Exam  GENERAL APPEARANCE: Well nourished. In no acute distress. Normal body habitus SKIN:  Skin is warm and dry. There are no suspicious lesions or rash HEAD: Normal in size and contour. No evidence of trauma EYES: Lids open and close normally. No blepharitis, entropion or ectropion.  MOUTH and THROAT: Lips are without lesions. Oral mucosa is moist and without lesions. Tongue is normal in shape, size, and color and without lesions RESPIRATORY: Breathing is even & unlabored, BS CTAB CARDIAC: RRR, + murmur,no extra heart sounds, BLE 1+ edema GI: Abdomen soft, normal BS, no masses, no tenderness, no hepatomegaly, no splenomegaly EXTREMITIES:  Able to move X 4 extremities NEUROLOGICAL: There is no tremor. Speech is clear PSYCHIATRIC: Alert to self, disoriented to time and place. Affect and behavior are appropriate   Labs reviewed: Recent Labs  10/19/17 0749  02/18/18 0810 02/20/18 0428 02/21/18 0624  NA 137   < > 138 137 141  K 3.6   < > 3.7 3.8 4.1  CL 104   < > 103 101 104  CO2 27   < > 28 29 30   GLUCOSE 114*   < > 76 81 77  BUN 5*   < > 9 11 15   CREATININE 1.04   < > 0.86 1.03 1.05  CALCIUM 8.5*   < > 9.0 8.7* 9.0  MG 1.9  --   --  1.9  --    < > = values in this interval not displayed.   Recent Labs    10/20/17 0358 02/17/18 1609 02/21/18 0624  AST 24 45* 30  ALT 20 49 40  ALKPHOS 72 81 81  BILITOT 1.0 1.8* 1.6*  PROT 5.1* 6.3* 5.7*  ALBUMIN 2.9* 3.9 3.1*   Recent Labs    03/14/17 0544  02/17/18 1609 02/20/18 0428 02/21/18 0624  WBC 8.6   < > 7.5 13.4* 9.5  NEUTROABS 5.2  --   --  10.2* 6.4  HGB 13.6   < > 12.4* 12.0* 11.6*  HCT 40.0   < > 36.4* 34.9* 33.9*  MCV 99.0   < > 101.4* 100.6* 100.0  PLT 327   < > 285 265 273   < > = values in this interval not displayed.   Lab  Results  Component Value Date   TSH 1.370 04/26/2014   Lab Results  Component Value Date   HGBA1C 4.8 02/18/2018   Lab Results  Component Value Date   CHOL 94 02/18/2018   HDL 50 02/18/2018   LDLCALC 40 02/18/2018   TRIG 19 02/18/2018   CHOLHDL 1.9 02/18/2018    Significant Diagnostic Results in last 30 days:  Ct Angio Head W Or Wo Contrast  Result Date: 02/18/2018 CLINICAL DATA:  67 y/o M; confusion and stroke. History of left carotid artery stenosis, right carotid artery endarterectomy, carotid occlusion. EXAM: CT ANGIOGRAPHY HEAD AND NECK TECHNIQUE: Multidetector CT imaging of the head and neck was performed using the standard protocol during bolus administration of intravenous contrast. Multiplanar CT image reconstructions and MIPs were obtained to evaluate the vascular anatomy. Carotid stenosis measurements (when applicable) are obtained utilizing NASCET criteria, using the distal internal carotid diameter as the denominator. CONTRAST:  50mL ISOVUE-370 IOPAMIDOL (ISOVUE-370) INJECTION 76% COMPARISON:  02/17/2018 MRI of the head. 03/11/2017 MRA of the head. FINDINGS: CT HEAD FINDINGS Brain: Multiple small areas of hypodensity within the cerebellum and large left PCA area distribution acute infarctions as seen on MRI of the head. No interval hemorrhage identified. Large right and small left chronic parietal infarctions in bilateral chronic ACA distribution infarctions. Stable chronic microvascular ischemic changes and parenchymal volume loss of the brain. No hydrocephalus, extra-axial collection, or effacement of basilar cisterns. Vascular: As below. Skull: Normal. Negative for fracture or focal lesion. Sinuses: Mild paranasal sinus mucosal thickening greatest in the ethmoid air cells. Normal aeration of mastoid air cells. Orbits are unremarkable. Orbits: No acute finding. Review of the MIP images confirms the above findings CTA NECK FINDINGS Aortic arch: Bovine variant branching. Imaged  portion shows no evidence of aneurysm or dissection. No significant stenosis of the major arch vessel origins. Mild calcific atherosclerosis. Right carotid system: Proximal occlusion of right common carotid artery and complete occlusion of right internal carotid artery. Right external carotid branches are patent likely due to retrograde flow and collateralization. Chronic  postsurgical changes related to prior right carotid endarterectomy. Left carotid system: Dense calcification of the carotid bifurcation with mild 50% common carotid stenosis just proximal to the bifurcation. No dissection or occlusion. Vertebral arteries: Codominant. No evidence of dissection, stenosis (50% or greater) or occlusion. 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 OPLL and degenerative changes results in moderate to severe C5-6 canal stenosis. Other neck: Negative. Upper chest: Negative. Review of the MIP images confirms the above findings CTA HEAD FINDINGS Anterior circulation: Occluded right ICA petrous, cavernous, and paraclinoid segments. Right ICA terminal segment is patent. Otherwise no large vessel occlusion, aneurysm, or high-grade stenosis. Calcific atherosclerosis of the left carotid siphon with mild less than 50% stenosis. Posterior circulation: Left P2 occlusion (series 12, image 140). Otherwise no large vessel occlusion, stenosis, or aneurysm in the posterior circulation identified. Venous sinuses: As permitted by contrast timing, patent. Anatomic variants: Complete circle-of-Willis. Delayed phase: No abnormal intracranial enhancement. Review of the MIP images confirms the above findings IMPRESSION: 1. Left posterior cerebral artery P2 segment occlusion. 2. Stable right common carotid and internal carotid artery occlusion to the terminus. Patent right ICA terminus. 3. Otherwise patent anterior and posterior intracranial circulation without large  vessel occlusion, aneurysm, stenosis, or vascular malformation. 4. Patent left carotid system. Calcified plaque of left carotid bifurcation with mild 50% distal CCA stenosis. 5. Patent right dominant vertebrobasilar system. No significant stenosis, aneurysm, or dissection. 6. Acute infarcts in cerebellum and left PCA distribution as seen on MRI. No acute hemorrhage. 7. Multiple chronic infarcts, parenchymal volume loss, and chronic microvascular ischemic changes of the brain as above. 8. Cervical spondylosis and OPLL with moderate to severe C5-6 canal stenosis. These results were called by telephone at the time of interpretation on 02/18/2018 at 12:47 am to Dr. Therisa Doyne , who verbally acknowledged these results. Electronically Signed   By: Mitzi Hansen M.D.   On: 02/18/2018 00:47   Dg Chest 2 View  Result Date: 02/18/2018 CLINICAL DATA:  Status post CVA.  Acute onset of visual disturbance. EXAM: CHEST - 2 VIEW COMPARISON:  With chest radiograph performed 10/17/2017 FINDINGS: The lungs are well-aerated and clear. There is no evidence of focal opacification, pleural effusion or pneumothorax. The heart is normal in size; the mediastinal contour is within normal limits. No acute osseous abnormalities are seen. IMPRESSION: No acute cardiopulmonary process seen. Electronically Signed   By: Roanna Raider M.D.   On: 02/18/2018 00:58   Ct Angio Neck W Or Wo Contrast  Result Date: 02/18/2018 CLINICAL DATA:  67 y/o M; confusion and stroke. History of left carotid artery stenosis, right carotid artery endarterectomy, carotid occlusion. EXAM: CT ANGIOGRAPHY HEAD AND NECK TECHNIQUE: Multidetector CT imaging of the head and neck was performed using the standard protocol during bolus administration of intravenous contrast. Multiplanar CT image reconstructions and MIPs were obtained to evaluate the vascular anatomy. Carotid stenosis measurements (when applicable) are obtained utilizing NASCET criteria,  using the distal internal carotid diameter as the denominator. CONTRAST:  50mL ISOVUE-370 IOPAMIDOL (ISOVUE-370) INJECTION 76% COMPARISON:  02/17/2018 MRI of the head. 03/11/2017 MRA of the head. FINDINGS: CT HEAD FINDINGS Brain: Multiple small areas of hypodensity within the cerebellum and large left PCA area distribution acute infarctions as seen on MRI of the head. No interval hemorrhage identified. Large right and small left chronic parietal infarctions in bilateral chronic ACA distribution infarctions. Stable chronic microvascular ischemic changes and parenchymal volume loss of the brain. No hydrocephalus,  extra-axial collection, or effacement of basilar cisterns. Vascular: As below. Skull: Normal. Negative for fracture or focal lesion. Sinuses: Mild paranasal sinus mucosal thickening greatest in the ethmoid air cells. Normal aeration of mastoid air cells. Orbits are unremarkable. Orbits: No acute finding. Review of the MIP images confirms the above findings CTA NECK FINDINGS Aortic arch: Bovine variant branching. Imaged portion shows no evidence of aneurysm or dissection. No significant stenosis of the major arch vessel origins. Mild calcific atherosclerosis. Right carotid system: Proximal occlusion of right common carotid artery and complete occlusion of right internal carotid artery. Right external carotid branches are patent likely due to retrograde flow and collateralization. Chronic postsurgical changes related to prior right carotid endarterectomy. Left carotid system: Dense calcification of the carotid bifurcation with mild 50% common carotid stenosis just proximal to the bifurcation. No dissection or occlusion. Vertebral arteries: Codominant. No evidence of dissection, stenosis (50% or greater) or occlusion. 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 OPLL and degenerative changes results in moderate to severe C5-6 canal  stenosis. Other neck: Negative. Upper chest: Negative. Review of the MIP images confirms the above findings CTA HEAD FINDINGS Anterior circulation: Occluded right ICA petrous, cavernous, and paraclinoid segments. Right ICA terminal segment is patent. Otherwise no large vessel occlusion, aneurysm, or high-grade stenosis. Calcific atherosclerosis of the left carotid siphon with mild less than 50% stenosis. Posterior circulation: Left P2 occlusion (series 12, image 140). Otherwise no large vessel occlusion, stenosis, or aneurysm in the posterior circulation identified. Venous sinuses: As permitted by contrast timing, patent. Anatomic variants: Complete circle-of-Willis. Delayed phase: No abnormal intracranial enhancement. Review of the MIP images confirms the above findings IMPRESSION: 1. Left posterior cerebral artery P2 segment occlusion. 2. Stable right common carotid and internal carotid artery occlusion to the terminus. Patent right ICA terminus. 3. Otherwise patent anterior and posterior intracranial circulation without large vessel occlusion, aneurysm, stenosis, or vascular malformation. 4. Patent left carotid system. Calcified plaque of left carotid bifurcation with mild 50% distal CCA stenosis. 5. Patent right dominant vertebrobasilar system. No significant stenosis, aneurysm, or dissection. 6. Acute infarcts in cerebellum and left PCA distribution as seen on MRI. No acute hemorrhage. 7. Multiple chronic infarcts, parenchymal volume loss, and chronic microvascular ischemic changes of the brain as above. 8. Cervical spondylosis and OPLL with moderate to severe C5-6 canal stenosis. These results were called by telephone at the time of interpretation on 02/18/2018 at 12:47 am to Dr. Therisa Doyne , who verbally acknowledged these results. Electronically Signed   By: Mitzi Hansen M.D.   On: 02/18/2018 00:47   Mr Brain Wo Contrast  Result Date: 02/17/2018 CLINICAL DATA:  Amnesia today and vision  loss. Suspect stroke. History of hypertension, diabetes, status post RIGHT carotid endarterectomy. EXAM: MRI HEAD WITHOUT CONTRAST TECHNIQUE: Multiplanar, multiecho pulse sequences of the brain and surrounding structures were obtained without intravenous contrast. COMPARISON:  MRI of the head March 11, 2017 FINDINGS: INTRACRANIAL CONTENTS: Confluent reduced diffusion LEFT mesial temporal occipital lobe with low ADC values. Small sub foci of reduced diffusion bilateral cerebellum and RIGHT occipital lobe. Regional mass effect without midline shift. Punctate focus of reduced diffusion LEFT mesial thalamus, bilateral mesial parietal lobes. No susceptibility artifact to suggest hemorrhage. Mild susceptibility artifact associated with biparietal and bifrontal infarcts. Mild parenchymal brain volume. No abnormal extra-axial loss. No hydrocephalus fluid collections. VASCULAR: Chronically occluded RIGHT internal carotid artery. Remaining major flow voids preserved. SKULL AND UPPER CERVICAL SPINE: No abnormal sellar  expansion. No suspicious calvarial bone marrow signal. Craniocervical junction maintained. SINUSES/ORBITS: Moderate paranasal sinus mucosal thickening without air-fluid levels. Mastoid air cells are well aerated.The included ocular globes and orbital contents are non-suspicious. OTHER: None. IMPRESSION: 1. Acute large nonhemorrhagic temporal occipital lobe/PCA territory infarct. Additional small acute posterior circulation and posterior watershed infarcts. 2. Old bifrontal and biparietal lobe infarcts in ACA and MCA territories. 3. Chronically occluded RIGHT internal carotid artery. 4. Critical Value/emergent results were called by telephone at the time of interpretation on 02/17/2018 at 8:25 pm to Dr. Azalia Bilis , who verbally acknowledged these results. Electronically Signed   By: Awilda Metro M.D.   On: 02/17/2018 20:28    Assessment/Plan  1. Occipital stroke (HCC) and posterior cerebral artery  embolism, left - continue Eliquis 5 mg BID, ASA 81 mg daily and Rosuvastatin 10 mg 1 tab daily, follow-up with cardiology, Dr. Donato Schultz for 30-day cardiac monitor   2. Gait disturbance, post-stroke - for rehabilitation with PT and OT, for therapeutic strengthening exercises, fall precautions   3. Right homonymous hemianopsia - fall precautions, encourage to ask for assistance when needed, keep rooms free of clutters    Family/ staff Communication: Discussed plan of care with patient.  Labs/tests ordered:  For CBC on 03/11/18  Goals of care:   Short-term rehabilitation.   Kenard Gower, NP [facility] [phone] (Monday-Friday 8:00 a.m. - 5:00 p.m.) [phone] (after hours)

## 2018-03-11 ENCOUNTER — Non-Acute Institutional Stay (SKILLED_NURSING_FACILITY): Payer: Medicare Other | Admitting: Internal Medicine

## 2018-03-11 ENCOUNTER — Encounter: Payer: Self-pay | Admitting: Internal Medicine

## 2018-03-11 DIAGNOSIS — I639 Cerebral infarction, unspecified: Secondary | ICD-10-CM | POA: Diagnosis not present

## 2018-03-11 DIAGNOSIS — I471 Supraventricular tachycardia: Secondary | ICD-10-CM

## 2018-03-11 DIAGNOSIS — L89159 Pressure ulcer of sacral region, unspecified stage: Secondary | ICD-10-CM

## 2018-03-11 DIAGNOSIS — I350 Nonrheumatic aortic (valve) stenosis: Secondary | ICD-10-CM | POA: Diagnosis not present

## 2018-03-11 DIAGNOSIS — F32A Depression, unspecified: Secondary | ICD-10-CM

## 2018-03-11 DIAGNOSIS — E119 Type 2 diabetes mellitus without complications: Secondary | ICD-10-CM

## 2018-03-11 DIAGNOSIS — F329 Major depressive disorder, single episode, unspecified: Secondary | ICD-10-CM | POA: Diagnosis not present

## 2018-03-11 NOTE — Assessment & Plan Note (Signed)
Cardiology follow-up with Dr. Tonny BollmanMichael Cooper

## 2018-03-11 NOTE — Progress Notes (Signed)
NURSING HOME LOCATION:  [facility] ROOM NUMBER:  [id number]  CODE STATUS:  DNR  PCP:  Leilani Ableeese, Betti, MD  [address] [place] [id number]   This is a comprehensive admission note to [facility] performed on this date less than 30 days from date of admission. Included are preadmission medical/surgical history;reconciled medication list; family history; social history and comprehensive review of systems.  Corrections and additions to the records were documented. Comprehensive physical exam was also performed. Additionally a clinical summary was entered for each active diagnosis pertinent to this admission in the Problem List to enhance continuity of care.  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 The patient was an [facility]14[facility]19 for acute large nonhemorrhagic temporal occipital lobe PCA territory infarcts, small acute posterior circulation and posterior watershed infarcts and old bifrontal and biparietal lobe infarction. TEE revealed severe aortic stenosis. Cardiology follow with Dr. Excell Seltzerooper after rehabilitation as well as a 30 day monitor were recommended following discharge. Neurology had recommended aspirin and Plavix for CVA prophylaxis with transition to Eliquis 5 mg twice a day on 3/17. On that date aspirin was decreased to 81 mg daily. Glucoses were well-controlled and CBG checks were discontinued. In fact A1c was nondiabetic at 4.8% on 02/18/18.Marland Kitchen. Rehabilitation course was complicated by intermittent bladder incontinence. Because of visual deficits and gait issues close supervision was recommended.MoCA score was 11 out of 30. He required maximum clues for motivation and attention. Supervision was required for transfer and for ambulation up to 150 feet with a  rolling walker. The most recent labs in Epic were 02/21/18. Albumin was 3.1, total protein 5.7, bilirubin 1.6, and glucose 77. Slightly macrocytic anemia was present with hemoglobin 11.6 and hematocrit 33.9  Past medical and surgical history: Includes prior CVA, history of concussion, PSVT, CAD with past history MI, peripheral vascular disease with carotid artery stenosis,and dyslipidemia. Pertinent surgeries and procedures include femoral-popliteal bypass grafting, carotid endarterectomy, and coronary angioplasty.  Social history: Former smoker, nondrinker.  Family history: Reviewed   Review of systems:  Responses unreliable due to mental acuity deficit. Patient gave the date as October 2003. He was unable to identify his PCP or cardiologist. Extensive review of systems was positive only for constipation, depression, urinary frequency. Constitutional: No fever,significant weight change, fatigue  Eyes: No redness, discharge, pain, vision change ENT/mouth: No nasal congestion, purulent discharge, earache, change in hearing, sore throat  Cardiovascular: No chest pain, palpitations, paroxysmal nocturnal dyspnea, claudication, edema  Respiratory: No cough, sputum production, hemoptysis, DOE, significant snoring, apnea Gastrointestinal: No heartburn, dysphagia, abdominal pain, nausea /vomiting, rectal bleeding, melena Genitourinary: No dysuria, hematuria, pyuria Musculoskeletal: No joint stiffness, joint swelling, weakness, pain Dermatologic: No rash, pruritus, change in appearance of skin Neurologic: No dizziness, headache, syncope, seizures, numbness, tingling Endocrine: No change in hair/skin/ nails, excessive thirst, excessive hunger, excessive urination  Hematologic/lymphatic: No significant bruising, lymphadenopathy, abnormal bleeding Allergy/immunology: No itchy/watery eyes, significant sneezing, urticaria, angioedema  Physical exam:  Pertinent or positive findings: Affect is profoundly  flat. He tends to keep his eyes closed throughout the interview and exam. Pattern alopecia is present. The left eyebrow is lower than the right; but he can elevate the eyebrows. He has right hemianopsia. Arcus senilis is present. Ptosis is present on the left. He is Vear Clockunshaven,he has a IT consultantgoatee. There is winging of the  left inferior thorax. He has a grade 3 harsh systolic murmur loudest at the right base. There is left carotid bruit versus radiation of the murmur. Scar tissue is present over the right carotid area. There are mild rales on auscultation. Pedal pulses are decreased. He has 1+ edema of the left lower extremity and 1/2+ of the right. There is a slight intention tremor of the left hand. He is diffusely weak but more so in the left upper and left lower extremities. Foot drop is suggested on the left.Wound Care Nurse reports small sacral decubitus & will follow.  General appearance: Adequately nourished; no acute distress, increased work of breathing is present.   Lymphatic: No lymphadenopathy about the head, neck, axilla. Eyes: No conjunctival inflammation or lid edema is present. There is no scleral icterus. Ears:  External ear exam shows no significant lesions or deformities.   Nose:  External nasal examination shows no deformity or inflammation. Nasal mucosa are pink and moist without lesions, exudates Oral exam: Lips and gums are healthy appearing.There is no oropharyngeal erythema or exudate. Neck:  No thyromegaly, masses, tenderness noted.    Heart:  Normal rate and regular rhythm. S1 and S2 normal without gallop, click, rub .  Lungs:  without wheezes, rhonchi, rubs. Abdomen: Bowel sounds are normal.  Abdomen is soft and nontender with no organomegaly, hernias, masses. GU: Deferred  Extremities:  No cyanosis, clubbing  Neurologic exam:   Balance, Rhomberg, finger to nose testing could not be completed due to clinical state Skin: Warm & dry w/o tenting. No significant lesions or  rash.  See clinical summary under each active problem in the Problem List with associated updated therapeutic plan

## 2018-03-11 NOTE — Assessment & Plan Note (Signed)
Monitor A1c every 4-6 months; no indication for medications at this time

## 2018-03-11 NOTE — Patient Instructions (Signed)
See assessment and plan under each diagnosis in the problem list and acutely for this visit 

## 2018-03-11 NOTE — Assessment & Plan Note (Signed)
Cardiology follow-up with Dr. Donato SchultzMark [name]; 30 day event monitor planned

## 2018-03-11 NOTE — Assessment & Plan Note (Addendum)
Eliquis 5 mg twice a day and low-dose aspirin Neurology follow-up with Dr.Jindong Roda ShuttersXu

## 2018-03-12 DIAGNOSIS — F339 Major depressive disorder, recurrent, unspecified: Secondary | ICD-10-CM | POA: Insufficient documentation

## 2018-03-12 LAB — CBC AND DIFFERENTIAL
HEMATOCRIT: 42 (ref 41–53)
Hemoglobin: 13.8 (ref 13.5–17.5)
Neutrophils Absolute: 4
Platelets: 358 (ref 150–399)
WBC: 6.7

## 2018-03-12 NOTE — Assessment & Plan Note (Signed)
Wound Care Nurse aware & will follow

## 2018-03-12 NOTE — Assessment & Plan Note (Signed)
Psych NP consultation 

## 2018-03-12 NOTE — Progress Notes (Signed)
Please set up follow up with Dr. Mayford Knifeurner (saw in hospital). APP on her team OK.  Donato SchultzMark Skains, MD

## 2018-03-13 NOTE — Telephone Encounter (Signed)
This encounter was created in error - please disregard.

## 2018-03-17 ENCOUNTER — Encounter: Payer: Self-pay | Admitting: Cardiology

## 2018-03-20 NOTE — Progress Notes (Signed)
Noted  Can we try aand track him down and arrange for 30 d monitor

## 2018-03-28 ENCOUNTER — Ambulatory Visit: Payer: Medicare Other | Admitting: Internal Medicine

## 2018-04-07 ENCOUNTER — Encounter: Payer: Self-pay | Admitting: Internal Medicine

## 2018-04-07 ENCOUNTER — Encounter: Payer: Medicare Other | Attending: Physical Medicine & Rehabilitation | Admitting: Physical Medicine & Rehabilitation

## 2018-04-08 ENCOUNTER — Encounter: Payer: Self-pay | Admitting: Adult Health

## 2018-04-08 ENCOUNTER — Ambulatory Visit (INDEPENDENT_AMBULATORY_CARE_PROVIDER_SITE_OTHER): Payer: Medicare Other | Admitting: Adult Health

## 2018-04-08 VITALS — HR 72 | Ht 68.0 in

## 2018-04-08 DIAGNOSIS — E785 Hyperlipidemia, unspecified: Secondary | ICD-10-CM | POA: Diagnosis not present

## 2018-04-08 DIAGNOSIS — I10 Essential (primary) hypertension: Secondary | ICD-10-CM

## 2018-04-08 DIAGNOSIS — I639 Cerebral infarction, unspecified: Secondary | ICD-10-CM

## 2018-04-08 DIAGNOSIS — E119 Type 2 diabetes mellitus without complications: Secondary | ICD-10-CM

## 2018-04-08 DIAGNOSIS — H53461 Homonymous bilateral field defects, right side: Secondary | ICD-10-CM

## 2018-04-08 DIAGNOSIS — I63432 Cerebral infarction due to embolism of left posterior cerebral artery: Secondary | ICD-10-CM

## 2018-04-08 NOTE — Patient Instructions (Signed)
Continue aspirin 81 mg daily and Eliquis (apixaban) daily  and crestor  for secondary stroke prevention  Continue to follow up with PCP regarding cholesterol, blood pressure, and diabetes  Continue PT/OT and if you need additional orders please let us know  Have cardiac monitor placed tomorrow   Maintain strict control of hypertension with blood pressure goal below 130/90, diabetes with hemoglobin A1c goal below 6.5% and cholesterol with LDL cholesterol (bad cholesterol) goal below 70 mg/dL. I also advised the patient to eat a healthy diet with plenty of whole grains, cereals, fruits and vegetables, exercise regularly and maintain ideal body weight.  Followup in the future with me in 4 months or call earlier if needed

## 2018-04-08 NOTE — Progress Notes (Signed)
[facility] [address] [place]. [place] [address] [phone]       OFFICE FOLLOW UP NOTE  [name] Date of Birth:  09-09-1951 Medical Record Number:  [id number]   Reason for Referral:  hospital stroke follow up  CHIEF COMPLAINT:  Chief Complaint  Patient presents with  . Follow-up    Stroke hospital follow up    HPI: Nicholas [name] is being seen today for initial visit in the office for left PCA stroke on 02/17/18. History obtained from patient and chart review. Reviewed all radiology images and labs personally.  [name] is a 67 year old male with history of right carotid artery disease status post right CEA, left carotid stenosis, CAD/MI status post PCI in 1983, CVA 1983, HLD, HTN, DM, paroxysmal SVT, PVD status post bypass admitted for confusion. MRI showed left large PCA infarct, b/l cerebellar punctate infarcts and b/l distal ACA/PCA infarcts, as well as old b/l MCA and ACA infarcts. CTA head and neck showed left P2 occlusion, chronic right CCA/ICA occlusion and left CCA/ICA 50% stenosis. EF 60-65%, LDL 40 and A1C 5.4. Pt stroke cardioembolic pattern, given hx of PSVT on digoxin and cardizem at home, highly suspicious for PAF, TEE unremarkable. EP saw pt and recommend 30 day cardiac event monitoring first and if negative, then consider loop recorder. Continue to have right hemianopia, disorientation, left foot DF/PF deficit. He was on plavix and crestor at home, ASA  added into the regimen due to intracranial stenosis. Continue DAPT for 3 months and then plavix alone. Prior to discharge, it was recommended that patient start Eliquis and continue aspirin  for possible history of atrial fibrillation according to past documentation9i. Recommended to continue crestor at current dose. PT/OT recommended SNF placement.  Since discharge, patient has been doing well.  He is currently living at [facility] where he receives  PT/OT.  Continues to have right homonymous hemianopia with mild improvement.  He did have a set back in memory after the stroke with complaints of short-term memory loss but this does continue to improve per daughter.  He is currently sitting in wheelchair for which he uses for long distance only.  He typically uses rolling walker and gait belt with a one assist for ambulation.  Blood pressure today satisfactory at 140/58.  Continues to take Eliquis and aspirin without side effects of bleeding or bruising.  Continues to take Crestor without side effects of myalgias.  He will be obtaining cardiac monitor tomorrow but daughter is afraid he will not tolerate this and continue to remove.   ROS:   14 system review of systems performed and negative with exception of double vision, loss of vision, cold intolerance, abdominal pain, frequency of urination, urgency, itching, memory loss, confusion, depression  PMH:  Past Medical History:  Diagnosis Date  . At high risk for falls   . BPH (benign prostatic hypertrophy)    HX RETENTION  . Coronary artery disease CARDIOLOGIST--  DR Johney Frame   S/P  PCI TO LAD 1993  . Diabetes mellitus without complication (HCC)   . GERD (gastroesophageal reflux disease)   . History of CVA (cerebrovascular accident)    1993---  NO RESIDUALS  . History of head injury    CONCUSSION--  NO RESIDUAL  . History of myocardial infarction    1993--  NON-Q WAVE  S/P PCI TO LAD  . Hydrocele, left   . Hyperlipidemia   . Left carotid artery stenosis  MILD  . PSVT (paroxysmal supraventricular tachycardia) (HCC)   . PVD (peripheral vascular disease) (HCC)    S/P LEFT FEM-POP  . SBO (small bowel obstruction) (HCC)   . Stroke [facility])     PSH:  Past Surgical History:  Procedure Laterality Date  . CARDIAC CATHETERIZATION  01-18-2003   DR Eden Emms   NON-OBSTRUCTIVE CAD/  PREVIOIS ANGIOPLASTY SITE WIDELY PATENT  . CARDIOVASCULAR STRESS TEST  2007   SMALL ANTERO-APICAL SCAR/  NO  ISCHEMIA  . CAROTID ENDARTERECTOMY Right 05-31-2003  . CORONARY ANGIOPLASTY  1993   PCI TO LAD  . FEMORAL-POPLITEAL BYPASS GRAFT Left 01-19-2003  . HYDROCELE EXCISION Left 10/27/2013   Procedure: HYDROCELECTOMY ADULT;  Surgeon: Lindaann Slough, MD;  Location: [facility];  Service: Urology;  Laterality: Left;  . RIGHT HYDROCELECTOMY  05-31-2003  . TEE WITHOUT CARDIOVERSION N/A 02/19/2018   Procedure: TRANSESOPHAGEAL ECHOCARDIOGRAM (TEE);  Surgeon: Jake Bathe, MD;  Location: [facility] ENDOSCOPY;  Service: Cardiovascular;  Laterality: N/A;  . TRANSTHORACIC ECHOCARDIOGRAM  08-24-2010   EF 55%/  MILD AORTIC INSUFFICENCY/  MODERATE LVH  . UMBILICAL HERNIA REPAIR N/A 10/21/2017   Procedure: UMBILICAL HERNIA REPAIR;  Surgeon: Manus Rudd, MD;  Location: [facility] OR;  Service: General;  Laterality: N/A;    Social History:  Social History   Socioeconomic History  . Marital status: Legally Separated    Spouse name: Not on file  . Number of children: Not on file  . Years of education: Not on file  . Highest education level: Not on file  Occupational History  . Not on file  Social Needs  . Financial resource strain: Not on file  . Food insecurity:    Worry: Not on file    Inability: Not on file  . Transportation needs:    Medical: Not on file    Non-medical: Not on file  Tobacco Use  . Smoking status: Former Smoker    Years: 0.00    Types: Cigarettes  . Smokeless tobacco: Never Used  . Tobacco comment: very seldom   Substance and Sexual Activity  . Alcohol use: No  . Drug use: No  . Sexual activity: Not on file  Lifestyle  . Physical activity:    Days per week: Not on file    Minutes per session: Not on file  . Stress: Not on file  Relationships  . Social connections:    Talks on phone: Not on file    Gets together: Not on file    Attends religious service: Not on file    Active member of club or organization: Not on file    Attends meetings of clubs or  organizations: Not on file    Relationship status: Not on file  . Intimate partner violence:    Fear of current or ex partner: Not on file    Emotionally abused: Not on file    Physically abused: Not on file    Forced sexual activity: Not on file  Other Topics Concern  . Not on file  Social History Narrative   Patient lives alone.  He has a daughter and brother who live nearby and check on him.    Family History:  Family History  Problem Relation Age of Onset  . Stroke Mother   . Hypertension Mother   . Hypertension Father   . Stroke Father     Medications:   Current Outpatient Medications on File Prior to Visit  Medication Sig Dispense Refill  . acetaminophen (TYLENOL)  325 MG tablet Take 2 tablets (650 mg total) by mouth every 4 (four) hours as needed for mild pain (or temp > 37.5 C (99.5 F)).    Marland Kitchen apixaban (ELIQUIS) 5 MG TABS tablet Take 1 tablet (5 mg total) by mouth 2 (two) times daily.    Marland Kitchen aspirin 81 MG EC tablet Take 1 tablet (81 mg total) by mouth daily.    Marland Kitchen escitalopram (LEXAPRO) 10 MG tablet Take 1 tablet (10 mg total) by mouth at bedtime.    Marland Kitchen NUTRITIONAL SUPPLEMENT LIQD Take 120 mLs by mouth 2 (two) times daily. MedPass    . rosuvastatin (CRESTOR) 10 MG tablet Take 1 tablet (10 mg total) by mouth daily. 30 tablet 0  . senna-docusate (SENOKOT-S) 8.6-50 MG tablet Take 2 tablets by mouth at bedtime.     No current facility-administered medications on file prior to visit.     Allergies:  No Known Allergies   Physical Exam  Vitals:   04/08/18 1414  Pulse: 72  Height:  (1.727 m)   Body mass index is 25.45 kg/m. No exam data present  General: well developed, well nourished, seated, in no evident distress Head: head normocephalic and atraumatic.   Neck: supple with no carotid or supraclavicular bruits Cardiovascular: regular rate and rhythm, no murmurs Musculoskeletal: no deformity Skin:  no rash/petichiae Vascular:  Normal pulses all  extremities  Neurologic Exam Mental Status: Awake and fully alert. Oriented to place and time. Recent and remote memory intact. Attention span, concentration and fund of knowledge appropriate. Mood and affect appropriate.  Cranial Nerves: Fundoscopic exam reveals sharp disc margins. Pupils equal, briskly reactive to light. Extraocular movements full without nystagmus. Visual fields full to confrontation. Hearing intact. Facial sensation intact. Face, tongue, palate moves normally and symmetrically.  Motor: Normal bulk and tone. Normal strength in all tested extremity muscles. Sensory.: intact to touch , pinprick , position and vibratory sensation.  Coordination: Rapid alternating movements normal in all extremities. Finger-to-nose and heel-to-shin performed accurately bilaterally. Gait and Station: Arises from chair without difficulty. Stance is normal. Gait demonstrates normal stride length and balance . Able to heel, toe and tandem walk without difficulty.  Reflexes: 1+ and symmetric. Toes downgoing.    NIHSS  3 Modified Rankin  4   Diagnostic Data (Labs, Imaging, Testing)  MR BRAIN WO CONTRAST 02/17/18 IMPRESSION: 1. Acute large nonhemorrhagic temporal occipital lobe/PCA territory infarct. Additional small acute posterior circulation and posterior watershed infarcts. 2. Old bifrontal and biparietal lobe infarcts in ACA and MCA territories. 3. Chronically occluded RIGHT internal carotid artery. 4. Critical Value/emergent results were called by telephone at the time of interpretation on 02/17/2018 at 8:25 pm to Dr. Azalia Bilis , who verbally acknowledged these results.  CT ANGIO HEAD W OR WO CONTRAST CT ANGIO NECK W OR WO CONTRAST 02/17/18 IMPRESSION: 1. Left posterior cerebral artery P2 segment occlusion. 2. Stable right common carotid and internal carotid artery occlusion to the terminus. Patent right ICA terminus. 3. Otherwise patent anterior and posterior intracranial  circulation without large vessel occlusion, aneurysm, stenosis, or vascular malformation. 4. Patent left carotid system. Calcified plaque of left carotid bifurcation with mild 50% distal CCA stenosis. 5. Patent right dominant vertebrobasilar system. No significant stenosis, aneurysm, or dissection. 6. Acute infarcts in cerebellum and left PCA distribution as seen on MRI. No acute hemorrhage. 7. Multiple chronic infarcts, parenchymal volume loss, and chronic microvascular ischemic changes of the brain as above. 8. Cervical spondylosis and OPLL with moderate to severe  C5-6 canal Stenosis.  2D ECHOCARDIOGRAM 02/18/18 Study Conclusions - Left ventricle: The cavity size was normal. Wall thickness was   increased in a pattern of moderate LVH. Systolic function was   normal. The estimated ejection fraction was in the range of 60%   to 65%. Wall motion was normal; there were no regional wall   motion abnormalities. Doppler parameters are consistent with   abnormal left ventricular relaxation (grade 1 diastolic   dysfunction). - Aortic valve: Functionally bicuspid; severely thickened, severely   calcified leaflets. Valve mobility was restricted. There was   severe stenosis. There was mild regurgitation. Valve area (VTI):   0.81 cm^2. Valve area (Vmax): 0.75 cm^2. Valve area (Vmean): 0.87   cm^2. - Mitral valve: Mild prolapse, involving the anterior leaflet. - Left atrium: The atrium was mildly dilated.  ECHO TEE Study Conclusions - Left ventricle: The cavity size was normal. Wall thickness was   normal. Systolic function was normal. The estimated ejection   fraction was in the range of 60% to 65%. Wall motion was normal;   there were no regional wall motion abnormalities. - Aortic valve: Valve mobility was restricted. There was severe   stenosis. There was moderate regurgitation. Peak velocity (S):   529 cm/s. Mean gradient (S): 46 mm Hg. - Mitral valve: No evidence of vegetation. -  Left atrium: No evidence of thrombus in the appendage. - Right atrium: No evidence of thrombus in the atrial cavity or   appendage. - Atrial septum: No defect or patent foramen ovale was identified.   Echo contrast study showed no right-to-left atrial level shunt,   following an increase in RA pressure induced by provocative   maneuvers. Echo contrast study showed no right-to-left atrial   level shunt, at baseline or with provocation. - Tricuspid valve: No evidence of vegetation. - Pulmonic valve: No evidence of vegetation. - Superior vena cava: The study excluded a thrombus.      ASSESSMENT: AYMEN WIDRIG is a 67 y.o. year old male here with left PCA stroke on 02/17/18 secondary to likely cardioembolic vs small vessel disease. Vascular risk factors include HTN, HLD, DM and CAD.     PLAN: -Continue aspirin 81 mg daily and Eliquis (apixaban) daily  and crestor  for secondary stroke prevention -F/u with PCP regarding your HLD, HTN, DM and CAD management -continue to monitor BP at home -continue PT/OT - if additional orders are needed we will place them -cardiac monitor will be placed tomorrow -Maintain strict control of hypertension with blood pressure goal below 130/90, diabetes with hemoglobin A1c goal below 6.5% and cholesterol with LDL cholesterol (bad cholesterol) goal below 70 mg/dL. I also advised the patient to eat a healthy diet with plenty of whole grains, cereals, fruits and vegetables, exercise regularly and maintain ideal body weight.  Follow up in 4 months or call earlier if needed   Greater than 50% time during this 25 minute consultation visit was spent on counseling and coordination of care about HTN, HLD, DM and CAD, discussion about risk benefit of anticoagulation and answering questions.     George Hugh, AGNP-BC  [facility] [address] [place], [place] [address]  Phone [phone] Fax [phone]

## 2018-04-09 ENCOUNTER — Ambulatory Visit: Payer: Medicare Other

## 2018-04-09 ENCOUNTER — Other Ambulatory Visit: Payer: Self-pay | Admitting: Cardiology

## 2018-04-09 ENCOUNTER — Ambulatory Visit (INDEPENDENT_AMBULATORY_CARE_PROVIDER_SITE_OTHER): Payer: Medicare Other

## 2018-04-09 DIAGNOSIS — I471 Supraventricular tachycardia: Secondary | ICD-10-CM

## 2018-04-09 DIAGNOSIS — I639 Cerebral infarction, unspecified: Secondary | ICD-10-CM | POA: Diagnosis not present

## 2018-04-09 DIAGNOSIS — I4891 Unspecified atrial fibrillation: Secondary | ICD-10-CM | POA: Diagnosis not present

## 2018-04-11 NOTE — Progress Notes (Signed)
I reviewed above note and agree with the assessment and plan.   Marvel Plan, MD PhD Stroke Neurology 04/11/2018 4:40 PM

## 2018-04-16 ENCOUNTER — Encounter: Payer: Self-pay | Admitting: Adult Health

## 2018-04-16 ENCOUNTER — Non-Acute Institutional Stay (SKILLED_NURSING_FACILITY): Payer: Medicare Other | Admitting: Adult Health

## 2018-04-16 DIAGNOSIS — E785 Hyperlipidemia, unspecified: Secondary | ICD-10-CM | POA: Diagnosis not present

## 2018-04-16 DIAGNOSIS — Z8673 Personal history of transient ischemic attack (TIA), and cerebral infarction without residual deficits: Secondary | ICD-10-CM | POA: Diagnosis not present

## 2018-04-16 DIAGNOSIS — F339 Major depressive disorder, recurrent, unspecified: Secondary | ICD-10-CM | POA: Diagnosis not present

## 2018-04-16 DIAGNOSIS — K5901 Slow transit constipation: Secondary | ICD-10-CM

## 2018-04-16 DIAGNOSIS — I10 Essential (primary) hypertension: Secondary | ICD-10-CM

## 2018-04-16 NOTE — Progress Notes (Signed)
Location:  [facility] Room Number: 308 Place of Service:  SNF (31) Provider:  Kenard Gower, NP  Patient Care Team: [name], MD as PCP - General [facility] Medicine)  Extended Emergency Contact Information Primary Emergency Contact: [name],[name] Address: [address]          [place], [place] [address] [place] of [place] Home Phone: [phone] Mobile Phone: [phone] Relation: Daughter Secondary Emergency Contact: Hart [place] Mobile Phone: [phone] Relation: Relative  Code Status:  DNR  Goals of care: Advanced Directive information Advanced Directives 03/11/2018  Does Patient Have a Medical Advance Directive? Yes  Type of Advance Directive Out of facility DNR (pink MOST or yellow form)  Does patient want to make changes to medical advance directive? No - Patient declined  Would patient like information on creating a medical advance directive? No - Patient declined  Pre-existing out of facility DNR order (yellow form or pink MOST form) -     Chief Complaint  Patient presents with  . Medical Management of Chronic Issues    Routine [facility] visit    HPI:  Pt is a 67 y.o. male seen today for medical management of chronic diseases.  He is a long-term care resident of [facility].  He has a PMH of BPH, CAD, history of CVA and MI, and hyperlipidemia. He was seen in the room today. He continues to have rehabilitation with PT and OT.    Past Medical History:  Diagnosis Date  . At high risk for falls   . BPH (benign prostatic hypertrophy)    HX RETENTION  . Coronary artery disease CARDIOLOGIST--  DR Johney Frame   S/P  PCI TO LAD 1993  . Diabetes mellitus without complication (HCC)   . GERD (gastroesophageal reflux disease)   . History of CVA (cerebrovascular accident)    1993---  NO RESIDUALS  . History of head injury    CONCUSSION--  NO RESIDUAL  . History of myocardial  infarction    1993--  NON-Q WAVE  S/P PCI TO LAD  . Hydrocele, left   . Hyperlipidemia   . Left carotid artery stenosis    MILD  . PSVT (paroxysmal supraventricular tachycardia) (HCC)   . PVD (peripheral vascular disease) (HCC)    S/P LEFT FEM-POP  . SBO (small bowel obstruction) (HCC)   . Stroke [facility])    Past Surgical History:  Procedure Laterality Date  . CARDIAC CATHETERIZATION  01-18-2003   DR Eden Emms   NON-OBSTRUCTIVE CAD/  PREVIOIS ANGIOPLASTY SITE WIDELY PATENT  . CARDIOVASCULAR STRESS TEST  2007   SMALL ANTERO-APICAL SCAR/  NO ISCHEMIA  . CAROTID ENDARTERECTOMY Right 05-31-2003  . CORONARY ANGIOPLASTY  1993   PCI TO LAD  . FEMORAL-POPLITEAL BYPASS GRAFT Left 01-19-2003  . HYDROCELE EXCISION Left 10/27/2013   Procedure: HYDROCELECTOMY ADULT;  Surgeon: Lindaann Slough, MD;  Location: [facility];  Service: Urology;  Laterality: Left;  . RIGHT HYDROCELECTOMY  05-31-2003  . TEE WITHOUT CARDIOVERSION N/A 02/19/2018   Procedure: TRANSESOPHAGEAL ECHOCARDIOGRAM (TEE);  Surgeon: Jake Bathe, MD;  Location: [facility] ENDOSCOPY;  Service: Cardiovascular;  Laterality: N/A;  . TRANSTHORACIC ECHOCARDIOGRAM  08-24-2010   EF 55%/  MILD AORTIC INSUFFICENCY/  MODERATE LVH  . UMBILICAL HERNIA REPAIR N/A 10/21/2017   Procedure: UMBILICAL HERNIA REPAIR;  Surgeon: Manus Rudd, MD;  Location: [facility] OR;  Service: General;  Laterality: N/A;    No Known Allergies  Outpatient Encounter Medications as of 04/16/2018  Medication Sig  . acetaminophen (TYLENOL) 325 MG tablet Take 2 tablets (650 mg total) by mouth every 4 (four) hours as needed for mild pain (or temp > 37.5 C (99.5 F)).  Marland Kitchen apixaban (ELIQUIS) 5 MG TABS tablet Take 1 tablet (5 mg total) by mouth 2 (two) times daily.  Marland Kitchen aspirin 81 MG EC tablet Take 1 tablet (81 mg total) by mouth daily.  Marland Kitchen escitalopram (LEXAPRO) 10 MG tablet Take 1 tablet (10 mg total) by mouth at bedtime.  Marland Kitchen NUTRITIONAL SUPPLEMENT LIQD Take 120 mLs by mouth 2  (two) times daily. MedPass  . rosuvastatin (CRESTOR) 10 MG tablet Take 1 tablet (10 mg total) by mouth daily.  Marland Kitchen senna-docusate (SENOKOT-S) 8.6-50 MG tablet Take 2 tablets by mouth at bedtime.   No facility-administered encounter medications on file as of 04/16/2018.     Review of Systems  GENERAL: No change in appetite, no fatigue, no weight changes, no fever, chills or weakness MOUTH and THROAT: Denies oral discomfort, gingival pain or bleeding, pain from teeth or hoarseness   RESPIRATORY: no cough, SOB, DOE, wheezing, hemoptysis CARDIAC: No chest pain, edema or palpitations GI: No abdominal pain, diarrhea, constipation, heart burn, nausea or vomiting GU: Denies dysuria, frequency, hematuria, incontinence, or discharge PSYCHIATRIC: Denies feelings of depression or anxiety. No report of hallucinations, insomnia, paranoia, or agitation   Immunization History  Administered Date(s) Administered  . Pneumococcal-Unspecified 09/09/2016   Pertinent  Health Maintenance Due  Topic Date Due  . FOOT EXAM  09/16/1961  . OPHTHALMOLOGY EXAM  09/16/1961  . URINE MICROALBUMIN  09/16/1961  . COLONOSCOPY  09/16/2001  . PNA vac Low Risk Adult (1 of 2 - PCV13) 09/09/2017  . INFLUENZA VACCINE  07/10/2018  . HEMOGLOBIN A1C  08/21/2018   Fall Risk  07/23/2014  Falls in the past year? Yes  Number falls in past yr: 2 or more  Risk Factor Category  High Fall Risk  Risk for fall due to : Mental status change      Vitals:   04/16/18 1607  BP: 130/68  Pulse: 62  Resp: 17  Temp: 97.7 F (36.5 C)  TempSrc: Oral  SpO2: 96%  Weight: 180 lb 12.8 oz (82 kg)  Height:  (1.727 m)   Body mass index is 27.49 kg/m.  Physical Exam  GENERAL APPEARANCE: Well nourished. In no acute distress. Normal body habitus SKIN:  Skin is warm and dry.  MOUTH and THROAT: Lips are without lesions. Oral mucosa is moist and without lesions. Tongue is normal in shape, size, and color and without  lesions RESPIRATORY: Breathing is even & unlabored, BS CTAB CARDIAC: RRR, + murmur,no extra heart sounds, no edema, left chest pacemaker GI: Abdomen soft, normal BS, no masses, no tenderness EXTREMITIES:  Able to move X 4 extremities PSYCHIATRIC: Alert to self, disoriented to time and place. Affect and behavior are appropriate   Labs reviewed: Recent Labs    10/19/17 0749  02/18/18 0810 02/20/18 0428 02/21/18 0624  NA 137   < > 138 137 141  K 3.6   < > 3.7 3.8 4.1  CL 104   < > 103 101 104  CO2 27   < > GLUCOSE 114*   < > 76 81 77  BUN 5*   < > CREATININE 1.04   < > 0.86 1.03 1.05  CALCIUM 8.5*   < > 9.0 8.7* 9.0  MG 1.9  --   --  1.9  --    < > = values in this interval not displayed.   Recent Labs    10/20/17 0358 02/17/18 1609 02/21/18 0624  AST 24 45* 30  ALT 20 49 40  ALKPHOS 72 81 81  BILITOT 1.0 1.8* 1.6*  PROT 5.1* 6.3* 5.7*  ALBUMIN 2.9* 3.9 3.1*   Recent Labs    02/17/18 1609 02/20/18 0428 02/21/18 0624 03/12/18  WBC 7.5 13.4* 9.5 6.7  NEUTROABS  --  10.2* 6.4 4  HGB 12.4* 12.0* 11.6* 13.8  HCT 36.4* 34.9* 33.9* 42  MCV 101.4* 100.6* 100.0  --   PLT 285 265 273 358   Lab Results  Component Value Date   TSH 1.370 04/26/2014   Lab Results  Component Value Date   HGBA1C 4.8 02/18/2018   Lab Results  Component Value Date   CHOL 94 02/18/2018   HDL 50 02/18/2018   LDLCALC 40 02/18/2018   TRIG 19 02/18/2018   CHOLHDL 1.9 02/18/2018    Assessment/Plan  1. History of CVA (cerebrovascular accident) - stable, continue rehabilitation with PT and OT, continue Eliquis 5 mg BID and ASA 81 mg daily, and Rosuvastatin 10 mg daily   2. Benign essential HTN - well-controlled, not on blood pressure medication   3. Hyperlipidemia, unspecified hyperlipidemia type - continue Rosuvastatin 10 mg daily Lab Results  Component Value Date   CHOL 94 02/18/2018   HDL 50 02/18/2018   LDLCALC 40 02/18/2018   TRIG 19 02/18/2018   CHOLHDL  1.9 02/18/2018     4. Depression, recurrent (HCC) - mood is stable, continue Escitalopram 10 mg Q HS   5. Slow transit constipation - continue Senna plus 2 tabs Q HS    Family/ staff Communication: Discussed plan of care with resident.  Labs/tests ordered:   None  Goals of care:   Short-term care   Kenard Gower, NP [facility] and Adult Medicine [phone] (Monday-Friday 8:00 a.m. - 5:00 p.m.) [phone] (after hours)

## 2018-04-17 LAB — MICROALBUMIN, URINE: Microalb, Ur: 1.2

## 2018-04-18 ENCOUNTER — Institutional Professional Consult (permissible substitution): Payer: Medicare Other | Admitting: Cardiovascular Disease

## 2018-04-21 ENCOUNTER — Encounter: Payer: Self-pay | Admitting: Adult Health

## 2018-04-21 NOTE — Progress Notes (Signed)
This encounter was created in error - please disregard.

## 2018-04-22 ENCOUNTER — Non-Acute Institutional Stay (SKILLED_NURSING_FACILITY): Payer: Medicare Other | Admitting: Adult Health

## 2018-04-22 ENCOUNTER — Encounter: Payer: Self-pay | Admitting: Adult Health

## 2018-04-22 DIAGNOSIS — I10 Essential (primary) hypertension: Secondary | ICD-10-CM | POA: Diagnosis not present

## 2018-04-22 DIAGNOSIS — K5901 Slow transit constipation: Secondary | ICD-10-CM | POA: Diagnosis not present

## 2018-04-22 DIAGNOSIS — E785 Hyperlipidemia, unspecified: Secondary | ICD-10-CM

## 2018-04-22 DIAGNOSIS — F339 Major depressive disorder, recurrent, unspecified: Secondary | ICD-10-CM

## 2018-04-22 DIAGNOSIS — Z8673 Personal history of transient ischemic attack (TIA), and cerebral infarction without residual deficits: Secondary | ICD-10-CM

## 2018-04-22 NOTE — Progress Notes (Signed)
Location:  [facility] Room Number: 110-B Place of Service:  SNF (31) Provider:  Kenard Gower, NP  Patient Care Team: [name], MD as PCP - General [facility] Medicine)  Extended Emergency Contact Information Primary Emergency Contact: [name],[name] Address: [address]          [place], [place] [address] [place] of [place] Home Phone: [phone] Mobile Phone: [phone] Relation: Daughter Secondary Emergency Contact: Hart [place] Mobile Phone: [phone] Relation: Relative  Code Status:   DNR  Goals of care: Advanced Directive information Advanced Directives 04/16/2018  Does Patient Have a Medical Advance Directive? Yes  Type of Advance Directive Out of facility DNR (pink MOST or yellow form)  Does patient want to make changes to medical advance directive? No - Patient declined  Would patient like information on creating a medical advance directive? No - Patient declined  Pre-existing out of facility DNR order (yellow form or pink MOST form) -     Chief Complaint  Patient presents with  . Discharge Note    Patient to transfer to an assisted living facility on 04/23/18    HPI:  Pt is a 67 y.o. male seen today for discharge.  He is transferring to an assisted living facility. He has a PMH of BPH, CAD, history of CVA and MI, and hyperlipidemia.    He has been admitted to [facility] on 03/07/18 from the hospital with a recent occipital stroke. MRI brain revealed acute large nonhemorrhagic temporal occipital lobe PCA territory infarcts, small acute posterior circulation and posterior watershed infarcts and old biparietal lobe infarction. TEE revealed severe aortic stenosis. Neurology recommended ASA and Plavix. He was then transitioned to Eliquis and decreased ASA to 81 mg daily.  Patient was admitted to this facility for short-term rehabilitation after the patient's recent  hospitalization.  Patient has completed SNF rehabilitation and therapy has cleared the patient for discharge.   Past Medical History:  Diagnosis Date  . At high risk for falls   . BPH (benign prostatic hypertrophy)    HX RETENTION  . Coronary artery disease CARDIOLOGIST--  DR Johney Frame   S/P  PCI TO LAD 1993  . Diabetes mellitus without complication (HCC)   . GERD (gastroesophageal reflux disease)   . History of CVA (cerebrovascular accident)    1993---  NO RESIDUALS  . History of head injury    CONCUSSION--  NO RESIDUAL  . History of myocardial infarction    1993--  NON-Q WAVE  S/P PCI TO LAD  . Hydrocele, left   . Hyperlipidemia   . Left carotid artery stenosis    MILD  . PSVT (paroxysmal supraventricular tachycardia) (HCC)   . PVD (peripheral vascular disease) (HCC)    S/P LEFT FEM-POP  . SBO (small bowel obstruction) (HCC)   . Stroke [facility])    Past Surgical History:  Procedure Laterality Date  . CARDIAC CATHETERIZATION  01-18-2003   DR Eden Emms   NON-OBSTRUCTIVE CAD/  PREVIOIS ANGIOPLASTY SITE WIDELY PATENT  . CARDIOVASCULAR STRESS TEST  2007   SMALL ANTERO-APICAL SCAR/  NO ISCHEMIA  . CAROTID ENDARTERECTOMY Right 05-31-2003  . CORONARY ANGIOPLASTY  1993   PCI TO LAD  . FEMORAL-POPLITEAL BYPASS GRAFT Left 01-19-2003  . HYDROCELE EXCISION Left 10/27/2013   Procedure: HYDROCELECTOMY ADULT;  Surgeon: Lindaann Slough, MD;  Location: [facility];  Service: Urology;  Laterality: Left;  . RIGHT HYDROCELECTOMY  05-31-2003  . TEE WITHOUT CARDIOVERSION N/A 02/19/2018  Procedure: TRANSESOPHAGEAL ECHOCARDIOGRAM (TEE);  Surgeon: Jake Bathe, MD;  Location: [facility] ENDOSCOPY;  Service: Cardiovascular;  Laterality: N/A;  . TRANSTHORACIC ECHOCARDIOGRAM  08-24-2010   EF 55%/  MILD AORTIC INSUFFICENCY/  MODERATE LVH  . UMBILICAL HERNIA REPAIR N/A 10/21/2017   Procedure: UMBILICAL HERNIA REPAIR;  Surgeon: Manus Rudd, MD;  Location: [facility] OR;  Service: General;  Laterality: N/A;     No Known Allergies  Outpatient Encounter Medications as of 04/22/2018  Medication Sig  . acetaminophen (TYLENOL) 325 MG tablet Take 2 tablets (650 mg total) by mouth every 4 (four) hours as needed for mild pain (or temp > 37.5 C (99.5 F)).  Marland Kitchen apixaban (ELIQUIS) 5 MG TABS tablet Take 1 tablet (5 mg total) by mouth 2 (two) times daily.  Marland Kitchen aspirin 81 MG EC tablet Take 1 tablet (81 mg total) by mouth daily.  Marland Kitchen escitalopram (LEXAPRO) 10 MG tablet Take 1 tablet (10 mg total) by mouth at bedtime.  Marland Kitchen NUTRITIONAL SUPPLEMENT LIQD Take 120 mLs by mouth 2 (two) times daily. MedPass  . rosuvastatin (CRESTOR) 10 MG tablet Take 1 tablet (10 mg total) by mouth daily.  Marland Kitchen senna-docusate (SENOKOT-S) 8.6-50 MG tablet Take 2 tablets by mouth at bedtime.   No facility-administered encounter medications on file as of 04/22/2018.     Review of Systems  GENERAL: No change in appetite, no fatigue, no weight changes, no fever, chills or weakness SKIN: Denies rash, itching, wounds, ulcer sores, or nail abnormalities MOUTH and THROAT: Denies oral discomfort, gingival pain or bleeding, pain from teeth or hoarseness   RESPIRATORY: no cough, SOB, DOE, wheezing, hemoptysis CARDIAC: No chest pain, or palpitations PSYCHIATRIC: Denies feelings of depression or anxiety. No report of hallucinations, insomnia, paranoia, or agitation   Immunization History  Administered Date(s) Administered  . Pneumococcal-Unspecified 09/09/2016   Pertinent  Health Maintenance Due  Topic Date Due  . FOOT EXAM  09/16/1961  . OPHTHALMOLOGY EXAM  09/16/1961  . COLONOSCOPY  09/16/2001  . PNA vac Low Risk Adult (1 of 2 - PCV13) 09/09/2017  . INFLUENZA VACCINE  07/10/2018  . HEMOGLOBIN A1C  08/21/2018  . URINE MICROALBUMIN  04/18/2019   Fall Risk  07/23/2014  Falls in the past year? Yes  Number falls in past yr: 2 or more  Risk Factor Category  High Fall Risk  Risk for fall due to : Mental status change      Vitals:   04/22/18  1315  BP: 130/71  Pulse: 76  Resp: 17  Temp: 97.7 F (36.5 C)  TempSrc: Oral  SpO2: 95%  Weight: 180 lb 12.8 oz (82 kg)  Height:  (1.727 m)   Body mass index is 27.49 kg/m.  Physical Exam  GENERAL APPEARANCE: Well nourished. In no acute distress. Normal body habitus SKIN:  Skin is warm and dry. MOUTH and THROAT: Lips are without lesions. Oral mucosa is moist and without lesions. Tongue is normal in shape, size, and color and without lesions RESPIRATORY: Breathing is even & unlabored, BS CTAB CARDIAC: RRR, no murmur,no extra heart sounds, no edema GI: Abdomen soft, normal BS, no masses EXTREMITIES:  Able to move X 4 extremities PSYCHIATRIC: Alert to self, disoriented to time and place. Affect and behavior are appropriate   Labs reviewed: Recent Labs    10/19/17 0749  02/18/18 0810 02/20/18 0428 02/21/18 0624  NA 137   < > 138 137 141  K 3.6   < > 3.7 3.8 4.1  CL 104   < >  103 101 104  CO2 27   < > GLUCOSE 114*   < > 76 81 77  BUN 5*   < > CREATININE 1.04   < > 0.86 1.03 1.05  CALCIUM 8.5*   < > 9.0 8.7* 9.0  MG 1.9  --   --  1.9  --    < > = values in this interval not displayed.   Recent Labs    10/20/17 0358 02/17/18 1609 02/21/18 0624  AST 24 45* 30  ALT 20 49 40  ALKPHOS 72 81 81  BILITOT 1.0 1.8* 1.6*  PROT 5.1* 6.3* 5.7*  ALBUMIN 2.9* 3.9 3.1*   Recent Labs    02/17/18 1609 02/20/18 0428 02/21/18 0624 03/12/18  WBC 7.5 13.4* 9.5 6.7  NEUTROABS  --  10.2* 6.4 4  HGB 12.4* 12.0* 11.6* 13.8  HCT 36.4* 34.9* 33.9* 42  MCV 101.4* 100.6* 100.0  --   PLT 285 265 273 358   Lab Results  Component Value Date   TSH 1.370 04/26/2014   Lab Results  Component Value Date   HGBA1C 4.8 02/18/2018   Lab Results  Component Value Date   CHOL 94 02/18/2018   HDL 50 02/18/2018   LDLCALC 40 02/18/2018   TRIG 19 02/18/2018   CHOLHDL 1.9 02/18/2018    Assessment/Plan  1. History of CVA (cerebrovascular accident) - recently  followed up with neurology on 04/08/18, continue ASA 81 mg daily, Rosuvastatin 10 mg daily, and Eliquis 5 mg 1 tab BID   2. Hyperlipidemia, unspecified hyperlipidemia type - continue Rosuvastatin 10 mg daily Lab Results  Component Value Date   CHOL 94 02/18/2018   HDL 50 02/18/2018   LDLCALC 40 02/18/2018   TRIG 19 02/18/2018   CHOLHDL 1.9 02/18/2018     3. Depression, recurrent (HCC) - continue Escitalopram 10 mg Q HS   4. Slow transit constipation - continue Senna plus 2 tabs Q HS   5. Benign essential hypertension - stable, not on any medications   I have filled out patient's discharge paperwork..  DME provided:  None  Total discharge time: Less than 30 minutes  Discharge time involved coordination of the discharge process with social worker, nursing staff and therapy department.    Kenard Gower, NP [facility] and Adult Medicine [phone] (Monday-Friday 8:00 a.m. - 5:00 p.m.) [phone] (after hours)

## 2018-04-29 ENCOUNTER — Encounter: Payer: Self-pay | Admitting: Internal Medicine

## 2018-04-29 ENCOUNTER — Other Ambulatory Visit: Payer: Self-pay | Admitting: Internal Medicine

## 2018-05-08 ENCOUNTER — Ambulatory Visit: Payer: Medicare Other | Admitting: Internal Medicine

## 2018-05-09 ENCOUNTER — Institutional Professional Consult (permissible substitution): Payer: Medicare Other | Admitting: Cardiovascular Disease

## 2018-05-12 ENCOUNTER — Telehealth: Payer: Self-pay | Admitting: Adult Health

## 2018-05-12 ENCOUNTER — Encounter: Payer: Self-pay | Admitting: Cardiovascular Disease

## 2018-05-12 NOTE — Telephone Encounter (Signed)
Rn call and spoke with Roxanna pts nurse at the facility. RN stated the daughter requested physical therapy for her father. Rn requested if a order form from their facility so Shanda BumpsJessica NP can filled it out. Roxanna stated they have a in house MD that can order PT for the patient. RN requested they call the pts daughter to informed of her that the inhouse MD can order therapy.

## 2018-05-12 NOTE — Telephone Encounter (Signed)
Pt daughter(on DPR)called to inform that there is a need to continue Physical Therapy.  Daughter also wanted it noted pt is at a new facility now.  [facility] [phone] Please call daughter

## 2018-05-21 ENCOUNTER — Ambulatory Visit: Payer: Medicare Other | Admitting: Internal Medicine

## 2018-05-22 ENCOUNTER — Encounter: Payer: Self-pay | Admitting: Internal Medicine

## 2018-06-10 ENCOUNTER — Telehealth: Payer: Self-pay | Admitting: Physician Assistant

## 2018-06-10 ENCOUNTER — Telehealth: Payer: Self-pay

## 2018-06-10 NOTE — Telephone Encounter (Signed)
Notes recorded by Hildred AlaminMurrell, Zena Vitelli Y, RN on 06/10/2018 at 10:14 AM EDT Rn call patients daughter [name] about cardiac monitor. Rn stated it was negative for any irregular heart beat, Continue treatment plan. Daughter verbalized understanding. ------

## 2018-06-10 NOTE — Telephone Encounter (Signed)
  [facility]   MULTIDISCIPLINARY HEART VALVE TEAM  The structural heart team was consulted about his severe AS after his 02/2018 admission. He had an appointment on 04/18/18 with Dr. Excell Seltzerooper. This was cancelled and rescheduled for 05/09/18. He did not show for this appointment. I called [name] today and left a voicemail about rescheduling. If we do not hear back from him we will remove him from our list.    We are happy to see him in the future if he decides that he would like to discuss TAVR.   Nicholas CrockKathryn Thompson PA-C  [facility]

## 2018-06-10 NOTE — Telephone Encounter (Signed)
-----   Message from Marvel PlanJindong Xu, MD sent at 06/09/2018  1:59 PM EDT ----- Could you please let the patient know that the heart monitoring test done recently was negative irregular heart beat. Please continue current treatment. Thanks.  Marvel PlanJindong Xu, MD PhD Stroke Neurology 06/09/2018 1:59 PM

## 2018-06-10 NOTE — Progress Notes (Signed)
Electrophysiology Office Note   Date:  06/11/2018   ID:  Nicholas [name], DOB 11/18/1951, MRN [id number]  PCP:  Leilani Ableeese, Betti, MD  Cardiologist:  Mayford Knifeurner Primary Electrophysiologist:  Kayana Thoen Jorja LoaMartin Kaoru Rezendes, MD    Chief Complaint  Patient presents with  . Advice Only    PSVT/PAF     History of Present Illness: Nicholas [name] is a [age] y.o. male who is being seen today for the evaluation of CVA at the request of Nicholas [name]. Presenting today for electrophysiology evaluation.  He has a history of severe aortic stenosis, CVA, SVT, hypertension, hyperlipidemia, coronary disease.  There apparently notes in the chart discussing both atypical atrial flutter and atrial fibrillation, though no EKGs have shown this.  He had a stroke on 02/17/2018.  His MRI showed a left large PCA infarct, bilateral cerebellar punctate infarcts in bilateral ACA/PCA infarcts, as well as an old bilateral MCA and ACA infarct.  He has a right hemianopsia, disorientation, left foot DF/PF deficit.  He wore a cardiac monitor which showed no evidence of atrial fibrillation.  He uses a wheelchair for long distances but short distances he uses a walker.   Today, he denies symptoms of palpitations, chest pain, shortness of breath, orthopnea, PND, lower extremity edema, claudication, dizziness, presyncope, syncope, bleeding. The patient is tolerating medications without difficulties.  Overall, he is about the same as he was after leaving the hospital.  He does not feel that he is improved very much.  He is not excited about further monitoring, but Kaelani Kendrick think about this.   Past Medical History:  Diagnosis Date  . At high risk for falls   . BPH (benign prostatic hypertrophy)    HX RETENTION  . Coronary artery disease CARDIOLOGIST--  DR Johney FrameALLRED   S/P  PCI TO LAD 1993  . Diabetes mellitus without complication (HCC)   . GERD (gastroesophageal reflux disease)   . History of CVA (cerebrovascular accident)    1993---  NO RESIDUALS    . History of head injury    CONCUSSION--  NO RESIDUAL  . History of myocardial infarction    1993--  NON-Q WAVE  S/P PCI TO LAD  . Hydrocele, left   . Hyperlipidemia   . Left carotid artery stenosis    MILD  . PSVT (paroxysmal supraventricular tachycardia) (HCC)   . PVD (peripheral vascular disease) (HCC)    S/P LEFT FEM-POP  . SBO (small bowel obstruction) (HCC)   . Stroke [facility](HCC)    Past Surgical History:  Procedure Laterality Date  . CARDIAC CATHETERIZATION  01-18-2003   DR Eden EmmsNISHAN   NON-OBSTRUCTIVE CAD/  PREVIOIS ANGIOPLASTY SITE WIDELY PATENT  . CARDIOVASCULAR STRESS TEST  2007   SMALL ANTERO-APICAL SCAR/  NO ISCHEMIA  . CAROTID ENDARTERECTOMY Right 05-31-2003  . CORONARY ANGIOPLASTY  1993   PCI TO LAD  . FEMORAL-POPLITEAL BYPASS GRAFT Left 01-19-2003  . HYDROCELE EXCISION Left 10/27/2013   Procedure: HYDROCELECTOMY ADULT;  Surgeon: Lindaann SloughMarc-Henry Nesi, MD;  [facility];  Service: Urology;  Laterality: Left;  . RIGHT HYDROCELECTOMY  05-31-2003  . TEE WITHOUT CARDIOVERSION N/A 02/19/2018   Procedure: TRANSESOPHAGEAL ECHOCARDIOGRAM (TEE);  Surgeon: Jake BatheSkains, Mark C, MD;  [facility];  Service: Cardiovascular;  Laterality: N/A;  . TRANSTHORACIC ECHOCARDIOGRAM  08-24-2010   EF 55%/  MILD AORTIC INSUFFICENCY/  MODERATE LVH  . UMBILICAL HERNIA REPAIR N/A 10/21/2017   Procedure: UMBILICAL HERNIA REPAIR;  Surgeon: Manus Ruddsuei, Matthew, MD;  Location: [facility] OR;  Service: General;  Laterality: N/A;     Current Outpatient Medications  Medication Sig Dispense Refill  . acetaminophen (TYLENOL) 325 MG tablet Take 2 tablets (650 mg total) by mouth every 4 (four) hours as needed for mild pain (or temp > 37.5 C (99.5 F)).    Marland Kitchen aspirin 81 MG EC tablet Take 1 tablet (81 mg total) by mouth daily.    Marland Kitchen ELIQUIS 5 MG TABS tablet TAKE 1 TABLET BY MOUTH TWICE DAILY 20 tablet 10  . escitalopram (LEXAPRO) 10 MG tablet Take 1 tablet (10 mg total) by mouth at bedtime.    Marland Kitchen  NUTRITIONAL SUPPLEMENT LIQD Take 120 mLs by mouth 2 (two) times daily. MedPass    . rosuvastatin (CRESTOR) 10 MG tablet Take 1 tablet (10 mg total) by mouth daily. 30 tablet 0  . senna-docusate (SENOKOT-S) 8.6-50 MG tablet Take 2 tablets by mouth at bedtime.     No current facility-administered medications for this visit.     Allergies:   Patient has no known allergies.   Social History:  The patient  reports that he has quit smoking. His smoking use included cigarettes. He quit after 0.00 years of use. He has never used smokeless tobacco. He reports that he does not drink alcohol or use drugs.   Family History:  The patient's family history includes Hypertension in his father and mother; Stroke in his father and mother.    ROS:  Please see the history of present illness.   Otherwise, review of systems is positive for none.   All other systems are reviewed and negative.    PHYSICAL EXAM: VS:  BP (!) 120/56   Pulse 92   Ht 5\' 11"  (1.803 m)   Wt 170 lb 9.6 oz (77.4 kg)   BMI 23.79 kg/m  , BMI Body mass index is 23.79 kg/m. GEN: Well nourished, well developed, in no acute distress  HEENT: normal  Neck: no JVD, carotid bruits, or masses Cardiac: RRR; 2/6 systolic murmurs, no rubs, or gallops,no edema  Respiratory:  clear to auscultation bilaterally, normal work of breathing GI: soft, nontender, nondistended, + BS MS: no deformity or atrophy  Skin: warm and dry Neuro:  Strength and sensation are intact Psych: euthymic mood, full affect  EKG:  EKG is ordered today. Personal review of the ekg ordered shows SR, LVH by voltage, PAC, rate 92  Recent Labs: 02/20/2018: Magnesium 1.9 02/21/2018: ALT 40; BUN 15; Creatinine, Ser 1.05; Potassium 4.1; Sodium 141 03/12/2018: Hemoglobin 13.8; Platelets 358    Lipid Panel     Component Value Date/Time   CHOL 94 02/18/2018 0549   TRIG 19 02/18/2018 0549   HDL 50 02/18/2018 0549   CHOLHDL 1.9 02/18/2018 0549   VLDL 4 02/18/2018 0549    LDLCALC 40 02/18/2018 0549     Wt Readings from Last 3 Encounters:  06/11/18 170 lb 9.6 oz (77.4 kg)  04/22/18 180 lb 12.8 oz (82 kg)  04/21/18 180 lb 12.8 oz (82 kg)      Other studies Reviewed: Additional studies/ records that were reviewed today include: TTE 02/18/18  Review of the above records today demonstrates:  - Left ventricle: The cavity size was normal. Wall thickness was   increased in a pattern of moderate LVH. Systolic function was   normal. The estimated ejection fraction was in the range of 60%   to 65%. Wall motion was normal; there were no regional wall   motion abnormalities. Doppler parameters are consistent with   abnormal left  ventricular relaxation (grade 1 diastolic   dysfunction). - Aortic valve: Functionally bicuspid; severely thickened, severely   calcified leaflets. Valve mobility was restricted. There was   severe stenosis. There was mild regurgitation. Valve area (VTI):   0.81 cm^2. Valve area (Vmax): 0.75 cm^2. Valve area (Vmean): 0.87   cm^2. - Mitral valve: Mild prolapse, involving the anterior leaflet. - Left atrium: The atrium was mildly dilated.   ASSESSMENT AND PLAN:  1.  Cryptogenic stroke: At this point, no causes been found.  He has had a 30-day monitor as well as a TEE without signs of embolic phenomenon.  Due to that, I discussed with him possibility of link implant.  Risks and benefits were discussed.  Risks include bleeding and infection.  I discussed this with both the patient and his daughter.  They would like to consider their options further.  He is not sure whether or not he wants a monitor implanted.  We Makel Mcmann give them him further information on link monitoring.  2.  Severe aortic stenosis: Had a plan for evaluation for valve replacement.  His daughter Montserrat Shek discuss this with him as to whether or not he would like to explore valve replacement.  3.  Hypertension: Well-controlled.  No changes.  4.  Hyperlipidemia: Continue  Crestor.     Current medicines are reviewed at length with the patient today.   The patient does not have concerns regarding his medicines.  The following changes were made today:  none  Labs/ tests ordered today include:  No orders of the defined types were placed in this encounter.    Disposition:   FU with Caprina Wussow as needed pending length decision  Signed, Alyssabeth Bruster Jorja Loa, MD  06/11/2018 9:40 AM     [facility] [address] [place] [address] [phone] (office) [phone] (fax)

## 2018-06-11 ENCOUNTER — Encounter: Payer: Self-pay | Admitting: Cardiology

## 2018-06-11 ENCOUNTER — Encounter (INDEPENDENT_AMBULATORY_CARE_PROVIDER_SITE_OTHER): Payer: Self-pay

## 2018-06-11 ENCOUNTER — Ambulatory Visit (INDEPENDENT_AMBULATORY_CARE_PROVIDER_SITE_OTHER): Payer: Medicare Other | Admitting: Cardiology

## 2018-06-11 VITALS — BP 120/56 | HR 92 | Ht 71.0 in | Wt 170.6 lb

## 2018-06-11 DIAGNOSIS — I10 Essential (primary) hypertension: Secondary | ICD-10-CM | POA: Diagnosis not present

## 2018-06-11 DIAGNOSIS — I350 Nonrheumatic aortic (valve) stenosis: Secondary | ICD-10-CM

## 2018-06-11 DIAGNOSIS — E785 Hyperlipidemia, unspecified: Secondary | ICD-10-CM

## 2018-06-11 DIAGNOSIS — I639 Cerebral infarction, unspecified: Secondary | ICD-10-CM

## 2018-06-11 NOTE — Patient Instructions (Addendum)
Medication Instructions:  Your physician recommends that you continue on your current medications as directed. Please refer to the Current Medication list given to you today.  Labwork: None ordered  Testing/Procedures: None ordered  Follow-Up: To be determined   * If you need a refill on your cardiac medications before your next appointment, please call your pharmacy.   *Please note that any paperwork needing to be filled out by the provider will need to be addressed at the front desk prior to seeing the provider. Please note that any FMLA, disability or other documents regarding health condition is subject to a $25.00 charge that must be received prior to completion of paperwork in the form of a money order or check.  Thank you for choosing [facility]!!   Nicholas HornSherri Ieesha Abbasi, RN [phone]  Any Other Special Instructions Will Be Listed Below (If Applicable). Please call the office if/when you decide on loop recorder. Please call the office if you decide you would like a referral for your aortic stenosis.  Implantable Loop Recorder Placement An implantable loop recorder is a small electronic device that is placed under the skin of your chest. It is about the size of an AA ("double A") battery. The device records the electrical activity of your heart over a long period of time. Your health care provider can download these recordings to monitor your heart. You may need an implantable loop recorder if you have periods of abnormal heart activity (arrhythmias) or unexplained fainting (syncope) caused by a heart problem. Tell a health care provider about:  Any allergies you have.  All medicines you are taking, including vitamins, herbs, eye drops, creams, and over-the-counter medicines.  Any problems you or family members have had with anesthetic medicines.  Any blood disorders you have.  Any surgeries you have had.  Any medical conditions you have.  Whether you are pregnant or  may be pregnant. What are the risks? Generally, this is a safe procedure. However, as with any procedure, problems may occur, including:  Infection.  Bleeding.  Allergic reactions to anesthetic medicines.  Damage to nerves or blood vessels.  Failure of the device to work. This could require another surgery to replace it.  What happens before the procedure?   You may have a physical exam, blood tests, and imaging tests of your heart, such as a chest X-ray.  Follow instructions from your health care provider about eating or drinking restrictions.  Ask your health care provider about: ? Changing or stopping your regular medicines. This is especially important if you are taking diabetes medicines or blood thinners. ? Taking medicines such as aspirin and ibuprofen. These medicines can thin your blood. Do not take these medicines before your procedure if your surgeon instructs you not to.  Ask your health care provider how your surgical site will be marked or identified.  You may be given antibiotic medicine to help prevent infection.  Plan to have someone take you home after the procedure.  If you will be going home right after the procedure, plan to have someone with you for 24 hours.  Do not use any tobacco products, such as cigarettes, chewing tobacco, and e-cigarettes as told by your surgeon. If you need help quitting, ask your health care provider. What happens during the procedure?  To reduce your risk of infection: ? Your health care team will wash or sanitize their hands. ? Your skin will be washed with soap.  An IV tube will be inserted into one  of your veins.  You may be given an antibiotic medicine through the IV tube.  You may be given one or more of the following: ? A medicine to help you relax (sedative). ? A medicine to numb the area (local anesthetic).  A small cut (incision) will be made on the left side of your upper chest.  A pocket will be created  under your skin.  The device will be placed in the pocket.  The incision will be closed with stitches (sutures) or adhesive strips.  A bandage (dressing) will be placed over the incision. The procedure may vary among health care providers and hospitals. What happens after the procedure?  Your blood pressure, heart rate, breathing rate, and blood oxygen level will be monitored often until the medicines you were given have worn off.  You may be able to go home on the day of your surgery. Before going home: ? Your health care provider will program your recorder. ? You will learn how to trigger your device with a handheld activator. ? You will learn how to send recordings to your health care provider. ? You will get an ID card for your device, and you will be told when to use it.  Do not drive for 24 hours if you received a sedative. This information is not intended to replace advice given to you by your health care provider. Make sure you discuss any questions you have with your health care provider. Document Released: 11/07/2015 Document Revised: 05/03/2016 Document Reviewed: 08/31/2015 Elsevier Interactive Patient Education  [employer].

## 2018-06-17 NOTE — Addendum Note (Signed)
Addended by: Baird LyonsPRICE, Jule Whitsel L on: 06/17/2018 02:37 PM   Modules accepted: Orders

## 2018-07-16 ENCOUNTER — Encounter: Payer: Self-pay | Admitting: Cardiovascular Disease

## 2018-07-23 ENCOUNTER — Encounter: Payer: Self-pay | Admitting: Adult Health

## 2018-07-23 ENCOUNTER — Ambulatory Visit: Payer: Medicare Other | Admitting: Adult Health

## 2018-07-23 NOTE — Progress Notes (Deleted)
[facility] [address] [place]. [place] [id number] [phone]       OFFICE FOLLOW UP NOTE  [name] Date of Birth:  08/10/1951 Medical Record Number:  [id number]   Reason for Referral:  hospital stroke follow up  CHIEF COMPLAINT:  No chief complaint on file.   HPI: Nicholas [name] is being seen today in the office for left PCA stroke on 02/17/18. History obtained from patient and chart review. Reviewed all radiology images and labs personally.  Nicholas [name] is a 67 year old male with history of right carotid artery disease status post right CEA, left carotid stenosis, CAD/MI status post PCI in 1983, CVA 1983, HLD, HTN, DM, paroxysmal SVT, PVD status post bypass admitted for confusion. MRI showed left large PCA infarct, b/l cerebellar punctate infarcts and b/l distal ACA/PCA infarcts, as well as old b/l MCA and ACA infarcts. CTA head and neck showed left P2 occlusion, chronic right CCA/ICA occlusion and left CCA/ICA 50% stenosis. EF 60-65%, LDL 40 and A1C 5.4. Pt stroke cardioembolic pattern, given hx of PSVT on digoxin and cardizem at home, highly suspicious for PAF, TEE unremarkable. EP saw pt and recommend 30 day cardiac event monitoring first and if negative, then consider loop recorder. Continue to have right hemianopia, disorientation, left foot DF/PF deficit. He was on plavix and crestor at home, ASA 325mg  added into the regimen due to intracranial stenosis. Continue DAPT for 3 months and then plavix alone. Prior to discharge, it was recommended that patient start Eliquis and continue aspirin 81mg  for possible history of atrial fibrillation according to past documentation9i. Recommended to continue crestor at current dose. PT/OT recommended SNF placement.  04/08/18 visit: Since discharge, patient has been doing well.  [facility] where he receives PT/OT.  Continues to have right homonymous hemianopia with mild  improvement.  He did have a set back in memory after the stroke with complaints of short-term memory loss but this does continue to improve per daughter.  He is currently sitting in wheelchair for which he uses for long distance only.  He typically uses rolling walker and gait belt with a one assist for ambulation.  Blood pressure today satisfactory at 140/58.  Continues to take Eliquis and aspirin without side effects of bleeding or bruising.  Continues to take Crestor without side effects of myalgias.  He will be obtaining cardiac monitor tomorrow but daughter is afraid he will not tolerate this and continue to remove.  Interval History 07/23/18:   ROS:   14 system review of systems performed and negative with exception of double vision, loss of vision, cold intolerance, abdominal pain, frequency of urination, urgency, itching, memory loss, confusion, depression  PMH:  Past Medical History:  Diagnosis Date  . At high risk for falls   . BPH (benign prostatic hypertrophy)    HX RETENTION  . Coronary artery disease CARDIOLOGIST--  DR Johney FrameALLRED   S/P  PCI TO LAD 1993  . Diabetes mellitus without complication (HCC)   . GERD (gastroesophageal reflux disease)   . History of CVA (cerebrovascular accident)    1993---  NO RESIDUALS  . History of head injury    CONCUSSION--  NO RESIDUAL  . History of myocardial infarction    1993--  NON-Q WAVE  S/P PCI TO LAD  . Hydrocele, left   . Hyperlipidemia   . Left carotid artery stenosis    MILD  . PSVT (paroxysmal supraventricular tachycardia) (HCC)   .  PVD (peripheral vascular disease) (HCC)    S/P LEFT FEM-POP  . SBO (small bowel obstruction) (HCC)   . Stroke [facility])     PSH:  Past Surgical History:  Procedure Laterality Date  . CARDIAC CATHETERIZATION  01-18-2003   DR Eden Emms   NON-OBSTRUCTIVE CAD/  PREVIOIS ANGIOPLASTY SITE WIDELY PATENT  . CARDIOVASCULAR STRESS TEST  2007   SMALL ANTERO-APICAL SCAR/  NO ISCHEMIA  . CAROTID ENDARTERECTOMY Right  05-31-2003  . CORONARY ANGIOPLASTY  1993   PCI TO LAD  . FEMORAL-POPLITEAL BYPASS GRAFT Left 01-19-2003  . HYDROCELE EXCISION Left 10/27/2013   Procedure: HYDROCELECTOMY ADULT;  Surgeon: Lindaann Slough, MD;  Location: [facility];  Service: Urology;  Laterality: Left;  . RIGHT HYDROCELECTOMY  05-31-2003  . TEE WITHOUT CARDIOVERSION N/A 02/19/2018   Procedure: TRANSESOPHAGEAL ECHOCARDIOGRAM (TEE);  Surgeon: Jake Bathe, MD;  Location: [facility] ENDOSCOPY;  Service: Cardiovascular;  Laterality: N/A;  . TRANSTHORACIC ECHOCARDIOGRAM  08-24-2010   EF 55%/  MILD AORTIC INSUFFICENCY/  MODERATE LVH  . UMBILICAL HERNIA REPAIR N/A 10/21/2017   Procedure: UMBILICAL HERNIA REPAIR;  Surgeon: Manus Rudd, MD;  Location: [facility] OR;  Service: General;  Laterality: N/A;    Social History:  Social History   Socioeconomic History  . Marital status: Legally Separated    Spouse name: Not on file  . Number of children: Not on file  . Years of education: Not on file  . Highest education level: Not on file  Occupational History  . Not on file  Social Needs  . Financial resource strain: Not on file  . Food insecurity:    Worry: Not on file    Inability: Not on file  . Transportation needs:    Medical: Not on file    Non-medical: Not on file  Tobacco Use  . Smoking status: Former Smoker    Years: 0.00    Types: Cigarettes  . Smokeless tobacco: Never Used  . Tobacco comment: very seldom   Substance and Sexual Activity  . Alcohol use: No  . Drug use: No  . Sexual activity: Not on file  Lifestyle  . Physical activity:    Days per week: Not on file    Minutes per session: Not on file  . Stress: Not on file  Relationships  . Social connections:    Talks on phone: Not on file    Gets together: Not on file    Attends religious service: Not on file    Active member of club or organization: Not on file    Attends meetings of clubs or organizations: Not on file    Relationship status:  Not on file  . Intimate partner violence:    Fear of current or ex partner: Not on file    Emotionally abused: Not on file    Physically abused: Not on file    Forced sexual activity: Not on file  Other Topics Concern  . Not on file  Social History Narrative   Patient lives alone.  He has a daughter and brother who live nearby and check on him.    Family History:  Family History  Problem Relation Age of Onset  . Stroke Mother   . Hypertension Mother   . Hypertension Father   . Stroke Father     Medications:   Current Outpatient Medications on File Prior to Visit  Medication Sig Dispense Refill  . acetaminophen (TYLENOL) 325 MG tablet Take 2 tablets (650 mg total) by mouth  every 4 (four) hours as needed for mild pain (or temp > 37.5 C (99.5 F)).    Marland Kitchen aspirin 81 MG EC tablet Take 1 tablet (81 mg total) by mouth daily.    Marland Kitchen ELIQUIS 5 MG TABS tablet TAKE 1 TABLET BY MOUTH TWICE DAILY 20 tablet 10  . escitalopram (LEXAPRO) 10 MG tablet Take 1 tablet (10 mg total) by mouth at bedtime.    Marland Kitchen NUTRITIONAL SUPPLEMENT LIQD Take 120 mLs by mouth 2 (two) times daily. MedPass    . rosuvastatin (CRESTOR) 10 MG tablet Take 1 tablet (10 mg total) by mouth daily. 30 tablet 0  . senna-docusate (SENOKOT-S) 8.6-50 MG tablet Take 2 tablets by mouth at bedtime.     No current facility-administered medications on file prior to visit.     Allergies:  No Known Allergies   Physical Exam  There were no vitals filed for this visit. There is no height or weight on file to calculate BMI. No exam data present  General: well developed, well nourished, seated, in no evident distress Head: head normocephalic and atraumatic.   Neck: supple with no carotid or supraclavicular bruits Cardiovascular: regular rate and rhythm, no murmurs Musculoskeletal: no deformity Skin:  no rash/petichiae Vascular:  Normal pulses all extremities  Neurologic Exam Mental Status: Awake and fully alert. Oriented to place  and time. Recent and remote memory intact. Attention span, concentration and fund of knowledge appropriate. Mood and affect appropriate.  Cranial Nerves: Fundoscopic exam reveals sharp disc margins. Pupils equal, briskly reactive to light. Extraocular movements full without nystagmus. Visual fields full to confrontation. Hearing intact. Facial sensation intact. Face, tongue, palate moves normally and symmetrically.  Motor: Normal bulk and tone. Normal strength in all tested extremity muscles. Sensory.: intact to touch , pinprick , position and vibratory sensation.  Coordination: Rapid alternating movements normal in all extremities. Finger-to-nose and heel-to-shin performed accurately bilaterally. Gait and Station: Arises from chair without difficulty. Stance is normal. Gait demonstrates normal stride length and balance . Able to heel, toe and tandem walk without difficulty.  Reflexes: 1+ and symmetric. Toes downgoing.    NIHSS  3 Modified Rankin  4   Diagnostic Data (Labs, Imaging, Testing)  MR BRAIN WO CONTRAST 02/17/18 IMPRESSION: 1. Acute large nonhemorrhagic temporal occipital lobe/PCA territory infarct. Additional small acute posterior circulation and posterior watershed infarcts. 2. Old bifrontal and biparietal lobe infarcts in ACA and MCA territories. 3. Chronically occluded RIGHT internal carotid artery. 4. Critical Value/emergent results were called by telephone at the time of interpretation on 02/17/2018 at 8:25 pm to Dr. Azalia Bilis , who verbally acknowledged these results.  CT ANGIO HEAD W OR WO CONTRAST CT ANGIO NECK W OR WO CONTRAST 02/17/18 IMPRESSION: 1. Left posterior cerebral artery P2 segment occlusion. 2. Stable right common carotid and internal carotid artery occlusion to the terminus. Patent right ICA terminus. 3. Otherwise patent anterior and posterior intracranial circulation without large vessel occlusion, aneurysm, stenosis, or vascular malformation. 4.  Patent left carotid system. Calcified plaque of left carotid bifurcation with mild 50% distal CCA stenosis. 5. Patent right dominant vertebrobasilar system. No significant stenosis, aneurysm, or dissection. 6. Acute infarcts in cerebellum and left PCA distribution as seen on MRI. No acute hemorrhage. 7. Multiple chronic infarcts, parenchymal volume loss, and chronic microvascular ischemic changes of the brain as above. 8. Cervical spondylosis and OPLL with moderate to severe C5-6 canal Stenosis.  2D ECHOCARDIOGRAM 02/18/18 Study Conclusions - Left ventricle: The cavity size was normal. Wall thickness  was   increased in a pattern of moderate LVH. Systolic function was   normal. The estimated ejection fraction was in the range of 60%   to 65%. Wall motion was normal; there were no regional wall   motion abnormalities. Doppler parameters are consistent with   abnormal left ventricular relaxation (grade 1 diastolic   dysfunction). - Aortic valve: Functionally bicuspid; severely thickened, severely   calcified leaflets. Valve mobility was restricted. There was   severe stenosis. There was mild regurgitation. Valve area (VTI):   0.81 cm^2. Valve area (Vmax): 0.75 cm^2. Valve area (Vmean): 0.87   cm^2. - Mitral valve: Mild prolapse, involving the anterior leaflet. - Left atrium: The atrium was mildly dilated.  ECHO TEE Study Conclusions - Left ventricle: The cavity size was normal. Wall thickness was   normal. Systolic function was normal. The estimated ejection   fraction was in the range of 60% to 65%. Wall motion was normal;   there were no regional wall motion abnormalities. - Aortic valve: Valve mobility was restricted. There was severe   stenosis. There was moderate regurgitation. Peak velocity (S):   529 cm/s. Mean gradient (S): 46 mm Hg. - Mitral valve: No evidence of vegetation. - Left atrium: No evidence of thrombus in the appendage. - Right atrium: No evidence of thrombus  in the atrial cavity or   appendage. - Atrial septum: No defect or patent foramen ovale was identified.   Echo contrast study showed no right-to-left atrial level shunt,   following an increase in RA pressure induced by provocative   maneuvers. Echo contrast study showed no right-to-left atrial   level shunt, at baseline or with provocation. - Tricuspid valve: No evidence of vegetation. - Pulmonic valve: No evidence of vegetation. - Superior vena cava: The study excluded a thrombus.      ASSESSMENT: Nicholas [name] is a 67 y.o. year old male here with left PCA stroke on 02/17/18 secondary to likely cardioembolic vs small vessel disease. Vascular risk factors include HTN, HLD, DM and CAD.     PLAN: -Continue aspirin 81 mg daily and Eliquis (apixaban) daily  and crestor  for secondary stroke prevention -F/u with PCP regarding your HLD, HTN, DM and CAD management -continue to monitor BP at home -continue PT/OT - if additional orders are needed we will place them -cardiac monitor will be placed tomorrow -Maintain strict control of hypertension with blood pressure goal below 130/90, diabetes with hemoglobin A1c goal below 6.5% and cholesterol with LDL cholesterol (bad cholesterol) goal below 70 mg/dL. I also advised the patient to eat a healthy diet with plenty of whole grains, cereals, fruits and vegetables, exercise regularly and maintain ideal body weight.  Follow up in 4 months or call earlier if needed   Greater than 50% time during this 25 minute consultation visit was spent on counseling and coordination of care about HTN, HLD, DM and CAD, discussion about risk benefit of anticoagulation and answering questions.     George HughJessica Siarra Gilkerson, AGNP-BC  [facility] [address] [place], [place] [id number]  Phone [phone] Fax [phone]

## 2018-07-24 ENCOUNTER — Telehealth: Payer: Self-pay

## 2018-07-24 NOTE — Telephone Encounter (Signed)
Patient no show for appt on 07/23/2018.

## 2019-08-27 ENCOUNTER — Other Ambulatory Visit: Payer: Self-pay | Admitting: Internal Medicine

## 2019-08-27 DIAGNOSIS — F29 Unspecified psychosis not due to a substance or known physiological condition: Secondary | ICD-10-CM

## 2019-09-04 ENCOUNTER — Other Ambulatory Visit: Payer: Self-pay | Admitting: Family Medicine

## 2019-09-04 ENCOUNTER — Ambulatory Visit
Admission: RE | Admit: 2019-09-04 | Discharge: 2019-09-04 | Disposition: A | Discharge status: Home / Self Care | Payer: Medicare Other | Source: Ambulatory Visit | Attending: Internal Medicine | Admitting: Internal Medicine

## 2019-09-04 DIAGNOSIS — F29 Unspecified psychosis not due to a substance or known physiological condition: Secondary | ICD-10-CM

## 2019-11-10 DEATH — deceased
